# Patient Record
Sex: Female | Born: 1945 | Race: White | Hispanic: No | Smoking: Former smoker
Health system: Southern US, Community
[De-identification: ages and names within clinical notes are randomized; demographics above are authoritative.]

## PROBLEM LIST (undated history)

## (undated) DIAGNOSIS — Z923 Personal history of irradiation: Secondary | ICD-10-CM

## (undated) DIAGNOSIS — I10 Essential (primary) hypertension: Secondary | ICD-10-CM

## (undated) DIAGNOSIS — D126 Benign neoplasm of colon, unspecified: Secondary | ICD-10-CM

## (undated) DIAGNOSIS — E785 Hyperlipidemia, unspecified: Secondary | ICD-10-CM

## (undated) DIAGNOSIS — M8580 Other specified disorders of bone density and structure, unspecified site: Secondary | ICD-10-CM

## (undated) DIAGNOSIS — K219 Gastro-esophageal reflux disease without esophagitis: Secondary | ICD-10-CM

## (undated) DIAGNOSIS — I270 Primary pulmonary hypertension: Secondary | ICD-10-CM

## (undated) DIAGNOSIS — R112 Nausea with vomiting, unspecified: Secondary | ICD-10-CM

## (undated) DIAGNOSIS — I2510 Atherosclerotic heart disease of native coronary artery without angina pectoris: Secondary | ICD-10-CM

## (undated) DIAGNOSIS — I071 Rheumatic tricuspid insufficiency: Secondary | ICD-10-CM

## (undated) DIAGNOSIS — T7840XA Allergy, unspecified, initial encounter: Secondary | ICD-10-CM

## (undated) DIAGNOSIS — Z9889 Other specified postprocedural states: Secondary | ICD-10-CM

## (undated) DIAGNOSIS — Z85820 Personal history of malignant melanoma of skin: Secondary | ICD-10-CM

## (undated) DIAGNOSIS — C50919 Malignant neoplasm of unspecified site of unspecified female breast: Secondary | ICD-10-CM

## (undated) DIAGNOSIS — Z5189 Encounter for other specified aftercare: Secondary | ICD-10-CM

## (undated) DIAGNOSIS — K317 Polyp of stomach and duodenum: Secondary | ICD-10-CM

## (undated) DIAGNOSIS — M1990 Unspecified osteoarthritis, unspecified site: Secondary | ICD-10-CM

## (undated) HISTORY — DX: Hyperlipidemia, unspecified: E78.5

## (undated) HISTORY — DX: Rheumatic tricuspid insufficiency: I07.1

## (undated) HISTORY — PX: OTHER SURGICAL HISTORY: SHX169

## (undated) HISTORY — DX: Primary pulmonary hypertension: I27.0

## (undated) HISTORY — DX: Encounter for other specified aftercare: Z51.89

## (undated) HISTORY — PX: TONSILLECTOMY AND ADENOIDECTOMY: SUR1326

## (undated) HISTORY — PX: TUBAL LIGATION: SHX77

## (undated) HISTORY — DX: Allergy, unspecified, initial encounter: T78.40XA

## (undated) HISTORY — DX: Unspecified osteoarthritis, unspecified site: M19.90

## (undated) HISTORY — DX: Polyp of stomach and duodenum: K31.7

## (undated) HISTORY — DX: Gastro-esophageal reflux disease without esophagitis: K21.9

## (undated) HISTORY — DX: Malignant neoplasm of unspecified site of unspecified female breast: C50.919

## (undated) HISTORY — PX: EYE SURGERY: SHX253

## (undated) HISTORY — PX: WISDOM TOOTH EXTRACTION: SHX21

## (undated) HISTORY — DX: Other specified disorders of bone density and structure, unspecified site: M85.80

## (undated) HISTORY — DX: Personal history of malignant melanoma of skin: Z85.820

## (undated) HISTORY — DX: Benign neoplasm of colon, unspecified: D12.6

## (undated) HISTORY — DX: Essential (primary) hypertension: I10

---
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
## 1988-09-12 DIAGNOSIS — T7840XA Allergy, unspecified, initial encounter: Secondary | ICD-10-CM

## 1988-09-12 HISTORY — DX: Allergy, unspecified, initial encounter: T78.40XA

## 1988-09-12 HISTORY — PX: ABDOMINAL HYSTERECTOMY: SHX81

## 1988-09-12 HISTORY — PX: BLADDER SUSPENSION: SHX72

## 1999-09-13 HISTORY — PX: MENISCUS REPAIR: SHX5179

## 2010-09-12 DIAGNOSIS — C439 Malignant melanoma of skin, unspecified: Secondary | ICD-10-CM

## 2010-09-12 HISTORY — DX: Malignant melanoma of skin, unspecified: C43.9

## 2014-09-12 HISTORY — PX: COLONOSCOPY: SHX174

## 2015-02-24 DIAGNOSIS — D229 Melanocytic nevi, unspecified: Secondary | ICD-10-CM

## 2015-02-24 HISTORY — DX: Melanocytic nevi, unspecified: D22.9

## 2016-10-05 DIAGNOSIS — M79604 Pain in right leg: Secondary | ICD-10-CM | POA: Diagnosis not present

## 2016-10-05 DIAGNOSIS — M25569 Pain in unspecified knee: Secondary | ICD-10-CM | POA: Diagnosis not present

## 2016-10-05 DIAGNOSIS — M1711 Unilateral primary osteoarthritis, right knee: Secondary | ICD-10-CM | POA: Diagnosis not present

## 2016-10-05 DIAGNOSIS — M179 Osteoarthritis of knee, unspecified: Secondary | ICD-10-CM | POA: Diagnosis not present

## 2016-10-10 DIAGNOSIS — Z1231 Encounter for screening mammogram for malignant neoplasm of breast: Secondary | ICD-10-CM | POA: Diagnosis not present

## 2016-12-12 DIAGNOSIS — I2510 Atherosclerotic heart disease of native coronary artery without angina pectoris: Secondary | ICD-10-CM | POA: Diagnosis not present

## 2016-12-19 DIAGNOSIS — I272 Pulmonary hypertension, unspecified: Secondary | ICD-10-CM | POA: Diagnosis not present

## 2016-12-19 DIAGNOSIS — E782 Mixed hyperlipidemia: Secondary | ICD-10-CM | POA: Diagnosis not present

## 2016-12-19 DIAGNOSIS — I10 Essential (primary) hypertension: Secondary | ICD-10-CM | POA: Diagnosis not present

## 2016-12-28 DIAGNOSIS — M25571 Pain in right ankle and joints of right foot: Secondary | ICD-10-CM | POA: Diagnosis not present

## 2017-01-11 DIAGNOSIS — M25571 Pain in right ankle and joints of right foot: Secondary | ICD-10-CM | POA: Diagnosis not present

## 2017-02-21 DIAGNOSIS — L738 Other specified follicular disorders: Secondary | ICD-10-CM | POA: Diagnosis not present

## 2017-02-21 DIAGNOSIS — Z85820 Personal history of malignant melanoma of skin: Secondary | ICD-10-CM | POA: Diagnosis not present

## 2017-02-21 DIAGNOSIS — L570 Actinic keratosis: Secondary | ICD-10-CM | POA: Diagnosis not present

## 2017-02-21 DIAGNOSIS — D1801 Hemangioma of skin and subcutaneous tissue: Secondary | ICD-10-CM | POA: Diagnosis not present

## 2017-02-21 DIAGNOSIS — L821 Other seborrheic keratosis: Secondary | ICD-10-CM | POA: Diagnosis not present

## 2017-02-21 DIAGNOSIS — D2339 Other benign neoplasm of skin of other parts of face: Secondary | ICD-10-CM | POA: Diagnosis not present

## 2017-03-06 DIAGNOSIS — K219 Gastro-esophageal reflux disease without esophagitis: Secondary | ICD-10-CM | POA: Diagnosis not present

## 2017-03-06 DIAGNOSIS — I10 Essential (primary) hypertension: Secondary | ICD-10-CM | POA: Diagnosis not present

## 2017-03-06 DIAGNOSIS — E785 Hyperlipidemia, unspecified: Secondary | ICD-10-CM | POA: Diagnosis not present

## 2017-03-06 DIAGNOSIS — Z283 Underimmunization status: Secondary | ICD-10-CM | POA: Diagnosis not present

## 2017-03-06 DIAGNOSIS — R739 Hyperglycemia, unspecified: Secondary | ICD-10-CM | POA: Diagnosis not present

## 2017-03-09 DIAGNOSIS — M25561 Pain in right knee: Secondary | ICD-10-CM | POA: Diagnosis not present

## 2017-03-09 DIAGNOSIS — M1711 Unilateral primary osteoarthritis, right knee: Secondary | ICD-10-CM | POA: Diagnosis not present

## 2017-05-09 DIAGNOSIS — H2513 Age-related nuclear cataract, bilateral: Secondary | ICD-10-CM | POA: Diagnosis not present

## 2017-05-09 DIAGNOSIS — H43811 Vitreous degeneration, right eye: Secondary | ICD-10-CM | POA: Diagnosis not present

## 2017-05-09 DIAGNOSIS — H33311 Horseshoe tear of retina without detachment, right eye: Secondary | ICD-10-CM | POA: Diagnosis not present

## 2017-05-09 DIAGNOSIS — H31091 Other chorioretinal scars, right eye: Secondary | ICD-10-CM | POA: Diagnosis not present

## 2017-06-20 DIAGNOSIS — E782 Mixed hyperlipidemia: Secondary | ICD-10-CM | POA: Diagnosis not present

## 2017-06-20 DIAGNOSIS — I10 Essential (primary) hypertension: Secondary | ICD-10-CM | POA: Diagnosis not present

## 2017-06-20 DIAGNOSIS — I272 Pulmonary hypertension, unspecified: Secondary | ICD-10-CM | POA: Diagnosis not present

## 2017-06-20 DIAGNOSIS — R600 Localized edema: Secondary | ICD-10-CM | POA: Diagnosis not present

## 2017-06-23 DIAGNOSIS — M25561 Pain in right knee: Secondary | ICD-10-CM | POA: Diagnosis not present

## 2017-06-23 DIAGNOSIS — M1711 Unilateral primary osteoarthritis, right knee: Secondary | ICD-10-CM | POA: Diagnosis not present

## 2017-09-18 DIAGNOSIS — L821 Other seborrheic keratosis: Secondary | ICD-10-CM | POA: Diagnosis not present

## 2017-09-18 DIAGNOSIS — C4492 Squamous cell carcinoma of skin, unspecified: Secondary | ICD-10-CM

## 2017-09-18 DIAGNOSIS — D2271 Melanocytic nevi of right lower limb, including hip: Secondary | ICD-10-CM | POA: Diagnosis not present

## 2017-09-18 DIAGNOSIS — D225 Melanocytic nevi of trunk: Secondary | ICD-10-CM | POA: Diagnosis not present

## 2017-09-18 DIAGNOSIS — M1711 Unilateral primary osteoarthritis, right knee: Secondary | ICD-10-CM | POA: Diagnosis not present

## 2017-09-18 DIAGNOSIS — Z85820 Personal history of malignant melanoma of skin: Secondary | ICD-10-CM | POA: Diagnosis not present

## 2017-09-18 DIAGNOSIS — D485 Neoplasm of uncertain behavior of skin: Secondary | ICD-10-CM | POA: Diagnosis not present

## 2017-09-18 DIAGNOSIS — D2272 Melanocytic nevi of left lower limb, including hip: Secondary | ICD-10-CM | POA: Diagnosis not present

## 2017-09-18 DIAGNOSIS — M25561 Pain in right knee: Secondary | ICD-10-CM | POA: Diagnosis not present

## 2017-09-18 DIAGNOSIS — D0471 Carcinoma in situ of skin of right lower limb, including hip: Secondary | ICD-10-CM | POA: Diagnosis not present

## 2017-09-18 DIAGNOSIS — D1801 Hemangioma of skin and subcutaneous tissue: Secondary | ICD-10-CM | POA: Diagnosis not present

## 2017-09-18 HISTORY — DX: Squamous cell carcinoma of skin, unspecified: C44.92

## 2017-09-22 DIAGNOSIS — M1711 Unilateral primary osteoarthritis, right knee: Secondary | ICD-10-CM | POA: Diagnosis not present

## 2017-09-22 DIAGNOSIS — M25561 Pain in right knee: Secondary | ICD-10-CM | POA: Diagnosis not present

## 2017-09-29 DIAGNOSIS — M25561 Pain in right knee: Secondary | ICD-10-CM | POA: Diagnosis not present

## 2017-09-29 DIAGNOSIS — M1711 Unilateral primary osteoarthritis, right knee: Secondary | ICD-10-CM | POA: Diagnosis not present

## 2017-10-05 DIAGNOSIS — I10 Essential (primary) hypertension: Secondary | ICD-10-CM | POA: Diagnosis not present

## 2017-10-05 DIAGNOSIS — R739 Hyperglycemia, unspecified: Secondary | ICD-10-CM | POA: Diagnosis not present

## 2017-10-05 DIAGNOSIS — Z1231 Encounter for screening mammogram for malignant neoplasm of breast: Secondary | ICD-10-CM | POA: Diagnosis not present

## 2017-10-05 DIAGNOSIS — M2410 Other articular cartilage disorders, unspecified site: Secondary | ICD-10-CM | POA: Diagnosis not present

## 2017-10-05 DIAGNOSIS — E785 Hyperlipidemia, unspecified: Secondary | ICD-10-CM | POA: Diagnosis not present

## 2017-10-05 DIAGNOSIS — M899 Disorder of bone, unspecified: Secondary | ICD-10-CM | POA: Diagnosis not present

## 2017-10-05 DIAGNOSIS — Z6829 Body mass index (BMI) 29.0-29.9, adult: Secondary | ICD-10-CM | POA: Diagnosis not present

## 2017-10-05 DIAGNOSIS — M810 Age-related osteoporosis without current pathological fracture: Secondary | ICD-10-CM | POA: Diagnosis not present

## 2017-10-05 DIAGNOSIS — R7301 Impaired fasting glucose: Secondary | ICD-10-CM | POA: Diagnosis not present

## 2017-10-05 DIAGNOSIS — I272 Pulmonary hypertension, unspecified: Secondary | ICD-10-CM | POA: Diagnosis not present

## 2017-10-06 DIAGNOSIS — M25561 Pain in right knee: Secondary | ICD-10-CM | POA: Diagnosis not present

## 2017-10-06 DIAGNOSIS — M1711 Unilateral primary osteoarthritis, right knee: Secondary | ICD-10-CM | POA: Diagnosis not present

## 2017-10-10 DIAGNOSIS — C44722 Squamous cell carcinoma of skin of right lower limb, including hip: Secondary | ICD-10-CM | POA: Diagnosis not present

## 2017-10-30 DIAGNOSIS — Z13820 Encounter for screening for osteoporosis: Secondary | ICD-10-CM | POA: Diagnosis not present

## 2017-10-30 DIAGNOSIS — M85851 Other specified disorders of bone density and structure, right thigh: Secondary | ICD-10-CM | POA: Diagnosis not present

## 2017-10-30 DIAGNOSIS — Z1231 Encounter for screening mammogram for malignant neoplasm of breast: Secondary | ICD-10-CM | POA: Diagnosis not present

## 2017-10-30 DIAGNOSIS — M85852 Other specified disorders of bone density and structure, left thigh: Secondary | ICD-10-CM | POA: Diagnosis not present

## 2017-10-30 DIAGNOSIS — M2410 Other articular cartilage disorders, unspecified site: Secondary | ICD-10-CM | POA: Diagnosis not present

## 2017-10-30 DIAGNOSIS — Z780 Asymptomatic menopausal state: Secondary | ICD-10-CM | POA: Diagnosis not present

## 2017-10-30 DIAGNOSIS — M810 Age-related osteoporosis without current pathological fracture: Secondary | ICD-10-CM | POA: Diagnosis not present

## 2017-10-30 DIAGNOSIS — M899 Disorder of bone, unspecified: Secondary | ICD-10-CM | POA: Diagnosis not present

## 2017-10-30 LAB — HM DEXA SCAN

## 2017-10-30 LAB — HM MAMMOGRAPHY

## 2017-11-02 DIAGNOSIS — M25561 Pain in right knee: Secondary | ICD-10-CM | POA: Diagnosis not present

## 2017-11-02 DIAGNOSIS — M1711 Unilateral primary osteoarthritis, right knee: Secondary | ICD-10-CM | POA: Diagnosis not present

## 2017-12-07 DIAGNOSIS — I272 Pulmonary hypertension, unspecified: Secondary | ICD-10-CM | POA: Diagnosis not present

## 2017-12-08 DIAGNOSIS — I272 Pulmonary hypertension, unspecified: Secondary | ICD-10-CM | POA: Diagnosis not present

## 2017-12-08 DIAGNOSIS — I361 Nonrheumatic tricuspid (valve) insufficiency: Secondary | ICD-10-CM | POA: Diagnosis not present

## 2017-12-13 DIAGNOSIS — L570 Actinic keratosis: Secondary | ICD-10-CM | POA: Diagnosis not present

## 2017-12-13 DIAGNOSIS — D485 Neoplasm of uncertain behavior of skin: Secondary | ICD-10-CM | POA: Diagnosis not present

## 2017-12-19 DIAGNOSIS — I272 Pulmonary hypertension, unspecified: Secondary | ICD-10-CM | POA: Diagnosis not present

## 2017-12-19 DIAGNOSIS — E782 Mixed hyperlipidemia: Secondary | ICD-10-CM | POA: Diagnosis not present

## 2017-12-19 DIAGNOSIS — I10 Essential (primary) hypertension: Secondary | ICD-10-CM | POA: Diagnosis not present

## 2018-02-19 DIAGNOSIS — M79671 Pain in right foot: Secondary | ICD-10-CM | POA: Diagnosis not present

## 2018-05-08 DIAGNOSIS — M722 Plantar fascial fibromatosis: Secondary | ICD-10-CM | POA: Diagnosis not present

## 2018-05-08 DIAGNOSIS — M7732 Calcaneal spur, left foot: Secondary | ICD-10-CM | POA: Diagnosis not present

## 2018-05-15 DIAGNOSIS — M67471 Ganglion, right ankle and foot: Secondary | ICD-10-CM | POA: Diagnosis not present

## 2018-05-15 DIAGNOSIS — M722 Plantar fascial fibromatosis: Secondary | ICD-10-CM | POA: Diagnosis not present

## 2018-05-28 DIAGNOSIS — M722 Plantar fascial fibromatosis: Secondary | ICD-10-CM | POA: Diagnosis not present

## 2018-06-06 DIAGNOSIS — M722 Plantar fascial fibromatosis: Secondary | ICD-10-CM | POA: Diagnosis not present

## 2018-07-25 DIAGNOSIS — L820 Inflamed seborrheic keratosis: Secondary | ICD-10-CM | POA: Diagnosis not present

## 2018-07-25 DIAGNOSIS — D485 Neoplasm of uncertain behavior of skin: Secondary | ICD-10-CM | POA: Diagnosis not present

## 2018-07-25 DIAGNOSIS — Z85820 Personal history of malignant melanoma of skin: Secondary | ICD-10-CM | POA: Diagnosis not present

## 2018-07-25 DIAGNOSIS — L821 Other seborrheic keratosis: Secondary | ICD-10-CM | POA: Diagnosis not present

## 2018-07-25 DIAGNOSIS — H31091 Other chorioretinal scars, right eye: Secondary | ICD-10-CM | POA: Diagnosis not present

## 2018-07-25 DIAGNOSIS — H2513 Age-related nuclear cataract, bilateral: Secondary | ICD-10-CM | POA: Diagnosis not present

## 2018-07-25 DIAGNOSIS — D235 Other benign neoplasm of skin of trunk: Secondary | ICD-10-CM | POA: Diagnosis not present

## 2018-07-25 DIAGNOSIS — D225 Melanocytic nevi of trunk: Secondary | ICD-10-CM | POA: Diagnosis not present

## 2018-07-25 DIAGNOSIS — H43811 Vitreous degeneration, right eye: Secondary | ICD-10-CM | POA: Diagnosis not present

## 2018-07-25 DIAGNOSIS — H33311 Horseshoe tear of retina without detachment, right eye: Secondary | ICD-10-CM | POA: Diagnosis not present

## 2018-07-25 DIAGNOSIS — D1801 Hemangioma of skin and subcutaneous tissue: Secondary | ICD-10-CM | POA: Diagnosis not present

## 2018-07-26 DIAGNOSIS — K219 Gastro-esophageal reflux disease without esophagitis: Secondary | ICD-10-CM | POA: Diagnosis not present

## 2018-07-26 DIAGNOSIS — Z1231 Encounter for screening mammogram for malignant neoplasm of breast: Secondary | ICD-10-CM | POA: Diagnosis not present

## 2018-07-26 DIAGNOSIS — I272 Pulmonary hypertension, unspecified: Secondary | ICD-10-CM | POA: Diagnosis not present

## 2018-07-26 DIAGNOSIS — R739 Hyperglycemia, unspecified: Secondary | ICD-10-CM | POA: Diagnosis not present

## 2018-07-26 DIAGNOSIS — R7301 Impaired fasting glucose: Secondary | ICD-10-CM | POA: Diagnosis not present

## 2018-07-26 DIAGNOSIS — E785 Hyperlipidemia, unspecified: Secondary | ICD-10-CM | POA: Diagnosis not present

## 2018-07-27 LAB — HEMOGLOBIN A1C: Hemoglobin A1C: 5.5

## 2018-07-27 LAB — LIPID PANEL
CHOLESTEROL: 196 (ref 0–200)
HDL: 65 (ref 35–70)
LDL Cholesterol: 101
Triglycerides: 152 (ref 40–160)

## 2018-07-27 LAB — BASIC METABOLIC PANEL
BUN: 15 (ref 4–21)
Creatinine: 0.7 (ref 0.5–1.1)
Glucose: 96
Potassium: 4.5 (ref 3.4–5.3)
Sodium: 142 (ref 137–147)

## 2018-07-27 LAB — TSH: TSH: 1.46 (ref 0.41–5.90)

## 2018-08-27 ENCOUNTER — Encounter: Payer: Self-pay | Admitting: *Deleted

## 2018-09-27 ENCOUNTER — Encounter: Payer: Self-pay | Admitting: *Deleted

## 2018-10-02 ENCOUNTER — Encounter: Payer: Self-pay | Admitting: Family Medicine

## 2018-10-02 ENCOUNTER — Ambulatory Visit (INDEPENDENT_AMBULATORY_CARE_PROVIDER_SITE_OTHER): Payer: Medicare Other | Admitting: Family Medicine

## 2018-10-02 VITALS — BP 128/68 | HR 66 | Temp 98.1°F | Resp 16 | Ht 61.0 in | Wt 158.6 lb

## 2018-10-02 DIAGNOSIS — Z85820 Personal history of malignant melanoma of skin: Secondary | ICD-10-CM | POA: Diagnosis not present

## 2018-10-02 DIAGNOSIS — Z1239 Encounter for other screening for malignant neoplasm of breast: Secondary | ICD-10-CM

## 2018-10-02 DIAGNOSIS — I10 Essential (primary) hypertension: Secondary | ICD-10-CM | POA: Diagnosis not present

## 2018-10-02 DIAGNOSIS — M150 Primary generalized (osteo)arthritis: Secondary | ICD-10-CM | POA: Diagnosis not present

## 2018-10-02 DIAGNOSIS — M810 Age-related osteoporosis without current pathological fracture: Secondary | ICD-10-CM | POA: Insufficient documentation

## 2018-10-02 DIAGNOSIS — D126 Benign neoplasm of colon, unspecified: Secondary | ICD-10-CM | POA: Diagnosis not present

## 2018-10-02 DIAGNOSIS — I270 Primary pulmonary hypertension: Secondary | ICD-10-CM | POA: Diagnosis not present

## 2018-10-02 DIAGNOSIS — M8580 Other specified disorders of bone density and structure, unspecified site: Secondary | ICD-10-CM | POA: Diagnosis not present

## 2018-10-02 DIAGNOSIS — E782 Mixed hyperlipidemia: Secondary | ICD-10-CM | POA: Insufficient documentation

## 2018-10-02 DIAGNOSIS — M159 Polyosteoarthritis, unspecified: Secondary | ICD-10-CM | POA: Insufficient documentation

## 2018-10-02 DIAGNOSIS — K219 Gastro-esophageal reflux disease without esophagitis: Secondary | ICD-10-CM | POA: Diagnosis not present

## 2018-10-02 DIAGNOSIS — K317 Polyp of stomach and duodenum: Secondary | ICD-10-CM

## 2018-10-02 HISTORY — DX: Polyp of stomach and duodenum: K31.7

## 2018-10-02 HISTORY — DX: Personal history of malignant melanoma of skin: Z85.820

## 2018-10-02 HISTORY — DX: Primary pulmonary hypertension: I27.0

## 2018-10-02 HISTORY — DX: Benign neoplasm of colon, unspecified: D12.6

## 2018-10-02 HISTORY — DX: Gastro-esophageal reflux disease without esophagitis: K21.9

## 2018-10-02 NOTE — Patient Instructions (Signed)
Please return in May or June for follow up of your hypertension.  We will call you with information regarding your referral appointment. Dermatology and Cardiology and mammography.  If you do not hear from [place] within the next 2 weeks, please let me know. It can take 1-2 weeks to get appointments set up with the specialists.   It was a pleasure meeting you today! Thank you for choosing [place] to meet your healthcare needs! I truly look forward to working with you. If you have any questions or concerns, please send me a message via Mychart or call the office at [phone].

## 2018-10-02 NOTE — Progress Notes (Signed)
Subjective  CC:  Chief Complaint  Patient presents with  . Establish Care    Recently moved from [facility], [place], last CPE was 07/2018    HPI: Madison Clements is a 73 y.o. female who presents to [place] at [facility] today to establish care with me as a new patient.  Reviewed records from former PCP. See scan.  She has the following concerns or needs:  Osteopenia by dexa: on calcium and vit D. T  = -1.4lowest.   HM: nl mammogram and recent blood work.   HTN: Feeling well. Taking medications w/o adverse effects. No symptoms of CHF, angina; no palpitations, sob, cp or lower extremity edema. Compliant with meds.   HLD on statin with well controlled ldl. No myalgias.   Last a1c 5.8.   Diagnosed with pulmonary hypertension about 10 years ago.  Has been followed by cardiology who does routine echocardiograms.  Also has seen pulmonology but has not had any respiratory symptoms due to her pulmonary hypertension.  She is on Lasix for this.  She does need referrals to specialist.  History of melanoma x2: Due for annual surveillance and needs a referral.  History of retinal tear.  Due for annual eye exam soon.  No vision disturbance and since.  GERD on chronic PPIs.  Uses Celebrex for osteoarthritis.  Colon cancer screening is up-to-date, he did have history of adenomatous colon polyp due for rescreening in 2021.  Has history of benign colon polyps and gastric polyps.  Assessment  1. Essential hypertension   2. Screening for breast cancer   3. Primary osteoarthritis involving multiple joints   4. Osteopenia, unspecified location   5. Mixed hyperlipidemia   6. Gastroesophageal reflux disease without esophagitis   7. Pulmonary hypertension, primary ([place])   8. History of melanoma   9. Adenomatous polyp of colon, unspecified part of colon   10. Gastric polyps      Plan   Chronic medical problems outlined above and in problem list.  All are currently well managed  and controlled.  Recent lab work reviewed.  Nothing needed and no medication changes at this time.  Recheck blood pressure in the next 4 to 6 months.  Osteopenia: Continue calcium and vitamin D  Health maintenance: Due for mammogram and eye exam.  Patient to set up  History of melanoma: Refer to dermatology  Pulmonary hypertension: Referred to cardiology.  May need pulmonology in the future as well.  Currently stable.  Continue Celebrex for osteoarthritis.  Follow up:  Return in about 5 months (around 03/03/2019). Orders Placed This Encounter  Procedures  . DG Bone Density  . MM DIGITAL SCREENING BILATERAL  . Ambulatory referral to Cardiology  . Ambulatory referral to Dermatology   No orders of the defined types were placed in this encounter.    No flowsheet data found.  We updated and reviewed the patient's past history in detail and it is documented below.  Patient Active Problem List   Diagnosis Date Noted  . Essential hypertension 10/02/2018    Priority: High  . History of melanoma 10/02/2018    Priority: High    Abdomen 2012; 2013; s/p local excisions.    Marland Kitchen GERD (gastroesophageal reflux disease) 10/02/2018    Priority: Medium  . Mixed hyperlipidemia 10/02/2018  . Osteopenia 10/02/2018    Dexa 2019: T = -1.4 lowest; recheck 2 years.    . Primary osteoarthritis involving multiple joints 10/02/2018    Daily celebrex; bilateral knees, h/o  right meniscal repair; has had viscosupplementation and steroid injections in the past.  Bilateral hands,no back problems   . Pulmonary hypertension, primary ([place]) 10/02/2018    Diagnosed around 2010; has seen pulm and cards in the past.  Left heart catheterization in 2016 - minimal vascular disease   . Polyp of colon, adenomatous 10/02/2018    Colonoscopy 03/2015; recheck in 2021   . Gastric polyps 10/02/2018    EGD 03/2015; benign; done for dysphagia    Health Maintenance  Topic Date Due  . MAMMOGRAM  10/30/2018  . DEXA  SCAN  10/31/2019  . COLONOSCOPY  03/12/2020  . Hepatitis C Screening  Completed  . PNA vac Low Risk Adult  Completed   Immunization History  Administered Date(s) Administered  . Pneumococcal Conjugate-13 09/12/2014  . Pneumococcal Polysaccharide-23 09/12/2009   Current Meds  Medication Sig  . aspirin EC 81 MG tablet Take 81 mg by mouth daily.  Marland Kitchen atorvastatin (LIPITOR) 40 MG tablet Take 1 tablet by mouth daily.  . celecoxib (CELEBREX) 100 MG capsule Take 1 capsule by mouth daily.  . Cholecalciferol (VITAMIN D3) 50 MCG (2000 UT) TABS Take 1 tablet by mouth every other day.  . diltiazem (TIAZAC) 180 MG 24 hr capsule Take 1 capsule by mouth daily.  . folic acid (FOLVITE) 161 MCG tablet Take 400 mcg by mouth daily.  . furosemide (LASIX) 40 MG tablet Take 1 tablet by mouth daily.  Marland Kitchen lisinopril (PRINIVIL,ZESTRIL) 10 MG tablet Take 1 tablet by mouth daily.  Marland Kitchen omeprazole (PRILOSEC) 20 MG capsule Take 1 capsule by mouth daily.  . Probiotic Product (ADVANCED PROBIOTIC PO) Take 1 capsule by mouth daily.  Marland Kitchen Specialty Vitamins Products (ONE-A-DAY BONE STRENGTH PO) Take 1 tablet by mouth every other day.    Allergies: Patient is allergic to morphine and related. Past Medical History Patient  has a past medical history of Arthritis, Gastric polyps (10/02/2018), GERD (gastroesophageal reflux disease) (10/02/2018), History of melanoma (10/02/2018), Hyperlipidemia, Hypertension, Osteopenia, Polyp of colon, adenomatous (10/02/2018), and Pulmonary hypertension, primary ([place]) (10/02/2018). Past Surgical History Patient  has a past surgical history that includes Meniscus repair (2001) and Appendectomy (1962). Family History: Patient family history is not on file. Social History:  Patient  reports that she has never smoked. She has never used smokeless tobacco.  Review of Systems: Constitutional: negative for fever or malaise Ophthalmic: negative for photophobia, double vision or loss of  vision Cardiovascular: negative for chest pain, dyspnea on exertion, or new LE swelling Respiratory: negative for SOB or persistent cough Gastrointestinal: negative for abdominal pain, change in bowel habits or melena Genitourinary: negative for dysuria or gross hematuria Musculoskeletal: negative for new gait disturbance or muscular weakness Integumentary: negative for new or persistent rashes Neurological: negative for TIA or stroke symptoms Psychiatric: negative for SI or delusions Allergic/Immunologic: negative for hives  Patient Care Team    Relationship Specialty Notifications Start End  Leamon Arnt, MD PCP - General Family Medicine  10/02/18     Objective  Vitals: BP 128/68   Pulse 66   Temp 98.1 F (36.7 C) (Oral)   Resp 16   Ht 5\' 1"  (1.549 m)   Wt 158 lb 9.6 oz (71.9 kg)   SpO2 98%   BMI 29.97 kg/m  General:  Well developed, well nourished, no acute distress  Psych:  Alert and oriented,normal mood and affect HEENT:  Normocephalic, atraumatic, non-icteric sclera, PERRL, oropharynx is without mass or exudate, supple neck without adenopathy, mass or thyromegaly Cardiovascular:  RRR without gallop, rub or murmur, nondisplaced PMI Respiratory:  Good breath sounds bilaterally, CTAB with normal respiratory effort MSK: no deformities, contusions. Joints are without erythema or swelling Skin:  Warm, no rashes or suspicious lesions noted Neurologic:    Mental status is normal. Gross motor and sensory exams are normal. Normal gait   Commons side effects, risks, benefits, and alternatives for medications and treatment plan prescribed today were discussed, and the patient expressed understanding of the given instructions. Patient is instructed to call or message via MyChart if he/she has any questions or concerns regarding our treatment plan. No barriers to understanding were identified. We discussed Red Flag symptoms and signs in detail. Patient expressed understanding regarding  what to do in case of urgent or emergency type symptoms.   Medication list was reconciled, printed and provided to the patient in AVS. Patient instructions and summary information was reviewed with the patient as documented in the AVS. This note was prepared with assistance of Dragon voice recognition software. Occasional wrong-word or sound-a-like substitutions may have occurred due to the inherent limitations of voice recognition software

## 2018-11-06 ENCOUNTER — Ambulatory Visit
Admission: RE | Admit: 2018-11-06 | Discharge: 2018-11-06 | Disposition: A | Discharge status: Home / Self Care | Payer: Medicare Other | Source: Ambulatory Visit | Attending: Family Medicine | Admitting: Family Medicine

## 2018-11-06 DIAGNOSIS — Z1239 Encounter for other screening for malignant neoplasm of breast: Secondary | ICD-10-CM

## 2018-11-06 DIAGNOSIS — Z1231 Encounter for screening mammogram for malignant neoplasm of breast: Secondary | ICD-10-CM | POA: Diagnosis not present

## 2019-01-07 ENCOUNTER — Other Ambulatory Visit: Payer: Self-pay | Admitting: Family Medicine

## 2019-01-07 MED ORDER — FUROSEMIDE 40 MG PO TABS: 40.0000 mg | ORAL_TABLET | Freq: Every day | ORAL | 0 refills | Status: DC

## 2019-01-07 MED ORDER — CELECOXIB 100 MG PO CAPS: 100.0000 mg | ORAL_CAPSULE | Freq: Every day | ORAL | 0 refills | Status: DC

## 2019-01-07 NOTE — Telephone Encounter (Signed)
Requested medication (s) are due for refill today: amount dispensed not specified  Requested medication (s) are on the active medication list: yes- historical medication for both medications  Last refill:  10/02/18 for both meds requested  Future visit scheduled: yes  Notes to clinic:  Historical medication and historical provider   Requested Prescriptions  Pending Prescriptions Disp Refills   celecoxib (CELEBREX) 100 MG capsule      Sig: Take 1 capsule (100 mg total) by mouth daily.     Analgesics:  COX2 Inhibitors Failed - 01/07/2019  3:08 PM      Failed - HGB in normal range and within 360 days    No results found for: HGB, HGBKUC, HGBPOCKUC       Passed - Cr in normal range and within 360 days    Creatinine  Date Value Ref Range Status  07/27/2018 0.7 0.5 - 1.1 Final         Passed - Patient is not pregnant      Passed - Valid encounter within last 12 months    Recent Outpatient Visits          3 months ago Essential hypertension   [facility] [place], [place]      Future Appointments            In 1 month Leamon Arnt, MD [place], [facility]          furosemide (LASIX) 40 MG tablet 30 tablet     Sig: Take 1 tablet (40 mg total) by mouth daily.     Cardiovascular:  Diuretics - Loop Failed - 01/07/2019  3:08 PM      Failed - Ca in normal range and within 360 days    No results found for: CALCIUM, CORRECTEDCA, CAWHOLEBLD, POCCA       Passed - K in normal range and within 360 days    Potassium  Date Value Ref Range Status  07/27/2018 4.5 3.4 - 5.3 Final         Passed - Na in normal range and within 360 days    Sodium  Date Value Ref Range Status  07/27/2018 142 137 - 147 Final         Passed - Cr in normal range and within 360 days    Creatinine  Date Value Ref Range Status  07/27/2018 0.7 0.5 - 1.1 Final         Passed - Last BP in normal range    BP Readings from Last 1  Encounters:  10/02/18 128/68         Passed - Valid encounter within last 6 months    Recent Outpatient Visits          3 months ago Essential hypertension   [facility] [place], Karie Fetch, MD      Future Appointments            In 1 month Leamon Arnt, MD [place], [place]

## 2019-01-07 NOTE — Telephone Encounter (Signed)
See note

## 2019-01-07 NOTE — Telephone Encounter (Signed)
Medications have not been filled by you, she is scheduled to f/u in June

## 2019-01-22 DIAGNOSIS — D225 Melanocytic nevi of trunk: Secondary | ICD-10-CM | POA: Diagnosis not present

## 2019-01-22 DIAGNOSIS — D229 Melanocytic nevi, unspecified: Secondary | ICD-10-CM | POA: Diagnosis not present

## 2019-01-22 DIAGNOSIS — D485 Neoplasm of uncertain behavior of skin: Secondary | ICD-10-CM | POA: Diagnosis not present

## 2019-01-22 DIAGNOSIS — Z85820 Personal history of malignant melanoma of skin: Secondary | ICD-10-CM | POA: Diagnosis not present

## 2019-01-22 DIAGNOSIS — D224 Melanocytic nevi of scalp and neck: Secondary | ICD-10-CM | POA: Diagnosis not present

## 2019-02-05 ENCOUNTER — Telehealth: Payer: Self-pay | Admitting: Cardiology

## 2019-02-05 ENCOUNTER — Telehealth: Payer: Self-pay

## 2019-02-05 NOTE — Telephone Encounter (Signed)
Called and left message for patient to call back. Need to change upcoming appointment with Dr Radford Pax into a virtual visit and get verbal consent.

## 2019-02-05 NOTE — Telephone Encounter (Signed)
New Message ° ° °Patient returning your call. °

## 2019-02-07 ENCOUNTER — Other Ambulatory Visit: Payer: Self-pay

## 2019-02-07 ENCOUNTER — Encounter: Payer: Self-pay | Admitting: Cardiology

## 2019-02-07 ENCOUNTER — Telehealth (INDEPENDENT_AMBULATORY_CARE_PROVIDER_SITE_OTHER): Payer: Medicare Other | Admitting: Cardiology

## 2019-02-07 VITALS — BP 119/80 | HR 65 | Ht 61.0 in | Wt 157.0 lb

## 2019-02-07 DIAGNOSIS — I10 Essential (primary) hypertension: Secondary | ICD-10-CM

## 2019-02-07 DIAGNOSIS — I2510 Atherosclerotic heart disease of native coronary artery without angina pectoris: Secondary | ICD-10-CM | POA: Diagnosis not present

## 2019-02-07 DIAGNOSIS — I270 Primary pulmonary hypertension: Secondary | ICD-10-CM

## 2019-02-07 DIAGNOSIS — Z7189 Other specified counseling: Secondary | ICD-10-CM

## 2019-02-07 DIAGNOSIS — E782 Mixed hyperlipidemia: Secondary | ICD-10-CM

## 2019-02-07 HISTORY — DX: Atherosclerotic heart disease of native coronary artery without angina pectoris: I25.10

## 2019-02-07 NOTE — Progress Notes (Signed)
Virtual Visit via Video Note   This visit type was conducted due to national recommendations for restrictions regarding the COVID-19 Pandemic (e.g. social distancing) in an effort to limit this patient's exposure and mitigate transmission in our community.  Due to her co-morbid illnesses, this patient is at least at moderate risk for complications without adequate follow up.  This format is felt to be most appropriate for this patient at this time.  All issues noted in this document were discussed and addressed.  A limited physical exam was performed with this format.  Please refer to the patient's chart for her consent to telehealth for [facility].  Evaluation Performed:  Follow-up visit  This visit type was conducted due to national recommendations for restrictions regarding the COVID-19 Pandemic (e.g. social distancing).  This format is felt to be most appropriate for this patient at this time.  All issues noted in this document were discussed and addressed.  No physical exam was performed (except for noted visual exam findings with Video Visits).  Please refer to the patient's chart (MyChart message for video visits and phone note for telephone visits) for the patient's consent to telehealth for [facility].  Date:  02/07/2019   ID:  Madison Clements, DOB [birth year], MRN [id number]  Patient Location:  Home  Provider location:   [place]  PCP:  Leamon Arnt, MD  Cardiologist:  NEW Electrophysiologist:  None   Chief Complaint:  HTN/Pulmonary HTN  History of Present Illness:    Madison Clements is a 73 y.o. female who presents via audio/video conferencing for a telehealth visit today, in referral by Billey Chang, MD for evaluation of hypertension.  This is a 73 year old female with a history of hyperlipidemia, GERD, hypertension and pulmonary hypertension.  She had an echocardiogram done and 2018 in [place] which showed normal LV function with the EF 55 to  26%, grade 2 diastolic dysfunction with mild to moderate TR and mild pulmonary hypertension with RVSP 39 mmHg.  Repeat 2D echo 1 year later was unchanged.  She has been on Lasix 40 mg daily.  She had a nuclear stress test done 2015 that showed no ischemia.  Cardiac catheterization done in 2016 showed mild CAD with calcified vessels.  I do not have the full report of this.  She has been referred to me to establish cardiac care.  She is here today for followup and is doing well.  She denies any chest pain or pressure, SOB, DOE, PND, orthopnea, LE edema (except when she travels or sits a lot), dizziness, palpitations or syncope. She is compliant with her meds and is tolerating meds with no SE.    The patient does not have symptoms concerning for COVID-19 infection (fever, chills, cough, or new shortness of breath).    Prior CV studies:   The following studies were reviewed today:  2D echo , cardiac cath report  Past Medical History:  Diagnosis Date  . Arthritis   . CAD (coronary artery disease), native coronary artery 02/07/2019   Minimal CAD at the time of cath in 2016 in [facility]  . Gastric polyps 10/02/2018   EGD 03/2015; benign  . GERD (gastroesophageal reflux disease) 10/02/2018  . History of melanoma 10/02/2018   Abdomen 2012; 2013; s/p local excisions.   . Hyperlipidemia   . Hypertension   . Osteopenia   . Polyp of colon, adenomatous 10/02/2018   Colonoscopy 03/2015; recheck in 2021  . Pulmonary hypertension, primary ([place]) 10/02/2018  Past Surgical History:  Procedure Laterality Date  . ABDOMINAL HYSTERECTOMY    . APPENDECTOMY  1962  . BLADDER SUSPENSION    . MENISCUS REPAIR  2001  . thumb surgery       Current Meds  Medication Sig  . aspirin EC 81 MG tablet Take 81 mg by mouth daily.  Marland Kitchen atorvastatin (LIPITOR) 40 MG tablet Take 1 tablet by mouth daily.  . celecoxib (CELEBREX) 100 MG capsule Take 1 capsule (100 mg total) by mouth daily.  . Cholecalciferol  (VITAMIN D3) 50 MCG (2000 UT) TABS Take 1 tablet by mouth every other day.  . diltiazem (TIAZAC) 180 MG 24 hr capsule Take 1 capsule by mouth daily.  . folic acid (FOLVITE) 024 MCG tablet Take 400 mcg by mouth daily.  . furosemide (LASIX) 40 MG tablet Take 1 tablet (40 mg total) by mouth daily.  Marland Kitchen lisinopril (PRINIVIL,ZESTRIL) 10 MG tablet Take 1 tablet by mouth daily.  Marland Kitchen omeprazole (PRILOSEC) 20 MG capsule Take 1 capsule by mouth daily.  . Probiotic Product (ADVANCED PROBIOTIC PO) Take 1 capsule by mouth daily.  Marland Kitchen Specialty Vitamins Products (ONE-A-DAY BONE STRENGTH PO) Take 1 tablet by mouth every other day.     Allergies:   Morphine and Morphine and related   Social History   Tobacco Use  . Smoking status: Never Smoker  . Smokeless tobacco: Never Used  Substance Use Topics  . Alcohol use: Not on file  . Drug use: Not on file     Family Hx: The patient's family history includes Arthritis in her mother, paternal grandmother, and sister; Asthma in her maternal grandfather; Cancer in her sister; Diabetes in her sister; Early death in her maternal grandmother; Heart attack in her father; Heart disease in her father and paternal grandmother; High Cholesterol in her sister; High blood pressure in her father, maternal grandmother, mother, paternal grandmother, and sister; Kidney disease in her maternal grandfather and paternal grandfather.  ROS:   Please see the history of present illness.     All other systems reviewed and are negative.   Labs/Other Tests and Data Reviewed:    Recent Labs: 07/27/2018: BUN 15; Creatinine 0.7; Potassium 4.5; Sodium 142; TSH 1.46   Recent Lipid Panel Lab Results  Component Value Date/Time   CHOL 196 07/27/2018   TRIG 152 07/27/2018   HDL 65 07/27/2018   LDLCALC 101 07/27/2018    Wt Readings from Last 3 Encounters:  02/07/19 157 lb (71.2 kg)  10/02/18 158 lb 9.6 oz (71.9 kg)     Objective:    Vital Signs:  BP 119/80   Pulse 65   Ht 5\' 1"   (1.549 m)   Wt 157 lb (71.2 kg)   SpO2 97%   BMI 29.66 kg/m    CONSTITUTIONAL:  Well nourished, well developed female in no acute distress.  EYES: anicteric MOUTH: oral mucosa is pink RESPIRATORY: Normal respiratory effort, symmetric expansion CARDIOVASCULAR: No peripheral edema SKIN: No rash, lesions or ulcers MUSCULOSKELETAL: no digital cyanosis NEURO: Cranial Nerves II-XII grossly intact, moves all extremities PSYCH: Intact judgement and insight.  A&O x 3, Mood/affect appropriate   ASSESSMENT & PLAN:    1.  Mild pulmonary hypertension -she has had 2 echoes done in 2018 2019 both showing mild pulmonary hypertension with PASP 19 mmHg.  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  She is on chronic diuretics with Lasix 40 mg daily.  Her creatinine was stable at 0.7  07/27/2018.  I will repeat a 2D echocardiogram to make sure her PA pressures are stable.  2.  Minimal CAD - this was done in 2016 in [facility].  Nuclear stress test for coronary artery calcifications noted on chest CT year earlier was normal.  She has not had any anginal chest pain.  She will continue on aspirin 81 mg daily Lipitor 40 mg daily.  3.  Hyperlipidemia - her last LDL was 101 07/27/2018.  She will continue on Lipitor 40 mg daily.  Her LDL goal is less than 70.  She is supposed to get a repeat FLP and ALT next week with her PCP.  4.  Hypertension -her blood pressure is controlled on exam.  She will continue on lisinopril 10 mg daily and Cardizem CD 180 mg daily.  5.  COVID-19 Education:The signs and symptoms of COVID-19 were discussed with the patient and how to seek care for testing (follow up with PCP or arrange E-visit).  The importance of social distancing was discussed today.  Patient Risk:   After full review of this patient's clinical status, I feel that they are at least moderate risk at this time.  Time:   Today, I have spent 12 minutes directly  with the patient on video discussing medical problems including CAD, pulmonary HTN, HTN, lipids.  We also reviewed the symptoms of COVID 19 and the ways to protect against contracting the virus with telehealth technology.  I spent an additional 5 minutes reviewing patient's chart including 2D echo and cath, labs.  Medication Adjustments/Labs and Tests Ordered: Current medicines are reviewed at length with the patient today.  Concerns regarding medicines are outlined above.  Tests Ordered: No orders of the defined types were placed in this encounter.  Medication Changes: No orders of the defined types were placed in this encounter.   Disposition:  Follow up in 1 year(s)  Signed, Fransico Him, MD  02/07/2019 10:26 AM    [facility]

## 2019-02-07 NOTE — Patient Instructions (Signed)
Medication Instructions:  Your physician recommends that you continue on your current medications as directed. Please refer to the Current Medication list given to you today.  If you need a refill on your cardiac medications before your next appointment, please call your pharmacy.   Lab work: None If you have labs (blood work) drawn today and your tests are completely normal, you will receive your results only by: Marland Kitchen MyChart Message (if you have MyChart) OR . A paper copy in the mail If you have any lab test that is abnormal or we need to change your treatment, we will call you to review the results.  Testing/Procedures: Your physician has requested that you have an echocardiogram. Echocardiography is a painless test that uses sound waves to create images of your heart. It provides your doctor with information about the size and shape of your heart and how well your heart's chambers and valves are working. This procedure takes approximately one hour. There are no restrictions for this procedure.  Follow-Up: At [facility], you and your health needs are our priority.  As part of our continuing mission to provide you with exceptional heart care, we have created designated Provider Care Teams.  These Care Teams include your primary Cardiologist (physician) and Advanced Practice Providers (APPs -  Physician Assistants and Nurse Practitioners) who all work together to provide you with the care you need, when you need it. You will need a follow up appointment in 1 years.  Please call our office 2 months in advance to schedule this appointment.  You may see Dr. Radford Pax or one of the following Advanced Practice Providers on your designated Care Team:   [place], PA-C Melina Copa, PA-C . Ermalinda Barrios, PA-C

## 2019-02-13 ENCOUNTER — Other Ambulatory Visit: Payer: Self-pay

## 2019-02-13 ENCOUNTER — Ambulatory Visit (INDEPENDENT_AMBULATORY_CARE_PROVIDER_SITE_OTHER): Payer: Medicare Other | Admitting: Family Medicine

## 2019-02-13 ENCOUNTER — Encounter: Payer: Self-pay | Admitting: Family Medicine

## 2019-02-13 VITALS — BP 135/85 | HR 63 | Temp 98.1°F | Ht 61.0 in | Wt 159.0 lb

## 2019-02-13 DIAGNOSIS — I270 Primary pulmonary hypertension: Secondary | ICD-10-CM | POA: Diagnosis not present

## 2019-02-13 DIAGNOSIS — I10 Essential (primary) hypertension: Secondary | ICD-10-CM | POA: Diagnosis not present

## 2019-02-13 DIAGNOSIS — M8580 Other specified disorders of bone density and structure, unspecified site: Secondary | ICD-10-CM | POA: Diagnosis not present

## 2019-02-13 DIAGNOSIS — E782 Mixed hyperlipidemia: Secondary | ICD-10-CM | POA: Diagnosis not present

## 2019-02-13 LAB — COMPREHENSIVE METABOLIC PANEL
ALT: 40 U/L — ABNORMAL HIGH (ref 0–35)
AST: 30 U/L (ref 0–37)
Albumin: 4.3 g/dL (ref 3.5–5.2)
Alkaline Phosphatase: 108 U/L (ref 39–117)
BUN: 17 mg/dL (ref 6–23)
CO2: 26 mEq/L (ref 19–32)
Calcium: 10 mg/dL (ref 8.4–10.5)
Chloride: 100 mEq/L (ref 96–112)
Creatinine, Ser: 0.95 mg/dL (ref 0.40–1.20)
GFR: 57.3 mL/min — ABNORMAL LOW (ref >60.00)
Glucose, Bld: 111 mg/dL — ABNORMAL HIGH (ref 70–99)
Potassium: 3.9 mEq/L (ref 3.5–5.1)
Sodium: 137 mEq/L (ref 135–145)
Total Bilirubin: 1.5 mg/dL — ABNORMAL HIGH (ref 0.2–1.2)
Total Protein: 6.6 g/dL (ref 6.0–8.3)

## 2019-02-13 LAB — LIPID PANEL
Cholesterol: 181 mg/dL (ref 0–200)
HDL: 45.2 mg/dL (ref >39.00)
NonHDL: 136.22
Total CHOL/HDL Ratio: 4
Triglycerides: 234 mg/dL — ABNORMAL HIGH (ref 0.0–149.0)
VLDL: 46.8 mg/dL — ABNORMAL HIGH (ref 0.0–40.0)

## 2019-02-13 LAB — LDL CHOLESTEROL, DIRECT: Direct LDL: 101 mg/dL

## 2019-02-13 NOTE — Progress Notes (Signed)
Subjective  CC:  Chief Complaint  Patient presents with  . Follow-up  . Hypertension    HPI: Madison Clements is a 73 y.o. female who presents to the office today to address the problems listed above in the chief complaint.  Hypertension f/u: Control is good . Pt reports she is doing well. taking medications as instructed, no medication side effects noted, no TIAs, no chest pain on exertion, no dyspnea on exertion, no swelling of ankles. She saw cardiology and bp was normal there.  She denies adverse effects from his BP medications. Compliance with medication is good.   HLD with goal ldl < 70 on statin. Will repeat today and adjust statin if needed.   Pulm HTN mild, per cards, stable. Has repeat echo scheduled.   Discussed mild osteopenia by recent bone density. .    Assessment  1. Essential hypertension   2. Mixed hyperlipidemia   3. Pulmonary hypertension, primary ([place])   4. Osteopenia, unspecified location      Plan    Hypertension f/u: BP control is well controlled. No changes at this time. Monitoring renal fxn and lytes  Hyperlipidemia f/u: recheck fasting lipids today to get ldl < 70  pulm HTN: on lasix. Monitor renal function.   Osteopenia: reassured. Repeat bone density it 2021. Continue ca and vit D and weight bearing exercises.   Education regarding management of these chronic disease states was given. Management strategies discussed on successive visits include dietary and exercise recommendations, goals of achieving and maintaining IBW, and lifestyle modifications aiming for adequate sleep and minimizing stressors.   Follow up: Return in about 5 months (around 07/16/2019) for complete physical, follow up Hypertension.  Orders Placed This Encounter  Procedures  . Lipid panel  . Comprehensive metabolic panel   No orders of the defined types were placed in this encounter.     BP Readings from Last 3 Encounters:  02/13/19 135/85  02/07/19 119/80   10/02/18 128/68   Wt Readings from Last 3 Encounters:  02/13/19 159 lb (72.1 kg)  02/07/19 157 lb (71.2 kg)  10/02/18 158 lb 9.6 oz (71.9 kg)    Lab Results  Component Value Date   CHOL 196 07/27/2018   Lab Results  Component Value Date   HDL 65 07/27/2018   Lab Results  Component Value Date   LDLCALC 101 07/27/2018   Lab Results  Component Value Date   TRIG 152 07/27/2018   No results found for: CHOLHDL No results found for: LDLDIRECT Lab Results  Component Value Date   CREATININE 0.7 07/27/2018   BUN 15 07/27/2018   NA 142 07/27/2018   K 4.5 07/27/2018    The 10-year ASCVD risk score Mikey Bussing DC Jr., et al., 2013) is: 23.2%*   Values used to calculate the score:     Age: 24 years     Sex: Female     Is Non-Hispanic African American: No     Diabetic: No     Tobacco smoker: No     Systolic Blood Pressure: 381 mmHg     Is BP treated: Yes     HDL Cholesterol: 65 mg/dL*     Total Cholesterol: 196 mg/dL*     * - Cholesterol units were assumed for this score calculation  I reviewed the patients updated PMH, FH, and SocHx.    Patient Active Problem List   Diagnosis Date Noted  . Essential hypertension 10/02/2018    Priority: High  . History of  melanoma 10/02/2018    Priority: High  . GERD (gastroesophageal reflux disease) 10/02/2018    Priority: Medium  . CAD (coronary artery disease), native coronary artery 02/07/2019  . Mixed hyperlipidemia 10/02/2018  . Osteopenia 10/02/2018  . Primary osteoarthritis involving multiple joints 10/02/2018  . Pulmonary hypertension, primary ([place]) 10/02/2018  . Polyp of colon, adenomatous 10/02/2018  . Gastric polyps 10/02/2018    Allergies: Morphine and Morphine and related  Social History: Patient  reports that she quit smoking about 22 years ago. Her smoking use included cigarettes. She has never used smokeless tobacco.  Current Meds  Medication Sig  . aspirin EC 81 MG tablet Take 81 mg by mouth daily.  Marland Kitchen  atorvastatin (LIPITOR) 40 MG tablet Take 1 tablet by mouth daily.  . celecoxib (CELEBREX) 100 MG capsule Take 1 capsule (100 mg total) by mouth daily.  Marland Kitchen diltiazem (TIAZAC) 180 MG 24 hr capsule Take 1 capsule by mouth daily.  . folic acid (FOLVITE) 202 MCG tablet Take 400 mcg by mouth daily.  . furosemide (LASIX) 40 MG tablet Take 1 tablet (40 mg total) by mouth daily.  Marland Kitchen lisinopril (PRINIVIL,ZESTRIL) 10 MG tablet Take 2 tablets by mouth daily.  Marland Kitchen omeprazole (PRILOSEC) 20 MG capsule Take 1 capsule by mouth daily.  . Probiotic Product (ADVANCED PROBIOTIC PO) Take 1 capsule by mouth daily.  Marland Kitchen Specialty Vitamins Products (ONE-A-DAY BONE STRENGTH PO) Take 1 tablet by mouth every other day.    Review of Systems: Cardiovascular: negative for chest pain, palpitations, leg swelling, orthopnea Respiratory: negative for SOB, wheezing or persistent cough Gastrointestinal: negative for abdominal pain Genitourinary: negative for dysuria or gross hematuria  Objective  Vitals: BP 135/85 (BP Location: Right Arm, Patient Position: Sitting, Cuff Size: Normal)   Pulse 63   Temp 98.1 F (36.7 C) (Oral)   Ht 5\' 1"  (1.549 m)   Wt 159 lb (72.1 kg)   SpO2 96%   BMI 30.04 kg/m  General: no acute distress  Psych:  Alert and oriented, normal mood and affect HEENT:  Normocephalic, atraumatic, supple neck  Cardiovascular:  RRR without murmur. no edema Respiratory:  Good breath sounds bilaterally, CTAB with normal respiratory effort Skin:  Warm, no rashes Neurologic:   Mental status is normal, no tremor  Commons side effects, risks, benefits, and alternatives for medications and treatment plan prescribed today were discussed, and the patient expressed understanding of the given instructions. Patient is instructed to call or message via MyChart if he/she has any questions or concerns regarding our treatment plan. No barriers to understanding were identified. We discussed Red Flag symptoms and signs in detail.  Patient expressed understanding regarding what to do in case of urgent or emergency type symptoms.   Medication list was reconciled, printed and provided to the patient in AVS. Patient instructions and summary information was reviewed with the patient as documented in the AVS. This note was prepared with assistance of Dragon voice recognition software. Occasional wrong-word or sound-a-like substitutions may have occurred due to the inherent limitations of voice recognition software

## 2019-02-13 NOTE — Patient Instructions (Signed)
Please return in November for your annual complete physical; please come fasting.   If you have any questions or concerns, please don't hesitate to send me a message via MyChart or call the office at [phone]. Thank you for visiting with [place] today! It's our pleasure caring for you.  I will release your lab results to you on your MyChart account with further instructions. Please reply with any questions.  We may need to adjust your cholesterol lowering medication if your cholesterol is not yet at goal.

## 2019-03-08 ENCOUNTER — Telehealth (HOSPITAL_COMMUNITY): Payer: Self-pay | Admitting: *Deleted

## 2019-03-08 NOTE — Telephone Encounter (Signed)
COVID-19 Pre-Screening Questions:  . Do you currently have a fever? NO (yes = cancel and refer to pcp for e-visit) . Have you recently travelled on a cruise, internationally, or to [place], [place], [place], [place], [place], or [place], [place] ([place]) ? NO (yes = cancel, stay home, monitor symptoms, and contact pcp or initiate e-visit if symptoms develop) . Have you been in contact with someone that is currently pending confirmation of Covid19 testing or has been confirmed to have the Covid19 virus?  NO (yes = cancel, stay home, away from tested individual, monitor symptoms, and contact pcp or initiate e-visit if symptoms develop) . Are you currently experiencing fatigue or cough? NO (yes = pt should be prepared to have a mask placed at the time of their visit).   . Reiterated no additional visitors. . Arrive no earlier than 15 minutes before appointment time. . Please bring own mask.  Madison Clements 

## 2019-03-11 ENCOUNTER — Ambulatory Visit (HOSPITAL_COMMUNITY): Payer: Medicare Other | Attending: Cardiology

## 2019-03-11 ENCOUNTER — Other Ambulatory Visit: Payer: Self-pay

## 2019-03-11 DIAGNOSIS — I270 Primary pulmonary hypertension: Secondary | ICD-10-CM | POA: Diagnosis not present

## 2019-03-14 ENCOUNTER — Telehealth: Payer: Self-pay

## 2019-03-14 NOTE — Telephone Encounter (Signed)
Spoke with the patient about her echo results. She also reported that Dr. Jonni Sanger increase Lipitor to 80 mg, daily and 3 weeks ago increase her lisinopril to 20 mg daily. Updated her chart.

## 2019-03-20 ENCOUNTER — Other Ambulatory Visit: Payer: Self-pay | Admitting: Family Medicine

## 2019-03-21 MED ORDER — CELECOXIB 100 MG PO CAPS: 100.0000 mg | ORAL_CAPSULE | Freq: Every day | ORAL | 0 refills | Status: DC

## 2019-04-29 ENCOUNTER — Other Ambulatory Visit: Payer: Self-pay

## 2019-04-29 ENCOUNTER — Ambulatory Visit (INDEPENDENT_AMBULATORY_CARE_PROVIDER_SITE_OTHER): Payer: Medicare Other | Admitting: Family Medicine

## 2019-04-29 ENCOUNTER — Encounter: Payer: Self-pay | Admitting: Family Medicine

## 2019-04-29 VITALS — BP 126/74 | HR 67 | Temp 97.5°F | Resp 16 | Ht 61.0 in | Wt 159.4 lb

## 2019-04-29 DIAGNOSIS — E782 Mixed hyperlipidemia: Secondary | ICD-10-CM

## 2019-04-29 DIAGNOSIS — I10 Essential (primary) hypertension: Secondary | ICD-10-CM | POA: Diagnosis not present

## 2019-04-29 DIAGNOSIS — K640 First degree hemorrhoids: Secondary | ICD-10-CM

## 2019-04-29 DIAGNOSIS — I2510 Atherosclerotic heart disease of native coronary artery without angina pectoris: Secondary | ICD-10-CM

## 2019-04-29 LAB — LIPID PANEL
Cholesterol: 172 mg/dL (ref 0–200)
HDL: 43.5 mg/dL (ref >39.00)
NonHDL: 128.58
Total CHOL/HDL Ratio: 4
Triglycerides: 248 mg/dL — ABNORMAL HIGH (ref 0.0–149.0)
VLDL: 49.6 mg/dL — ABNORMAL HIGH (ref 0.0–40.0)

## 2019-04-29 LAB — COMPREHENSIVE METABOLIC PANEL
ALT: 48 U/L — ABNORMAL HIGH (ref 0–35)
AST: 34 U/L (ref 0–37)
Albumin: 4.5 g/dL (ref 3.5–5.2)
Alkaline Phosphatase: 102 U/L (ref 39–117)
BUN: 14 mg/dL (ref 6–23)
CO2: 25 mEq/L (ref 19–32)
Calcium: 10 mg/dL (ref 8.4–10.5)
Chloride: 102 mEq/L (ref 96–112)
Creatinine, Ser: 0.86 mg/dL (ref 0.40–1.20)
GFR: 64.24 mL/min (ref >60.00)
Glucose, Bld: 108 mg/dL — ABNORMAL HIGH (ref 70–99)
Potassium: 3.7 mEq/L (ref 3.5–5.1)
Sodium: 139 mEq/L (ref 135–145)
Total Bilirubin: 1.8 mg/dL — ABNORMAL HIGH (ref 0.2–1.2)
Total Protein: 6.5 g/dL (ref 6.0–8.3)

## 2019-04-29 LAB — LDL CHOLESTEROL, DIRECT: Direct LDL: 93 mg/dL

## 2019-04-29 MED ORDER — CELECOXIB 100 MG PO CAPS: 100.0000 mg | ORAL_CAPSULE | Freq: Every day | ORAL | 3 refills | Status: DC

## 2019-04-29 MED ORDER — LISINOPRIL 20 MG PO TABS: 20.0000 mg | ORAL_TABLET | Freq: Every day | ORAL | 3 refills | Status: DC

## 2019-04-29 MED ORDER — DILTIAZEM HCL ER BEADS 180 MG PO CP24: 180.0000 mg | ORAL_CAPSULE | Freq: Every day | ORAL | 3 refills | Status: DC

## 2019-04-29 MED ORDER — OMEPRAZOLE 20 MG PO CPDR: 20.0000 mg | DELAYED_RELEASE_CAPSULE | Freq: Every day | ORAL | 3 refills | Status: DC

## 2019-04-29 MED ORDER — FUROSEMIDE 40 MG PO TABS: 40.0000 mg | ORAL_TABLET | Freq: Every day | ORAL | 3 refills | Status: DC

## 2019-04-29 NOTE — Patient Instructions (Signed)
Please return in 6 months for follow up of your hypertension.   I will release your lab results to you on your MyChart account with further instructions. Please reply with any questions.   If you have any questions or concerns, please don't hesitate to send me a message via MyChart or call the office at [phone]. Thank you for visiting with [place] today! It's our pleasure caring for you.

## 2019-04-29 NOTE — Progress Notes (Signed)
Subjective  CC:  Chief Complaint  Patient presents with  . Hyperlipidemia    Fasting  . Hypertension    Has been checking daily  . Vaginitis    Started about 1 week ago.. Discomfort near the vaginal area and redness.Marland Kitchen Has not tried using anything    HPI: Madison Clements is a 73 y.o. female who presents to the office today to address the problems listed above in the chief complaint.  Hypertension f/u: Control is good . Pt reports she is doing well. taking medications as instructed, no medication side effects noted, no TIAs, no chest pain on exertion, no dyspnea on exertion, no swelling of ankles. Home readings are now consistently normal after increasing lisinopril dose. She has been checking regularly at home. She denies adverse effects from his BP medications. Compliance with medication is good.   HLD: increased lipitor to 80; tolerating well. Goal LDL < 70 due to mild cad.   C/o soreness posterior to rectum. Notices only when wipes. No melena or constipation or straining. No vaginal sxs or rash.   Assessment  1. Mixed hyperlipidemia   2. Essential hypertension   3. Grade I hemorrhoids   4. Coronary artery disease involving native coronary artery of native heart without angina pectoris      Plan    Hypertension f/u: BP control is well controlled. Continue current meds. Recheck potassium/renal due to ace dose adjustment.  Hyperlipidemia f/u: recheck lipids on high dose lipitor.  Hemorrhoid: reassured. Prep H or anusol HC prn.  Education regarding management of these chronic disease states was given. Management strategies discussed on successive visits include dietary and exercise recommendations, goals of achieving and maintaining IBW, and lifestyle modifications aiming for adequate sleep and minimizing stressors.   Follow up: 6 months HTN f/u  Orders Placed This Encounter  Procedures  . Comprehensive metabolic panel  . Lipid panel   Meds ordered this encounter   Medications  . lisinopril (ZESTRIL) 20 MG tablet    Sig: Take 1 tablet (20 mg total) by mouth daily.    Dispense:  90 tablet    Refill:  3  . furosemide (LASIX) 40 MG tablet    Sig: Take 1 tablet (40 mg total) by mouth daily.    Dispense:  90 tablet    Refill:  3  . celecoxib (CELEBREX) 100 MG capsule    Sig: Take 1 capsule (100 mg total) by mouth daily.    Dispense:  90 capsule    Refill:  3  . diltiazem (TIAZAC) 180 MG 24 hr capsule    Sig: Take 1 capsule (180 mg total) by mouth daily.    Dispense:  90 capsule    Refill:  3  . omeprazole (PRILOSEC) 20 MG capsule    Sig: Take 1 capsule (20 mg total) by mouth daily.    Dispense:  90 capsule    Refill:  3      BP Readings from Last 3 Encounters:  04/29/19 126/74  02/13/19 135/85  02/07/19 119/80   Wt Readings from Last 3 Encounters:  04/29/19 159 lb 6.4 oz (72.3 kg)  02/13/19 159 lb (72.1 kg)  02/07/19 157 lb (71.2 kg)    Lab Results  Component Value Date   CHOL 181 02/13/2019   CHOL 196 07/27/2018   Lab Results  Component Value Date   HDL 45.20 02/13/2019   HDL 65 07/27/2018   Lab Results  Component Value Date   LDLCALC 101 07/27/2018  Lab Results  Component Value Date   TRIG 234.0 (H) 02/13/2019   TRIG 152 07/27/2018   Lab Results  Component Value Date   CHOLHDL 4 02/13/2019   Lab Results  Component Value Date   LDLDIRECT 101.0 02/13/2019   Lab Results  Component Value Date   CREATININE 0.95 02/13/2019   BUN 17 02/13/2019   NA 137 02/13/2019   K 3.9 02/13/2019   CL 100 02/13/2019   CO2 26 02/13/2019    The 10-year ASCVD risk score Mikey Bussing DC Jr., et al., 2013) is: 20.2%   Values used to calculate the score:     Age: 51 years     Sex: Female     Is Non-Hispanic African American: No     Diabetic: No     Tobacco smoker: No     Systolic Blood Pressure: 426 mmHg     Is BP treated: Yes     HDL Cholesterol: 45.2 mg/dL     Total Cholesterol: 181 mg/dL  I reviewed the patients updated PMH,  FH, and SocHx.    Patient Active Problem List   Diagnosis Date Noted  . Essential hypertension 10/02/2018    Priority: High  . History of melanoma 10/02/2018    Priority: High  . GERD (gastroesophageal reflux disease) 10/02/2018    Priority: Medium  . CAD (coronary artery disease), native coronary artery 02/07/2019  . Mixed hyperlipidemia 10/02/2018  . Osteopenia 10/02/2018  . Primary osteoarthritis involving multiple joints 10/02/2018  . Pulmonary hypertension, primary ([place]) 10/02/2018  . Polyp of colon, adenomatous 10/02/2018  . Gastric polyps 10/02/2018    Allergies: Morphine and Morphine and related  Social History: Patient  reports that she quit smoking about 22 years ago. Her smoking use included cigarettes. She has never used smokeless tobacco.  Current Meds  Medication Sig  . aspirin EC 81 MG tablet Take 81 mg by mouth daily.  Marland Kitchen atorvastatin (LIPITOR) 80 MG tablet Take 80 mg by mouth daily.  . celecoxib (CELEBREX) 100 MG capsule Take 1 capsule (100 mg total) by mouth daily.  Marland Kitchen diltiazem (TIAZAC) 180 MG 24 hr capsule Take 1 capsule (180 mg total) by mouth daily.  . folic acid (FOLVITE) 834 MCG tablet Take 400 mcg by mouth daily.  . furosemide (LASIX) 40 MG tablet Take 1 tablet (40 mg total) by mouth daily.  Marland Kitchen lisinopril (ZESTRIL) 20 MG tablet Take 1 tablet (20 mg total) by mouth daily.  Marland Kitchen omeprazole (PRILOSEC) 20 MG capsule Take 1 capsule (20 mg total) by mouth daily.  . Probiotic Product (ADVANCED PROBIOTIC PO) Take 1 capsule by mouth daily.  Marland Kitchen Specialty Vitamins Products (ONE-A-DAY BONE STRENGTH PO) Take 1 tablet by mouth every other day.  . [DISCONTINUED] celecoxib (CELEBREX) 100 MG capsule Take 1 capsule (100 mg total) by mouth daily.  . [DISCONTINUED] diltiazem (TIAZAC) 180 MG 24 hr capsule Take 1 capsule by mouth daily.  . [DISCONTINUED] furosemide (LASIX) 40 MG tablet Take 1 tablet (40 mg total) by mouth daily.  . [DISCONTINUED] lisinopril (PRINIVIL,ZESTRIL) 10 MG  tablet Take 2 tablets by mouth daily.  . [DISCONTINUED] omeprazole (PRILOSEC) 20 MG capsule Take 1 capsule by mouth daily.    Review of Systems: Cardiovascular: negative for chest pain, palpitations, leg swelling, orthopnea Respiratory: negative for SOB, wheezing or persistent cough Gastrointestinal: negative for abdominal pain Genitourinary: negative for dysuria or gross hematuria  Objective  Vitals: BP 126/74   Pulse 67   Temp (!) 97.5 F (36.4 C) (Tympanic)  Resp 16   Ht 5\' 1"  (1.549 m)   Wt 159 lb 6.4 oz (72.3 kg)   SpO2 98%   BMI 30.12 kg/m  General: no acute distress  Psych:  Alert and oriented, normal mood and affect HEENT:  Normocephalic, atraumatic, supple neck  Cardiovascular:  RRR without murmur. no edema Respiratory:  Good breath sounds bilaterally, CTAB with normal respiratory effort Skin:  Warm, no rashes, posterior rectal hemorroid is present. No perirectal rash. Normal introitus  Neurologic:   Mental status is normal  Commons side effects, risks, benefits, and alternatives for medications and treatment plan prescribed today were discussed, and the patient expressed understanding of the given instructions. Patient is instructed to call or message via MyChart if he/she has any questions or concerns regarding our treatment plan. No barriers to understanding were identified. We discussed Red Flag symptoms and signs in detail. Patient expressed understanding regarding what to do in case of urgent or emergency type symptoms.   Medication list was reconciled, printed and provided to the patient in AVS. Patient instructions and summary information was reviewed with the patient as documented in the AVS. This note was prepared with assistance of Dragon voice recognition software. Occasional wrong-word or sound-a-like substitutions may have occurred due to the inherent limitations of voice recognition software

## 2019-05-01 MED ORDER — ROSUVASTATIN CALCIUM 40 MG PO TABS: 40.0000 mg | ORAL_TABLET | Freq: Every day | ORAL | 3 refills | Status: DC

## 2019-05-01 NOTE — Addendum Note (Signed)
Addended by: Billey Chang on: 05/01/2019 08:23 AM   Modules accepted: Orders

## 2019-07-16 ENCOUNTER — Ambulatory Visit (INDEPENDENT_AMBULATORY_CARE_PROVIDER_SITE_OTHER): Payer: Medicare Other | Admitting: Family Medicine

## 2019-07-16 ENCOUNTER — Encounter: Payer: Self-pay | Admitting: Family Medicine

## 2019-07-16 ENCOUNTER — Other Ambulatory Visit: Payer: Self-pay

## 2019-07-16 VITALS — BP 126/78 | HR 66 | Temp 97.4°F | Resp 16 | Ht 61.0 in | Wt 159.4 lb

## 2019-07-16 DIAGNOSIS — I270 Primary pulmonary hypertension: Secondary | ICD-10-CM | POA: Diagnosis not present

## 2019-07-16 DIAGNOSIS — Z23 Encounter for immunization: Secondary | ICD-10-CM | POA: Diagnosis not present

## 2019-07-16 DIAGNOSIS — M8580 Other specified disorders of bone density and structure, unspecified site: Secondary | ICD-10-CM | POA: Diagnosis not present

## 2019-07-16 DIAGNOSIS — E782 Mixed hyperlipidemia: Secondary | ICD-10-CM

## 2019-07-16 DIAGNOSIS — I10 Essential (primary) hypertension: Secondary | ICD-10-CM | POA: Diagnosis not present

## 2019-07-16 DIAGNOSIS — I2510 Atherosclerotic heart disease of native coronary artery without angina pectoris: Secondary | ICD-10-CM | POA: Diagnosis not present

## 2019-07-16 LAB — CBC WITH DIFFERENTIAL/PLATELET
Basophils Absolute: 0 10*3/uL (ref 0.0–0.1)
Basophils Relative: 0.8 % (ref 0.0–3.0)
Eosinophils Absolute: 0.1 10*3/uL (ref 0.0–0.7)
Eosinophils Relative: 2.2 % (ref 0.0–5.0)
HCT: 42.9 % (ref 36.0–46.0)
Hemoglobin: 14.8 g/dL (ref 12.0–15.0)
Lymphocytes Relative: 34.2 % (ref 12.0–46.0)
Lymphs Abs: 1.9 10*3/uL (ref 0.7–4.0)
MCHC: 34.4 g/dL (ref 30.0–36.0)
MCV: 90.4 fl (ref 78.0–100.0)
Monocytes Absolute: 0.6 10*3/uL (ref 0.1–1.0)
Monocytes Relative: 10.6 % (ref 3.0–12.0)
Neutro Abs: 2.9 10*3/uL (ref 1.4–7.7)
Neutrophils Relative %: 52.2 % (ref 43.0–77.0)
Platelets: 256 10*3/uL (ref 150.0–400.0)
RBC: 4.75 Mil/uL (ref 3.87–5.11)
RDW: 12.8 % (ref 11.5–15.5)
WBC: 5.6 10*3/uL (ref 4.0–10.5)

## 2019-07-16 LAB — COMPREHENSIVE METABOLIC PANEL
ALT: 44 U/L — ABNORMAL HIGH (ref 0–35)
AST: 38 U/L — ABNORMAL HIGH (ref 0–37)
Albumin: 4.6 g/dL (ref 3.5–5.2)
Alkaline Phosphatase: 99 U/L (ref 39–117)
BUN: 18 mg/dL (ref 6–23)
CO2: 25 mEq/L (ref 19–32)
Calcium: 10.1 mg/dL (ref 8.4–10.5)
Chloride: 101 mEq/L (ref 96–112)
Creatinine, Ser: 0.94 mg/dL (ref 0.40–1.20)
GFR: 57.94 mL/min — ABNORMAL LOW (ref >60.00)
Glucose, Bld: 109 mg/dL — ABNORMAL HIGH (ref 70–99)
Potassium: 4 mEq/L (ref 3.5–5.1)
Sodium: 136 mEq/L (ref 135–145)
Total Bilirubin: 1.4 mg/dL — ABNORMAL HIGH (ref 0.2–1.2)
Total Protein: 6.6 g/dL (ref 6.0–8.3)

## 2019-07-16 LAB — LIPID PANEL
Cholesterol: 160 mg/dL (ref 0–200)
HDL: 40.8 mg/dL (ref >39.00)
NonHDL: 119.12
Total CHOL/HDL Ratio: 4
Triglycerides: 234 mg/dL — ABNORMAL HIGH (ref 0.0–149.0)
VLDL: 46.8 mg/dL — ABNORMAL HIGH (ref 0.0–40.0)

## 2019-07-16 LAB — TSH: TSH: 1.92 u[IU]/mL (ref 0.35–4.50)

## 2019-07-16 LAB — LDL CHOLESTEROL, DIRECT: Direct LDL: 78 mg/dL

## 2019-07-16 NOTE — Patient Instructions (Signed)
Please return in 6 months for follow up of your hypertension. Medicare recommends an Annual Wellness Visit for all patients. Please schedule this to be done with our Nurse Educator, Loma Sousa. This is an informative "talk" visit; it's goals are to ensure that your health care needs are being met and to give you education regarding avoiding falls, ensuring you are not suffering from depression or problems with memory or thinking, and to educate you on Advance Care Planning. It helps me take good care of you!  I will release your lab results to you on your MyChart account with further instructions. Please reply with any questions.    If you have any questions or concerns, please don't hesitate to send me a message via MyChart or call the office at [phone]. Thank you for visiting with [place] today! It's our pleasure caring for you.   Preventive Care 31 Years and Older, Female Preventive care refers to lifestyle choices and visits with your health care provider that can promote health and wellness. This includes:  A yearly physical exam. This is also called an annual well check.  Regular dental and eye exams.  Immunizations.  Screening for certain conditions.  Healthy lifestyle choices, such as diet and exercise. What can I expect for my preventive care visit? Physical exam Your health care provider will check:  Height and weight. These may be used to calculate body mass index (BMI), which is a measurement that tells if you are at a healthy weight.  Heart rate and blood pressure.  Your skin for abnormal spots. Counseling Your health care provider may ask you questions about:  Alcohol, tobacco, and drug use.  Emotional well-being.  Home and relationship well-being.  Sexual activity.  Eating habits.  History of falls.  Memory and ability to understand (cognition).  Work and work Statistician.  Pregnancy and menstrual history. What immunizations do I need?  Influenza (flu)  vaccine  This is recommended every year. Tetanus, diphtheria, and pertussis (Tdap) vaccine  You may need a Td booster every 10 years. Varicella (chickenpox) vaccine  You may need this vaccine if you have not already been vaccinated. Zoster (shingles) vaccine  You may need this after age 5. Pneumococcal conjugate (PCV13) vaccine  One dose is recommended after age 71. Pneumococcal polysaccharide (PPSV23) vaccine  One dose is recommended after age 67. Measles, mumps, and rubella (MMR) vaccine  You may need at least one dose of MMR if you were born in 1957 or later. You may also need a second dose. Meningococcal conjugate (MenACWY) vaccine  You may need this if you have certain conditions. Hepatitis A vaccine  You may need this if you have certain conditions or if you travel or work in places where you may be exposed to hepatitis A. Hepatitis B vaccine  You may need this if you have certain conditions or if you travel or work in places where you may be exposed to hepatitis B. Haemophilus influenzae type b (Hib) vaccine  You may need this if you have certain conditions. You may receive vaccines as individual doses or as more than one vaccine together in one shot (combination vaccines). Talk with your health care provider about the risks and benefits of combination vaccines. What tests do I need? Blood tests  Lipid and cholesterol levels. These may be checked every 5 years, or more frequently depending on your overall health.  Hepatitis C test.  Hepatitis B test. Screening  Lung cancer screening. You may have this screening every  year starting at age 55 if you have a 30-pack-year history of smoking and currently smoke or have quit within the past 15 years.  Colorectal cancer screening. All adults should have this screening starting at age 50 and continuing until age 75. Your health care provider may recommend screening at age 45 if you are at increased risk. You will have  tests every 1-10 years, depending on your results and the type of screening test.  Diabetes screening. This is done by checking your blood sugar (glucose) after you have not eaten for a while (fasting). You may have this done every 1-3 years.  Mammogram. This may be done every 1-2 years. Talk with your health care provider about how often you should have regular mammograms.  BRCA-related cancer screening. This may be done if you have a family history of breast, ovarian, tubal, or peritoneal cancers. Other tests  Sexually transmitted disease (STD) testing.  Bone density scan. This is done to screen for osteoporosis. You may have this done starting at age 65. Follow these instructions at home: Eating and drinking  Eat a diet that includes fresh fruits and vegetables, whole grains, lean protein, and low-fat dairy products. Limit your intake of foods with high amounts of sugar, saturated fats, and salt.  Take vitamin and mineral supplements as recommended by your health care provider.  Do not drink alcohol if your health care provider tells you not to drink.  If you drink alcohol: ? Limit how much you have to 0-1 drink a day. ? Be aware of how much alcohol is in your drink. In the [place] one drink equals one 12 oz bottle of beer (355 mL), one 5 oz glass of wine (148 mL), or one 1 oz glass of hard liquor (44 mL). Lifestyle  Take daily care of your teeth and gums.  Stay active. Exercise for at least 30 minutes on 5 or more days each week.  Do not use any products that contain nicotine or tobacco, such as cigarettes, e-cigarettes, and chewing tobacco. If you need help quitting, ask your health care provider.  If you are sexually active, practice safe sex. Use a condom or other form of protection in order to prevent STIs (sexually transmitted infections).  Talk with your health care provider about taking a low-dose aspirin or statin. What's next?  Go to your health care provider once a  year for a well check visit.  Ask your health care provider how often you should have your eyes and teeth checked.  Stay up to date on all vaccines. This information is not intended to replace advice given to you by your health care provider. Make sure you discuss any questions you have with your health care provider. Document Released: 09/25/2015 Document Revised: 08/23/2018 Document Reviewed: 08/23/2018 Elsevier Patient Education  2020 [employer]   

## 2019-07-16 NOTE — Progress Notes (Signed)
Subjective  Chief Complaint  Patient presents with  . Annual Exam    Fasting  . Hypertension    Reports her diastolic has been in the 123XX123  . Hyperlipidemia    HPI: Madison Clements is a 73 y.o. female who presents to [facility] [place] today for a Female Wellness Visit. She also has the concerns and/or needs as listed above in the chief complaint. These will be addressed in addition to the Health Maintenance Visit.   Wellness Visit: annual visit with health maintenance review and exam without Pap   HM: doing well.  A little less active and notices some mild increased WOB with walking up inclines etc but no cp. Ros + palpitations, not new. mammo and dexa due in Jan 2021. Colonoscopy due in July 2021. Will need a referral. Feels well. No complaints Chronic disease f/u and/or acute problem visit: (deemed necessary to be done in addition to the wellness visit):  HTN: home daily readings show good control with occ diastolic between 123XX123, typically during exertion. Tolerating meds.   HLD: changed to crestor 6 months ago to push LDL < 70 per recs from cards. Tolerating well. Due for recheck. Monitoring mildly elevated AST. No myalgias  Minimal CAD  Pulmonary HTN: stable per cards.   Assessment  1. Essential hypertension   2. Mixed hyperlipidemia   3. 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 Pulmonary hypertension, primary ([place])   5. Osteopenia, unspecified location      Plan  Female Wellness Visit:  Age appropriate Health Maintenance and Prevention measures were discussed with patient. Included topics are cancer screening recommendations, ways to keep healthy (see AVS) including dietary and exercise recommendations, regular eye and dental care, use of seat belts, and avoidance of moderate alcohol use and tobacco use.   BMI: discussed patient's BMI and encouraged positive lifestyle modifications to  help get to or maintain a target BMI.  HM needs and immunizations were addressed and ordered. See below for orders. See HM and immunization section for updates. Flu shot today. Monitor for reaction (h/o rigors in past)  Routine labs and screening tests ordered including cmp, cbc and lipids where appropriate.  Discussed recommendations regarding Vit D and calcium supplementation (see AVS)  Chronic disease management visit and/or acute problem visit:  HTN: well controlled.   HLD: recheck lipids and lfts today on crestor  Cards and pulm HTN: stable.   Increase activity as tolerated.   Follow up: Return in about 6 months (around 01/13/2020) for follow up Hypertension, AWV at patient's convenience.  Orders Placed This Encounter  Procedures  . CBC with Differential/Platelet  . Comprehensive metabolic panel  . Lipid panel  . TSH   No orders of the defined types were placed in this encounter.     Lifestyle: Body mass index is 30.12 kg/m. Wt Readings from Last 3 Encounters:  07/16/19 159 lb 6.4 oz (72.3 kg)  04/29/19 159 lb 6.4 oz (72.3 kg)  02/13/19 159 lb (72.1 kg)    Patient Active Problem List   Diagnosis Date Noted  . CAD (coronary artery disease), native coronary artery 02/07/2019    Priority: High    Minimal CAD at the time of cath in 2016 in [facility]   . Essential hypertension 10/02/2018    Priority: High  . Mixed hyperlipidemia 10/02/2018    Priority: High  . Pulmonary hypertension, primary ([place]) 10/02/2018  Priority: High    Diagnosed around 2010; has seen pulm and cards in the past.  Left heart catheterization in 2016 - minimal vascular disease   . History of melanoma 10/02/2018    Priority: High    Abdomen 2012; 2013; s/p local excisions.    . Polyp of colon, adenomatous 10/02/2018    Priority: High    Colonoscopy 03/2015; recheck in 2021   . GERD (gastroesophageal reflux disease) 10/02/2018    Priority: Medium  . Osteopenia 10/02/2018     Priority: Medium    Dexa 2019: T = -1.4 lowest; recheck 2 years.    . Primary osteoarthritis involving multiple joints 10/02/2018    Priority: Medium    Daily celebrex; bilateral knees, h/o right meniscal repair; has had viscosupplementation and steroid injections in the past.  Bilateral hands,no back problems   . Gastric polyps 10/02/2018    Priority: Medium    EGD 03/2015; benign; done for dysphagia    Health Maintenance  Topic Date Due  . INFLUENZA VACCINE  04/13/2019  . DEXA SCAN  10/31/2019  . MAMMOGRAM  11/07/2019  . COLONOSCOPY  03/12/2020  . Hepatitis C Screening  Completed  . PNA vac Low Risk Adult  Completed   Immunization History  Administered Date(s) Administered  . Pneumococcal Conjugate-13 07/18/2013  . Pneumococcal Polysaccharide-23 09/11/2009  . Zoster Recombinat (Shingrix) 08/10/2012   We updated and reviewed the patient's past history in detail and it is documented below. Allergies: Patient is allergic to morphine and morphine and related. Past Medical History Patient  has a past medical history of Arthritis, CAD (coronary artery disease), native coronary artery (02/07/2019), Gastric polyps (10/02/2018), GERD (gastroesophageal reflux disease) (10/02/2018), History of melanoma (10/02/2018), Hyperlipidemia, Hypertension, Osteopenia, Polyp of colon, adenomatous (10/02/2018), and Pulmonary hypertension, primary ([place]) (10/02/2018). Past Surgical History Patient  has a past surgical history that includes Meniscus repair (2001); Appendectomy (1962); Abdominal hysterectomy; Bladder suspension; and thumb surgery. Family History: Patient family history includes Arthritis in her mother, paternal grandmother, and sister; Asthma in her maternal grandfather; Cancer in her sister; Diabetes in her sister; Early death in her maternal grandmother; Heart attack in her father; Heart disease in her father and paternal grandmother; High Cholesterol in her sister; High blood pressure in  her father, maternal grandmother, mother, paternal grandmother, and sister; Kidney disease in her maternal grandfather and paternal grandfather. Social History:  Patient  reports that she quit smoking about 22 years ago. Her smoking use included cigarettes. She has never used smokeless tobacco.  Review of Systems: Constitutional: negative for fever or malaise Ophthalmic: negative for photophobia, double vision or loss of vision Cardiovascular: negative for chest pain,  or new LE swelling Respiratory: negative for SOB or persistent cough Gastrointestinal: negative for abdominal pain, change in bowel habits or melena Genitourinary: negative for dysuria or gross hematuria, no abnormal uterine bleeding or disharge Musculoskeletal: negative for new gait disturbance or muscular weakness Integumentary: negative for new or persistent rashes, no breast lumps Neurological: negative for TIA or stroke symptoms Psychiatric: negative for SI or delusions Allergic/Immunologic: negative for hives  Patient Care Team    Relationship Specialty Notifications Start End  Leamon Arnt, MD PCP - General Family Medicine  10/02/18   Sueanne Margarita, MD Consulting Physician Cardiology  02/13/19     Objective  Vitals: BP 126/78   Pulse 66   Temp (!) 97.4 F (36.3 C) (Tympanic)   Resp 16   Ht 5\' 1"  (1.549 m)   Wt 159  lb 6.4 oz (72.3 kg)   SpO2 97%   BMI 30.12 kg/m  General:  Well developed, well nourished, no acute distress  Psych:  Alert and orientedx3,normal mood and affect HEENT:  Normocephalic, atraumatic, non-icteric sclera, PERRL, oropharynx is clear without mass or exudate, supple neck without adenopathy, mass or thyromegaly Cardiovascular:  Normal S1, S2, RRR without gallop, rub or murmur, nondisplaced PMI Respiratory:  Good breath sounds bilaterally, CTAB with normal respiratory effort Gastrointestinal: normal bowel sounds, soft, non-tender, no noted masses. No HSM MSK: no deformities, contusions.  Joints are without erythema or swelling. Spine and CVA region are nontender Skin:  Warm, no rashes or suspicious lesions noted Neurologic:    Mental status is normal. CN 2-11 are normal. Gross motor and sensory exams are normal. Normal gait. No tremor Breast Exam: pt declines today   Commons side effects, risks, benefits, and alternatives for medications and treatment plan prescribed today were discussed, and the patient expressed understanding of the given instructions. Patient is instructed to call or message via MyChart if he/she has any questions or concerns regarding our treatment plan. No barriers to understanding were identified. We discussed Red Flag symptoms and signs in detail. Patient expressed understanding regarding what to do in case of urgent or emergency type symptoms.   Medication list was reconciled, printed and provided to the patient in AVS. Patient instructions and summary information was reviewed with the patient as documented in the AVS. This note was prepared with assistance of Dragon voice recognition software. Occasional wrong-word or sound-a-like substitutions may have occurred due to the inherent limitations of voice recognition software

## 2019-07-26 ENCOUNTER — Other Ambulatory Visit: Payer: Self-pay | Admitting: *Deleted

## 2019-07-26 DIAGNOSIS — Z1231 Encounter for screening mammogram for malignant neoplasm of breast: Secondary | ICD-10-CM

## 2019-07-30 ENCOUNTER — Ambulatory Visit: Payer: Medicare Other

## 2019-08-02 ENCOUNTER — Ambulatory Visit (INDEPENDENT_AMBULATORY_CARE_PROVIDER_SITE_OTHER): Payer: Medicare Other

## 2019-08-02 ENCOUNTER — Other Ambulatory Visit: Payer: Self-pay

## 2019-08-02 DIAGNOSIS — Z0000 Encounter for general adult medical examination without abnormal findings: Secondary | ICD-10-CM | POA: Diagnosis not present

## 2019-08-02 NOTE — Progress Notes (Signed)
This visit is being conducted via phone call due to the COVID-19 pandemic. This patient has given me verbal consent via phone to conduct this visit, patient states they are participating from their home address. Some vital signs may be absent or patient reported.   Patient identification: identified by name, DOB, and current address.  Location provider: [facility], Office Persons participating in the virtual visit: Denman George LPN and Dr. Billey Chang     Subjective:   Madison Clements is a 73 y.o. female who presents for an Initial Medicare Annual Wellness Visit.  Review of Systems     Cardiac Risk Factors include: advanced age (>79men, >57 women);dyslipidemia;hypertension    Objective:    There were no vitals filed for this visit. There is no height or weight on file to calculate BMI.  Advanced Directives 08/02/2019  Does Patient Have a Medical Advance Directive? Yes  Type of Advance Directive Living will;Healthcare Power of Attorney  Does patient want to make changes to medical advance directive? No - Patient declined  Copy of [place] in Chart? Yes - validated most recent copy scanned in chart (See row information)    Current Medications (verified) Outpatient Encounter Medications as of 08/02/2019  Medication Sig  . aspirin EC 81 MG tablet Take 81 mg by mouth daily.  . celecoxib (CELEBREX) 100 MG capsule Take 1 capsule (100 mg total) by mouth daily.  Marland Kitchen diltiazem (TIAZAC) 180 MG 24 hr capsule Take 1 capsule (180 mg total) by mouth daily.  . folic acid (FOLVITE) A999333 MCG tablet Take 400 mcg by mouth daily.  . furosemide (LASIX) 40 MG tablet Take 1 tablet (40 mg total) by mouth daily.  Marland Kitchen lisinopril (ZESTRIL) 20 MG tablet Take 1 tablet (20 mg total) by mouth daily.  Marland Kitchen omeprazole (PRILOSEC) 20 MG capsule Take 1 capsule (20 mg total) by mouth daily.  . Probiotic Product (ADVANCED PROBIOTIC PO) Take 1 capsule by mouth daily.  . rosuvastatin (CRESTOR)  40 MG tablet Take 1 tablet (40 mg total) by mouth at bedtime.  Marland Kitchen Specialty Vitamins Products (ONE-A-DAY BONE STRENGTH PO) Take 1 tablet by mouth every other day.   No facility-administered encounter medications on file as of 08/02/2019.     Allergies (verified) Morphine and Morphine and related   History: Past Medical History:  Diagnosis Date  . Arthritis   . CAD (coronary artery disease), native coronary artery 02/07/2019   Minimal CAD at the time of cath in 2016 in [facility]  . Gastric polyps 10/02/2018   EGD 03/2015; benign  . GERD (gastroesophageal reflux disease) 10/02/2018  . History of melanoma 10/02/2018   Abdomen 2012; 2013; s/p local excisions.   . Hyperlipidemia   . Hypertension   . Osteopenia   . Polyp of colon, adenomatous 10/02/2018   Colonoscopy 03/2015; recheck in 2021  . Pulmonary hypertension, primary ([place]) 10/02/2018   Past Surgical History:  Procedure Laterality Date  . ABDOMINAL HYSTERECTOMY    . APPENDECTOMY  1962  . BLADDER SUSPENSION    . MENISCUS REPAIR  2001  . thumb surgery     Family History  Problem Relation Age of Onset  . Arthritis Mother   . High blood pressure Mother   . Heart attack Father   . Heart disease Father   . High blood pressure Father   . Arthritis Sister   . Diabetes Sister   . High Cholesterol Sister   . High blood pressure Sister   .  Cancer Sister   . Early death Maternal Grandmother        Stroke at 58  . High blood pressure Maternal Grandmother   . Asthma Maternal Grandfather   . Kidney disease Maternal Grandfather   . Arthritis Paternal Grandmother   . Heart disease Paternal Grandmother   . High blood pressure Paternal Grandmother   . Kidney disease Paternal Grandfather    Social History   Socioeconomic History  . Marital status: Widowed    Spouse name: Not on file  . Number of children: Not on file  . Years of education: Not on file  . Highest education level: Not on file  Occupational History   . Occupation: Retired [employer]  Social Needs  . Financial resource strain: Not on file  . Food insecurity    Worry: Not on file    Inability: Not on file  . Transportation needs    Medical: Not on file    Non-medical: Not on file  Tobacco Use  . Smoking status: Former Smoker    Types: Cigarettes    Quit date: 09/12/1996    Years since quitting: 22.9  . Smokeless tobacco: Never Used  Substance and Sexual Activity  . Alcohol use: Not on file  . Drug use: Not on file  . Sexual activity: Not on file  Lifestyle  . Physical activity    Days per week: Not on file    Minutes per session: Not on file  . Stress: Not on file  Relationships  . Social Herbalist on phone: Not on file    Gets together: Not on file    Attends religious service: Not on file    Active member of club or organization: Not on file    Attends meetings of clubs or organizations: Not on file    Relationship status: Not on file  Other Topics Concern  . Not on file  Social History Narrative   Widowed 2018 after 53 years of marriage   One daughter, 2 grandchildren. Lives in [place]   Sister lives in same complex in [place]    Previously lived in [place], [place] given: Not Answered   Clinical Intake:  Pre-visit preparation completed: Yes  Pain : No/denies pain  Diabetes: No  How often do you need to have someone help you when you read instructions, pamphlets, or other written materials from your doctor or pharmacy?: 1 - Never  Interpreter Needed?: No  Information entered by :: Denman George LPN   Activities of Daily Living In your present state of health, do you have any difficulty performing the following activities: 08/02/2019 07/16/2019  Hearing? N N  Vision? N Y  Difficulty concentrating or making decisions? N N  Walking or climbing stairs? N N  Dressing or bathing? N N  Doing errands, shopping? N N  Preparing Food and eating ? N -  Using the  Toilet? N -  In the past six months, have you accidently leaked urine? N -  Do you have problems with loss of bowel control? N -  Managing your Medications? N -  Managing your Finances? N -  Housekeeping or managing your Housekeeping? N -  Some recent data might be hidden     Immunizations and Health Maintenance Immunization History  Administered Date(s) Administered  . Fluad Quad(high Dose 65+) 07/16/2019  . Pneumococcal Conjugate-13 07/18/2013  . Pneumococcal Polysaccharide-23 09/11/2009  . Zoster Recombinat (Shingrix) 08/10/2012  There are no preventive care reminders to display for this patient.  Patient Care Team: Leamon Arnt, MD as PCP - General (Family Medicine) Sueanne Margarita, MD as Consulting Physician (Cardiology) Hayden Pedro, MD as Consulting Physician (Ophthalmology) Warren Danes, PA-C as Physician Assistant (Dermatology)  Indicate any recent Medical Services you may have received from other than [facility] providers in the past year (date may be approximate).     Assessment:   This is a routine wellness examination for Madison Clements.  Hearing/Vision screen No exam data present  Dietary issues and exercise activities discussed: Current Exercise Habits: Home exercise routine, Type of exercise: walking, Time (Minutes): 30, Frequency (Times/Week): 5, Weekly Exercise (Minutes/Week): 150, Intensity: Mild  Goals    . DIET - EAT MORE FRUITS AND VEGETABLES    . Patient Stated     Continue 5000 steps daily and aim to increase       Depression Screen PHQ 2/9 Scores 08/02/2019 07/16/2019 04/29/2019  PHQ - 2 Score 0 0 0    Fall Risk Fall Risk  08/02/2019 07/16/2019 04/29/2019  Falls in the past year? 0 0 0  Number falls in past yr: - 0 0  Injury with Fall? 0 0 0  Follow up Falls evaluation completed;Education provided;Falls prevention discussed Falls evaluation completed Falls evaluation completed    Is the patient's home free of loose throw rugs in walkways,  pet beds, electrical cords, etc?   yes      Grab bars in the bathroom? yes      Handrails on the stairs?   yes      Adequate lighting?   yes   Cognitive Function: no cognitive concerns at this time  Cognitive Testing  Alert? Yes         Normal Appearance? N/a  Oriented to person? Yes           Place? Yes  Time? Yes  Recall of three objects? Yes  Can perform simple calculations? Yes  Displays appropriate judgment? Yes  Can read the correct time from a watch face? Yes     Screening Tests Health Maintenance  Topic Date Due  . DEXA SCAN  10/31/2019  . MAMMOGRAM  11/07/2019  . COLONOSCOPY  03/12/2020  . INFLUENZA VACCINE  Completed  . Hepatitis C Screening  Completed  . PNA vac Low Risk Adult  Completed    Qualifies for Shingles Vaccine? Discussed and patient will check with pharmacy for coverage.  Patient education handout provided   Cancer Screenings: Lung: Low Dose CT Chest recommended if Age 57-80 years, 30 pack-year currently smoking OR have quit w/in 15years. Patient does not qualify. Breast: Up to date on Mammogram? Yes   Up to date of Bone Density/Dexa? Yes Colorectal: colonoscopy 03/13/15     Plan:  I have personally reviewed and addressed the Medicare Annual Wellness questionnaire and have noted the following in the patient's chart:  A. Medical and social history B. Use of alcohol, tobacco or illicit drugs  C. Current medications and supplements D. Functional ability and status E.  Nutritional status F.  Physical activity G. Advance directives H. List of other physicians I.  Hospitalizations, surgeries, and ER visits in previous 12 months J.  [place] such as hearing and vision if needed, cognitive and depression L. Referrals, records requested, and appointments- none   In addition, I have reviewed and discussed with patient certain preventive protocols, quality metrics, and best practice recommendations. A written personalized care plan  for  preventive services as well as general preventive health recommendations were provided to patient.   Signed,  Denman George, LPN  Nurse Health Advisor   Nurse Notes: no additional

## 2019-08-05 ENCOUNTER — Encounter (INDEPENDENT_AMBULATORY_CARE_PROVIDER_SITE_OTHER): Payer: Medicare Other | Admitting: Ophthalmology

## 2019-08-05 ENCOUNTER — Other Ambulatory Visit: Payer: Self-pay

## 2019-08-05 DIAGNOSIS — H33301 Unspecified retinal break, right eye: Secondary | ICD-10-CM

## 2019-08-05 DIAGNOSIS — H43813 Vitreous degeneration, bilateral: Secondary | ICD-10-CM

## 2019-08-05 DIAGNOSIS — H2513 Age-related nuclear cataract, bilateral: Secondary | ICD-10-CM | POA: Diagnosis not present

## 2019-08-05 DIAGNOSIS — H35033 Hypertensive retinopathy, bilateral: Secondary | ICD-10-CM

## 2019-08-05 DIAGNOSIS — I10 Essential (primary) hypertension: Secondary | ICD-10-CM | POA: Diagnosis not present

## 2019-08-06 DIAGNOSIS — D235 Other benign neoplasm of skin of trunk: Secondary | ICD-10-CM | POA: Diagnosis not present

## 2019-08-06 DIAGNOSIS — L570 Actinic keratosis: Secondary | ICD-10-CM | POA: Diagnosis not present

## 2019-08-16 ENCOUNTER — Other Ambulatory Visit: Payer: Self-pay

## 2019-08-16 ENCOUNTER — Encounter: Payer: Self-pay | Admitting: Family Medicine

## 2019-08-16 DIAGNOSIS — E2839 Other primary ovarian failure: Secondary | ICD-10-CM

## 2019-09-25 ENCOUNTER — Other Ambulatory Visit: Payer: Self-pay

## 2019-09-25 ENCOUNTER — Encounter: Payer: Self-pay | Admitting: Family Medicine

## 2019-09-25 MED ORDER — CELECOXIB 100 MG PO CAPS: 100.0000 mg | ORAL_CAPSULE | Freq: Every day | ORAL | 3 refills | Status: DC

## 2019-09-25 MED ORDER — DILTIAZEM HCL ER BEADS 180 MG PO CP24: 180.0000 mg | ORAL_CAPSULE | Freq: Every day | ORAL | 3 refills | Status: DC

## 2019-09-25 MED ORDER — OMEPRAZOLE 20 MG PO CPDR: 20.0000 mg | DELAYED_RELEASE_CAPSULE | Freq: Every day | ORAL | 3 refills | Status: DC

## 2019-09-25 MED ORDER — ROSUVASTATIN CALCIUM 40 MG PO TABS: 40.0000 mg | ORAL_TABLET | Freq: Every day | ORAL | 3 refills | Status: DC

## 2019-09-25 MED ORDER — FUROSEMIDE 40 MG PO TABS: 40.0000 mg | ORAL_TABLET | Freq: Every day | ORAL | 3 refills | Status: DC

## 2019-09-25 MED ORDER — LISINOPRIL 20 MG PO TABS: 20.0000 mg | ORAL_TABLET | Freq: Every day | ORAL | 3 refills | Status: DC

## 2019-09-27 ENCOUNTER — Ambulatory Visit: Payer: Medicare Other | Attending: Internal Medicine

## 2019-09-27 DIAGNOSIS — Z23 Encounter for immunization: Secondary | ICD-10-CM | POA: Insufficient documentation

## 2019-09-27 NOTE — Progress Notes (Signed)
   Covid-19 Vaccination Clinic  Name:  Eudora Lomibao    MRN: [id number] DOB: [birth year]  09/27/2019  Ms. Shafer was observed post Covid-19 immunization for 30 minutes based on pre-vaccination screening without incidence. She was provided with Vaccine Information Sheet and instruction to access the V-Safe system.   Ms. Charity was instructed to call 911 with any severe reactions post vaccine: Marland Kitchen Difficulty breathing  . Swelling of your face and throat  . A fast heartbeat  . A bad rash all over your body  . Dizziness and weakness    Immunizations Administered    Name Date Dose VIS Date Route   Pfizer COVID-19 Vaccine 09/27/2019 12:15 PM 0.3 mL 08/23/2019 Intramuscular   Manufacturer: [place]   Lot: [id number]   [place]: [id number]

## 2019-10-18 ENCOUNTER — Ambulatory Visit: Payer: Medicare Other | Attending: Internal Medicine

## 2019-10-18 DIAGNOSIS — Z23 Encounter for immunization: Secondary | ICD-10-CM

## 2019-10-18 NOTE — Progress Notes (Signed)
   Covid-19 Vaccination Clinic  Name:  Madison Clements    MRN: [id number] DOB: [birth year]  10/18/2019  Madison Clements was observed post Covid-19 immunization for 15 minutes without incidence. She was provided with Vaccine Information Sheet and instruction to access the V-Safe system.   Madison Clements was instructed to call 911 with any severe reactions post vaccine: Marland Kitchen Difficulty breathing  . Swelling of your face and throat  . A fast heartbeat  . A bad rash all over your body  . Dizziness and weakness    Immunizations Administered    Name Date Dose VIS Date Route   Pfizer COVID-19 Vaccine 10/18/2019 12:28 PM 0.3 mL 08/23/2019 Intramuscular   Manufacturer: [place]   Lot: [id number]   [place]: [id number]

## 2019-11-06 ENCOUNTER — Encounter: Payer: Self-pay | Admitting: *Deleted

## 2019-11-15 ENCOUNTER — Other Ambulatory Visit: Payer: Self-pay

## 2019-11-15 ENCOUNTER — Ambulatory Visit
Admission: RE | Admit: 2019-11-15 | Discharge: 2019-11-15 | Disposition: A | Discharge status: Home / Self Care | Payer: Medicare Other | Source: Ambulatory Visit | Attending: Family Medicine | Admitting: Family Medicine

## 2019-11-15 DIAGNOSIS — Z1231 Encounter for screening mammogram for malignant neoplasm of breast: Secondary | ICD-10-CM

## 2019-11-15 DIAGNOSIS — M85851 Other specified disorders of bone density and structure, right thigh: Secondary | ICD-10-CM | POA: Diagnosis not present

## 2019-11-15 DIAGNOSIS — E2839 Other primary ovarian failure: Secondary | ICD-10-CM

## 2019-11-15 DIAGNOSIS — Z780 Asymptomatic menopausal state: Secondary | ICD-10-CM | POA: Diagnosis not present

## 2019-11-25 ENCOUNTER — Other Ambulatory Visit: Payer: Self-pay

## 2019-11-25 ENCOUNTER — Encounter: Payer: Self-pay | Admitting: Physician Assistant

## 2019-11-25 ENCOUNTER — Ambulatory Visit (INDEPENDENT_AMBULATORY_CARE_PROVIDER_SITE_OTHER): Payer: Medicare Other | Admitting: Physician Assistant

## 2019-11-25 DIAGNOSIS — D235 Other benign neoplasm of skin of trunk: Secondary | ICD-10-CM

## 2019-11-25 DIAGNOSIS — D239 Other benign neoplasm of skin, unspecified: Secondary | ICD-10-CM | POA: Diagnosis not present

## 2019-11-25 DIAGNOSIS — L905 Scar conditions and fibrosis of skin: Secondary | ICD-10-CM | POA: Diagnosis not present

## 2019-11-25 NOTE — Patient Instructions (Signed)

## 2019-11-25 NOTE — Addendum Note (Signed)
Addended by: Warren Danes on: 11/25/2019 08:55 AM   Modules accepted: Level of Service

## 2019-11-25 NOTE — Progress Notes (Signed)
   Follow-Up Visit   Subjective  Madison Clements is a 74 y.o. female who presents for the following: Procedure (WIDER SHAVE LEFT INNER BREAST).  The following portions of the chart were reviewed this encounter and updated as appropriate: Tobacco  Meds  Med Hx  Surg Hx      Review of Systems: No other skin or systemic complaints.  Objective  Well appearing patient in no apparent distress; mood and affect are within normal limits.  A focused examination was performed including chest. Relevant physical exam findings are noted in the Assessment and Plan.  Objective  Left Inframammary Fold: Pink macule with good healing after initial biopsy. Patient is here for a wider shave of a moderately DN with positive peripheral margins. Wider shave 1.4cm   Assessment & Plan  Dysplastic nevi Left Inframammary Fold  Skin excision - Left Inframammary Fold  Lesion length (cm):  1.4 Lesion width (cm):  1.4 Margin per side (cm):  0.1 Total excision diameter (cm):  1.6 Timeout: patient name, date of birth, surgical site, and procedure verified   Anesthesia: the lesion was anesthetized in a standard fashion   Hemostasis achieved with: aluminum chloride   Outcome: patient tolerated procedure well with no complications   Post-procedure details: wound care instructions given   Additional details:  Flexible razor blade used.  Specimen 1 - Surgical pathology Differential Diagnosis: Rule out persistent DN Check Margins: Yes  Follow Up in 3 months prn.

## 2019-11-27 ENCOUNTER — Telehealth: Payer: Self-pay

## 2019-11-27 NOTE — Telephone Encounter (Signed)
Left voicemail to call @ 1:40pm.

## 2019-11-27 NOTE — Telephone Encounter (Signed)
-----   Message from Warren Danes, [place] sent at 11/26/2019  4:01 PM EDT ----- Margins clear. Follow up 6 months.

## 2020-01-14 ENCOUNTER — Other Ambulatory Visit: Payer: Self-pay

## 2020-01-14 ENCOUNTER — Encounter: Payer: Self-pay | Admitting: Family Medicine

## 2020-01-14 ENCOUNTER — Ambulatory Visit (INDEPENDENT_AMBULATORY_CARE_PROVIDER_SITE_OTHER): Payer: Medicare Other | Admitting: Family Medicine

## 2020-01-14 VITALS — BP 110/60 | HR 68 | Temp 97.7°F | Resp 15 | Ht 61.0 in | Wt 160.4 lb

## 2020-01-14 DIAGNOSIS — R7989 Other specified abnormal findings of blood chemistry: Secondary | ICD-10-CM | POA: Diagnosis not present

## 2020-01-14 DIAGNOSIS — R0609 Other forms of dyspnea: Secondary | ICD-10-CM

## 2020-01-14 DIAGNOSIS — I10 Essential (primary) hypertension: Secondary | ICD-10-CM

## 2020-01-14 DIAGNOSIS — E782 Mixed hyperlipidemia: Secondary | ICD-10-CM | POA: Diagnosis not present

## 2020-01-14 DIAGNOSIS — D126 Benign neoplasm of colon, unspecified: Secondary | ICD-10-CM | POA: Diagnosis not present

## 2020-01-14 DIAGNOSIS — M159 Polyosteoarthritis, unspecified: Secondary | ICD-10-CM

## 2020-01-14 DIAGNOSIS — M7062 Trochanteric bursitis, left hip: Secondary | ICD-10-CM | POA: Diagnosis not present

## 2020-01-14 DIAGNOSIS — M7061 Trochanteric bursitis, right hip: Secondary | ICD-10-CM

## 2020-01-14 DIAGNOSIS — R0600 Dyspnea, unspecified: Secondary | ICD-10-CM | POA: Diagnosis not present

## 2020-01-14 DIAGNOSIS — I270 Primary pulmonary hypertension: Secondary | ICD-10-CM | POA: Diagnosis not present

## 2020-01-14 DIAGNOSIS — M8949 Other hypertrophic osteoarthropathy, multiple sites: Secondary | ICD-10-CM | POA: Diagnosis not present

## 2020-01-14 LAB — COMPREHENSIVE METABOLIC PANEL
ALT: 41 U/L — ABNORMAL HIGH (ref 0–35)
AST: 39 U/L — ABNORMAL HIGH (ref 0–37)
Albumin: 4.8 g/dL (ref 3.5–5.2)
Alkaline Phosphatase: 113 U/L (ref 39–117)
BUN: 21 mg/dL (ref 6–23)
CO2: 29 mEq/L (ref 19–32)
Calcium: 10.7 mg/dL — ABNORMAL HIGH (ref 8.4–10.5)
Chloride: 100 mEq/L (ref 96–112)
Creatinine, Ser: 1.17 mg/dL (ref 0.40–1.20)
GFR: 44.95 mL/min — ABNORMAL LOW (ref >60.00)
Glucose, Bld: 108 mg/dL — ABNORMAL HIGH (ref 70–99)
Potassium: 4.2 mEq/L (ref 3.5–5.1)
Sodium: 138 mEq/L (ref 135–145)
Total Bilirubin: 1.3 mg/dL — ABNORMAL HIGH (ref 0.2–1.2)
Total Protein: 7.3 g/dL (ref 6.0–8.3)

## 2020-01-14 LAB — LIPID PANEL
Cholesterol: 162 mg/dL (ref 0–200)
HDL: 55.3 mg/dL (ref >39.00)
NonHDL: 106.59
Total CHOL/HDL Ratio: 3
Triglycerides: 248 mg/dL — ABNORMAL HIGH (ref 0.0–149.0)
VLDL: 49.6 mg/dL — ABNORMAL HIGH (ref 0.0–40.0)

## 2020-01-14 LAB — LDL CHOLESTEROL, DIRECT: Direct LDL: 68 mg/dL

## 2020-01-14 MED ORDER — LIDOCAINE HCL (PF) 1 % IJ SOLN
3.0000 mL | Freq: Once | INTRAMUSCULAR | Status: AC
  Administered 2020-01-14: 3 mL

## 2020-01-14 MED ORDER — LISINOPRIL 20 MG PO TABS: 10.0000 mg | ORAL_TABLET | Freq: Every day | ORAL | 3 refills | Status: DC

## 2020-01-14 MED ORDER — TRIAMCINOLONE ACETONIDE 40 MG/ML IJ SUSP
40.0000 mg | Freq: Once | INTRAMUSCULAR | Status: AC
  Administered 2020-01-14: 40 mg via INTRA_ARTICULAR

## 2020-01-14 NOTE — Patient Instructions (Addendum)
Please return in 6 months for your annual complete physical; please come fasting.  I have referred you to pulmonology to monitor your pulmonary hypertension.  Please see your cardiologist as well. Please decrease your lisinopril back down to 10mg  daily to avoid low blood pressures.  I have also referred you to gastroenterology to get your next due colonoscopy. We should be calling you to get these appointments scheduled. Let me know if you don't hear from [place].   If you have any questions or concerns, please don't hesitate to send me a message via MyChart or call the office at [phone]. Thank you for visiting with [place] today! It's our pleasure caring for you.  You had a steroid injection today.   Things to be aware of after this injection are listed below:  You may experience no significant improvement or even a slight worsening in your symptoms during the first 24 to 48 hours.  After that we expect your symptoms to improve gradually over the next 2 weeks for the medicine to have its maximal effect.  You should continue to have improvement out to 6 weeks after your injection.  I recommend icing the site of the injection for 20 minutes  1-2 times the day of your injection  You may shower but no swimming, tub bath or Jacuzzi for 24 hours.  If your bandage falls off this does not need to be replaced.  It is appropriate to remove the bandage after 4 hours.  You may resume light activities as tolerated.     POSSIBLE PROCEDURE SIDE EFFECTS: The side effects of the injection are usually fairly minimal however if you may experience some of the following side effects that are usually self-limited and will is off on their own.  If you are concerned please feel free to call the office with questions:             Increased numbness or tingling             Nausea or vomiting             Swelling or bruising at the injection site    Please call our office if if you experience any of the following  symptoms over the next week as these can be signs of infection:              Fever greater than 100.58F             Significant swelling at the injection site             Significant redness or drainage from the injection site    Hip Bursitis  Hip bursitis is inflammation of a fluid-filled sac (bursa) in the hip joint. The bursa prevents the bones in the hip joint from rubbing against each other. Hip bursitis can cause mild to moderate pain, and symptoms often come and go over time. What are the causes? This condition may be caused by:  Injury to the hip.  Overuse of the muscles that surround the hip joint.  Previous injury or surgery of the hip.  Arthritis or gout.  Diabetes.  Thyroid disease.  Infection. In some cases, the cause may not be known. What are the signs or symptoms? Symptoms of this condition include:  Mild or moderate pain in the hip area. Pain may get worse with movement.  Tenderness and swelling of the hip, especially on the outer side of the hip.  In rare cases, the bursa may become  infected. This may cause a fever, as well as warmth and redness in the area. Symptoms may come and go. How is this diagnosed? This condition may be diagnosed based on:  A physical exam.  Your medical history.  X-rays.  Removal of fluid from your inflamed bursa for testing (biopsy). You may be sent to a health care provider who specializes in bone diseases (orthopedist) or a provider who specializes in joint inflammation (rheumatologist). How is this treated? This condition is treated by resting, icing, applying pressure (compression), and raising (elevating) the injured area. This is called RICE treatment. In some cases, this may be enough to make your symptoms go away. Treatment may also include:  Using crutches.  Draining fluid out of the bursa to help relieve swelling.  Injecting medicine that helps to reduce inflammation (cortisone).  Additional medicines if the  bursa is infected. Follow these instructions at home: Managing pain, stiffness, and swelling   If directed, put ice on the painful area. ? Put ice in a plastic bag. ? Place a towel between your skin and the bag. ? Leave the ice on for 20 minutes, 2-3 times a day. ? Raise (elevate) your hip above the level of your heart as much as you can without pain. To do this, try putting a pillow under your hips while you lie down. Activity  Return to your normal activities as told by your health care provider. Ask your health care provider what activities are safe for you.  Rest and protect your hip as much as possible until your pain and swelling get better. General instructions  Take over-the-counter and prescription medicines only as told by your health care provider.  Wear compression wraps only as told by your health care provider.  Do not use your hip to support your body weight until your health care provider says that you can. Use crutches as told by your health care provider.  Gently massage and stretch your injured area as often as is comfortable.  Keep all follow-up visits as told by your health care provider. This is important. How is this prevented?  Exercise regularly, as told by your health care provider.  Warm up and stretch before being active.  Cool down and stretch after being active.  If an activity irritates your hip or causes pain, avoid the activity as much as possible.  Avoid sitting down for long periods at a time. Contact a health care provider if you:  Have a fever.  Develop new symptoms.  Have difficulty walking or doing everyday activities.  Have pain that gets worse or does not get better with medicine.  Develop red skin or a feeling of warmth in your hip area. Get help right away if you:  Cannot move your hip.  Have severe pain. Summary  Hip bursitis is inflammation of a fluid-filled sac (bursa) in the hip joint.  Hip bursitis can cause mild  to moderate pain, and symptoms often come and go over time.  This condition is treated with rest, ice, compression, elevation, and medicines. This information is not intended to replace advice given to you by your health care provider. Make sure you discuss any questions you have with your health care provider. Document Revised: 05/07/2018 Document Reviewed: 05/07/2018 Elsevier Patient Education  [place].

## 2020-01-14 NOTE — Progress Notes (Addendum)
Subjective  CC:  Chief Complaint  Patient presents with  . Hypertension    BP as low as 90/50's, highs 140/90's  . Hip Pain    bilateral, states pain has worsened over the past several months    HPI: Madison Clements is a 74 y.o. female who presents to the office today to address the problems listed above in the chief complaint.  Hypertension f/u: Control is good . Pt reports she is doing well. taking medications as instructed, no medication side effects noted, no TIAs, no chest pain on exertion, no dyspnea on exertion, no swelling of ankles. Has bp log with moslty low normal bps, but several lows 90s/50s and symptomatic. Highest readings are rare and intermittent w/ 140/80s-90.  She denies adverse effects from his BP medications. Compliance with medication is good.   Sob: worsening sob: with walking on incline or upstairs. No heaviness in chest or pressure. Has Pulmonary HTN on lasix. Does not have pulmonologist. Sees cards annually. I reviewed most recent notes and echocardiogram findings c/w worsening pulm HTN (moderate up from mild) and nl EF. Minimal CAD on cath 2016.   Reviewed chronic elevated lfts, mild and intermittent w/ neg hep C ab testing. No other w/u done to pt's knowledge. No abd pain or jaundice. On statin  HLD on statin - goal LDL < 70. Tolerating. Fasting today for recheck.   Hip pain bilateral: x 3 months. Worse with walking. Painful at times if lying on her side. No back pain or radicular pain or groin pain. Has chronic knee OA on daily celebrex but doesn't help with the hip pain. [place] a few weeks back but no change in pain after fall.   Adenomatous colon polyps due for repeat sureveillance colonoscopy. Needs referral to GI.   Assessment  1. Essential hypertension   2. Mixed hyperlipidemia   3. Primary osteoarthritis involving multiple joints   4. Pulmonary hypertension, primary ([place])   5. Dyspnea on exertion   6. Trochanteric bursitis of both hips   7.  Elevated LFTs   8. Adenomatous polyp of colon, unspecified part of colon      Plan    Hypertension f/u: BP control is well controlled. However with low bp; will decrease lisinopril back to 10mg  daily. Continue cardizem.   Hyperlipidemia f/u: recheck lipids on statin. Monitor elevated lfts; consider ultrasound.   Dyspnea, worsening. Suspect due to pulm HTN. Will f/u with cards and refer to pulm as well. Continue lasix.   Hip bursitis: educated. Ice, rest, stretching demonstrated. See HO. S/p steroid injections.   Refer to gi for due colonoscopy surveillance   Education regarding management of these chronic disease states was given. Management strategies discussed on successive visits include dietary and exercise recommendations, goals of achieving and maintaining IBW, and lifestyle modifications aiming for adequate sleep and minimizing stressors.   Follow up: Return in about 6 months (around 07/16/2020) for complete physical, follow up Hypertension.  Orders Placed This Encounter  Procedures  . Comprehensive metabolic panel  . Lipid panel  . Ambulatory referral to Pulmonology  . Ambulatory referral to Gastroenterology   Meds ordered this encounter  Medications  . lisinopril (ZESTRIL) 20 MG tablet    Sig: Take 0.5 tablets (10 mg total) by mouth daily.    Dispense:  90 tablet    Refill:  3      BP Readings from Last 3 Encounters:  01/14/20 110/60  07/16/19 126/78  04/29/19 126/74   Wt Readings from  Last 3 Encounters:  01/14/20 160 lb 6.4 oz (72.8 kg)  07/16/19 159 lb 6.4 oz (72.3 kg)  04/29/19 159 lb 6.4 oz (72.3 kg)    Lab Results  Component Value Date   CHOL 160 07/16/2019   CHOL 172 04/29/2019   CHOL 181 02/13/2019   Lab Results  Component Value Date   HDL 40.80 07/16/2019   HDL 43.50 04/29/2019   HDL 45.20 02/13/2019   Lab Results  Component Value Date   LDLCALC 101 07/27/2018   Lab Results  Component Value Date   TRIG 234.0 (H) 07/16/2019   TRIG  248.0 (H) 04/29/2019   TRIG 234.0 (H) 02/13/2019   Lab Results  Component Value Date   CHOLHDL 4 07/16/2019   CHOLHDL 4 04/29/2019   CHOLHDL 4 02/13/2019   Lab Results  Component Value Date   LDLDIRECT 78.0 07/16/2019   LDLDIRECT 93.0 04/29/2019   LDLDIRECT 101.0 02/13/2019   Lab Results  Component Value Date   CREATININE 0.94 07/16/2019   BUN 18 07/16/2019   NA 136 07/16/2019   K 4.0 07/16/2019   CL 101 07/16/2019   CO2 25 07/16/2019    The 10-year ASCVD risk score Mikey Bussing DC Jr., et al., 2013) is: 17.2%   Values used to calculate the score:     Age: 27 years     Sex: Female     Is Non-Hispanic African American: No     Diabetic: No     Tobacco smoker: No     Systolic Blood Pressure: A999333 mmHg     Is BP treated: Yes     HDL Cholesterol: 40.8 mg/dL     Total Cholesterol: 160 mg/dL  I reviewed the patients updated PMH, FH, and SocHx.    Patient Active Problem List   Diagnosis Date Noted  . CAD (coronary artery disease), native coronary artery 02/07/2019    Priority: High  . Essential hypertension 10/02/2018    Priority: High  . Mixed hyperlipidemia 10/02/2018    Priority: High  . Pulmonary hypertension, primary ([place]) 10/02/2018    Priority: High  . History of melanoma 10/02/2018    Priority: High  . Polyp of colon, adenomatous 10/02/2018    Priority: High  . GERD (gastroesophageal reflux disease) 10/02/2018    Priority: Medium  . Osteopenia 10/02/2018    Priority: Medium  . Primary osteoarthritis involving multiple joints 10/02/2018    Priority: Medium  . Gastric polyps 10/02/2018    Priority: Medium  . Elevated LFTs 01/14/2020    Allergies: Morphine and Morphine and related  Social History: Patient  reports that she quit smoking about 23 years ago. Her smoking use included cigarettes. She has never used smokeless tobacco.  Current Meds  Medication Sig  . aspirin EC 81 MG tablet Take 81 mg by mouth daily.  . celecoxib (CELEBREX) 100 MG capsule Take 1  capsule (100 mg total) by mouth daily.  Marland Kitchen diltiazem (TIAZAC) 180 MG 24 hr capsule Take 1 capsule (180 mg total) by mouth daily.  . folic acid (FOLVITE) A999333 MCG tablet Take 400 mcg by mouth daily.  . furosemide (LASIX) 40 MG tablet Take 1 tablet (40 mg total) by mouth daily.  Marland Kitchen lisinopril (ZESTRIL) 20 MG tablet Take 0.5 tablets (10 mg total) by mouth daily.  Marland Kitchen omeprazole (PRILOSEC) 20 MG capsule Take 1 capsule (20 mg total) by mouth daily.  . Probiotic Product (ADVANCED PROBIOTIC PO) Take 1 capsule by mouth daily.  . rosuvastatin (CRESTOR) 40 MG  tablet Take 1 tablet (40 mg total) by mouth at bedtime.  Marland Kitchen Specialty Vitamins Products (ONE-A-DAY BONE STRENGTH PO) Take 1 tablet by mouth every other day.  . [DISCONTINUED] lisinopril (ZESTRIL) 20 MG tablet Take 1 tablet (20 mg total) by mouth daily.    Review of Systems: Cardiovascular: negative for chest pain, palpitations, leg swelling, orthopnea Respiratory: negative for SOB, wheezing or persistent cough Gastrointestinal: negative for abdominal pain Genitourinary: negative for dysuria or gross hematuria  Objective  Vitals: BP 110/60   Pulse 68   Temp 97.7 F (36.5 C) (Temporal)   Resp 15   Ht 5\' 1"  (1.549 m)   Wt 160 lb 6.4 oz (72.8 kg)   SpO2 96%   BMI 30.31 kg/m  General: no acute distress  Psych:  Alert and oriented, normal mood and affect HEENT:  Normocephalic, atraumatic, supple neck  Cardiovascular:  RRR without murmur. no edema Respiratory:  Good breath sounds bilaterally, CTAB with normal respiratory effort Skin:  Warm, no rashes Neurologic:   Mental status is normal Bilateral greater troch bursa are tender to palpation and reproduce pt's pain. Good ROM at bilateral hips w/o groin ttp. Nl gait  GR Trochanteric Bursa steroid injection  Procedure Note   Pre-operative Diagnosis: bilateral hip bursitis   Post-operative Diagnosis: same   Indications: pain   Anesthesia: cold spray   Procedure Details    Verbal consent  was obtained for the procedure. Universal time out done. The point of maximum tenderness was identified over the left hip and marked over the hip bursa. The skin prepped with alcohol and cold spray used for anesthesia. A needle was advanced into the bursa and the steroid/lido (20mg  kenalog: 1.18ml 1%lido w/o epi) was administered easily. the same procedure was then repeated on the right hip.    Complications:  None; patient tolerated the procedure well.   Commons side effects, risks, benefits, and alternatives for medications and treatment plan prescribed today were discussed, and the patient expressed understanding of the given instructions. Patient is instructed to call or message via MyChart if he/she has any questions or concerns regarding our treatment plan. No barriers to understanding were identified. We discussed Red Flag symptoms and signs in detail. Patient expressed understanding regarding what to do in case of urgent or emergency type symptoms.   Medication list was reconciled, printed and provided to the patient in AVS. Patient instructions and summary information was reviewed with the patient as documented in the AVS. This note was prepared with assistance of Dragon voice recognition software. Occasional wrong-word or sound-a-like substitutions may have occurred due to the inherent limitations of voice recognition software  This visit occurred during the SARS-CoV-2 public health emergency.  Safety protocols were in place, including screening questions prior to the visit, additional usage of staff PPE, and extensive cleaning of exam room while observing appropriate contact time as indicated for disinfecting solutions.

## 2020-01-16 NOTE — Progress Notes (Signed)
I've added the info to my note and yes, 20mg  into each bursa. Thanks!

## 2020-02-11 ENCOUNTER — Ambulatory Visit: Payer: Medicare Other | Admitting: Physician Assistant

## 2020-02-13 ENCOUNTER — Ambulatory Visit: Payer: Medicare Other | Admitting: Cardiology

## 2020-02-25 ENCOUNTER — Ambulatory Visit (INDEPENDENT_AMBULATORY_CARE_PROVIDER_SITE_OTHER): Payer: Medicare Other | Admitting: Physician Assistant

## 2020-02-25 ENCOUNTER — Encounter: Payer: Self-pay | Admitting: Physician Assistant

## 2020-02-25 ENCOUNTER — Other Ambulatory Visit: Payer: Self-pay

## 2020-02-25 DIAGNOSIS — L578 Other skin changes due to chronic exposure to nonionizing radiation: Secondary | ICD-10-CM | POA: Diagnosis not present

## 2020-02-25 DIAGNOSIS — Z85828 Personal history of other malignant neoplasm of skin: Secondary | ICD-10-CM | POA: Diagnosis not present

## 2020-02-25 DIAGNOSIS — I2510 Atherosclerotic heart disease of native coronary artery without angina pectoris: Secondary | ICD-10-CM | POA: Diagnosis not present

## 2020-02-25 DIAGNOSIS — Z86018 Personal history of other benign neoplasm: Secondary | ICD-10-CM | POA: Diagnosis not present

## 2020-02-25 DIAGNOSIS — Z1283 Encounter for screening for malignant neoplasm of skin: Secondary | ICD-10-CM

## 2020-02-25 DIAGNOSIS — L570 Actinic keratosis: Secondary | ICD-10-CM | POA: Diagnosis not present

## 2020-02-25 NOTE — Progress Notes (Signed)
   Follow-Up Visit   Subjective  Madison Clements is a 74 y.o. female who presents for the following: Actinic Keratosis (LEFT SHIN LEFT WRIST).   The following portions of the chart were reviewed this encounter and updated as appropriate: Tobacco  Allergies  Meds  Problems  Med Hx  Surg Hx  Fam Hx      Objective  Well appearing patient in no apparent distress; mood and affect are within normal limits.  A full examination was performed including scalp, head, eyes, ears, nose, lips, neck, chest, axillae, abdomen, back, buttocks, bilateral upper extremities, bilateral lower extremities, hands, feet, fingers, toes, fingernails, and toenails. All findings within normal limits unless otherwise noted below.  Neck - Anterior  Images    Objective  Left Lower Leg - Anterior: Erythematous patches with gritty scale.   Assessment & Plan  History of SCC (squamous cell carcinoma) of skin Right Lower Leg - Anterior  History of dysplastic nevus Neck - Anterior  Actinic skin damage Head - Anterior (Face)  Screening exam for skin cancer Head - to Toe  AK (actinic keratosis) Left Lower Leg - Anterior  Destruction of lesion - Left Lower Leg - Anterior Complexity: simple   Destruction method: cryotherapy   Informed consent: discussed and consent obtained   Timeout:  patient name, date of birth, surgical site, and procedure verified Lesion destroyed using liquid nitrogen: Yes   Cryotherapy cycles:  1 Outcome: patient tolerated procedure well with no complications   Post-procedure details: wound care instructions given

## 2020-02-25 NOTE — Progress Notes (Signed)
Cardiology Office Note    Date:  02/26/2020   ID:  Madison Clements, DOB 04/13/1946, MRN [id number]  PCP:  Leamon Arnt, MD  Cardiologist: No primary care provider on file. EPS: None  No chief complaint on file.   History of Present Illness:  Madison Clements is a 74 y.o. female with history of hypertension, pulmonary hypertension, hyperlipidemia.  Echo 2018 [facility] normal LVEF 55 to 60% with grade 2 DD and mild to moderate TR mild pulmonary hypertension RVSP 45mmHg.  Repeat echo in 2019 unchanged.  NST 2015 no ischemia, cardiac cath 2016 [facility] mild CAD with calcified vessels.  Last had a telemedicine visit with Dr. Radford Pax 01/2019 which time she was doing well.  The echo 03/11/2019 normal LVEF with moderate TR mild to moderate elevated pulmonary pressures.  No changes made.  Patient saw PCP 01/14/2020 and lisinopril decreased because of low blood pressure.  Patient also had worsening dyspnea felt secondary to pulmonary hypertension referred back for follow-up and also referred to pulmonary.   Patient complaints of dyspnea with walking up a hill especially if carrying groceries, stairs.Started about 9 months ago and thought it was anxiety because she lost her husband and she moved here. No chest pain, palpitations, edema. Father with heart disease and CABG in 60's, pacer and defibrillator, afib. She smoked less than 1/2 ppd for 20 yrs but quit 1998, not a diabetic.  Past Medical History:  Diagnosis Date  . Arthritis   . Atypical mole 02/24/2015   RGHT LATERAL THIGH MODERATE  . Atypical nevi 02/24/2015   RIGHT NECK MILD  . Atypical nevi 08/25/2015   LEFT UPPER ARM MILD  . Atypical nevi 08/25/2015   LEFT LATERAL FOREARM MILD/FREE  . Atypical nevi 02/23/2016   LEFT THIGH MODERATE/FREE  . Atypical nevi 02/23/2016   LEFT MEDIAL LEG MODERATE/FREE  . Atypical nevi 02/23/2016   LEFT UPPER BACK MILD /FREE  . Atypical nevi 08/23/2016   RIGHT ANT PROX  THIGH MILD /FREE  . Atypical nevi 07/25/2018   MID LOWER BACK MODERATE  . Atypical nevi 01/22/2019   LEFT MID BACK MILD /FREE  . Atypical nevi 01/22/2019   LEFT NECK MILD  . Atypical nevi 08/06/2019   LEFT INNER BREAST MODERATE W/S  . Atypical nevi 08/06/2019   LEFT OUTER SIDE MILD/FREE  . CAD (coronary artery disease), native coronary artery 02/07/2019   Minimal CAD at the time of cath in 2016 in [facility]  . Gastric polyps 10/02/2018   EGD 03/2015; benign  . GERD (gastroesophageal reflux disease) 10/02/2018  . History of melanoma 10/02/2018   Abdomen 2012; 2013; s/p local excisions.   . Hyperlipidemia   . Hypertension   . Osteopenia   . Polyp of colon, adenomatous 10/02/2018   Colonoscopy 03/2015; recheck in 2021  . Pulmonary hypertension, primary ([place]) 10/02/2018  . SCCA (squamous cell carcinoma) of skin 09/18/2017   RIGHT ANT. DISTAL LOWER LEG TREATED BY DR. Delton Coombes    Past Surgical History:  Procedure Laterality Date  . ABDOMINAL HYSTERECTOMY    . APPENDECTOMY  1962  . BLADDER SUSPENSION    . MENISCUS REPAIR  2001  . thumb surgery      Current Medications: Current Meds  Medication Sig  . aspirin EC 81 MG tablet Take 81 mg by mouth daily.  . celecoxib (CELEBREX) 100 MG capsule Take 1 capsule (100 mg total) by mouth daily.  Marland Kitchen diltiazem (TIAZAC) 180 MG 24 hr capsule  Take 1 capsule (180 mg total) by mouth daily.  . folic acid (FOLVITE) 962 MCG tablet Take 400 mcg by mouth daily.  . furosemide (LASIX) 40 MG tablet Take 1 tablet (40 mg total) by mouth daily.  Marland Kitchen lisinopril (ZESTRIL) 20 MG tablet Take 0.5 tablets (10 mg total) by mouth daily.  Marland Kitchen omeprazole (PRILOSEC) 20 MG capsule Take 1 capsule (20 mg total) by mouth daily.  . Probiotic Product (ADVANCED PROBIOTIC PO) Take 1 capsule by mouth daily.  . rosuvastatin (CRESTOR) 40 MG tablet Take 1 tablet (40 mg total) by mouth at bedtime.  Marland Kitchen Specialty Vitamins Products (ONE-A-DAY BONE STRENGTH PO) Take 1 tablet by  mouth every other day.     Allergies:   Morphine and Morphine and related   Social History   Socioeconomic History  . Marital status: Widowed    Spouse name: Not on file  . Number of children: Not on file  . Years of education: Not on file  . Highest education level: Not on file  Occupational History  . Occupation: Retired [employer]  Tobacco Use  . Smoking status: Former Smoker    Types: Cigarettes    Quit date: 09/12/1996    Years since quitting: 23.4  . Smokeless tobacco: Never Used  Vaping Use  . Vaping Use: Never used  Substance and Sexual Activity  . Alcohol use: Not Currently  . Drug use: Never  . Sexual activity: Not on file  Other Topics Concern  . Not on file  Social History Narrative   Widowed 2018 after 73 years of marriage   One daughter, 2 grandchildren. Lives in [place]   Sister lives in same complex in [place]    Previously lived in [place], [place]   Social Determinants of Health   Financial Resource Strain:   . Difficulty of Paying Living Expenses:   Food Insecurity:   . Worried About Charity fundraiser in the Last Year:   . Arboriculturist in the Last Year:   Transportation Needs:   . Film/video editor (Medical):   Marland Kitchen Lack of Transportation (Non-Medical):   Physical Activity:   . Days of Exercise per Week:   . Minutes of Exercise per Session:   Stress:   . Feeling of Stress :   Social Connections:   . Frequency of Communication with Friends and Family:   . Frequency of Social Gatherings with Friends and Family:   . Attends Religious Services:   . Active Member of Clubs or Organizations:   . Attends Archivist Meetings:   Marland Kitchen Marital Status:      Family History:  The patient's   family history includes Arthritis in her mother, paternal grandmother, and sister; Asthma in her maternal grandfather; Cancer in her sister; Diabetes in her sister; Early death in her maternal grandmother; Heart attack in her father; Heart disease in her  father and paternal grandmother; High Cholesterol in her sister; High blood pressure in her father, maternal grandmother, mother, paternal grandmother, and sister; Kidney disease in her maternal grandfather and paternal grandfather.   ROS:   Please see the history of present illness.    ROS All other systems reviewed and are negative.   PHYSICAL EXAM:   VS:  BP 130/60   Pulse 76   Ht 5\' 1"  (1.549 m)   Wt 158 lb 12.8 oz (72 kg)   SpO2 98%   BMI 30.00 kg/m   Physical Exam  GEN: Well nourished, well  developed, in no acute distress  Neck: no JVD, carotid bruits, or masses Cardiac:RRR; , 1/6 systolic murmur at the left sternal border Respiratory:  clear to auscultation bilaterally, normal work of breathing GI: soft, nontender, nondistended, + BS Ext: without cyanosis, clubbing, or edema, Good distal pulses bilaterally Neuro:  Alert and Oriented x 3  Psych: euthymic mood, full affect  Wt Readings from Last 3 Encounters:  02/26/20 158 lb 12.8 oz (72 kg)  01/14/20 160 lb 6.4 oz (72.8 kg)  07/16/19 159 lb 6.4 oz (72.3 kg)      Studies/Labs Reviewed:   EKG:  EKG is  ordered today.  The ekg ordered today demonstrates normal sinus rhythm with nonspecific ST-T wave changes  Recent Labs: 07/16/2019: Hemoglobin 14.8; Platelets 256.0; TSH 1.92 01/14/2020: ALT 41; BUN 21; Creatinine, Ser 1.17; Potassium 4.2; Sodium 138   Lipid Panel    Component Value Date/Time   CHOL 162 01/14/2020 0904   TRIG 248.0 (H) 01/14/2020 0904   HDL 55.30 01/14/2020 0904   CHOLHDL 3 01/14/2020 0904   VLDL 49.6 (H) 01/14/2020 0904   LDLCALC 101 07/27/2018 0000   LDLDIRECT 68.0 01/14/2020 0904    Additional studies/ records that were reviewed today include:  2D echo 03/11/2019 IMPRESSIONS     1. The left ventricle has normal systolic function with an ejection  fraction of 60-65%. The cavity size was normal. There is mildly increased  left ventricular wall thickness. Left ventricular diastolic Doppler   parameters are consistent with impaired  relaxation.   2. The mitral valve is grossly normal.   3. The tricuspid valve is grossly normal. Tricuspid valve regurgitation  is moderate.   4. The aortic valve is tricuspid. Mild thickening of the aortic valve. No  stenosis of the aortic valve.   5. Normal LV systolic function; mild diastolic dysfunction; mild LVH;  mild RVE; moderate TR with mildly to moderately elevated pulmonary  pressure.   FINDINGS   Left Ventricle: The left ventricle has normal systolic function, with an  ejection fraction of 60-65%. The cavity size was normal. There is mildly  increased left ventricular wall thickness. Left ventricular diastolic  Doppler parameters are consistent  with impaired relaxation.   Right Ventricle: The right ventricle has normal systolic function. The  cavity was normal.   Left Atrium: Left atrial size was normal in size.   Right Atrium: Right atrial size was normal in size. Right atrial pressure  is estimated at 3 mmHg.    Pericardium: There is no evidence of pericardial effusion.   Mitral Valve: The mitral valve is grossly normal. Mitral valve  regurgitation is trivial by color flow Doppler.   Tricuspid Valve: The tricuspid valve is grossly normal. Tricuspid valve  regurgitation is moderate by color flow Doppler.   Aortic Valve: The aortic valve is tricuspid Mild thickening of the aortic  valve. Aortic valve regurgitation was not visualized by color flow  Doppler. There is No stenosis of the aortic valve.   Pulmonic Valve: The pulmonic valve was normal in structure. Pulmonic valve  regurgitation is not visualized by color flow Doppler.   Venous: The inferior vena cava is normal in size with greater than 50%  respiratory variability.   Additional Comments: Normal LV systolic function; mild diastolic  dysfunction; mild LVH; mild RVE; moderate TR with mildly to moderately  elevated pulmonary pressure.          ASSESSMENT:    1. Dyspnea on exertion   2. Pulmonary hypertension, primary (  [place])   3. 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 Hyperlipidemia, unspecified hyperlipidemia type      PLAN:  In order of problems listed above:  Dyspnea on exertion has progressively worsened over the past 9 months with going up a hill stairs or carrying anything.  We will update echo to reassess pulmonary hypertension.  No evidence of heart failure on exam.  Also order Lexiscan Myoview to rule out ischemia.  Cardiac cath in [facility] in 2016 mild CAD  Mild to moderate pulmonary hypertension on echo 02/2019 chronic diuretics will update 2D echo with worsening dyspnea on exertion  Mild CAD on cath in 2016 [facility] on aspirin and Lipitor now with dyspnea on exertion.  Will order Lexiscan Myoview-multiple CV risk factors including strong family history, HTN, HLD  Hyperlipidemia LDL goal less than 70 LDL 68 01/14/2020 but triglycerides 248 add Vascepa    Medication Adjustments/Labs and Tests Ordered: Current medicines are reviewed at length with the patient today.  Concerns regarding medicines are outlined above.  Medication changes, Labs and Tests ordered today are listed in the Patient Instructions below. Patient Instructions  Medication Instructions:  Your physician has recommended you make the following change in your medication:   START: vascepa 2 grams by mouth twice a day  *If you need a refill on your cardiac medications before your next appointment, please call your pharmacy*   Lab Work: None  If you have labs (blood work) drawn today and your tests are completely normal, you will receive your results only by: Marland Kitchen MyChart Message (if you have MyChart) OR . A paper copy in the mail If you have any lab test that is abnormal or we need to change your treatment, we will call you to review the  results.   Testing/Procedures: Your physician has requested that you have an echocardiogram. Echocardiography is a painless test that uses sound waves to create images of your heart. It provides your doctor with information about the size and shape of your heart and how well your heart's chambers and valves are working. This procedure takes approximately one hour. There are no restrictions for this procedure.  Your physician has requested that you have a lexiscan myoview. For further information please visit [email or web address]. Please follow instruction sheet, as given.  Follow-Up: At [facility], you and your health needs are our priority.  As part of our continuing mission to provide you with exceptional heart care, we have created designated Provider Care Teams.  These Care Teams include your primary Cardiologist (physician) and Advanced Practice Providers (APPs -  Physician Assistants and Nurse Practitioners) who all work together to provide you with the care you need, when you need it.  We recommend signing up for the patient portal called "MyChart".  Sign up information is provided on this After Visit Summary.  MyChart is used to connect with patients for Virtual Visits (Telemedicine).  Patients are able to view lab/test results, encounter notes, upcoming appointments, etc.  Non-urgent messages can be sent to your provider as well.   To learn more about what you can do with MyChart, go to [email or web address].    Your next appointment:   2 month(s)  The format for your next appointment:   In Person  Provider:   You may see Fransico Him, MD or one of the following Advanced Practice Providers on your designated Care Team:    Melina Copa, PA-C  Selinda Eon  Bonnell Public, PA-C    Other Instructions None     Signed, Ermalinda Barrios, PA-C  02/26/2020 12:56 PM    [facility] [address], [place], [place]  [address] Phone: [phone]; Fax: [phone]

## 2020-02-26 ENCOUNTER — Encounter: Payer: Self-pay | Admitting: Physician Assistant

## 2020-02-26 ENCOUNTER — Ambulatory Visit (INDEPENDENT_AMBULATORY_CARE_PROVIDER_SITE_OTHER): Payer: Medicare Other | Admitting: Physician Assistant

## 2020-02-26 VITALS — BP 130/60 | HR 76 | Ht 61.0 in | Wt 158.8 lb

## 2020-02-26 DIAGNOSIS — E785 Hyperlipidemia, unspecified: Secondary | ICD-10-CM | POA: Diagnosis not present

## 2020-02-26 DIAGNOSIS — R0600 Dyspnea, unspecified: Secondary | ICD-10-CM

## 2020-02-26 DIAGNOSIS — I270 Primary pulmonary hypertension: Secondary | ICD-10-CM | POA: Diagnosis not present

## 2020-02-26 DIAGNOSIS — I2510 Atherosclerotic heart disease of native coronary artery without angina pectoris: Secondary | ICD-10-CM

## 2020-02-26 DIAGNOSIS — R0609 Other forms of dyspnea: Secondary | ICD-10-CM

## 2020-02-26 MED ORDER — ICOSAPENT ETHYL 1 G PO CAPS
2.0000 g | ORAL_CAPSULE | Freq: Two times a day (BID) | ORAL | 3 refills | Status: DC
Start: 2020-02-26 — End: 2020-03-09

## 2020-02-26 NOTE — Patient Instructions (Signed)
Medication Instructions:  Your physician has recommended you make the following change in your medication:   START: vascepa 2 grams by mouth twice a day  *If you need a refill on your cardiac medications before your next appointment, please call your pharmacy*   Lab Work: None  If you have labs (blood work) drawn today and your tests are completely normal, you will receive your results only by:  [place] (if you have MyChart) OR  A paper copy in the mail If you have any lab test that is abnormal or we need to change your treatment, we will call you to review the results.   Testing/Procedures: Your physician has requested that you have an echocardiogram. Echocardiography is a painless test that uses sound waves to create images of your heart. It provides your doctor with information about the size and shape of your heart and how well your hearts chambers and valves are working. This procedure takes approximately one hour. There are no restrictions for this procedure.  Your physician has requested that you have a lexiscan myoview. For further information please visit [email or web address]. Please follow instruction sheet, as given.  Follow-Up: At [facility], you and your health needs are our priority.  As part of our continuing mission to provide you with exceptional heart care, we have created designated Provider Care Teams.  These Care Teams include your primary Cardiologist (physician) and Advanced Practice Providers (APPs -  Physician Assistants and Nurse Practitioners) who all work together to provide you with the care you need, when you need it.  We recommend signing up for the patient portal called "MyChart".  Sign up information is provided on this After Visit Summary.  MyChart is used to connect with patients for Virtual Visits (Telemedicine).  Patients are able to view lab/test results, encounter notes, upcoming appointments, etc.  Non-urgent messages can be sent to your  provider as well.   To learn more about what you can do with MyChart, go to [email or web address].    Your next appointment:   2 month(s)  The format for your next appointment:   In Person  Provider:   You may see Fransico Him, MD or one of the following Advanced Practice Providers on your designated Care Team:    Melina Copa, PA-C  Ermalinda Barrios, PA-C    Other Instructions None

## 2020-02-27 ENCOUNTER — Telehealth: Payer: Self-pay | Admitting: Internal Medicine

## 2020-02-27 NOTE — Telephone Encounter (Signed)
We have received a referral from pt's PCP on 02/27/20 requesting pt being seen for a repeat EGD and colon. Pt had a EGD and colon in 2016. Records have been received and will be sent to you for review. Pt would like to continue her GI care with you. Please advise on scheduling.Thank you.

## 2020-03-03 ENCOUNTER — Telehealth: Payer: Self-pay | Admitting: Internal Medicine

## 2020-03-03 NOTE — Telephone Encounter (Signed)
I will check after clinic and get back to you

## 2020-03-03 NOTE — Telephone Encounter (Signed)
Madison Clements, could you update [place] on the status of this referral.

## 2020-03-05 NOTE — Telephone Encounter (Signed)
Past records REVIEWED.  Colonoscopy and upper endoscopy in [place] March 26, 2015. 1.  Colonoscopy with adenomatous colon polyps in the cecum and rectum 2.  Upper endoscopy with minimal findings negative biopsies  I will see the patient in my office to establish care.  Thank you.  Dr. Henrene Pastor

## 2020-03-09 ENCOUNTER — Telehealth: Payer: Self-pay

## 2020-03-09 MED ORDER — FISH OIL 1000 MG PO CAPS: 1000.0000 mg | ORAL_CAPSULE | Freq: Two times a day (BID) | ORAL | 0 refills | Status: DC

## 2020-03-09 NOTE — Telephone Encounter (Signed)
**Note De-Identified Dama Hedgepeth Obfuscation** Per the pts advise request I started a Vascepa tier exception through covermymeds: Key: [facility]

## 2020-03-11 ENCOUNTER — Telehealth (HOSPITAL_COMMUNITY): Payer: Self-pay

## 2020-03-11 NOTE — Telephone Encounter (Signed)
Spoke with the patient, detailed instructions were given. She stated that she understood and would be here for her test. Asked to call back with any questions. S.Ronav Furney EMTP 

## 2020-03-12 NOTE — Telephone Encounter (Signed)
Hi Magda Paganini, could you please send me this pt's records with Pamala Hurry when you have a chance?

## 2020-03-17 ENCOUNTER — Ambulatory Visit (HOSPITAL_COMMUNITY): Payer: Medicare Other | Attending: Internal Medicine

## 2020-03-17 ENCOUNTER — Ambulatory Visit (HOSPITAL_BASED_OUTPATIENT_CLINIC_OR_DEPARTMENT_OTHER): Payer: Medicare Other

## 2020-03-17 ENCOUNTER — Other Ambulatory Visit: Payer: Self-pay

## 2020-03-17 DIAGNOSIS — R0600 Dyspnea, unspecified: Secondary | ICD-10-CM | POA: Diagnosis not present

## 2020-03-17 DIAGNOSIS — I270 Primary pulmonary hypertension: Secondary | ICD-10-CM | POA: Insufficient documentation

## 2020-03-17 DIAGNOSIS — R0609 Other forms of dyspnea: Secondary | ICD-10-CM

## 2020-03-17 LAB — MYOCARDIAL PERFUSION IMAGING
LV dias vol: 40 mL (ref 46–106)
LV sys vol: 9 mL
Peak HR: 88 {beats}/min
Rest HR: 58 {beats}/min
SDS: 0
SRS: 0
SSS: 0
TID: 0.96

## 2020-03-17 LAB — ECHOCARDIOGRAM COMPLETE
Height: 61 in
Weight: 2528 oz

## 2020-03-17 MED ORDER — TECHNETIUM TC 99M TETROFOSMIN IV KIT
32.7000 | PACK | Freq: Once | INTRAVENOUS | Status: AC | PRN
  Administered 2020-03-17: 32.7 via INTRAVENOUS
  Filled 2020-03-17: qty 33

## 2020-03-17 MED ORDER — REGADENOSON 0.4 MG/5ML IV SOLN
0.4000 mg | Freq: Once | INTRAVENOUS | Status: AC
  Administered 2020-03-17: 0.4 mg via INTRAVENOUS

## 2020-03-17 MED ORDER — TECHNETIUM TC 99M TETROFOSMIN IV KIT
10.2000 | PACK | Freq: Once | INTRAVENOUS | Status: AC | PRN
  Administered 2020-03-17: 10.2 via INTRAVENOUS
  Filled 2020-03-17: qty 11

## 2020-03-17 NOTE — Telephone Encounter (Signed)
Just put these in the bin for [facility]

## 2020-03-18 ENCOUNTER — Encounter: Payer: Self-pay | Admitting: Internal Medicine

## 2020-03-18 NOTE — Telephone Encounter (Signed)
I got them. Thank you. Left message for pt to call back to schedule ov.

## 2020-03-26 ENCOUNTER — Telehealth: Payer: Self-pay | Admitting: Cardiology

## 2020-03-26 ENCOUNTER — Telehealth: Payer: Self-pay | Admitting: Family Medicine

## 2020-03-26 NOTE — Telephone Encounter (Signed)
Would have her make an appt with her PCP to exam

## 2020-03-26 NOTE — Telephone Encounter (Signed)
I spoke with patient. She started fish oil about 2 weeks ago.  Shortly after this she developed purple/red spots on her arms.  Describes as looking like a splattering of prickly looking spots.  She decreased fish oil to one tablet about 5 days ago.  Today she developed another small cluster of spots on her forearm.  Only new change is starting fish oil.  I told her it was OK to stop fish oil for now.  She is seeing Dr Radford Pax on August 24 and will follow up on this at that time.  I asked her to contact PCP if spots worsen or do not improve after stopping fish oil

## 2020-03-26 NOTE — Telephone Encounter (Signed)
Pt called asking if she could see Dr. Jonni Sanger on Monday for red splotches on arm. Pt states she thinks they may be from taking fish oil. Please advise.

## 2020-03-26 NOTE — Telephone Encounter (Signed)
I spoke with patient and gave her information from Dr Radford Pax.  She will stay off fish oil for now and contact PCP for appointment

## 2020-03-26 NOTE — Telephone Encounter (Signed)
Pt c/o medication issue:  1. Name of Medication: Omega-3 Fatty Acids (FISH OIL) 1000 MG CAPS  2. How are you currently taking this medication (dosage and times per day)? 2 capsules daily  3. Are you having a reaction (difficulty breathing--STAT)? no  4. What is your medication issue? Patient states she has been having red spots on her arms since starting the medication. She states she tried only taking one capsule daily, but that did not help. Please advise.

## 2020-03-27 NOTE — Telephone Encounter (Signed)
OK to use same day or refer to UC for possible allergic reaction? Please advise

## 2020-03-27 NOTE — Telephone Encounter (Signed)
Ok to use spot on Monday. thanks

## 2020-03-27 NOTE — Telephone Encounter (Signed)
Called and LVM for pt to schedule appt.

## 2020-03-30 ENCOUNTER — Encounter: Payer: Self-pay | Admitting: Family Medicine

## 2020-03-30 ENCOUNTER — Ambulatory Visit (INDEPENDENT_AMBULATORY_CARE_PROVIDER_SITE_OTHER): Payer: Medicare Other | Admitting: Family Medicine

## 2020-03-30 ENCOUNTER — Other Ambulatory Visit: Payer: Self-pay

## 2020-03-30 VITALS — BP 124/72 | HR 73 | Temp 97.1°F | Ht 61.0 in | Wt 158.6 lb

## 2020-03-30 DIAGNOSIS — R233 Spontaneous ecchymoses: Secondary | ICD-10-CM

## 2020-03-30 LAB — CBC WITH DIFFERENTIAL/PLATELET
Basophils Absolute: 0 10*3/uL (ref 0.0–0.1)
Basophils Relative: 0.3 % (ref 0.0–3.0)
Eosinophils Absolute: 0.1 10*3/uL (ref 0.0–0.7)
Eosinophils Relative: 1 % (ref 0.0–5.0)
HCT: 40.8 % (ref 36.0–46.0)
Hemoglobin: 13.9 g/dL (ref 12.0–15.0)
Lymphocytes Relative: 24.1 % (ref 12.0–46.0)
Lymphs Abs: 2.1 10*3/uL (ref 0.7–4.0)
MCHC: 34 g/dL (ref 30.0–36.0)
MCV: 92.9 fl (ref 78.0–100.0)
Monocytes Absolute: 0.6 10*3/uL (ref 0.1–1.0)
Monocytes Relative: 7.3 % (ref 3.0–12.0)
Neutro Abs: 5.8 10*3/uL (ref 1.4–7.7)
Neutrophils Relative %: 67.3 % (ref 43.0–77.0)
Platelets: 274 10*3/uL (ref 150.0–400.0)
RBC: 4.39 Mil/uL (ref 3.87–5.11)
RDW: 13.9 % (ref 11.5–15.5)
WBC: 8.7 10*3/uL (ref 4.0–10.5)

## 2020-03-30 LAB — SEDIMENTATION RATE: Sed Rate: 12 mm/hr (ref 0–30)

## 2020-03-30 NOTE — Patient Instructions (Addendum)
Please follow up as scheduled for your next visit with me: 07/28/2020   If you have any questions or concerns, please don't hesitate to send me a message via MyChart or call the office at [phone]. Thank you for visiting with [place] today! It's our pleasure caring for you.  Hold the aspirin and fish oil for the next 2-4 weeks. I'll check a few blood tests to ensure your blood counts are ok.

## 2020-03-30 NOTE — Progress Notes (Signed)
Subjective  CC:  Chief Complaint  Patient presents with  . Red spots on arm    red "bruises" on both arms. Started about a month ago. stopped taking fish oil    HPI: Madison Clements is a 74 y.o. female who presents to the office today to address the problems listed above in the chief complaint.  Has developed tiny red spots on bilateral forearms; noticed several weeks ago. Thought related to moving, however, they keep appearing then will fade. No pain, itching, flaking or spread. None noticed on legs or trunk. No arthralgias myalgias f/c/s or systemic sxs. Has one dark purple spot that has been present for weeks and hasn't changed. On asa daily and had started fish oil prior to "rash". She stopped that last week and since has developed new spots. No h/o vasculitis no recent abx. Feels fine.   Assessment  1. Petechiae      Plan   petechiae:  Check platelets. ? Vasculitis or other. Stop asa and fish oil and monitor. To derm if persists; f/u here if other sxs develop for further eval.   Follow up: prn  07/28/2020  Orders Placed This Encounter  Procedures  . CBC with Differential/Platelet  . Sedimentation rate   No orders of the defined types were placed in this encounter.     I reviewed the patients updated PMH, FH, and SocHx.    Patient Active Problem List   Diagnosis Date Noted  . Essential hypertension 10/02/2018    Priority: High  . Mixed hyperlipidemia 10/02/2018    Priority: High  . Pulmonary hypertension, primary ([place]) 10/02/2018    Priority: High  . History of melanoma 10/02/2018    Priority: High  . Polyp of colon, adenomatous 10/02/2018    Priority: High  . GERD (gastroesophageal reflux disease) 10/02/2018    Priority: Medium  . Osteopenia 10/02/2018    Priority: Medium  . Primary osteoarthritis involving multiple joints 10/02/2018    Priority: Medium  . Gastric polyps 10/02/2018    Priority: Medium  . Elevated LFTs 01/14/2020   Current Meds    Medication Sig  . aspirin EC 81 MG tablet Take 81 mg by mouth daily.  . celecoxib (CELEBREX) 100 MG capsule Take 1 capsule (100 mg total) by mouth daily.  Marland Kitchen diltiazem (TIAZAC) 180 MG 24 hr capsule Take 1 capsule (180 mg total) by mouth daily.  . folic acid (FOLVITE) 016 MCG tablet Take 400 mcg by mouth daily.  . furosemide (LASIX) 40 MG tablet Take 1 tablet (40 mg total) by mouth daily.  Marland Kitchen lisinopril (ZESTRIL) 20 MG tablet Take 0.5 tablets (10 mg total) by mouth daily.  Marland Kitchen omeprazole (PRILOSEC) 20 MG capsule Take 1 capsule (20 mg total) by mouth daily.  . Probiotic Product (ADVANCED PROBIOTIC PO) Take 1 capsule by mouth daily.  . rosuvastatin (CRESTOR) 40 MG tablet Take 1 tablet (40 mg total) by mouth at bedtime.  Marland Kitchen Specialty Vitamins Products (ONE-A-DAY BONE STRENGTH PO) Take 1 tablet by mouth every other day.    Allergies: Patient is allergic to morphine and morphine and related. Family History: Patient family history includes Arthritis in her mother, paternal grandmother, and sister; Asthma in her maternal grandfather; Cancer in her sister; Diabetes in her sister; Early death in her maternal grandmother; Heart attack in her father; Heart disease in her father and paternal grandmother; High Cholesterol in her sister; High blood pressure in her father, maternal grandmother, mother, paternal grandmother, and sister; Kidney disease  in her maternal grandfather and paternal grandfather. Social History:  Patient  reports that she quit smoking about 23 years ago. Her smoking use included cigarettes. She has never used smokeless tobacco. She reports previous alcohol use. She reports that she does not use drugs.  Review of Systems: Constitutional: Negative for fever malaise or anorexia Cardiovascular: negative for chest pain Respiratory: negative for SOB or persistent cough Gastrointestinal: negative for abdominal pain  Objective  Vitals: BP 124/72 (BP Location: Left Arm, Patient Position:  Sitting, Cuff Size: Normal)   Pulse 73   Temp (!) 97.1 F (36.2 C) (Temporal)   Ht 5\' 1"  (1.549 m)   Wt 158 lb 9.6 oz (71.9 kg)   SpO2 97%   BMI 29.97 kg/m  General: no acute distress , A&Ox3 HEENT: PEERL, conjunctiva normal, neck is supple Skin:  Warm, bilateral forearms with petechial rash. Non-blanchable. Nontender. Not palpable. No warmth. No flaking.  No other rashes present     Commons side effects, risks, benefits, and alternatives for medications and treatment plan prescribed today were discussed, and the patient expressed understanding of the given instructions. Patient is instructed to call or message via MyChart if he/she has any questions or concerns regarding our treatment plan. No barriers to understanding were identified. We discussed Red Flag symptoms and signs in detail. Patient expressed understanding regarding what to do in case of urgent or emergency type symptoms.   Medication list was reconciled, printed and provided to the patient in AVS. Patient instructions and summary information was reviewed with the patient as documented in the AVS. This note was prepared with assistance of Dragon voice recognition software. Occasional wrong-word or sound-a-like substitutions may have occurred due to the inherent limitations of voice recognition software  This visit occurred during the SARS-CoV-2 public health emergency.  Safety protocols were in place, including screening questions prior to the visit, additional usage of staff PPE, and extensive cleaning of exam room while observing appropriate contact time as indicated for disinfecting solutions.

## 2020-04-02 ENCOUNTER — Encounter: Payer: Self-pay | Admitting: Pulmonary Disease

## 2020-04-02 ENCOUNTER — Other Ambulatory Visit: Payer: Self-pay

## 2020-04-02 ENCOUNTER — Ambulatory Visit (INDEPENDENT_AMBULATORY_CARE_PROVIDER_SITE_OTHER): Payer: Medicare Other | Admitting: Pulmonary Disease

## 2020-04-02 ENCOUNTER — Ambulatory Visit (INDEPENDENT_AMBULATORY_CARE_PROVIDER_SITE_OTHER): Payer: Medicare Other

## 2020-04-02 VITALS — BP 122/66 | HR 62 | Temp 98.7°F | Ht 61.0 in | Wt 158.8 lb

## 2020-04-02 DIAGNOSIS — R0602 Shortness of breath: Secondary | ICD-10-CM

## 2020-04-02 DIAGNOSIS — I272 Pulmonary hypertension, unspecified: Secondary | ICD-10-CM

## 2020-04-02 DIAGNOSIS — I270 Primary pulmonary hypertension: Secondary | ICD-10-CM

## 2020-04-02 DIAGNOSIS — R0689 Other abnormalities of breathing: Secondary | ICD-10-CM | POA: Diagnosis not present

## 2020-04-02 DIAGNOSIS — R7989 Other specified abnormal findings of blood chemistry: Secondary | ICD-10-CM | POA: Diagnosis not present

## 2020-04-02 DIAGNOSIS — Z114 Encounter for screening for human immunodeficiency virus [HIV]: Secondary | ICD-10-CM | POA: Diagnosis not present

## 2020-04-02 DIAGNOSIS — Z7289 Other problems related to lifestyle: Secondary | ICD-10-CM

## 2020-04-02 DIAGNOSIS — E782 Mixed hyperlipidemia: Secondary | ICD-10-CM | POA: Diagnosis not present

## 2020-04-02 DIAGNOSIS — J181 Lobar pneumonia, unspecified organism: Secondary | ICD-10-CM | POA: Diagnosis not present

## 2020-04-02 DIAGNOSIS — I2510 Atherosclerotic heart disease of native coronary artery without angina pectoris: Secondary | ICD-10-CM

## 2020-04-02 DIAGNOSIS — R0600 Dyspnea, unspecified: Secondary | ICD-10-CM | POA: Diagnosis not present

## 2020-04-02 LAB — CBC WITH DIFFERENTIAL/PLATELET
Basophils Absolute: 0 10*3/uL (ref 0.0–0.1)
Basophils Relative: 0.7 % (ref 0.0–3.0)
Eosinophils Absolute: 0.1 10*3/uL (ref 0.0–0.7)
Eosinophils Relative: 0.8 % (ref 0.0–5.0)
HCT: 42 % (ref 36.0–46.0)
Hemoglobin: 14.5 g/dL (ref 12.0–15.0)
Lymphocytes Relative: 29.2 % (ref 12.0–46.0)
Lymphs Abs: 1.8 10*3/uL (ref 0.7–4.0)
MCHC: 34.5 g/dL (ref 30.0–36.0)
MCV: 92.4 fl (ref 78.0–100.0)
Monocytes Absolute: 0.5 10*3/uL (ref 0.1–1.0)
Monocytes Relative: 7.4 % (ref 3.0–12.0)
Neutro Abs: 3.9 10*3/uL (ref 1.4–7.7)
Neutrophils Relative %: 61.9 % (ref 43.0–77.0)
Platelets: 272 10*3/uL (ref 150.0–400.0)
RBC: 4.55 Mil/uL (ref 3.87–5.11)
RDW: 14 % (ref 11.5–15.5)
WBC: 6.2 10*3/uL (ref 4.0–10.5)

## 2020-04-02 LAB — HEPATIC FUNCTION PANEL
ALT: 36 U/L — ABNORMAL HIGH (ref 0–35)
AST: 30 U/L (ref 0–37)
Albumin: 4.6 g/dL (ref 3.5–5.2)
Alkaline Phosphatase: 86 U/L (ref 39–117)
Bilirubin, Direct: 0.2 mg/dL (ref 0.0–0.3)
Total Bilirubin: 1.3 mg/dL — ABNORMAL HIGH (ref 0.2–1.2)
Total Protein: 7.4 g/dL (ref 6.0–8.3)

## 2020-04-02 LAB — PROTIME-INR
INR: 1 ratio (ref 0.8–1.0)
Prothrombin Time: 11 s (ref 9.6–13.1)

## 2020-04-02 NOTE — Patient Instructions (Addendum)
We will get labs today including ANA, rheumatoid factor, CCP, scleroderma and Sjogren's antibody, HIV test, proBNP, PT INR, ANCA and hepatitis panel Chest x-ray today, PFTs, home sleep study   Follow-up in 1 to 2 months for review of results

## 2020-04-02 NOTE — Progress Notes (Signed)
Madison Clements    [id number]    1945-12-24  Primary Care Physician:Andy, Karie Fetch, MD  Referring Physician: Leamon Arnt, [address],  [place] [address]  Chief complaint: Consult for pulmonary hypertension  HPI: 74 year old with history of pulmonary hypertension, hyperlipidemia, skin cancer Referred for evaluation of pulmonary hypertension  This was initially diagnosed in 2010 in [place].  Previous echocardiogram with RVSP in the 30-40 range.  She was evaluated by pulmonologist in 2014 with normal PFTs and was told that she had mild OSA on sleep study that did not require treatment. More recently she has been having increasing dyspnea on exertion, lower extremity edema.  She has osteoarthritis.  Denies morning stiffness, difficulty swallowing, Raynaud's syndrome.  Recent echocardiogram with elevation in RVSP and has been referred to pulmonary for further evaluation.  She is also being evaluated for petechial rash by primary care  She also follows with Dr. Radford Pax from cardiology and is going to establish with Dr. Henrene Pastor from GI  Pets: No pets Occupation: Retired Arts development officer for [employer] Exposures: No known exposures.  No mold, hot tub, Jacuzzi.  No feather pillows or comforters Smoking history: 5-pack-year smoker.  Quit in 1998 Travel history: Previously lived in [place], [place].  No significant recent travel Relevant family history: No significant family history of lung disease  Outpatient Encounter Medications as of 04/02/2020  Medication Sig  . celecoxib (CELEBREX) 100 MG capsule Take 1 capsule (100 mg total) by mouth daily.  Marland Kitchen diltiazem (TIAZAC) 180 MG 24 hr capsule Take 1 capsule (180 mg total) by mouth daily.  . folic acid (FOLVITE) 130 MCG tablet Take 400 mcg by mouth daily.  . furosemide (LASIX) 40 MG tablet Take 1 tablet (40 mg total) by mouth daily.  Marland Kitchen lisinopril (ZESTRIL) 20 MG tablet Take 0.5 tablets (10 mg total) by  mouth daily. (Patient taking differently: Take 10 mg by mouth daily. Takes 10mg  daily)  . omeprazole (PRILOSEC) 20 MG capsule Take 1 capsule (20 mg total) by mouth daily.  . Probiotic Product (ADVANCED PROBIOTIC PO) Take 1 capsule by mouth daily.  . rosuvastatin (CRESTOR) 40 MG tablet Take 1 tablet (40 mg total) by mouth at bedtime.  Marland Kitchen Specialty Vitamins Products (ONE-A-DAY BONE STRENGTH PO) Take 1 tablet by mouth every other day.  Marland Kitchen aspirin EC 81 MG tablet Take 81 mg by mouth daily. (Patient not taking: Reported on 04/02/2020)  . Omega-3 Fatty Acids (FISH OIL) 1000 MG CAPS Take 1 capsule (1,000 mg total) by mouth in the morning and at bedtime. (Patient not taking: Reported on 03/30/2020)   No facility-administered encounter medications on file as of 04/02/2020.    Allergies as of 04/02/2020 - Review Complete 04/02/2020  Allergen Reaction Noted  . Morphine Rash 12/15/2015  . Morphine and related Nausea And Vomiting and Rash 10/02/2018    Past Medical History:  Diagnosis Date  . Arthritis   . Atypical mole 02/24/2015   RGHT LATERAL THIGH MODERATE  . Atypical nevi 02/24/2015   RIGHT NECK MILD  . Atypical nevi 08/25/2015   LEFT UPPER ARM MILD  . Atypical nevi 08/25/2015   LEFT LATERAL FOREARM MILD/FREE  . Atypical nevi 02/23/2016   LEFT THIGH MODERATE/FREE  . Atypical nevi 02/23/2016   LEFT MEDIAL LEG MODERATE/FREE  . Atypical nevi 02/23/2016   LEFT UPPER BACK MILD /FREE  . Atypical nevi 08/23/2016   RIGHT ANT PROX THIGH MILD /FREE  . Atypical  nevi 07/25/2018   MID LOWER BACK MODERATE  . Atypical nevi 01/22/2019   LEFT MID BACK MILD /FREE  . Atypical nevi 01/22/2019   LEFT NECK MILD  . Atypical nevi 08/06/2019   LEFT INNER BREAST MODERATE W/S  . Atypical nevi 08/06/2019   LEFT OUTER SIDE MILD/FREE  . CAD (coronary artery disease), native coronary artery 02/07/2019   Minimal CAD at the time of cath in 2016 in [facility]  . Gastric polyps 10/02/2018   EGD 03/2015;  benign  . GERD (gastroesophageal reflux disease) 10/02/2018  . History of melanoma 10/02/2018   Abdomen 2012; 2013; s/p local excisions.   . Hyperlipidemia   . Hypertension   . Osteopenia   . Polyp of colon, adenomatous 10/02/2018   Colonoscopy 03/2015; recheck in 2021  . Pulmonary hypertension, primary ([place]) 10/02/2018  . SCCA (squamous cell carcinoma) of skin 09/18/2017   RIGHT ANT. DISTAL LOWER LEG TREATED BY DR. Delton Coombes    Past Surgical History:  Procedure Laterality Date  . ABDOMINAL HYSTERECTOMY    . APPENDECTOMY  1962  . BLADDER SUSPENSION    . MENISCUS REPAIR  2001  . thumb surgery      Family History  Problem Relation Age of Onset  . Arthritis Mother   . High blood pressure Mother   . Heart attack Father   . Heart disease Father   . High blood pressure Father   . Arthritis Sister   . Diabetes Sister   . High Cholesterol Sister   . High blood pressure Sister   . Cancer Sister   . Early death Maternal Grandmother        Stroke at 23  . High blood pressure Maternal Grandmother   . Asthma Maternal Grandfather   . Kidney disease Maternal Grandfather   . Arthritis Paternal Grandmother   . Heart disease Paternal Grandmother   . High blood pressure Paternal Grandmother   . Kidney disease Paternal Grandfather     Social History   Socioeconomic History  . Marital status: Widowed    Spouse name: Not on file  . Number of children: Not on file  . Years of education: Not on file  . Highest education level: Not on file  Occupational History  . Occupation: Retired [employer]  Tobacco Use  . Smoking status: Former Smoker    Types: Cigarettes    Quit date: 09/12/1996    Years since quitting: 23.5  . Smokeless tobacco: Never Used  Vaping Use  . Vaping Use: Never used  Substance and Sexual Activity  . Alcohol use: Not Currently  . Drug use: Never  . Sexual activity: Not on file  Other Topics Concern  . Not on file  Social History Narrative   Widowed 2018 after 30 years  of marriage   One daughter, 2 grandchildren. Lives in [place]   Sister lives in same complex in [place]    Previously lived in [place], [place]   Social Determinants of Health   Financial Resource Strain:   . Difficulty of Paying Living Expenses:   Food Insecurity:   . Worried About Charity fundraiser in the Last Year:   . Arboriculturist in the Last Year:   Transportation Needs:   . Film/video editor (Medical):   Marland Kitchen Lack of Transportation (Non-Medical):   Physical Activity:   . Days of Exercise per Week:   . Minutes of Exercise per Session:   Stress:   . Feeling of  Stress :   Social Connections:   . Frequency of Communication with Friends and Family:   . Frequency of Social Gatherings with Friends and Family:   . Attends Religious Services:   . Active Member of Clubs or Organizations:   . Attends Archivist Meetings:   Marland Kitchen Marital Status:   Intimate Partner Violence:   . Fear of Current or Ex-Partner:   . Emotionally Abused:   Marland Kitchen Physically Abused:   . Sexually Abused:     Review of systems: Review of Systems  Constitutional: Negative for fever and chills.  HENT: Negative.   Eyes: Negative for blurred vision.  Respiratory: as per HPI  Cardiovascular: Negative for chest pain and palpitations.  Gastrointestinal: Negative for vomiting, diarrhea, blood per rectum. Genitourinary: Negative for dysuria, urgency, frequency and hematuria.  Musculoskeletal: Negative for myalgias, back pain and joint pain.  Skin: Negative for itching and rash.  Neurological: Negative for dizziness, tremors, focal weakness, seizures and loss of consciousness.  Endo/Heme/Allergies: Negative for environmental allergies.  Psychiatric/Behavioral: Negative for depression, suicidal ideas and hallucinations.  All other systems reviewed and are negative.  Physical Exam: Blood pressure 122/66, pulse 62, temperature 98.7 F (37.1 C), temperature source Oral, height 5\' 1"  (1.549 m), weight  158 lb 12.8 oz (72 kg), SpO2 99 %. Gen:      No acute distress HEENT:  EOMI, sclera anicteric Neck:     No masses; no thyromegaly Lungs:    Clear to auscultation bilaterally; normal respiratory effort CV:         Regular rate and rhythm; no murmurs Abd:      + bowel sounds; soft, non-tender; no palpable masses, no distension Ext:    No edema; adequate peripheral perfusion Skin:      Warm and dry; no rash Neuro: alert and oriented x 3 Psych: normal mood and affect  Data Reviewed: Imaging:   PFTs:  Labs: CMP from 01/14/2020-significant for slightly elevated transaminases and total bilirubin  Cardiac: Echocardiogram 03/17/2020-LVEF 60 to 16%, grade 1 diastolic dysfunction, moderately elevated RVSP of 46.2  Assessment:  Pulmonary hypertension May be secondary to diastolic dysfunction.  Although RVSP appears stable from 2020 she has increasing dyspnea She does not appear to have significant pulmonary issues Evaluate lungs with chest x-ray, PFTs She had mild sleep apnea in the past.  Ordered home sleep study  Check labs for autoimmune, vasculitis, HIV, BNP Noted to have slight elevation in LFTs of unclear etiology.  Check PT/INR and hepatitis panel She has an upcoming appointment with GI where this can be evaluated further.  We discussed with Dr. Radford Pax if she can get a right heart cath  Plan/Recommendations: Chest x-ray, PFTs, sleep study Labs for autoimmune, vasculitis, HIV, BNP Evaluate elevated LFTs with PT/INR, hepatitis panel Consider heart catheterization  Marshell Garfinkel MD [facility] 04/02/2020, 11:20 AM  CC: Leamon Arnt, MD

## 2020-04-04 LAB — SCLERODERMA DIAGNOSTIC PROFILE
Anti Nuclear Antibody (ANA): NEGATIVE
Scleroderma (Scl-70) (ENA) Antibody, IgG: <0.2 AI (ref 0.0–0.9)

## 2020-04-04 LAB — PRO B NATRIURETIC PEPTIDE: NT-Pro BNP: 68 pg/mL (ref 0–738)

## 2020-04-04 LAB — SJOGREN'S SYNDROME ANTIBODS(SSA + SSB)
SSA (Ro) (ENA) Antibody, IgG: <1 AI
SSB (La) (ENA) Antibody, IgG: <1 AI

## 2020-04-04 LAB — HIV ANTIBODY (ROUTINE TESTING W REFLEX): HIV 1&2 Ab, 4th Generation: NONREACTIVE

## 2020-04-04 LAB — ANA,IFA RA DIAG PNL W/RFLX TIT/PATN
Anti Nuclear Antibody (ANA): NEGATIVE
Cyclic Citrullin Peptide Ab: 28 UNITS — ABNORMAL HIGH
Rheumatoid fact SerPl-aCnc: <14 IU/mL (ref <14)

## 2020-04-04 LAB — IGE: IgE (Immunoglobulin E), Serum: 26 kU/L (ref <=114)

## 2020-04-13 ENCOUNTER — Other Ambulatory Visit: Payer: Self-pay

## 2020-04-13 ENCOUNTER — Ambulatory Visit: Payer: Medicare Other

## 2020-04-13 DIAGNOSIS — R0602 Shortness of breath: Secondary | ICD-10-CM

## 2020-04-13 DIAGNOSIS — I272 Pulmonary hypertension, unspecified: Secondary | ICD-10-CM

## 2020-04-13 DIAGNOSIS — G4733 Obstructive sleep apnea (adult) (pediatric): Secondary | ICD-10-CM | POA: Diagnosis not present

## 2020-04-21 DIAGNOSIS — G4733 Obstructive sleep apnea (adult) (pediatric): Secondary | ICD-10-CM | POA: Diagnosis not present

## 2020-04-22 DIAGNOSIS — G4733 Obstructive sleep apnea (adult) (pediatric): Secondary | ICD-10-CM

## 2020-04-22 NOTE — Telephone Encounter (Signed)
Dr. Vaughan Browner, please advise on sleep study results.  Thanks!

## 2020-04-23 NOTE — Telephone Encounter (Signed)
Sleep study does show moderate sleep apnea and low O2 levels. Please order in lab titration study to determine the level of CPAP support or oxygen she needs

## 2020-05-01 ENCOUNTER — Encounter: Payer: Self-pay | Admitting: Physician Assistant

## 2020-05-01 ENCOUNTER — Ambulatory Visit (INDEPENDENT_AMBULATORY_CARE_PROVIDER_SITE_OTHER): Payer: Medicare Other | Admitting: Physician Assistant

## 2020-05-01 ENCOUNTER — Other Ambulatory Visit: Payer: Self-pay

## 2020-05-01 DIAGNOSIS — Z1283 Encounter for screening for malignant neoplasm of skin: Secondary | ICD-10-CM | POA: Diagnosis not present

## 2020-05-01 DIAGNOSIS — Z85828 Personal history of other malignant neoplasm of skin: Secondary | ICD-10-CM | POA: Diagnosis not present

## 2020-05-01 DIAGNOSIS — Z86006 Personal history of melanoma in-situ: Secondary | ICD-10-CM | POA: Diagnosis not present

## 2020-05-01 DIAGNOSIS — I2510 Atherosclerotic heart disease of native coronary artery without angina pectoris: Secondary | ICD-10-CM

## 2020-05-01 DIAGNOSIS — L570 Actinic keratosis: Secondary | ICD-10-CM

## 2020-05-01 MED ORDER — TRIAMCINOLONE ACETONIDE 0.1 % EX CREA: 1.0000 "application " | TOPICAL_CREAM | Freq: Every day | CUTANEOUS | 3 refills | Status: DC | PRN

## 2020-05-01 NOTE — Progress Notes (Signed)
   Follow-Up Visit   Subjective  Madison Clements is a 74 y.o. female who presents for the following: new problem (lesion on the left side of the face--redness, elevated but not painful/drainage---1 month. Pt requesting Rx cream prescribe 1 year ago.).   The following portions of the chart were reviewed this encounter and updated as appropriate: Tobacco  Allergies  Meds  Problems  Med Hx  Surg Hx  Fam Hx      Objective  Well appearing patient in no apparent distress; mood and affect are within normal limits.  All skin waist up examined.  Objective  face: No atypical nevi No signs of non-mole skin cancer.   Objective  Right Abdomen (side) - Upper: Scar clear  Objective  Right Ankle - Anterior: Scar clear  Objective  Left Buccal Cheek : Erythematous patches with gritty scale.   Assessment & Plan  Screening exam for skin cancer face  Yearly skin exams  Personal history of melanoma in-situ Right Abdomen (side) - Upper  observe  History of SCC (squamous cell carcinoma) of skin Right Ankle - Anterior  observe  AK (actinic keratosis) Left Buccal Cheek   Destruction of lesion - Left Buccal Cheek   Destruction method: cryotherapy   Informed consent: discussed and consent obtained   Timeout:  patient name, date of birth, surgical site, and procedure verified Lesion destroyed using liquid nitrogen: Yes   Cryotherapy cycles:  1 Outcome: patient tolerated procedure well with no complications   Post-procedure details: wound care instructions given      I, Marco Raper, PA-C, have reviewed all documentation's for this visit.  The documentation on 05/01/20 for the exam, diagnosis, procedures and orders are all accurate and complete.

## 2020-05-04 NOTE — Progress Notes (Signed)
Date:  05/05/2020   ID:  Madison, Clements 1946/02/16, MRN [id number]  PCP:  Leamon Arnt, MD  Cardiologist: Fransico Him, MD Electrophysiologist:  None   Chief Complaint:  HTN/Pulmonary HTN  History of Present Illness:    Madison Clements is a 74 y.o. female  with a history of hyperlipidemia, GERD, hypertension and pulmonary hypertension.  She had an echocardiogram done and 2018 in [place] which showed normal LV function with the EF 55 to 34%, grade 2 diastolic dysfunction with mild to moderate TR and mild pulmonary hypertension with RVSP 39 mmHg.  Repeat 2D echo 1 year later was unchanged.  She has been on Lasix 40 mg daily.  She had a nuclear stress test done 2015 that showed no ischemia.  Cardiac catheterization done in 2016 showed mild CAD with calcified vessels.    She is here today for followup and is doing well.  She continues to have DOE and feels very winded when she walks up an incline which has gotten worse.  She recently saw Dr. Vaughan Browner who recommended RHC.   She recently had a home sleep study done showing moderate OSA with AHI 18/hr and O2 sats as low as 72%.   She denies any chest pain or pressure,  PND, orthopnea, LE edema, dizziness, palpitations or syncope. She is compliant with her meds and is tolerating meds with no SE.    Prior CV studies:   The following studies were reviewed today: EKG  Past Medical History:  Diagnosis Date  . Arthritis   . Atypical mole 02/24/2015   RGHT LATERAL THIGH MODERATE  . Atypical nevi 02/24/2015   RIGHT NECK MILD  . Atypical nevi 08/25/2015   LEFT UPPER ARM MILD  . Atypical nevi 08/25/2015   LEFT LATERAL FOREARM MILD/FREE  . Atypical nevi 02/23/2016   LEFT THIGH MODERATE/FREE  . Atypical nevi 02/23/2016   LEFT MEDIAL LEG MODERATE/FREE  . Atypical nevi 02/23/2016   LEFT UPPER BACK MILD /FREE  . Atypical nevi 08/23/2016   RIGHT ANT PROX THIGH MILD /FREE  . Atypical nevi 07/25/2018   MID LOWER BACK MODERATE   . Atypical nevi 01/22/2019   LEFT MID BACK MILD /FREE  . Atypical nevi 01/22/2019   LEFT NECK MILD  . Atypical nevi 08/06/2019   LEFT INNER BREAST MODERATE W/S  . Atypical nevi 08/06/2019   LEFT OUTER SIDE MILD/FREE  . CAD (coronary artery disease), native coronary artery 02/07/2019   Minimal CAD at the time of cath in 2016 in [facility]  . Gastric polyps 10/02/2018   EGD 03/2015; benign  . GERD (gastroesophageal reflux disease) 10/02/2018  . History of melanoma 10/02/2018   Abdomen 2012; 2013; s/p local excisions.   . Hyperlipidemia   . Hypertension   . Osteopenia   . Polyp of colon, adenomatous 10/02/2018   Colonoscopy 03/2015; recheck in 2021  . Pulmonary hypertension, primary ([place]) 10/02/2018  . SCCA (squamous cell carcinoma) of skin 09/18/2017   RIGHT ANT. DISTAL LOWER LEG TREATED BY DR. Delton Coombes   Past Surgical History:  Procedure Laterality Date  . ABDOMINAL HYSTERECTOMY    . APPENDECTOMY  1962  . BLADDER SUSPENSION    . MENISCUS REPAIR  2001  . thumb surgery       Current Meds  Medication Sig  . celecoxib (CELEBREX) 100 MG capsule Take 1 capsule (100 mg total) by mouth daily.  Marland Kitchen diltiazem (TIAZAC) 180 MG 24 hr capsule Take 1 capsule (180  mg total) by mouth daily.  . folic acid (FOLVITE) 712 MCG tablet Take 400 mcg by mouth daily.  . furosemide (LASIX) 40 MG tablet Take 1 tablet (40 mg total) by mouth daily.  Marland Kitchen lisinopril (ZESTRIL) 20 MG tablet Take 0.5 tablets (10 mg total) by mouth daily.  Marland Kitchen omeprazole (PRILOSEC) 20 MG capsule Take 1 capsule (20 mg total) by mouth daily.  . Probiotic Product (ADVANCED PROBIOTIC PO) Take 1 capsule by mouth daily.  . rosuvastatin (CRESTOR) 40 MG tablet Take 1 tablet (40 mg total) by mouth at bedtime.  Marland Kitchen Specialty Vitamins Products (ONE-A-DAY BONE STRENGTH PO) Take 1 tablet by mouth every other day.  . triamcinolone cream (KENALOG) 0.1 % Apply 1 application topically daily as needed.     Allergies:   Morphine and Morphine  and related   Social History   Tobacco Use  . Smoking status: Former Smoker    Types: Cigarettes    Quit date: 09/12/1996    Years since quitting: 23.6  . Smokeless tobacco: Never Used  Vaping Use  . Vaping Use: Never used  Substance Use Topics  . Alcohol use: Not Currently  . Drug use: Never     Family Hx: The patient's family history includes Arthritis in her mother, paternal grandmother, and sister; Asthma in her maternal grandfather; Cancer in her sister; Diabetes in her sister; Early death in her maternal grandmother; Heart attack in her father; Heart disease in her father and paternal grandmother; High Cholesterol in her sister; High blood pressure in her father, maternal grandmother, mother, paternal grandmother, and sister; Kidney disease in her maternal grandfather and paternal grandfather.  ROS:   Please see the history of present illness.     All other systems reviewed and are negative.   Labs/Other Tests and Data Reviewed:    Recent Labs: 07/16/2019: TSH 1.92 01/14/2020: BUN 21; Creatinine, Ser 1.17; Potassium 4.2; Sodium 138 04/02/2020: ALT 36; Hemoglobin 14.5; NT-Pro BNP 68; Platelets 272.0   Recent Lipid Panel Lab Results  Component Value Date/Time   CHOL 162 01/14/2020 09:04 AM   TRIG 248.0 (H) 01/14/2020 09:04 AM   HDL 55.30 01/14/2020 09:04 AM   CHOLHDL 3 01/14/2020 09:04 AM   LDLCALC 101 07/27/2018 12:00 AM   LDLDIRECT 68.0 01/14/2020 09:04 AM    Wt Readings from Last 3 Encounters:  05/05/20 157 lb 6.4 oz (71.4 kg)  04/02/20 158 lb 12.8 oz (72 kg)  03/30/20 158 lb 9.6 oz (71.9 kg)     Objective:    Vital Signs:  BP (!) 142/68   Pulse 67   Ht 5\' 1"  (1.549 m)   Wt 157 lb 6.4 oz (71.4 kg)   SpO2 96%   BMI 29.74 kg/m    GEN: Well nourished, well developed in no acute distress HEENT: Normal NECK: No JVD; No carotid bruits LYMPHATICS: No lymphadenopathy CARDIAC:RRR, no murmurs, rubs, gallops RESPIRATORY:  Clear to auscultation without rales,  wheezing or rhonchi  ABDOMEN: Soft, non-tender, non-distended MUSCULOSKELETAL:  No edema; No deformity  SKIN: Warm and dry NEUROLOGIC:  Alert and oriented x 3 PSYCHIATRIC:  Normal affect   EKG was performed today ad showed NSR at 67bpm with RBBB  ASSESSMENT & PLAN:    1.  Pulmonary hypertension  -moderate by echo 03/2020 with PASP 26mmHg   -This is likely a combination of WHO group 2 pulmonary venous hypertension from diastolic dysfunction and Group 3 from OSA. -sleep study showed moderate OSA with AHI 18/hr and O2 sats as  low as 72% -continue Lasix 40mg  daily -continue CPAP therapy -SCr 1.17 in May 2021 -she was seen by Pulmonary and Dr. Vaughan Browner recommended RHC due to increasing DOE but this was prior to getting the results of the sleep study which showed moderate OSA with hypoxia -recommend starting CPAP therapy and then repeat echo to see if pulmonary HTN decreases as her PASP has been stable for 2 years.   2.  Minimal CAD  -this was done in 2016 in [facility].   -Nuclear stress test 03/2020 for coronary artery calcifications noted on chest CT year was normal.   -she denies any anginal chest pain -continue ASA 81 mg daily and Crestor 40mg  daily  3.  Hyperlipidemia  -LDL goal < 70 -LDL was 68 in May 2021 -continue Lipitor 40mg  daily  4.  Hypertension  -BP controlled on exam -continue Cardizem CD 180mg  daily and Lisinopril 10mg  daily -SCr 1.17 in May and K+ 4.2   Medication Adjustments/Labs and Tests Ordered: Current medicines are reviewed at length with the patient today.  Concerns regarding medicines are outlined above.  Tests Ordered: Orders Placed This Encounter  Procedures  . EKG 12-Lead   Medication Changes: No orders of the defined types were placed in this encounter.   Disposition:  Follow up in 1 year(s)  Signed, Fransico Him, MD  05/05/2020 9:47 AM    [facility]

## 2020-05-05 ENCOUNTER — Encounter: Payer: Self-pay | Admitting: Cardiology

## 2020-05-05 ENCOUNTER — Ambulatory Visit (INDEPENDENT_AMBULATORY_CARE_PROVIDER_SITE_OTHER): Payer: Medicare Other | Admitting: Cardiology

## 2020-05-05 ENCOUNTER — Other Ambulatory Visit: Payer: Self-pay

## 2020-05-05 VITALS — BP 142/68 | HR 67 | Ht 61.0 in | Wt 157.4 lb

## 2020-05-05 DIAGNOSIS — I2510 Atherosclerotic heart disease of native coronary artery without angina pectoris: Secondary | ICD-10-CM

## 2020-05-05 DIAGNOSIS — E782 Mixed hyperlipidemia: Secondary | ICD-10-CM | POA: Diagnosis not present

## 2020-05-05 DIAGNOSIS — I10 Essential (primary) hypertension: Secondary | ICD-10-CM | POA: Diagnosis not present

## 2020-05-05 DIAGNOSIS — I270 Primary pulmonary hypertension: Secondary | ICD-10-CM | POA: Diagnosis not present

## 2020-05-05 NOTE — Addendum Note (Signed)
Addended by: Antonieta Iba on: 05/05/2020 10:06 AM   Modules accepted: Orders

## 2020-05-05 NOTE — Patient Instructions (Signed)
Medication Instructions:  Your physician recommends that you continue on your current medications as directed. Please refer to the Current Medication list given to you today.  *If you need a refill on your cardiac medications before your next appointment, please call your pharmacy*  Follow-Up: At [facility], you and your health needs are our priority.  As part of our continuing mission to provide you with exceptional heart care, we have created designated Provider Care Teams.  These Care Teams include your primary Cardiologist (physician) and Advanced Practice Providers (APPs -  Physician Assistants and Nurse Practitioners) who all work together to provide you with the care you need, when you need it.  Your next appointment:   1 year(s)  The format for your next appointment:   In Person  Provider:   You may see Traci Turner, MD or one of the following Advanced Practice Providers on your designated Care Team:   Dayna Dunn, PA-C Michele Lenze, PA-C   Other Instructions You have been referred to see our PharmD in the Lipid Clinic 

## 2020-05-07 ENCOUNTER — Telehealth: Payer: Self-pay | Admitting: *Deleted

## 2020-05-07 DIAGNOSIS — G4733 Obstructive sleep apnea (adult) (pediatric): Secondary | ICD-10-CM

## 2020-05-07 NOTE — Telephone Encounter (Signed)
-----   Message from Sueanne Margarita, MD sent at 05/06/2020  9:42 AM EDT ----- Please order ResMed CPAP on auto from 4-18cm H2O with heated humidity and mask of choice and followup with me in 8 weeks  Traci ----- Message ----- From: Marshell Garfinkel, MD Sent: 05/06/2020   7:43 AM EDT To: Sueanne Margarita, MD  I think we can treat OSA and then repeat echo. Will be ordering the CPAP? Thanks  ----- Message ----- From: Sueanne Margarita, MD Sent: 05/05/2020  10:01 AM EDT To: Marshell Garfinkel, MD  Reather Converse has moderate OSA on home sleep study with hypoxia which likely is contributing to her PHTN.  Do you still want right heart cath or do you want to treat OSA and repeat echo in 6 months  Traci

## 2020-05-11 ENCOUNTER — Telehealth: Payer: Self-pay | Admitting: Pulmonary Disease

## 2020-05-11 NOTE — Telephone Encounter (Signed)
Patient called to say Dr Vaughan Browner is handling her sleep therapy and her echo and she did not want me to order her the auto pap.

## 2020-05-11 NOTE — Telephone Encounter (Signed)
error 

## 2020-05-13 ENCOUNTER — Ambulatory Visit (INDEPENDENT_AMBULATORY_CARE_PROVIDER_SITE_OTHER): Payer: Medicare Other | Admitting: Internal Medicine

## 2020-05-13 ENCOUNTER — Encounter: Payer: Self-pay | Admitting: Internal Medicine

## 2020-05-13 ENCOUNTER — Other Ambulatory Visit (INDEPENDENT_AMBULATORY_CARE_PROVIDER_SITE_OTHER): Payer: Medicare Other

## 2020-05-13 VITALS — BP 128/60 | HR 65 | Ht 61.0 in | Wt 159.0 lb

## 2020-05-13 DIAGNOSIS — Z860100 Personal history of colon polyps, unspecified: Secondary | ICD-10-CM

## 2020-05-13 DIAGNOSIS — R7989 Other specified abnormal findings of blood chemistry: Secondary | ICD-10-CM

## 2020-05-13 DIAGNOSIS — K219 Gastro-esophageal reflux disease without esophagitis: Secondary | ICD-10-CM

## 2020-05-13 DIAGNOSIS — R945 Abnormal results of liver function studies: Secondary | ICD-10-CM | POA: Diagnosis not present

## 2020-05-13 DIAGNOSIS — I2510 Atherosclerotic heart disease of native coronary artery without angina pectoris: Secondary | ICD-10-CM

## 2020-05-13 DIAGNOSIS — R1310 Dysphagia, unspecified: Secondary | ICD-10-CM

## 2020-05-13 LAB — IBC PANEL
Iron: 96 ug/dL (ref 42–145)
Saturation Ratios: 24.1 % (ref 20.0–50.0)
Transferrin: 284 mg/dL (ref 212.0–360.0)

## 2020-05-13 LAB — PROTIME-INR
INR: 0.9 ratio (ref 0.8–1.0)
Prothrombin Time: 10.6 s (ref 9.6–13.1)

## 2020-05-13 LAB — FERRITIN: Ferritin: 89.3 ng/mL (ref 10.0–291.0)

## 2020-05-13 LAB — IRON: Iron: 96 ug/dL (ref 42–145)

## 2020-05-13 NOTE — Patient Instructions (Signed)
Your provider has requested that you go to the basement level for lab work before leaving today. Press "B" on the elevator. The lab is located at the first door on the left as you exit the elevator.  You have been scheduled for an abdominal ultrasound at [facility] (1st floor of hospital) on 05/21/2020 at 10:30am. Please arrive 15 minutes prior to your appointment for registration. Make certain not to have anything to eat or drink 6 hours prior to your appointment. Should you need to reschedule your appointment, please contact radiology at [phone]. This test typically takes about 30 minutes to perform.  I will call you back to schedule your procedures

## 2020-05-13 NOTE — Progress Notes (Signed)
HISTORY OF PRESENT ILLNESS:  Madison Clements is a pleasant 74 y.o. female, referred by her primary care provider Dr. Jonni Sanger as well as her sister and brother-in-law (patients of mine) to establish GI care.  Most recent GI care provided in [facility].  Patient had been residing with her husband at [facility] in [place] until his death.  Reviewing outside records finds both colonoscopy and upper endoscopy performed March 26, 2015.  Colonoscopy revealed tubular adenomas of both the cecum and rectum.  Exam was otherwise normal except for hemorrhoids.  Upper endoscopy revealed gastric polyps gastritis unremarkable gastric biopsies.  Patient received a recall letter for surveillance colonoscopy.  She presents today regarding surveillance colonoscopy.  Also, to be evaluated regarding chronic GERD and complaints of dysphagia.  Finally, recently noted to have chronic mild elevation of liver tests by her pulmonologist.  Previously not known to have such.  First, she has no significant lower GI complaints.  On rare occasion she will notice minor amounts of blood on the tissue post defecation.  She attributes this to hemorrhoids.  Next, she does require omeprazole 20 mg daily to control GERD symptoms.  Significant symptoms off medication.  She reports occasional but severe intermittent solid food dysphagia items such as chicken.  Recently had to have the "Heimlich maneuver" performed.  Her weight has been stable.  Finally, reviewing outside laboratories from November 2020, May 2021 and July 2021 show mildly elevated hepatic transaminases.  AST range between 30 and 38.  ALT ranged between 36 and 44.  Total protein and albumin are normal.  Alkaline phosphatase and bilirubin are normal.  CBC shows normal hemoglobin of 14.5 with normal MCV of 92.4.  Normal platelets of 272,000.  Patient does consume approximately 2 alcoholic beverages per night.  No family history of liver disease.  She is on no new  medications.  Her BMI is 30.  No issues with swelling or edema.  No relevant x-rays performed.  She has completed her Covid vaccination series  REVIEW OF SYSTEMS:  All non-GI ROS negative unless otherwise stated in the HPI except for arthritis  Past Medical History:  Diagnosis Date  . Arthritis   . Atypical mole 02/24/2015   RGHT LATERAL THIGH MODERATE  . Atypical nevi 02/24/2015   RIGHT NECK MILD  . Atypical nevi 08/25/2015   LEFT UPPER ARM MILD  . Atypical nevi 08/25/2015   LEFT LATERAL FOREARM MILD/FREE  . Atypical nevi 02/23/2016   LEFT THIGH MODERATE/FREE  . Atypical nevi 02/23/2016   LEFT MEDIAL LEG MODERATE/FREE  . Atypical nevi 02/23/2016   LEFT UPPER BACK MILD /FREE  . Atypical nevi 08/23/2016   RIGHT ANT PROX THIGH MILD /FREE  . Atypical nevi 07/25/2018   MID LOWER BACK MODERATE  . Atypical nevi 01/22/2019   LEFT MID BACK MILD /FREE  . Atypical nevi 01/22/2019   LEFT NECK MILD  . Atypical nevi 08/06/2019   LEFT INNER BREAST MODERATE W/S  . Atypical nevi 08/06/2019   LEFT OUTER SIDE MILD/FREE  . CAD (coronary artery disease), native coronary artery 02/07/2019   Minimal CAD at the time of cath in 2016 in [facility]  . Gastric polyps 10/02/2018   EGD 03/2015; benign  . GERD (gastroesophageal reflux disease) 10/02/2018  . History of melanoma 10/02/2018   Abdomen 2012; 2013; s/p local excisions.   . Hyperlipidemia   . Hypertension   . Osteopenia   . Polyp of colon, adenomatous 10/02/2018  Colonoscopy 03/2015; recheck in 2021  . Pulmonary hypertension, primary ([place]) 10/02/2018  . SCCA (squamous cell carcinoma) of skin 09/18/2017   RIGHT ANT. DISTAL LOWER LEG TREATED BY DR. Delton Coombes  . Sleep apnea     Past Surgical History:  Procedure Laterality Date  . ABDOMINAL HYSTERECTOMY    . APPENDECTOMY  1962  . BLADDER SUSPENSION    . MENISCUS REPAIR  2001  . thumb surgery      Social History Madison Clements  reports that she quit smoking about 23  years ago. Her smoking use included cigarettes. She has never used smokeless tobacco. She reports current alcohol use. She reports that she does not use drugs.  family history includes Arthritis in her mother, paternal grandmother, and sister; Asthma in her maternal grandfather; Breast cancer in her sister; Diabetes in her sister; Early death in her maternal grandmother; Heart attack in her father; Heart disease in her father and paternal grandmother; High Cholesterol in her sister; High blood pressure in her father, maternal grandmother, mother, paternal grandmother, and sister; Kidney disease in her maternal grandfather and paternal grandfather.  Allergies  Allergen Reactions  . Morphine Rash  . Morphine And Related Nausea And Vomiting and Rash       PHYSICAL EXAMINATION: Vital signs: BP 128/60   Pulse 65   Ht 5\' 1"  (1.549 m)   Wt 159 lb (72.1 kg)   SpO2 97%   BMI 30.04 kg/m   Constitutional: generally well-appearing, no acute distress Psychiatric: alert and oriented x3, cooperative Eyes: extraocular movements intact, anicteric, conjunctiva pink Mouth: oral pharynx moist, no lesions Neck: supple no lymphadenopathy Cardiovascular: heart regular rate and rhythm, no murmur Lungs: clear to auscultation bilaterally Abdomen: soft, nontender, nondistended, no obvious ascites, no peritoneal signs, normal bowel sounds, no organomegaly Rectal: Deferred to colonoscopy Extremities: no clubbing, cyanosis, or lower extremity edema bilaterally Skin: no lesions on visible extremities Neuro: No focal deficits.  Cranial nerves intact.  No asterixis  ASSESSMENT:  1.  Chronic GERD.  Requiring PPI to control symptoms. 2.  Intermittent solid food dysphagia.  Suspect peptic stricture or ring. 3.  History of adenomatous colon polyps.  Due for surveillance.  Last examination July 2016 in [place] 4.  Mild chronic elevation of hepatic transaminases.  Regular alcohol use.  Suspect fatty  liver   PLAN:  1.  Reflux precautions 2.  Continue omeprazole 20 mg daily. 3.  Schedule upper endoscopy with probable esophageal dilation.The nature of the procedure, as well as the risks, benefits, and alternatives were carefully and thoroughly reviewed with the patient. Ample time for discussion and questions allowed. The patient understood, was satisfied, and agreed to proceed. 4.  Schedule surveillance colonoscopy with polypectomy if indicated.The nature of the procedure, as well as the risks, benefits, and alternatives were carefully and thoroughly reviewed with the patient. Ample time for discussion and questions allowed. The patient understood, was satisfied, and agreed to proceed. 5.  Blood work today to assess for viral and nonviral causes for chronically elevated hepatic transaminases 6.  Hepatic ultrasound to evaluate the hepatic parenchyma 7.  GI follow-up after the above A total time of 60 minutes was spent preparing to see the patient, reviewing outside tests, procedures, pathology.  Obtaining comprehensive history and performing comprehensive physical examination.  Counseling the patient regarding her multiple above listed issues.  Reviewing medical therapy and its risks.  Ordering advanced blood work, advanced radiology, and therapeutic endoscopic procedures.  Finally, documenting clinical information in the health  record

## 2020-05-16 LAB — HEPATITIS B SURFACE ANTIBODY,QUALITATIVE: Hep B S Ab: NONREACTIVE

## 2020-05-16 LAB — HEPATITIS C ANTIBODY
Hepatitis C Ab: NONREACTIVE
SIGNAL TO CUT-OFF: 0 (ref <1.00)

## 2020-05-16 LAB — ANTI-SMOOTH MUSCLE ANTIBODY, IGG: Actin (Smooth Muscle) Antibody (IGG): <20 U (ref <20)

## 2020-05-16 LAB — HEPATITIS B SURFACE ANTIGEN: Hepatitis B Surface Ag: NONREACTIVE

## 2020-05-16 LAB — ANA: Anti Nuclear Antibody (ANA): NEGATIVE

## 2020-05-16 LAB — TISSUE TRANSGLUTAMINASE, IGA: (tTG) Ab, IgA: 1 U/mL

## 2020-05-16 LAB — ALPHA-1-ANTITRYPSIN: A-1 Antitrypsin, Ser: 150 mg/dL (ref 83–199)

## 2020-05-16 LAB — MITOCHONDRIAL ANTIBODIES: Mitochondrial M2 Ab, IgG: <=20 U

## 2020-05-16 LAB — CERULOPLASMIN: Ceruloplasmin: 31 mg/dL (ref 18–53)

## 2020-05-17 ENCOUNTER — Encounter (HOSPITAL_BASED_OUTPATIENT_CLINIC_OR_DEPARTMENT_OTHER): Payer: Medicare Other | Admitting: Pulmonary Disease

## 2020-05-21 ENCOUNTER — Other Ambulatory Visit: Payer: Self-pay

## 2020-05-21 ENCOUNTER — Ambulatory Visit (HOSPITAL_COMMUNITY)
Admission: RE | Admit: 2020-05-21 | Discharge: 2020-05-21 | Disposition: A | Discharge status: Home / Self Care | Payer: Medicare Other | Source: Ambulatory Visit | Attending: Internal Medicine | Admitting: Internal Medicine

## 2020-05-21 DIAGNOSIS — K760 Fatty (change of) liver, not elsewhere classified: Secondary | ICD-10-CM

## 2020-05-21 DIAGNOSIS — Z86010 Personal history of colonic polyps: Secondary | ICD-10-CM | POA: Insufficient documentation

## 2020-05-21 DIAGNOSIS — R7989 Other specified abnormal findings of blood chemistry: Secondary | ICD-10-CM | POA: Insufficient documentation

## 2020-05-21 DIAGNOSIS — R1310 Dysphagia, unspecified: Secondary | ICD-10-CM | POA: Diagnosis not present

## 2020-05-21 DIAGNOSIS — K219 Gastro-esophageal reflux disease without esophagitis: Secondary | ICD-10-CM | POA: Diagnosis not present

## 2020-05-21 HISTORY — DX: Fatty (change of) liver, not elsewhere classified: K76.0

## 2020-05-26 ENCOUNTER — Other Ambulatory Visit: Payer: Self-pay

## 2020-05-26 ENCOUNTER — Ambulatory Visit (HOSPITAL_BASED_OUTPATIENT_CLINIC_OR_DEPARTMENT_OTHER): Payer: Medicare Other | Attending: Pulmonary Disease | Admitting: Pulmonary Disease

## 2020-05-26 DIAGNOSIS — G4730 Sleep apnea, unspecified: Secondary | ICD-10-CM

## 2020-05-26 DIAGNOSIS — G4733 Obstructive sleep apnea (adult) (pediatric): Secondary | ICD-10-CM | POA: Insufficient documentation

## 2020-05-26 HISTORY — DX: Sleep apnea, unspecified: G47.30

## 2020-05-26 NOTE — Progress Notes (Signed)
Patient ID: Madison Clements                 DOB: April 02, 1946                    MRN: [id number]     HPI: Madison Clements is a 74 y.o. female patient referred to lipid clinic by Dr. Radford Pax. PMH is significant for HLD, HTN, pulmonary hypertension, sleep apnea, GERD.  Patient presents to clinic today for follow-up on triglyceride levels, which have been consistently in mid-200s since June 2020. Patient was previously prescribed Vascepa but was not able to afford it, as the copay was around $700. She was then told to try an OTC fish oil, but experienced an adverse reaction on her arms. Dr. Radford Pax advised her to stop the OTC fish oil and her PCP took her off aspirin.  Patient has made a conscious effort starting Thursday of last week to eat healthier, start going to the gym, and cut out alcohol. Patient reports having a generally healthy diet, but that she was drinking more alcohol than she probably thought after her husband passed away about 2 years ago. Reports having an average of 3 drinks per day in the past. Since last Thursday, she has not had any alcohol.  Current Medications: Rosuvastatin 40 mg daily (at bedtime) Intolerances: OTC Fish oil Risk Factors: HLD, HTN LDL goal: <70 mg/dL  Diet: Eats pretty healthy, cooks her own food (uses olive oil), no fast food, no fried food. No longer eats potatoes or white rice; moving to oatmeal and whole wheat pasta/brown rice instead. Eats cereal ([employer] flax seed with a touch of honey) with fruit in mornings. Eats salad for lunch.  Drinks water, may add lemon juice or fresh lemon. Drinks flavored seltzer. Drinks fat-free milk and coffee with half&half (does not like fat-free half&half) Does not ever drink sodas.  Exercise: Starting to go to the gym  Family History: Heart disease (father, paternal grandmother); high cholesterol (sister); heart attack (father); high blood pressure (father, maternal grandmother, mother, paternal grandmother,  sister); kidney disease (maternal grandfather, paternal grandfather); diabetes (sister)  Social History: Former smoker (quit 1998). Previously ~3 alcoholic drinks per day, but has recently chosen to stop drinking.  Labs: Lipid panel (01/14/20): LDL 68; HDL 55; TG 248; TC 162 [on rosuvastatin 40 mg]  Past Medical History:  Diagnosis Date  . Arthritis   . Atypical mole 02/24/2015   RGHT LATERAL THIGH MODERATE  . Atypical nevi 02/24/2015   RIGHT NECK MILD  . Atypical nevi 08/25/2015   LEFT UPPER ARM MILD  . Atypical nevi 08/25/2015   LEFT LATERAL FOREARM MILD/FREE  . Atypical nevi 02/23/2016   LEFT THIGH MODERATE/FREE  . Atypical nevi 02/23/2016   LEFT MEDIAL LEG MODERATE/FREE  . Atypical nevi 02/23/2016   LEFT UPPER BACK MILD /FREE  . Atypical nevi 08/23/2016   RIGHT ANT PROX THIGH MILD /FREE  . Atypical nevi 07/25/2018   MID LOWER BACK MODERATE  . Atypical nevi 01/22/2019   LEFT MID BACK MILD /FREE  . Atypical nevi 01/22/2019   LEFT NECK MILD  . Atypical nevi 08/06/2019   LEFT INNER BREAST MODERATE W/S  . Atypical nevi 08/06/2019   LEFT OUTER SIDE MILD/FREE  . CAD (coronary artery disease), native coronary artery 02/07/2019   Minimal CAD at the time of cath in 2016 in [facility]  . Gastric polyps 10/02/2018   EGD 03/2015; benign  . GERD (gastroesophageal reflux disease)  10/02/2018  . History of melanoma 10/02/2018   Abdomen 2012; 2013; s/p local excisions.   . Hyperlipidemia   . Hypertension   . Osteopenia   . Polyp of colon, adenomatous 10/02/2018   Colonoscopy 03/2015; recheck in 2021  . Pulmonary hypertension, primary ([place]) 10/02/2018  . SCCA (squamous cell carcinoma) of skin 09/18/2017   RIGHT ANT. DISTAL LOWER LEG TREATED BY DR. Delton Coombes  . Sleep apnea     Current Outpatient Medications on File Prior to Visit  Medication Sig Dispense Refill  . celecoxib (CELEBREX) 100 MG capsule Take 1 capsule (100 mg total) by mouth daily. 90 capsule 3  . diltiazem  (TIAZAC) 180 MG 24 hr capsule Take 1 capsule (180 mg total) by mouth daily. 90 capsule 3  . folic acid (FOLVITE) 235 MCG tablet Take 400 mcg by mouth daily.    . furosemide (LASIX) 40 MG tablet Take 1 tablet (40 mg total) by mouth daily. 90 tablet 3  . lisinopril (ZESTRIL) 20 MG tablet Take 0.5 tablets (10 mg total) by mouth daily. 90 tablet 3  . omeprazole (PRILOSEC) 20 MG capsule Take 1 capsule (20 mg total) by mouth daily. 90 capsule 3  . Probiotic Product (ADVANCED PROBIOTIC PO) Take 1 capsule by mouth daily.    . rosuvastatin (CRESTOR) 40 MG tablet Take 1 tablet (40 mg total) by mouth at bedtime. 90 tablet 3  . Specialty Vitamins Products (ONE-A-DAY BONE STRENGTH PO) Take 1 tablet by mouth every other day.    . triamcinolone cream (KENALOG) 0.1 % Apply 1 application topically daily as needed. 453 g 3   No current facility-administered medications on file prior to visit.    Allergies  Allergen Reactions  . Morphine Rash  . Morphine And Related Nausea And Vomiting and Rash    Assessment/Plan:  1. Hypertriglyceridemia - Patient's TG level is elevated above 150 mg/dL likely due to patient's alcohol consumption.  In addition to lifestyle modifications, we will start fenofibrate 48 mg once daily to see how TG's respond. Discussed diet extensively, including eating fresh vegetables/fruits and lean meats. Counseled patient that reducing sugar/carbs, cutting out alcohol, and choosing unsaturated fats over saturated fats can help with lowering TG levels. Patient is motivated and willing to continue these lifestyle modifications. Patient will get her lipid panel checked in about 2 months (Nov) with her PCP.   Esmeralda Links (PharmD Candidate 2022) [place]  [address] [place], [place] [address]  Phone: [phone]; Fax: [phone]   Karren Cobble, PharmD, BCACP, [place] [address] [place], [place] [address] Phone: [phone]; Fax: [phone] 05/27/2020 5:16 PM

## 2020-05-27 ENCOUNTER — Ambulatory Visit (INDEPENDENT_AMBULATORY_CARE_PROVIDER_SITE_OTHER): Payer: Medicare Other | Admitting: Pharmacist

## 2020-05-27 ENCOUNTER — Ambulatory Visit: Payer: Medicare Other

## 2020-05-27 DIAGNOSIS — I2510 Atherosclerotic heart disease of native coronary artery without angina pectoris: Secondary | ICD-10-CM | POA: Diagnosis not present

## 2020-05-27 DIAGNOSIS — E782 Mixed hyperlipidemia: Secondary | ICD-10-CM

## 2020-05-27 MED ORDER — FENOFIBRATE 48 MG PO TABS: 48.0000 mg | ORAL_TABLET | Freq: Every day | ORAL | 0 refills | Status: DC

## 2020-05-27 NOTE — Patient Instructions (Addendum)
Great to see you today!  START fenofibrate 48 mg once daily. You can take it at the same time as your Crestor.  CONTINUE eating a heart-healthy diet: fresh vegetables/fruits, lean meats (chicken, fish), whole wheat. Avoid sugar/carbs and alcohol.  CONTINUE exercising and increasing your physical activity. It is recommended to exercise 30 min daily for at least 5 days a week.   Please call us if you have any questions or concerns: [phone]

## 2020-05-28 DIAGNOSIS — G4733 Obstructive sleep apnea (adult) (pediatric): Secondary | ICD-10-CM | POA: Diagnosis not present

## 2020-05-28 NOTE — Procedures (Signed)
    Patient Name: Madison Clements, Madison Clements Date: 05/26/2020 Gender: Female D.O.B: [birth year] Age (years): 21 Referring Provider: Marshell Garfinkel Height (inches): 61 Interpreting Physician: Chesley Mires MD, ABSM Weight (lbs): 158 RPSGT: Laren Everts BMI: 30 MRN: [id number] Neck Size: 14.25  CLINICAL INFORMATION The patient is referred for a CPAP titration to treat sleep apnea.  Date of HST 04/13/20: AHI 17.9, SpO2 low 72%.  SLEEP STUDY TECHNIQUE As per the AASM Manual for the Scoring of Sleep and Associated Events v2.3 (April 2016) with a hypopnea requiring 4% desaturations.  The channels recorded and monitored were frontal, central and occipital EEG, electrooculogram (EOG), submentalis EMG (chin), nasal and oral airflow, thoracic and abdominal wall motion, anterior tibialis EMG, snore microphone, electrocardiogram, and pulse oximetry. Continuous positive airway pressure (CPAP) was initiated at the beginning of the study and titrated to treat sleep-disordered breathing.  MEDICATIONS Medications self-administered by patient taken the night of the study : BENADRYL  TECHNICIAN COMMENTS Comments added by technician: NONE Comments added by scorer: N/A  RESPIRATORY PARAMETERS Optimal PAP Pressure (cm): 9 AHI at Optimal Pressure (/hr): 2.9 Overall Minimal O2 (%): 86.0 Supine % at Optimal Pressure (%): 36 Minimal O2 at Optimal Pressure (%): 90.0   SLEEP ARCHITECTURE The study was initiated at 10:31:48 PM and ended at 5:30:02 AM.  Sleep onset time was 25.6 minutes and the sleep efficiency was 77.8%%. The total sleep time was 325.5 minutes.  The patient spent 13.8%% of the night in stage N1 sleep, 78.8%% in stage N2 sleep, 0.0%% in stage N3 and 7.4% in REM.Stage REM latency was 267.5 minutes  Wake after sleep onset was 67.1. Alpha intrusion was absent. Supine sleep was 20.60%.  CARDIAC DATA The 2 lead EKG demonstrated sinus rhythm. The mean heart rate was 53.1 beats per minute.  Other EKG findings include: None.  LEG MOVEMENT DATA The total Periodic Limb Movements of Sleep (PLMS) were 0. The PLMS index was 0.0. A PLMS index of <15 is considered normal in adults.  IMPRESSIONS - She did well with CPAP pressure was 9 cm of water. - She did not require use of supplemental oxygen during this study.  DIAGNOSIS - Obstructive Sleep Apnea (G47.33)  RECOMMENDATIONS - Trial of CPAP therapy on 9 cm H2O with a Small size Resmed Full Face Mask AirFit F20 mask and heated humidification.  [Electronically signed] 05/28/2020 02:12 PM  Chesley Mires MD, [place], American Board of Sleep Medicine   NPI: [id number]

## 2020-06-04 ENCOUNTER — Other Ambulatory Visit: Payer: Self-pay

## 2020-06-04 ENCOUNTER — Encounter: Payer: Self-pay | Admitting: Pulmonary Disease

## 2020-06-04 ENCOUNTER — Ambulatory Visit (INDEPENDENT_AMBULATORY_CARE_PROVIDER_SITE_OTHER): Payer: Medicare Other | Admitting: Pulmonary Disease

## 2020-06-04 VITALS — BP 122/60 | HR 69 | Temp 96.9°F | Ht 61.0 in | Wt 152.4 lb

## 2020-06-04 DIAGNOSIS — I272 Pulmonary hypertension, unspecified: Secondary | ICD-10-CM

## 2020-06-04 DIAGNOSIS — Z23 Encounter for immunization: Secondary | ICD-10-CM | POA: Diagnosis not present

## 2020-06-04 DIAGNOSIS — G4733 Obstructive sleep apnea (adult) (pediatric): Secondary | ICD-10-CM | POA: Diagnosis not present

## 2020-06-04 DIAGNOSIS — I2510 Atherosclerotic heart disease of native coronary artery without angina pectoris: Secondary | ICD-10-CM | POA: Diagnosis not present

## 2020-06-04 DIAGNOSIS — R0602 Shortness of breath: Secondary | ICD-10-CM

## 2020-06-04 LAB — PULMONARY FUNCTION TEST
DL/VA % pred: 78 %
DL/VA: 3.28 ml/min/mmHg/L
DLCO cor % pred: 84 %
DLCO cor: 14.57 ml/min/mmHg
DLCO unc % pred: 84 %
DLCO unc: 14.57 ml/min/mmHg
FEF 25-75 Post: 2.5 L/sec
FEF 25-75 Pre: 2.02 L/sec
FEF2575-%Change-Post: 23 %
FEF2575-%Pred-Post: 173 %
FEF2575-%Pred-Pre: 140 %
FEV1-%Change-Post: 4 %
FEV1-%Pred-Post: 120 %
FEV1-%Pred-Pre: 115 %
FEV1-Post: 2.17 L
FEV1-Pre: 2.07 L
FEV1FVC-%Change-Post: 7 %
FEV1FVC-%Pred-Pre: 107 %
FEV6-%Change-Post: -2 %
FEV6-%Pred-Post: 109 %
FEV6-%Pred-Pre: 112 %
FEV6-Post: 2.51 L
FEV6-Pre: 2.57 L
FEV6FVC-%Pred-Post: 105 %
FEV6FVC-%Pred-Pre: 105 %
FVC-%Change-Post: -2 %
FVC-%Pred-Post: 104 %
FVC-%Pred-Pre: 106 %
FVC-Post: 2.51 L
FVC-Pre: 2.58 L
Post FEV1/FVC ratio: 86 %
Post FEV6/FVC ratio: 100 %
Pre FEV1/FVC ratio: 80 %
Pre FEV6/FVC Ratio: 100 %
RV % pred: 116 %
RV: 2.51 L
TLC % pred: 111 %
TLC: 5.14 L

## 2020-06-04 NOTE — Progress Notes (Signed)
Madison Clements    [id number]    1946-01-06  Primary Care Physician:Andy, Karie Fetch, MD  Referring Physician: Leamon Arnt, [place] [address] [place],  [place] [address]  Chief complaint: Consult for pulmonary hypertension  HPI: 74 year old with history of pulmonary hypertension, hyperlipidemia, skin cancer Referred for evaluation of pulmonary hypertension  This was initially diagnosed in 2010 in [place].  Previous echocardiogram with RVSP in the 30-40 range.  She was evaluated by pulmonologist in 2014 with normal PFTs and was told that she had mild OSA on sleep study that did not require treatment. More recently she has been having increasing dyspnea on exertion, lower extremity edema.  She has osteoarthritis.  Denies morning stiffness, difficulty swallowing, Raynaud's syndrome.  Recent echocardiogram with elevation in RVSP and has been referred to pulmonary for further evaluation.  She is also being evaluated for petechial rash by primary care  She also follows with Dr. Radford Pax from cardiology and is going to establish with Dr. Henrene Pastor from GI  Pets: No pets Occupation: Retired Arts development officer for [employer] Exposures: No known exposures.  No mold, hot tub, Jacuzzi.  No feather pillows or comforters Smoking history: 5-pack-year smoker.  Quit in 1998 Travel history: Previously lived in [place], [place].  No significant recent travel Relevant family history: No significant family history of lung disease  Interim history: Continues to do well with regard to breathing Here for review of sleep study results which showed mild OSA Plan she has followed up with Dr. Henrene Pastor, GI with work-up for elevated transaminases with ultrasound showing hepatic steatosis Work-up for alpha-1 antitrypsin, hepatitis, autoimmune hepatitis is negative.  Outpatient Encounter Medications as of 06/04/2020  Medication Sig   celecoxib (CELEBREX) 100 MG capsule Take 1 capsule (100 mg  total) by mouth daily.   diltiazem (TIAZAC) 180 MG 24 hr capsule Take 1 capsule (180 mg total) by mouth daily.   fenofibrate (TRICOR) 48 MG tablet Take 1 tablet (48 mg total) by mouth daily.   folic acid (FOLVITE) 194 MCG tablet Take 400 mcg by mouth daily.   furosemide (LASIX) 40 MG tablet Take 1 tablet (40 mg total) by mouth daily.   lisinopril (ZESTRIL) 20 MG tablet Take 0.5 tablets (10 mg total) by mouth daily.   omeprazole (PRILOSEC) 20 MG capsule Take 1 capsule (20 mg total) by mouth daily.   Probiotic Product (ADVANCED PROBIOTIC PO) Take 1 capsule by mouth daily.   rosuvastatin (CRESTOR) 40 MG tablet Take 1 tablet (40 mg total) by mouth at bedtime.   Specialty Vitamins Products (ONE-A-DAY BONE STRENGTH PO) Take 1 tablet by mouth every other day.   triamcinolone cream (KENALOG) 0.1 % Apply 1 application topically daily as needed.   No facility-administered encounter medications on file as of 06/04/2020.    Physical Exam: Blood pressure 122/60, pulse 69, temperature (!) 96.9 F (36.1 C), temperature source Oral, height 5\' 1"  (1.549 m), weight 152 lb 6.4 oz (69.1 kg), SpO2 96 %. Gen:      No acute distress HEENT:  EOMI, sclera anicteric Neck:     No masses; no thyromegaly Lungs:    Clear to auscultation bilaterally; normal respiratory effort CV:         Regular rate and rhythm; no murmurs Abd:      + bowel sounds; soft, non-tender; no palpable masses, no distension Ext:    No edema; adequate peripheral perfusion Skin:      Warm and dry;  no rash Neuro: alert and oriented x 3 Psych: normal mood and affect  Data Reviewed: Imaging: Chest x-ray 04/02/2020-normal.  I reviewed the images personally.  PFTs: 06/04/2020 FVC 2.58 [106%], FEV1 2.07 [115%], F/F 80, TLC 5.14 [111%], DLCO 14.57 [84%] Normal test  Labs: CMP from 01/14/2020-significant for slightly elevated transaminases and total bilirubin. 04/02/2020-CCP 28, negative Ro, La, SCL 70 and ANA, negative  HIV  Cardiac: Echocardiogram 03/17/2020-LVEF 60 to 91%, grade 1 diastolic dysfunction, moderately elevated RVSP of 46.2  Sleep: Home PSG 04/13/2020-moderate sleep apnea, AHI 18 with desats CPAP titration 05/26/2020-trial of CPAP at 9 cm H2O.  Did not require supplemental oxygen  Assessment:  Pulmonary hypertension Secondary to WHO group 2 from diastolic heart failure and group 3 from OSA She does not appear to have significant pulmonary issues PFTs are normal and chest x-ray with no significant abnormality  Autoimmune, vasculitis, HIV, BNP work-up was normal Mildly elevated LFTs likely secondary to [facility]  She has moderate sleep apnea and has CPAP titration recommended at 9 cm Discussed with Dr. Radford Pax, cardiology.  We will start on CPAP and repeat echocardiogram after 3 to 6 months of adequate therapy. If pulmonary hypertension is persistent then get right heart cath   Plan/Recommendations: Start CPAP at 9 cm Follow-up in 3 months  Marshell Garfinkel MD [facility] 06/04/2020, 11:32 AM  CC: Leamon Arnt, MD

## 2020-06-04 NOTE — Progress Notes (Signed)
Patient seen in office 06/04/20 by Dr. Vaughan Browner.  Sleep results reviewed and DME order placed.

## 2020-06-04 NOTE — Addendum Note (Signed)
Addended by: Satira Sark D on: 06/04/2020 02:38 PM   Modules accepted: Orders

## 2020-06-04 NOTE — Progress Notes (Signed)
pft  

## 2020-06-04 NOTE — Progress Notes (Signed)
PFT done today. 

## 2020-06-04 NOTE — Patient Instructions (Signed)
Lung function test today are normal I do not see any evidence of lung issues As for your sleep study we will start you on CPAP therapy on 9 cm H2O with a Small size Resmed Full Face Mask AirFit F20 mask and heated humidification.  Follow-up in 3 months

## 2020-06-18 ENCOUNTER — Telehealth: Payer: Self-pay

## 2020-06-18 NOTE — Telephone Encounter (Signed)
See below

## 2020-06-18 NOTE — Telephone Encounter (Signed)
Pt is requesting prednisone for gout in her foot. Pt is scheduled for tomorrow with Aldona Bar.

## 2020-06-18 NOTE — Telephone Encounter (Signed)
Can you guys check on the status of her CPAP order?

## 2020-06-19 ENCOUNTER — Other Ambulatory Visit: Payer: Self-pay

## 2020-06-19 ENCOUNTER — Encounter: Payer: Self-pay | Admitting: Physician Assistant

## 2020-06-19 ENCOUNTER — Ambulatory Visit (INDEPENDENT_AMBULATORY_CARE_PROVIDER_SITE_OTHER): Payer: Medicare Other | Admitting: Physician Assistant

## 2020-06-19 VITALS — BP 110/70 | HR 57 | Temp 97.6°F | Ht 61.0 in | Wt 150.5 lb

## 2020-06-19 DIAGNOSIS — M10071 Idiopathic gout, right ankle and foot: Secondary | ICD-10-CM

## 2020-06-19 MED ORDER — PREDNISONE 20 MG PO TABS: 40.0000 mg | ORAL_TABLET | Freq: Every day | ORAL | 0 refills | Status: DC

## 2020-06-19 NOTE — Progress Notes (Signed)
Madison Clements is a 74 y.o. female here for a new problem.  I acted as a Education administrator for [employer], PA-C Anselmo Pickler, LPN   History of Present Illness:   Chief Complaint  Patient presents with  . Gout  . Foot Pain    HPI   Foot pain Pt c/o right foot pain x 2 days and swelling. Hx of gout. Pt has been taking Celebrex with some relief. Last flare with gout was about 6 years ago.  This was in her R great toe joint as well (where it is presenting today.) Denies: fevers, chills, malaise, injury to foot, malaise.   Past Medical History:  Diagnosis Date  . Arthritis   . Atypical mole 02/24/2015   RGHT LATERAL THIGH MODERATE  . Atypical nevi 02/24/2015   RIGHT NECK MILD  . Atypical nevi 08/25/2015   LEFT UPPER ARM MILD  . Atypical nevi 08/25/2015   LEFT LATERAL FOREARM MILD/FREE  . Atypical nevi 02/23/2016   LEFT THIGH MODERATE/FREE  . Atypical nevi 02/23/2016   LEFT MEDIAL LEG MODERATE/FREE  . Atypical nevi 02/23/2016   LEFT UPPER BACK MILD /FREE  . Atypical nevi 08/23/2016   RIGHT ANT PROX THIGH MILD /FREE  . Atypical nevi 07/25/2018   MID LOWER BACK MODERATE  . Atypical nevi 01/22/2019   LEFT MID BACK MILD /FREE  . Atypical nevi 01/22/2019   LEFT NECK MILD  . Atypical nevi 08/06/2019   LEFT INNER BREAST MODERATE W/S  . Atypical nevi 08/06/2019   LEFT OUTER SIDE MILD/FREE  . CAD (coronary artery disease), native coronary artery 02/07/2019   Minimal CAD at the time of cath [facility]  . Gastric polyps 10/02/2018   EGD 03/2015; benign  . GERD (gastroesophageal reflux disease) 10/02/2018  . History of melanoma 10/02/2018   Abdomen 2012; 2013; s/p local excisions.   . Hyperlipidemia   . Hypertension   . Osteopenia   . Polyp of colon, adenomatous 10/02/2018   Colonoscopy 03/2015; recheck in 2021  . Pulmonary hypertension, primary ([place]) 10/02/2018  . SCCA (squamous cell carcinoma) of skin 09/18/2017   RIGHT ANT. DISTAL LOWER LEG TREATED  BY DR. Delton Coombes  . Sleep apnea      Social History   Tobacco Use  . Smoking status: Former Smoker    Types: Cigarettes    Quit date: 09/12/1996    Years since quitting: 23.7  . Smokeless tobacco: Never Used  Vaping Use  . Vaping Use: Never used  Substance Use Topics  . Alcohol use: Yes    Comment: 4 to 5 times weekly  . Drug use: Never    Past Surgical History:  Procedure Laterality Date  . ABDOMINAL HYSTERECTOMY    . APPENDECTOMY  1962  . BLADDER SUSPENSION    . MENISCUS REPAIR  2001  . thumb surgery      Family History  Problem Relation Age of Onset  . Arthritis Mother   . High blood pressure Mother   . Heart attack Father   . Heart disease Father   . High blood pressure Father   . Arthritis Sister   . Diabetes Sister   . High Cholesterol Sister   . High blood pressure Sister   . Breast cancer Sister   . Early death Maternal Grandmother        Stroke at 14  . High blood pressure Maternal Grandmother   . Asthma Maternal Grandfather   . Kidney disease Maternal Grandfather   .  Arthritis Paternal Grandmother   . Heart disease Paternal Grandmother   . High blood pressure Paternal Grandmother   . Kidney disease Paternal Grandfather     Allergies  Allergen Reactions  . Morphine Rash  . Morphine And Related Nausea And Vomiting and Rash    Current Medications:   Current Outpatient Medications:  .  celecoxib (CELEBREX) 100 MG capsule, Take 1 capsule (100 mg total) by mouth daily., Disp: 90 capsule, Rfl: 3 .  diltiazem (TIAZAC) 180 MG 24 hr capsule, Take 1 capsule (180 mg total) by mouth daily., Disp: 90 capsule, Rfl: 3 .  fenofibrate (TRICOR) 48 MG tablet, Take 1 tablet (48 mg total) by mouth daily., Disp: 90 tablet, Rfl: 0 .  folic acid (FOLVITE) 500 MCG tablet, Take 400 mcg by mouth daily., Disp: , Rfl:  .  furosemide (LASIX) 40 MG tablet, Take 1 tablet (40 mg total) by mouth daily., Disp: 90 tablet, Rfl: 3 .  lisinopril (ZESTRIL) 20 MG tablet, Take 0.5  tablets (10 mg total) by mouth daily., Disp: 90 tablet, Rfl: 3 .  omeprazole (PRILOSEC) 20 MG capsule, Take 1 capsule (20 mg total) by mouth daily., Disp: 90 capsule, Rfl: 3 .  Probiotic Product (ADVANCED PROBIOTIC PO), Take 1 capsule by mouth daily., Disp: , Rfl:  .  rosuvastatin (CRESTOR) 40 MG tablet, Take 1 tablet (40 mg total) by mouth at bedtime., Disp: 90 tablet, Rfl: 3 .  Specialty Vitamins Products (ONE-A-DAY BONE STRENGTH PO), Take 1 tablet by mouth every other day., Disp: , Rfl:  .  triamcinolone cream (KENALOG) 0.1 %, Apply 1 application topically daily as needed., Disp: 453 g, Rfl: 3 .  predniSONE (DELTASONE) 20 MG tablet, Take 2 tablets (40 mg total) by mouth daily., Disp: 10 tablet, Rfl: 0   Review of Systems:   ROS Negative unless otherwise specified per HPI.  Vitals:   Vitals:   06/19/20 1132  BP: 110/70  Pulse: (!) 57  Temp: 97.6 F (36.4 C)  TempSrc: Temporal  SpO2: 98%  Weight: 150 lb 8 oz (68.3 kg)  Height: 5\' 1"  (1.549 m)     Body mass index is 28.44 kg/m.  Physical Exam:   Physical Exam Vitals and nursing note reviewed.  Constitutional:      General: She is not in acute distress.    Appearance: She is well-developed. She is not ill-appearing or toxic-appearing.  Cardiovascular:     Rate and Rhythm: Normal rate and regular rhythm.     Pulses: Normal pulses.     Heart sounds: Normal heart sounds, S1 normal and S2 normal.     Comments: No LE edema Pulmonary:     Effort: Pulmonary effort is normal.     Breath sounds: Normal breath sounds.  Musculoskeletal:     Comments: R MTP joint with erythema and TTP; decreased ROM 2/2 pain  Skin:    General: Skin is warm and dry.  Neurological:     Mental Status: She is alert.     GCS: GCS eye subscore is 4. GCS verbal subscore is 5. GCS motor subscore is 6.  Psychiatric:        Speech: Speech normal.        Behavior: Behavior normal. Behavior is cooperative.       Assessment and Plan:   Madison Clements was  seen today for gout and foot pain.  Diagnoses and all orders for this visit:  Acute idiopathic gout involving toe of right foot  Other orders -  predniSONE (DELTASONE) 20 MG tablet; Take 2 tablets (40 mg total) by mouth daily.   No red flags on exam. Typical gout flare for patient. Start oral prednisone. Hold celebrex while on this. Follow-up if any worsening or lack of improvement.  CMA or LPN served as scribe during this visit. History, Physical, and Plan performed by medical provider. The above documentation has been reviewed and is accurate and complete.   Inda Coke, PA-C

## 2020-06-19 NOTE — Patient Instructions (Signed)
It was great to see you!  Start oral prednisone for your gout.  If any concerns, let us know.  Have a wonderful weekend!  Take care,  Inda Coke PA-C

## 2020-06-22 ENCOUNTER — Other Ambulatory Visit: Payer: Self-pay | Admitting: Physician Assistant

## 2020-06-22 ENCOUNTER — Encounter: Payer: Self-pay | Admitting: Physician Assistant

## 2020-06-22 MED ORDER — PREDNISONE 20 MG PO TABS: 40.0000 mg | ORAL_TABLET | Freq: Every day | ORAL | 0 refills | Status: DC

## 2020-07-03 DIAGNOSIS — Z23 Encounter for immunization: Secondary | ICD-10-CM | POA: Diagnosis not present

## 2020-07-17 ENCOUNTER — Encounter: Payer: Medicare Other | Admitting: Family Medicine

## 2020-07-28 ENCOUNTER — Other Ambulatory Visit: Payer: Self-pay

## 2020-07-28 ENCOUNTER — Ambulatory Visit (INDEPENDENT_AMBULATORY_CARE_PROVIDER_SITE_OTHER): Payer: Medicare Other | Admitting: Family Medicine

## 2020-07-28 ENCOUNTER — Encounter: Payer: Self-pay | Admitting: Family Medicine

## 2020-07-28 VITALS — BP 126/78 | HR 65 | Temp 97.7°F | Ht 61.0 in | Wt 145.0 lb

## 2020-07-28 DIAGNOSIS — I270 Primary pulmonary hypertension: Secondary | ICD-10-CM

## 2020-07-28 DIAGNOSIS — K760 Fatty (change of) liver, not elsewhere classified: Secondary | ICD-10-CM

## 2020-07-28 DIAGNOSIS — M8580 Other specified disorders of bone density and structure, unspecified site: Secondary | ICD-10-CM

## 2020-07-28 DIAGNOSIS — R7309 Other abnormal glucose: Secondary | ICD-10-CM | POA: Diagnosis not present

## 2020-07-28 DIAGNOSIS — Z0000 Encounter for general adult medical examination without abnormal findings: Secondary | ICD-10-CM | POA: Diagnosis not present

## 2020-07-28 DIAGNOSIS — E782 Mixed hyperlipidemia: Secondary | ICD-10-CM | POA: Diagnosis not present

## 2020-07-28 DIAGNOSIS — Z131 Encounter for screening for diabetes mellitus: Secondary | ICD-10-CM | POA: Diagnosis not present

## 2020-07-28 DIAGNOSIS — I10 Essential (primary) hypertension: Secondary | ICD-10-CM

## 2020-07-28 DIAGNOSIS — G4733 Obstructive sleep apnea (adult) (pediatric): Secondary | ICD-10-CM | POA: Diagnosis not present

## 2020-07-28 MED ORDER — LISINOPRIL 10 MG PO TABS
10.0000 mg | ORAL_TABLET | Freq: Every day | ORAL | 3 refills | Status: DC
Start: 2020-07-28 — End: 2021-05-11

## 2020-07-28 MED ORDER — OMEPRAZOLE 20 MG PO CPDR
20.0000 mg | DELAYED_RELEASE_CAPSULE | Freq: Every day | ORAL | 3 refills | Status: DC
Start: 2020-07-28 — End: 2021-05-13

## 2020-07-28 MED ORDER — FENOFIBRATE 48 MG PO TABS: 48.0000 mg | ORAL_TABLET | Freq: Every day | ORAL | 0 refills | Status: DC

## 2020-07-28 MED ORDER — DILTIAZEM HCL ER BEADS 180 MG PO CP24
180.0000 mg | ORAL_CAPSULE | Freq: Every day | ORAL | 3 refills | Status: DC
Start: 2020-07-28 — End: 2021-05-11

## 2020-07-28 MED ORDER — CELECOXIB 100 MG PO CAPS
100.0000 mg | ORAL_CAPSULE | Freq: Every day | ORAL | 3 refills | Status: DC
Start: 2020-07-28 — End: 2021-05-13

## 2020-07-28 MED ORDER — ROSUVASTATIN CALCIUM 40 MG PO TABS
40.0000 mg | ORAL_TABLET | Freq: Every day | ORAL | 3 refills | Status: DC
Start: 2020-07-28 — End: 2021-05-11

## 2020-07-28 NOTE — Patient Instructions (Signed)
Please return in 6 months for hypertension follow up.  I will release your lab results to you on your MyChart account with further instructions. Please reply with any questions.   I have refilled all of your medications.   So glad you are doing well.   If you have any questions or concerns, please don't hesitate to send me a message via MyChart or call the office at [phone]. Thank you for visiting with [place] today! It's our pleasure caring for you.

## 2020-07-28 NOTE — Progress Notes (Signed)
Subjective  Chief Complaint  Patient presents with  . Annual Exam    fasting   . Medication Refill    on all of her medications, please send them to mail order [facility]   . Medication Dose Change    patient wants you to change her script to 10mg  for her lisinopril instead of it being 20mg  and she cut it in half.  . Medication Refill    patienst states that her cardiologist put her on Fenofibrate back in september and he only gave her a 90 day prescription she wants to discuss this medication with you to see if she still needs to take it if so she will need you to refill it and send it to [facility] also  . Sleep Apnea    patient wants to discuss this new diagnosis and she is now using a cpap  . Weight Loss    she wants to discuss the weight loss due to her diet changes, she's not concerned in a negative way but she still wants to talk about it.     HPI: Madison Clements is a 74 y.o. female who presents to [facility] today for a Female Wellness Visit. She also has the concerns and/or needs as listed above in the chief complaint. These will be addressed in addition to the Health Maintenance Visit.   Wellness Visit: annual visit with health maintenance review and exam without Pap   HM: all up to date: has colonoscopy scheduled with GI in 2 weeks. Feeling well overall. Eye exam up to date. imms up to date Chronic disease f/u and/or acute problem visit: (deemed necessary to be done in addition to the wellness visit):  HTN: well controlled on lisinopril 10 daily. No cp.   pulm htn and new OSA started cpap last week. Working on treatment and work up for [employer]. Reviewed cards and pulm notes.   CAD and HLD: now on statin and fenofibrate. Tolerating well.   Fatty liver: never work up for other liver disease by GI. Reviewed all labs. Ultrasound c/w fatty liver. She has changed diet and is losing weight. Avoiding alcohol for now. Feels good about changes.    Osteopenia by dexa; next due 2 years.   Assessment  1. Essential hypertension   2. Mixed hyperlipidemia   3. Pulmonary hypertension, primary ([place])   4. Osteopenia, unspecified location   5. OSA (obstructive sleep apnea)   6. Hepatic steatosis      Plan  Female Wellness Visit:  Age appropriate Health Maintenance and Prevention measures were discussed with patient. Included topics are cancer screening recommendations, ways to keep healthy (see AVS) including dietary and exercise recommendations, regular eye and dental care, use of seat belts, and avoidance of moderate alcohol use and tobacco use. Colonoscopy scheduled. Other screens up to date  BMI: discussed patient's BMI and encouraged positive lifestyle modifications to help get to or maintain a target BMI.  HM needs and immunizations were addressed and ordered. See below for orders. See HM and immunization section for updates. UTD  Routine labs and screening tests ordered including cmp, cbc and lipids where appropriate.  Discussed recommendations regarding Vit D and calcium supplementation (see AVS)  Chronic disease management visit and/or acute problem visit:  HTN: good control. Check renal function and electrolytes.  HLD and fatty liver: praised for diet changes and weight loss. Will monitor.   HLD: recheck lipids; continue statin and fenofibrate  osa on cpap.  pulm htn work up ongoing.    Follow up: 6 mo for HTN  Orders Placed This Encounter  Procedures  . CBC with Differential/Platelet  . COMPLETE METABOLIC PANEL WITH GFR  . Lipid panel  . TSH   Meds ordered this encounter  Medications  . celecoxib (CELEBREX) 100 MG capsule    Sig: Take 1 capsule (100 mg total) by mouth daily.    Dispense:  90 capsule    Refill:  3  . diltiazem (TIAZAC) 180 MG 24 hr capsule    Sig: Take 1 capsule (180 mg total) by mouth daily.    Dispense:  90 capsule    Refill:  3  . fenofibrate (TRICOR) 48 MG tablet    Sig: Take 1  tablet (48 mg total) by mouth daily.    Dispense:  90 tablet    Refill:  0  . lisinopril (ZESTRIL) 10 MG tablet    Sig: Take 1 tablet (10 mg total) by mouth daily.    Dispense:  90 tablet    Refill:  3  . omeprazole (PRILOSEC) 20 MG capsule    Sig: Take 1 capsule (20 mg total) by mouth daily.    Dispense:  90 capsule    Refill:  3  . rosuvastatin (CRESTOR) 40 MG tablet    Sig: Take 1 tablet (40 mg total) by mouth at bedtime.    Dispense:  90 tablet    Refill:  3      Lifestyle: Body mass index is 27.4 kg/m. Wt Readings from Last 3 Encounters:  07/28/20 145 lb (65.8 kg)  06/19/20 150 lb 8 oz (68.3 kg)  06/04/20 152 lb 6.4 oz (69.1 kg)   D  Patient Active Problem List   Diagnosis Date Noted  . Essential hypertension 10/02/2018    Priority: High  . Mixed hyperlipidemia 10/02/2018    Priority: High  . Pulmonary hypertension, primary ([place]) 10/02/2018    Priority: High    Diagnosed around 2010; has seen pulm and cards in the past.  Left heart catheterization in 2016 - minimal vascular disease Echocardiogram 02/2019 with worsening pulm htn: moderate. Mild diastolic dysfunction. Nl EF   . History of melanoma 10/02/2018    Priority: High    Abdomen 2012; 2013; s/p local excisions.    . Polyp of colon, adenomatous 10/02/2018    Priority: High    Colonoscopy 03/2015; recheck in 2021   . GERD (gastroesophageal reflux disease) 10/02/2018    Priority: Medium  . Osteopenia 10/02/2018    Priority: Medium    dexa 2020 t = -1.5 lowest, stable osteopenia. Repeat in 2 years. Dexa 2019: T = -1.4 lowest; recheck 2 years.    . Primary osteoarthritis involving multiple joints 10/02/2018    Priority: Medium    Daily celebrex; bilateral knees, h/o right meniscal repair; has had viscosupplementation and steroid injections in the past.  Bilateral hands,no back problems   . Gastric polyps 10/02/2018    Priority: Medium    EGD 03/2015; benign; done for dysphagia   . Hepatic steatosis  07/28/2020  . OSA (obstructive sleep apnea) 05/26/2020  . Elevated LFTs 01/14/2020    Neg Hep C ab; no other evaluation yet done. Mild. Variable.     Health Maintenance  Topic Date Due  . COLONOSCOPY  03/12/2020  . MAMMOGRAM  11/14/2020  . DEXA SCAN  11/14/2021  . INFLUENZA VACCINE  Completed  . COVID-19 Vaccine  Completed  . Hepatitis C Screening  Completed  .  PNA vac Low Risk Adult  Completed   Immunization History  Administered Date(s) Administered  . Fluad Quad(high Dose 65+) 07/16/2019  . Influenza, High Dose Seasonal PF 06/04/2020  . PFIZER SARS-COV-2 Vaccination 09/27/2019, 10/18/2019  . Pneumococcal Conjugate-13 07/18/2013  . Pneumococcal Polysaccharide-23 09/11/2009  . Zoster Recombinat (Shingrix) 08/10/2012   We updated and reviewed the patient's past history in detail and it is documented below. Allergies: Patient is allergic to morphine and morphine and related. Past Medical History Patient  has a past medical history of Arthritis, Atypical mole (02/24/2015), Atypical nevi (02/24/2015), Atypical nevi (08/25/2015), Atypical nevi (08/25/2015), Atypical nevi (02/23/2016), Atypical nevi (02/23/2016), Atypical nevi (02/23/2016), Atypical nevi (08/23/2016), Atypical nevi (07/25/2018), Atypical nevi (01/22/2019), Atypical nevi (01/22/2019), Atypical nevi (08/06/2019), Atypical nevi (08/06/2019), CAD (coronary artery disease), native coronary artery (02/07/2019), Fatty liver (05/21/2020), Gastric polyps (10/02/2018), GERD (gastroesophageal reflux disease) (10/02/2018), History of melanoma (10/02/2018), Hyperlipidemia, Hypertension, Osteopenia, Polyp of colon, adenomatous (10/02/2018), Pulmonary hypertension, primary ([place]) (10/02/2018), SCCA (squamous cell carcinoma) of skin (09/18/2017), Sleep apnea, and Sleep apnea (05/26/2020). Past Surgical History Patient  has a past surgical history that includes Meniscus repair (2001); Appendectomy (1962); Abdominal hysterectomy; Bladder  suspension; and thumb surgery. Family History: Patient family history includes Arthritis in her mother, paternal grandmother, and sister; Asthma in her maternal grandfather; Breast cancer in her sister; Diabetes in her sister; Early death in her maternal grandmother; Heart attack in her father; Heart disease in her father and paternal grandmother; High Cholesterol in her sister; High blood pressure in her father, maternal grandmother, mother, paternal grandmother, and sister; Kidney disease in her maternal grandfather and paternal grandfather. Social History:  Patient  reports that she quit smoking about 23 years ago. Her smoking use included cigarettes. She has never used smokeless tobacco. She reports current alcohol use. She reports that she does not use drugs.  Review of Systems: Constitutional: negative for fever or malaise Ophthalmic: negative for photophobia, double vision or loss of vision Cardiovascular: negative for chest pain, dyspnea on exertion, or new LE swelling Respiratory: negative for SOB or persistent cough Gastrointestinal: negative for abdominal pain, change in bowel habits or melena Genitourinary: negative for dysuria or gross hematuria, no abnormal uterine bleeding or disharge Musculoskeletal: negative for new gait disturbance or muscular weakness Integumentary: negative for new or persistent rashes, no breast lumps Neurological: negative for TIA or stroke symptoms Psychiatric: negative for SI or delusions Allergic/Immunologic: negative for hives  Patient Care Team    Relationship Specialty Notifications Start End  Leamon Arnt, MD PCP - General Family Medicine  10/02/18   Sueanne Margarita, MD Consulting Physician Cardiology  02/13/19   Hayden Pedro, MD Consulting Physician Ophthalmology  08/02/19   Warren Danes, PA-C Physician Assistant Dermatology  08/02/19     Objective  Vitals: BP 126/78   Pulse 65   Temp 97.7 F (36.5 C) (Temporal)   Ht 5\' 1"   (1.549 m)   Wt 145 lb (65.8 kg)   SpO2 98%   BMI 27.40 kg/m  General:  Well developed, well nourished, no acute distress  Psych:  Alert and orientedx3,normal mood and affect HEENT:  Normocephalic, atraumatic, non-icteric sclera,  supple neck without adenopathy, mass or thyromegaly Cardiovascular:  Normal S1, S2, RRR without gallop, rub + soft murmur Respiratory:  Good breath sounds bilaterally, CTAB with normal respiratory effort Gastrointestinal: normal bowel sounds, soft, non-tender, no noted masses. No HSM MSK: no deformities, contusions. Joints are without erythema or swelling. OA changes in hand Skin:  Warm, no rashes or suspicious lesions noted Neurologic:    Mental status is normal. CN 2-11 are normal. Gross motor and sensory exams are normal. Normal gait. No tremor   Commons side effects, risks, benefits, and alternatives for medications and treatment plan prescribed today were discussed, and the patient expressed understanding of the given instructions. Patient is instructed to call or message via MyChart if he/she has any questions or concerns regarding our treatment plan. No barriers to understanding were identified. We discussed Red Flag symptoms and signs in detail. Patient expressed understanding regarding what to do in case of urgent or emergency type symptoms.   Medication list was reconciled, printed and provided to the patient in AVS. Patient instructions and summary information was reviewed with the patient as documented in the AVS. This note was prepared with assistance of Dragon voice recognition software. Occasional wrong-word or sound-a-like substitutions may have occurred due to the inherent limitations of voice recognition software  This visit occurred during the SARS-CoV-2 public health emergency.  Safety protocols were in place, including screening questions prior to the visit, additional usage of staff PPE, and extensive cleaning of exam room while observing  appropriate contact time as indicated for disinfecting solutions.

## 2020-07-29 ENCOUNTER — Ambulatory Visit (AMBULATORY_SURGERY_CENTER): Payer: Self-pay

## 2020-07-29 ENCOUNTER — Other Ambulatory Visit: Payer: Self-pay

## 2020-07-29 VITALS — Ht 61.0 in | Wt 146.0 lb

## 2020-07-29 DIAGNOSIS — R1310 Dysphagia, unspecified: Secondary | ICD-10-CM

## 2020-07-29 DIAGNOSIS — K219 Gastro-esophageal reflux disease without esophagitis: Secondary | ICD-10-CM

## 2020-07-29 DIAGNOSIS — Z86010 Personal history of colonic polyps: Secondary | ICD-10-CM

## 2020-07-29 LAB — COMPLETE METABOLIC PANEL WITH GFR
AG Ratio: 2.2 (calc) (ref 1.0–2.5)
ALT: 22 U/L (ref 6–29)
AST: 24 U/L (ref 10–35)
Albumin: 4.3 g/dL (ref 3.6–5.1)
Alkaline phosphatase (APISO): 76 U/L (ref 37–153)
BUN/Creatinine Ratio: 15 (calc) (ref 6–22)
BUN: 15 mg/dL (ref 7–25)
CO2: 26 mmol/L (ref 20–32)
Calcium: 10.2 mg/dL (ref 8.6–10.4)
Chloride: 106 mmol/L (ref 98–110)
Creat: 1.01 mg/dL — ABNORMAL HIGH (ref 0.60–0.93)
GFR, Est African American: 63 mL/min/{1.73_m2} (ref >=60)
GFR, Est Non African American: 54 mL/min/{1.73_m2} — ABNORMAL LOW (ref >=60)
Globulin: 2 g/dL (calc) (ref 1.9–3.7)
Glucose, Bld: 98 mg/dL (ref 65–99)
Potassium: 3.7 mmol/L (ref 3.5–5.3)
Sodium: 141 mmol/L (ref 135–146)
Total Bilirubin: 0.7 mg/dL (ref 0.2–1.2)
Total Protein: 6.3 g/dL (ref 6.1–8.1)

## 2020-07-29 LAB — CBC WITH DIFFERENTIAL/PLATELET
Absolute Monocytes: 374 cells/uL (ref 200–950)
Basophils Absolute: 22 cells/uL (ref 0–200)
Basophils Relative: 0.5 %
Eosinophils Absolute: 189 cells/uL (ref 15–500)
Eosinophils Relative: 4.3 %
HCT: 43.8 % (ref 35.0–45.0)
Hemoglobin: 14.5 g/dL (ref 11.7–15.5)
Lymphs Abs: 1549 cells/uL (ref 850–3900)
MCH: 29.2 pg (ref 27.0–33.0)
MCHC: 33.1 g/dL (ref 32.0–36.0)
MCV: 88.3 fL (ref 80.0–100.0)
MPV: 10.5 fL (ref 7.5–12.5)
Monocytes Relative: 8.5 %
Neutro Abs: 2266 cells/uL (ref 1500–7800)
Neutrophils Relative %: 51.5 %
Platelets: 297 10*3/uL (ref 140–400)
RBC: 4.96 10*6/uL (ref 3.80–5.10)
RDW: 12 % (ref 11.0–15.0)
Total Lymphocyte: 35.2 %
WBC: 4.4 10*3/uL (ref 3.8–10.8)

## 2020-07-29 LAB — TSH: TSH: 1.2 mIU/L (ref 0.40–4.50)

## 2020-07-29 LAB — LIPID PANEL
Cholesterol: 121 mg/dL (ref <200)
HDL: 54 mg/dL (ref >=50)
LDL Cholesterol (Calc): 48 mg/dL (calc)
Non-HDL Cholesterol (Calc): 67 mg/dL (calc) (ref <130)
Total CHOL/HDL Ratio: 2.2 (calc) (ref <5.0)
Triglycerides: 107 mg/dL (ref <150)

## 2020-07-29 LAB — HEMOGLOBIN A1C
Hgb A1c MFr Bld: 5.4 % of total Hgb (ref <5.7)
Mean Plasma Glucose: 108 (calc)
eAG (mmol/L): 6 (calc)

## 2020-07-29 MED ORDER — NA SULFATE-K SULFATE-MG SULF 17.5-3.13-1.6 GM/177ML PO SOLN: 1.0000 | Freq: Once | ORAL | 0 refills | Status: AC

## 2020-07-29 NOTE — Progress Notes (Signed)
No egg or soy allergy known to patient  No issues with past sedation with any surgeries or procedures No intubation problems in the past  No FH of Malignant Hyperthermia No diet pills per patient No home 02 use per patient  No blood thinners per patient  Pt denies issues with constipation  No A fib or A flutter  EMMI video via MyChart  COVID 19 guidelines implemented in [facility] today with Pt and RN  Coupon given to pt in [facility] today , Code to Pharmacy  COVID vaccines completed on 10/2019 per pt;  Due to the COVID-19 pandemic we are asking patients to follow these guidelines. Please only bring one care partner. Please be aware that your care partner may wait in the car in the parking lot or if they feel like they will be too hot to wait in the car, they may wait in the lobby on the 4th floor. All care partners are required to wear a mask the entire time (we do not have any that we can provide them), they need to practice social distancing, and we will do a Covid check for all patient's and care partners when you arrive. Also we will check their temperature and your temperature. If the care partner waits in their car they need to stay in the parking lot the entire time and we will call them on their cell phone when the patient is ready for discharge so they can bring the car to the front of the building. Also all patient's will need to wear a mask into building.  

## 2020-08-14 ENCOUNTER — Encounter: Payer: Self-pay | Admitting: Internal Medicine

## 2020-08-14 ENCOUNTER — Ambulatory Visit (AMBULATORY_SURGERY_CENTER): Payer: Medicare Other | Admitting: Internal Medicine

## 2020-08-14 ENCOUNTER — Other Ambulatory Visit: Payer: Self-pay

## 2020-08-14 VITALS — BP 125/56 | HR 63 | Temp 97.8°F | Resp 12 | Ht 61.0 in | Wt 146.0 lb

## 2020-08-14 DIAGNOSIS — R1310 Dysphagia, unspecified: Secondary | ICD-10-CM

## 2020-08-14 DIAGNOSIS — Z86010 Personal history of colonic polyps: Secondary | ICD-10-CM | POA: Diagnosis not present

## 2020-08-14 DIAGNOSIS — K621 Rectal polyp: Secondary | ICD-10-CM

## 2020-08-14 DIAGNOSIS — Z1211 Encounter for screening for malignant neoplasm of colon: Secondary | ICD-10-CM | POA: Diagnosis not present

## 2020-08-14 DIAGNOSIS — K219 Gastro-esophageal reflux disease without esophagitis: Secondary | ICD-10-CM | POA: Diagnosis not present

## 2020-08-14 DIAGNOSIS — K317 Polyp of stomach and duodenum: Secondary | ICD-10-CM | POA: Diagnosis not present

## 2020-08-14 DIAGNOSIS — D120 Benign neoplasm of cecum: Secondary | ICD-10-CM

## 2020-08-14 DIAGNOSIS — D128 Benign neoplasm of rectum: Secondary | ICD-10-CM

## 2020-08-14 MED ORDER — SODIUM CHLORIDE 0.9 % IV SOLN: 500.0000 mL | Freq: Once | INTRAVENOUS | Status: DC

## 2020-08-14 NOTE — Progress Notes (Signed)
Pt's states no medical or surgical changes since previsit or office visit. 

## 2020-08-14 NOTE — Patient Instructions (Signed)
Discharge instructions given. Handouts on polyps,Gerd. Hiatal Hernia and a Dilatation diet. Resume previous medications. YOU HAD AN ENDOSCOPIC PROCEDURE TODAY AT [facility]:   Refer to the procedure report that was given to you for any specific questions about what was found during the examination.  If the procedure report does not answer your questions, please call your gastroenterologist to clarify.  If you requested that your care partner not be given the details of your procedure findings, then the procedure report has been included in a sealed envelope for you to review at your convenience later.  YOU SHOULD EXPECT: Some feelings of bloating in the abdomen. Passage of more gas than usual.  Walking can help get rid of the air that was put into your GI tract during the procedure and reduce the bloating. If you had a lower endoscopy (such as a colonoscopy or flexible sigmoidoscopy) you may notice spotting of blood in your stool or on the toilet paper. If you underwent a bowel prep for your procedure, you may not have a normal bowel movement for a few days.  Please Note:  You might notice some irritation and congestion in your nose or some drainage.  This is from the oxygen used during your procedure.  There is no need for concern and it should clear up in a day or so.  SYMPTOMS TO REPORT IMMEDIATELY:   Following lower endoscopy (colonoscopy or flexible sigmoidoscopy):  Excessive amounts of blood in the stool  Significant tenderness or worsening of abdominal pains  Swelling of the abdomen that is new, acute  Fever of 100F or higher   Following upper endoscopy (EGD)  Vomiting of blood or coffee ground material  New chest pain or pain under the shoulder blades  Painful or persistently difficult swallowing  New shortness of breath  Fever of 100F or higher  Black, tarry-looking stools  For urgent or emergent issues, a gastroenterologist can be reached at any hour by  calling [phone]. Do not use MyChart messaging for urgent concerns.    DIET:  We do recommend a small meal at first, but then you may proceed to your regular diet.  Drink plenty of fluids but you should avoid alcoholic beverages for 24 hours.  ACTIVITY:  You should plan to take it easy for the rest of today and you should NOT DRIVE or use heavy machinery until tomorrow (because of the sedation medicines used during the test).    FOLLOW UP: Our staff will call the number listed on your records 48-72 hours following your procedure to check on you and address any questions or concerns that you may have regarding the information given to you following your procedure. If we do not reach you, we will leave a message.  We will attempt to reach you two times.  During this call, we will ask if you have developed any symptoms of COVID 19. If you develop any symptoms (ie: fever, flu-like symptoms, shortness of breath, cough etc.) before then, please call [phone].  If you test positive for Covid 19 in the 2 weeks post procedure, please call and report this information to [place].    If any biopsies were taken you will be contacted by phone or by letter within the next 1-3 weeks.  Please call us at [phone] if you have not heard about the biopsies in 3 weeks.    SIGNATURES/CONFIDENTIALITY: You and/or your care partner have signed paperwork which will be entered into your electronic  medical record.  These signatures attest to the fact that that the information above on your After Visit Summary has been reviewed and is understood.  Full responsibility of the confidentiality of this discharge information lies with you and/or your care-partner.

## 2020-08-14 NOTE — Progress Notes (Signed)
lidoccaine buffer  robinol antisialogogue A and O x3. Report to RN. Tolerated MAC anesthesia well.Teeth unchanged after procedure.

## 2020-08-14 NOTE — Op Note (Signed)
[facility] Patient Name: Madison Clements Procedure Date: 08/14/2020 9:41 AM MRN: [id number] Endoscopist: Docia Chuck. Henrene Pastor , MD Age: 74 Referring MD:  Date of Birth: 1946-04-24 Gender: Female Account #: [id number] Procedure:                Upper GI endoscopy Indications:              Dysphagia Medicines:                Monitored Anesthesia Care Procedure:                Pre-Anesthesia Assessment:                           - Prior to the procedure, a History and Physical                            was performed, and patient medications and                            allergies were reviewed. The patient's tolerance of                            previous anesthesia was also reviewed. The risks                            and benefits of the procedure and the sedation                            options and risks were discussed with the patient.                            All questions were answered, and informed consent                            was obtained. Prior Anticoagulants: The patient has                            taken no previous anticoagulant or antiplatelet                            agents. ASA Grade Assessment: II - A patient with                            mild systemic disease. After reviewing the risks                            and benefits, the patient was deemed in                            satisfactory condition to undergo the procedure.                           After obtaining informed consent, the endoscope was  passed under direct vision. Throughout the                            procedure, the patient's blood pressure, pulse, and                            oxygen saturations were monitored continuously. The                            Endoscope was introduced through the mouth, and                            advanced to the second part of duodenum. The upper                            GI endoscopy was accomplished without  difficulty.                            The patient tolerated the procedure well. Scope In: Scope Out: Findings:                 The esophagus was normal. No obvious stricture.                            After completing the endoscopic survey, the scope                            was withdrawn. Empiric esophageal dilation was                            performed with a Maloney dilator with no resistance                            at 54 Fr. no heme.                           The stomach was normal save multiple benign fundic                            gland polyps. There was a small hiatal hernia.                           The examined duodenum was normal.                           The cardia and gastric fundus were normal on                            retroflexion. Complications:            No immediate complications. Estimated Blood Loss:     Estimated blood loss: none. Impression:               1. GERD  2. Dysphagia without obvious stricture status post                            dilation                           3. Benign fundic gland polyps. Otherwise normal EGD. Recommendation:           - Patient has a contact number available for                            emergencies. The signs and symptoms of potential                            delayed complications were discussed with the                            patient. Return to normal activities tomorrow.                            Written discharge instructions were provided to the                            patient.                           - Post dilation diet.                           - Continue present medications. Docia Chuck. Henrene Pastor, MD 08/14/2020 10:31:43 AM This report has been signed electronically.

## 2020-08-14 NOTE — Progress Notes (Signed)
Called to room to assist during endoscopic procedure.  Patient ID and intended procedure confirmed with present staff. Received instructions for my participation in the procedure from the performing physician.  

## 2020-08-14 NOTE — Op Note (Signed)
[facility] Patient Name: Madison Clements Procedure Date: 08/14/2020 9:41 AM MRN: [id number] Endoscopist: Docia Chuck. Henrene Pastor , MD Age: 74 Referring MD:  Date of Birth: December 25, 1945 Gender: Female Account #: [id number] Procedure:                Colonoscopy with cold snare polypectomy x 2 Indications:              High risk colon cancer surveillance: Personal                            history of non-advanced adenomas (elsewhere 2016) Medicines:                Monitored Anesthesia Care Procedure:                Pre-Anesthesia Assessment:                           - Prior to the procedure, a History and Physical                            was performed, and patient medications and                            allergies were reviewed. The patient's tolerance of                            previous anesthesia was also reviewed. The risks                            and benefits of the procedure and the sedation                            options and risks were discussed with the patient.                            All questions were answered, and informed consent                            was obtained. Prior Anticoagulants: The patient has                            taken no previous anticoagulant or antiplatelet                            agents. ASA Grade Assessment: II - A patient with                            mild systemic disease. After reviewing the risks                            and benefits, the patient was deemed in                            satisfactory condition to undergo the procedure.  After obtaining informed consent, the colonoscope                            was passed under direct vision. Throughout the                            procedure, the patient's blood pressure, pulse, and                            oxygen saturations were monitored continuously. The                            Colonoscope was introduced through the anus and                             advanced to the the cecum, identified by                            appendiceal orifice and ileocecal valve. The                            ileocecal valve, appendiceal orifice, and rectum                            were photographed. The quality of the bowel                            preparation was excellent. The colonoscopy was                            performed without difficulty. The patient tolerated                            the procedure well. The bowel preparation used was                            SUPREP via split dose instruction. Scope In: 9:58:53 AM Scope Out: 10:11:37 AM Scope Withdrawal Time: 0 hours 8 minutes 48 seconds  Total Procedure Duration: 0 hours 12 minutes 44 seconds  Findings:                 Two polyps were found in the rectum and cecum. The                            polyps were 2 to 3 mm in size. These polyps were                            removed with a cold snare. Resection and retrieval                            were complete.                           The exam was otherwise without abnormality on  direct and retroflexion views. Complications:            No immediate complications. Estimated blood loss:                            None. Estimated Blood Loss:     Estimated blood loss: none. Impression:               - Two 2 to 3 mm polyps in the rectum and in the                            cecum, removed with a cold snare. Resected and                            retrieved.                           - The examination was otherwise normal on direct                            and retroflexion views. Recommendation:           - Repeat colonoscopy is not recommended for                            surveillance.                           - Patient has a contact number available for                            emergencies. The signs and symptoms of potential                            delayed complications were  discussed with the                            patient. Return to normal activities tomorrow.                            Written discharge instructions were provided to the                            patient.                           - Resume previous diet.                           - Continue present medications.                           - Await pathology results. Docia Chuck. Henrene Pastor, MD 08/14/2020 10:27:37 AM This report has been signed electronically.

## 2020-08-17 ENCOUNTER — Encounter (INDEPENDENT_AMBULATORY_CARE_PROVIDER_SITE_OTHER): Payer: Medicare Other | Admitting: Ophthalmology

## 2020-08-17 ENCOUNTER — Other Ambulatory Visit: Payer: Self-pay

## 2020-08-17 DIAGNOSIS — I10 Essential (primary) hypertension: Secondary | ICD-10-CM

## 2020-08-17 DIAGNOSIS — H35033 Hypertensive retinopathy, bilateral: Secondary | ICD-10-CM | POA: Diagnosis not present

## 2020-08-17 DIAGNOSIS — H43813 Vitreous degeneration, bilateral: Secondary | ICD-10-CM

## 2020-08-17 DIAGNOSIS — H33301 Unspecified retinal break, right eye: Secondary | ICD-10-CM

## 2020-08-18 ENCOUNTER — Telehealth: Payer: Self-pay

## 2020-08-18 NOTE — Telephone Encounter (Signed)
  Follow up Call-  Call back number 08/14/2020  Post procedure Call Back phone  # [phone]  Permission to leave phone message Yes  Some recent data might be hidden     Patient questions:  Do you have a fever, pain , or abdominal swelling? No. Pain Score  0 *  Have you tolerated food without any problems? Yes.    Have you been able to return to your normal activities? Yes.    Do you have any questions about your discharge instructions: Diet   No. Medications  No. Follow up visit  No.  Do you have questions or concerns about your Care? No.  Actions: * If pain score is 4 or above: No action needed, pain <4.   1. Have you developed a fever since your procedure? no  2.   Have you had an respiratory symptoms (SOB or cough) since your procedure? no  3.   Have you tested positive for COVID 19 since your procedure no  4.   Have you had any family members/close contacts diagnosed with the COVID 19 since your procedure?  No     If yes to any of these questions please route to Joylene John, RN and Joella Prince, RN

## 2020-08-20 ENCOUNTER — Encounter: Payer: Self-pay | Admitting: Internal Medicine

## 2020-09-12 DIAGNOSIS — C50919 Malignant neoplasm of unspecified site of unspecified female breast: Secondary | ICD-10-CM

## 2020-09-12 HISTORY — DX: Malignant neoplasm of unspecified site of unspecified female breast: C50.919

## 2020-09-21 ENCOUNTER — Ambulatory Visit (INDEPENDENT_AMBULATORY_CARE_PROVIDER_SITE_OTHER): Payer: Medicare Other

## 2020-09-21 DIAGNOSIS — Z0000 Encounter for general adult medical examination without abnormal findings: Secondary | ICD-10-CM

## 2020-09-21 NOTE — Patient Instructions (Addendum)
Madison Clements , Thank you for taking time to come for your Medicare Wellness Visit. I appreciate your ongoing commitment to your health goals. Please review the following plan we discussed and let me know if I can assist you in the future.   Screening recommendations/referrals: Colonoscopy: Done 08/14/20 Mammogram: Done 11/15/19 Bone Density: Done 11/15/19 Recommended yearly ophthalmology/optometry visit for glaucoma screening and checkup Recommended yearly dental visit for hygiene and checkup  Vaccinations: Influenza vaccine: Done 06/04/20 Pneumococcal vaccine: Up to date Tdap vaccine: Not a candidate  Shingles vaccine: Zoster 2013   Covid-19:Completed 1/15 & 10/18/19  Advanced directives: Copies in chart  Conditions/risks identified: Maintain weight and walk more   Next appointment: Follow up in one year for your annual wellness visit    Preventive Care 65 Years and Older, Female Preventive care refers to lifestyle choices and visits with your health care provider that can promote health and wellness. What does preventive care include?  A yearly physical exam. This is also called an annual well check.  Dental exams once or twice a year.  Routine eye exams. Ask your health care provider how often you should have your eyes checked.  Personal lifestyle choices, including:  Daily care of your teeth and gums.  Regular physical activity.  Eating a healthy diet.  Avoiding tobacco and drug use.  Limiting alcohol use.  Practicing safe sex.  Taking low-dose aspirin every day.  Taking vitamin and mineral supplements as recommended by your health care provider. What happens during an annual well check? The services and screenings done by your health care provider during your annual well check will depend on your age, overall health, lifestyle risk factors, and family history of disease. Counseling  Your health care provider may ask you questions about your:  Alcohol use.  Tobacco  use.  Drug use.  Emotional well-being.  Home and relationship well-being.  Sexual activity.  Eating habits.  History of falls.  Memory and ability to understand (cognition).  Work and work Statistician.  Reproductive health. Screening  You may have the following tests or measurements:  Height, weight, and BMI.  Blood pressure.  Lipid and cholesterol levels. These may be checked every 5 years, or more frequently if you are over 19 years old.  Skin check.  Lung cancer screening. You may have this screening every year starting at age 42 if you have a 30-pack-year history of smoking and currently smoke or have quit within the past 15 years.  Fecal occult blood test (FOBT) of the stool. You may have this test every year starting at age 66.  Flexible sigmoidoscopy or colonoscopy. You may have a sigmoidoscopy every 5 years or a colonoscopy every 10 years starting at age 61.  Hepatitis C blood test.  Hepatitis B blood test.  Sexually transmitted disease (STD) testing.  Diabetes screening. This is done by checking your blood sugar (glucose) after you have not eaten for a while (fasting). You may have this done every 1-3 years.  Bone density scan. This is done to screen for osteoporosis. You may have this done starting at age 69.  Mammogram. This may be done every 1-2 years. Talk to your health care provider about how often you should have regular mammograms. Talk with your health care provider about your test results, treatment options, and if necessary, the need for more tests. Vaccines  Your health care provider may recommend certain vaccines, such as:  Influenza vaccine. This is recommended every year.  Tetanus, diphtheria, and  acellular pertussis (Tdap, Td) vaccine. You may need a Td booster every 10 years.  Zoster vaccine. You may need this after age 97.  Pneumococcal 13-valent conjugate (PCV13) vaccine. One dose is recommended after age 62.  Pneumococcal  polysaccharide (PPSV23) vaccine. One dose is recommended after age 2. Talk to your health care provider about which screenings and vaccines you need and how often you need them. This information is not intended to replace advice given to you by your health care provider. Make sure you discuss any questions you have with your health care provider. Document Released: 09/25/2015 Document Revised: 05/18/2016 Document Reviewed: 06/30/2015 Elsevier Interactive Patient Education  2017 [place] Prevention in the Home Falls can cause injuries. They can happen to people of all ages. There are many things you can do to make your home safe and to help prevent falls. What can I do on the outside of my home?  Regularly fix the edges of walkways and driveways and fix any cracks.  Remove anything that might make you trip as you walk through a door, such as a raised step or threshold.  Trim any bushes or trees on the path to your home.  Use bright outdoor lighting.  Clear any walking paths of anything that might make someone trip, such as rocks or tools.  Regularly check to see if handrails are loose or broken. Make sure that both sides of any steps have handrails.  Any raised decks and porches should have guardrails on the edges.  Have any leaves, snow, or ice cleared regularly.  Use sand or salt on walking paths during winter.  Clean up any spills in your garage right away. This includes oil or grease spills. What can I do in the bathroom?  Use night lights.  Install grab bars by the toilet and in the tub and shower. Do not use towel bars as grab bars.  Use non-skid mats or decals in the tub or shower.  If you need to sit down in the shower, use a plastic, non-slip stool.  Keep the floor dry. Clean up any water that spills on the floor as soon as it happens.  Remove soap buildup in the tub or shower regularly.  Attach bath mats securely with double-sided non-slip rug  tape.  Do not have throw rugs and other things on the floor that can make you trip. What can I do in the bedroom?  Use night lights.  Make sure that you have a light by your bed that is easy to reach.  Do not use any sheets or blankets that are too big for your bed. They should not hang down onto the floor.  Have a firm chair that has side arms. You can use this for support while you get dressed.  Do not have throw rugs and other things on the floor that can make you trip. What can I do in the kitchen?  Clean up any spills right away.  Avoid walking on wet floors.  Keep items that you use a lot in easy-to-reach places.  If you need to reach something above you, use a strong step stool that has a grab bar.  Keep electrical cords out of the way.  Do not use floor polish or wax that makes floors slippery. If you must use wax, use non-skid floor wax.  Do not have throw rugs and other things on the floor that can make you trip. What can I do with my stairs?  Do not leave any items on the stairs.  Make sure that there are handrails on both sides of the stairs and use them. Fix handrails that are broken or loose. Make sure that handrails are as long as the stairways.  Check any carpeting to make sure that it is firmly attached to the stairs. Fix any carpet that is loose or worn.  Avoid having throw rugs at the top or bottom of the stairs. If you do have throw rugs, attach them to the floor with carpet tape.  Make sure that you have a light switch at the top of the stairs and the bottom of the stairs. If you do not have them, ask someone to add them for you. What else can I do to help prevent falls?  Wear shoes that:  Do not have high heels.  Have rubber bottoms.  Are comfortable and fit you well.  Are closed at the toe. Do not wear sandals.  If you use a stepladder:  Make sure that it is fully opened. Do not climb a closed stepladder.  Make sure that both sides of the  stepladder are locked into place.  Ask someone to hold it for you, if possible.  Clearly mark and make sure that you can see:  Any grab bars or handrails.  First and last steps.  Where the edge of each step is.  Use tools that help you move around (mobility aids) if they are needed. These include:  Canes.  Walkers.  Scooters.  Crutches.  Turn on the lights when you go into a dark area. Replace any light bulbs as soon as they burn out.  Set up your furniture so you have a clear path. Avoid moving your furniture around.  If any of your floors are uneven, fix them.  If there are any pets around you, be aware of where they are.  Review your medicines with your doctor. Some medicines can make you feel dizzy. This can increase your chance of falling. Ask your doctor what other things that you can do to help prevent falls. This information is not intended to replace advice given to you by your health care provider. Make sure you discuss any questions you have with your health care provider. Document Released: 06/25/2009 Document Revised: 02/04/2016 Document Reviewed: 10/03/2014 Elsevier Interactive Patient Education  2017 [employer].

## 2020-09-21 NOTE — Progress Notes (Signed)
Virtual Visit via Telephone Note  I connected with  [name] on 09/21/20 at 10:15 AM EST by telephone and verified that I am speaking with the correct person using two identifiers.  Medicare Annual Wellness visit completed telephonically due to Covid-19 pandemic.   Persons participating in this call: This Health Coach and this patient.   Location: Patient: Home Provider: Office   I discussed the limitations, risks, security and privacy concerns of performing an evaluation and management service by telephone and the availability of in person appointments. The patient expressed understanding and agreed to proceed.  Unable to perform video visit due to video visit attempted and failed and/or patient does not have video capability.   Some vital signs may be absent or patient reported.   Willette Brace, LPN    Subjective:   Rodina Pinales is a 75 y.o. female who presents for Medicare Annual (Subsequent) preventive examination.  Review of Systems     Cardiac Risk Factors include: advanced age (>68men, >63 women);hypertension;dyslipidemia     Objective:    There were no vitals filed for this visit. There is no height or weight on file to calculate BMI.  Advanced Directives 09/21/2020 05/26/2020 08/02/2019  Does Patient Have a Medical Advance Directive? Yes Yes Yes  Type of Paramedic of [place];Living will Living will;Healthcare Power of Attorney  Does patient want to make changes to medical advance directive? - No - Patient declined No - Patient declined  Copy of [place] in Chart? Yes - validated most recent copy scanned in chart (See row information) No - copy requested Yes - validated most recent copy scanned in chart (See row information)    Current Medications (verified) Outpatient Encounter Medications as of 09/21/2020  Medication Sig  . celecoxib (CELEBREX) 100 MG capsule Take 1  capsule (100 mg total) by mouth daily.  Marland Kitchen diltiazem (TIAZAC) 180 MG 24 hr capsule Take 1 capsule (180 mg total) by mouth daily.  . fenofibrate (TRICOR) 48 MG tablet Take 1 tablet (48 mg total) by mouth daily.  . folic acid (FOLVITE) 417 MCG tablet Take 400 mcg by mouth daily.  . furosemide (LASIX) 40 MG tablet Take 1 tablet (40 mg total) by mouth daily.  Marland Kitchen lisinopril (ZESTRIL) 10 MG tablet Take 1 tablet (10 mg total) by mouth daily.  Marland Kitchen omeprazole (PRILOSEC) 20 MG capsule Take 1 capsule (20 mg total) by mouth daily.  . Probiotic Product (ADVANCED PROBIOTIC PO) Take 1 capsule by mouth daily.  . rosuvastatin (CRESTOR) 40 MG tablet Take 1 tablet (40 mg total) by mouth at bedtime.  Marland Kitchen Specialty Vitamins Products (ONE-A-DAY BONE STRENGTH PO) Take 1 tablet by mouth daily.   Marland Kitchen triamcinolone cream (KENALOG) 0.1 % Apply 1 application topically daily as needed.   No facility-administered encounter medications on file as of 09/21/2020.    Allergies (verified) Morphine and Morphine and related   History: Past Medical History:  Diagnosis Date  . Arthritis    generalized  . Atypical mole 02/24/2015   RGHT LATERAL THIGH MODERATE  . Atypical nevi 02/24/2015   RIGHT NECK MILD  . Atypical nevi 08/25/2015   LEFT UPPER ARM MILD  . Atypical nevi 08/25/2015   LEFT LATERAL FOREARM MILD/FREE  . Atypical nevi 02/23/2016   LEFT THIGH MODERATE/FREE  . Atypical nevi 02/23/2016   LEFT MEDIAL LEG MODERATE/FREE  . Atypical nevi 02/23/2016   LEFT UPPER BACK MILD /FREE  . Atypical  nevi 08/23/2016   RIGHT ANT PROX THIGH MILD /FREE  . Atypical nevi 07/25/2018   MID LOWER BACK MODERATE  . Atypical nevi 01/22/2019   LEFT MID BACK MILD /FREE  . Atypical nevi 01/22/2019   LEFT NECK MILD  . Atypical nevi 08/06/2019   LEFT INNER BREAST MODERATE W/S  . Atypical nevi 08/06/2019   LEFT OUTER SIDE MILD/FREE  . Blood transfusion without reported diagnosis    hx of  . CAD (coronary artery disease), native coronary  artery 02/07/2019   Minimal CAD at the time of cath in 2016 [facility]  . Fatty liver 05/21/2020  . Gastric polyps 10/02/2018   EGD 03/2015; benign  . GERD (gastroesophageal reflux disease) 10/02/2018   on meds  . History of melanoma 10/02/2018   Abdomen 2012; 2013; s/p local excisions.   . Hyperlipidemia    on meds  . Hypertension    on meds  . Osteopenia   . Polyp of colon, adenomatous 10/02/2018   Colonoscopy 03/2015; recheck in 2021  . Pulmonary hypertension, primary ([facility]) 10/02/2018  . SCCA (squamous cell carcinoma) of skin 09/18/2017   RIGHT ANT. DISTAL LOWER LEG TREATED BY DR. Delton Coombes  . Sleep apnea   . Sleep apnea 05/26/2020   uses CPAP   Past Surgical History:  Procedure Laterality Date  . ABDOMINAL HYSTERECTOMY  1990  . APPENDECTOMY  1962  . BLADDER SUSPENSION  1990  . COLONOSCOPY  2016   in [place]-hx of polyps  . MENISCUS REPAIR Right 2001   right knee  . thumb surgery Left   . TONSILLECTOMY AND ADENOIDECTOMY    . tuabl ligation    . WISDOM TOOTH EXTRACTION     Family History  Problem Relation Age of Onset  . Arthritis Mother   . High blood pressure Mother   . Heart attack Father   . Heart disease Father   . High blood pressure Father   . Arthritis Sister   . Diabetes Sister   . High Cholesterol Sister   . High blood pressure Sister   . Breast cancer Sister   . Early death Maternal Grandmother        Stroke at 8  . High blood pressure Maternal Grandmother   . Asthma Maternal Grandfather   . Kidney disease Maternal Grandfather   . Arthritis Paternal Grandmother   . Heart disease Paternal Grandmother   . High blood pressure Paternal Grandmother   . Kidney disease Paternal Grandfather   . Colon polyps Neg Hx   . Colon cancer Neg Hx   . Esophageal cancer Neg Hx   . Rectal cancer Neg Hx   . Stomach cancer Neg Hx    Social History   Socioeconomic History  . Marital status: Widowed    Spouse name: Not on file  . Number of children: Not  on file  . Years of education: Not on file  . Highest education level: Not on file  Occupational History  . Occupation: Retired [employer]  Tobacco Use  . Smoking status: Former Smoker    Types: Cigarettes    Quit date: 09/12/1996    Years since quitting: 24.0  . Smokeless tobacco: Never Used  Vaping Use  . Vaping Use: Never used  Substance and Sexual Activity  . Alcohol use: Yes    Comment: occassional   . Drug use: Never  . Sexual activity: Not on file  Other Topics Concern  . Not on file  Social History Narrative  Widowed 2018 after 2 years of marriage   One daughter, 2 grandchildren. Lives in [place]   Sister lives in same complex in [place]    Previously lived in [place], [place]   Social Determinants of Health   Financial Resource Strain: [place]   . Difficulty of Paying Living Expenses: Not hard at all  Food Insecurity: No Food Insecurity  . Worried About Charity fundraiser in the Last Year: Never true  . Ran Out of Food in the Last Year: Never true  Transportation Needs: No Transportation Needs  . Lack of Transportation (Medical): No  . Lack of Transportation (Non-Medical): No  Physical Activity: Insufficiently Active  . Days of Exercise per Week: 2 days  . Minutes of Exercise per Session: 20 min  Stress: No Stress Concern Present  . Feeling of Stress : Not at all  Social Connections: Moderately Integrated  . Frequency of Communication with Friends and Family: More than three times a week  . Frequency of Social Gatherings with Friends and Family: More than three times a week  . Attends Religious Services: 1 to 4 times per year  . Active Member of Clubs or Organizations: Yes  . Attends Archivist Meetings: 1 to 4 times per year  . Marital Status: Widowed    Tobacco Counseling Counseling given: Not Answered   Clinical Intake:  Pre-visit preparation completed: Yes  Pain : No/denies pain     BMI - recorded: 27.6 Nutritional Status: BMI 25 -29  Overweight Nutritional Risks: None Diabetes: No  How often do you need to have someone help you when you read instructions, pamphlets, or other written materials from your doctor or pharmacy?: 1 - Never  Diabetic?No  Interpreter Needed?: No  Information entered by :: Charlott Rakes, LPN   Activities of Daily Living In your present state of health, do you have any difficulty performing the following activities: 09/21/2020 07/28/2020  Hearing? N N  Vision? N N  Difficulty concentrating or making decisions? N N  Walking or climbing stairs? Y N  Comment knee trouble at times -  Dressing or bathing? N N  Doing errands, shopping? N N  Preparing Food and eating ? N -  Using the Toilet? N -  In the past six months, have you accidently leaked urine? N -  Do you have problems with loss of bowel control? N -  Managing your Medications? N -  Managing your Finances? N -  Housekeeping or managing your Housekeeping? N -  Some recent data might be hidden    Patient Care Team: Leamon Arnt, MD as PCP - General (Family Medicine) Sueanne Margarita, MD as Consulting Physician (Cardiology) Hayden Pedro, MD as Consulting Physician (Ophthalmology) Warren Danes, PA-C as Physician Assistant (Dermatology)  Indicate any recent Medical Services you may have received from other than [facility] providers in the past year (date may be approximate).     Assessment:   This is a routine wellness examination for Harolyn.  Hearing/Vision screen  Hearing Screening   125Hz  250Hz  500Hz  1000Hz  2000Hz  3000Hz  4000Hz  6000Hz  8000Hz   Right ear:           Left ear:           Comments: Pt denies any hearing issues  Vision Screening Comments: Pt was a pt of Dr Zigmund Daniel will follow up with new provider this year  Dietary issues and exercise activities discussed: Current Exercise Habits: Home exercise routine, Type of exercise:  walking, Time (Minutes): 25, Frequency (Times/Week): 2, Weekly Exercise  (Minutes/Week): 50  Goals    . DIET - EAT MORE FRUITS AND VEGETABLES    . Patient Stated     Continue 5000 steps daily and aim to increase     . Patient Stated     Maintain weight and walk more       Depression Screen PHQ 2/9 Scores 09/21/2020 07/28/2020 08/02/2019 07/16/2019 04/29/2019  PHQ - 2 Score 0 0 0 0 0    Fall Risk Fall Risk  09/21/2020 07/28/2020 01/14/2020 08/02/2019 07/16/2019  Falls in the past year? 0 0 1 0 0  Number falls in past yr: 0 0 0 - 0  Injury with Fall? 0 0 0 0 0  Follow up Falls prevention discussed - - Falls evaluation completed;Education provided;Falls prevention discussed Falls evaluation completed    FALL RISK PREVENTION PERTAINING TO THE HOME:  Any stairs in or around the home? Yes  If so, are there any without handrails? No  Home free of loose throw rugs in walkways, pet beds, electrical cords, etc? Yes  Adequate lighting in your home to reduce risk of falls? Yes   ASSISTIVE DEVICES UTILIZED TO PREVENT FALLS:  Life alert? No  Use of a cane, walker or w/c? No  Grab bars in the bathroom? No  Shower chair or bench in shower? Yes Elevated toilet seat or a handicapped toilet? Yes   TIMED UP AND GO:  Was the test performed? No .    Cognitive Function:     6CIT Screen 09/21/2020  What Year? 0 points  What month? 0 points  Count back from 20 0 points  Months in reverse 0 points  Repeat phrase 0 points    Immunizations Immunization History  Administered Date(s) Administered  . Fluad Quad(high Dose 65+) 07/16/2019  . Influenza, High Dose Seasonal PF 06/04/2020  . PFIZER SARS-COV-2 Vaccination 09/27/2019, 10/18/2019, 07/03/2020  . Pneumococcal Conjugate-13 07/18/2013  . Pneumococcal Polysaccharide-23 09/11/2009  . Zoster Recombinat (Shingrix) 08/10/2012    TDAP status: Due, Education has been provided regarding the importance of this vaccine. Advised may receive this vaccine at local pharmacy or Health Dept. Aware to provide a copy of the  vaccination record if obtained from local pharmacy or Health Dept. Verbalized acceptance and understanding.  Flu Vaccine status: Up to date  Pneumococcal vaccine status: Up to date  Covid-19 vaccine status: Completed vaccines  Qualifies for Shingles Vaccine? Yes   Zostavax completed Yes   Shingrix Completed?: No.    Education has been provided regarding the importance of this vaccine. Patient has been advised to call insurance company to determine out of pocket expense if they have not yet received this vaccine. Advised may also receive vaccine at local pharmacy or Health Dept. Verbalized acceptance and understanding.  Screening Tests Health Maintenance  Topic Date Due  . MAMMOGRAM  11/14/2020  . COVID-19 Vaccine (4 - Booster for Pfizer series) 01/01/2021  . DEXA SCAN  11/14/2021  . COLONOSCOPY (Pts 45-3yrs Insurance coverage will need to be confirmed)  08/14/2025  . INFLUENZA VACCINE  Completed  . Hepatitis C Screening  Completed  . PNA vac Low Risk Adult  Completed    Health Maintenance  There are no preventive care reminders to display for this patient.  Colorectal cancer screening: Type of screening: Colonoscopy. Completed 08/14/20. Repeat every 5 years  Mammogram status: Completed 11/15/19. Repeat every year  Bone Density status: Completed 11/15/19. Results reflect: Bone density results: OSTEOPENIA.  Repeat every 2 years.   Additional Screening:  Hepatitis C Screening: Completed 05/13/20  Vision Screening: Recommended annual ophthalmology exams for early detection of glaucoma and other disorders of the eye. Is the patient up to date with their annual eye exam?  Yes  Who is the provider or what is the name of the office in which the patient attends annual eye exams? Dr Zigmund Daniel will seek new provider this year   Dental Screening: Recommended annual dental exams for proper oral hygiene  Community Resource Referral / Chronic Care Management: CRR required this visit?  No    CCM required this visit?  No      Plan:     I have personally reviewed and noted the following in the patient's chart:   . Medical and social history . Use of alcohol, tobacco or illicit drugs  . Current medications and supplements . Functional ability and status . Nutritional status . Physical activity . Advanced directives . List of other physicians . Hospitalizations, surgeries, and ER visits in previous 12 months . Vitals . Screenings to include cognitive, depression, and falls . Referrals and appointments  In addition, I have reviewed and discussed with patient certain preventive protocols, quality metrics, and best practice recommendations. A written personalized care plan for preventive services as well as general preventive health recommendations were provided to patient.     Willette Brace, LPN   [id number]   Nurse Notes: None

## 2020-09-25 ENCOUNTER — Ambulatory Visit: Payer: Medicare Other | Admitting: Pulmonary Disease

## 2020-10-06 ENCOUNTER — Other Ambulatory Visit: Payer: Self-pay | Admitting: Family Medicine

## 2020-10-07 ENCOUNTER — Ambulatory Visit (INDEPENDENT_AMBULATORY_CARE_PROVIDER_SITE_OTHER): Payer: Medicare Other | Admitting: Pulmonary Disease

## 2020-10-07 ENCOUNTER — Other Ambulatory Visit: Payer: Self-pay

## 2020-10-07 ENCOUNTER — Encounter: Payer: Self-pay | Admitting: Pulmonary Disease

## 2020-10-07 VITALS — BP 118/60 | HR 69 | Temp 97.3°F | Ht 61.0 in | Wt 148.4 lb

## 2020-10-07 DIAGNOSIS — I272 Pulmonary hypertension, unspecified: Secondary | ICD-10-CM | POA: Diagnosis not present

## 2020-10-07 DIAGNOSIS — G4733 Obstructive sleep apnea (adult) (pediatric): Secondary | ICD-10-CM | POA: Diagnosis not present

## 2020-10-07 NOTE — Progress Notes (Signed)
Madison Clements    [id number]    09-Aug-1946  Primary Care Physician:Andy, Karie Fetch, MD  Referring Physician: Leamon Arnt, [address] [place],  [place] [address]  Chief complaint: Follow-up for pulmonary hypertension  HPI: 75 year old with history of pulmonary hypertension, hyperlipidemia, skin cancer Referred for evaluation of pulmonary hypertension  This was initially diagnosed in 2010 in [place].  Previous echocardiogram with RVSP in the 30-40 range.  She was evaluated by pulmonologist in 2014 with normal PFTs and was told that she had mild OSA on sleep study that did not require treatment. More recently she has been having increasing dyspnea on exertion, lower extremity edema.  She has osteoarthritis.  Denies morning stiffness, difficulty swallowing, Raynaud's syndrome.  Recent echocardiogram with elevation in RVSP and has been referred to pulmonary for further evaluation.  She is also being evaluated for petechial rash by primary care  She follows with Dr. Radford Pax from cardiology Seen by Dr. Henrene Pastor, GI in September 2021 with work-up for elevated transaminases with ultrasound showing hepatic steatosis Work-up for alpha-1 antitrypsin, hepatitis, autoimmune hepatitis is negative.  Pets: No pets Occupation: Retired Arts development officer for [employer] Exposures: No known exposures.  No mold, hot tub, Jacuzzi.  No feather pillows or comforters Smoking history: 5-pack-year smoker.  Quit in 1998 Travel history: Previously lived in [place], [place].  No significant recent travel Relevant family history: No significant family history of lung disease  Interim history: Started on CPAP in November 2021 for diagnosis of moderate sleep apnea. She is using it every day but sometimes not more than 4 hours.  Has issues with mask fit and sleep quality while on CPAP.  Outpatient Encounter Medications as of 10/07/2020  Medication Sig  . celecoxib (CELEBREX) 100 MG  capsule Take 1 capsule (100 mg total) by mouth daily.  Marland Kitchen diltiazem (TIAZAC) 180 MG 24 hr capsule Take 1 capsule (180 mg total) by mouth daily.  . fenofibrate (TRICOR) 48 MG tablet Take 1 tablet (48 mg total) by mouth daily.  . folic acid (FOLVITE) 191 MCG tablet Take 400 mcg by mouth daily.  . furosemide (LASIX) 40 MG tablet TAKE 1 TABLET EVERY DAY  . lisinopril (ZESTRIL) 10 MG tablet Take 1 tablet (10 mg total) by mouth daily.  Marland Kitchen omeprazole (PRILOSEC) 20 MG capsule Take 1 capsule (20 mg total) by mouth daily.  . Probiotic Product (ADVANCED PROBIOTIC PO) Take 1 capsule by mouth daily.  . rosuvastatin (CRESTOR) 40 MG tablet Take 1 tablet (40 mg total) by mouth at bedtime.  Marland Kitchen Specialty Vitamins Products (ONE-A-DAY BONE STRENGTH PO) Take 1 tablet by mouth daily.   Marland Kitchen triamcinolone cream (KENALOG) 0.1 % Apply 1 application topically daily as needed.   No facility-administered encounter medications on file as of 10/07/2020.    Physical Exam: Blood pressure 118/60, pulse 69, temperature (!) 97.3 F (36.3 C), temperature source Skin, height 5\' 1"  (1.549 m), weight 148 lb 6.4 oz (67.3 kg), SpO2 98 %. Gen:      No acute distress HEENT:  EOMI, sclera anicteric Neck:     No masses; no thyromegaly Lungs:    Clear to auscultation bilaterally; normal respiratory effort CV:         Regular rate and rhythm; no murmurs Abd:      + bowel sounds; soft, non-tender; no palpable masses, no distension Ext:    No edema; adequate peripheral perfusion Skin:      Warm and  dry; no rash Neuro: alert and oriented x 3 Psych: normal mood and affect  Data Reviewed: Imaging: Chest x-ray 04/02/2020-normal.  I reviewed the images personally.  PFTs: 06/04/2020 FVC 2.58 [106%], FEV1 2.07 [115%], F/F 80, TLC 5.14 [111%], DLCO 14.57 [84%] Normal test  Labs: CMP from 01/14/2020-significant for slightly elevated transaminases and total bilirubin. 04/02/2020-CCP 28, negative Ro, La, SCL 70 and ANA, negative  HIV  Cardiac: Echocardiogram 03/17/2020-LVEF 60 to 78%, grade 1 diastolic dysfunction, moderately elevated RVSP of 46.2  Sleep: Home PSG 04/13/2020-moderate sleep apnea, AHI 18 with desats CPAP titration 05/26/2020-trial of CPAP at 9 cm H2O.  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  She is not comfortable with the mask and will get a mask fitting with DME.  If there are persistent issues then consider dental device. Discussed with Dr. Radford Pax, cardiology.  We will repeat echocardiogram after 3 to 6 months of adequate therapy. If pulmonary hypertension is persistent then get right heart cath  Plan/Recommendations: Continue CPAP at 9 cm Mask fitting  Marshell Garfinkel MD [facility] 10/07/2020, 10:25 AM  CC: Leamon Arnt, MD

## 2020-10-07 NOTE — Patient Instructions (Signed)
Will order mask fitting by the DME company to see if he can get a better fit with a mask Continue CPAP  Follow-up in 3 months.

## 2020-10-09 ENCOUNTER — Ambulatory Visit (INDEPENDENT_AMBULATORY_CARE_PROVIDER_SITE_OTHER): Payer: Medicare Other

## 2020-10-09 ENCOUNTER — Ambulatory Visit (INDEPENDENT_AMBULATORY_CARE_PROVIDER_SITE_OTHER): Payer: Medicare Other | Admitting: Family Medicine

## 2020-10-09 ENCOUNTER — Other Ambulatory Visit: Payer: Self-pay

## 2020-10-09 ENCOUNTER — Encounter: Payer: Self-pay | Admitting: Family Medicine

## 2020-10-09 VITALS — BP 136/68 | HR 77 | Temp 97.6°F | Resp 16 | Ht 61.0 in | Wt 145.6 lb

## 2020-10-09 DIAGNOSIS — M7062 Trochanteric bursitis, left hip: Secondary | ICD-10-CM

## 2020-10-09 DIAGNOSIS — M25511 Pain in right shoulder: Secondary | ICD-10-CM

## 2020-10-09 DIAGNOSIS — M7061 Trochanteric bursitis, right hip: Secondary | ICD-10-CM

## 2020-10-09 MED ORDER — TRIAMCINOLONE ACETONIDE 40 MG/ML IJ SUSP
40.0000 mg | Freq: Once | INTRAMUSCULAR | Status: AC
Start: 2020-10-09 — End: 2020-10-09
  Administered 2020-10-09: 40 mg via INTRA_ARTICULAR

## 2020-10-09 MED ORDER — DICLOFENAC SODIUM 1 % EX GEL: 2.0000 g | Freq: Four times a day (QID) | CUTANEOUS | 5 refills | Status: DC | PRN

## 2020-10-09 NOTE — Progress Notes (Signed)
Subjective  CC:  Chief Complaint  Patient presents with  . Wants injections in hips and right shoulder    Pain started about two months ago.    HPI: Madison Clements is a 75 y.o. female who presents to the office today to address the problems listed above in the chief complaint.  Right shoulder pain: 75 year old female with history of osteoarthritis of multiple joints presents due to 2 to 66-month history of right shoulder pain.  Described as a dull ache worse at night.  Pain is interfering with sleep.  She has recently started CPAP machine and is struggling to get used to that.  She has needed to sleep in a different position and thinks that could be contributing to pain.  She denies pain with certain movements.  No recent injuries, overuse.  No neck pain or radicular symptoms.  No weakness of the right upper extremities.  She points to the anterior shoulder.  She has no history of chronic shoulder problems.  Bilateral hip pain: Has history of trochanteric bursitis last treated with steroid injections in May of last year.  Pain is similar.  Pain with prolonged walking or tenderness when lying on her sides at night.  No injury or change in activity level.  She does have chronic knee arthritis and back arthritis.  Steroid injections last year significantly resolved her pain.  She would like to try this again.  She is on daily Celebrex for bilateral knee arthritis.  She denies back pain, buttock pain or radicular symptoms.   Assessment  1. Trochanteric bursitis of both hips   2. Acute pain of right shoulder      Plan   Bilateral greater trochanteric bursitis: Counseling done.  Educated on etiologies and conservative treatments.  Recommend stretching.  She is given steroid injections today.  See after visit summary for further education.  Right shoulder pain: Exam most consistent with bicep tendinitis.  Could also have arthritis in the joint.  Check x-rays.  Start rice therapy.  See after  visit summary.  Voltaren gel topically to add to the Celebrex daily.  If not improving the next 2 weeks, return for reevaluation.  Patient understands and agrees with care plan  Follow up: For shoulder pain if needed 01/25/2021, for hypertension check  Orders Placed This Encounter  Procedures  . DG Shoulder Right   Meds ordered this encounter  Medications  . diclofenac Sodium (VOLTAREN) 1 % GEL    Sig: Apply 2 g topically 4 (four) times daily as needed.    Dispense:  350 g    Refill:  5      I reviewed the patients updated PMH, FH, and SocHx.    Patient Active Problem List   Diagnosis Date Noted  . OSA (obstructive sleep apnea) 05/26/2020    Priority: High  . Essential hypertension 10/02/2018    Priority: High  . Mixed hyperlipidemia 10/02/2018    Priority: High  . Pulmonary hypertension, primary ([place]) 10/02/2018    Priority: High  . History of melanoma 10/02/2018    Priority: High  . Polyp of colon, adenomatous 10/02/2018    Priority: High  . Hepatic steatosis 07/28/2020    Priority: Medium  . GERD (gastroesophageal reflux disease) 10/02/2018    Priority: Medium  . Osteopenia 10/02/2018    Priority: Medium  . Primary osteoarthritis involving multiple joints 10/02/2018    Priority: Medium  . Gastric polyps 10/02/2018    Priority: Medium   Current Meds  Medication Sig  . celecoxib (CELEBREX) 100 MG capsule Take 1 capsule (100 mg total) by mouth daily.  . diclofenac Sodium (VOLTAREN) 1 % GEL Apply 2 g topically 4 (four) times daily as needed.  . diltiazem (TIAZAC) 180 MG 24 hr capsule Take 1 capsule (180 mg total) by mouth daily.  . fenofibrate (TRICOR) 48 MG tablet Take 1 tablet (48 mg total) by mouth daily.  . folic acid (FOLVITE) A999333 MCG tablet Take 400 mcg by mouth daily.  . furosemide (LASIX) 40 MG tablet TAKE 1 TABLET EVERY DAY  . lisinopril (ZESTRIL) 10 MG tablet Take 1 tablet (10 mg total) by mouth daily.  Marland Kitchen omeprazole (PRILOSEC) 20 MG capsule Take 1 capsule  (20 mg total) by mouth daily.  . Probiotic Product (ADVANCED PROBIOTIC PO) Take 1 capsule by mouth daily.  . rosuvastatin (CRESTOR) 40 MG tablet Take 1 tablet (40 mg total) by mouth at bedtime.  Marland Kitchen Specialty Vitamins Products (ONE-A-DAY BONE STRENGTH PO) Take 1 tablet by mouth daily.   Marland Kitchen triamcinolone cream (KENALOG) 0.1 % Apply 1 application topically daily as needed.    Allergies: Patient is allergic to morphine and morphine and related. Family History: Patient family history includes Arthritis in her mother, paternal grandmother, and sister; Asthma in her maternal grandfather; Breast cancer in her sister; Diabetes in her sister; Early death in her maternal grandmother; Heart attack in her father; Heart disease in her father and paternal grandmother; High Cholesterol in her sister; High blood pressure in her father, maternal grandmother, mother, paternal grandmother, and sister; Kidney disease in her maternal grandfather and paternal grandfather. Social History:  Patient  reports that she quit smoking about 24 years ago. Her smoking use included cigarettes. She has never used smokeless tobacco. She reports current alcohol use. She reports that she does not use drugs.  Review of Systems: Constitutional: Negative for fever malaise or anorexia Cardiovascular: negative for chest pain Respiratory: negative for SOB or persistent cough Gastrointestinal: negative for abdominal pain  Objective  Vitals: BP 136/68   Pulse 77   Temp 97.6 F (36.4 C)   Resp 16   Ht 5\' 1"  (1.549 m)   Wt 145 lb 9.6 oz (66 kg)   SpO2 97%   BMI 27.51 kg/m  General: no acute distress , A&Ox3 Right shoulder: Full passive and active range of motion, tenderness over anterior shoulder, possible bicep tendon.  Negative empty can test.  Mild impingement sign. Bilateral greater trochanteric bursa are tender, full range of motion hips  GR Trochanteric Bursa steroid injection  Procedure Note   Pre-operative Diagnosis:  bilateral hip bursitis   Post-operative Diagnosis: same   Indications: pain   Anesthesia: cold spray   Procedure Details    Verbal consent was obtained for the procedure. Universal time out done. The point of maximum tenderness was identified and marked over the right  hip bursa first. The skin prepped with alcohol and cold spray used for anesthesia. A needle was advanced into the bursa and the steroid/lido (20mg  kenalog: 1.26ml lidocaine w/o epi) was administered easily. the same procedure was then repeated on the left side.   Complications:  None; patient tolerated the procedure well.    Commons side effects, risks, benefits, and alternatives for medications and treatment plan prescribed today were discussed, and the patient expressed understanding of the given instructions. Patient is instructed to call or message via MyChart if he/she has any questions or concerns regarding our treatment plan. No barriers to understanding were identified.  We discussed Red Flag symptoms and signs in detail. Patient expressed understanding regarding what to do in case of urgent or emergency type symptoms.   Medication list was reconciled, printed and provided to the patient in AVS. Patient instructions and summary information was reviewed with the patient as documented in the AVS. This note was prepared with assistance of Dragon voice recognition software. Occasional wrong-word or sound-a-like substitutions may have occurred due to the inherent limitations of voice recognition software  This visit occurred during the SARS-CoV-2 public health emergency.  Safety protocols were in place, including screening questions prior to the visit, additional usage of staff PPE, and extensive cleaning of exam room while observing appropriate contact time as indicated for disinfecting solutions.

## 2020-10-09 NOTE — Patient Instructions (Addendum)
Please follow up as scheduled for your next visit with me: 01/25/2021  Sooner for shoulder pain if needed.  Start icing the shoulder in the front 2-3x/day for about 10-15 minutes. We will see if the xray shows arthritis or not. You have a tendonitis and ice is great to decrease inflammation. You may also rub voltaren gel over the sore spot up to 4x/day to see if that helps the pain.   If you have any questions or concerns, please don't hesitate to send me a message via MyChart or call the office at [phone]. Thank you for visiting with [place] today! It's our pleasure caring for you.  You had a steroid injection today.   Things to be aware of after this injection are listed below:  You may experience no significant improvement or even a slight worsening in your symptoms during the first 24 to 48 hours.  After that we expect your symptoms to improve gradually over the next 2 weeks for the medicine to have its maximal effect.  You should continue to have improvement out to 6 weeks after your injection.  I recommend icing the site of the injection for 20 minutes  1-2 times the day of your injection  You may shower but no swimming, tub bath or Jacuzzi for 24 hours.  If your bandage falls off this does not need to be replaced.  It is appropriate to remove the bandage after 4 hours.  You may resume light activities as tolerated.     POSSIBLE PROCEDURE SIDE EFFECTS: The side effects of the injection are usually fairly minimal however if you may experience some of the following side effects that are usually self-limited and will is off on their own.  If you are concerned please feel free to call the office with questions:             Increased numbness or tingling             Nausea or vomiting             Swelling or bruising at the injection site    Please call our office if if you experience any of the following symptoms over the next week as these can be signs of infection:              Fever  greater than 100.54F             Significant swelling at the injection site             Significant redness or drainage from the injection site

## 2020-10-11 ENCOUNTER — Other Ambulatory Visit: Payer: Self-pay | Admitting: Family Medicine

## 2020-10-11 DIAGNOSIS — E782 Mixed hyperlipidemia: Secondary | ICD-10-CM

## 2020-10-13 ENCOUNTER — Other Ambulatory Visit: Payer: Self-pay | Admitting: Family Medicine

## 2020-10-13 DIAGNOSIS — Z1231 Encounter for screening mammogram for malignant neoplasm of breast: Secondary | ICD-10-CM

## 2020-11-09 ENCOUNTER — Encounter: Payer: Self-pay | Admitting: Family Medicine

## 2020-11-09 ENCOUNTER — Ambulatory Visit (INDEPENDENT_AMBULATORY_CARE_PROVIDER_SITE_OTHER): Payer: Medicare Other | Admitting: Family Medicine

## 2020-11-09 ENCOUNTER — Other Ambulatory Visit: Payer: Self-pay

## 2020-11-09 VITALS — BP 156/82 | HR 70 | Wt 148.2 lb

## 2020-11-09 DIAGNOSIS — R519 Headache, unspecified: Secondary | ICD-10-CM | POA: Diagnosis not present

## 2020-11-09 DIAGNOSIS — R270 Ataxia, unspecified: Secondary | ICD-10-CM | POA: Diagnosis not present

## 2020-11-09 DIAGNOSIS — R42 Dizziness and giddiness: Secondary | ICD-10-CM | POA: Diagnosis not present

## 2020-11-09 MED ORDER — MECLIZINE HCL 25 MG PO TABS: 25.0000 mg | ORAL_TABLET | Freq: Three times a day (TID) | ORAL | 0 refills | Status: DC | PRN

## 2020-11-09 NOTE — Patient Instructions (Signed)
Please follow up if symptoms do not improve or as needed.   If anything worsens or if new symptoms develop, please seek care from the emergency room    Ataxia  Ataxia is a condition that causes unsteadiness when walking and standing, poor coordination of body movements, and difficulty keeping a straight (upright) posture. It occurs because of a problem with the part of the brain that controls coordination and stability (cerebellum). Ataxia can develop later in life (acquired ataxia), during your 20s or 30s or even into your 60s or later. This type of ataxia develops when another medical condition, such as a stroke, damages the cerebellum. Ataxia also may be present early in life (non-acquired ataxia). There are two main types of non-acquired ataxia:  Congenital. This type is present at birth.  Hereditary. This type is passed from parent to child. The most common form of hereditary non-acquired ataxia is Friedreich ataxia. What are the causes? Acquired ataxia may be caused by:  Changes in the nervous system (neurodegenerative changes).  Changes throughout the body (systemic disorders).  A lot of exposure to: ? Certain medicines such as phenytoin and lithium. ? Solvents. These are cleaning fluids such as paint thinner, nail polish remover, carpet cleaner, and degreasers.  Alcohol abuse (alcoholism).  Medical conditions, such as: ? Celiac disease. ? Hypothyroidism. ? A lack (deficiency) of vitamin E, vitamin B12, or thiamine. ? Brain tumors. ? Multiple sclerosis. ? Cerebral palsy. ? Stroke. ? Paraneoplastic syndromes. ? Viral infections. ? Head injury. ? Malnutrition. Congenital and hereditary ataxia are caused by problems that are present in genes before birth. What are the signs or symptoms? Signs and symptoms of ataxia vary depending on the cause. They may include:  Being unsteady.  Walking with the legs wide apart (wide stance) to keep one's balance.  Uncontrolled  shaking (tremor).  Poorly coordinated body movements.  Difficulty maintaining an upright posture.  Fatigue.  Changes in speech.  Changes in vision.  Involuntary eye movements (nystagmus).  Difficulty swallowing.  Difficulty writing.  Muscle tightening that you cannot control (muscle spasms). How is this diagnosed? Ataxia may be diagnosed based on:  Your personal and family medical history.  A physical exam.  Imaging tests, such as a CT scan or MRI.  Spinal tap (lumbar puncture). This procedure involves using a needle to take a sample of the fluid around your brain and spinal cord.  Genetic testing. How is this treated? The underlying condition that causes your ataxia needs to be treated. If the cause is a brain tumor, you may need surgery. Treatment also focuses on helping you live with ataxia and improving your quality of life (supportive treatments). This may involve:  Learning ways to improve coordination and move around more carefully (physical therapy).  Learning ways to improve your ability to do daily tasks, such as bathing and feeding yourself (occupational therapy).  Using devices to help you move around, eat, or communicate (assistive devices), such as a walker, modified eating utensils, and communication aids.  Learning ways to improve speech and swallowing (speech therapy). Follow these instructions at home: Preventing falls  Lie down right away if you become very unsteady, dizzy, or nauseous, or if you feel like you are going to faint. Do not get up until all of those feelings pass.  Keep your home well-lit. Use night-lights as needed.  Remove tripping hazards, such as rugs, cords, and clutter.  Install grab bars by the toilet and in the tub and shower.  Use assistive  devices such as a cane, walker, or wheelchair as needed to keep your balance. General instructions  Do not drink alcohol.  Ask your health care provider what activities are safe for  you, and what activities you should avoid.  Take over-the-counter and prescription medicines only as told by your health care provider. Get help right away if you:  Have unsteadiness that suddenly worsens.  Have any of these: ? Severe headaches. ? Chest pain. ? Abdominal pain. ? Weakness or numbness on one side of your body. ? Vision problems. ? Difficulty speaking. ? An irregular heartbeat. ? A very fast pulse.  Feel confused. Summary  Ataxia is a condition that causes unsteadiness when walking and standing, poor coordination of body movements, and difficulty keeping a straight (upright) posture.  Ataxia occurs because of a problem with the part of the brain that controls coordination and stability (cerebellum).  The underlying condition that causes your ataxia needs to be treated. Treatment also focuses on helping you live with ataxia and improving your quality of life (supportive treatments).  Lie down right away if you become very unsteady, dizzy, or nauseous, or if you feel like you are going to faint. This information is not intended to replace advice given to you by your health care provider. Make sure you discuss any questions you have with your health care provider. Document Revised: 06/10/2020 Document Reviewed: 06/30/2017 Elsevier Patient Education  2021 [employer].

## 2020-11-09 NOTE — Progress Notes (Signed)
Subjective  CC:  Chief Complaint  Patient presents with  . Dizziness    Started a week ago, "I feel like I am on a ship," c/o headaches and blurry vision. Did stop taking Fenofibrate last Monday     HPI: Madison Clements is a 75 y.o. female who presents to the office today to address the problems listed above in the chief complaint.  75 year old with hypertension, hyperlipidemia, pulmonary hypertension presents due to 1 week history of difficulty with balance and walking.  She reports that symptoms came on suddenly.  Last Monday she noticed when she was walking that she would fall towards the left.  She had to use the wall and furniture to steady herself.  She denies unilateral weakness.  She has had no numbness or tingling.  However her symptoms have persisted.  She also has a dull persistent frontal headache and today has been experience blurred vision.  She denies diplopia, neck stiffness, fever, dysarthria, problems with swallowing.  She has not been doing much due to her symptoms.  She has not fallen but continues to worry because she feels so off balance.  She is experienced no spinning sensations.  No nausea or vomiting.  She had no palpitations or chest pain.  No shortness of breath.   Assessment  1. Dysequilibrium   2. Ataxia   3. Acute intractable headache, unspecified headache type      Plan   Disequilibrium with ataxia, persistent headache: Recommend brain MRI due to persistent headache and 75 year old with disequilibrium and ataxia.  Her symptom complex are not consistent necessarily with positional vertigo.  Neurologic exam unrevealing although she has poor balance while walking.  Discussed fall prevention, red flag symptoms that would warrant emergency room evaluation and brain imaging.  Depending on her symptoms and the results, neurologic evaluation may be warranted.  Trial of meclizine in the meantime.  All questions answered  Follow up: As needed 01/25/2021  Orders  Placed This Encounter  Procedures  . MR Brain W Wo Contrast   Meds ordered this encounter  Medications  . meclizine (ANTIVERT) 25 MG tablet    Sig: Take 1 tablet (25 mg total) by mouth 3 (three) times daily as needed for dizziness.    Dispense:  30 tablet    Refill:  0      I reviewed the patients updated PMH, FH, and SocHx.    Patient Active Problem List   Diagnosis Date Noted  . OSA (obstructive sleep apnea) 05/26/2020    Priority: High  . Essential hypertension 10/02/2018    Priority: High  . Mixed hyperlipidemia 10/02/2018    Priority: High  . Pulmonary hypertension, primary ([place]) 10/02/2018    Priority: High  . History of melanoma 10/02/2018    Priority: High  . Polyp of colon, adenomatous 10/02/2018    Priority: High  . Hepatic steatosis 07/28/2020    Priority: Medium  . GERD (gastroesophageal reflux disease) 10/02/2018    Priority: Medium  . Osteopenia 10/02/2018    Priority: Medium  . Primary osteoarthritis involving multiple joints 10/02/2018    Priority: Medium  . Gastric polyps 10/02/2018    Priority: Medium   Current Meds  Medication Sig  . celecoxib (CELEBREX) 100 MG capsule Take 1 capsule (100 mg total) by mouth daily.  . diclofenac Sodium (VOLTAREN) 1 % GEL Apply 2 g topically 4 (four) times daily as needed.  . diltiazem (TIAZAC) 180 MG 24 hr capsule Take 1 capsule (180 mg total)  by mouth daily.  . folic acid (FOLVITE) 672 MCG tablet Take 400 mcg by mouth daily.  . furosemide (LASIX) 40 MG tablet TAKE 1 TABLET EVERY DAY  . lisinopril (ZESTRIL) 10 MG tablet Take 1 tablet (10 mg total) by mouth daily.  . meclizine (ANTIVERT) 25 MG tablet Take 1 tablet (25 mg total) by mouth 3 (three) times daily as needed for dizziness.  Marland Kitchen omeprazole (PRILOSEC) 20 MG capsule Take 1 capsule (20 mg total) by mouth daily.  . Probiotic Product (ADVANCED PROBIOTIC PO) Take 1 capsule by mouth daily.  . rosuvastatin (CRESTOR) 40 MG tablet Take 1 tablet (40 mg total) by mouth  at bedtime.  Marland Kitchen Specialty Vitamins Products (ONE-A-DAY BONE STRENGTH PO) Take 1 tablet by mouth daily.   Marland Kitchen triamcinolone cream (KENALOG) 0.1 % Apply 1 application topically daily as needed.    Allergies: Patient is allergic to morphine and morphine and related. Family History: Patient family history includes Arthritis in her mother, paternal grandmother, and sister; Asthma in her maternal grandfather; Breast cancer in her sister; Diabetes in her sister; Early death in her maternal grandmother; Heart attack in her father; Heart disease in her father and paternal grandmother; High Cholesterol in her sister; High blood pressure in her father, maternal grandmother, mother, paternal grandmother, and sister; Kidney disease in her maternal grandfather and paternal grandfather. Social History:  Patient  reports that she quit smoking about 24 years ago. Her smoking use included cigarettes. She has never used smokeless tobacco. She reports current alcohol use. She reports that she does not use drugs.  Review of Systems: Constitutional: Negative for fever malaise or anorexia Cardiovascular: negative for chest pain Respiratory: negative for SOB or persistent cough Gastrointestinal: negative for abdominal pain  Objective  Vitals: BP (!) 156/82   Pulse 70   Wt 148 lb 3.2 oz (67.2 kg)   SpO2 99%   BMI 28.00 kg/m  General: no acute distress , A&Ox3 HEENT: PEERL, conjunctiva normal, no nystagmus, neck is supple Cardiovascular:  RRR without murmur or gallop.  Respiratory:  Good breath sounds bilaterally, CTAB with normal respiratory effort Skin:  Warm, no rashes Neuro: Cranial nerves II through XII intact, unsteady gait.  Borderline Romberg.  Difficulty with walking heel-to-toe. Strength: 5 out of 5 throughout Downgoing Babinski Reflexes are difficult to elicit lower extremities. No vertigo with head movement.     Commons side effects, risks, benefits, and alternatives for medications and  treatment plan prescribed today were discussed, and the patient expressed understanding of the given instructions. Patient is instructed to call or message via MyChart if he/she has any questions or concerns regarding our treatment plan. No barriers to understanding were identified. We discussed Red Flag symptoms and signs in detail. Patient expressed understanding regarding what to do in case of urgent or emergency type symptoms.   Medication list was reconciled, printed and provided to the patient in AVS. Patient instructions and summary information was reviewed with the patient as documented in the AVS. This note was prepared with assistance of Dragon voice recognition software. Occasional wrong-word or sound-a-like substitutions may have occurred due to the inherent limitations of voice recognition software  This visit occurred during the SARS-CoV-2 public health emergency.  Safety protocols were in place, including screening questions prior to the visit, additional usage of staff PPE, and extensive cleaning of exam room while observing appropriate contact time as indicated for disinfecting solutions.

## 2020-11-16 ENCOUNTER — Encounter: Payer: Self-pay | Admitting: Family Medicine

## 2020-11-18 MED ORDER — MECLIZINE HCL 25 MG PO TABS: 25.0000 mg | ORAL_TABLET | Freq: Three times a day (TID) | ORAL | 0 refills | Status: DC | PRN

## 2020-11-27 ENCOUNTER — Inpatient Hospital Stay: Admission: RE | Admit: 2020-11-27 | Payer: Medicare Other | Source: Ambulatory Visit

## 2020-12-02 ENCOUNTER — Ambulatory Visit
Admission: RE | Admit: 2020-12-02 | Discharge: 2020-12-02 | Disposition: A | Discharge status: Home / Self Care | Payer: Medicare Other | Source: Ambulatory Visit | Attending: Family Medicine | Admitting: Family Medicine

## 2020-12-02 ENCOUNTER — Other Ambulatory Visit: Payer: Self-pay

## 2020-12-02 DIAGNOSIS — R42 Dizziness and giddiness: Secondary | ICD-10-CM

## 2020-12-02 DIAGNOSIS — R270 Ataxia, unspecified: Secondary | ICD-10-CM

## 2020-12-02 DIAGNOSIS — R519 Headache, unspecified: Secondary | ICD-10-CM | POA: Diagnosis not present

## 2020-12-02 MED ORDER — GADOBENATE DIMEGLUMINE 529 MG/ML IV SOLN
13.0000 mL | Freq: Once | INTRAVENOUS | Status: AC | PRN
  Administered 2020-12-02: 13 mL via INTRAVENOUS

## 2020-12-03 NOTE — Progress Notes (Signed)
Please call patient: I have reviewed his/her MRI results. Brain MRI shows some age related and vascular changes but no acute or worrisome findings are present. There are no lesions to explain her symptoms.  How is she doing. If improving, we can monitor. If persisting sxs or worsening, refer to neuro for further evaluation. Thanks.

## 2020-12-25 ENCOUNTER — Other Ambulatory Visit: Payer: Self-pay | Admitting: Family Medicine

## 2020-12-25 DIAGNOSIS — E782 Mixed hyperlipidemia: Secondary | ICD-10-CM

## 2020-12-26 DIAGNOSIS — J209 Acute bronchitis, unspecified: Secondary | ICD-10-CM | POA: Diagnosis not present

## 2021-01-19 ENCOUNTER — Telehealth: Payer: Self-pay | Admitting: Family Medicine

## 2021-01-19 ENCOUNTER — Ambulatory Visit
Admission: RE | Admit: 2021-01-19 | Discharge: 2021-01-19 | Disposition: A | Discharge status: Home / Self Care | Payer: Medicare Other | Source: Ambulatory Visit | Attending: Family Medicine | Admitting: Family Medicine

## 2021-01-19 ENCOUNTER — Other Ambulatory Visit: Payer: Self-pay

## 2021-01-19 DIAGNOSIS — Z1231 Encounter for screening mammogram for malignant neoplasm of breast: Secondary | ICD-10-CM

## 2021-01-19 NOTE — Chronic Care Management (AMB) (Signed)
  Chronic Care Management   Outreach Note  01/19/2021 Name: Madison Clements MRN: [id number] DOB: 05/02/1946  Referred by: Leamon Arnt, MD Reason for referral : No chief complaint on file.   An unsuccessful telephone outreach was attempted today. The patient was referred to the pharmacist for assistance with care management and care coordination.   Follow Up Plan:   Lauretta Grill [facility] Scheduler

## 2021-01-20 ENCOUNTER — Other Ambulatory Visit: Payer: Self-pay | Admitting: Family Medicine

## 2021-01-20 ENCOUNTER — Encounter: Payer: Self-pay | Admitting: Family Medicine

## 2021-01-20 ENCOUNTER — Telehealth: Payer: Self-pay | Admitting: Family Medicine

## 2021-01-20 DIAGNOSIS — R928 Other abnormal and inconclusive findings on diagnostic imaging of breast: Secondary | ICD-10-CM

## 2021-01-20 NOTE — Chronic Care Management (AMB) (Signed)
  Chronic Care Management   Note  01/20/2021 Name: Madison Clements MRN: [id number] DOB: 1946-05-28  Madison Clements is a 75 y.o. year old female who is a primary care patient of Leamon Arnt, MD. I reached out to [name] by phone today in response to a referral sent by Madison Clements's PCP, Leamon Arnt, MD.   Madison Clements was given information about Chronic Care Management services today including:  1. CCM service includes personalized support from designated clinical staff supervised by her physician, including individualized plan of care and coordination with other care providers 2. 24/7 contact phone numbers for assistance for urgent and routine care needs. 3. Service will only be billed when office clinical staff spend 20 minutes or more in a month to coordinate care. 4. Only one practitioner may furnish and bill the service in a calendar month. 5. The patient may stop CCM services at any time (effective at the end of the month) by phone call to the office staff.   Patient agreed to services and verbal consent obtained.   Follow up plan:   Lauretta Grill [employer] Scheduler

## 2021-01-22 ENCOUNTER — Ambulatory Visit
Admission: RE | Admit: 2021-01-22 | Discharge: 2021-01-22 | Disposition: A | Discharge status: Home / Self Care | Payer: Medicare Other | Source: Ambulatory Visit | Attending: Family Medicine | Admitting: Family Medicine

## 2021-01-22 ENCOUNTER — Other Ambulatory Visit: Payer: Self-pay | Admitting: Family Medicine

## 2021-01-22 ENCOUNTER — Other Ambulatory Visit: Payer: Self-pay

## 2021-01-22 DIAGNOSIS — N6489 Other specified disorders of breast: Secondary | ICD-10-CM

## 2021-01-22 DIAGNOSIS — R928 Other abnormal and inconclusive findings on diagnostic imaging of breast: Secondary | ICD-10-CM

## 2021-01-22 DIAGNOSIS — R922 Inconclusive mammogram: Secondary | ICD-10-CM | POA: Diagnosis not present

## 2021-01-25 ENCOUNTER — Ambulatory Visit: Payer: Medicare Other | Admitting: Family Medicine

## 2021-02-04 ENCOUNTER — Encounter: Payer: Self-pay | Admitting: Family Medicine

## 2021-02-04 ENCOUNTER — Other Ambulatory Visit: Payer: Self-pay

## 2021-02-04 ENCOUNTER — Ambulatory Visit (INDEPENDENT_AMBULATORY_CARE_PROVIDER_SITE_OTHER): Payer: Medicare Other | Admitting: Family Medicine

## 2021-02-04 VITALS — BP 132/76 | HR 59 | Temp 97.7°F | Ht 61.0 in | Wt 147.6 lb

## 2021-02-04 DIAGNOSIS — R001 Bradycardia, unspecified: Secondary | ICD-10-CM | POA: Diagnosis not present

## 2021-02-04 DIAGNOSIS — R928 Other abnormal and inconclusive findings on diagnostic imaging of breast: Secondary | ICD-10-CM

## 2021-02-04 DIAGNOSIS — M6740 Ganglion, unspecified site: Secondary | ICD-10-CM | POA: Diagnosis not present

## 2021-02-04 DIAGNOSIS — I10 Essential (primary) hypertension: Secondary | ICD-10-CM | POA: Diagnosis not present

## 2021-02-04 DIAGNOSIS — M62838 Other muscle spasm: Secondary | ICD-10-CM

## 2021-02-04 MED ORDER — CYCLOBENZAPRINE HCL 10 MG PO TABS: 5.0000 mg | ORAL_TABLET | Freq: Every evening | ORAL | 0 refills | Status: DC | PRN

## 2021-02-04 NOTE — Progress Notes (Signed)
Subjective  CC:  Chief Complaint  Patient presents with  . Hypertension    Taking medication as prescribed     HPI: Madison Clements is a 75 y.o. female who presents to the office today to address the problems listed above in the chief complaint.  Hypertension f/u: Control is good . Pt reports she is doing well. taking medications as instructed, no medication side effects noted, no TIAs, no chest pain on exertion, no dyspnea on exertion, no swelling of ankles.  She reports that during the night her Fitbit will occasionally document heart rates in the high 40s.  During the day she tends to average 60s.  She always feels well.  Denies lightheadedness or shortness of breath.  Denies adverse effects from his BP medications. Compliance with medication is good.   Positional vertigo: See last note.  Fortunately this has resolved.  She had a normal brain MRI except for mild vascular changes.  No new symptoms.  Has a knot on her right wrist.  Not painful.  Would like it checked.  4 days ago she experienced right-sided neck and occipital pain.  Hard to turn her head to the right.  Pain was moderate to severe, kept her awake because she could not get comfortable.  She since has used Advil and Tylenol and ice.  It is improving.  No systemic symptoms.  No fevers.  No headaches.  No neuro changes.  No injury.  Had recent mammogram which showed possible abnormality in the left breast.  Follow-up mammogram and ultrasound did not confirm any abnormalities.  They recommend follow-up in 6 months.  She has questions about this.  Assessment  1. Essential hypertension   2. Bradycardia   3. Ganglion cyst   4. Cervical paraspinal muscle spasm   5. Abnormal mammogram of left breast      Plan    Hypertension f/u: BP control is well controlled.  On Cardizem, lisinopril and Lasix.  Having mild bradycardia during sleep, asymptomatic.  She will continue to monitor.  Discussed red flag symptoms.  She denies  tachycardia or palpitations.  No further work-up needed at this time.  Ganglion cyst: Small and asymptomatic.  Monitor.  Cervical neck spasm, musculature: Supportive care.  Recommend heat, range of motion exercises, Advil as needed.  Improving.  No red flag symptoms noted.  Abnormal mammogram of left breast: I reviewed all reports.  Breast exam shows small palpable cystlike lesion.  Agree with recheck in 6 months.  Reassured.   Education regarding management of these chronic disease states was given. Management strategies discussed on successive visits include dietary and exercise recommendations, goals of achieving and maintaining IBW, and lifestyle modifications aiming for adequate sleep and minimizing stressors.   Follow up: 6 months for complete physical  No orders of the defined types were placed in this encounter.  Meds ordered this encounter  Medications  . cyclobenzaprine (FLEXERIL) 10 MG tablet    Sig: Take 0.5-1 tablets (5-10 mg total) by mouth at bedtime as needed for muscle spasms.    Dispense:  30 tablet    Refill:  0      BP Readings from Last 3 Encounters:  02/04/21 132/76  11/09/20 (!) 156/82  10/09/20 136/68   Wt Readings from Last 3 Encounters:  02/04/21 147 lb 9.6 oz (67 kg)  11/09/20 148 lb 3.2 oz (67.2 kg)  10/09/20 145 lb 9.6 oz (66 kg)    Lab Results  Component Value Date   CHOL  121 07/28/2020   CHOL 162 01/14/2020   CHOL 160 07/16/2019   Lab Results  Component Value Date   HDL 54 07/28/2020   HDL 55.30 01/14/2020   HDL 40.80 07/16/2019   Lab Results  Component Value Date   LDLCALC 48 07/28/2020   LDLCALC 101 07/27/2018   Lab Results  Component Value Date   TRIG 107 07/28/2020   TRIG 248.0 (H) 01/14/2020   TRIG 234.0 (H) 07/16/2019   Lab Results  Component Value Date   CHOLHDL 2.2 07/28/2020   CHOLHDL 3 01/14/2020   CHOLHDL 4 07/16/2019   Lab Results  Component Value Date   LDLDIRECT 68.0 01/14/2020   LDLDIRECT 78.0 07/16/2019    LDLDIRECT 93.0 04/29/2019   Lab Results  Component Value Date   CREATININE 1.01 (H) 07/28/2020   BUN 15 07/28/2020   NA 141 07/28/2020   K 3.7 07/28/2020   CL 106 07/28/2020   CO2 26 07/28/2020    The ASCVD Risk score (Goff DC Jr., et al., 2013) failed to calculate for the following reasons:   The valid total cholesterol range is 130 to 320 mg/dL  I reviewed the patients updated PMH, FH, and SocHx.    Patient Active Problem List   Diagnosis Date Noted  . OSA (obstructive sleep apnea) 05/26/2020    Priority: High  . Essential hypertension 10/02/2018    Priority: High  . Mixed hyperlipidemia 10/02/2018    Priority: High  . Pulmonary hypertension, primary ([place]) 10/02/2018    Priority: High  . History of melanoma 10/02/2018    Priority: High  . Polyp of colon, adenomatous 10/02/2018    Priority: High  . Hepatic steatosis 07/28/2020    Priority: Medium  . GERD (gastroesophageal reflux disease) 10/02/2018    Priority: Medium  . Osteopenia 10/02/2018    Priority: Medium  . Primary osteoarthritis involving multiple joints 10/02/2018    Priority: Medium  . Gastric polyps 10/02/2018    Priority: Medium    Allergies: Morphine and Morphine and related  Social History: Patient  reports that she quit smoking about 24 years ago. Her smoking use included cigarettes. She has never used smokeless tobacco. She reports current alcohol use. She reports that she does not use drugs.  Current Meds  Medication Sig  . celecoxib (CELEBREX) 100 MG capsule Take 1 capsule (100 mg total) by mouth daily.  . cyclobenzaprine (FLEXERIL) 10 MG tablet Take 0.5-1 tablets (5-10 mg total) by mouth at bedtime as needed for muscle spasms.  . diclofenac Sodium (VOLTAREN) 1 % GEL Apply 2 g topically 4 (four) times daily as needed.  . diltiazem (TIAZAC) 180 MG 24 hr capsule Take 1 capsule (180 mg total) by mouth daily.  . fenofibrate (TRICOR) 48 MG tablet TAKE 1 TABLET (48 MG TOTAL) BY MOUTH DAILY.  .  folic acid (FOLVITE) 295 MCG tablet Take 400 mcg by mouth daily.  . furosemide (LASIX) 40 MG tablet TAKE 1 TABLET EVERY DAY  . lisinopril (ZESTRIL) 10 MG tablet Take 1 tablet (10 mg total) by mouth daily.  Marland Kitchen omeprazole (PRILOSEC) 20 MG capsule Take 1 capsule (20 mg total) by mouth daily.  . Probiotic Product (ADVANCED PROBIOTIC PO) Take 1 capsule by mouth daily.  . rosuvastatin (CRESTOR) 40 MG tablet Take 1 tablet (40 mg total) by mouth at bedtime.  Marland Kitchen Specialty Vitamins Products (ONE-A-DAY BONE STRENGTH PO) Take 1 tablet by mouth daily.   Marland Kitchen triamcinolone cream (KENALOG) 0.1 % Apply 1 application topically daily as  needed.    Review of Systems: Cardiovascular: negative for chest pain, palpitations, leg swelling, orthopnea Respiratory: negative for SOB, wheezing or persistent cough Gastrointestinal: negative for abdominal pain Genitourinary: negative for dysuria or gross hematuria  Objective  Vitals: BP 132/76   Pulse (!) 59   Temp 97.7 F (36.5 C) (Temporal)   Ht 5\' 1"  (1.549 m)   Wt 147 lb 9.6 oz (67 kg)   SpO2 98%   BMI 27.89 kg/m  General: no acute distress  Psych:  Alert and oriented, normal mood and affect HEENT:  Normocephalic, atraumatic, supple neck  Cardiovascular:  RRR without murmur. no edema Respiratory:  Good breath sounds bilaterally, CTAB with normal respiratory effort Left breast exam: 4 to 5 mm palpable lump in the left inner breast.  Nontender, mobile.  Correlates to area of concern on her mammogram.  No nipple discharge.  No other masses noted.  No lymphadenopathy Right wrist volar 4 to 5 mm palpable cyst.  Nontender.   Commons side effects, risks, benefits, and alternatives for medications and treatment plan prescribed today were discussed, and the patient expressed understanding of the given instructions. Patient is instructed to call or message via MyChart if he/she has any questions or concerns regarding our treatment plan. No barriers to understanding were  identified. We discussed Red Flag symptoms and signs in detail. Patient expressed understanding regarding what to do in case of urgent or emergency type symptoms.   Medication list was reconciled, printed and provided to the patient in AVS. Patient instructions and summary information was reviewed with the patient as documented in the AVS. This note was prepared with assistance of Dragon voice recognition software. Occasional wrong-word or sound-a-like substitutions may have occurred due to the inherent limitations of voice recognition software  This visit occurred during the SARS-CoV-2 public health emergency.  Safety protocols were in place, including screening questions prior to the visit, additional usage of staff PPE, and extensive cleaning of exam room while observing appropriate contact time as indicated for disinfecting solutions.

## 2021-02-04 NOTE — Patient Instructions (Addendum)
Please return in 6 months for your annual complete physical; please come fasting.  You may use the muscle relaxer at night to help your neck pain.   I reviewed both your mammograms and ultrasound studies.  I agree with a recheck in 6 months.  Nothing worrisome was noted.  If you have any questions or concerns, please don't hesitate to send me a message via MyChart or call the office at [phone]. Thank you for visiting with [place] today! It's our pleasure caring for you.

## 2021-02-10 ENCOUNTER — Other Ambulatory Visit: Payer: Medicare Other

## 2021-02-25 ENCOUNTER — Telehealth: Payer: Self-pay

## 2021-02-25 ENCOUNTER — Ambulatory Visit: Payer: Medicare Other | Admitting: Physician Assistant

## 2021-02-25 NOTE — Chronic Care Management (AMB) (Signed)
Chronic Care Management Pharmacy Assistant   Name: Tanishka Drolet  MRN: [id number] DOB: 17-Jan-1946  Reason for Encounter: Chart Prep/ IQ  Recent office visits:  02/04/21- Billey Chang, MD- chronic conditions addressed, started cyclobenzaprine 5-10 mg at bedtime prn, follow up 6 months  11/09/20- Billey Chang, MD- seen for dysequilibrium, started meclizine 25 mg tid prn, imaging ordered, follow up as needed 10/09/20- Billey Chang, MD- started diclofenac sodium 2 gm qid prn, seen for trochanteric bursitis of both hips and acute pain of right shoulder, steroid injection administered, follow up as scheduled  Recent consult visits:  10/07/20- Marshell Garfinkel, MD (Pulmonology)- seen for follow-up of pulmonary hypertension, no medication changes, follow up 3 months   Hospital visits:  None in previous 6 months  Medications: Outpatient Encounter Medications as of 02/25/2021  Medication Sig   celecoxib (CELEBREX) 100 MG capsule Take 1 capsule (100 mg total) by mouth daily.   cyclobenzaprine (FLEXERIL) 10 MG tablet Take 0.5-1 tablets (5-10 mg total) by mouth at bedtime as needed for muscle spasms.   diclofenac Sodium (VOLTAREN) 1 % GEL Apply 2 g topically 4 (four) times daily as needed.   diltiazem (TIAZAC) 180 MG 24 hr capsule Take 1 capsule (180 mg total) by mouth daily.   fenofibrate (TRICOR) 48 MG tablet TAKE 1 TABLET (48 MG TOTAL) BY MOUTH DAILY.   folic acid (FOLVITE) 419 MCG tablet Take 400 mcg by mouth daily.   furosemide (LASIX) 40 MG tablet TAKE 1 TABLET EVERY DAY   lisinopril (ZESTRIL) 10 MG tablet Take 1 tablet (10 mg total) by mouth daily.   omeprazole (PRILOSEC) 20 MG capsule Take 1 capsule (20 mg total) by mouth daily.   Probiotic Product (ADVANCED PROBIOTIC PO) Take 1 capsule by mouth daily.   rosuvastatin (CRESTOR) 40 MG tablet Take 1 tablet (40 mg total) by mouth at bedtime.   Specialty Vitamins Products (ONE-A-DAY BONE STRENGTH PO) Take 1 tablet by mouth daily.     triamcinolone cream (KENALOG) 0.1 % Apply 1 application topically daily as needed.   No facility-administered encounter medications on file as of 02/25/2021.   Ms. Sunday Spillers returned my call this morning and she is very pleasant and knowledgeable. Overall she is doing well with her life and health. She recently lost 13 pounds after having unsatisfactory lipid labs. These labs encouraged her to change her eating habits and is the reason why she still maintains a specific poultry and fish diet. She is not a fan of exercise, but she does engage in mental activities. Four times a weeks for four hours a day she takes part in table top games with her group. She plays cards and more interestingly Mahjong. She has been playing for seven years and really enjoys doing so in her group and sometimes on cruises she attends.   Current Documented Medications (Patient reported recent delivery 12/22/20 for all maintenance medication) celecoxib 100 MG capsule- 90 DS last filled 10/06/20 cyclobenzaprine 10 MG tablet diclofenac Sodium 1 % GEL- 37 DS last filled 10/09/20 diltiazem  180 MG 24 hr capsule-  90 DS last filled 10/06/20 fenofibrate 48 MG tablet- 90 DS last filled 10/13/20 Patient reported not taking folic acid  379 MCG tablet furosemide  40 MG tablet 90 DS last filled 10/07/20 lisinopril  10 MG tablet -90 DS last filled 10/06/20 omeprazole 20 MG capsule- 90 DS last filled 10/06/20 Probiotic Product  rosuvastatin  40 MG tablet- 90 DS last filled 10/06/20 Specialty Vitamins Products triamcinolone cream  0.1 %  Have you seen any other providers since your last visit?  Patient stated she has not seen any other providers   Any changes in your medications or health?  Patient stated she stopped taking fenofibrate last month. She would like to review the necessity of continuing on this medication. She was told this would be a 6 month trial, but after having good labs she believes the work she has done is adequate  enough to stop completely  Any side effects from any medications?  Patient stated she currently has no side effects   Do you have an symptoms or problems not managed by your medications?  Patient stated she has no unmanaged symptoms   Any concerns about your health right now?  Patient stated she currently has no concerns for her health  Has your provider asked that you check blood pressure, blood sugar, or follow special diet at home?  Patient stated she uses her arm monitor to check her BP from time to time. She used to check it everyday, but since it has been normal she doesn't see the need to.  BP: 114/72 (05/24); 132/70 (05/09) Patient stated she follows a strict poultry and fish diet and rarely eats red meat  Do you get any type of exercise on a regular basis?  Patient stated she does not get much activity. She used to go on power walks with her friend, but she does not like the heat and hasn't done so lately. She usually get 3500 steps daily on her fitbit  She has only been to the pool 3 times and doesn't enjoy this activity much either.  Can you think of a goal you would like to reach for your health?  Patient stated that even though she has never liked exercise, she would like to be able to find the motivation to make it a part of her lifestyle. She understands how necessary it is for her health and hopes to include something some.  Do you have any problems getting your medications?  Patient stated she has no problems with [facility] mail delivery. She has an overabundance of some medications as she has automatic refills and stated "they don't let you run out"  Is there anything that you would like to discuss during the appointment?  Patient would like to discuss continuing fenofibrate  Please bring medications and supplements to appointment Reminded patient of initial office visit with CPP 06/20 at 2 pm  [facility] CPA, CMA

## 2021-03-01 ENCOUNTER — Ambulatory Visit (INDEPENDENT_AMBULATORY_CARE_PROVIDER_SITE_OTHER): Payer: Medicare Other

## 2021-03-01 ENCOUNTER — Other Ambulatory Visit: Payer: Self-pay

## 2021-03-01 DIAGNOSIS — E782 Mixed hyperlipidemia: Secondary | ICD-10-CM

## 2021-03-01 DIAGNOSIS — I10 Essential (primary) hypertension: Secondary | ICD-10-CM | POA: Diagnosis not present

## 2021-03-01 DIAGNOSIS — K219 Gastro-esophageal reflux disease without esophagitis: Secondary | ICD-10-CM

## 2021-03-01 DIAGNOSIS — M8580 Other specified disorders of bone density and structure, unspecified site: Secondary | ICD-10-CM

## 2021-03-01 NOTE — Patient Instructions (Signed)
Ms. Eckford,  Thank you for talking with me today. I have included our care plan/goals in the following pages.   Please review and call me at [phone] with any questions.  Thanks! Ellin Mayhew, Pharm.D., BCGP Clinical Pharmacist [facility] [phone] Patient Care Plan: ccm pharmacy care plan     Problem Identified: Osteopenia, HLD, GERD, HTN, OSA, OA   Priority: High  Onset Date: 03/01/2021     Long-Range Goal: Disease Management   Start Date: 03/01/2021  Expected End Date: 03/01/2022  This Visit's Progress: On track  Priority: High  Note:    Pharmacist Clinical Goal(s):  Patient will contact provider office for questions/concerns as evidenced notation of same in electronic health record through collaboration with PharmD and provider.   Interventions: 1:1 collaboration with Leamon Arnt, MD regarding development and update of comprehensive plan of care as evidenced by provider attestation and co-signature Inter-disciplinary care team collaboration (see longitudinal plan of care) Comprehensive medication review performed; medication list updated in electronic medical record  Hypertension (BP goal <130/80) -Controlled -Current treatment: Lisinopril 10 mg once daily Diltiazem CD 180 mg once daily  -Current home readings: 110s-130s/diastolic 33X-83A -Current dietary habits: minimal processed foods. White meat.  -Current exercise habits: will try to walk a couple times per week no formal exercise -Denies hypotensive/hypertensive symptoms -Educated on BP goals and benefits of medications for prevention of heart attack, stroke and kidney damage; Exercise goal of 150 minutes per week; -Counseled to monitor BP at home 1-2x every week, document, and provide log at future appointments -Recommended to continue current medication  Hyperlipidemia: (LDL goal < 70) -Controlled -Current treatment: Rosuvastatin 40 mg  once daily Fenofibrate 45 mg once daily (pt is holding) -Medications previously tried: atorvastatin  -Educated on Cholesterol goals;  Exercise goal of 150 minutes per week; -Recommended to continue current medication -Requesting lipid panel due to stopping medication   Osteopenia (Goal minimize symptoms ) -Controlled -Last DEXA Scan: 11/2019 - osteopenia   -Patient is not a candidate for pharmacologic treatment -Current treatment  Vitamin d3 2000 units once daily Bone builder supplement  -Recommend weight-bearing and muscle strengthening exercises for building and maintaining bone density. -Recommended to continue current medication  GERD (Goal: minimize symptoms ) -Controlled -Reports history of stricture  -Ongoing use of celebrex 100 mg once daily -Current treatment  Omeprazole 20 mg once daily -Recommended to continue current medication  Patient Goals/Self-Care Activities Patient will:  - take medications as prescribed target a minimum of 150 minutes of moderate intensity exercise weekly  Follow Up Plan: RPH f/u 6 months  Medication Assistance: None required.  Patient affirms current coverage meets needs.  Patient's preferred pharmacy is:  [facility] ([facility]) - [place], [address] [place] [address] Phone: [phone] Fax: [phone] PHARMACY [id number], [address] [place] [address] Phone: [phone] Fax: [phone]  Follow Up:  Patient agrees to Care Plan and Follow-up.  Plan: RPH f/u 6 months  CPA BP call 3 months    The patient was given the following information about Chronic Care Management services today, agreed to services, and gave verbal consent: 1. CCM service includes personalized support from designated clinical staff supervised by the primary care provider, including individualized plan of care  and coordination with other care providers 2. 24/7 contact phone  numbers for assistance for urgent and routine care needs. 3. Service will only be billed when office clinical staff spend 20 minutes or more in a month to coordinate care. 4. Only one practitioner may furnish and bill the service in a calendar month. 5.The patient may stop CCM services at any time (effective at the end of the month) by phone call to the office staff. 6. The patient will be responsible for cost sharing (co-pay) of up to 20% of the service fee (after annual deductible is met). Patient agreed to services and consent obtained.  The patient verbalized understanding of instructions provided today and agreed to receive a MyChart copy of patient instruction and/or educational materials. Telephone follow up appointment with pharmacy team member scheduled for: See next appointment with "Care Management Staff" under "What's Next" below. High Cholesterol  High cholesterol is a condition in which the blood has high levels of a white, waxy substance similar to fat (cholesterol). The liver makes all the cholesterol that the body needs. The human body needs small amounts of cholesterol to help build cells. A person gets extra orexcess cholesterol from the food that he or she eats. The blood carries cholesterol from the liver to the rest of the body. If you have high cholesterol, deposits (plaques) may build up on the walls of your arteries. Arteries are the blood vessels that carry blood away from your heart. These plaques make the arteries narrowand stiff. Cholesterol plaques increase your risk for heart attack and stroke. Work withyour health care provider to keep your cholesterol levels in a healthy range. What increases the risk? The following factors may make you more likely to develop this condition: Eating foods that are high in animal fat (saturated fat) or cholesterol. Being overweight. Not getting enough exercise. A family  history of high cholesterol (familial hypercholesterolemia). Use of tobacco products. Having diabetes. What are the signs or symptoms? There are no symptoms of this condition. How is this diagnosed? This condition may be diagnosed based on the results of a blood test. If you are older than 77 years of age, your health care provider may check your cholesterol levels every 4-6 years. You may be checked more often if you have high cholesterol or other risk factors for heart disease. The blood test for cholesterol measures: "Bad" cholesterol, or LDL cholesterol. This is the main type of cholesterol that causes heart disease. The desired level is less than 100 mg/dL. "Good" cholesterol, or HDL cholesterol. HDL helps protect against heart disease by cleaning the arteries and carrying the LDL to the liver for processing. The desired level for HDL is 60 mg/dL or higher. Triglycerides. These are fats that your body can store or burn for energy. The desired level is less than 150 mg/dL. Total cholesterol. This measures the total amount of cholesterol in your blood and includes LDL, HDL, and triglycerides. The desired level is less than 200 mg/dL. How is this treated? This condition may be treated with: Diet changes. You may be asked to eat foods that have more fiber and less saturated fats or added sugar. Lifestyle changes. These may include regular exercise, maintaining a healthy weight, and quitting use of tobacco products. Medicines. These are given when diet and lifestyle changes have not worked. You may be prescribed a statin medicine to help lower your cholesterol levels. Follow these instructions at home: Eating and drinking  Eat a healthy, balanced diet. This diet includes: Daily servings of a variety of fresh, frozen, or canned  fruits and vegetables. Daily servings of whole grain foods that are rich in fiber. Foods that are low in saturated fats and trans fats. These include poultry and fish  without skin, lean cuts of meat, and low-fat dairy products. A variety of fish, especially oily fish that contain omega-3 fatty acids. Aim to eat fish at least 2 times a week. Avoid foods and drinks that have added sugar. Use healthy cooking methods, such as roasting, grilling, broiling, baking, poaching, steaming, and stir-frying. Do not fry your food except for stir-frying.  Lifestyle  Get regular exercise. Aim to exercise for a total of 150 minutes a week. Increase your activity level by doing activities such as gardening, walking, and taking the stairs. Do not use any products that contain nicotine or tobacco, such as cigarettes, e-cigarettes, and chewing tobacco. If you need help quitting, ask your health care provider.  General instructions Take over-the-counter and prescription medicines only as told by your health care provider. Keep all follow-up visits as told by your health care provider. This is important. Where to find more information American Heart Association: www.heart.org National Heart, Lung, and Blood Institute: https://wilson-eaton.com/ Contact a health care provider if: You have trouble achieving or maintaining a healthy diet or weight. You are starting an exercise program. You are unable to stop smoking. Get help right away if: You have chest pain. You have trouble breathing. You have any symptoms of a stroke. "BE FAST" is an easy way to remember the main warning signs of a stroke: B - Balance. Signs are dizziness, sudden trouble walking, or loss of balance. E - Eyes. Signs are trouble seeing or a sudden change in vision. F - Face. Signs are sudden weakness or numbness of the face, or the face or eyelid drooping on one side. A - Arms. Signs are weakness or numbness in an arm. This happens suddenly and usually on one side of the body. S - Speech. Signs are sudden trouble speaking, slurred speech, or trouble understanding what people say. T - Time. Time to call emergency  services. Write down what time symptoms started. You have other signs of a stroke, such as: A sudden, severe headache with no known cause. Nausea or vomiting. Seizure. These symptoms may represent a serious problem that is an emergency. Do not wait to see if the symptoms will go away. Get medical help right away. Call your local emergency services (911 in the [place] Do not drive yourself to the hospital. Summary Cholesterol plaques increase your risk for heart attack and stroke. Work with your health care provider to keep your cholesterol levels in a healthy range. Eat a healthy, balanced diet, get regular exercise, and maintain a healthy weight. Do not use any products that contain nicotine or tobacco, such as cigarettes, e-cigarettes, and chewing tobacco. Get help right away if you have any symptoms of a stroke. This information is not intended to replace advice given to you by your health care provider. Make sure you discuss any questions you have with your healthcare provider. Document Revised: 07/29/2019 Document Reviewed: 07/29/2019 Elsevier Patient Education  2022 [employer].

## 2021-03-01 NOTE — Progress Notes (Signed)
Chronic Care Management Pharmacy Note  03/01/2021 Name:  Madison Clements MRN:  [id number] DOB:  [birth year]  Recommendations/Changes made from today's visit:  Requesting lipid panel repeat due to stopping fenofibrate for 1+ month. Ok to place order?  No other changes _____________  Subjective: Madison Clements is an 75 y.o. year old female who is a primary patient of Willow Ora, MD.  The CCM team was consulted for assistance with disease management and care coordination needs.    Engaged with patient face to face for initial visit in response to provider referral for pharmacy case management and/or care coordination services.   Consent to Services:  The patient was given the following information about Chronic Care Management services today, agreed to services, and gave verbal consent: 1. CCM service includes personalized support from designated clinical staff supervised by the primary care provider, including individualized plan of care and coordination with other care providers 2. 24/7 contact phone numbers for assistance for urgent and routine care needs. 3. Service will only be billed when office clinical staff spend 20 minutes or more in a month to coordinate care. 4. Only one practitioner may furnish and bill the service in a calendar month. 5.The patient may stop CCM services at any time (effective at the end of the month) by phone call to the office staff. 6. The patient will be responsible for cost sharing (co-pay) of up to 20% of the service fee (after annual deductible is met). Patient agreed to services and consent obtained.  Patient Care Team: Willow Ora, MD as PCP - General (Family Medicine) Quintella Reichert, MD as Consulting Physician (Cardiology) Sherrie George, MD as Consulting Physician (Ophthalmology) Glyn Ade, PA-C as Physician Assistant (Dermatology) Dahlia Byes, [facility] as Pharmacist (Pharmacist)  Recent office visits:  02/04/21- Asencion Partridge, MD- chronic conditions addressed, started cyclobenzaprine 5-10 mg at bedtime prn, follow up 6 months 11/09/20- Asencion Partridge, MD- seen for dysequilibrium, started meclizine 25 mg tid prn, imaging ordered, follow up as needed 10/09/20- Asencion Partridge, MD- started diclofenac sodium 2 gm qid prn, seen for trochanteric bursitis of both hips and acute pain of right shoulder, steroid injection administered, follow up as scheduled   Recent consult visits:  10/07/20- Chilton Greathouse, MD (Pulmonology)- seen for follow-up of pulmonary hypertension, no medication changes, follow up 3 months    Hospital visits:  None in previous 6 months  Objective:  Lab Results  Component Value Date   CREATININE 1.01 (H) 07/28/2020   CREATININE 1.17 01/14/2020   CREATININE 0.94 07/16/2019    Lab Results  Component Value Date   HGBA1C 5.4 07/28/2020   Last diabetic Eye exam: No results found for: HMDIABEYEEXA  Last diabetic Foot exam: No results found for: HMDIABFOOTEX      Component Value Date/Time   CHOL 121 07/28/2020 0901   TRIG 107 07/28/2020 0901   HDL 54 07/28/2020 0901   CHOLHDL 2.2 07/28/2020 0901   VLDL 49.6 (H) 01/14/2020 0904   LDLCALC 48 07/28/2020 0901   LDLDIRECT 68.0 01/14/2020 0904    Hepatic Function Latest Ref Rng & Units 07/28/2020 04/02/2020 01/14/2020  Total Protein 6.1 - 8.1 g/dL 6.3 7.4 7.3  Albumin 3.5 - 5.2 g/dL - 4.6 4.8  AST 10 - 35 U/L 24 30 39(H)  ALT 6 - 29 U/L 22 36(H) 41(H)  Alk Phosphatase 39 - 117 U/L - 86 113  Total Bilirubin 0.2 - 1.2 mg/dL 0.7 8.5(W) 1.3(H)  Bilirubin, Direct  0.0 - 0.3 mg/dL - 0.2 -    Lab Results  Component Value Date/Time   TSH 1.20 07/28/2020 09:01 AM   TSH 1.92 07/16/2019 09:13 AM    CBC Latest Ref Rng & Units 07/28/2020 04/02/2020 03/30/2020  WBC 3.8 - 10.8 Thousand/uL 4.4 6.2 8.7  Hemoglobin 11.7 - 15.5 g/dL 14.5 14.5 13.9  Hematocrit 35.0 - 45.0 % 43.8 42.0 40.8  Platelets 140 - 400 Thousand/uL 297 272.0 274.0   No results found  for: VD25OH  Clinical ASCVD:  The ASCVD Risk score Mikey Bussing DC Jr., et al., 2013) failed to calculate for the following reasons:   The valid total cholesterol range is 130 to 320 mg/dL    Social History   Tobacco Use  Smoking Status Former   Pack years: 0.00   Types: Cigarettes   Quit date: 09/12/1996   Years since quitting: 24.4  Smokeless Tobacco Never   BP Readings from Last 3 Encounters:  02/04/21 132/76  11/09/20 (!) 156/82  10/09/20 136/68   Pulse Readings from Last 3 Encounters:  02/04/21 (!) 59  11/09/20 70  10/09/20 77   Wt Readings from Last 3 Encounters:  02/04/21 147 lb 9.6 oz (67 kg)  11/09/20 148 lb 3.2 oz (67.2 kg)  10/09/20 145 lb 9.6 oz (66 kg)    Assessment: Review of patient past medical history, allergies, medications, health status, including review of consultants reports, laboratory and other test data, was performed as part of comprehensive evaluation and provision of chronic care management services.   SDOH:  (Social Determinants of Health) assessments and interventions performed: Yes   CCM Care Plan  Allergies  Allergen Reactions   Morphine Rash   Morphine And Related Nausea And Vomiting and Rash    Medications Reviewed Today     Reviewed by Madelin Rear, [facility] (Pharmacist) on 03/01/21 at 1500  Med List Status: <None>   Medication Order Taking? Sig Documenting Provider Last Dose Status Informant  celecoxib (CELEBREX) 100 MG capsule [id number] Yes Take 1 capsule (100 mg total) by mouth daily. Leamon Arnt, MD  Active   cyclobenzaprine (FLEXERIL) 10 MG tablet [id number]  Take 0.5-1 tablets (5-10 mg total) by mouth at bedtime as needed for muscle spasms. Leamon Arnt, MD  Active   diclofenac Sodium (VOLTAREN) 1 % GEL [id number]  Apply 2 g topically 4 (four) times daily as needed. Leamon Arnt, MD  Active   diltiazem [facility]) 180 MG 24 hr capsule [id number] Yes Take 1 capsule (180 mg total) by mouth daily. Leamon Arnt, MD  Active    fenofibrate (TRICOR) 48 MG tablet [id number]  TAKE 1 TABLET (48 MG TOTAL) BY MOUTH DAILY. Leamon Arnt, MD  Active   folic acid (FOLVITE) 834 MCG tablet [id number]  Take 400 mcg by mouth daily. [provider]  Active   furosemide (LASIX) 40 MG tablet [id number] Yes TAKE 1 TABLET EVERY DAY Leamon Arnt, MD  Active   lisinopril (ZESTRIL) 10 MG tablet [id number] Yes Take 1 tablet (10 mg total) by mouth daily. Leamon Arnt, MD  Active   omeprazole (PRILOSEC) 20 MG capsule [id number] Yes Take 1 capsule (20 mg total) by mouth daily. Leamon Arnt, MD  Active   Probiotic Product (ADVANCED PROBIOTIC PO) [id number] Yes Take 1 capsule by mouth daily. [provider]  Active   rosuvastatin (CRESTOR) 40 MG tablet [id number] Yes Take 1 tablet (40 mg total) by mouth at bedtime. Leamon Arnt, MD  Active  Specialty Vitamins Products (ONE-A-DAY BONE STRENGTH PO) [id number] Yes Take 1 tablet by mouth daily.  [provider]  Active   triamcinolone cream (KENALOG) 0.1 % [id number]  Apply 1 application topically daily as needed. Warren Danes, [place]  Active             Patient Active Problem List   Diagnosis Date Noted   Hepatic steatosis 07/28/2020   OSA (obstructive sleep apnea) 05/26/2020   GERD (gastroesophageal reflux disease) 10/02/2018   Essential hypertension 10/02/2018   Mixed hyperlipidemia 10/02/2018   Osteopenia 10/02/2018   Primary osteoarthritis involving multiple joints 10/02/2018   Pulmonary hypertension, primary ([place]) 10/02/2018   History of melanoma 10/02/2018   Polyp of colon, adenomatous 10/02/2018   Gastric polyps 10/02/2018    Immunization History  Administered Date(s) Administered   Fluad Quad(high Dose 65+) 07/16/2019   Influenza, High Dose Seasonal PF 06/04/2020   PFIZER(Purple Top)SARS-COV-2 Vaccination 09/27/2019, 10/18/2019, 07/03/2020   Pneumococcal Conjugate-13 07/18/2013   Pneumococcal Polysaccharide-23 09/11/2009   Zoster  Recombinat (Shingrix) 08/10/2012   Conditions to be addressed/monitored: Osteopenia, HLD, GERD, HTN, OSA, OA  Care Plan : ccm pharmacy care plan  Updates made by Madelin Rear, [place] since 03/01/2021 12:00 AM     Problem: Osteopenia, HLD, GERD, HTN, OSA, OA   Priority: High  Onset Date: 03/01/2021     Long-Range Goal: Disease Management   Start Date: 03/01/2021  Expected End Date: 03/01/2022  This Visit's Progress: On track  Priority: High  Note:    Pharmacist Clinical Goal(s):  Patient will contact provider office for questions/concerns as evidenced notation of same in electronic health record through collaboration with PharmD and provider.   Interventions: 1:1 collaboration with Leamon Arnt, MD regarding development and update of comprehensive plan of care as evidenced by provider attestation and co-signature Inter-disciplinary care team collaboration (see longitudinal plan of care) Comprehensive medication review performed; medication list updated in electronic medical record  Hypertension (BP goal <130/80) -Controlled -Current treatment: Lisinopril 10 mg once daily Diltiazem CD 180 mg once daily  -Current home readings: 110s-130s/diastolic 96Q-22L -Current dietary habits: minimal processed foods. White meat.  -Current exercise habits: will try to walk a couple times per week no formal exercise -Denies hypotensive/hypertensive symptoms -Educated on BP goals and benefits of medications for prevention of heart attack, stroke and kidney damage; Exercise goal of 150 minutes per week; -Counseled to monitor BP at home 1-2x every week, document, and provide log at future appointments -Recommended to continue current medication  Hyperlipidemia: (LDL goal < 70) -Controlled -Current treatment: Rosuvastatin 40 mg once daily Fenofibrate 45 mg once daily (pt is holding) -Medications previously tried: atorvastatin  -Educated on Cholesterol goals;  Exercise goal of 150 minutes per  week; -Recommended to continue current medication -Requesting lipid panel due to stopping medication   Osteopenia (Goal minimize symptoms ) -Controlled -Last DEXA Scan: 11/2019 - osteopenia   -Patient is not a candidate for pharmacologic treatment -Current treatment  Vitamin d3 2000 units once daily Bone builder supplement  -Recommend weight-bearing and muscle strengthening exercises for building and maintaining bone density. -Recommended to continue current medication  GERD (Goal: minimize symptoms ) -Controlled -Reports history of stricture  -Ongoing use of celebrex 100 mg once daily -Current treatment  Omeprazole 20 mg once daily -Recommended to continue current medication  Patient Goals/Self-Care Activities Patient will:  - take medications as prescribed target a minimum of 150 minutes of moderate intensity exercise weekly  Follow Up Plan: RPH f/u 6 months  Medication Assistance: None required.  Patient affirms current coverage meets needs.  Patient's preferred pharmacy is:  [facility] ([facility] - [place], [place] [address] Phone: [phone] Fax: [phone] [address]ARMACY [id number], [place] [place] [address] Phone: [phone] Fax: [phone]  Follow Up:  Patient agrees to Care Plan and Follow-up.  Plan: [place] f/u 6 months  CPA BP call 3 months    Current Barriers:  Unable to maintain control of HLD  Future Appointments  Date Time Provider [place]  03/09/2021  9:45 AM Warren Danes, PA-C CD-GSO [facility]  04/12/2021 10:15 AM MC-CV CH ECHO 3 [facility]SITE3ECHO [facility]  05/11/2021  8:00 AM Sueanne Margarita, MD CVD-CHUSTOFF [facility]  07/27/2021 11:40 AM GI-BCG DIAG TOMO 1 GI-BCGMM GI-BREAST CE  08/12/2021  9:00 AM Leamon Arnt, MD [facility]PC PEC  08/24/2021  9:00 AM [facility] CCM PHARMACIST  [facility] PEC  09/27/2021 10:15 AM [facility] HEALTH COACH [facility] PEC   Madelin Rear, PharmD, CPP Clinical Pharmacist Practitioner  [facility]  [phone]

## 2021-03-03 NOTE — Addendum Note (Signed)
Addended by: Madelin Rear on: 03/03/2021 11:58 AM   Modules accepted: Orders

## 2021-03-09 ENCOUNTER — Other Ambulatory Visit: Payer: Self-pay

## 2021-03-09 ENCOUNTER — Ambulatory Visit (INDEPENDENT_AMBULATORY_CARE_PROVIDER_SITE_OTHER): Payer: Medicare Other | Admitting: Physician Assistant

## 2021-03-09 ENCOUNTER — Encounter: Payer: Self-pay | Admitting: Physician Assistant

## 2021-03-09 ENCOUNTER — Other Ambulatory Visit (INDEPENDENT_AMBULATORY_CARE_PROVIDER_SITE_OTHER): Payer: Medicare Other

## 2021-03-09 DIAGNOSIS — E782 Mixed hyperlipidemia: Secondary | ICD-10-CM

## 2021-03-09 DIAGNOSIS — Z1283 Encounter for screening for malignant neoplasm of skin: Secondary | ICD-10-CM | POA: Diagnosis not present

## 2021-03-09 DIAGNOSIS — D485 Neoplasm of uncertain behavior of skin: Secondary | ICD-10-CM

## 2021-03-09 DIAGNOSIS — Z85820 Personal history of malignant melanoma of skin: Secondary | ICD-10-CM | POA: Diagnosis not present

## 2021-03-09 DIAGNOSIS — Z85828 Personal history of other malignant neoplasm of skin: Secondary | ICD-10-CM | POA: Diagnosis not present

## 2021-03-09 DIAGNOSIS — D2272 Melanocytic nevi of left lower limb, including hip: Secondary | ICD-10-CM

## 2021-03-09 DIAGNOSIS — Z86018 Personal history of other benign neoplasm: Secondary | ICD-10-CM

## 2021-03-09 NOTE — Progress Notes (Addendum)
   Follow-Up Visit   Subjective  Madison Clements is a 75 y.o. female who presents for the following: Annual Exam (Patient here today for yearly skin check, no concerns. Personal history of atypical moles, melanoma and non mole skin cancer. No family history of atypical moles, melanoma or non mole skin cancer. ).   The following portions of the chart were reviewed this encounter and updated as appropriate:  Tobacco  Allergies  Meds  Problems  Med Hx  Surg Hx  Fam Hx       Objective  Well appearing patient in no apparent distress; mood and affect are within normal limits.  A full examination was performed including scalp, head, eyes, ears, nose, lips, neck, chest, axillae, abdomen, back, buttocks, bilateral upper extremities, bilateral lower extremities, hands, feet, fingers, toes, fingernails, and toenails. All findings within normal limits unless otherwise noted below.  left outer hip     Left Lower Leg - Anterior     Assessment & Plan  Neoplasm of uncertain behavior of skin (2) left outer hip  Skin / nail biopsy Type of biopsy: tangential   Informed consent: discussed and consent obtained   Timeout: patient name, date of birth, surgical site, and procedure verified   Anesthesia: the lesion was anesthetized in a standard fashion   Anesthetic:  1% lidocaine w/ epinephrine 1-100,000 local infiltration Instrument used: flexible razor blade   Hemostasis achieved with: ferric subsulfate   Outcome: patient tolerated procedure well   Post-procedure details: wound care instructions given    Specimen 1 - Surgical pathology Differential Diagnosis: atypia  Check Margins: yes  Left Lower Leg - Anterior  Skin / nail biopsy Type of biopsy: tangential   Informed consent: discussed and consent obtained   Timeout: patient name, date of birth, surgical site, and procedure verified   Anesthesia: the lesion was anesthetized in a standard fashion   Anesthetic:  1% lidocaine w/  epinephrine 1-100,000 local infiltration Instrument used: flexible razor blade   Hemostasis achieved with: ferric subsulfate   Outcome: patient tolerated procedure well   Post-procedure details: wound care instructions given    Specimen 2 - Surgical pathology Differential Diagnosis: atypia  Check Margins: yes   I, Gasper Hopes, PA-C, have reviewed all documentation's for this visit.  The documentation on 03/23/21 for the exam, diagnosis, procedures and orders are all accurate and complete.

## 2021-03-10 ENCOUNTER — Telehealth: Payer: Self-pay

## 2021-03-10 LAB — LIPID PANEL
Cholesterol: 143 mg/dL (ref <200)
HDL: 66 mg/dL (ref >=50)
LDL Cholesterol (Calc): 55 mg/dL (calc)
Non-HDL Cholesterol (Calc): 77 mg/dL (calc) (ref <130)
Total CHOL/HDL Ratio: 2.2 (calc) (ref <5.0)
Triglycerides: 135 mg/dL (ref <150)

## 2021-03-10 NOTE — Telephone Encounter (Signed)
error 

## 2021-03-18 ENCOUNTER — Telehealth: Payer: Self-pay | Admitting: Physician Assistant

## 2021-03-18 NOTE — Telephone Encounter (Signed)
Phone call to patient to inform her that her results are back but Madison Clements is out of the office this week.  I informed patient that Madison Clements will be back next week and once she reviews her results we will give her a call. Patient aware.

## 2021-03-18 NOTE — Telephone Encounter (Signed)
Patient is calling for pathology results from last visit with [facility], PA-C.  Patient says she has gotten notification that results are back, but she cannot see them yet.

## 2021-03-25 ENCOUNTER — Telehealth: Payer: Self-pay | Admitting: *Deleted

## 2021-03-25 NOTE — Telephone Encounter (Signed)
Path to patient. No follow unless it recures.

## 2021-04-12 ENCOUNTER — Other Ambulatory Visit: Payer: Self-pay

## 2021-04-12 ENCOUNTER — Ambulatory Visit (HOSPITAL_COMMUNITY): Payer: Medicare Other | Attending: Internal Medicine

## 2021-04-12 DIAGNOSIS — I270 Primary pulmonary hypertension: Secondary | ICD-10-CM | POA: Diagnosis not present

## 2021-04-12 LAB — ECHOCARDIOGRAM COMPLETE
Area-P 1/2: 3.93 cm2
S' Lateral: 1.9 cm

## 2021-04-20 ENCOUNTER — Telehealth: Payer: Self-pay | Admitting: Pharmacist

## 2021-04-20 NOTE — Chronic Care Management (AMB) (Addendum)
Chronic Care Management Pharmacy Assistant   Name: Madison Clements  MRN: [id number] DOB: 1945-12-10  Reason for Encounter: Hypertension Disease State Call    Recent office visits:  None  Recent consult visits:  None  Hospital visits:  None in previous 6 months  Medications: Outpatient Encounter Medications as of 04/20/2021  Medication Sig   celecoxib (CELEBREX) 100 MG capsule Take 1 capsule (100 mg total) by mouth daily.   cyclobenzaprine (FLEXERIL) 10 MG tablet Take 0.5-1 tablets (5-10 mg total) by mouth at bedtime as needed for muscle spasms.   diclofenac Sodium (VOLTAREN) 1 % GEL Apply 2 g topically 4 (four) times daily as needed.   diltiazem (TIAZAC) 180 MG 24 hr capsule Take 1 capsule (180 mg total) by mouth daily.   fenofibrate (TRICOR) 48 MG tablet TAKE 1 TABLET (48 MG TOTAL) BY MOUTH DAILY.   folic acid (FOLVITE) A999333 MCG tablet Take 400 mcg by mouth daily.   furosemide (LASIX) 40 MG tablet TAKE 1 TABLET EVERY DAY   lisinopril (ZESTRIL) 10 MG tablet Take 1 tablet (10 mg total) by mouth daily.   omeprazole (PRILOSEC) 20 MG capsule Take 1 capsule (20 mg total) by mouth daily.   Probiotic Product (ADVANCED PROBIOTIC PO) Take 1 capsule by mouth daily.   rosuvastatin (CRESTOR) 40 MG tablet Take 1 tablet (40 mg total) by mouth at bedtime.   Specialty Vitamins Products (ONE-A-DAY BONE STRENGTH PO) Take 1 tablet by mouth daily.    triamcinolone cream (KENALOG) 0.1 % Apply 1 application topically daily as needed.   No facility-administered encounter medications on file as of 04/20/2021.   Reviewed chart prior to disease state call. Spoke with patient regarding BP  Recent Office Vitals: BP Readings from Last 3 Encounters:  02/04/21 132/76  11/09/20 (!) 156/82  10/09/20 136/68   Pulse Readings from Last 3 Encounters:  02/04/21 (!) 59  11/09/20 70  10/09/20 77    Wt Readings from Last 3 Encounters:  02/04/21 147 lb 9.6 oz (67 kg)  11/09/20 148 lb 3.2 oz (67.2 kg)   10/09/20 145 lb 9.6 oz (66 kg)     Kidney Function Lab Results  Component Value Date/Time   CREATININE 1.01 (H) 07/28/2020 09:01 AM   CREATININE 1.17 01/14/2020 09:04 AM   CREATININE 0.94 07/16/2019 09:13 AM   GFR 44.95 (L) 01/14/2020 09:04 AM   GFRNONAA 54 (L) 07/28/2020 09:01 AM   GFRAA 63 07/28/2020 09:01 AM    BMP Latest Ref Rng & Units 07/28/2020 01/14/2020 07/16/2019  Glucose 65 - 99 mg/dL 98 108(H) 109(H)  BUN 7 - 25 mg/dL '15 21 18  '$ Creatinine 0.60 - 0.93 mg/dL 1.01(H) 1.17 0.94  BUN/Creat Ratio 6 - 22 (calc) 15 - -  Sodium 135 - 146 mmol/L 141 138 136  Potassium 3.5 - 5.3 mmol/L 3.7 4.2 4.0  Chloride 98 - 110 mmol/L 106 100 101  CO2 20 - 32 mmol/L '26 29 25  '$ Calcium 8.6 - 10.4 mg/dL 10.2 10.7(H) 10.1    Current antihypertensive regimen:  Lisinopril 10 mg once a day Diltiazem  How often are you checking your Blood Pressure? 3-5x per week  Current home BP readings: 120's/70's  What recent interventions/DTPs have been made by any provider to improve Blood Pressure control since last CPP Visit: no interventions or DTPs noted  Any recent hospitalizations or ED visits since last visit with CPP? No  What diet changes have been made to improve Blood Pressure Control?  Patient states  she is not currently making any diet changes.  What exercise is being done to improve your Blood Pressure Control?  Patient states she does not currently exercise.  Adherence Review: Is the patient currently on ACE/ARB medication? Yes Does the patient have >5 day gap between last estimated fill dates? Yes  Future Appointments  Date Time Provider [place]  05/11/2021  8:00 AM Sueanne Margarita, MD [facility]  07/27/2021 11:40 AM [facility] DIAG TOM[facility] 1 GI-[facility] GI-[facility]  08/12/2021  9:00 AM Leamon Arnt, MD [facility]  08/24/2021  9:00 AM [facility] CCM PHARMACIST [facility]  09/27/2021 10:15 AM [facility] HEALTH COACH [facility]  03/09/2022  9:45 AM [place],  Ronalee Red, PA-C [facility]     Star Rating Drugs: Lisinopril last filled 10/06/2020 90 DS Rosuvastatin last filled 10/06/2020 90 DS  -patient states these medications have been filled within the last 90 days.  April D Calhoun, [place] Pharmacist Assistant [phone]   10 minutes spent in review, coordination, and documentation.  Reviewed by: Beverly Milch, PharmD Clinical Pharmacist [phone]'

## 2021-05-11 ENCOUNTER — Other Ambulatory Visit: Payer: Self-pay

## 2021-05-11 ENCOUNTER — Encounter: Payer: Self-pay | Admitting: Cardiology

## 2021-05-11 ENCOUNTER — Ambulatory Visit (INDEPENDENT_AMBULATORY_CARE_PROVIDER_SITE_OTHER): Payer: Medicare Other | Admitting: Cardiology

## 2021-05-11 VITALS — BP 108/60 | HR 60 | Ht 61.0 in | Wt 152.6 lb

## 2021-05-11 DIAGNOSIS — G4733 Obstructive sleep apnea (adult) (pediatric): Secondary | ICD-10-CM | POA: Diagnosis not present

## 2021-05-11 DIAGNOSIS — I270 Primary pulmonary hypertension: Secondary | ICD-10-CM | POA: Diagnosis not present

## 2021-05-11 DIAGNOSIS — E782 Mixed hyperlipidemia: Secondary | ICD-10-CM

## 2021-05-11 DIAGNOSIS — I2510 Atherosclerotic heart disease of native coronary artery without angina pectoris: Secondary | ICD-10-CM | POA: Diagnosis not present

## 2021-05-11 DIAGNOSIS — I10 Essential (primary) hypertension: Secondary | ICD-10-CM | POA: Diagnosis not present

## 2021-05-11 MED ORDER — ROSUVASTATIN CALCIUM 40 MG PO TABS: 40.0000 mg | ORAL_TABLET | Freq: Every day | ORAL | 3 refills | Status: DC

## 2021-05-11 MED ORDER — DILTIAZEM HCL ER BEADS 180 MG PO CP24: 180.0000 mg | ORAL_CAPSULE | Freq: Every day | ORAL | 3 refills | Status: DC

## 2021-05-11 MED ORDER — LISINOPRIL 10 MG PO TABS: 10.0000 mg | ORAL_TABLET | Freq: Every day | ORAL | 3 refills | Status: DC

## 2021-05-11 MED ORDER — FUROSEMIDE 40 MG PO TABS: ORAL_TABLET | ORAL | 3 refills | Status: DC

## 2021-05-11 NOTE — Addendum Note (Signed)
Addended by: Antonieta Iba on: 05/11/2021 08:44 AM   Modules accepted: Orders

## 2021-05-11 NOTE — Patient Instructions (Signed)
Medication Instructions:  Your physician has recommended you make the following change in your medication:  1) START taking an additional 20 mg of Lasix (furosemide) as needed.  *If you need a refill on your cardiac medications before your next appointment, please call your pharmacy*  Follow-Up: At [facility], you and your health needs are our priority.  As part of our continuing mission to provide you with exceptional heart care, we have created designated Provider Care Teams.  These Care Teams include your primary Cardiologist (physician) and Advanced Practice Providers (APPs -  Physician Assistants and Nurse Practitioners) who all work together to provide you with the care you need, when you need it.  Your next appointment:   1 year(s)  The format for your next appointment:   In Person  Provider:   Fransico Him, MD   Other Instructions You have been referred to see an ENT provider for a consultation for the [facility] device.

## 2021-05-11 NOTE — Progress Notes (Signed)
Cardiology Office Note:    Date:  05/11/2021   ID:  Madison, Clements September 05, 1946, MRN [id number]  PCP:  Leamon Arnt, MD  Cardiologist:  Fransico Him, MD    Referring MD: Leamon Arnt, MD   Chief Complaint  Patient presents with   Follow-up    Pulmonary HTN, HTN, CAD, HLD     History of Present Illness:    Madison Clements is a 75 y.o. female with a hx of  hyperlipidemia, GERD, hypertension and pulmonary hypertension.  She had an echocardiogram done and 2018 in [place] which showed normal LV function with the EF 55 to 123456, grade 2 diastolic dysfunction with mild to moderate TR and mild pulmonary hypertension with RVSP 39 mmHg.  Repeat 2D echo 1 year later was unchanged.  She has been on Lasix 40 mg daily.  She had a nuclear stress test done 2015 that showed no ischemia.  Cardiac catheterization done in 2016 showed mild CAD with calcified vessels.    She recently had a home sleep study done showing moderate OSA with AHI 18/hr and O2 sats as low as 72%.  She is now on CPAP by Dr. Vaughan Browner but is very frustrated with her device and has problems tolerating it and would like to be evaluated for the [facility] device.   She is here today for followup and is doing well.  She denies any chest pain or pressure, SOB, DOE, PND, orthopnea, dizziness, palpitations or syncope.   She has intermittent swelling in her legs but not everyday. She is compliant with her meds and is tolerating meds with no SE.     Past Medical History:  Diagnosis Date   Arthritis    generalized   Atypical mole 02/24/2015   RGHT LATERAL THIGH MODERATE   Atypical nevi 02/24/2015   RIGHT NECK MILD   Atypical nevi 08/25/2015   LEFT UPPER ARM MILD   Atypical nevi 08/25/2015   LEFT LATERAL FOREARM MILD/FREE   Atypical nevi 02/23/2016   LEFT THIGH MODERATE/FREE   Atypical nevi 02/23/2016   LEFT MEDIAL LEG MODERATE/FREE   Atypical nevi 02/23/2016   LEFT UPPER BACK MILD /FREE   Atypical nevi  08/23/2016   RIGHT ANT PROX THIGH MILD /FREE   Atypical nevi 07/25/2018   MID LOWER BACK MODERATE   Atypical nevi 01/22/2019   LEFT MID BACK MILD /FREE   Atypical nevi 01/22/2019   LEFT NECK MILD   Atypical nevi 08/06/2019   LEFT INNER BREAST MODERATE W/S   Atypical nevi 08/06/2019   LEFT OUTER SIDE MILD/FREE   Blood transfusion without reported diagnosis    hx of   CAD (coronary artery disease), native coronary artery 02/07/2019   Minimal CAD at the time of cath in 2016 in [place] liver 05/21/2020   Gastric polyps 10/02/2018   EGD 03/2015; benign   GERD (gastroesophageal reflux disease) 10/02/2018   on meds   History of melanoma 10/02/2018   Abdomen 2012; 2013; s/p local excisions.    Hyperlipidemia    on meds   Hypertension    on meds   Osteopenia    Polyp of colon, adenomatous 10/02/2018   Colonoscopy 03/2015; recheck in 2021   Pulmonary hypertension, primary ([place]) 10/02/2018   SCCA (squamous cell carcinoma) of skin 09/18/2017   RIGHT ANT. DISTAL LOWER LEG TREATED BY DR. Delton Coombes   Sleep apnea    Sleep apnea 05/26/2020   uses CPAP  Past Surgical History:  Procedure Laterality Date   ABDOMINAL HYSTERECTOMY  [place]   COLONOSCOPY  2016   in [place]-hx of polyps   MENISCUS REPAIR Right 2001   right knee   thumb surgery Left    TONSILLECTOMY AND ADENOIDECTOMY     tuabl ligation     WISDOM TOOTH EXTRACTION      Current Medications: Current Meds  Medication Sig   celecoxib (CELEBREX) 100 MG capsule Take 1 capsule (100 mg total) by mouth daily.   diclofenac Sodium (VOLTAREN) 1 % GEL Apply 2 g topically 4 (four) times daily as needed.   diltiazem (TIAZAC) 180 MG 24 hr capsule Take 1 capsule (180 mg total) by mouth daily.   folic acid (FOLVITE) A999333 MCG tablet Take 400 mcg by mouth daily.   furosemide (LASIX) 40 MG tablet TAKE 1 TABLET EVERY DAY   lisinopril (ZESTRIL) 10 MG tablet Take 1 tablet (10 mg  total) by mouth daily.   omeprazole (PRILOSEC) 20 MG capsule Take 1 capsule (20 mg total) by mouth daily.   Probiotic Product (ADVANCED PROBIOTIC PO) Take 1 capsule by mouth daily.   rosuvastatin (CRESTOR) 40 MG tablet Take 1 tablet (40 mg total) by mouth at bedtime.   Specialty Vitamins Products (ONE-A-DAY BONE STRENGTH PO) Take 1 tablet by mouth daily.    triamcinolone cream (KENALOG) 0.1 % Apply 1 application topically daily as needed.     Allergies:   Morphine and related   Social History   Socioeconomic History   Marital status: Widowed    Spouse name: Not on file   Number of children: Not on file   Years of education: Not on file   Highest education level: Not on file  Occupational History   Occupation: Retired [employer]  Tobacco Use   Smoking status: Former    Types: Cigarettes    Quit date: 09/12/1996    Years since quitting: 24.6   Smokeless tobacco: Never  Vaping Use   Vaping Use: Never used  Substance and Sexual Activity   Alcohol use: Yes    Comment: occassional    Drug use: Never   Sexual activity: Not on file  Other Topics Concern   Not on file  Social History Narrative   Widowed 2018 after 35 years of marriage   One daughter, 2 grandchildren. Lives in [place]   Sister lives in same complex in [place]    Previously lived in [place], [place]   Social Determinants of Health   Financial Resource Strain: Low Risk    Difficulty of Paying Living Expenses: Not hard at all  Food Insecurity: No Food Insecurity   Worried About Charity fundraiser in the Last Year: Never true   Arboriculturist in the Last Year: Never true  Transportation Needs: No Transportation Needs   Lack of Transportation (Medical): No   Lack of Transportation (Non-Medical): No  Physical Activity: Insufficiently Active   Days of Exercise per Week: 2 days   Minutes of Exercise per Session: 20 min  Stress: No Stress Concern Present   Feeling of Stress : Not at all  Social Connections: Moderately  Integrated   Frequency of Communication with Friends and Family: More than three times a week   Frequency of Social Gatherings with Friends and Family: More than three times a week   Attends Religious Services: 1 to 4 times per year   Active Member of  Clubs or Organizations: Yes   Attends Archivist Meetings: 1 to 4 times per year   Marital Status: Widowed     Family History: The patient's family history includes Arthritis in her mother, paternal grandmother, and sister; Asthma in her maternal grandfather; Breast cancer in her sister; Diabetes in her sister; Early death in her maternal grandmother; Heart attack in her father; Heart disease in her father and paternal grandmother; High Cholesterol in her sister; High blood pressure in her father, maternal grandmother, mother, paternal grandmother, and sister; Kidney disease in her maternal grandfather and paternal grandfather. There is no history of Colon polyps, Colon cancer, Esophageal cancer, Rectal cancer, or Stomach cancer.  ROS:   Please see the history of present illness.    ROS  All other systems reviewed and negative.   EKGs/Labs/Other Studies Reviewed:    The following studies were reviewed today: none  EKG:  EKG is  ordered today.  The ekg ordered today demonstrates NSR with iRBB  Recent Labs: 07/28/2020: ALT 22; BUN 15; Creat 1.01; Hemoglobin 14.5; Platelets 297; Potassium 3.7; Sodium 141; TSH 1.20   Recent Lipid Panel    Component Value Date/Time   CHOL 143 03/09/2021 0914   TRIG 135 03/09/2021 0914   HDL 66 03/09/2021 0914   CHOLHDL 2.2 03/09/2021 0914   VLDL 49.6 (H) 01/14/2020 0904   LDLCALC 55 03/09/2021 0914   LDLDIRECT 68.0 01/14/2020 0904    Physical Exam:    VS:  BP 108/60   Pulse 60   Ht '5\' 1"'$  (1.549 m)   Wt 152 lb 9.6 oz (69.2 kg)   SpO2 98%   BMI 28.83 kg/m     Wt Readings from Last 3 Encounters:  05/11/21 152 lb 9.6 oz (69.2 kg)  02/04/21 147 lb 9.6 oz (67 kg)  11/09/20 148 lb 3.2  oz (67.2 kg)    GEN: Well nourished, well developed in no acute distress HEENT: Normal NECK: No JVD; No carotid bruits LYMPHATICS: No lymphadenopathy CARDIAC:RRR, no murmurs, rubs, gallops RESPIRATORY:  Clear to auscultation without rales, wheezing or rhonchi  ABDOMEN: Soft, non-tender, non-distended MUSCULOSKELETAL:  No edema; No deformity  SKIN: Warm and dry NEUROLOGIC:  Alert and oriented x 3 PSYCHIATRIC:  Normal affect   ASSESSMENT:    1. Pulmonary hypertension, primary ([place])   2. Coronary artery disease involving native coronary artery of native heart without angina pectoris   3. Mixed hyperlipidemia   4. Essential hypertension   5. OSA (obstructive sleep apnea)    PLAN:    In order of problems listed above:  1.  Pulmonary hypertension  -moderate by echo 03/2020 with PASP 45mHg   -This is likely a combination of WHO group 2 pulmonary venous hypertension from diastolic dysfunction and Group 3 from OSA. -sleep study showed moderate OSA with AHI 18/hr and O2 sats as low as 72%>>now on CPAP followed by Pulmonary -she does no appear volume overloaded on exam today -continue CPAP therapy -Continue prescription drug management with Lasix '40mg'$  daily>refilled  -I have personally reviewed and interpreted outside labs performed by patient's PCP which showed SCr 1.01  -she was seen by Pulmonary and Dr. MVaughan Brownerrecommended [place] to increasing DOE but this was prior to getting the results of the sleep study which showed moderate OSA with hypoxia -Repeat 2D echo 8/22 showed normal LVF with PASP 3110mg   2.  Minimal CAD  -[facility]  -Nuclear stress test 03/2020  for coronary artery calcifications noted on chest CT year was normal.   -she has not had any anginal sx -continue ASA 81 mg daily and Crestor '40mg'$  daily   3.  Hyperlipidemia  -LDL goal < 70 -I have personally reviewed and interpreted outside labs performed by patient's PCP which showed  LDL 55, HDL 66, TAGs 135 and ALT 22 on Nov 2021  -Continue prescription drug management with Lipitor '40mg'$  daily   4.  Hypertension  -BP is adequately controlled on exam today -Continue prescription drug management with Cardizem CD '180mg'$  daily and Lisinopril '10mg'$  daily  -I have personally reviewed and interpreted outside SCr 1.01 and K+ 3.7 in Nov 2021  5.  OSA -she has moderate OSA with an AHI of 17.9/hr on HST and is on CPAP -she is not doing well with her CPAP and is very frustrated -she would like to be evaluated for the [facility] device and refer to ENT   Time Spent: 25 minutes total time of encounter, including 15 minutes spent in face-to-face patient care on the date of this encounter. This time includes coordination of care and counseling regarding above mentioned problem list. Remainder of non-face-to-face time involved reviewing chart documents/testing relevant to the patient encounter and documentation in the medical record. I have independently reviewed documentation from referring provider  Medication Adjustments/Labs and Tests Ordered: Current medicines are reviewed at length with the patient today.  Concerns regarding medicines are outlined above.  Orders Placed This Encounter  Procedures   EKG 12-Lead    No orders of the defined types were placed in this encounter.   Signed, Fransico Him, MD  05/11/2021 8:30 AM    [place]

## 2021-05-11 NOTE — Progress Notes (Signed)
2

## 2021-05-13 ENCOUNTER — Other Ambulatory Visit: Payer: Self-pay | Admitting: Family Medicine

## 2021-06-15 ENCOUNTER — Telehealth: Payer: Self-pay | Admitting: Pharmacist

## 2021-06-15 NOTE — Chronic Care Management (AMB) (Signed)
Chronic Care Management Pharmacy Assistant   Name: Madison Clements  MRN: [id number] DOB: 1946-03-24   Reason for Encounter: Hypertension Adherence Call    Recent office visits:  None  Recent consult visits:  None  Hospital visits:  None in previous 6 months  Medications: Outpatient Encounter Medications as of 06/15/2021  Medication Sig   celecoxib (CELEBREX) 100 MG capsule TAKE 1 CAPSULE (100 MG TOTAL) BY MOUTH DAILY.   cyclobenzaprine (FLEXERIL) 10 MG tablet Take 0.5-1 tablets (5-10 mg total) by mouth at bedtime as needed for muscle spasms.   diclofenac Sodium (VOLTAREN) 1 % GEL Apply 2 g topically 4 (four) times daily as needed.   diltiazem (TIAZAC) 180 MG 24 hr capsule Take 1 capsule (180 mg total) by mouth daily.   fenofibrate (TRICOR) 48 MG tablet TAKE 1 TABLET (48 MG TOTAL) BY MOUTH DAILY.   folic acid (FOLVITE) 409 MCG tablet Take 400 mcg by mouth daily.   furosemide (LASIX) 40 MG tablet Take 1 tablet (40 mg total) daily. Take an extra 1/2 tablet (20 mg total) as needed.   lisinopril (ZESTRIL) 10 MG tablet Take 1 tablet (10 mg total) by mouth daily.   omeprazole (PRILOSEC) 20 MG capsule TAKE 1 CAPSULE (20 MG TOTAL) BY MOUTH DAILY.   Probiotic Product (ADVANCED PROBIOTIC PO) Take 1 capsule by mouth daily.   rosuvastatin (CRESTOR) 40 MG tablet Take 1 tablet (40 mg total) by mouth at bedtime.   Specialty Vitamins Products (ONE-A-DAY BONE STRENGTH PO) Take 1 tablet by mouth daily.    triamcinolone cream (KENALOG) 0.1 % Apply 1 application topically daily as needed.   No facility-administered encounter medications on file as of 06/15/2021.   Reviewed chart prior to disease state call. Spoke with patient regarding BP  Recent Office Vitals: BP Readings from Last 3 Encounters:  05/11/21 108/60  02/04/21 132/76  11/09/20 (!) 156/82   Pulse Readings from Last 3 Encounters:  05/11/21 60  02/04/21 (!) 59  11/09/20 70    Wt Readings from Last 3 Encounters:   05/11/21 152 lb 9.6 oz (69.2 kg)  02/04/21 147 lb 9.6 oz (67 kg)  11/09/20 148 lb 3.2 oz (67.2 kg)     Kidney Function Lab Results  Component Value Date/Time   CREATININE 1.01 (H) 07/28/2020 09:01 AM   CREATININE 1.17 01/14/2020 09:04 AM   CREATININE 0.94 07/16/2019 09:13 AM   GFR 44.95 (L) 01/14/2020 09:04 AM   GFRNONAA 54 (L) 07/28/2020 09:01 AM   GFRAA 63 07/28/2020 09:01 AM    BMP Latest Ref Rng & Units 07/28/2020 01/14/2020 07/16/2019  Glucose 65 - 99 mg/dL 98 108(H) 109(H)  BUN 7 - 25 mg/dL 15 21 18   Creatinine 0.60 - 0.93 mg/dL 1.01(H) 1.17 0.94  BUN/Creat Ratio 6 - 22 (calc) 15 - -  Sodium 135 - 146 mmol/L 141 138 136  Potassium 3.5 - 5.3 mmol/L 3.7 4.2 4.0  Chloride 98 - 110 mmol/L 106 100 101  CO2 20 - 32 mmol/L 26 29 25   Calcium 8.6 - 10.4 mg/dL 10.2 10.7(H) 10.1    Current antihypertensive regimen:  Furosemide 40 mg daily Dilitazem 180 mg daily Lisinopril 10 mg daily  How often are you checking your Blood Pressure? daily  Current home BP readings: <140/90 - patient did not have any readings to report. She ensures no readings above 140/90  What recent interventions/DTPs have been made by any provider to improve Blood Pressure control since last CPP Visit: No interventions or  DTPs.  Any recent hospitalizations or ED visits since last visit with CPP? No  What diet changes have been made to improve Blood Pressure Control?  Patient states she has not made any diet changes. She states she eats healthy.  What exercise is being done to improve your Blood Pressure Control?  Patient states she likes to walk occasionally. She also states she stays very active throughout the day.  Adherence Review: Is the patient currently on ACE/ARB medication? Yes Does the patient have >5 day gap between last estimated fill dates? No   Care Gaps: Medicare Annual Wellness: AWV scheduled on 09/27/2021 Hemoglobin A1C: 5.5% on 07/27/2018 Colonoscopy: Next due on 08/14/2025 Dexa  Scan: Next due on 11/14/2021 Mammogram: Next due on 01/19/2022  Future Appointments  Date Time Provider [place]  07/27/2021 11:40 AM [facility] DIAG TOMO 1 [facility] GI[facility]AST [facility]  08/04/2021  3:30 PM Leamon Arnt, MD [facility]  08/24/2021  9:00 AM [facility] CCM PHARMACIST [facility]  09/27/2021 10:15 AM [facility] HEALTH COACH [facility]  03/09/2022  9:45 AM Warren Danes, PA-C [facility]     Star Rating Drugs: Lisinopril last filled 03/01/2021 90 DS Rosuvastatin last filled 03/01/2021 90 DS  April D Calhoun, [place] Pharmacist Assistant [phone]

## 2021-07-09 ENCOUNTER — Other Ambulatory Visit: Payer: Self-pay

## 2021-07-09 ENCOUNTER — Ambulatory Visit (INDEPENDENT_AMBULATORY_CARE_PROVIDER_SITE_OTHER): Payer: Medicare Other | Admitting: Family Medicine

## 2021-07-09 ENCOUNTER — Encounter: Payer: Self-pay | Admitting: Family Medicine

## 2021-07-09 VITALS — BP 140/82 | HR 60 | Temp 97.6°F | Ht 61.0 in | Wt 150.0 lb

## 2021-07-09 DIAGNOSIS — E782 Mixed hyperlipidemia: Secondary | ICD-10-CM | POA: Diagnosis not present

## 2021-07-09 DIAGNOSIS — M7061 Trochanteric bursitis, right hip: Secondary | ICD-10-CM | POA: Diagnosis not present

## 2021-07-09 DIAGNOSIS — I10 Essential (primary) hypertension: Secondary | ICD-10-CM | POA: Diagnosis not present

## 2021-07-09 DIAGNOSIS — M159 Polyosteoarthritis, unspecified: Secondary | ICD-10-CM | POA: Diagnosis not present

## 2021-07-09 DIAGNOSIS — D126 Benign neoplasm of colon, unspecified: Secondary | ICD-10-CM | POA: Diagnosis not present

## 2021-07-09 DIAGNOSIS — M7062 Trochanteric bursitis, left hip: Secondary | ICD-10-CM

## 2021-07-09 DIAGNOSIS — M8580 Other specified disorders of bone density and structure, unspecified site: Secondary | ICD-10-CM

## 2021-07-09 DIAGNOSIS — I270 Primary pulmonary hypertension: Secondary | ICD-10-CM | POA: Diagnosis not present

## 2021-07-09 DIAGNOSIS — Z23 Encounter for immunization: Secondary | ICD-10-CM

## 2021-07-09 DIAGNOSIS — K760 Fatty (change of) liver, not elsewhere classified: Secondary | ICD-10-CM

## 2021-07-09 DIAGNOSIS — G4733 Obstructive sleep apnea (adult) (pediatric): Secondary | ICD-10-CM

## 2021-07-09 LAB — COMPREHENSIVE METABOLIC PANEL
ALT: 21 U/L (ref 0–35)
AST: 25 U/L (ref 0–37)
Albumin: 4.1 g/dL (ref 3.5–5.2)
Alkaline Phosphatase: 82 U/L (ref 39–117)
BUN: 17 mg/dL (ref 6–23)
CO2: 30 mEq/L (ref 19–32)
Calcium: 10.2 mg/dL (ref 8.4–10.5)
Chloride: 99 mEq/L (ref 96–112)
Creatinine, Ser: 0.93 mg/dL (ref 0.40–1.20)
GFR: 59.2 mL/min — ABNORMAL LOW (ref >60.00)
Glucose, Bld: 91 mg/dL (ref 70–99)
Potassium: 4.3 mEq/L (ref 3.5–5.1)
Sodium: 137 mEq/L (ref 135–145)
Total Bilirubin: 1.1 mg/dL (ref 0.2–1.2)
Total Protein: 6.8 g/dL (ref 6.0–8.3)

## 2021-07-09 LAB — LIPID PANEL
Cholesterol: 141 mg/dL (ref 0–200)
HDL: 48.2 mg/dL (ref >39.00)
LDL Cholesterol: 66 mg/dL (ref 0–99)
NonHDL: 93.03
Total CHOL/HDL Ratio: 3
Triglycerides: 135 mg/dL (ref 0.0–149.0)
VLDL: 27 mg/dL (ref 0.0–40.0)

## 2021-07-09 LAB — CBC WITH DIFFERENTIAL/PLATELET
Basophils Absolute: 0 10*3/uL (ref 0.0–0.1)
Basophils Relative: 0.8 % (ref 0.0–3.0)
Eosinophils Absolute: 0.1 10*3/uL (ref 0.0–0.7)
Eosinophils Relative: 1.5 % (ref 0.0–5.0)
HCT: 41.1 % (ref 36.0–46.0)
Hemoglobin: 14.2 g/dL (ref 12.0–15.0)
Lymphocytes Relative: 30.2 % (ref 12.0–46.0)
Lymphs Abs: 1.5 10*3/uL (ref 0.7–4.0)
MCHC: 34.6 g/dL (ref 30.0–36.0)
MCV: 87.7 fl (ref 78.0–100.0)
Monocytes Absolute: 0.5 10*3/uL (ref 0.1–1.0)
Monocytes Relative: 9 % (ref 3.0–12.0)
Neutro Abs: 3 10*3/uL (ref 1.4–7.7)
Neutrophils Relative %: 58.5 % (ref 43.0–77.0)
Platelets: 277 10*3/uL (ref 150.0–400.0)
RBC: 4.69 Mil/uL (ref 3.87–5.11)
RDW: 12.5 % (ref 11.5–15.5)
WBC: 5.1 10*3/uL (ref 4.0–10.5)

## 2021-07-09 LAB — TSH: TSH: 1.35 u[IU]/mL (ref 0.35–5.50)

## 2021-07-09 MED ORDER — SHINGRIX 50 MCG/0.5ML IM SUSR: 0.5000 mL | Freq: Once | INTRAMUSCULAR | 0 refills | Status: AC

## 2021-07-09 MED ORDER — PREDNISONE 10 MG PO TABS: ORAL_TABLET | ORAL | 0 refills | Status: DC

## 2021-07-09 MED ORDER — DICLOFENAC SODIUM 1 % EX GEL: 2.0000 g | Freq: Four times a day (QID) | CUTANEOUS | 5 refills | Status: DC | PRN

## 2021-07-09 NOTE — Patient Instructions (Addendum)
Please return in 6 months for hypertension follow up.   I will release your lab results to you on your MyChart account with further instructions. Please reply with any questions.    Please take the prescription for Shingrix to the pharmacy so they may administer the vaccinations when you are ready. Your insurance will then cover the injections.   If you have any questions or concerns, please don't hesitate to send me a message via MyChart or call the office at [phone]. Thank you for visiting with Madison Clements today! It's our pleasure caring for you.   Preventive Care 77 Years and Older, Female Preventive care refers to lifestyle choices and visits with your health care provider that can promote health and wellness. This includes: A yearly physical exam. This is also called an annual wellness visit. Regular dental and eye exams. Immunizations. Screening for certain conditions. Healthy lifestyle choices, such as: Eating a healthy diet. Getting regular exercise. Not using drugs or products that contain nicotine and tobacco. Limiting alcohol use. What can I expect for my preventive care visit? Physical exam Your health care provider will check your: Height and weight. These may be used to calculate your BMI (body mass index). BMI is a measurement that tells if you are at a healthy weight. Heart rate and blood pressure. Body temperature. Skin for abnormal spots. Counseling Your health care provider may ask you questions about your: Past medical problems. Family's medical history. Alcohol, tobacco, and drug use. Emotional well-being. Home life and relationship well-being. Sexual activity. Diet, exercise, and sleep habits. History of falls. Memory and ability to understand (cognition). Work and work Statistician. Pregnancy and menstrual history. Access to firearms. What immunizations do I need? Vaccines are usually given at various ages, according to a schedule. Your health care provider  will recommend vaccines for you based on your age, medical history, and lifestyle or other factors, such as travel or where you work. What tests do I need? Blood tests Lipid and cholesterol levels. These may be checked every 5 years, or more often depending on your overall health. Hepatitis C test. Hepatitis B test. Screening Lung cancer screening. You may have this screening every year starting at age 53 if you have a 30-pack-year history of smoking and currently smoke or have quit within the past 15 years. Colorectal cancer screening. All adults should have this screening starting at age 85 and continuing until age 3. Your health care provider may recommend screening at age 81 if you are at increased risk. You will have tests every 1-10 years, depending on your results and the type of screening test. Diabetes screening. This is done by checking your blood sugar (glucose) after you have not eaten for a while (fasting). You may have this done every 1-3 years. Mammogram. This may be done every 1-2 years. Talk with your health care provider about how often you should have regular mammograms. Abdominal aortic aneurysm (AAA) screening. You may need this if you are a current or former smoker. BRCA-related cancer screening. This may be done if you have a family history of breast, ovarian, tubal, or peritoneal cancers. Other tests STD (sexually transmitted disease) testing, if you are at risk. Bone density scan. This is done to screen for osteoporosis. You may have this done starting at age 86. Talk with your health care provider about your test results, treatment options, and if necessary, the need for more tests. Follow these instructions at home: Eating and drinking  Eat a diet that includes  fresh fruits and vegetables, whole grains, lean protein, and low-fat dairy products. Limit your intake of foods with high amounts of sugar, saturated fats, and salt. Take vitamin and mineral supplements  as recommended by your health care provider. Do not drink alcohol if your health care provider tells you not to drink. If you drink alcohol: Limit how much you have to 0-1 drink a day. Be aware of how much alcohol is in your drink. In the [place] one drink equals one 12 oz bottle of beer (355 mL), one 5 oz glass of wine (148 mL), or one 1 oz glass of hard liquor (44 mL). Lifestyle Take daily care of your teeth and gums. Brush your teeth every morning and night with fluoride toothpaste. Floss one time each day. Stay active. Exercise for at least 30 minutes 5 or more days each week. Do not use any products that contain nicotine or tobacco, such as cigarettes, e-cigarettes, and chewing tobacco. If you need help quitting, ask your health care provider. Do not use drugs. If you are sexually active, practice safe sex. Use a condom or other form of protection in order to prevent STIs (sexually transmitted infections). Talk with your health care provider about taking a low-dose aspirin or statin. Find healthy ways to cope with stress, such as: Meditation, yoga, or listening to music. Journaling. Talking to a trusted person. Spending time with friends and family. Safety Always wear your seat belt while driving or riding in a vehicle. Do not drive: If you have been drinking alcohol. Do not ride with someone who has been drinking. When you are tired or distracted. While texting. Wear a helmet and other protective equipment during sports activities. If you have firearms in your house, make sure you follow all gun safety procedures. What's next? Visit your health care provider once a year for an annual wellness visit. Ask your health care provider how often you should have your eyes and teeth checked. Stay up to date on all vaccines. This information is not intended to replace advice given to you by your health care provider. Make sure you discuss any questions you have with your health care  provider. Document Revised: 11/06/2020 Document Reviewed: 08/23/2018 Elsevier Patient Education  2022 [employer].

## 2021-07-09 NOTE — Progress Notes (Signed)
Subjective  Chief Complaint  Patient presents with   Hypertension   Hyperlipidemia   Gastroesophageal Reflux    HPI: Madison Clements is a 75 y.o. female who presents to [facility] [place] today for a Female Wellness Visit. She also has the concerns and/or needs as listed above in the chief complaint. These will be addressed in addition to the Health Maintenance Visit.   Wellness Visit: annual visit with health maintenance review and exam without Pap  Health maintenance: Screens are up-to-date.  Bone density due next year.  To follow-up osteopenia.  Eligible for Shingrix.  She would like to defer until next year.  Flu shot today.  Would like to get new COVID booster.  Overall doing well Health Maintenance  Topic Date Due   Zoster Vaccines- Shingrix (1 of 2) Never done   COVID-19 Vaccine (4 - Booster for Pfizer series) 08/28/2020   DEXA SCAN  11/14/2021   MAMMOGRAM  01/19/2022   COLONOSCOPY (Pts 45-19yrs Insurance coverage will need to be confirmed)  08/14/2025   Pneumonia Vaccine 31+ Years old  Completed   INFLUENZA VACCINE  Completed   Hepatitis C Screening  Completed   HPV VACCINES  Aged Out    Chronic disease f/u and/or acute problem visit: (deemed necessary to be done in addition to the wellness visit): Osteoarthritis multiple joints: Complains of right shoulder pain, knee pain and bilateral hip pain.  Has history of hip bursitis.  Uses Voltaren gel on hands and knees.  Pains come and go.  She has seen orthopedic for her knees and has end-stage osteoarthritis.  She is deferring replacements.  No red hot swollen joints currently.  Has had steroid injections in the hips which have been helpful Hypertension, pulmonary hypertension, sleep apnea and hyperlipidemia have all been well controlled.  Reviewed recent cardiology notes.  She is going to see ENT soon to see if she will qualify for the new Inspira device for sleep apnea.  She finds CPAP disruptive to her  sleep.  However her echocardiogram has improved.  Blood pressures are well controlled by home readings.  No chest pain or shortness of breath Reviewed all medications in detail.  Current dosages listed are correct and appropriate  Assessment  1. Essential hypertension   2. Need for immunization against influenza   3. Mixed hyperlipidemia   4. Pulmonary hypertension, primary ([place])   5. OSA (obstructive sleep apnea)   6. Osteopenia, unspecified location   7. Hepatic steatosis   8. Adenomatous polyp of colon, unspecified part of colon   9. Trochanteric bursitis of both hips   10. Primary osteoarthritis involving multiple joints      Plan  Female Wellness Visit: Age appropriate Health Maintenance and Prevention measures were discussed with patient. Included topics are cancer screening recommendations, ways to keep healthy (see AVS) including dietary and exercise recommendations, regular eye and dental care, use of seat belts, and avoidance of moderate alcohol use and tobacco use.  Screens are current BMI: discussed patient's BMI and encouraged positive lifestyle modifications to help get to or maintain a target BMI. HM needs and immunizations were addressed and ordered. See below for orders. See HM and immunization section for updates.  Flu shot today Routine labs and screening tests ordered including cmp, cbc and lipids where appropriate. Discussed recommendations regarding Vit D and calcium supplementation (see AVS)  Chronic disease management visit and/or acute problem visit: Hypertension, hyperlipidemia sleep apnea are well well controlled.  Continue current  medications as listed.  She remains on Cardizem Lasix and lisinopril for blood pressure.  Using Lasix for lower extremity edema. Hip bursitis: Gave handout on stretching exercises.  Ice.  Return for steroid injection if needed. End-stage changes of osteoarthritis: Celebrex daily. Right shoulder pain and left carpal tunnel: Trial of  oral prednisone.  Follow-up if needed Monitoring liver enzymes  Follow up: Return in about 6 months (around 01/07/2022) for follow up Hypertension, recheck.  Orders Placed This Encounter  Procedures   Flu Vaccine QUAD High Dose(Fluad)   CBC with Differential/Platelet   Comprehensive metabolic panel   Lipid panel   TSH   Meds ordered this encounter  Medications   diclofenac Sodium (VOLTAREN) 1 % GEL    Sig: Apply 2 g topically 4 (four) times daily as needed.    Dispense:  350 g    Refill:  5   predniSONE (DELTASONE) 10 MG tablet    Sig: Take 4 tabs qd x 2 days, 3 qd x 2 days, 2 qd x 2d, 1qd x 3 days    Dispense:  21 tablet    Refill:  0   Zoster Vaccine Adjuvanted [facility]) injection    Sig: Inject 0.5 mLs into the muscle once for 1 dose. Please give 2nd dose 2-6 months after first dose    Dispense:  2 each    Refill:  0      Body mass index is 28.34 kg/m. Wt Readings from Last 3 Encounters:  07/09/21 150 lb (68 kg)  05/11/21 152 lb 9.6 oz (69.2 kg)  02/04/21 147 lb 9.6 oz (67 kg)     Patient Active Problem List   Diagnosis Date Noted   OSA (obstructive sleep apnea) 05/26/2020    Priority: 1.   Essential hypertension 10/02/2018    Priority: 1.   Mixed hyperlipidemia 10/02/2018    Priority: 1.   Pulmonary hypertension, primary ([place]) 10/02/2018    Priority: 1.    Diagnosed around 2010; has seen pulm and cards in the past.  Left heart catheterization in 2016 - minimal vascular disease Echocardiogram 02/2019 with worsening pulm htn: moderate. Mild diastolic dysfunction. Nl EF    History of melanoma 10/02/2018    Priority: 1.    Abdomen 2012; 2013; s/p local excisions.     Polyp of colon, adenomatous 10/02/2018    Priority: 1.    Colonoscopy 03/2015 and 2021    Hepatic steatosis 07/28/2020    Priority: 2.   GERD (gastroesophageal reflux disease) 10/02/2018    Priority: 2.   Osteopenia 10/02/2018    Priority: 2.    dexa 11/3019  t = -1.5 lowest, stable  osteopenia. Repeat in 2 years.  Dexa 2019: T = -1.4 lowest; recheck 2 years.     Primary osteoarthritis involving multiple joints 10/02/2018    Priority: 2.    Daily celebrex; bilateral knees, h/o right meniscal repair; has had viscosupplementation and steroid injections in the past.  Bilateral hands,no back problems    Gastric polyps 10/02/2018    Priority: 2.    EGD 03/2015; benign; done for dysphagia    Health Maintenance  Topic Date Due   Zoster Vaccines- Shingrix (1 of 2) Never done   COVID-19 Vaccine (4 - Booster for Pfizer series) 08/28/2020   DEXA SCAN  11/14/2021   MAMMOGRAM  01/19/2022   COLONOSCOPY (Pts 45-24yrs Insurance coverage will need to be confirmed)  08/14/2025   Pneumonia Vaccine 32+ Years old  Completed   INFLUENZA VACCINE  Completed   Hepatitis C Screening  Completed   HPV VACCINES  Aged Out   Immunization History  Administered Date(s) Administered   Fluad Quad(high Dose 65+) 07/16/2019, 07/09/2021   Influenza, High Dose Seasonal PF 06/04/2020   PFIZER(Purple Top)SARS-COV-2 Vaccination 09/27/2019, 10/18/2019, 07/03/2020   Pneumococcal Conjugate-13 07/18/2013   Pneumococcal Polysaccharide-23 09/11/2009   Zoster, Live 08/10/2012   We updated and reviewed the patient's past history in detail and it is documented below. Allergies: Patient is allergic to morphine and related. Past Medical History Patient  has a past medical history of Arthritis, Atypical mole (02/24/2015), Atypical nevi (02/24/2015), Atypical nevi (08/25/2015), Atypical nevi (08/25/2015), Atypical nevi (02/23/2016), Atypical nevi (02/23/2016), Atypical nevi (02/23/2016), Atypical nevi (08/23/2016), Atypical nevi (07/25/2018), Atypical nevi (01/22/2019), Atypical nevi (01/22/2019), Atypical nevi (08/06/2019), Atypical nevi (08/06/2019), Blood transfusion without reported diagnosis, CAD (coronary artery disease), native coronary artery (02/07/2019), Fatty liver (05/21/2020), Gastric polyps  (10/02/2018), GERD (gastroesophageal reflux disease) (10/02/2018), History of melanoma (10/02/2018), Hyperlipidemia, Hypertension, Osteopenia, Polyp of colon, adenomatous (10/02/2018), Pulmonary hypertension, primary ([place]) (10/02/2018), SCCA (squamous cell carcinoma) of skin (09/18/2017), Sleep apnea, and Sleep apnea (05/26/2020). Past Surgical History Patient  has a past surgical history that includes Meniscus repair (Right, 2001); Appendectomy (1962); Abdominal hysterectomy (1990); Bladder suspension (1990); thumb surgery (Left); Colonoscopy (2016); tuabl ligation; Tonsillectomy and adenoidectomy; and Wisdom tooth extraction. Family History: Patient family history includes Arthritis in her mother, paternal grandmother, and sister; Asthma in her maternal grandfather; Breast cancer in her sister; Diabetes in her sister; Early death in her maternal grandmother; Heart attack in her father; Heart disease in her father and paternal grandmother; High Cholesterol in her sister; High blood pressure in her father, maternal grandmother, mother, paternal grandmother, and sister; Kidney disease in her maternal grandfather and paternal grandfather. Social History:  Patient  reports that she quit smoking about 24 years ago. Her smoking use included cigarettes. She has never used smokeless tobacco. She reports current alcohol use. She reports that she does not use drugs.  Review of Systems: Constitutional: negative for fever or malaise Ophthalmic: negative for photophobia, double vision or loss of vision Cardiovascular: negative for chest pain, dyspnea on exertion, or new LE swelling Respiratory: negative for SOB or persistent cough Gastrointestinal: negative for abdominal pain, change in bowel habits or melena Genitourinary: negative for dysuria or gross hematuria, no abnormal uterine bleeding or disharge Musculoskeletal: negative for new gait disturbance or muscular weakness Integumentary: negative for new or  persistent rashes, no breast lumps Neurological: negative for TIA or stroke symptoms Psychiatric: negative for SI or delusions Allergic/Immunologic: negative for hives  Patient Care Team    Relationship Specialty Notifications Start End  Leamon Arnt, MD PCP - General Family Medicine  10/02/18   Sueanne Margarita, MD PCP - Cardiology Cardiology  05/10/21   Sueanne Margarita, MD Consulting Physician Cardiology  02/13/19   Hayden Pedro, MD Consulting Physician Ophthalmology  08/02/19   Warren Danes, PA-C Physician Assistant Dermatology  08/02/19   Madelin Rear, [facility] Pharmacist Pharmacist  01/20/21    Comment: Phone [phone]    Objective  Vitals: BP 140/82   Pulse 60   Temp 97.6 F (36.4 C) (Temporal)   Ht 5\' 1"  (1.549 m)   Wt 150 lb (68 kg)   SpO2 99%   BMI 28.34 kg/m  General:  Well developed, well nourished, no acute distress  Psych:  Alert and orientedx3,normal mood and affect HEENT:  Normocephalic, atraumatic, non-icteric sclera,  supple neck without adenopathy,  mass or thyromegaly Cardiovascular:  Normal S1, S2, RRR without gallop, rub or murmur Respiratory:  Good breath sounds bilaterally, CTAB with normal respiratory effort Gastrointestinal: normal bowel sounds, soft, non-tender, no noted masses. No HSM MSK: no deformities, contusions. Joints are without erythema or swelling.  Osteoarthritic changes noted in hands Skin:  Warm, no rashes or suspicious lesions noted Neurologic:    Mental status is normal. CN 2-11 are normal. Gross motor and sensory exams are normal. Normal gait. No tremor   Commons side effects, risks, benefits, and alternatives for medications and treatment plan prescribed today were discussed, and the patient expressed understanding of the given instructions. Patient is instructed to call or message via MyChart if he/she has any questions or concerns regarding our treatment plan. No barriers to understanding were identified. We discussed Red Flag  symptoms and signs in detail. Patient expressed understanding regarding what to do in case of urgent or emergency type symptoms.  Medication list was reconciled, printed and provided to the patient in AVS. Patient instructions and summary information was reviewed with the patient as documented in the AVS. This note was prepared with assistance of Dragon voice recognition software. Occasional wrong-word or sound-a-like substitutions may have occurred due to the inherent limitations of voice recognition software  This visit occurred during the SARS-CoV-2 public health emergency.  Safety protocols were in place, including screening questions prior to the visit, additional usage of staff PPE, and extensive cleaning of exam room while observing appropriate contact time as indicated for disinfecting solutions.

## 2021-07-12 DIAGNOSIS — G4733 Obstructive sleep apnea (adult) (pediatric): Secondary | ICD-10-CM | POA: Diagnosis not present

## 2021-07-12 DIAGNOSIS — J343 Hypertrophy of nasal turbinates: Secondary | ICD-10-CM | POA: Diagnosis not present

## 2021-07-12 DIAGNOSIS — J342 Deviated nasal septum: Secondary | ICD-10-CM | POA: Diagnosis not present

## 2021-07-22 ENCOUNTER — Telehealth: Payer: Self-pay

## 2021-07-22 DIAGNOSIS — G4733 Obstructive sleep apnea (adult) (pediatric): Secondary | ICD-10-CM

## 2021-07-22 NOTE — Telephone Encounter (Signed)
Notes received from Dr. Wilburn Cornelia who saw patient for a consult for [facility] device. Patient needs an in lab sleep study. Per Dr. Radford Pax need to order a PSG sleep study. Orders have been placed and message sent to sleep pool.

## 2021-07-23 ENCOUNTER — Telehealth: Payer: Self-pay | Admitting: *Deleted

## 2021-07-23 NOTE — Telephone Encounter (Signed)
-----   Message from Antonieta Iba, RN sent at 07/22/2021  3:43 PM EST ----- Regarding: PSG sleep study ASAP PSG sleep study has been ordered. Dr. Radford Pax would like it done ASAP.  Thanks!

## 2021-07-27 ENCOUNTER — Other Ambulatory Visit: Payer: Self-pay | Admitting: Cardiology

## 2021-07-27 ENCOUNTER — Other Ambulatory Visit: Payer: Self-pay

## 2021-07-27 ENCOUNTER — Ambulatory Visit
Admission: RE | Admit: 2021-07-27 | Discharge: 2021-07-27 | Disposition: A | Discharge status: Home / Self Care | Payer: Medicare Other | Source: Ambulatory Visit | Attending: Family Medicine | Admitting: Family Medicine

## 2021-07-27 ENCOUNTER — Ambulatory Visit
Admission: RE | Admit: 2021-07-27 | Discharge: 2021-07-27 | Disposition: A | Discharge status: Home / Self Care | Payer: Medicare Other | Source: Ambulatory Visit | Attending: Cardiology | Admitting: Cardiology

## 2021-07-27 DIAGNOSIS — R922 Inconclusive mammogram: Secondary | ICD-10-CM | POA: Diagnosis not present

## 2021-07-27 DIAGNOSIS — N6489 Other specified disorders of breast: Secondary | ICD-10-CM

## 2021-07-27 NOTE — Telephone Encounter (Signed)
Patient is scheduled for lab study on 09/09/21. Patient understands her sleep study will be done at [facility] sleep lab. Patient understands she will receive a sleep packet in a week or so. Patient understands to call if she does not receive the sleep packet in a timely manner. Patient agrees with treatment and thanked me for call.

## 2021-08-02 ENCOUNTER — Encounter: Payer: Self-pay | Admitting: Family Medicine

## 2021-08-02 NOTE — Progress Notes (Signed)
Reviewed. Subsequent studies are scheduled and pt aware.  Not sure how Dr. Theodosia Blender name got listed.

## 2021-08-03 MED ORDER — ALPRAZOLAM 0.5 MG PO TABS: 0.5000 mg | ORAL_TABLET | Freq: Once | ORAL | 0 refills | Status: DC

## 2021-08-04 ENCOUNTER — Encounter: Payer: Medicare Other | Admitting: Family Medicine

## 2021-08-09 ENCOUNTER — Other Ambulatory Visit: Payer: Self-pay | Admitting: Family Medicine

## 2021-08-09 DIAGNOSIS — N6489 Other specified disorders of breast: Secondary | ICD-10-CM

## 2021-08-11 ENCOUNTER — Other Ambulatory Visit: Payer: Self-pay

## 2021-08-11 ENCOUNTER — Ambulatory Visit
Admission: RE | Admit: 2021-08-11 | Discharge: 2021-08-11 | Disposition: A | Discharge status: Home / Self Care | Payer: Medicare Other | Source: Ambulatory Visit | Attending: Cardiology | Admitting: Cardiology

## 2021-08-11 DIAGNOSIS — Z170 Estrogen receptor positive status [ER+]: Secondary | ICD-10-CM | POA: Diagnosis not present

## 2021-08-11 DIAGNOSIS — N6489 Other specified disorders of breast: Secondary | ICD-10-CM

## 2021-08-11 DIAGNOSIS — C50812 Malignant neoplasm of overlapping sites of left female breast: Secondary | ICD-10-CM | POA: Diagnosis not present

## 2021-08-11 DIAGNOSIS — N6325 Unspecified lump in the left breast, overlapping quadrants: Secondary | ICD-10-CM | POA: Diagnosis not present

## 2021-08-11 DIAGNOSIS — R921 Mammographic calcification found on diagnostic imaging of breast: Secondary | ICD-10-CM | POA: Diagnosis not present

## 2021-08-11 DIAGNOSIS — C50312 Malignant neoplasm of lower-inner quadrant of left female breast: Secondary | ICD-10-CM | POA: Diagnosis not present

## 2021-08-12 ENCOUNTER — Encounter: Payer: Medicare Other | Admitting: Family Medicine

## 2021-08-12 ENCOUNTER — Other Ambulatory Visit: Payer: Self-pay | Admitting: Family Medicine

## 2021-08-12 DIAGNOSIS — C50919 Malignant neoplasm of unspecified site of unspecified female breast: Secondary | ICD-10-CM

## 2021-08-12 HISTORY — PX: BREAST LUMPECTOMY: SHX2

## 2021-08-13 ENCOUNTER — Ambulatory Visit
Admission: RE | Admit: 2021-08-13 | Discharge: 2021-08-13 | Disposition: A | Discharge status: Home / Self Care | Payer: Medicare Other | Source: Ambulatory Visit | Attending: Family Medicine | Admitting: Family Medicine

## 2021-08-13 ENCOUNTER — Other Ambulatory Visit: Payer: Self-pay

## 2021-08-13 ENCOUNTER — Encounter (HOSPITAL_BASED_OUTPATIENT_CLINIC_OR_DEPARTMENT_OTHER): Payer: Self-pay | Admitting: Cardiology

## 2021-08-13 ENCOUNTER — Telehealth: Payer: Self-pay | Admitting: Hematology

## 2021-08-13 ENCOUNTER — Encounter: Payer: Self-pay | Admitting: *Deleted

## 2021-08-13 DIAGNOSIS — R922 Inconclusive mammogram: Secondary | ICD-10-CM | POA: Diagnosis not present

## 2021-08-13 DIAGNOSIS — C50919 Malignant neoplasm of unspecified site of unspecified female breast: Secondary | ICD-10-CM

## 2021-08-13 NOTE — Telephone Encounter (Signed)
Spoke to patient to confirm afternoon clinic appointment on 12/7, packet sent via email

## 2021-08-16 ENCOUNTER — Other Ambulatory Visit: Payer: Self-pay | Admitting: *Deleted

## 2021-08-16 ENCOUNTER — Encounter: Payer: Self-pay | Admitting: *Deleted

## 2021-08-16 DIAGNOSIS — C50812 Malignant neoplasm of overlapping sites of left female breast: Secondary | ICD-10-CM

## 2021-08-16 DIAGNOSIS — Z170 Estrogen receptor positive status [ER+]: Secondary | ICD-10-CM

## 2021-08-17 NOTE — Progress Notes (Signed)
Radiation Oncology         [phone]) [phone] ________________________________  Initial Outpatient Consultation  Name: Madison Clements MRN: [id number]  Date: 08/18/2021  DOB: 1946/06/27  CC:Leamon Arnt, MD  Donnie Mesa, MD   REFERRING PHYSICIAN: Donnie Mesa, MD  DIAGNOSIS:    ICD-10-CM   1. Malignant neoplasm of overlapping sites of left breast in female, estrogen receptor positive ([place])  C50.812    Z17.0       Cancer Staging  Malignant neoplasm of overlapping sites of left breast in female, estrogen receptor positive ([place]) Staging form: Breast, AJCC 8th Edition - Clinical stage from 08/11/2021: Stage IA (cT1c, cN0, cM0, G2, ER+, PR+, HER2-) - Signed by Truitt Merle, MD on 08/18/2021 Stage prefix: Initial diagnosis Histologic grading system: 3 grade system  CHIEF COMPLAINT: Here to discuss management of left breast cancer  HISTORY OF PRESENT ILLNESS::Madison Clements is a 75 y.o. female who initially presented with a left breast abnormality on the following imaging: bilateral screening mammogram on the date of 01/19/21.  No symptoms, if any, were reported at that time.   Left breast diagnostic mammogram and ultrasound performed on 01/22/21 revealed the improved appearance of the focal asymmetry within the inner left breast. Six month follow up imaging was recommended.   Follow-up diagnostic left breast mammogram and ultrasound on the date of 07/27/21 revealed a suspicious mass in the left breast at the 9 o'clock position, measuring 1.8 cm. Additionally, a 1.0 cm group of suspicious linear calcifications were appreciated, located anterior to the suspicious mass, as well as a faint 2 mm group located 1 cm laterally from the linear calcifications. No evidence of left axillary lymphadenopathy appreciated.   Left breast biopsy at the 9 o'clock position on date of 08/11/21 showed grade 2 invasive mammary carcinoma measuring 1.2 cm in the greatest linear extent. Left breast LIQ  biopsy also performed showed grade 2 invasive mammary carcinoma measuring 0.2 cm in the greatest linear extent, with mammary carcinoma in-situ and calcifications.  No lymph nodes were examined.   ER 100% positive, PR 60% positive, both with strong staining intensity; Her2 status negative; proliferation marker Ki67 at 10%; Grade 2.   Right breast mammogram on 08/13/21 revealed no mammographic evidence of malignancy in the right breast.  She spoke with her surgeon and is going to elect to have an MRI of her breasts before lumpectomy.  No sentinel lymph node biopsy recommended by Dr. Georgette Dover  PREVIOUS RADIATION THERAPY: No  PAST MEDICAL HISTORY:  has a past medical history of Allergy (1990), Arthritis, Atypical mole (02/24/2015), Atypical nevi (02/24/2015), Atypical nevi (08/25/2015), Atypical nevi (08/25/2015), Atypical nevi (02/23/2016), Atypical nevi (02/23/2016), Atypical nevi (02/23/2016), Atypical nevi (08/23/2016), Atypical nevi (07/25/2018), Atypical nevi (01/22/2019), Atypical nevi (01/22/2019), Atypical nevi (08/06/2019), Atypical nevi (08/06/2019), Blood transfusion without reported diagnosis, Breast cancer ([place]) (2022), CAD (coronary artery disease), native coronary artery (02/07/2019), Fatty liver (05/21/2020), Gastric polyps (10/02/2018), GERD (gastroesophageal reflux disease) (10/02/2018), History of melanoma (10/02/2018), Hyperlipidemia, Hypertension, Osteopenia, Polyp of colon, adenomatous (10/02/2018), Pulmonary hypertension, primary ([place]) (10/02/2018), SCCA (squamous cell carcinoma) of skin (09/18/2017), Sleep apnea, and Sleep apnea (05/26/2020).    PAST SURGICAL HISTORY: Past Surgical History:  Procedure Laterality Date   ABDOMINAL HYSTERECTOMY  1990   APPENDECTOMY  1962   BLADDER SUSPENSION  1990   COLONOSCOPY  2016   in [place]-hx of polyps   EYE SURGERY  2013 torn retina laser   MENISCUS REPAIR Right 2001   right knee   thumb  surgery Left    TONSILLECTOMY AND ADENOIDECTOMY      tuabl ligation     TUBAL LIGATION  1975   WISDOM TOOTH EXTRACTION      FAMILY HISTORY: family history includes Arthritis in her mother, paternal grandmother, and sister; Asthma in her maternal grandfather and paternal grandfather; Breast cancer in her sister; COPD in her paternal grandfather; Cancer in her maternal aunt, maternal aunt, and sister; Diabetes in her sister; Early death in her maternal grandmother; Heart attack in her father; Heart disease in her father, maternal uncle, maternal uncle, and paternal grandmother; High Cholesterol in her sister; High blood pressure in her father, maternal grandmother, mother, paternal grandmother, and sister; Hyperlipidemia in her mother; Hypertension in her father, maternal grandmother, mother, and paternal grandmother; Kidney disease in her maternal grandfather and paternal grandfather; Pancreatic cancer in her maternal aunt; Stroke in her maternal grandmother, mother, and paternal aunt; Varicose Veins in her mother and sister.  SOCIAL HISTORY:  reports that she quit smoking about 24 years ago. Her smoking use included cigarettes. She has a 2.50 pack-year smoking history. She has never used smokeless tobacco. She reports current alcohol use of about 10.0 standard drinks per week. She reports that she does not use drugs.  ALLERGIES: Morphine and related  MEDICATIONS:  Current Outpatient Medications  Medication Sig Dispense Refill   celecoxib (CELEBREX) 100 MG capsule TAKE 1 CAPSULE (100 MG TOTAL) BY MOUTH DAILY. 90 capsule 3   diclofenac Sodium (VOLTAREN) 1 % GEL Apply 2 g topically 4 (four) times daily as needed. 350 g 5   diltiazem (TIAZAC) 180 MG 24 hr capsule Take 1 capsule (180 mg total) by mouth daily. 90 capsule 3   folic acid (FOLVITE) 433 MCG tablet Take 400 mcg by mouth daily.     furosemide (LASIX) 40 MG tablet Take 1 tablet (40 mg total) daily. Take an extra 1/2 tablet (20 mg total) as needed. 135 tablet 3   lisinopril (ZESTRIL) 10 MG tablet  Take 1 tablet (10 mg total) by mouth daily. 90 tablet 3   omeprazole (PRILOSEC) 20 MG capsule TAKE 1 CAPSULE (20 MG TOTAL) BY MOUTH DAILY. 90 capsule 3   predniSONE (DELTASONE) 10 MG tablet Take 4 tabs qd x 2 days, 3 qd x 2 days, 2 qd x 2d, 1qd x 3 days 21 tablet 0   Probiotic Product (ADVANCED PROBIOTIC PO) Take 1 capsule by mouth daily.     rosuvastatin (CRESTOR) 40 MG tablet Take 1 tablet (40 mg total) by mouth at bedtime. 90 tablet 3   Specialty Vitamins Products (ONE-A-DAY BONE STRENGTH PO) Take 1 tablet by mouth daily.      triamcinolone cream (KENALOG) 0.1 % Apply 1 application topically daily as needed. 453 g 3   No current facility-administered medications for this encounter.    REVIEW OF SYSTEMS: As above in HPI.   PHYSICAL EXAM:  vitals were not taken for this visit.   General: Alert and oriented, in no acute distress HEENT: Head is normocephalic. Extraocular movements are intact. Oropharynx is clear. Neck: Neck is supple, no palpable cervical or supraclavicular lymphadenopathy. Heart: Regular in rate and rhythm with no murmurs, rubs, or gallops. Chest: Clear to auscultation bilaterally, with no rhonchi, wheezes, or rales. Abdomen: Soft, nontender, nondistended, with no rigidity or guarding. Lymphatics: see Neck Exam Skin: No concerning lesions. Musculoskeletal: symmetric strength and muscle tone throughout. Neurologic: Cranial nerves II through XII are grossly intact. No obvious focalities. Speech is fluent. Coordination is intact.  Psychiatric: Judgment and insight are intact. Affect is appropriate. Breasts: Bruising and postbiopsy changes in the lower inner quadrant of the left breast.  The palpable thickening in the postbiopsy region extends for about 2 cm. No other palpable masses appreciated in the breasts or axillae bilaterally.    ECOG = 0  0 - Asymptomatic (Fully active, able to carry on all predisease activities without restriction)  1 - Symptomatic but completely  ambulatory (Restricted in physically strenuous activity but ambulatory and able to carry out work of a light or sedentary nature. For example, light housework, office work)  2 - Symptomatic, <50% in bed during the day (Ambulatory and capable of all self care but unable to carry out any work activities. Up and about more than 50% of waking hours)  3 - Symptomatic, >50% in bed, but not bedbound (Capable of only limited self-care, confined to bed or chair 50% or more of waking hours)  4 - Bedbound (Completely disabled. Cannot carry on any self-care. Totally confined to bed or chair)  5 - Death   Eustace Pen MM, Creech RH, Tormey DC, et al. [phone]). "Toxicity and response criteria of the [facility]". [place] Oncol. 5 (6): 649-55   LABORATORY DATA:  Lab Results  Component Value Date   WBC 7.0 08/18/2021   HGB 14.5 08/18/2021   HCT 40.3 08/18/2021   MCV 85.0 08/18/2021   PLT 276 08/18/2021   CMP     Component Value Date/Time   NA 136 08/18/2021 1234   NA 142 07/27/2018 0000   K 3.6 08/18/2021 1234   CL 104 08/18/2021 1234   CO2 22 08/18/2021 1234   GLUCOSE 99 08/18/2021 1234   BUN 20 08/18/2021 1234   BUN 15 07/27/2018 0000   CREATININE 0.89 08/18/2021 1234   CREATININE 1.01 (H) 07/28/2020 0901   CALCIUM 9.4 08/18/2021 1234   PROT 7.1 08/18/2021 1234   ALBUMIN 4.1 08/18/2021 1234   AST 24 08/18/2021 1234   ALT 27 08/18/2021 1234   ALKPHOS 110 08/18/2021 1234   BILITOT 1.2 08/18/2021 1234   GFRNONAA >60 08/18/2021 1234   GFRNONAA 54 (L) 07/28/2020 0901   GFRAA 63 07/28/2020 0901         RADIOGRAPHY: US BREAST LTD UNI LEFT INC AXILLA  Result Date: 07/27/2021 CLINICAL DATA:  Short-term follow-up for a left breast asymmetry. The patient has family history of breast cancer in her sister and a maternal aunt. EXAM: DIGITAL DIAGNOSTIC UNILATERAL LEFT MAMMOGRAM WITH TOMOSYNTHESIS AND CAD; ULTRASOUND LEFT BREAST LIMITED TECHNIQUE: Left digital diagnostic  mammography and breast tomosynthesis was performed. The images were evaluated with computer-aided detection.; Targeted ultrasound examination of the left breast was performed. COMPARISON:  Previous exam(s). ACR Breast Density Category b: There are scattered areas of fibroglandular density. FINDINGS: Spot compression tomosynthesis images through the region of the asymmetry in the medial middle depth of the left breast demonstrates a persistent asymmetry with some associated distortion. There are a few calcifications in a linear array leading from the asymmetry to the anteromedial aspect of the left breast measuring approximately 1.0 cm. The distortion in association with the calcifications spans approximately 3.6 cm. Approximately 1 cm lateral to the linear calcifications is a faint 2 mm group of calcifications. Ultrasound targeted to the left breast at 9 o'clock, 2 cm from the nipple demonstrates an ill-defined area of shadowing measuring 1.8 x 0.9 x 1.2 cm. Ultrasound of the left axilla demonstrates multiple normal-appearing lymph nodes. IMPRESSION: 1. There  is a suspicious mass in the left breast at 9 o'clock measuring 1.8 cm. 2. There is a 1.0 cm group of suspicious linear calcifications anterior to the suspicious mass in the left breast, as well as a faint 2 mm group which lie 1 cm lateral to the linear calcifications. 3.  No evidence of left axillary lymphadenopathy. RECOMMENDATION: 1. Ultrasound-guided biopsy is recommended for the left breast mass at 9 o'clock. 2. Stereotactic biopsy is recommended for the calcifications in the medial left breast. The ultrasound and stereotactic biopsy procedures have been scheduled for 08/11/2021 at 7:30 a.m. 3. Note that there is a 2 mm group of calcifications just lateral to the group recommended for biopsy. These will need to be localized for surgical excision with the remainder of the calcifications and the distortion. 4. Depending on the pathology results, diagnostic  mammogram of the right breast may be recommended for screening as her prior mammogram was performed in May of 2022. MRI may also be considered for extent of disease given the patient's family history. I have discussed the findings and recommendations with the patient. If applicable, a reminder letter will be sent to the patient regarding the next appointment. BI-RADS CATEGORY  5: Highly suggestive of malignancy. Electronically Signed   By: Ammie Ferrier M.D.   On: 07/27/2021 13:37  MM DIAG BREAST TOMO UNI LEFT  Result Date: 07/27/2021 CLINICAL DATA:  Short-term follow-up for a left breast asymmetry. The patient has family history of breast cancer in her sister and a maternal aunt. EXAM: DIGITAL DIAGNOSTIC UNILATERAL LEFT MAMMOGRAM WITH TOMOSYNTHESIS AND CAD; ULTRASOUND LEFT BREAST LIMITED TECHNIQUE: Left digital diagnostic mammography and breast tomosynthesis was performed. The images were evaluated with computer-aided detection.; Targeted ultrasound examination of the left breast was performed. COMPARISON:  Previous exam(s). ACR Breast Density Category b: There are scattered areas of fibroglandular density. FINDINGS: Spot compression tomosynthesis images through the region of the asymmetry in the medial middle depth of the left breast demonstrates a persistent asymmetry with some associated distortion. There are a few calcifications in a linear array leading from the asymmetry to the anteromedial aspect of the left breast measuring approximately 1.0 cm. The distortion in association with the calcifications spans approximately 3.6 cm. Approximately 1 cm lateral to the linear calcifications is a faint 2 mm group of calcifications. Ultrasound targeted to the left breast at 9 o'clock, 2 cm from the nipple demonstrates an ill-defined area of shadowing measuring 1.8 x 0.9 x 1.2 cm. Ultrasound of the left axilla demonstrates multiple normal-appearing lymph nodes. IMPRESSION: 1. There is a suspicious mass in the  left breast at 9 o'clock measuring 1.8 cm. 2. There is a 1.0 cm group of suspicious linear calcifications anterior to the suspicious mass in the left breast, as well as a faint 2 mm group which lie 1 cm lateral to the linear calcifications. 3.  No evidence of left axillary lymphadenopathy. RECOMMENDATION: 1. Ultrasound-guided biopsy is recommended for the left breast mass at 9 o'clock. 2. Stereotactic biopsy is recommended for the calcifications in the medial left breast. The ultrasound and stereotactic biopsy procedures have been scheduled for 08/11/2021 at 7:30 a.m. 3. Note that there is a 2 mm group of calcifications just lateral to the group recommended for biopsy. These will need to be localized for surgical excision with the remainder of the calcifications and the distortion. 4. Depending on the pathology results, diagnostic mammogram of the right breast may be recommended for screening as her prior mammogram was performed in  May of 2022. MRI may also be considered for extent of disease given the patient's family history. I have discussed the findings and recommendations with the patient. If applicable, a reminder letter will be sent to the patient regarding the next appointment. BI-RADS CATEGORY  5: Highly suggestive of malignancy. Electronically Signed   By: Ammie Ferrier M.D.   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 Evaluation of the RIGHT breast prior to treatment. EXAM: DIGITAL DIAGNOSTIC UNILATERAL RIGHT MAMMOGRAM WITH TOMOSYNTHESIS AND CAD TECHNIQUE: Right digital diagnostic mammography and breast tomosynthesis was performed. The images were evaluated with computer-aided detection. COMPARISON:  Previous exam(s). ACR Breast Density Category c: The breast tissue is heterogeneously dense, which may obscure small masses. FINDINGS: No  suspicious mass, distortion, or microcalcifications are identified to suggest presence of malignancy. IMPRESSION: No mammographic evidence for malignancy. RECOMMENDATION: Bilateral diagnostic mammogram is suggested in 1 year. (Code:[id number]-01Y) I have discussed the findings and recommendations with the patient. If applicable, a reminder letter will be sent to the patient regarding the next appointment. BI-RADS CATEGORY  1: Negative. Electronically Signed   By: Nolon Nations M.D.   On: 08/13/2021 11:17  MM CLIP PLACEMENT LEFT  Result Date: 08/11/2021 CLINICAL DATA:  Status post ultrasound-guided and stereotactic biopsies today. EXAM: 3D DIAGNOSTIC LEFT MAMMOGRAM POST ULTRASOUND AND STEREOTACTIC BIOPSIES COMPARISON:  Previous exam(s). FINDINGS: 3D Mammographic images were obtained following ultrasound guided biopsy of a suspicious mass within the LEFT breast at the 9 o'clock axis followed by stereotactic biopsy of suspicious calcifications in the lower inner quadrant of the LEFT breast. The biopsy marking clips are in expected position at the sites of biopsy. IMPRESSION: 1. Appropriate positioning of the ribbon shaped biopsy marking clip at the site of biopsy in the inner LEFT breast corresponding to the targeted mass at the 9 o'clock axis. 2. Appropriate positioning of the X shaped biopsy marking clip at the site of biopsy in the lower inner quadrant of the LEFT breast, 5 mm lateral to the biopsied calcifications. Final Assessment: Post Procedure Mammograms for Marker Placement Electronically Signed   By: Franki Cabot M.D.   On: 08/11/2021 09:08  MM LT BREAST BX W LOC DEV 1ST LESION IMAGE BX SPEC STEREO GUIDE  Addendum Date: 08/12/2021   ADDENDUM REPORT: 08/12/2021 14:40 ADDENDUM: Pathology revealed GRADE II INVASIVE MAMMARY CARCINOMA of the LEFT breast, 9 o'clock, (ribbon clip). This was found to be concordant by Dr. Franki Cabot. 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 This was found to be concordant by Dr. Franki Cabot. Pathology results were discussed with the patient by telephone. The patient reported doing well after the biopsies with tenderness at the sites. Post biopsy instructions and care were reviewed and questions were answered. The patient was encouraged to call [facility] for any additional concerns. My direct phone number was provided. The patient was referred to [facility] on August 18, 2021. Request for RIGHT diagnostic mammogram sent to Donette Larry, [facility] Scheduling Supervisor, via secure email on August 12, 2021. The patient will be contacted by the scheduling department to arrange appointment. MRI may also be considered for extent of disease given the patient's family history. NOTE: There is  a 2 mm group of calcifications just lateral to the lower inner quadrant group biopsied on August 11, 2021. These will need to be localized for surgical excision with the remainder of the calcifications and the distortion if breast conservation surgery is performed. Pathology results reported by Terie Purser, RN on 08/12/2021. Electronically Signed   By: Franki Cabot M.D.   On: 08/12/2021 14:40   Result Date: 08/12/2021 CLINICAL DATA:  Patient with a suspicious mass in the LEFT breast at the 9 o'clock axis presents today for ultrasound-guided biopsy. Patient also with suspicious calcifications in the lower inner quadrant of the LEFT breast presents today for stereotactic-guided biopsy. EXAM: LEFT BREAST ULTRASOUND GUIDED CORE NEEDLE BIOPSY LEFT BREAST STEREOTACTIC CORE NEEDLE BIOPSY COMPARISON:  Previous exams. FINDINGS: The patient and I discussed the procedure of ultrasound-guided and stereotactic-guided biopsies including benefits and alternatives. We discussed the high likelihood of a  successful procedure. We discussed the risks of the procedure including infection, bleeding, tissue injury, clip migration, and inadequate sampling. Informed written consent was given. The usual time out protocol was performed immediately prior to the procedure. Site 1: lesion quadrant: 9 o'clock Using sterile technique and 1% Lidocaine as local anesthetic, under direct ultrasound visualization, a 12 gauge spring-loaded device was used to perform biopsy of the LEFT breast mass at the 9 o'clock axis using a lateral approach. At the conclusion of the procedure ribbon shaped tissue marker clip was deployed into the biopsy cavity. Site 2: Lesion quadrant: Lower inner quadrant Using sterile technique and 1% Lidocaine as local anesthetic, under stereotactic guidance, a 9 gauge vacuum assisted device was used to perform core needle biopsy of calcifications in the lower outer quadrant of the left breast using a medial approach. Specimen radiograph was performed showing calcifications. Specimens with calcifications are identified for pathology. At the conclusion of the procedure, and X shaped tissue marker clip was deployed into the biopsy cavity. Follow up 2 view mammogram was performed and dictated separately. IMPRESSION: 1. Ultrasound-guided biopsy of suspicious calcifications within the lower inner quadrant of the LEFT breast. Ribbon shaped clip placed at the biopsy site. No apparent complications. 2. Stereotactic-guided biopsy of suspicious calcifications located within the lower inner quadrant of the LEFT breast. X shaped clip placed at the biopsy site. No apparent complications. Electronically Signed: By: Franki Cabot M.D. On: 08/11/2021 09:05  [place] LT BREAST BX W LOC DEV 1ST LESION IMG BX SPEC US GUIDE  Addendum Date: 08/12/2021   ADDENDUM REPORT: 08/12/2021 14:40 ADDENDUM: Pathology revealed GRADE II INVASIVE MAMMARY CARCINOMA of the LEFT breast, 9 o'clock, (ribbon clip). This was found to be concordant by Dr.  Franki Cabot. 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 This was found to be concordant by Dr. Franki Cabot. Pathology results were discussed with the patient by telephone. The patient reported doing well after the biopsies with tenderness at the sites. Post biopsy instructions and care were reviewed and questions were answered. The patient was encouraged to call [facility] for any additional concerns. My direct phone number was provided. The patient was referred to [facility] on August 18, 2021. Request for RIGHT diagnostic mammogram sent to Donette Larry, [facility] Scheduling Supervisor, via secure email on August 12, 2021. The patient will be contacted by the scheduling department to arrange appointment. MRI may also be considered for extent of  disease given the patient's family history. NOTE: There is a 2 mm group of calcifications just lateral to the lower inner quadrant group biopsied on August 11, 2021. These will need to be localized for surgical excision with the remainder of the calcifications and the distortion if breast conservation surgery is performed. Pathology results reported by Terie Purser, RN on 08/12/2021. Electronically Signed   By: Franki Cabot M.D.   On: 08/12/2021 14:40   Result Date: 08/12/2021 CLINICAL DATA:  Patient with a suspicious mass in the LEFT breast at the 9 o'clock axis presents today for ultrasound-guided biopsy. Patient also with suspicious calcifications in the lower inner quadrant of the LEFT breast presents today for stereotactic-guided biopsy. EXAM: LEFT BREAST ULTRASOUND GUIDED CORE NEEDLE BIOPSY LEFT BREAST STEREOTACTIC CORE NEEDLE BIOPSY COMPARISON:  Previous exams. FINDINGS: The patient and I discussed the procedure of ultrasound-guided and stereotactic-guided  biopsies including benefits and alternatives. We discussed the high likelihood of a successful procedure. We discussed the risks of the procedure including infection, bleeding, tissue injury, clip migration, and inadequate sampling. Informed written consent was given. The usual time out protocol was performed immediately prior to the procedure. Site 1: lesion quadrant: 9 o'clock Using sterile technique and 1% Lidocaine as local anesthetic, under direct ultrasound visualization, a 12 gauge spring-loaded device was used to perform biopsy of the LEFT breast mass at the 9 o'clock axis using a lateral approach. At the conclusion of the procedure ribbon shaped tissue marker clip was deployed into the biopsy cavity. Site 2: Lesion quadrant: Lower inner quadrant Using sterile technique and 1% Lidocaine as local anesthetic, under stereotactic guidance, a 9 gauge vacuum assisted device was used to perform core needle biopsy of calcifications in the lower outer quadrant of the left breast using a medial approach. Specimen radiograph was performed showing calcifications. Specimens with calcifications are identified for pathology. At the conclusion of the procedure, and X shaped tissue marker clip was deployed into the biopsy cavity. Follow up 2 view mammogram was performed and dictated separately. IMPRESSION: 1. Ultrasound-guided biopsy of suspicious calcifications within the lower inner quadrant of the LEFT breast. Ribbon shaped clip placed at the biopsy site. No apparent complications. 2. Stereotactic-guided biopsy of suspicious calcifications located within the lower inner quadrant of the LEFT breast. X shaped clip placed at the biopsy site. No apparent complications. Electronically Signed: By: Franki Cabot M.D. 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 We discussed the risks, benefits, and  side effects of radiotherapy. I recommend radiotherapy to the left breast to reduce her risk of locoregional recurrence by 2/3.  We discussed that radiation would take approximately 1-4 weeks to complete and that I would give the patient a few weeks to heal following surgery before starting treatment planning.  If chemotherapy were to be given, this would precede radiotherapy. We spoke about acute effects including skin irritation and fatigue as well as much less common late effects including internal organ injury or irritation. We spoke about the latest technology that is used to minimize the risk of late effects for patients undergoing radiotherapy to the breast or chest wall. No guarantees of treatment were given. The patient is enthusiastic about proceeding with treatment.  She understands that radiation therapy would probably not improve her life expectancy but would reduce the risk of local recurrence in her left breast and she  is enthusiastic about taking this measure. I look forward to participating in the patient's care.  I will await her referral back to me for postoperative follow-up and eventual CT simulation/treatment planning.  On date of service, in total, I spent 45 minutes on this encounter. Patient was seen in person.   __________________________________________   Eppie Gibson, MD  This document serves as a record of services personally performed by Eppie Gibson, MD. It was created on her behalf by Roney Mans, a trained medical scribe. The creation of this record is based on the scribe's personal observations and the provider's statements to them. This document has been checked and approved by the attending provider.

## 2021-08-18 ENCOUNTER — Encounter: Payer: Self-pay | Admitting: Radiation Oncology

## 2021-08-18 ENCOUNTER — Inpatient Hospital Stay: Payer: Medicare Other | Attending: Hematology

## 2021-08-18 ENCOUNTER — Encounter: Payer: Self-pay | Admitting: General Practice

## 2021-08-18 ENCOUNTER — Encounter: Payer: Self-pay | Admitting: *Deleted

## 2021-08-18 ENCOUNTER — Other Ambulatory Visit: Payer: Self-pay

## 2021-08-18 ENCOUNTER — Ambulatory Visit (HOSPITAL_BASED_OUTPATIENT_CLINIC_OR_DEPARTMENT_OTHER): Payer: Medicare Other | Admitting: Genetic Counselor

## 2021-08-18 ENCOUNTER — Ambulatory Visit: Payer: Self-pay | Admitting: Surgery

## 2021-08-18 ENCOUNTER — Ambulatory Visit: Payer: Medicare Other | Admitting: Physical Therapy

## 2021-08-18 ENCOUNTER — Telehealth: Payer: Self-pay | Admitting: *Deleted

## 2021-08-18 ENCOUNTER — Ambulatory Visit
Admission: RE | Admit: 2021-08-18 | Discharge: 2021-08-18 | Disposition: A | Discharge status: Home / Self Care | Payer: Medicare Other | Source: Ambulatory Visit | Attending: Radiation Oncology | Admitting: Radiation Oncology

## 2021-08-18 ENCOUNTER — Inpatient Hospital Stay (HOSPITAL_BASED_OUTPATIENT_CLINIC_OR_DEPARTMENT_OTHER): Payer: Medicare Other | Admitting: Hematology

## 2021-08-18 ENCOUNTER — Encounter: Payer: Self-pay | Admitting: Hematology

## 2021-08-18 VITALS — BP 162/63 | HR 71 | Temp 97.6°F | Resp 17 | Ht 61.0 in | Wt 152.9 lb

## 2021-08-18 DIAGNOSIS — C50912 Malignant neoplasm of unspecified site of left female breast: Secondary | ICD-10-CM

## 2021-08-18 DIAGNOSIS — Z803 Family history of malignant neoplasm of breast: Secondary | ICD-10-CM

## 2021-08-18 DIAGNOSIS — F419 Anxiety disorder, unspecified: Secondary | ICD-10-CM | POA: Insufficient documentation

## 2021-08-18 DIAGNOSIS — Z8042 Family history of malignant neoplasm of prostate: Secondary | ICD-10-CM

## 2021-08-18 DIAGNOSIS — I10 Essential (primary) hypertension: Secondary | ICD-10-CM | POA: Diagnosis not present

## 2021-08-18 DIAGNOSIS — Z87891 Personal history of nicotine dependence: Secondary | ICD-10-CM | POA: Insufficient documentation

## 2021-08-18 DIAGNOSIS — M1990 Unspecified osteoarthritis, unspecified site: Secondary | ICD-10-CM

## 2021-08-18 DIAGNOSIS — Z85820 Personal history of malignant melanoma of skin: Secondary | ICD-10-CM

## 2021-08-18 DIAGNOSIS — Z170 Estrogen receptor positive status [ER+]: Secondary | ICD-10-CM

## 2021-08-18 DIAGNOSIS — C50812 Malignant neoplasm of overlapping sites of left female breast: Secondary | ICD-10-CM

## 2021-08-18 DIAGNOSIS — Z90710 Acquired absence of both cervix and uterus: Secondary | ICD-10-CM

## 2021-08-18 DIAGNOSIS — C50212 Malignant neoplasm of upper-inner quadrant of left female breast: Secondary | ICD-10-CM | POA: Diagnosis not present

## 2021-08-18 DIAGNOSIS — Z800 Family history of malignant neoplasm of digestive organs: Secondary | ICD-10-CM

## 2021-08-18 LAB — CBC WITH DIFFERENTIAL (CANCER CENTER ONLY)
Abs Immature Granulocytes: 0.01 10*3/uL (ref 0.00–0.07)
Basophils Absolute: 0 10*3/uL (ref 0.0–0.1)
Basophils Relative: 1 %
Eosinophils Absolute: 0.1 10*3/uL (ref 0.0–0.5)
Eosinophils Relative: 2 %
HCT: 40.3 % (ref 36.0–46.0)
Hemoglobin: 14.5 g/dL (ref 12.0–15.0)
Immature Granulocytes: 0 %
Lymphocytes Relative: 28 %
Lymphs Abs: 2 10*3/uL (ref 0.7–4.0)
MCH: 30.6 pg (ref 26.0–34.0)
MCHC: 36 g/dL (ref 30.0–36.0)
MCV: 85 fL (ref 80.0–100.0)
Monocytes Absolute: 0.6 10*3/uL (ref 0.1–1.0)
Monocytes Relative: 8 %
Neutro Abs: 4.3 10*3/uL (ref 1.7–7.7)
Neutrophils Relative %: 61 %
Platelet Count: 276 10*3/uL (ref 150–400)
RBC: 4.74 MIL/uL (ref 3.87–5.11)
RDW: 12.6 % (ref 11.5–15.5)
WBC Count: 7 10*3/uL (ref 4.0–10.5)
nRBC: 0 % (ref 0.0–0.2)

## 2021-08-18 LAB — CMP (CANCER CENTER ONLY)
ALT: 27 U/L (ref 0–44)
AST: 24 U/L (ref 15–41)
Albumin: 4.1 g/dL (ref 3.5–5.0)
Alkaline Phosphatase: 110 U/L (ref 38–126)
Anion gap: 10 (ref 5–15)
BUN: 20 mg/dL (ref 8–23)
CO2: 22 mmol/L (ref 22–32)
Calcium: 9.4 mg/dL (ref 8.9–10.3)
Chloride: 104 mmol/L (ref 98–111)
Creatinine: 0.89 mg/dL (ref 0.44–1.00)
GFR, Estimated: >60 mL/min (ref >60)
Glucose, Bld: 99 mg/dL (ref 70–99)
Potassium: 3.6 mmol/L (ref 3.5–5.1)
Sodium: 136 mmol/L (ref 135–145)
Total Bilirubin: 1.2 mg/dL (ref 0.3–1.2)
Total Protein: 7.1 g/dL (ref 6.5–8.1)

## 2021-08-18 LAB — GENETIC SCREENING ORDER

## 2021-08-18 NOTE — Progress Notes (Signed)
[place]   [phone] Fax:([phone] [place] Note   Patient Care Team: Leamon Arnt, MD as PCP - General (Family Medicine) Sueanne Margarita, MD as PCP - Cardiology (Cardiology) Sueanne Margarita, MD as Consulting Physician (Cardiology) Hayden Pedro, MD as Consulting Physician (Ophthalmology) Warren Danes, PA-C as Physician Assistant (Dermatology) Madelin Rear, [facility] as Pharmacist (Pharmacist) Donnie Mesa, MD as Consulting Physician (General Surgery) Truitt Merle, MD as Consulting Physician (Hematology) Eppie Gibson, MD as Attending Physician (Radiation Oncology) Mauro Kaufmann, RN as Oncology Nurse Navigator Rockwell Germany, RN as Oncology Nurse Navigator  Date of Service:  08/18/2021   CHIEF COMPLAINTS/PURPOSE OF CONSULTATION:  Left Breast Cancer, ER+  REFERRING PHYSICIAN:  [facility]   ASSESSMENT & PLAN:  Madison Clements is a 75 y.o. postmenopausal female with   1. 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 Left diagnostic MM and [place] on 07/27/21 showed 1.8 cm mass at 9 o'clock, and 1 cm calcifications anterior to primary mass. Biopsy 08/11/21 confirmed invasive mammary carcinoma. --We discussed her imaging findings and the biopsy results in great details. We are awaiting her E-cadherin results to see if this is ductal or lobular histology. -Given the early stage disease, she likely need a lumpectomy. She is agreeable with that. She was seen by Dr. Georgette Dover today and likely will proceed with surgery soon.  -she would like an MRI to evaluate the extent of her disease. This is scheduled for 08/25/21. -I discussed the role of a Oncotype Dx test on the surgical sample to determine her risk of recurrence and benefit of chemo. She would like to proceed. Written material of this test was given to her.   -Giving the strong ER and PR expression in her postmenopausal status, I recommend adjuvant endocrine therapy with aromatase inhibitor or Tamoxifen for a total of 5-10 years to reduce the risk of cancer recurrence. Given her arthritis at baseline, I would recommend tamoxifen. Potential benefits and side effects were discussed with patient and she is interested. -She was also seen by radiation oncologist Dr. Isidore Moos today. She will likely benefit from breast radiation if she undergo lumpectomy to decrease the risk of breast cancer.  -We also discussed the breast cancer surveillance after her surgery. She will continue annual screening mammogram, self exam, and a routine office visit with lab and exam with [place]. -I encouraged her to have healthy diet and exercise regularly.   2. Osteopenia, Arthritis -Her most recent DEXA was 11/15/19 showing osteopenia (T-score -1.5). -she also has a lot of arthritis at baseline, is on Celebrex, and is s/p steroid injections to knees and hips.  3. Social Support, anxiety  -she currently has ~10 drinks per week. I discussed the effect of alcohol on our health and encouraged her to limit her intake. -she reports new anxiety secondary to her diagnosis. She denies history of depression, anxiety, or medication use. I offered to start her on Effexor; she would like to try.   PLAN:  -we will call her with E-cadherin results -I prescribed Effexor for her today -breast MRI 08/25/21 -proceed with lumpectomy soon -Oncotype on her surgical sample  -I will see her back after surgery or after radiation    Oncology History Overview Note   Cancer Staging  Malignant neoplasm of overlapping sites of left breast in female, estrogen receptor  positive ([place]) Staging form: Breast, AJCC 8th Edition - Clinical stage from 08/11/2021: cT1c, cN0, cM0, G2 - Signed by Truitt Merle, MD on 08/17/2021    Malignant neoplasm of overlapping sites of left breast in female, estrogen receptor  positive ([place])  07/27/2021 Mammogram   EXAM: DIGITAL DIAGNOSTIC UNILATERAL LEFT MAMMOGRAM WITH TOMOSYNTHESIS AND CAD; ULTRASOUND LEFT BREAST LIMITED  IMPRESSION: 1. There is a suspicious mass in the left breast at 9 o'clock measuring 1.8 cm.   2. There is a 1.0 cm group of suspicious linear calcifications anterior to the suspicious mass in the left breast, as well as a faint 2 mm group which lie 1 cm lateral to the linear calcifications.   3.  No evidence of left axillary lymphadenopathy   08/11/2021 Cancer Staging   Staging form: Breast, AJCC 8th Edition - Clinical stage from 08/11/2021: cT1c, cN0, cM0, G2 - Signed by Truitt Merle, MD on 08/17/2021 Stage prefix: Initial diagnosis Histologic grading system: 3 grade system    08/11/2021 Initial Biopsy   Diagnosis 1. Breast, left, needle core biopsy, 9 o'clock, ribbon clip - INVASIVE MAMMARY CARCINOMA - SEE COMMENT 2. Breast, left, needle core biopsy, lower inner quadrant, x clip - INVASIVE MAMMARY CARCINOMA - MAMMARY CARCINOMA IN-SITU - CALCIFICATIONS - SEE COMMENT Microscopic Comment 1. The biopsy material shows an infiltrative proliferation of cells with arranged linearly and in small clusters. Based on the biopsy, the carcinoma appears Nottingham grade 2 of 3 and measures 1.2 cm in greatest linear extent. 2. Based on the biopsy, the carcinoma appears Nottingham grade 2 of 3 and measures 0.2 cm in greatest linear extent.  1. PROGNOSTIC INDICATORS Results: The tumor cells are NEGATIVE for Her2 (1+). Estrogen Receptor: 100%, POSITIVE, STRONG STAINING INTENSITY Progesterone Receptor: 60%, POSITIVE, STRONG STAINING INTENSITY Proliferation Marker Ki67: 10%  2. PROGNOSTIC INDICATORS Results: The tumor cells are NEGATIVE for Her2 (1+). Estrogen Receptor: 100%, POSITIVE, STRONG STAINING INTENSITY Progesterone Receptor: 60%, POSITIVE, STRONG STAINING INTENSITY Proliferation Marker Ki67: 15%   08/13/2021 Mammogram    EXAM: DIGITAL DIAGNOSTIC UNILATERAL RIGHT MAMMOGRAM WITH TOMOSYNTHESIS AND CAD  IMPRESSION: No mammographic evidence for malignancy.   08/16/2021 Initial Diagnosis   Malignant neoplasm of overlapping sites of left breast in female, estrogen receptor positive ([place])      HISTORY OF PRESENTING ILLNESS:  Madison Clements 75 y.o. female is a here because of breast cancer. The patient was referred by [facility]. The patient presents to the clinic today accompanied by her sister, who has a history of DCIS, and her daughter via phone.   She was under short-term follow up for left breast asymmetry. She underwent left diagnostic mammography and left breast ultrasonography on 07/27/21 showing: 1.8 cm mass at .  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 Prognostic indicators significant for: estrogen receptor, 100% positive and progesterone receptor, 60% positive. Proliferation marker Ki67 at 10-15%. HER2 negative.  Work up with right diagnostic mammogram was negative.   Today the patient notes they felt/feeling prior/after... -she reports previous abnormal mammogram in 1998, biopsy showed cystic lesion. -she has some anxiety related to her diagnosis.   She has a PMHx of.... -arthritis, for last 10+ years, on Celebrex daily, prior injections in knees and hips -sleep apnea, plans for implantable device after cancer treatment -pulmonary hypertension -s/p hysterectomy with BSO at age 1, for fibroids -melanoma   Socially... -she is widowed (as of 2018) with one daughter. -sister with  DCIS, a great aunt with breast cancer -currently drinks ~10 drinks per week (5 glasses of wine and 5 cocktails). Prior smoker, quit in 1998.   GYN HISTORY  Menarchal: 75 years old LMP: ~age [age] (started menopause prior to hysterectomy) Contraceptive: HRT: used for 10-15 years GP: 1, at age 50   REVIEW OF  SYSTEMS:    Constitutional: Denies fevers, chills or abnormal night sweats Eyes: Denies blurriness of vision, double vision or watery eyes Ears, nose, mouth, throat, and face: Denies mucositis or sore throat Respiratory: Denies cough, dyspnea or wheezes Cardiovascular: Denies palpitation, chest discomfort, (+) feet swelling Gastrointestinal:  Denies nausea, heartburn or change in bowel habits Skin: Denies abnormal skin rashes Lymphatics: Denies new lymphadenopathy or easy bruising Neurological:Denies numbness, tingling or new weaknesses Behavioral/Psych: Mood is stable, no new changes  All other systems were reviewed with the patient and are negative.   MEDICAL HISTORY:  Past Medical History:  Diagnosis Date   Allergy 1990   Morphine following surgery   Arthritis    generalized   Atypical mole 02/24/2015   RGHT LATERAL THIGH MODERATE   Atypical nevi 02/24/2015   RIGHT NECK MILD   Atypical nevi 08/25/2015   LEFT UPPER ARM MILD   Atypical nevi 08/25/2015   LEFT LATERAL FOREARM MILD/FREE   Atypical nevi 02/23/2016   LEFT THIGH MODERATE/FREE   Atypical nevi 02/23/2016   LEFT MEDIAL LEG MODERATE/FREE   Atypical nevi 02/23/2016   LEFT UPPER BACK MILD /FREE   Atypical nevi 08/23/2016   RIGHT ANT PROX THIGH MILD /FREE   Atypical nevi 07/25/2018   MID LOWER BACK MODERATE   Atypical nevi 01/22/2019   LEFT MID BACK MILD /FREE   Atypical nevi 01/22/2019   LEFT NECK MILD   Atypical nevi 08/06/2019   LEFT INNER BREAST MODERATE W/S   Atypical nevi 08/06/2019   LEFT OUTER SIDE MILD/FREE   Blood transfusion without reported diagnosis    hx of   Breast cancer ([place]) 2022   CAD (coronary artery disease), native coronary artery 02/07/2019   Minimal CAD at the time of cath in 2016 in [place] [facility] 05/21/2020   Gastric polyps 10/02/2018   EGD 03/2015; benign   GERD (gastroesophageal reflux disease) 10/02/2018   on meds   History of melanoma 10/02/2018    Abdomen 2012; 2013; s/p local excisions.    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 DISTAL LOWER LEG TREATED BY DR. Delton Coombes   Sleep apnea    Sleep apnea 05/26/2020   uses CPAP    SURGICAL HISTORY: Past Surgical History:  Procedure Laterality Date   ABDOMINAL HYSTERECTOMY  [place]   BLADDER SUSPENSION  1990   COLONOSCOPY  2016   in [place]-hx of polyps   EYE SURGERY  2013 torn retina laser   MENISCUS REPAIR Right 2001   right knee   thumb surgery Left    TONSILLECTOMY AND ADENOIDECTOMY     tuabl ligation     TUBAL LIGATION  1975   WISDOM TOOTH EXTRACTION      SOCIAL HISTORY: Social History   Socioeconomic History   Marital status: Widowed    Spouse name: Not on file  Number of children: 1   Years of education: Not on file   Highest education level: Not on file  Occupational History   Occupation: Retired [employer]  Tobacco Use   Smoking status: Former    Packs/day: 0.25    Years: 10.00    Pack years: 2.50    Types: Cigarettes    Quit date: 09/12/1996    Years since quitting: 24.9   Smokeless tobacco: Never  Vaping Use   Vaping Use: Never used  Substance and Sexual Activity   Alcohol use: Yes    Alcohol/week: 10.0 standard drinks    Types: 5 Glasses of wine, 5 Shots of liquor per week    Comment: occassional    Drug use: Never   Sexual activity: Not Currently    Birth control/protection: Abstinence, Post-menopausal    Comment: hysterectomy  Other Topics Concern   Not on file  Social History Narrative   Widowed 2018 after 24 years of marriage   One daughter, 2 grandchildren. Lives in [place]   Sister lives in same complex in [place]    Previously lived in [place], [place]   Social Determinants of Health   Financial  Resource Strain: Low Risk    Difficulty of Paying Living Expenses: Not hard at all  Food Insecurity: No Food Insecurity   Worried About Charity fundraiser in the Last Year: Never true   Arboriculturist in the Last Year: Never true  Transportation Needs: No Transportation Needs   Lack of Transportation (Medical): No   Lack of Transportation (Non-Medical): No  Physical Activity: Insufficiently Active   Days of Exercise per Week: 2 days   Minutes of Exercise per Session: 20 min  Stress: No Stress Concern Present   Feeling of Stress : Not at all  Social Connections: Moderately Integrated   Frequency of Communication with Friends and Family: More than three times a week   Frequency of Social Gatherings with Friends and Family: More than three times a week   Attends Religious Services: 1 to 4 times per year   Active Member of [employer] or Organizations: Yes   Attends Archivist Meetings: 1 to 4 times per year   Marital Status: Widowed  Human resources officer Violence: Not At Risk   Fear of Current or Ex-Partner: No   Emotionally Abused: No   Physically Abused: No   Sexually Abused: No    FAMILY HISTORY: Family History  Problem Relation Age of Onset   Arthritis Mother    High blood pressure Mother    Hyperlipidemia Mother    Hypertension Mother    Stroke Mother    Varicose Veins Mother    Heart attack Father    Heart disease Father    High blood pressure Father    Hypertension Father    Arthritis Sister    Diabetes Sister    High Cholesterol Sister    High blood pressure Sister    Breast cancer Sister    Cancer Sister    Varicose Veins Sister    Cancer Maternal Aunt    Pancreatic cancer Maternal Aunt    Cancer Maternal Aunt    Heart disease Maternal Uncle    Heart disease Maternal Uncle    Stroke Paternal Aunt    Early death Maternal Grandmother        Stroke at 42   High blood pressure Maternal Grandmother    Hypertension Maternal Grandmother    Stroke Maternal  Grandmother  Asthma Maternal Grandfather    Kidney disease Maternal Grandfather    Arthritis Paternal Grandmother    Heart disease Paternal Grandmother    High blood pressure Paternal Grandmother    Hypertension Paternal Grandmother    Kidney disease Paternal Grandfather    Asthma Paternal Grandfather    COPD Paternal Grandfather    Colon polyps Neg Hx    Colon cancer Neg Hx    Esophageal cancer Neg Hx    Rectal cancer Neg Hx    Stomach cancer Neg Hx     ALLERGIES:  is allergic to morphine and related.  MEDICATIONS:  Current Outpatient Medications  Medication Sig Dispense Refill   celecoxib (CELEBREX) 100 MG capsule TAKE 1 CAPSULE (100 MG TOTAL) BY MOUTH DAILY. 90 capsule 3   diclofenac Sodium (VOLTAREN) 1 % GEL Apply 2 g topically 4 (four) times daily as needed. 350 g 5   diltiazem (TIAZAC) 180 MG 24 hr capsule Take 1 capsule (180 mg total) by mouth daily. 90 capsule 3   folic acid (FOLVITE) 638 MCG tablet Take 400 mcg by mouth daily.     furosemide (LASIX) 40 MG tablet Take 1 tablet (40 mg total) daily. Take an extra 1/2 tablet (20 mg total) as needed. 135 tablet 3   lisinopril (ZESTRIL) 10 MG tablet Take 1 tablet (10 mg total) by mouth daily. 90 tablet 3   omeprazole (PRILOSEC) 20 MG capsule TAKE 1 CAPSULE (20 MG TOTAL) BY MOUTH DAILY. 90 capsule 3   predniSONE (DELTASONE) 10 MG tablet Take 4 tabs qd x 2 days, 3 qd x 2 days, 2 qd x 2d, 1qd x 3 days 21 tablet 0   Probiotic Product (ADVANCED PROBIOTIC PO) Take 1 capsule by mouth daily.     rosuvastatin (CRESTOR) 40 MG tablet Take 1 tablet (40 mg total) by mouth at bedtime. 90 tablet 3   Specialty Vitamins Products (ONE-A-DAY BONE STRENGTH PO) Take 1 tablet by mouth daily.      triamcinolone cream (KENALOG) 0.1 % Apply 1 application topically daily as needed. 453 g 3   No current facility-administered medications for this visit.    PHYSICAL EXAMINATION: ECOG PERFORMANCE STATUS: 0 - Asymptomatic  Vitals:   08/18/21 1254   BP: (!) 162/63  Pulse: 71  Resp: 17  Temp: 97.6 F (36.4 C)  SpO2: 100%   Filed Weights   08/18/21 1254  Weight: 69.4 kg    GENERAL:alert, no distress and comfortable SKIN: skin color, texture, turgor are normal, no rashes or significant lesions EYES: normal, Conjunctiva are pink and non-injected, sclera clear  NECK: supple, thyroid normal size, non-tender, without nodularity LYMPH:  no palpable lymphadenopathy in the cervical, axillary  LUNGS: clear to auscultation and percussion with normal breathing effort HEART: regular rate & rhythm and no murmurs and no lower extremity edema ABDOMEN:abdomen soft, non-tender and normal bowel sounds Musculoskeletal:no cyanosis of digits and no clubbing  NEURO: alert & oriented x 3 with fluent speech, no focal motor/sensory deficits BREAST: 1 cm lump, possibly from biopsy.   LABORATORY DATA:  I have reviewed the data as listed CBC Latest Ref Rng & Units 08/18/2021 07/09/2021 07/28/2020  WBC 4.0 - 10.5 K/uL 7.0 5.1 4.4  Hemoglobin 12.0 - 15.0 g/dL 14.5 14.2 14.5  Hematocrit 36.0 - 46.0 % 40.3 41.1 43.8  Platelets 150 - 400 K/uL 276 277.0 297    CMP Latest Ref Rng & Units 08/18/2021 07/09/2021 07/28/2020  Glucose 70 - 99 mg/dL 99 91 98  BUN  8 - 23 mg/dL _0 Creatinine 0.44 - 1.00 mg/dL 0.89 0.93 1.01(H)  Sodium 135 - 145 mmol/L 136 137 141  Potassium 3.5 - 5.1 mmol/L 3.6 4.3 3.7  Chloride 98 - 111 mmol/L 104 99 106  CO2 22 - 32 mmol/L _1 Calcium 8.9 - 10.3 mg/dL 9.4 10.2 10.2  Total Protein 6.5 - 8.1 g/dL 7.1 6.8 6.3  Total Bilirubin 0.3 - 1.2 mg/dL 1.2 1.1 0.7  Alkaline Phos 38 - 126 U/L 110 82 -  AST 15 - 41 U/L _2 ALT 0 - 44 U/L _3 RADIOGRAPHIC STUDIES: I have personally reviewed the radiological images as listed and agreed with the findings in the report. US BREAST LTD UNI LEFT INC AXILLA  Result Date: 07/27/2021 CLINICAL DATA:  Short-term follow-up for a left breast asymmetry. The patient has  family history of breast cancer in her sister and a maternal aunt. EXAM: DIGITAL DIAGNOSTIC UNILATERAL LEFT MAMMOGRAM WITH TOMOSYNTHESIS AND CAD; ULTRASOUND LEFT BREAST LIMITED TECHNIQUE: Left digital diagnostic mammography and breast tomosynthesis was performed. The images were evaluated with computer-aided detection.; Targeted ultrasound examination of the left breast was performed. COMPARISON:  Previous exam(s). ACR Breast Density Category b: There are scattered areas of fibroglandular density. FINDINGS: Spot compression tomosynthesis images through the region of the asymmetry in the medial middle depth of the left breast demonstrates a persistent asymmetry with some associated distortion. There are a few calcifications in a linear array leading from the asymmetry to the anteromedial aspect of the left breast measuring approximately 1.0 cm. The distortion in association with the calcifications spans approximately 3.6 cm. Approximately 1 cm lateral to the linear calcifications is a faint 2 mm group of calcifications. Ultrasound targeted to the left breast at 9 o'clock, 2 cm from the nipple demonstrates an ill-defined area of shadowing measuring 1.8 x 0.9 x 1.2 cm. Ultrasound of the left axilla demonstrates multiple normal-appearing lymph nodes. IMPRESSION: 1. There is a suspicious mass in the left breast at 9 o'clock measuring 1.8 cm. 2. There is a 1.0 cm group of suspicious linear calcifications anterior to the suspicious mass in the left breast, as well as a faint 2 mm group which lie 1 cm lateral to the linear calcifications. 3.  No evidence of left axillary lymphadenopathy. RECOMMENDATION: 1. Ultrasound-guided biopsy is recommended for the left breast mass at 9 o'clock. 2. Stereotactic biopsy is recommended for the calcifications in the medial left breast. The ultrasound and stereotactic biopsy procedures have been scheduled for 08/11/2021 at 7:30 a.m. 3. Note that there is a 2 mm group of calcifications just  lateral to the group recommended for biopsy. These will need to be localized for surgical excision with the remainder of the calcifications and the distortion. 4. Depending on the pathology results, diagnostic mammogram of the right breast may be recommended for screening as her prior mammogram was performed in May of 2022. MRI may also be considered for extent of disease given the patient's family history. I have discussed the findings and recommendations with the patient. If applicable, a reminder letter will be sent to the patient regarding the next appointment. BI-RADS CATEGORY  5: Highly suggestive of malignancy. Electronically Signed   By: Ammie Ferrier M.D.   On: 07/27/2021 13:37  MM DIAG BREAST TOMO UNI LEFT  Result Date: 07/27/2021 CLINICAL DATA:  Short-term follow-up for a left breast asymmetry. The patient has family history of breast cancer  in her sister and a maternal aunt. EXAM: DIGITAL DIAGNOSTIC UNILATERAL LEFT MAMMOGRAM WITH TOMOSYNTHESIS AND CAD; ULTRASOUND LEFT BREAST LIMITED TECHNIQUE: Left digital diagnostic mammography and breast tomosynthesis was performed. The images were evaluated with computer-aided detection.; Targeted ultrasound examination of the left breast was performed. COMPARISON:  Previous exam(s). ACR Breast Density Category b: There are scattered areas of fibroglandular density. FINDINGS: Spot compression tomosynthesis images through the region of the asymmetry in the medial middle depth of the left breast demonstrates a persistent asymmetry with some associated distortion. There are a few calcifications in a linear array leading from the asymmetry to the anteromedial aspect of the left breast measuring approximately 1.0 cm. The distortion in association with the calcifications spans approximately 3.6 cm. Approximately 1 cm lateral to the linear calcifications is a faint 2 mm group of calcifications. Ultrasound targeted to the left breast at 9 o'clock, 2 cm from the  nipple demonstrates an ill-defined area of shadowing measuring 1.8 x 0.9 x 1.2 cm. Ultrasound of the left axilla demonstrates multiple normal-appearing lymph nodes. IMPRESSION: 1. There is a suspicious mass in the left breast at 9 o'clock measuring 1.8 cm. 2. There is a 1.0 cm group of suspicious linear calcifications anterior to the suspicious mass in the left breast, as well as a faint 2 mm group which lie 1 cm lateral to the linear calcifications. 3.  No evidence of left axillary lymphadenopathy. RECOMMENDATION: 1. Ultrasound-guided biopsy is recommended for the left breast mass at 9 o'clock. 2. Stereotactic biopsy is recommended for the calcifications in the medial left breast. The ultrasound and stereotactic biopsy procedures have been scheduled for 08/11/2021 at 7:30 a.m. 3. Note that there is a 2 mm group of calcifications just lateral to the group recommended for biopsy. These will need to be localized for surgical excision with the remainder of the calcifications and the distortion. 4. Depending on the pathology results, diagnostic mammogram of the right breast may be recommended for screening as her prior mammogram was performed in May of 2022. MRI may also be considered for extent of disease given the patient's family history. I have discussed the findings and recommendations with the patient. If applicable, a reminder letter will be sent to the patient regarding the next appointment. BI-RADS CATEGORY  5: Highly suggestive of malignancy. Electronically Signed   By: Ammie Ferrier M.D.   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 Evaluation of the RIGHT breast prior to treatment. EXAM: DIGITAL DIAGNOSTIC UNILATERAL RIGHT MAMMOGRAM WITH TOMOSYNTHESIS AND CAD TECHNIQUE: Right digital diagnostic mammography and breast tomosynthesis was performed. The  images were evaluated with computer-aided detection. COMPARISON:  Previous exam(s). ACR Breast Density Category c: The breast tissue is heterogeneously dense, which may obscure small masses. FINDINGS: No suspicious mass, distortion, or microcalcifications are identified to suggest presence of malignancy. IMPRESSION: No mammographic evidence for malignancy. RECOMMENDATION: Bilateral diagnostic mammogram is suggested in 1 year. (Code:[id number]-B-01Y) I have discussed the findings and recommendations with the patient. If applicable, a reminder letter will be sent to the patient regarding the next appointment. BI-RADS CATEGORY  1: Negative. Electronically Signed   By: Nolon Nations M.D.   On: 08/13/2021 11:17  MM CLIP PLACEMENT LEFT  Result Date: 08/11/2021 CLINICAL DATA:  Status post ultrasound-guided and stereotactic biopsies today. EXAM: 3D DIAGNOSTIC LEFT MAMMOGRAM POST ULTRASOUND AND STEREOTACTIC BIOPSIES COMPARISON:  Previous exam(s). FINDINGS: 3D Mammographic images were obtained following ultrasound guided biopsy of a suspicious mass within the LEFT breast at the 9 o'clock axis followed by stereotactic biopsy of suspicious calcifications in the lower inner quadrant of the LEFT breast. The biopsy marking clips are in expected position at the sites of biopsy. IMPRESSION: 1. Appropriate positioning of the ribbon shaped biopsy marking clip at the site of biopsy in the inner LEFT breast corresponding to the targeted mass at the 9 o'clock axis. 2. Appropriate positioning of the X shaped biopsy marking clip at the site of biopsy in the lower inner quadrant of the LEFT breast, 5 mm lateral to the biopsied calcifications. Final Assessment: Post Procedure Mammograms for Marker Placement Electronically Signed   By: Franki Cabot M.D.   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 This was found to be concordant by Dr. Franki Cabot. 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 This was found to be concordant by Dr. Franki Cabot. Pathology results were discussed with the patient by telephone. The patient reported doing well after the biopsies with tenderness at the sites. Post biopsy instructions and care were reviewed and questions were answered. The patient was encouraged to call [facility] for any additional concerns. My direct phone number was provided. The patient was referred to [facility] on August 18, 2021. Request for RIGHT diagnostic mammogram sent to Donette Larry, [facility] Scheduling Supervisor, via secure email on August 12, 2021. The patient will be contacted by the scheduling department to arrange appointment. MRI may also be considered for extent of disease given the patient's family history. NOTE: There is a 2 mm group of calcifications just lateral to the lower inner quadrant group biopsied on August 11, 2021. These will need to be localized for surgical excision with the remainder of the calcifications and the distortion if breast conservation surgery is performed. Pathology results reported by Terie Purser, RN on 08/12/2021. Electronically Signed   By: Franki Cabot M.D.   On: 08/12/2021 14:40   Result Date: 08/12/2021 CLINICAL DATA:  Patient with a suspicious mass in the LEFT breast at the 9 o'clock axis presents today for ultrasound-guided biopsy. Patient also with suspicious calcifications in the lower inner quadrant of the LEFT breast presents today for stereotactic-guided biopsy. EXAM: LEFT BREAST ULTRASOUND GUIDED CORE NEEDLE BIOPSY LEFT BREAST STEREOTACTIC CORE NEEDLE BIOPSY COMPARISON:   Previous exams. FINDINGS: The patient and I discussed the procedure of ultrasound-guided and stereotactic-guided biopsies including benefits and alternatives. We discussed the high likelihood of a successful procedure. We discussed the risks of the procedure including infection, bleeding, tissue injury, clip migration, and inadequate sampling. Informed written consent was given. The usual time out protocol was performed immediately prior to the procedure. Site 1: lesion quadrant: 9 o'clock Using sterile technique and 1% Lidocaine as local anesthetic, under direct ultrasound visualization, a 12 gauge spring-loaded device was used to perform biopsy of the LEFT breast mass at the 9 o'clock axis using a lateral approach. At the conclusion of the procedure ribbon shaped tissue marker clip was deployed into the biopsy cavity. Site 2: Lesion quadrant: Lower  inner quadrant Using sterile technique and 1% Lidocaine as local anesthetic, under stereotactic guidance, a 9 gauge vacuum assisted device was used to perform core needle biopsy of calcifications in the lower outer quadrant of the left breast using a medial approach. Specimen radiograph was performed showing calcifications. Specimens with calcifications are identified for pathology. At the conclusion of the procedure, and X shaped tissue marker clip was deployed into the biopsy cavity. Follow up 2 view mammogram was performed and dictated separately. IMPRESSION: 1. Ultrasound-guided biopsy of suspicious calcifications within the lower inner quadrant of the LEFT breast. Ribbon shaped clip placed at the biopsy site. No apparent complications. 2. Stereotactic-guided biopsy of suspicious calcifications located within the lower inner quadrant of the LEFT breast. X shaped clip placed at the biopsy site. No apparent complications. Electronically Signed: By: Franki Cabot M.D. On: 08/11/2021 09:05  [place] LT BREAST BX W LOC DEV 1ST LESION IMG BX SPEC US GUIDE  Addendum Date:  08/12/2021   ADDENDUM REPORT: 08/12/2021 14:40 ADDENDUM: Pathology revealed GRADE II INVASIVE MAMMARY CARCINOMA of the LEFT breast, 9 o'clock, (ribbon clip). This was found to be concordant by Dr. Franki Cabot. 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 This was found to be concordant by Dr. Franki Cabot. Pathology results were discussed with the patient by telephone. The patient reported doing well after the biopsies with tenderness at the sites. Post biopsy instructions and care were reviewed and questions were answered. The patient was encouraged to call [facility] for any additional concerns. My direct phone number was provided. The patient was referred to [facility] on August 18, 2021. Request for RIGHT diagnostic mammogram sent to Donette Larry, [facility] Scheduling Supervisor, via secure email on August 12, 2021. The patient will be contacted by the scheduling department to arrange appointment. MRI may also be considered for extent of disease given the patient's family history. NOTE: There is a 2 mm group of calcifications just lateral to the lower inner quadrant group biopsied on August 11, 2021. These will need to be localized for surgical excision with the remainder of the calcifications and the distortion if breast conservation surgery is performed. Pathology results reported by Terie Purser, RN on 08/12/2021. Electronically Signed   By: Franki Cabot M.D.   On: 08/12/2021 14:40   Result Date: 08/12/2021 CLINICAL DATA:  Patient with a suspicious mass in the LEFT breast at the 9 o'clock axis presents today for ultrasound-guided biopsy. Patient also with suspicious calcifications in the lower inner quadrant of the LEFT breast presents today for stereotactic-guided biopsy. EXAM: LEFT BREAST ULTRASOUND  GUIDED CORE NEEDLE BIOPSY LEFT BREAST STEREOTACTIC CORE NEEDLE BIOPSY COMPARISON:  Previous exams. FINDINGS: The patient and I discussed the procedure of ultrasound-guided and stereotactic-guided biopsies including benefits and alternatives. We discussed the high likelihood of a successful procedure. We discussed the risks of the procedure including infection, bleeding, tissue injury, clip migration, and inadequate sampling. Informed written consent was given. The usual time out protocol was performed immediately prior to the procedure. Site 1: lesion quadrant: 9 o'clock Using sterile technique and 1% Lidocaine as local anesthetic, under direct ultrasound visualization, a 12 gauge spring-loaded device was used to perform biopsy of the LEFT breast mass at the 9 o'clock axis using a lateral approach. At the conclusion of the procedure ribbon shaped tissue marker clip was deployed  into the biopsy cavity. Site 2: Lesion quadrant: Lower inner quadrant Using sterile technique and 1% Lidocaine as local anesthetic, under stereotactic guidance, a 9 gauge vacuum assisted device was used to perform core needle biopsy of calcifications in the lower outer quadrant of the left breast using a medial approach. Specimen radiograph was performed showing calcifications. Specimens with calcifications are identified for pathology. At the conclusion of the procedure, and X shaped tissue marker clip was deployed into the biopsy cavity. Follow up 2 view mammogram was performed and dictated separately. IMPRESSION: 1. Ultrasound-guided biopsy of suspicious calcifications within the lower inner quadrant of the LEFT breast. Ribbon shaped clip placed at the biopsy site. No apparent complications. 2. Stereotactic-guided biopsy of suspicious calcifications located within the lower inner quadrant of the LEFT breast. X shaped clip placed at the biopsy site. No apparent complications. Electronically Signed: By: Franki Cabot M.D. On: 08/11/2021  09:05    No orders of the defined types were placed in this encounter.   All questions were answered. The patient knows to call the clinic with any problems, questions or concerns. The total time spent in the appointment was 45 minutes.     Truitt Merle, MD 08/18/2021 4:00 PM  I, Wilburn Mylar, am acting as scribe for Truitt Merle, MD.   I have reviewed the above documentation for accuracy and completeness, and I agree with the above.

## 2021-08-18 NOTE — H&P (View-Only) (Signed)
Subjective   Chief Complaint: Breast Cancer     History of Present Illness: Madison Clements is a 75 y.o. female who is seen today as an office consultation at the request of Dr. Andy for evaluation of Breast Cancer .   Breast MDC 08/18/21 Feng/ [place]  This is a 75 year old female with a family history breast cancer in her sister and a maternal aunt who presents after a recent 6 month follow-up diagnostic mammogram revealed an area of distortion in her left breast.  Further work-up revealed an invasive ductal carcinoma located at 0900 2 cmfn measuring 1.8 x 1.2 x 0.9 cm.  Immediately anterior to this mass was an area of calcifications that was also biopsied.  Both biopsies were positive.  The axilla was negative.  There is a 2 mm area of calcifications immediately lateral to the small anterior biopsied area.  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  She presents today to discuss her treatment options.  The patient has significant concerns about her right breast since it has not been imaged in about a year.  These concerns are exacerbated by the fact that she had some abnormalities 6 months ago and the decision was made for a short-term follow-up.  She is accompanied by her sister who had DCIS that was excised and treated with radiation and antiestrogens.  That was all performed at [facility] in [place].   Review of Systems: A complete review of systems was obtained from the patient.  I have reviewed this information and discussed as appropriate with the patient.  See HPI as well for other ROS.  Review of Systems  Constitutional: Negative.   HENT: Negative.   Eyes: Negative.   Respiratory: Negative.   Cardiovascular: Positive for leg swelling.  Gastrointestinal: Negative.   Genitourinary: Negative.   Musculoskeletal: Negative.   Skin: Negative.   Neurological: Negative.   Endo/Heme/Allergies:  Negative.   Psychiatric/Behavioral: Negative.       Medical History: Past Medical History:  Diagnosis Date   Arthritis    GERD (gastroesophageal reflux disease)    History of cancer    Hyperlipidemia    Hypertension    Sleep apnea     Patient Active Problem List  Diagnosis   Malignant neoplasm of upper-inner quadrant of left breast in female, estrogen receptor positive (CMS-HCC)   Mixed hyperlipidemia   Essential hypertension    Past Surgical History:  Procedure Laterality Date   APPENDECTOMY N/A    1962   Bladder Suspension N/A    1990   HYSTERECTOMY N/A    abdominal hysterectomy 1990   meniscus repair Right    2001     Allergies  Allergen Reactions   Morphine Rash and Nausea And Vomiting    Current Outpatient Medications on File Prior to Visit  Medication Sig Dispense Refill   celecoxib (CELEBREX) 100 MG capsule celecoxib 100 mg capsule     diclofenac (VOLTAREN) 1 % topical gel      diltiazem (TIAZAC) 180 MG ER capsule Take by mouth     FUROsemide (LASIX) 40 MG tablet furosemide 40 mg tablet     lisinopriL (ZESTRIL) 10 MG tablet lisinopril 10 mg tablet     omeprazole (PRILOSEC) 20 MG DR capsule omeprazole 20 mg capsule,delayed release     rosuvastatin (CRESTOR) 40 MG tablet      folic acid (FOLVITE) 400 MCG tablet Take by mouth       No current facility-administered medications on file prior to visit.    Family History  Problem Relation Age of Onset   Stroke Mother    Hyperlipidemia (Elevated cholesterol) Mother    High blood pressure (Hypertension) Mother    Obesity Father    High blood pressure (Hypertension) Father    Coronary Artery Disease (Blocked arteries around heart) Father    Obesity Sister    High blood pressure (Hypertension) Sister    Diabetes Sister    Breast cancer Sister    Breast cancer Maternal Aunt    Coronary Artery Disease (Blocked arteries around heart) Maternal Uncle    Stroke Maternal Grandmother    High blood pressure  (Hypertension) Maternal Grandmother    High blood pressure (Hypertension) Paternal Grandmother      Social History   Tobacco Use  Smoking Status Former   Types: Cigarettes   Quit date: 09/12/1996   Years since quitting: 24.9  Smokeless Tobacco Never     Social History   Socioeconomic History   Marital status: Unknown  Tobacco Use   Smoking status: Former    Types: Cigarettes    Quit date: 09/12/1996    Years since quitting: 24.9   Smokeless tobacco: Never  Vaping Use   Vaping Use: Never used  Substance and Sexual Activity   Alcohol use: Yes    Alcohol/week: 10.0 standard drinks    Types: 10 Glasses of wine per week   Drug use: Never    Objective:     Physical Exam   Constitutional:  WDWN in NAD, conversant, no obvious deformities; lying in bed comfortably Eyes:  Pupils equal, round; sclera anicteric; moist conjunctiva; no lid lag HENT:  Oral mucosa moist; good dentition  Neck:  No masses palpated, trachea midline; no thyromegaly Lungs:  CTA bilaterally; normal respiratory effort Breasts:  symmetric, no nipple changes; no palpable lymphadenopathy in either axilla, some resolving ecchymosis from her biopsies.  No palpable masses in the left medial breast.   CV:  Regular rate and rhythm; no murmurs; extremities well-perfused with no edema Abd:  +bowel sounds, soft, non-tender, no palpable organomegaly; no palpable hernias Musc:  Unable to assess gait; no apparent clubbing or cyanosis in extremities Lymphatic:  No palpable cervical or axillary lymphadenopathy Skin:  Warm, dry; no sign of jaundice Psychiatric - alert and oriented x 4; calm mood and affect   Labs, Imaging and Diagnostic Testing:  ADDITIONAL INFORMATION: 1. PROGNOSTIC INDICATORS Results: IMMUNOHISTOCHEMICAL AND MORPHOMETRIC ANALYSIS PERFORMED MANUALLY The tumor cells are NEGATIVE for Her2 (1+). Estrogen Receptor: 100%, POSITIVE, STRONG STAINING INTENSITY Progesterone Receptor: 60%, POSITIVE, STRONG  STAINING INTENSITY Proliferation Marker Ki67: 10% REFERENCE RANGE ESTROGEN RECEPTOR NEGATIVE 0% POSITIVE =>1% REFERENCE RANGE PROGESTERONE RECEPTOR NEGATIVE 0% POSITIVE =>1% All controls stained appropriately DAWN BUTLER MD Pathologist, Electronic Signature ( Signed 08/18/2021) 2. PROGNOSTIC INDICATORS Results: IMMUNOHISTOCHEMICAL AND MORPHOMETRIC ANALYSIS PERFORMED MANUALLY The tumor cells are NEGATIVE for Her2 (1+). Estrogen Receptor: 100%, POSITIVE, STRONG STAINING INTENSITY Progesterone Receptor: 60%, POSITIVE, STRONG STAINING INTENSITY 1 of 3 FINAL for Divelbiss, Veronique SYLVIA ([id number]) ADDITIONAL INFORMATION:(continued) Proliferation Marker Ki67: 15% REFERENCE RANGE ESTROGEN RECEPTOR NEGATIVE 0% POSITIVE =>1% REFERENCE RANGE PROGESTERONE RECEPTOR NEGATIVE 0% POSITIVE =>1% All controls stained appropriately DAWN BUTLER MD Pathologist, Electronic Signature ( Signed 08/18/2021  Diagnosis 1. Breast, left, needle core biopsy, 9 o'clock, ribbon clip - INVASIVE MAMMARY CARCINOMA - SEE COMMENT 2. Breast, left, needle core biopsy, lower inner quadrant, x clip - INVASIVE MAMMARY CARCINOMA - MAMMARY CARCINOMA IN-SITU - CALCIFICATIONS -   SEE COMMENT Microscopic Comment 1. The biopsy material shows an infiltrative proliferation of cells with arranged linearly and in small clusters. Based on the biopsy, the carcinoma appears Nottingham grade 2 of 3 and measures 1.2 cm in greatest linear extent. E-cadherin and prognostic markers (ER/PR/ki-67/HER2)are pending and will be reported in an addendum. Dr. Jordan reviewed the case and agrees with above diagnosis. These results were called to [facility] on August 12, 2021. 2. Based on the biopsy, the carcinoma appears Nottingham grade 2 of 3 and measures 0.2 cm in greatest linear extent. Prognostic markers (ER/PR/ki-67/HER2) are pending and will be reported in an addendum. DAWN BUTLER MD Pathologist, Electronic  Signature (Case signed 08/12/2021)  CLINICAL DATA:  Short-term follow-up for a left breast asymmetry. The patient has family history of breast cancer in her sister and a maternal aunt.   EXAM: DIGITAL DIAGNOSTIC UNILATERAL LEFT MAMMOGRAM WITH TOMOSYNTHESIS AND CAD; ULTRASOUND LEFT BREAST LIMITED   TECHNIQUE: Left digital diagnostic mammography and breast tomosynthesis was performed. The images were evaluated with computer-aided detection.; Targeted ultrasound examination of the left breast was performed.   COMPARISON:  Previous exam(s).   ACR Breast Density Category b: There are scattered areas of fibroglandular density.   FINDINGS: Spot compression tomosynthesis images through the region of the asymmetry in the medial middle depth of the left breast demonstrates a persistent asymmetry with some associated distortion. There are a few calcifications in a linear array leading from the asymmetry to the anteromedial aspect of the left breast measuring approximately 1.0 cm. The distortion in association with the calcifications spans approximately 3.6 cm. Approximately 1 cm lateral to the linear calcifications is a faint 2 mm group of calcifications.   Ultrasound targeted to the left breast at 9 o'clock, 2 cm from the nipple demonstrates an ill-defined area of shadowing measuring 1.8 x 0.9 x 1.2 cm. Ultrasound of the left axilla demonstrates multiple normal-appearing lymph nodes.   IMPRESSION: 1. There is a suspicious mass in the left breast at 9 o'clock measuring 1.8 cm.   2. There is a 1.0 cm group of suspicious linear calcifications anterior to the suspicious mass in the left breast, as well as a faint 2 mm group which lie 1 cm lateral to the linear calcifications.   3.  No evidence of left axillary lymphadenopathy.   RECOMMENDATION: 1. Ultrasound-guided biopsy is recommended for the left breast mass at 9 o'clock.   2. Stereotactic biopsy is recommended for the  calcifications in the medial left breast. The ultrasound and stereotactic biopsy procedures have been scheduled for 08/11/2021 at 7:30 a.m.   3. Note that there is a 2 mm group of calcifications just lateral to the group recommended for biopsy. These will need to be localized for surgical excision with the remainder of the calcifications and the distortion.   4. Depending on the pathology results, diagnostic mammogram of the right breast may be recommended for screening as her prior mammogram was performed in May of 2022. MRI may also be considered for extent of disease given the patient's family history.   I have discussed the findings and recommendations with the patient. If applicable, a reminder letter will be sent to the patient regarding the next appointment.   BI-RADS CATEGORY  5: Highly suggestive of malignancy.     Electronically Signed   By: Michelle  Collins M.D.   On: 07/27/2021 13:37   CLINICAL DATA:  Status post ultrasound-guided and stereotactic biopsies today.   EXAM: 3D DIAGNOSTIC   LEFT MAMMOGRAM POST ULTRASOUND AND STEREOTACTIC BIOPSIES   COMPARISON:  Previous exam(s).   FINDINGS: 3D Mammographic images were obtained following ultrasound guided biopsy of a suspicious mass within the LEFT breast at the 9 o'clock axis followed by stereotactic biopsy of suspicious calcifications in the lower inner quadrant of the LEFT breast. The biopsy marking clips are in expected position at the sites of biopsy.   IMPRESSION: 1. Appropriate positioning of the ribbon shaped biopsy marking clip at the site of biopsy in the inner LEFT breast corresponding to the targeted mass at the 9 o'clock axis. 2. Appropriate positioning of the X shaped biopsy marking clip at the site of biopsy in the lower inner quadrant of the LEFT breast, 5 mm lateral to the biopsied calcifications.   Final Assessment: Post Procedure Mammograms for Marker Placement     Electronically Signed    By: Stan  Maynard M.D.   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  We will first obtain cardiac clearance from Dr. Tracy Turner.  To alleviate her significant anxiety about possible undiagnosed problems in the right breast as well as concerns about density in both breasts, we will first obtain breast MRI.  Assuming this is negative then we will proceed with our surgical plan.  Left radioactive seed localized lumpectomy x 2.  The third area of calcifications is immediately lateral to the anterior area of calcifications.  Radiology feels that they will be able to localize this with a radioactive seed between the 2 areas.  The surgical procedure has been discussed with the patient.  Potential risks, benefits, alternative treatments, and expected outcomes have been explained.  All of the patient's questions at this time have been answered.  The likelihood of reaching the patient's treatment goal is good.  The patient understand the proposed surgical procedure and wishes to proceed.  After further discussion in multi-disciplinary conference, there is no strong indication for axillary sentinel lymph node biopsy.  She is a good candidate for breast conserving therapy with lumpectomies for clear margins, followed by radiation and possible adjuvant anti-estrogens.  Genetic testing due to her family history  Tarryn Bogdan KAI Avril Busser, MD  08/18/2021 3:30 PM   I had direct face-to-face contact with the patient for a total of 65 minutes and greater than 50% of that time was spent providing counseling and/or coordination of care for the patient regarding left breast cancer.   

## 2021-08-18 NOTE — Progress Notes (Signed)
[facility] Psychosocial Distress Screening Spiritual Care  Met with Madison Clements and her sister [name] in [facility] to introduce [facility] team/resources, reviewing distress screen per protocol.  The patient scored a 7 on the Psychosocial Distress Thermometer which indicates severe distress. Also assessed for distress and other psychosocial needs.   ON[facility] DISTRESS SCREENING 08/18/2021  Screening Type Initial Screening  Distress experienced in past week (1-10) 7  Emotional problem type Nervousness/Anxiety  Information Concerns Type Lack of info about diagnosis;Lack of info about treatment  Physical Problem type Sleep/insomnia  Referral to support programs Yes   [name] reports some remaining jitteriness/anxiety, but overall feeling a sense of peace about her situation, now that she has met her team and learned her treatment plan. She reports that [facility]'s resolving the unknown has helped reduce her distress and notes that she has strong faith.   Follow up needed: No. [name] is aware of ongoing [facility] team and programming availability and plans to reach out as needed/desired.   [place], [place], [facility] Pager [phone] Voicemail [phone]

## 2021-08-18 NOTE — H&P (Signed)
Subjective   Chief Complaint: Breast Cancer     History of Present Illness: Madison Clements is a 75 y.o. female who is seen today as an office consultation at the request of Dr. Jonni Sanger for evaluation of Breast Cancer .   Breast MDC 08/18/21 Feng/ Isidore Moos  This is a 75 year old female with a family history breast cancer in her sister and a maternal aunt who presents after a recent 6 month follow-up diagnostic mammogram revealed an area of distortion in her left breast.  Further work-up revealed an invasive ductal carcinoma located at 0900 2 cmfn measuring 1.8 x 1.2 x 0.9 cm.  Immediately anterior to this mass was an area of calcifications that was also biopsied.  Both biopsies were positive.  The axilla was negative.  There is a 2 mm area of calcifications immediately lateral to the small anterior biopsied area.  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  She presents today to discuss her treatment options.  The patient has significant concerns about her right breast since it has not been imaged in about a year.  These concerns are exacerbated by the fact that she had some abnormalities 6 months ago and the decision was made for a short-term follow-up.  She is accompanied by her sister who had DCIS that was excised and treated with radiation and antiestrogens.  That was all performed at [facility] in [place].   Review of Systems: A complete review of systems was obtained from the patient.  I have reviewed this information and discussed as appropriate with the patient.  See HPI as well for other ROS.  Review of Systems  Constitutional: Negative.   HENT: Negative.   Eyes: Negative.   Respiratory: Negative.   Cardiovascular: Positive for leg swelling.  Gastrointestinal: Negative.   Genitourinary: Negative.   Musculoskeletal: Negative.   Skin: Negative.   Neurological: Negative.   Endo/Heme/Allergies:  Negative.   Psychiatric/Behavioral: Negative.       Medical History: Past Medical History:  Diagnosis Date   Arthritis    GERD (gastroesophageal reflux disease)    History of cancer    Hyperlipidemia    Hypertension    Sleep apnea     Patient Active Problem List  Diagnosis   Malignant neoplasm of upper-inner quadrant of left breast in female, estrogen receptor positive (CMS-HCC)   Mixed hyperlipidemia   Essential hypertension    Past Surgical History:  Procedure Laterality Date   APPENDECTOMY N/A    1962   Bladder Suspension N/A    1990   HYSTERECTOMY N/A    abdominal hysterectomy 1990   meniscus repair Right    2001     Allergies  Allergen Reactions   Morphine Rash and Nausea And Vomiting    Current Outpatient Medications on File Prior to Visit  Medication Sig Dispense Refill   celecoxib (CELEBREX) 100 MG capsule celecoxib 100 mg capsule     diclofenac (VOLTAREN) 1 % topical gel      diltiazem (TIAZAC) 180 MG ER capsule Take by mouth     FUROsemide (LASIX) 40 MG tablet furosemide 40 mg tablet     lisinopriL (ZESTRIL) 10 MG tablet lisinopril 10 mg tablet     omeprazole (PRILOSEC) 20 MG DR capsule omeprazole 20 mg capsule,delayed release     rosuvastatin (CRESTOR) 40 MG tablet      folic acid (FOLVITE) 426 MCG tablet Take by mouth  No current facility-administered medications on file prior to visit.    Family History  Problem Relation Age of Onset   Stroke Mother    Hyperlipidemia (Elevated cholesterol) Mother    High blood pressure (Hypertension) Mother    Obesity Father    High blood pressure (Hypertension) Father    Coronary Artery Disease (Blocked arteries around heart) Father    Obesity Sister    High blood pressure (Hypertension) Sister    Diabetes Sister    Breast cancer Sister    Breast cancer Maternal Aunt    Coronary Artery Disease (Blocked arteries around heart) Maternal Uncle    Stroke Maternal Grandmother    High blood pressure  (Hypertension) Maternal Grandmother    High blood pressure (Hypertension) Paternal Grandmother      Social History   Tobacco Use  Smoking Status Former   Types: Cigarettes   Quit date: 09/12/1996   Years since quitting: 24.9  Smokeless Tobacco Never     Social History   Socioeconomic History   Marital status: Unknown  Tobacco Use   Smoking status: Former    Types: Cigarettes    Quit date: 09/12/1996    Years since quitting: 24.9   Smokeless tobacco: Never  Vaping Use   Vaping Use: Never used  Substance and Sexual Activity   Alcohol use: Yes    Alcohol/week: 10.0 standard drinks    Types: 10 Glasses of wine per week   Drug use: Never    Objective:     Physical Exam   Constitutional:  WDWN in NAD, conversant, no obvious deformities; lying in bed comfortably Eyes:  Pupils equal, round; sclera anicteric; moist conjunctiva; no lid lag HENT:  Oral mucosa moist; good dentition  Neck:  No masses palpated, trachea midline; no thyromegaly Lungs:  CTA bilaterally; normal respiratory effort Breasts:  symmetric, no nipple changes; no palpable lymphadenopathy in either axilla, some resolving ecchymosis from her biopsies.  No palpable masses in the left medial breast.   CV:  Regular rate and rhythm; no murmurs; extremities well-perfused with no edema Abd:  +bowel sounds, soft, non-tender, no palpable organomegaly; no palpable hernias Musc:  Unable to assess gait; no apparent clubbing or cyanosis in extremities Lymphatic:  No palpable cervical or axillary lymphadenopathy Skin:  Warm, dry; no sign of jaundice Psychiatric - alert and oriented x 4; calm mood and affect   Labs, Imaging and Diagnostic Testing:  ADDITIONAL INFORMATION: 1. PROGNOSTIC INDICATORS Results: IMMUNOHISTOCHEMICAL AND MORPHOMETRIC ANALYSIS PERFORMED MANUALLY The tumor cells are NEGATIVE for Her2 (1+). Estrogen Receptor: 100%, POSITIVE, STRONG STAINING INTENSITY Progesterone Receptor: 60%, POSITIVE, STRONG  STAINING INTENSITY Proliferation Marker Ki67: 10% REFERENCE RANGE ESTROGEN RECEPTOR NEGATIVE 0% POSITIVE =>1% REFERENCE RANGE PROGESTERONE RECEPTOR NEGATIVE 0% POSITIVE =>1% All controls stained appropriately Thressa Sheller MD Pathologist, Electronic Signature ( Signed 08/18/2021) 2. PROGNOSTIC INDICATORS Results: IMMUNOHISTOCHEMICAL AND MORPHOMETRIC ANALYSIS PERFORMED MANUALLY The tumor cells are NEGATIVE for Her2 (1+). Estrogen Receptor: 100%, POSITIVE, STRONG STAINING INTENSITY Progesterone Receptor: 60%, POSITIVE, STRONG STAINING INTENSITY 1 of 3 FINAL for Madison Clements, Madison Clements ([id number]) ADDITIONAL INFORMATION:(continued) Proliferation Marker Ki67: 15% REFERENCE RANGE ESTROGEN RECEPTOR NEGATIVE 0% POSITIVE =>1% REFERENCE RANGE PROGESTERONE RECEPTOR NEGATIVE 0% POSITIVE =>1% All controls stained appropriately Thressa Sheller MD Pathologist, Electronic Signature ( Signed 08/18/2021  Diagnosis 1. Breast, left, needle core biopsy, 9 o'clock, ribbon clip - INVASIVE MAMMARY CARCINOMA - SEE COMMENT 2. Breast, left, needle core biopsy, lower inner quadrant, x clip - INVASIVE MAMMARY CARCINOMA - MAMMARY CARCINOMA IN-SITU - CALCIFICATIONS -  SEE COMMENT Microscopic Comment 1. The biopsy material shows an infiltrative proliferation of cells with arranged linearly and in small clusters. Based on the biopsy, the carcinoma appears Nottingham grade 2 of 3 and measures 1.2 cm in greatest linear extent. E-cadherin and prognostic markers (ER/PR/ki-67/HER2)are pending and will be reported in an addendum. Dr. Martinique reviewed the case and agrees with above diagnosis. These results were called to [facility] on August 12, 2021. 2. Based on the biopsy, the carcinoma appears Nottingham grade 2 of 3 and measures 0.2 cm in greatest linear extent. Prognostic markers (ER/PR/ki-67/HER2) are pending and will be reported in an addendum. Thressa Sheller MD Pathologist, Electronic  Signature (Case signed 08/12/2021)  CLINICAL DATA:  Short-term follow-up for a left breast asymmetry. The patient has family history of breast cancer in her sister and a maternal aunt.   EXAM: DIGITAL DIAGNOSTIC UNILATERAL LEFT MAMMOGRAM WITH TOMOSYNTHESIS AND CAD; ULTRASOUND LEFT BREAST LIMITED   TECHNIQUE: Left digital diagnostic mammography and breast tomosynthesis was performed. The images were evaluated with computer-aided detection.; Targeted ultrasound examination of the left breast was performed.   COMPARISON:  Previous exam(s).   ACR Breast Density Category b: There are scattered areas of fibroglandular density.   FINDINGS: Spot compression tomosynthesis images through the region of the asymmetry in the medial middle depth of the left breast demonstrates a persistent asymmetry with some associated distortion. There are a few calcifications in a linear array leading from the asymmetry to the anteromedial aspect of the left breast measuring approximately 1.0 cm. The distortion in association with the calcifications spans approximately 3.6 cm. Approximately 1 cm lateral to the linear calcifications is a faint 2 mm group of calcifications.   Ultrasound targeted to the left breast at 9 o'clock, 2 cm from the nipple demonstrates an ill-defined area of shadowing measuring 1.8 x 0.9 x 1.2 cm. Ultrasound of the left axilla demonstrates multiple normal-appearing lymph nodes.   IMPRESSION: 1. There is a suspicious mass in the left breast at 9 o'clock measuring 1.8 cm.   2. There is a 1.0 cm group of suspicious linear calcifications anterior to the suspicious mass in the left breast, as well as a faint 2 mm group which lie 1 cm lateral to the linear calcifications.   3.  No evidence of left axillary lymphadenopathy.   RECOMMENDATION: 1. Ultrasound-guided biopsy is recommended for the left breast mass at 9 o'clock.   2. Stereotactic biopsy is recommended for the  calcifications in the medial left breast. The ultrasound and stereotactic biopsy procedures have been scheduled for 08/11/2021 at 7:30 a.m.   3. Note that there is a 2 mm group of calcifications just lateral to the group recommended for biopsy. These will need to be localized for surgical excision with the remainder of the calcifications and the distortion.   4. Depending on the pathology results, diagnostic mammogram of the right breast may be recommended for screening as her prior mammogram was performed in May of 2022. MRI may also be considered for extent of disease given the patient's family history.   I have discussed the findings and recommendations with the patient. If applicable, a reminder letter will be sent to the patient regarding the next appointment.   BI-RADS CATEGORY  5: Highly suggestive of malignancy.     Electronically Signed   By: Ammie Ferrier M.D.   On: 07/27/2021 13:37   CLINICAL DATA:  Status post ultrasound-guided and stereotactic biopsies today.   EXAM: 3D DIAGNOSTIC  LEFT MAMMOGRAM POST ULTRASOUND AND STEREOTACTIC BIOPSIES   COMPARISON:  Previous exam(s).   FINDINGS: 3D Mammographic images were obtained following ultrasound guided biopsy of a suspicious mass within the LEFT breast at the 9 o'clock axis followed by stereotactic biopsy of suspicious calcifications in the lower inner quadrant of the LEFT breast. The biopsy marking clips are in expected position at the sites of biopsy.   IMPRESSION: 1. Appropriate positioning of the ribbon shaped biopsy marking clip at the site of biopsy in the inner LEFT breast corresponding to the targeted mass at the 9 o'clock axis. 2. Appropriate positioning of the X shaped biopsy marking clip at the site of biopsy in the lower inner quadrant of the LEFT breast, 5 mm lateral to the biopsied calcifications.   Final Assessment: Post Procedure Mammograms for Marker Placement     Electronically Signed    By: Franki Cabot M.D.   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  We will first obtain cardiac clearance from Dr. Golden Hurter.  To alleviate her significant anxiety about possible undiagnosed problems in the right breast as well as concerns about density in both breasts, we will first obtain breast MRI.  Assuming this is negative then we will proceed with our surgical plan.  Left radioactive seed localized lumpectomy x 2.  The third area of calcifications is immediately lateral to the anterior area of calcifications.  Radiology feels that they will be able to localize this with a radioactive seed between the 2 areas.  The surgical procedure has been discussed with the patient.  Potential risks, benefits, alternative treatments, and expected outcomes have been explained.  All of the patient's questions at this time have been answered.  The likelihood of reaching the patient's treatment goal is good.  The patient understand the proposed surgical procedure and wishes to proceed.  After further discussion in multi-disciplinary conference, there is no strong indication for axillary sentinel lymph node biopsy.  She is a good candidate for breast conserving therapy with lumpectomies for clear margins, followed by radiation and possible adjuvant anti-estrogens.  Genetic testing due to her family history  MATTHEW Jearld Adjutant, MD  08/18/2021 3:30 PM   I had direct face-to-face contact with the patient for a total of 65 minutes and greater than 50% of that time was spent providing counseling and/or coordination of care for the patient regarding left breast cancer.

## 2021-08-18 NOTE — Progress Notes (Signed)
REFERRING PROVIDER: Truitt Merle, MD [address] [place],  [place] [address]  PRIMARY PROVIDER:  Leamon Arnt, MD  PRIMARY REASON FOR VISIT:  Encounter Diagnoses  Name Primary?   Malignant neoplasm of overlapping sites of left breast in female, estrogen receptor positive ([place]) Yes   History of melanoma    Family history of breast cancer    Family history of pancreatic cancer    Family history of prostate cancer     HISTORY OF PRESENT ILLNESS:   Madison Clements, a 75 y.o. female, was seen for a  cancer genetics consultation during the breast multidisciplinary clinic at the request of Dr. Burr Medico due to a personal and family history of cancer.  Madison Clements presents to clinic today to discuss the possibility of a hereditary predisposition to cancer, to discuss genetic testing, and to further clarify her future cancer risks, as well as potential cancer risks for family members.   In 2022, at the age of 45, Madison Clements was diagnosed with invasive mammary carcinoma (ER+/PR+/HER2-). The treatment plan is pending.  Madison Clements also has a history of melanoma diagnosed in 2012 and 2013 on her abdomen s/p wide local excision.   CANCER HISTORY:  Oncology History Overview Note   Cancer Staging  Malignant neoplasm of overlapping sites of left breast in female, estrogen receptor positive ([place]) Staging form: Breast, AJCC 8th Edition - Clinical stage from 08/11/2021: cT1c, cN0, cM0, G2 - Signed by Truitt Merle, MD on 08/17/2021    Malignant neoplasm of overlapping sites of left breast in female, estrogen receptor positive ([place])  07/27/2021 Mammogram   EXAM: DIGITAL DIAGNOSTIC UNILATERAL LEFT MAMMOGRAM WITH TOMOSYNTHESIS AND CAD; ULTRASOUND LEFT BREAST LIMITED  IMPRESSION: 1. There is a suspicious mass in the left breast at 9 o'clock measuring 1.8 cm.   2. There is a 1.0 cm group of suspicious linear calcifications anterior to the suspicious mass in the left breast, as well as a faint 2  mm group which lie 1 cm lateral to the linear calcifications.   3.  No evidence of left axillary lymphadenopathy   08/11/2021 Cancer Staging   Staging form: Breast, AJCC 8th Edition - Clinical stage from 08/11/2021: cT1c, cN0, cM0, G2 - Signed by Truitt Merle, MD on 08/17/2021 Stage prefix: Initial diagnosis Histologic grading system: 3 grade system    08/11/2021 Initial Biopsy   Diagnosis 1. Breast, left, needle core biopsy, 9 o'clock, ribbon clip - INVASIVE MAMMARY CARCINOMA - SEE COMMENT 2. Breast, left, needle core biopsy, lower inner quadrant, x clip - INVASIVE MAMMARY CARCINOMA - MAMMARY CARCINOMA IN-SITU - CALCIFICATIONS - SEE COMMENT Microscopic Comment 1. The biopsy material shows an infiltrative proliferation of cells with arranged linearly and in small clusters. Based on the biopsy, the carcinoma appears Nottingham grade 2 of 3 and measures 1.2 cm in greatest linear extent. 2. Based on the biopsy, the carcinoma appears Nottingham grade 2 of 3 and measures 0.2 cm in greatest linear extent.  1. PROGNOSTIC INDICATORS Results: The tumor cells are NEGATIVE for Her2 (1+). Estrogen Receptor: 100%, POSITIVE, STRONG STAINING INTENSITY Progesterone Receptor: 60%, POSITIVE, STRONG STAINING INTENSITY Proliferation Marker Ki67: 10%  2. PROGNOSTIC INDICATORS Results: The tumor cells are NEGATIVE for Her2 (1+). Estrogen Receptor: 100%, POSITIVE, STRONG STAINING INTENSITY Progesterone Receptor: 60%, POSITIVE, STRONG STAINING INTENSITY Proliferation Marker Ki67: 15%   08/13/2021 Mammogram   EXAM: DIGITAL DIAGNOSTIC UNILATERAL RIGHT MAMMOGRAM WITH TOMOSYNTHESIS AND CAD  IMPRESSION: No mammographic evidence for malignancy.   08/16/2021 Initial Diagnosis  Malignant neoplasm of overlapping sites of left breast in female, estrogen receptor positive ([place])     RISK FACTORS:  Menarche was at age 15.  First live birth at age 64.  OCP use for approximately 5 years.  Ovaries  intact: no.  Hysterectomy: yes.  Menopausal status: postmenopausal.  HRT use: 12 years; stopped in 2002 Colonoscopy: yes;  most recent in 2021 . Mammogram within the last year: yes.   Past Medical History:  Diagnosis Date   Allergy 1990   Morphine following surgery   Arthritis    generalized   Atypical mole 02/24/2015   RGHT LATERAL THIGH MODERATE   Atypical nevi 02/24/2015   RIGHT NECK MILD   Atypical nevi 08/25/2015   LEFT UPPER ARM MILD   Atypical nevi 08/25/2015   LEFT LATERAL FOREARM MILD/FREE   Atypical nevi 02/23/2016   LEFT THIGH MODERATE/FREE   Atypical nevi 02/23/2016   LEFT MEDIAL LEG MODERATE/FREE   Atypical nevi 02/23/2016   LEFT UPPER BACK MILD /FREE   Atypical nevi 08/23/2016   RIGHT ANT PROX THIGH MILD /FREE   Atypical nevi 07/25/2018   MID LOWER BACK MODERATE   Atypical nevi 01/22/2019   LEFT MID BACK MILD /FREE   Atypical nevi 01/22/2019   LEFT NECK MILD   Atypical nevi 08/06/2019   LEFT INNER BREAST MODERATE W/S   Atypical nevi 08/06/2019   LEFT OUTER SIDE MILD/FREE   Blood transfusion without reported diagnosis    hx of   Breast cancer ([place]) 2022   CAD (coronary artery disease), native coronary artery 02/07/2019   Minimal CAD at the time of cath in 2016 in [place] liver 05/21/2020   Gastric polyps 10/02/2018   EGD 03/2015; benign   GERD (gastroesophageal reflux disease) 10/02/2018   on meds   History of melanoma 10/02/2018   Abdomen 2012; 2013; s/p local excisions.    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 DISTAL LOWER LEG TREATED BY DR. Delton Coombes   Sleep apnea    Sleep apnea 05/26/2020   uses CPAP    Past Surgical History:  Procedure Laterality Date   [place]   COLONOSCOPY  2016   in [place]-hx of polyps   EYE SURGERY  2013 torn retina laser   MENISCUS REPAIR Right 2001   right knee   thumb surgery Left    TONSILLECTOMY AND ADENOIDECTOMY     tuabl ligation     TUBAL LIGATION  1975   WISDOM TOOTH EXTRACTION      FAMILY HISTORY:  We obtained a detailed, 4-generation family history.  Significant diagnoses are listed below: Family History  Problem Relation Age of Onset   Breast cancer Sister 46   Pancreatic cancer Maternal Aunt        dx early 41s   Prostate cancer Cousin        paternal cousins x2; dx after 72     It was reported that Madison Clements's niece had negative BRCA testing.  No report was available for review today. Madison Clements is unaware of other previous family history of genetic testing for hereditary cancer risks. There is  no reported Ashkenazi Jewish ancestry. There is no known consanguinity.  GENETIC COUNSELING ASSESSMENT: Madison Clements is a 75 y.o. female with a personal and family history of cancer which is somewhat suggestive of a hereditary cancer syndrome and predisposition to cancer given the presence of related cancers in the family. We, therefore, discussed and recommended the following at today's visit.   DISCUSSION: We discussed that 5 - 10% of cancer is hereditary, with most cases of hereditary breast associated with mutations in BRCA1/2.  There are other genes that can be associated with hereditary breast, prostate, or pancreatic cancer syndromes.  We discussed that testing is beneficial for several reasons including knowing how to follow individuals after completing their treatment, identifying whether potential treatment options would be beneficial, and understanding if other family members could be at risk for cancer and allowing them to undergo genetic testing.   We reviewed the characteristics, features and inheritance patterns of hereditary cancer syndromes. We also discussed  genetic testing, including the appropriate family members to test, the process of testing, insurance coverage and turn-around-time for results. We discussed the implications of a negative, positive, carrier and/or variant of uncertain significant result. We recommended Madison Clements pursue genetic testing for a panel that includes genes associated with breast, pancreatic, and prostate cancer.   Madison Clements  was offered a common hereditary cancer panel (47 genes) and an expanded pan-cancer panel (77 genes). Madison Clements was informed of the benefits and limitations of each panel, including that expanded pan-cancer panels contain genes that do not have clear management guidelines at this point in time.  We also discussed that as the number of genes included on a panel increases, the chances of variants of uncertain significance increases.  After considering the benefits and limitations of each gene panel, Madison Clements  elected to have an expanded Radio broadcast assistant through [place].  The CancerNext-Expanded gene panel offered by [facility] and includes sequencing, rearrangement, and RNA analysis for the following 77 genes: AIP, ALK, APC, ATM, AXIN2, BAP1, BARD1, BLM, BMPR1A, BRCA1, BRCA2, BRIP1, CDC73, CDH1, CDK4, CDKN1B, CDKN2A, CHEK2, CTNNA1, DICER1, FANCC, FH, FLCN, GALNT12, KIF1B, LZTR1, MAX, MEN1, MET, MLH1, MSH2, MSH3, MSH6, MUTYH, NBN, NF1, NF2, NTHL1, PALB2, PHOX2B, PMS2, POT1, PRKAR1A, PTCH1, PTEN, RAD51C, RAD51D, RB1, RECQL, RET, SDHA, SDHAF2, SDHB, SDHC, SDHD, SMAD4, SMARCA4, SMARCB1, SMARCE1, STK11, SUFU, TMEM127, TP53, TSC1, TSC2, VHL and XRCC2 (sequencing and deletion/duplication); EGFR, EGLN1, HOXB13, KIT, MITF, PDGFRA, POLD1, and POLE (sequencing only); EPCAM and GREM1 (deletion/duplication only).   Madison Clements requested that her requested her results be available prior to surgery.  Thus, we ordered [facility] BRCAPlus Panel with STAT processing with reflex to the above panel.  The BRCAplus panel offered by  [employer] and includes sequencing and deletion/duplication analysis for the following 8 genes: ATM, BRCA1, BRCA2, CDH1, CHEK2, PALB2, PTEN, and TP53.  Based on Madison Clements's personal and family history of cancer, she meets medical criteria for genetic testing. Despite that she meets criteria, she may still have an out of pocket cost. We discussed that if her out of pocket cost for testing is over $100, the laboratory should contact her to discuss self-pay options and/or patient pay assistance programs.   PLAN: After considering the risks, benefits, and limitations, Madison Clements provided informed consent to pursue genetic testing and the blood sample was sent to [facility] for analysis of the BRCAPlus and CancerNext-Expanded +RNAinsight Panel. Results should be available within approximately 1-2 weeks' time, at which point they will be disclosed  by telephone to Madison Clements, as will any additional recommendations warranted by these results. Madison Clements will receive a summary of her genetic counseling visit and a copy of her results once available. This information will also be available in Epic.   Based on Madison Clements family history, we recommended her sister, who was diagnosed with breast cancer in her 38s, have genetic counseling and testing. Madison Clements will let us know if we can be of any assistance in coordinating genetic counseling and/or testing for this family member.   Lastly, we encouraged Madison Clements. [place] to remain in contact with cancer genetics annually so that we can continuously update the family history and inform her of any changes in cancer genetics and testing that may be of benefit for this family.   Madison Clements. Pendergraph questions were answered to her satisfaction today. Our contact information was provided should additional questions or concerns arise. Thank you for the referral and allowing [place] to share in the care of your patient.   Domenik Trice M. Joette Catching, [place], [facility] Genetic  Counselor [email or web address] (P) [phone]  The patient was seen for a total of 20 minutes in face-to-face genetic counseling.  The patient was accompanied by her sister, [name].  Her daughter, Estill Bamberg, joined by telephone. Drs. Magrinat, Lindi Adie and/or Burr Medico were available to discuss this case as needed.    _______________________________________________________________________ For Office Staff:  Number of people involved in session: 3 Was an Intern/ student involved with case: no

## 2021-08-18 NOTE — Telephone Encounter (Signed)
   [facility] Pre-operative Risk Assessment    Patient Name: Kylyn Mcdade  DOB: July 25, 1946 MRN: [id number]  HEARTCARE STAFF:  - IMPORTANT!!!!!! Under Visit Info/Reason for Call, type in Other and utilize the format Clearance MM/DD/YY or Clearance TBD. Do not use dashes or single digits. - Please review there is not already an duplicate clearance open for this procedure. - If request is for dental extraction, please clarify the # of teeth to be extracted. - If the patient is currently at the dentist's office, call Pre-Op Callback Staff (MA/nurse) to input urgent request.  - If the patient is not currently in the dentist office, please route to the Pre-Op pool.  Request for surgical clearance:  What type of surgery is being performed?  BREAST LUMPECTOMY X'S 2  When is this surgery scheduled?  TBD BUT URGENT  What type of clearance is required (medical clearance vs. Pharmacy clearance to hold med vs. Both)?  MEDICAL  Are there any medications that need to be held prior to surgery and how long?  N/A  Practice name and name of physician performing surgery?  [facility] / DR. Georgette Dover  What is the office phone number?  [phone]   7.   What is the office fax number?  [phone] ATTN:  Mammie Lorenzo / [place] HALE  8.   Anesthesia type (None, local, MAC, general) ?  GENERAL   Jeanann Lewandowsky 08/18/2021, 4:27 PM  _________________________________________________________________   (provider comments below)

## 2021-08-19 ENCOUNTER — Encounter: Payer: Self-pay | Admitting: Genetic Counselor

## 2021-08-19 DIAGNOSIS — Z800 Family history of malignant neoplasm of digestive organs: Secondary | ICD-10-CM | POA: Insufficient documentation

## 2021-08-19 DIAGNOSIS — Z803 Family history of malignant neoplasm of breast: Secondary | ICD-10-CM

## 2021-08-19 HISTORY — DX: Family history of malignant neoplasm of breast: Z80.3

## 2021-08-19 HISTORY — DX: Family history of malignant neoplasm of digestive organs: Z80.0

## 2021-08-19 NOTE — Telephone Encounter (Signed)
   Name: Analisa Sledd  DOB: 07-Jan-1946  MRN: [id number]   Primary Cardiologist: Fransico Him, MD  Chart reviewed as part of pre-operative protocol coverage. Patient was contacted 08/19/2021 in reference to pre-operative risk assessment for pending surgery as outlined below.  Der Aaminah Forrester was last seen on 05/11/21 by Dr. Radford Pax with pertinent cardiac hx to include mild pulm HTN, reported mild CAD 2016, HTN, HLD, OSA. Nuclear stress test 03/2020 was normal. Last echo felt to be stable 04/2021 with normal EF, grade 1 DD, mildly elevated PASP, mild TR. Improvement in RVSP from prior was noted. RCRI 0.4% indicating low CV risk. Not on blood thinners per our MAR. I reached out to patient for update on how she is doing. The patient affirms she has been doing well without any new cardiac symptoms. Able to exert > 4 METs (ie stairs, walking, housework) without any anginal symptoms. Therefore, based on ACC/AHA guidelines, the patient would be at acceptable risk for the planned procedure without further cardiovascular testing. The patient was advised that if she develops new symptoms prior to surgery to contact our office to arrange for a follow-up visit, and she verbalized understanding.  I will route this recommendation to the requesting party via Epic fax function and remove from pre-op pool. Please call with questions.  Charlie Pitter, PA-C 08/19/2021, 2:31 PM

## 2021-08-23 ENCOUNTER — Other Ambulatory Visit: Payer: Self-pay | Admitting: Surgery

## 2021-08-23 DIAGNOSIS — C50912 Malignant neoplasm of unspecified site of left female breast: Secondary | ICD-10-CM

## 2021-08-23 NOTE — Progress Notes (Signed)
Chronic Care Management Pharmacy Note  08/24/2021 Name:  Madison Clements MRN:  [id number] DOB:  09-01-1946  Recommendations/Changes made from today's visit:  Patient having some anxiety about procedures related to Breast Cancer.  She is also having trouble sleeping occasionally.  Consider additional Xanax prn for these issues.  Oncologist discussed Effexor, however, I was concerned about initial worsening of anxiety with this. _____________  Subjective: Madison Clements is an 75 y.o. year old female who is a primary patient of Leamon Arnt, MD.  The CCM team was consulted for assistance with disease management and care coordination needs.    Engaged with patient face to face for follow up visit in response to provider referral for pharmacy case management and/or care coordination services.   Consent to Services:  The patient was given the following information about Chronic Care Management services today, agreed to services, and gave verbal consent: 1. CCM service includes personalized support from designated clinical staff supervised by the primary care provider, including individualized plan of care and coordination with other care providers 2. 24/7 contact phone numbers for assistance for urgent and routine care needs. 3. Service will only be billed when office clinical staff spend 20 minutes or more in a month to coordinate care. 4. Only one practitioner may furnish and bill the service in a calendar month. 5.The patient may stop CCM services at any time (effective at the end of the month) by phone call to the office staff. 6. The patient will be responsible for cost sharing (co-pay) of up to 20% of the service fee (after annual deductible is met). Patient agreed to services and consent obtained.  Patient Care Team: Leamon Arnt, MD as PCP - General (Family Medicine) Sueanne Margarita, MD as PCP - Cardiology (Cardiology) Sueanne Margarita, MD as Consulting Physician  (Cardiology) Hayden Pedro, MD as Consulting Physician (Ophthalmology) Warren Danes, PA-C as Physician Assistant (Dermatology) Donnie Mesa, MD as Consulting Physician (General Surgery) Truitt Merle, MD as Consulting Physician (Hematology) Eppie Gibson, MD as Attending Physician (Radiation Oncology) Mauro Kaufmann, RN as Oncology Nurse Navigator Rockwell Germany, RN as Oncology Nurse Navigator Edythe Clarity, [facility] (Pharmacist)  Recent office visits:  02/04/21- Billey Chang, MD- chronic conditions addressed, started cyclobenzaprine 5-10 mg at bedtime prn, follow up 6 months 11/09/20- Billey Chang, MD- seen for dysequilibrium, started meclizine 25 mg tid prn, imaging ordered, follow up as needed 10/09/20- Billey Chang, MD- started diclofenac sodium 2 gm qid prn, seen for trochanteric bursitis of both hips and acute pain of right shoulder, steroid injection administered, follow up as scheduled   Recent consult visits:  08/18/21 Burr Medico, oncology) - breast cancer diagnosis recently started on Effexor for nerves and anxiety about the whole process.  01/26/22Marshell Garfinkel, MD (Pulmonology)- seen for follow-up of pulmonary hypertension, no medication changes, follow up 3 months    Hospital visits:  None in previous 6 months  Objective:  Lab Results  Component Value Date   CREATININE 0.89 08/18/2021   CREATININE 0.93 07/09/2021   CREATININE 1.01 (H) 07/28/2020    Lab Results  Component Value Date   HGBA1C 5.4 07/28/2020   Last diabetic Eye exam: No results found for: HMDIABEYEEXA  Last diabetic Foot exam: No results found for: HMDIABFOOTEX      Component Value Date/Time   CHOL 141 07/09/2021 0935   TRIG 135.0 07/09/2021 0935   HDL 48.20 07/09/2021 0935   CHOLHDL 3 07/09/2021 0935  VLDL 27.0 07/09/2021 0935   LDLCALC 66 07/09/2021 0935   LDLCALC 55 03/09/2021 0914   LDLDIRECT 68.0 01/14/2020 0904    Hepatic Function Latest Ref Rng & Units 08/18/2021 07/09/2021  07/28/2020  Total Protein 6.5 - 8.1 g/dL 7.1 6.8 6.3  Albumin 3.5 - 5.0 g/dL 4.1 4.1 -  AST 15 - 41 U/L _0 ALT 0 - 44 U/L _1 Alk Phosphatase 38 - 126 U/L 110 82 -  Total Bilirubin 0.3 - 1.2 mg/dL 1.2 1.1 0.7  Bilirubin, Direct 0.0 - 0.3 mg/dL - - -    Lab Results  Component Value Date/Time   TSH 1.35 07/09/2021 09:35 AM   TSH 1.20 07/28/2020 09:01 AM    CBC Latest Ref Rng & Units 08/18/2021 07/09/2021 07/28/2020  WBC 4.0 - 10.5 K/uL 7.0 5.1 4.4  Hemoglobin 12.0 - 15.0 g/dL 14.5 14.2 14.5  Hematocrit 36.0 - 46.0 % 40.3 41.1 43.8  Platelets 150 - 400 K/uL 276 277.0 297   No results found for: VD25OH  Clinical ASCVD:  The 10-year ASCVD risk score (Arnett DK, et al., 2019) is: 37.4%   Values used to calculate the score:     Age: 75 years     Sex: Female     Is Non-Hispanic African American: No     Diabetic: No     Tobacco smoker: No     Systolic Blood Pressure: 262 mmHg     Is BP treated: Yes     HDL Cholesterol: 48.2 mg/dL     Total Cholesterol: 141 mg/dL    Social History   Tobacco Use  Smoking Status Former   Packs/day: 0.25   Years: 10.00   Pack years: 2.50   Types: Cigarettes   Quit date: 09/12/1996   Years since quitting: 24.9  Smokeless Tobacco Never   BP Readings from Last 3 Encounters:  08/18/21 (!) 162/63  07/09/21 140/82  05/11/21 108/60   Pulse Readings from Last 3 Encounters:  08/18/21 71  07/09/21 60  05/11/21 60   Wt Readings from Last 3 Encounters:  08/18/21 152 lb 14.4 oz (69.4 kg)  07/09/21 150 lb (68 kg)  05/11/21 152 lb 9.6 oz (69.2 kg)    Assessment: Review of patient past medical history, allergies, medications, health status, including review of consultants reports, laboratory and other test data, was performed as part of comprehensive evaluation and provision of chronic care management services.   SDOH:  (Social Determinants of Health) assessments and interventions performed: Yes   CCM Care Plan  Allergies   Allergen Reactions   Morphine And Related Nausea And Vomiting and Rash    Medications Reviewed Today     Reviewed by Edythe Clarity, [facility] (Pharmacist) on 08/24/21 at [phone]  Med List Status: <None>   Medication Order Taking? Sig Documenting Provider Last Dose Status Informant  celecoxib (CELEBREX) 100 MG capsule [id number] Yes TAKE 1 CAPSULE (100 MG TOTAL) BY MOUTH DAILY. Leamon Arnt, MD Taking Active   diclofenac Sodium (VOLTAREN) 1 % GEL [id number]  Apply 2 g topically 4 (four) times daily as needed. Leamon Arnt, MD  Active   diltiazem [facility]) 180 MG 24 hr capsule [id number] Yes Take 1 capsule (180 mg total) by mouth daily. Sueanne Margarita, MD Taking Active   folic acid (FOLVITE) 704 MCG tablet [id number] Yes Take 400 mcg by mouth daily. [provider] Taking Active   furosemide (LASIX) 40 MG tablet [id number] Yes Take 1  tablet (40 mg total) daily. Take an extra 1/2 tablet (20 mg total) as needed. Sueanne Margarita, MD Taking Active   lisinopril (ZESTRIL) 10 MG tablet [id number] Yes Take 1 tablet (10 mg total) by mouth daily. Sueanne Margarita, MD Taking Active   omeprazole (PRILOSEC) 20 MG capsule [id number] Yes TAKE 1 CAPSULE (20 MG TOTAL) BY MOUTH DAILY. Leamon Arnt, MD Taking Active   predniSONE (DELTASONE) 10 MG tablet [id number] Yes Take 4 tabs qd x 2 days, 3 qd x 2 days, 2 qd x 2d, 1qd x 3 days Leamon Arnt, MD Taking Active   Probiotic Product (ADVANCED PROBIOTIC PO) [id number] Yes Take 1 capsule by mouth daily. [provider] Taking Active   rosuvastatin (CRESTOR) 40 MG tablet [id number] Yes Take 1 tablet (40 mg total) by mouth at bedtime. Sueanne Margarita, MD Taking Active   Specialty Vitamins Products (ONE-A-DAY BONE STRENGTH PO) [id number] Yes Take 1 tablet by mouth daily.  [provider] Taking Active   triamcinolone cream (KENALOG) 0.1 % [id number] Yes Apply 1 application topically daily as needed. Warren Danes, [place] Taking Active              Patient Active Problem List   Diagnosis Date Noted   Family history of breast cancer 08/19/2021   Family history of pancreatic cancer 08/19/2021   Malignant neoplasm of overlapping sites of left breast in female, estrogen receptor positive ([place]) 08/16/2021   Hepatic steatosis 07/28/2020   OSA (obstructive sleep apnea) 05/26/2020   GERD (gastroesophageal reflux disease) 10/02/2018   Essential hypertension 10/02/2018   Mixed hyperlipidemia 10/02/2018   Osteopenia 10/02/2018   Primary osteoarthritis involving multiple joints 10/02/2018   Pulmonary hypertension, primary ([place]) 10/02/2018   History of melanoma 10/02/2018   Polyp of colon, adenomatous 10/02/2018   Gastric polyps 10/02/2018    Immunization History  Administered Date(s) Administered   Fluad Quad(high Dose 65+) 07/16/2019, 07/09/2021   Influenza, High Dose Seasonal PF 06/04/2020   PFIZER(Purple Top)SARS-COV-2 Vaccination 09/27/2019, 10/18/2019, 07/03/2020   Pneumococcal Conjugate-13 07/18/2013   Pneumococcal Polysaccharide-23 09/11/2009   Zoster, Live 08/10/2012   Conditions to be addressed/monitored: Osteopenia, HLD, GERD, HTN, OSA, OA , Breast Cancer  Care Plan : ccm pharmacy care plan  Updates made by Edythe Clarity, RPH since 08/24/2021 12:00 AM     Problem: Osteopenia, HLD, GERD, HTN, OSA, OA   Priority: High  Onset Date: 03/01/2021     Long-Range Goal: Disease Management   Start Date: 03/01/2021  Expected End Date: 03/01/2022  Recent Progress: On track  Priority: High  Note:    Pharmacist Clinical Goal(s):  Patient will contact provider office for questions/concerns as evidenced notation of same in electronic health record through collaboration with PharmD and provider.   Interventions: 1:1 collaboration with Leamon Arnt, MD regarding development and update of comprehensive plan of care as evidenced by provider attestation and co-signature Inter-disciplinary care team collaboration (see  longitudinal plan of care) Comprehensive medication review performed; medication list updated in electronic medical record  Breast Cancer  -Current treatment  None -Medications previously tried: None noted  -Patient mentions she is having some trouble sleeping at night due to anxiety about upcoming procedures and appointments.  She was previously prescribed Xanax before her MRI and states that it helped her calm down.   Oncology MD has discussed possible Effexor to treat these symptoms.   Patient does not mention any depression, she is "at peace" with treatment plan just has some  anxiety about specific procedures.  Would be concerned with initial increase of anxiety during first few weeks of Effexor. -Will collaborate with PCP - recommend prn Xanas vs. Effexor for sleepless nights as well as pre-procedure anxiety.  Hypertension (BP goal <130/80) -Controlled -Current treatment: Lisinopril 10 mg once daily Diltiazem CD 180 mg once daily  -Current home readings: 110s-130s/diastolic 21F-75O -Current dietary habits: minimal processed foods. White meat.  -Current exercise habits: will try to walk a couple times per week no formal exercise -Denies hypotensive/hypertensive symptoms -Educated on BP goals and benefits of medications for prevention of heart attack, stroke and kidney damage; Exercise goal of 150 minutes per week; -Counseled to monitor BP at home 1-2x every week, document, and provide log at future appointments -Recommended to continue current medication  Update 08/24/21 128/80 127/70 - recent blood pressure readings in late November She has not checked since her elevated reading on 12/7.  Have asked her to check once or twice weekly and write them down.  Will have CMA return call in 1-2 weeks to assess BP and possible effect of prn Xanax. Continue medications for now.  Hyperlipidemia: (LDL goal < 70) -Controlled -Current treatment: Rosuvastatin 40 mg once daily -Medications  previously tried: atorvastatin  -Educated on Cholesterol goals;  Exercise goal of 150 minutes per week; -Recommended to continue current medication -Requesting lipid panel due to stopping medication   Update 08/24/21 Lipids have remained controlled off of the fenofibrate She has changed her diet and it shows in most recent lipid panel. Continue current meds - adherent with Crestor ASCVD risk is high at 37.4% - statin dose is appropriate for this level of risk.  Osteopenia (Goal minimize symptoms ) -Controlled -Last DEXA Scan: 11/2019 - osteopenia   -Patient is not a candidate for pharmacologic treatment -Current treatment  Vitamin d3 2000 units once daily Bone builder supplement  -Recommend weight-bearing and muscle strengthening exercises for building and maintaining bone density. -Recommended to continue current medication  GERD (Goal: minimize symptoms ) -Controlled -Reports history of stricture  -Ongoing use of celebrex 100 mg once daily -Current treatment  Omeprazole 20 mg once daily -Recommended to continue current medication  Patient Goals/Self-Care Activities Patient will:  - take medications as prescribed target a minimum of 150 minutes of moderate intensity exercise weekly  Follow Up Plan: RPH f/u 6 months  Medication Assistance: None required.  Patient affirms current coverage meets needs.  Patient's preferred pharmacy is:  [facility] ([facility] - [place], [address] [place] [address] Phone: [phone] Fax: [phone] PHARMACY [id number], [address] [place] [place] [place] [address] Phone: [phone] Fax: [phone]  Follow Up:  Patient agrees to Care Plan and Follow-up.  Plan: [place] f/u 6 months  CPA BP call 3 months        Current Barriers:  New diagnosis of breast cancer  Future Appointments  Date Time  Provider [place]  08/25/2021  9:50 AM GI-315 MR 1 GI-315MRI GI-315 [address]. [facility]  09/07/2021  2:00 PM GI-BCG MM IR 1 GI-BCGMM GI-BREAST CE  09/07/2021  2:30 PM GI-BCG MM IR 1 GI-BCGMM GI-BREAST CE  09/09/2021 11:05 AM BCG BREAST SPECIMEN GI-BCGMM GI-BREAST CE  09/09/2021 11:10 AM BCG BREAST SPECIMEN GI-BCGMM GI-BREAST CE  09/27/2021 10:15 AM [facility] HEALTH COACH [facility] PEC  03/16/2022  9:45 AM [place], Ronalee Red, PA-C [facility] [place],  PharmD Clinical Pharmacist  [facility] [phone]

## 2021-08-24 ENCOUNTER — Encounter: Payer: Self-pay | Admitting: *Deleted

## 2021-08-24 ENCOUNTER — Ambulatory Visit: Payer: Medicare Other | Admitting: Pharmacist

## 2021-08-24 ENCOUNTER — Telehealth: Payer: Self-pay | Admitting: *Deleted

## 2021-08-24 DIAGNOSIS — I10 Essential (primary) hypertension: Secondary | ICD-10-CM

## 2021-08-24 DIAGNOSIS — C50812 Malignant neoplasm of overlapping sites of left female breast: Secondary | ICD-10-CM

## 2021-08-24 DIAGNOSIS — Z170 Estrogen receptor positive status [ER+]: Secondary | ICD-10-CM

## 2021-08-24 DIAGNOSIS — E782 Mixed hyperlipidemia: Secondary | ICD-10-CM

## 2021-08-24 MED ORDER — ALPRAZOLAM 0.5 MG PO TABS: 0.5000 mg | ORAL_TABLET | Freq: Every day | ORAL | 0 refills | Status: DC | PRN

## 2021-08-24 NOTE — Addendum Note (Signed)
Addended by: Billey Chang on: 08/24/2021 12:40 PM   Modules accepted: Orders

## 2021-08-24 NOTE — Patient Instructions (Addendum)
Visit Information   Goals Addressed             This Visit's Progress    Track and Manage My Blood Pressure-Hypertension       Timeframe:  Long-Range Goal Priority:  High Start Date:  08/24/21                           Expected End Date:  02/22/22                     Follow Up Date 11/22/21    - check blood pressure weekly - choose a place to take my blood pressure (home, clinic or office, retail store) - write blood pressure results in a log or diary    Why is this important?   You won't feel high blood pressure, but it can still hurt your blood vessels.  High blood pressure can cause heart or kidney problems. It can also cause a stroke.  Making lifestyle changes like losing a little weight or eating less salt will help.  Checking your blood pressure at home and at different times of the day can help to control blood pressure.  If the doctor prescribes medicine remember to take it the way the doctor ordered.  Call the office if you cannot afford the medicine or if there are questions about it.     Notes:        Patient Care Plan: ccm pharmacy care plan     Problem Identified: Osteopenia, HLD, GERD, HTN, OSA, OA   Priority: High  Onset Date: 03/01/2021     Long-Range Goal: Disease Management   Start Date: 03/01/2021  Expected End Date: 03/01/2022  Recent Progress: On track  Priority: High  Note:    Pharmacist Clinical Goal(s):  Patient will contact provider office for questions/concerns as evidenced notation of same in electronic health record through collaboration with PharmD and provider.   Interventions: 1:1 collaboration with Leamon Arnt, MD regarding development and update of comprehensive plan of care as evidenced by provider attestation and co-signature Inter-disciplinary care team collaboration (see longitudinal plan of care) Comprehensive medication review performed; medication list updated in electronic medical record  Breast Cancer  -Current  treatment  None -Medications previously tried: None noted  -Patient mentions she is having some trouble sleeping at night due to anxiety about upcoming procedures and appointments.  She was previously prescribed Xanax before her MRI and states that it helped her calm down.   Oncology MD has discussed possible Effexor to treat these symptoms.   Patient does not mention any depression, she is "at peace" with treatment plan just has some anxiety about specific procedures.  Would be concerned with initial increase of anxiety during first few weeks of Effexor. -Will collaborate with PCP - recommend prn Xanas vs. Effexor for sleepless nights as well as pre-procedure anxiety.  Hypertension (BP goal <130/80) -Controlled -Current treatment: Lisinopril 10 mg once daily Diltiazem CD 180 mg once daily  -Current home readings: 110s-130s/diastolic 81K-48J -Current dietary habits: minimal processed foods. White meat.  -Current exercise habits: will try to walk a couple times per week no formal exercise -Denies hypotensive/hypertensive symptoms -Educated on BP goals and benefits of medications for prevention of heart attack, stroke and kidney damage; Exercise goal of 150 minutes per week; -Counseled to monitor BP at home 1-2x every week, document, and provide log at future appointments -Recommended to continue current medication  Update 08/24/21  128/80 127/70 - recent blood pressure readings in late November She has not checked since her elevated reading on 12/7.  Have asked her to check once or twice weekly and write them down.  Will have CMA return call in 1-2 weeks to assess BP and possible effect of prn Xanax. Continue medications for now.  Hyperlipidemia: (LDL goal < 70) -Controlled -Current treatment: Rosuvastatin 40 mg once daily -Medications previously tried: atorvastatin  -Educated on Cholesterol goals;  Exercise goal of 150 minutes per week; -Recommended to continue current  medication -Requesting lipid panel due to stopping medication   Update 08/24/21 Lipids have remained controlled off of the fenofibrate She has changed her diet and it shows in most recent lipid panel. Continue current meds - adherent with Crestor ASCVD risk is high at 37.4% - statin dose is appropriate for this level of risk.  Osteopenia (Goal minimize symptoms ) -Controlled -Last DEXA Scan: 11/2019 - osteopenia   -Patient is not a candidate for pharmacologic treatment -Current treatment  Vitamin d3 2000 units once daily Bone builder supplement  -Recommend weight-bearing and muscle strengthening exercises for building and maintaining bone density. -Recommended to continue current medication  GERD (Goal: minimize symptoms ) -Controlled -Reports history of stricture  -Ongoing use of celebrex 100 mg once daily -Current treatment  Omeprazole 20 mg once daily -Recommended to continue current medication  Patient Goals/Self-Care Activities Patient will:  - take medications as prescribed target a minimum of 150 minutes of moderate intensity exercise weekly  Follow Up Plan: [facility]PH f/u 6 months  Medication Assistance: None required.  Patient affirms current coverage meets needs.  Patient's preferred pharmacy is:  [facility] ([facility]) - [place], [address] [place] [address] Phone: [phone] Fax: [phone] PHARMACY [id number], [place] [address] [place] [address] [place] [address] Phone: [phone] Fax: [phone]  Follow Up:  Patient agrees to Care Plan and Follow-up.  Plan: RPH f/u 6 months  CPA BP call 3 months         Patient verbalizes understanding of instructions provided today and agrees to view in [place].  Telephone follow up appointment with pharmacy team member scheduled for: 6 months  Edythe Clarity, [place],  PharmD Clinical Pharmacist  [facility] [phone]

## 2021-08-24 NOTE — Telephone Encounter (Signed)
Spoke to pt concerning [place] from 12.7.22. Denies questions or concerns regarding dx or treatment care plan. Encourage pt to call with needs. Received verbal understanding

## 2021-08-25 ENCOUNTER — Ambulatory Visit
Admission: RE | Admit: 2021-08-25 | Discharge: 2021-08-25 | Disposition: A | Discharge status: Home / Self Care | Payer: Medicare Other | Source: Ambulatory Visit | Attending: Surgery | Admitting: Surgery

## 2021-08-25 ENCOUNTER — Other Ambulatory Visit: Payer: Self-pay

## 2021-08-25 DIAGNOSIS — C50412 Malignant neoplasm of upper-outer quadrant of left female breast: Secondary | ICD-10-CM | POA: Diagnosis not present

## 2021-08-25 DIAGNOSIS — Z170 Estrogen receptor positive status [ER+]: Secondary | ICD-10-CM

## 2021-08-25 MED ORDER — GADOBUTROL 1 MMOL/ML IV SOLN
7.0000 mL | Freq: Once | INTRAVENOUS | Status: AC | PRN
  Administered 2021-08-25: 11:00:00 7 mL via INTRAVENOUS

## 2021-08-26 ENCOUNTER — Encounter: Payer: Self-pay | Admitting: *Deleted

## 2021-08-26 ENCOUNTER — Ambulatory Visit
Admission: RE | Admit: 2021-08-26 | Discharge: 2021-08-26 | Disposition: A | Discharge status: Home / Self Care | Payer: Medicare Other | Source: Ambulatory Visit | Attending: Surgery | Admitting: Surgery

## 2021-08-26 DIAGNOSIS — C50912 Malignant neoplasm of unspecified site of left female breast: Secondary | ICD-10-CM

## 2021-08-30 ENCOUNTER — Telehealth: Payer: Self-pay | Admitting: Genetic Counselor

## 2021-08-30 ENCOUNTER — Encounter: Payer: Self-pay | Admitting: Genetic Counselor

## 2021-08-30 DIAGNOSIS — Z1379 Encounter for other screening for genetic and chromosomal anomalies: Secondary | ICD-10-CM | POA: Insufficient documentation

## 2021-08-30 NOTE — Telephone Encounter (Signed)
Revealed negative genetic testing.  Discussed that we do not know why she has breast cancer. It could be familial, due to a different gene that we are not testing, or maybe our current technology may not be able to pick something up.  It will be important for her to keep in contact with genetics to keep up with whether additional testing may be needed.  Results of pan-cancer panel pending.

## 2021-08-31 ENCOUNTER — Encounter (HOSPITAL_BASED_OUTPATIENT_CLINIC_OR_DEPARTMENT_OTHER): Payer: Self-pay | Admitting: Surgery

## 2021-09-01 MED ORDER — CHLORHEXIDINE GLUCONATE CLOTH 2 % EX PADS: 6.0000 | MEDICATED_PAD | Freq: Once | CUTANEOUS | Status: DC

## 2021-09-01 NOTE — Progress Notes (Signed)
° ° ° ° °  Enhanced Recovery after Surgery for Orthopedics Enhanced Recovery after Surgery is a protocol used to improve the stress on your body and your recovery after surgery.  Patient Instructions  The night before surgery:  No food after midnight. ONLY clear liquids after midnight  The day of surgery (if you do NOT have diabetes):  Drink ONE (1) Pre-Surgery Clear Ensure as directed.   This drink was given to you during your hospital  pre-op appointment visit. The pre-op nurse will instruct you on the time to drink the  Pre-Surgery Ensure depending on your surgery time. Finish the drink at the designated time by the pre-op nurse.  Nothing else to drink after completing the  Pre-Surgery Clear Ensure.  The day of surgery (if you have diabetes): Drink ONE (1) Gatorade 2 (G2) as directed. This drink was given to you during your hospital  pre-op appointment visit.  The pre-op nurse will instruct you on the time to drink the   Gatorade 2 (G2) depending on your surgery time. Color of the Gatorade may vary. Red is not allowed. Nothing else to drink after completing the  Gatorade 2 (G2).         If you have questions, please contact your surgeons office.  Patient given soap with instructions, verbalized understanding.

## 2021-09-02 ENCOUNTER — Ambulatory Visit: Payer: Self-pay | Admitting: Genetic Counselor

## 2021-09-02 DIAGNOSIS — C50812 Malignant neoplasm of overlapping sites of left female breast: Secondary | ICD-10-CM

## 2021-09-02 DIAGNOSIS — Z803 Family history of malignant neoplasm of breast: Secondary | ICD-10-CM

## 2021-09-02 DIAGNOSIS — Z85820 Personal history of malignant melanoma of skin: Secondary | ICD-10-CM

## 2021-09-02 DIAGNOSIS — Z1379 Encounter for other screening for genetic and chromosomal anomalies: Secondary | ICD-10-CM

## 2021-09-02 DIAGNOSIS — Z800 Family history of malignant neoplasm of digestive organs: Secondary | ICD-10-CM

## 2021-09-02 NOTE — Progress Notes (Signed)
HPI:   Ms. Eisen was previously seen in the [place] clinic due to a personal and family history of cancer and concerns regarding a hereditary predisposition to cancer. Please refer to our prior cancer genetics clinic note for more information regarding our discussion, assessment and recommendations, at the time. Ms. Affinito recent genetic test results were disclosed to her, as were recommendations warranted by these results. These results and recommendations are discussed in more detail below.  CANCER HISTORY:  Oncology History Overview Note   Cancer Staging  Malignant neoplasm of overlapping sites of left breast in female, estrogen receptor positive ([place]) Staging form: Breast, AJCC 8th Edition - Clinical stage from 08/11/2021: cT1c, cN0, cM0, G2 - Signed by Truitt Merle, MD on 08/17/2021    Malignant neoplasm of overlapping sites of left breast in female, estrogen receptor positive ([place])  07/27/2021 Mammogram   EXAM: DIGITAL DIAGNOSTIC UNILATERAL LEFT MAMMOGRAM WITH TOMOSYNTHESIS AND CAD; ULTRASOUND LEFT BREAST LIMITED  IMPRESSION: 1. There is a suspicious mass in the left breast at 9 o'clock measuring 1.8 cm.   2. There is a 1.0 cm group of suspicious linear calcifications anterior to the suspicious mass in the left breast, as well as a faint 2 mm group which lie 1 cm lateral to the linear calcifications.   3.  No evidence of left axillary lymphadenopathy   08/11/2021 Cancer Staging   Staging form: Breast, AJCC 8th Edition - Clinical stage from 08/11/2021: Stage IA (cT1c, cN0, cM0, G2, ER+, PR+, HER2-) - Signed by Truitt Merle, MD on 08/18/2021 Stage prefix: Initial diagnosis Histologic grading system: 3 grade system    08/11/2021 Initial Biopsy   Diagnosis 1. Breast, left, needle core biopsy, 9 o'clock, ribbon clip - INVASIVE MAMMARY CARCINOMA - SEE COMMENT 2. Breast, left, needle core biopsy, lower inner quadrant, x clip - INVASIVE MAMMARY CARCINOMA -  MAMMARY CARCINOMA IN-SITU - CALCIFICATIONS - SEE COMMENT Microscopic Comment 1. The biopsy material shows an infiltrative proliferation of cells with arranged linearly and in small clusters. Based on the biopsy, the carcinoma appears Nottingham grade 2 of 3 and measures 1.2 cm in greatest linear extent. 2. Based on the biopsy, the carcinoma appears Nottingham grade 2 of 3 and measures 0.2 cm in greatest linear extent.  1. PROGNOSTIC INDICATORS Results: The tumor cells are NEGATIVE for Her2 (1+). Estrogen Receptor: 100%, POSITIVE, STRONG STAINING INTENSITY Progesterone Receptor: 60%, POSITIVE, STRONG STAINING INTENSITY Proliferation Marker Ki67: 10%  2. PROGNOSTIC INDICATORS Results: The tumor cells are NEGATIVE for Her2 (1+). Estrogen Receptor: 100%, POSITIVE, STRONG STAINING INTENSITY Progesterone Receptor: 60%, POSITIVE, STRONG STAINING INTENSITY Proliferation Marker Ki67: 15%   08/13/2021 Mammogram   EXAM: DIGITAL DIAGNOSTIC UNILATERAL RIGHT MAMMOGRAM WITH TOMOSYNTHESIS AND CAD  IMPRESSION: No mammographic evidence for malignancy.   08/16/2021 Initial Diagnosis   Malignant neoplasm of overlapping sites of left breast in female, estrogen receptor positive ([place])   08/27/2021 Genetic Testing   egative hereditary cancer genetic testing: no pathogenic variants detected in [facility] BRCAPlus Panel and CancerNext-Expanded +RNAinsight Panel.  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  The report dates are 08/27/2021 and 08/30/2021.   The BRCAplus panel offered by [employer] and includes sequencing and deletion/duplication analysis for the following 8 genes: ATM, BRCA1, BRCA2, CDH1, CHEK2, PALB2, PTEN, and TP53.  The CancerNext-Expanded gene panel offered by [facility] and includes sequencing, rearrangement, and RNA analysis for the following 77 genes: AIP, ALK, APC, ATM, AXIN2, BAP1, BARD1, BLM, BMPR1A, BRCA1, BRCA2,  BRIP1, CDC73, CDH1, CDK4, CDKN1B,  CDKN2A, CHEK2, CTNNA1, DICER1, FANCC, FH, FLCN, GALNT12, KIF1B, LZTR1, MAX, MEN1, MET, MLH1, MSH2, MSH3, MSH6, MUTYH, NBN, NF1, NF2, NTHL1, PALB2, PHOX2B, PMS2, POT1, PRKAR1A, PTCH1, PTEN, RAD51C, RAD51D, RB1, RECQL, RET, SDHA, SDHAF2, SDHB, SDHC, SDHD, SMAD4, SMARCA4, SMARCB1, SMARCE1, STK11, SUFU, TMEM127, TP53, TSC1, TSC2, VHL and XRCC2 (sequencing and deletion/duplication); EGFR, EGLN1, HOXB13, KIT, MITF, PDGFRA, POLD1, and POLE (sequencing only); EPCAM and GREM1 (deletion/duplication only).      FAMILY HISTORY:  We obtained a detailed, 4-generation family history.  Significant diagnoses are listed below:      Family History  Problem Relation Age of Onset   Breast cancer Sister 59   Pancreatic cancer Maternal Aunt          dx early 23s   Prostate cancer Cousin          paternal cousins x2; dx after 15      It was reported that Ms. Solivan's niece had negative BRCA testing.  No report was available for review today. Ms. Brizuela is unaware of other previous family history of genetic testing for hereditary cancer risks. There is no reported Ashkenazi Jewish ancestry. There is no known consanguinity.    GENETIC TEST RESULTS:  The [facility] CancerNext-Expanded +RNAinsight Panel found no pathogenic mutations. The CancerNext-Expanded gene panel offered by [facility] and includes sequencing, rearrangement, and RNA analysis for the following 77 genes: AIP, ALK, APC, ATM, AXIN2, BAP1, BARD1, BLM, BMPR1A, BRCA1, BRCA2, BRIP1, CDC73, CDH1, CDK4, CDKN1B, CDKN2A, CHEK2, CTNNA1, DICER1, FANCC, FH, FLCN, GALNT12, KIF1B, LZTR1, MAX, MEN1, MET, MLH1, MSH2, MSH3, MSH6, MUTYH, NBN, NF1, NF2, NTHL1, PALB2, PHOX2B, PMS2, POT1, PRKAR1A, PTCH1, PTEN, RAD51C, RAD51D, RB1, RECQL, RET, SDHA, SDHAF2, SDHB, SDHC, SDHD, SMAD4, SMARCA4, SMARCB1, SMARCE1, STK11, SUFU, TMEM127, TP53, TSC1, TSC2, VHL and XRCC2 (sequencing and deletion/duplication); EGFR, EGLN1, HOXB13, KIT, MITF, PDGFRA, POLD1, and POLE (sequencing only); EPCAM  and GREM1 (deletion/duplication only).  . The test report has been scanned into EPIC and is located under the Molecular Pathology section of the Results Review tab.  A portion of the result report is included below for reference. Genetic testing reported out on August 30, 2021.    Genetic testing identified a variant of uncertain significance (VUS) in the MSH3 gene called  p.N118I (c.353A>T).  At this time, it is unknown if this variant is associated with an increased risk for cancer or if it is benign, but most uncertain variants are reclassified to benign. It should not be used to make medical management decisions. With time, we suspect the laboratory will determine the significance of this variant, if any. If the laboratory reclassifies this variant, we will attempt to contact Ms. Miotke to discuss it further.   Even though a pathogenic variant was not identified, possible explanations for the cancer in the family may include: There may be no hereditary risk for cancer in the family. The cancers in Ms. Debold and/or her family may be sporadic/familial or due to other genetic and environmental factors. There may be a gene mutation in one of these genes that current testing methods cannot detect but that chance is small. There could be another gene that has not yet been discovered, or that we have not yet tested, that is responsible for the cancer diagnoses in the family.  It is also possible there is a hereditary cause for the cancer in the family that Ms. Camilli did not inherit.   Therefore, it is important to remain in touch with  cancer genetics in the future so that we can continue to offer Ms. Fatima the most up to date genetic testing.   ADDITIONAL GENETIC TESTING:  We discussed with Ms. Creegan that her genetic testing was fairly extensive.  If there are additional relevant genes identified to increase cancer risk that can be analyzed in the future, we would be happy to discuss and  coordinate this testing at that time.    CANCER SCREENING RECOMMENDATIONS:  Ms. Renbarger test result is considered negative (normal).  This means that we have not identified a hereditary cause for her personal history of breast cancer or melanoma at this time.   An individual's cancer risk and medical management are not determined by genetic test results alone. Overall cancer risk assessment incorporates additional factors, including personal medical history, family history, and any available genetic information that may result in a personalized plan for cancer prevention and surveillance. Therefore, it is recommended she continue to follow the cancer management and screening guidelines provided by her oncology and primary healthcare provider.  RECOMMENDATIONS FOR FAMILY MEMBERS:   Since she did not inherit a identifiable mutation in a cancer predisposition gene included on this panel, her children could not have inherited a known mutation from her in one of these genes. Individuals in this family might be at some increased risk of developing cancer, over the general population risk, due to the family history of cancer.  Individuals in the family should notify their providers of the family history of cancer. We recommend women in this family have a yearly mammogram beginning at age 50, or 44 years younger than the earliest onset of cancer, an annual clinical breast exam, and perform monthly breast self-exams.  Family members should consider regular skin checks with a dermatologist given the family  history of skin cancer. Other members of the family may still carry a pathogenic variant in one of these genes that Ms. Hazelip did not inherit. Based on the family history, we recommend her sister, who was diagnosed with breast cancer at age 12, have genetic counseling and testing. Ms. Solivan can let us know if we can be of any assistance in coordinating genetic counseling and/or testing for this family member.    We do not recommend familial testing for the MSH3 variant of uncertain significance (VUS).  FOLLOW-UP:  Lastly, we discussed with Ms. Wussow that cancer genetics is a rapidly advancing field and it is possible that new genetic tests will be appropriate for her and/or her family members in the future. We encouraged her to remain in contact with cancer genetics on an annual basis so we can update her personal and family histories and let her know of advances in cancer genetics that may benefit this family.   Our contact number was provided. Ms. Dancer questions were answered to her satisfaction, and she knows she is welcome to call us at anytime with additional questions or concerns.   Dauna Ziska M. Joette Catching, [place], [facility] Genetic Counselor [email or web address] (P) [phone]

## 2021-09-02 NOTE — Telephone Encounter (Signed)
Revealed negative results of pan-cancer panel and VUS in MSH3.

## 2021-09-06 ENCOUNTER — Telehealth: Payer: Self-pay | Admitting: Pharmacist

## 2021-09-06 NOTE — Chronic Care Management (AMB) (Signed)
° ° °  Chronic Care Management Pharmacy Assistant   Name: Madison Clements  MRN: [id number] DOB: [birth year]   Reason for Encounter: General Adherence Call    Recent office visits:  None  Recent consult visits:  None  Hospital visits:  None in previous 6 months  Medications: Outpatient Encounter Medications as of 09/06/2021  Medication Sig   ALPRAZolam (XANAX) 0.5 MG tablet Take 1 tablet (0.5 mg total) by mouth daily as needed for anxiety or sleep.   celecoxib (CELEBREX) 100 MG capsule TAKE 1 CAPSULE (100 MG TOTAL) BY MOUTH DAILY.   diclofenac Sodium (VOLTAREN) 1 % GEL Apply 2 g topically 4 (four) times daily as needed.   diltiazem (TIAZAC) 180 MG 24 hr capsule Take 1 capsule (180 mg total) by mouth daily.   folic acid (FOLVITE) 330 MCG tablet Take 400 mcg by mouth daily.   furosemide (LASIX) 40 MG tablet Take 1 tablet (40 mg total) daily. Take an extra 1/2 tablet (20 mg total) as needed.   lisinopril (ZESTRIL) 10 MG tablet Take 1 tablet (10 mg total) by mouth daily.   omeprazole (PRILOSEC) 20 MG capsule TAKE 1 CAPSULE (20 MG TOTAL) BY MOUTH DAILY.   predniSONE (DELTASONE) 10 MG tablet Take 4 tabs qd x 2 days, 3 qd x 2 days, 2 qd x 2d, 1qd x 3 days   Probiotic Product (ADVANCED PROBIOTIC PO) Take 1 capsule by mouth daily.   rosuvastatin (CRESTOR) 40 MG tablet Take 1 tablet (40 mg total) by mouth at bedtime.   Specialty Vitamins Products (ONE-A-DAY BONE STRENGTH PO) Take 1 tablet by mouth daily.    triamcinolone cream (KENALOG) 0.1 % Apply 1 application topically daily as needed.   Facility-Administered Encounter Medications as of 09/06/2021  Medication   Chlorhexidine Gluconate Cloth 2 % PADS 6 each   And   Chlorhexidine Gluconate Cloth 2 % PADS 6 each   I called and spoke with the patient. She states her anxiety level is doing okay. She has some Alprazolam on hand that she takes as needed. She states so far she has only taken three tablets. She states she had a procedure  yesterday on 09/07/2021 and is having her surgery tomorrow on 09/09/2021.  We also spoke briefly about her blood pressure. She states she has been doing fairly well at checking her blood pressures daily.   08/27/2021 147/82,  08/28/2021 107/75,  08/29/2021 155/74,  08/30/2021 124/76,  08/31/2021 137/80,  09/01/2021 119/69,  09/07/2021 130/64  She denies any issues or concerns with any of her medications at this time.  Care Gaps: Medicare Annual Wellness: scheduled 09/27/2021 Hemoglobin A1C: 5.5% on 07/27/2018 Colonoscopy: Next due on 08/14/2025 Dexa Scan: Next due on 11/14/2021 Mammogram: Next due on 01/19/2022  Future Appointments  Date Time Provider [place]  09/07/2021  2:00 PM [facility] MM IR 1 GI-BCGMM GI-BREAST CE  09/07/2021  2:30 PM [facility] MM IR 1 GI-[facility] GI-BREAST CE  09/09/2021  9:20 AM [facility] BREAST SPECIMEN [facility]GMM GI-BREAST CE  09/09/2021  9:25 AM [facility] BREAST SPECIMEN GI-[facility] GI-BREAST CE  09/27/2021 10:15 AM [facility] HEALTH COACH [facility] PEC  02/28/2022  3:45 PM [facility] CCM PHARMACIST [facility] PEC  03/16/2022  9:45 AM [place], Ronalee Red, PA-C [facility]    Star Rating Drugs: Lisinopril last filled 03/01/2021 90 DS Rosuvastatin last filled 03/01/2021 90 DS  April D Calhoun, [place] Pharmacist Assistant [phone]

## 2021-09-07 ENCOUNTER — Ambulatory Visit
Admission: RE | Admit: 2021-09-07 | Discharge: 2021-09-07 | Disposition: A | Discharge status: Home / Self Care | Payer: Medicare Other | Source: Ambulatory Visit | Attending: Surgery | Admitting: Surgery

## 2021-09-07 DIAGNOSIS — C50912 Malignant neoplasm of unspecified site of left female breast: Secondary | ICD-10-CM | POA: Diagnosis not present

## 2021-09-09 ENCOUNTER — Encounter (HOSPITAL_BASED_OUTPATIENT_CLINIC_OR_DEPARTMENT_OTHER)
Admission: RE | Disposition: A | Discharge status: Home / Self Care | Payer: Self-pay | Source: Home / Self Care | Attending: Surgery

## 2021-09-09 ENCOUNTER — Encounter (HOSPITAL_BASED_OUTPATIENT_CLINIC_OR_DEPARTMENT_OTHER): Payer: Medicare Other | Admitting: Cardiology

## 2021-09-09 ENCOUNTER — Other Ambulatory Visit: Payer: Self-pay

## 2021-09-09 ENCOUNTER — Ambulatory Visit (HOSPITAL_BASED_OUTPATIENT_CLINIC_OR_DEPARTMENT_OTHER): Payer: Medicare Other | Admitting: Anesthesiology

## 2021-09-09 ENCOUNTER — Encounter (HOSPITAL_BASED_OUTPATIENT_CLINIC_OR_DEPARTMENT_OTHER): Payer: Self-pay | Admitting: Surgery

## 2021-09-09 ENCOUNTER — Ambulatory Visit
Admission: RE | Admit: 2021-09-09 | Discharge: 2021-09-09 | Disposition: A | Discharge status: Home / Self Care | Payer: Medicare Other | Source: Ambulatory Visit | Attending: Surgery | Admitting: Surgery

## 2021-09-09 ENCOUNTER — Ambulatory Visit (HOSPITAL_BASED_OUTPATIENT_CLINIC_OR_DEPARTMENT_OTHER)
Admission: RE | Admit: 2021-09-09 | Discharge: 2021-09-09 | Disposition: A | Discharge status: Home / Self Care | Payer: Medicare Other | Attending: Surgery | Admitting: Surgery

## 2021-09-09 DIAGNOSIS — Z803 Family history of malignant neoplasm of breast: Secondary | ICD-10-CM | POA: Diagnosis not present

## 2021-09-09 DIAGNOSIS — K219 Gastro-esophageal reflux disease without esophagitis: Secondary | ICD-10-CM | POA: Insufficient documentation

## 2021-09-09 DIAGNOSIS — Z170 Estrogen receptor positive status [ER+]: Secondary | ICD-10-CM | POA: Diagnosis not present

## 2021-09-09 DIAGNOSIS — C50212 Malignant neoplasm of upper-inner quadrant of left female breast: Secondary | ICD-10-CM | POA: Diagnosis not present

## 2021-09-09 DIAGNOSIS — R928 Other abnormal and inconclusive findings on diagnostic imaging of breast: Secondary | ICD-10-CM | POA: Diagnosis not present

## 2021-09-09 DIAGNOSIS — G4730 Sleep apnea, unspecified: Secondary | ICD-10-CM | POA: Diagnosis not present

## 2021-09-09 DIAGNOSIS — C50912 Malignant neoplasm of unspecified site of left female breast: Secondary | ICD-10-CM

## 2021-09-09 DIAGNOSIS — I10 Essential (primary) hypertension: Secondary | ICD-10-CM | POA: Insufficient documentation

## 2021-09-09 DIAGNOSIS — M1990 Unspecified osteoarthritis, unspecified site: Secondary | ICD-10-CM | POA: Insufficient documentation

## 2021-09-09 DIAGNOSIS — F419 Anxiety disorder, unspecified: Secondary | ICD-10-CM | POA: Diagnosis not present

## 2021-09-09 DIAGNOSIS — G4733 Obstructive sleep apnea (adult) (pediatric): Secondary | ICD-10-CM | POA: Diagnosis not present

## 2021-09-09 DIAGNOSIS — I2510 Atherosclerotic heart disease of native coronary artery without angina pectoris: Secondary | ICD-10-CM | POA: Diagnosis not present

## 2021-09-09 DIAGNOSIS — Z87891 Personal history of nicotine dependence: Secondary | ICD-10-CM | POA: Insufficient documentation

## 2021-09-09 DIAGNOSIS — C50812 Malignant neoplasm of overlapping sites of left female breast: Secondary | ICD-10-CM | POA: Diagnosis not present

## 2021-09-09 DIAGNOSIS — D0512 Intraductal carcinoma in situ of left breast: Secondary | ICD-10-CM | POA: Diagnosis not present

## 2021-09-09 HISTORY — DX: Other specified postprocedural states: Z98.890

## 2021-09-09 HISTORY — DX: Other specified postprocedural states: R11.2

## 2021-09-09 HISTORY — PX: BREAST LUMPECTOMY WITH RADIOACTIVE SEED LOCALIZATION: SHX6424

## 2021-09-09 SURGERY — BREAST LUMPECTOMY WITH RADIOACTIVE SEED LOCALIZATION
Anesthesia: General | Site: Breast | Laterality: Left

## 2021-09-09 MED ORDER — PROPOFOL 10 MG/ML IV BOLUS
INTRAVENOUS | Status: DC | PRN
  Administered 2021-09-09: 130 mg via INTRAVENOUS

## 2021-09-09 MED ORDER — ACETAMINOPHEN 500 MG PO TABS
1000.0000 mg | ORAL_TABLET | Freq: Once | ORAL | Status: AC
  Administered 2021-09-09: 08:00:00 1000 mg via ORAL

## 2021-09-09 MED ORDER — MIDAZOLAM HCL 2 MG/2ML IJ SOLN
INTRAMUSCULAR | Status: AC
  Filled 2021-09-09: qty 2

## 2021-09-09 MED ORDER — ONDANSETRON HCL 4 MG/2ML IJ SOLN
INTRAMUSCULAR | Status: AC
  Filled 2021-09-09: qty 2

## 2021-09-09 MED ORDER — LIDOCAINE HCL (CARDIAC) PF 100 MG/5ML IV SOSY
PREFILLED_SYRINGE | INTRAVENOUS | Status: DC | PRN
  Administered 2021-09-09: 40 mg via INTRAVENOUS

## 2021-09-09 MED ORDER — ONDANSETRON HCL 4 MG/2ML IJ SOLN
INTRAMUSCULAR | Status: DC | PRN
  Administered 2021-09-09: 4 mg via INTRAVENOUS

## 2021-09-09 MED ORDER — PHENYLEPHRINE 40 MCG/ML (10ML) SYRINGE FOR IV PUSH (FOR BLOOD PRESSURE SUPPORT)
PREFILLED_SYRINGE | INTRAVENOUS | Status: DC | PRN
  Administered 2021-09-09: 200 ug via INTRAVENOUS

## 2021-09-09 MED ORDER — MIDAZOLAM HCL 5 MG/5ML IJ SOLN
INTRAMUSCULAR | Status: DC | PRN
  Administered 2021-09-09: 1 mg via INTRAVENOUS

## 2021-09-09 MED ORDER — DEXAMETHASONE SODIUM PHOSPHATE 10 MG/ML IJ SOLN
INTRAMUSCULAR | Status: AC
  Filled 2021-09-09: qty 1

## 2021-09-09 MED ORDER — FENTANYL CITRATE (PF) 100 MCG/2ML IJ SOLN: 25.0000 ug | INTRAMUSCULAR | Status: DC | PRN

## 2021-09-09 MED ORDER — FENTANYL CITRATE (PF) 100 MCG/2ML IJ SOLN
INTRAMUSCULAR | Status: DC | PRN
  Administered 2021-09-09: 50 ug via INTRAVENOUS

## 2021-09-09 MED ORDER — CEFAZOLIN SODIUM-DEXTROSE 2-4 GM/100ML-% IV SOLN
INTRAVENOUS | Status: AC
  Filled 2021-09-09: qty 100

## 2021-09-09 MED ORDER — DEXAMETHASONE SODIUM PHOSPHATE 4 MG/ML IJ SOLN
INTRAMUSCULAR | Status: DC | PRN
  Administered 2021-09-09: 8 mg via INTRAVENOUS

## 2021-09-09 MED ORDER — BUPIVACAINE-EPINEPHRINE 0.25% -1:200000 IJ SOLN
INTRAMUSCULAR | Status: DC | PRN
  Administered 2021-09-09: 7 mL

## 2021-09-09 MED ORDER — APREPITANT 40 MG PO CAPS
40.0000 mg | ORAL_CAPSULE | Freq: Once | ORAL | Status: AC
  Administered 2021-09-09: 08:00:00 40 mg via ORAL

## 2021-09-09 MED ORDER — CEFAZOLIN SODIUM-DEXTROSE 2-4 GM/100ML-% IV SOLN
2.0000 g | INTRAVENOUS | Status: AC
  Administered 2021-09-09: 09:00:00 2 g via INTRAVENOUS

## 2021-09-09 MED ORDER — ACETAMINOPHEN 500 MG PO TABS
ORAL_TABLET | ORAL | Status: AC
  Filled 2021-09-09: qty 2

## 2021-09-09 MED ORDER — LACTATED RINGERS IV SOLN: INTRAVENOUS | Status: DC

## 2021-09-09 MED ORDER — APREPITANT 40 MG PO CAPS
ORAL_CAPSULE | ORAL | Status: AC
  Filled 2021-09-09: qty 1

## 2021-09-09 MED ORDER — DROPERIDOL 2.5 MG/ML IJ SOLN
INTRAMUSCULAR | Status: DC | PRN
  Administered 2021-09-09: .625 mg via INTRAVENOUS

## 2021-09-09 MED ORDER — ACETAMINOPHEN 500 MG PO TABS: 1000.0000 mg | ORAL_TABLET | ORAL | Status: DC

## 2021-09-09 MED ORDER — FENTANYL CITRATE (PF) 100 MCG/2ML IJ SOLN
INTRAMUSCULAR | Status: AC
  Filled 2021-09-09: qty 2

## 2021-09-09 MED ORDER — LIDOCAINE 2% (20 MG/ML) 5 ML SYRINGE
INTRAMUSCULAR | Status: AC
  Filled 2021-09-09: qty 5

## 2021-09-09 SURGICAL SUPPLY — 46 items
APL PRP STRL LF DISP 70% ISPRP (MISCELLANEOUS) ×1
APL SKNCLS STERI-STRIP NONHPOA (GAUZE/BANDAGES/DRESSINGS) ×1
APPLIER CLIP 9.375 MED OPEN (MISCELLANEOUS) ×2
APR CLP MED 9.3 20 MLT OPN (MISCELLANEOUS) ×1
BENZOIN TINCTURE PRP APPL 2/3 (GAUZE/BANDAGES/DRESSINGS) ×2 IMPLANT
BLADE HEX COATED 2.75 (ELECTRODE) ×2 IMPLANT
BLADE SURG 15 STRL LF DISP TIS (BLADE) ×1 IMPLANT
BLADE SURG 15 STRL SS (BLADE) ×2
CANISTER SUCT 1200ML W/VALVE (MISCELLANEOUS) ×1 IMPLANT
CHLORAPREP W/TINT 26 (MISCELLANEOUS) ×2 IMPLANT
CLIP APPLIE 9.375 MED OPEN (MISCELLANEOUS) ×1 IMPLANT
COVER BACK TABLE 60X90IN (DRAPES) ×2 IMPLANT
COVER MAYO STAND STRL (DRAPES) ×2 IMPLANT
COVER PROBE W GEL 5X96 (DRAPES) ×2 IMPLANT
DRAPE LAPAROTOMY 100X72 PEDS (DRAPES) ×2 IMPLANT
DRAPE UTILITY XL STRL (DRAPES) ×2 IMPLANT
DRSG TEGADERM 4X4.75 (GAUZE/BANDAGES/DRESSINGS) ×2 IMPLANT
ELECT REM PT RETURN 9FT ADLT (ELECTROSURGICAL) ×2
ELECTRODE REM PT RTRN 9FT ADLT (ELECTROSURGICAL) ×1 IMPLANT
GAUZE SPONGE 4X4 12PLY STRL LF (GAUZE/BANDAGES/DRESSINGS) ×1 IMPLANT
GLOVE SURG ENC MOIS LTX SZ7 (GLOVE) ×2 IMPLANT
GLOVE SURG POLYISO LF SZ6.5 (GLOVE) ×1 IMPLANT
GLOVE SURG UNDER POLY LF SZ7 (GLOVE) ×1 IMPLANT
GLOVE SURG UNDER POLY LF SZ7.5 (GLOVE) ×2 IMPLANT
GOWN STRL REUS W/ TWL LRG LVL3 (GOWN DISPOSABLE) ×2 IMPLANT
GOWN STRL REUS W/ TWL XL LVL3 (GOWN DISPOSABLE) IMPLANT
GOWN STRL REUS W/TWL LRG LVL3 (GOWN DISPOSABLE) ×4
GOWN STRL REUS W/TWL XL LVL3 (GOWN DISPOSABLE) ×2
KIT MARKER MARGIN INK (KITS) ×2 IMPLANT
NDL HYPO 25X1 1.5 SAFETY (NEEDLE) ×1 IMPLANT
NEEDLE HYPO 25X1 1.5 SAFETY (NEEDLE) ×2 IMPLANT
NS IRRIG 1000ML POUR BTL (IV SOLUTION) ×2 IMPLANT
PACK BASIN DAY SURGERY FS (CUSTOM PROCEDURE TRAY) ×2 IMPLANT
PENCIL SMOKE EVACUATOR (MISCELLANEOUS) ×2 IMPLANT
SLEEVE SCD COMPRESS KNEE MED (STOCKING) ×2 IMPLANT
SPONGE T-LAP 4X18 ~~LOC~~+RFID (SPONGE) ×2 IMPLANT
STRIP CLOSURE SKIN 1/2X4 (GAUZE/BANDAGES/DRESSINGS) ×2 IMPLANT
SUT MON AB 4-0 PC3 18 (SUTURE) ×2 IMPLANT
SUT VIC AB 3-0 SH 27 (SUTURE) ×4
SUT VIC AB 3-0 SH 27X BRD (SUTURE) ×1 IMPLANT
SYR BULB EAR ULCER 3OZ GRN STR (SYRINGE) ×1 IMPLANT
SYR CONTROL 10ML LL (SYRINGE) ×2 IMPLANT
TOWEL GREEN STERILE FF (TOWEL DISPOSABLE) ×2 IMPLANT
TRAY FAXITRON CT DISP (TRAY / TRAY PROCEDURE) ×2 IMPLANT
TUBE CONNECTING 20X1/4 (TUBING) ×1 IMPLANT
YANKAUER SUCT BULB TIP NO VENT (SUCTIONS) ×1 IMPLANT

## 2021-09-09 NOTE — Interval H&P Note (Signed)
History and Physical Interval Note:  09/09/2021 7:20 AM  Madison Clements  has presented today for surgery, with the diagnosis of LEFT BREAST INVASIVE DUCTAL CARCINOMA.  The various methods of treatment have been discussed with the patient and family. After consideration of risks, benefits and other options for treatment, the patient has consented to  Procedure(s): LEFT BREAST LUMPECTOMY WITH RADIOACTIVE SEED LOCALIZATION X2 (Left) as a surgical intervention.  The patient's history has been reviewed, patient examined, no change in status, stable for surgery.  I have reviewed the patient's chart and labs.  Questions were answered to the patient's satisfaction.     Maia Petties

## 2021-09-09 NOTE — Anesthesia Preprocedure Evaluation (Addendum)
Anesthesia Evaluation  Patient identified by MRN, date of birth, ID band Patient awake    Reviewed: Allergy & Precautions, NPO status , Patient's Chart, lab work & pertinent test results  History of Anesthesia Complications (+) PONV  Airway Mallampati: III  TM Distance: >3 FB Neck ROM: Full  Mouth opening: Limited Mouth Opening  Dental no notable dental hx. (+) Teeth Intact, Dental Advisory Given   Pulmonary sleep apnea , former smoker,    Pulmonary exam normal breath sounds clear to auscultation       Cardiovascular hypertension, Pt. on medications + CAD  Normal cardiovascular exam Rhythm:Regular Rate:Normal     Neuro/Psych PSYCHIATRIC DISORDERS Anxiety negative neurological ROS     GI/Hepatic Neg liver ROS, GERD  Medicated and Controlled,  Endo/Other  negative endocrine ROS  Renal/GU negative Renal ROS  negative genitourinary   Musculoskeletal  (+) Arthritis ,   Abdominal   Peds  Hematology negative hematology ROS (+)   Anesthesia Other Findings   Reproductive/Obstetrics                           Anesthesia Physical Anesthesia Plan  ASA: 3  Anesthesia Plan: General   Post-op Pain Management:    Induction: Intravenous  PONV Risk Score and Plan: 4 or greater and Ondansetron, Dexamethasone, Treatment may vary due to age or medical condition and Aprepitant  Airway Management Planned: LMA  Additional Equipment:   Intra-op Plan:   Post-operative Plan: Extubation in OR  Informed Consent: I have reviewed the patients History and Physical, chart, labs and discussed the procedure including the risks, benefits and alternatives for the proposed anesthesia with the patient or authorized representative who has indicated his/her understanding and acceptance.     Dental advisory given  Plan Discussed with: CRNA  Anesthesia Plan Comments:        Anesthesia Quick Evaluation

## 2021-09-09 NOTE — Discharge Instructions (Addendum)
[place] Office Phone Number [phone]  BREAST BIOPSY/ PARTIAL MASTECTOMY: POST OP INSTRUCTIONS  Always review your discharge instruction sheet given to you by the facility where your surgery was performed.  IF YOU HAVE DISABILITY OR FAMILY LEAVE FORMS, YOU MUST BRING THEM TO THE OFFICE FOR PROCESSING.  DO NOT GIVE THEM TO YOUR DOCTOR.  A prescription for pain medication may be given to you upon discharge.  Take your pain medication as prescribed, if needed.  If narcotic pain medicine is not needed, then you may take acetaminophen (Tylenol) or ibuprofen (Advil) as needed. Take your usually prescribed medications unless otherwise directed If you need a refill on your pain medication, please contact your pharmacy.  They will contact our office to request authorization.  Prescriptions will not be filled after 5pm or on week-ends. You should eat very light the first 24 hours after surgery, such as soup, crackers, pudding, etc.  Resume your normal diet the day after surgery. Most patients will experience some swelling and bruising in the breast.  Ice packs and a good support bra will help.  Swelling and bruising can take several days to resolve.  It is common to experience some constipation if taking pain medication after surgery.  Increasing fluid intake and taking a stool softener will usually help or prevent this problem from occurring.  A mild laxative (Milk of Magnesia or Miralax) should be taken according to package directions if there are no bowel movements after 48 hours. Unless discharge instructions indicate otherwise, you may remove your bandages 24-48 hours after surgery, and you may shower at that time.  You may have steri-strips (small skin tapes) in place directly over the incision.  These strips should be left on the skin for 7-10 days.  If your surgeon used skin glue on the incision, you may shower in 24 hours.  The glue will flake off over the next 2-3 weeks.  Any  sutures or staples will be removed at the office during your follow-up visit. ACTIVITIES:  You may resume regular daily activities (gradually increasing) beginning the next day.  Wearing a good support bra or sports bra minimizes pain and swelling.  You may have sexual intercourse when it is comfortable. You may drive when you no longer are taking prescription pain medication, you can comfortably wear a seatbelt, and you can safely maneuver your car and apply brakes. RETURN TO WORK:  ______________________________________________________________________________________ Madison Clements Bast should see your doctor in the office for a follow-up appointment approximately two weeks after your surgery.  Your doctors nurse will typically make your follow-up appointment when she calls you with your pathology report.  Expect your pathology report 2-3 business days after your surgery.  You may call to check if you do not hear from [place] after three days. OTHER INSTRUCTIONS: _______________________________________________________________________________________________ _____________________________________________________________________________________________________________________________________ _____________________________________________________________________________________________________________________________________ _____________________________________________________________________________________________________________________________________  WHEN TO CALL YOUR DOCTOR: Fever over 101.0 Nausea and/or vomiting. Extreme swelling or bruising. Continued bleeding from incision. Increased pain, redness, or drainage from the incision.  The clinic staff is available to answer your questions during regular business hours.  Please dont hesitate to call and ask to speak to one of the nurses for clinical concerns.  If you have a medical emergency, go to the nearest emergency room or call 911.  A surgeon from [facility] is always on call at the hospital.  For further questions, please visit [email or web address]  Post Anesthesia Home Care Instructions  Activity: Get plenty of rest for the remainder of the day.  A responsible individual must stay with you for 24 hours following the procedure.  For the next 24 hours, DO NOT: -Drive a car -Paediatric nurse -Drink alcoholic beverages -Take any medication unless instructed by your physician -Make any legal decisions or sign important papers.  Meals: Start with liquid foods such as gelatin or soup. Progress to regular foods as tolerated. Avoid greasy, spicy, heavy foods. If nausea and/or vomiting occur, drink only clear liquids until the nausea and/or vomiting subsides. Call your physician if vomiting continues.  Special Instructions/Symptoms: Your throat may feel dry or sore from the anesthesia or the breathing tube placed in your throat during surgery. If this causes discomfort, gargle with warm salt water. The discomfort should disappear within 24 hours.  If you had a scopolamine patch placed behind your ear for the management of post- operative nausea and/or vomiting:  1. The medication in the patch is effective for 72 hours, after which it should be removed.  Wrap patch in a tissue and discard in the trash. Wash hands thoroughly with soap and water. 2. You may remove the patch earlier than 72 hours if you experience unpleasant side effects which may include dry mouth, dizziness or visual disturbances. 3. Avoid touching the patch. Wash your hands with soap and water after contact with the patch.       Next dose of Tylenol may be given at 2P

## 2021-09-09 NOTE — Anesthesia Procedure Notes (Signed)
Procedure Name: LMA Insertion Date/Time: 09/09/2021 9:34 AM Performed by: Tawni Millers, CRNA Pre-anesthesia Checklist: Patient identified, Emergency Drugs available, Suction available and Patient being monitored Patient Re-evaluated:Patient Re-evaluated prior to induction Oxygen Delivery Method: Circle system utilized Preoxygenation: Pre-oxygenation with 100% oxygen Induction Type: IV induction Ventilation: Mask ventilation without difficulty LMA: LMA inserted LMA Size: 3.0 Number of attempts: 1 Airway Equipment and Method: Bite block Placement Confirmation: positive ETCO2 Tube secured with: Tape Dental Injury: Teeth and Oropharynx as per pre-operative assessment

## 2021-09-09 NOTE — Transfer of Care (Signed)
Immediate Anesthesia Transfer of Care Note  Patient: Madison Clements  Procedure(s) Performed: LEFT BREAST LUMPECTOMY WITH RADIOACTIVE SEED LOCALIZATION X2 (Left: Breast)  Patient Location: PACU  Anesthesia Type:General  Level of Consciousness: awake  Airway & Oxygen Therapy: Patient Spontanous Breathing and Patient connected to face mask oxygen  Post-op Assessment: Report given to RN and Post -op Vital signs reviewed and stable  Post vital signs: Reviewed and stable  Last Vitals:  Vitals Value Taken Time  BP    Temp    Pulse 52 09/09/21 1014  Resp 14 09/09/21 1014  SpO2 98 % 09/09/21 1014  Vitals shown include unvalidated device data.  Last Pain:  Vitals:   09/09/21 0742  TempSrc: Oral  PainSc: 0-No pain         Complications: No notable events documented.

## 2021-09-09 NOTE — Anesthesia Postprocedure Evaluation (Signed)
Anesthesia Post Note  Patient: Madison Clements  Procedure(s) Performed: LEFT BREAST LUMPECTOMY WITH RADIOACTIVE SEED LOCALIZATION X2 (Left: Breast)     Patient location during evaluation: PACU Anesthesia Type: General Level of consciousness: awake and alert Pain management: pain level controlled Vital Signs Assessment: post-procedure vital signs reviewed and stable Respiratory status: spontaneous breathing, nonlabored ventilation, respiratory function stable and patient connected to nasal cannula oxygen Cardiovascular status: blood pressure returned to baseline and stable Postop Assessment: no apparent nausea or vomiting Anesthetic complications: no   No notable events documented.  Last Vitals:  Vitals:   09/09/21 1043 09/09/21 1103  BP: (!) 91/45 (!) 103/52  Pulse: (!) 49 60  Resp: 16 18  Temp:  (!) 36.2 C  SpO2: 99% 98%    Last Pain:  Vitals:   09/09/21 1103  TempSrc:   PainSc: 0-No pain                 Hajar Penninger L Farooq Petrovich

## 2021-09-09 NOTE — Op Note (Signed)
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 Anesthesia:  GEN - LMA Indications:  This is a 75 year old female with a family history breast cancer in her sister and a maternal aunt who presents after a recent 69 month follow-up diagnostic mammogram revealed an area of distortion in her left breast.  Further work-up revealed an invasive ductal carcinoma located at 0900 2 cmfn measuring 1.8 x 1.2 x 0.9 cm.  Immediately anterior to this mass was an area of calcifications that was also biopsied.  Both biopsies were positive.  The axilla was negative.  There is a 2 mm area of calcifications immediately lateral to the small anterior biopsied area.  Two seeds were placed yesterday to bracket both areas of cancer.   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    Description of procedure: The patient is brought to the operating room placed in supine position on the operating room table. After an adequate level of general anesthesia was obtained, her left breast was prepped with ChloraPrep and draped in sterile fashion. A timeout was taken to ensure the proper patient and proper procedure. We interrogated the breast with the neoprobe. We made a transverse incision across the lower inner quadrant after infiltrating with 0.25% Marcaine. Dissection was carried down in the breast tissue with cautery. We used the neoprobe to guide [place] towards the radioactive seeds. We excised an area of tissue around the radioactive seed 3 cm in diameter.  The anterior seed was displaced during dissection and was removed separately. The specimen was removed and was oriented with a paint kit. Specimen mammogram showed the posterior radioactive seed as well as both biopsy clips within the specimen. This was sent for pathologic  examination. There is no residual radioactivity within the biopsy cavity. We inspected carefully for hemostasis. Clips were placed in all of the margins for orientation for radiation therapy.  The wound was thoroughly irrigated. The wound was closed with a deep layer of 3-0 Vicryl and a subcuticular layer of 4-0 Monocryl. Benzoin Steri-Strips were applied. The patient was then extubated and brought to the recovery room in stable condition. All sponge, instrument, and needle counts are correct.  Imogene Burn. Georgette Dover, MD, [facility] Surgery  General/ Trauma Surgery  09/09/2021 10:05 AM

## 2021-09-10 ENCOUNTER — Ambulatory Visit: Payer: Self-pay | Admitting: Pharmacist

## 2021-09-10 DIAGNOSIS — I10 Essential (primary) hypertension: Secondary | ICD-10-CM

## 2021-09-10 DIAGNOSIS — E782 Mixed hyperlipidemia: Secondary | ICD-10-CM

## 2021-09-10 NOTE — Progress Notes (Signed)
Chronic Care Management   Outreach Note  09/10/2021 Name: Madison Clements MRN: [id number] DOB: Feb 01, 1946  Referred by: Leamon Arnt, MD Reason for referral : No chief complaint on file.   Updated blood pressure in chart with patient-reported home BP:  BP Readings from Last 3 Encounters:  09/07/21 130/64  09/09/21 (!) 103/52  08/18/21 (!) 162/63     Patient Care Plan: ccm pharmacy care plan     Problem Identified: Osteopenia, HLD, GERD, HTN, OSA, OA   Priority: High  Onset Date: 03/01/2021     Long-Range Goal: Disease Management   Start Date: 03/01/2021  Expected End Date: 03/01/2022  Recent Progress: On track  Priority: High  Note:    Pharmacist Clinical Goal(s):  Patient will contact provider office for questions/concerns as evidenced notation of same in electronic health record through collaboration with PharmD and provider.   Interventions: 1:1 collaboration with Leamon Arnt, MD regarding development and update of comprehensive plan of care as evidenced by provider attestation and co-signature Inter-disciplinary care team collaboration (see longitudinal plan of care) Comprehensive medication review performed; medication list updated in electronic medical record  Breast Cancer  -Current treatment  None -Medications previously tried: None noted  -Patient mentions she is having some trouble sleeping at night due to anxiety about upcoming procedures and appointments.  She was previously prescribed Xanax before her MRI and states that it helped her calm down.   Oncology MD has discussed possible Effexor to treat these symptoms.   Patient does not mention any depression, she is "at peace" with treatment plan just has some anxiety about specific procedures.  Would be concerned with initial increase of anxiety during first few weeks of Effexor. -Will collaborate with PCP - recommend prn Xanas vs. Effexor for sleepless nights as well as pre-procedure  anxiety.  Hypertension (BP goal <130/80) -Controlled -Current treatment: Lisinopril 10 mg once daily Diltiazem CD 180 mg once daily  -Current home readings: 110s-130s/diastolic 93T-70V -Current dietary habits: minimal processed foods. White meat.  -Current exercise habits: will try to walk a couple times per week no formal exercise -Denies hypotensive/hypertensive symptoms -Educated on BP goals and benefits of medications for prevention of heart attack, stroke and kidney damage; Exercise goal of 150 minutes per week; -Counseled to monitor BP at home 1-2x every week, document, and provide log at future appointments -Recommended to continue current medication  Update 08/24/21 128/80 127/70 - recent blood pressure readings in late November She has not checked since her elevated reading on 12/7.  Have asked her to check once or twice weekly and write them down.  Will have CMA return call in 1-2 weeks to assess BP and possible effect of prn Xanax. Continue medications for now.  Hyperlipidemia: (LDL goal < 70) -Controlled -Current treatment: Rosuvastatin 40 mg once daily -Medications previously tried: atorvastatin  -Educated on Cholesterol goals;  Exercise goal of 150 minutes per week; -Recommended to continue current medication -Requesting lipid panel due to stopping medication   Update 08/24/21 Lipids have remained controlled off of the fenofibrate She has changed her diet and it shows in most recent lipid panel. Continue current meds - adherent with Crestor ASCVD risk is high at 37.4% - statin dose is appropriate for this level of risk.  Osteopenia (Goal minimize symptoms ) -Controlled -Last DEXA Scan: 11/2019 - osteopenia   -Patient is not a candidate for pharmacologic treatment -Current treatment  Vitamin d3 2000 units once daily Bone builder supplement  -Recommend weight-bearing and muscle  strengthening exercises for building and maintaining bone density. -Recommended to  continue current medication  GERD (Goal: minimize symptoms ) -Controlled -Reports history of stricture  -Ongoing use of celebrex 100 mg once daily -Current treatment  Omeprazole 20 mg once daily -Recommended to continue current medication  Patient Goals/Self-Care Activities Patient will:  - take medications as prescribed target a minimum of 150 minutes of moderate intensity exercise weekly  Follow Up Plan: [facility] f/u 6 months  Medication Assistance: None required.  Patient affirms current coverage meets needs.  Patient's preferred pharmacy is:  [facility] ([facility] - [place], [place] [address] Phone: [phone] Fax: [phone] PHARMACY [id number], [place] [address] Phone: [phone] Fax: [phone]  Follow Up:  Patient agrees to Care Plan and Follow-up.  Plan: [place] f/u 6 months  CPA BP call 3 months         Beverly Milch, PharmD Clinical Pharmacist  [facility] [phone]

## 2021-09-14 ENCOUNTER — Encounter (HOSPITAL_BASED_OUTPATIENT_CLINIC_OR_DEPARTMENT_OTHER): Payer: Self-pay | Admitting: Surgery

## 2021-09-16 LAB — SURGICAL PATHOLOGY

## 2021-09-17 ENCOUNTER — Telehealth: Payer: Self-pay | Admitting: *Deleted

## 2021-09-17 ENCOUNTER — Encounter: Payer: Self-pay | Admitting: *Deleted

## 2021-09-17 ENCOUNTER — Encounter: Payer: Self-pay | Admitting: Cardiology

## 2021-09-17 NOTE — Telephone Encounter (Signed)
Ordered oncotype per Dr. Feng.  Faxed requisition to pathology.   

## 2021-09-24 ENCOUNTER — Encounter: Payer: Self-pay | Admitting: Family Medicine

## 2021-09-24 DIAGNOSIS — H02831 Dermatochalasis of right upper eyelid: Secondary | ICD-10-CM | POA: Diagnosis not present

## 2021-09-24 DIAGNOSIS — H25813 Combined forms of age-related cataract, bilateral: Secondary | ICD-10-CM | POA: Diagnosis not present

## 2021-09-24 DIAGNOSIS — H43393 Other vitreous opacities, bilateral: Secondary | ICD-10-CM | POA: Diagnosis not present

## 2021-09-24 DIAGNOSIS — H33311 Horseshoe tear of retina without detachment, right eye: Secondary | ICD-10-CM | POA: Diagnosis not present

## 2021-09-24 LAB — HM DIABETES EYE EXAM

## 2021-09-27 ENCOUNTER — Ambulatory Visit: Payer: Medicare Other

## 2021-10-04 ENCOUNTER — Encounter: Payer: Self-pay | Admitting: *Deleted

## 2021-10-04 DIAGNOSIS — Z800 Family history of malignant neoplasm of digestive organs: Secondary | ICD-10-CM

## 2021-10-04 DIAGNOSIS — C50812 Malignant neoplasm of overlapping sites of left female breast: Secondary | ICD-10-CM

## 2021-10-06 ENCOUNTER — Encounter: Payer: Self-pay | Admitting: Hematology

## 2021-10-11 ENCOUNTER — Encounter: Payer: Self-pay | Admitting: *Deleted

## 2021-10-12 NOTE — Progress Notes (Incomplete)
Location of Breast Cancer:  Malignant neoplasm of upper-inner quadrant of left breast in female, estrogen receptor positive   Histology per Pathology Report:  09/09/2021 FINAL MICROSCOPIC DIAGNOSIS:  A. BREAST, LEFT, LUMPECTOMY:  -  Invasive ductal carcinoma, Nottingham grade 2 of 3, 3.5 cm  -  Ductal carcinoma in-situ, intermediate grade  -  Margins uninvolved by carcinoma (<0.1 cm; anterior)  -  Previous biopsy site changes present   Receptor Status: ER(100%), PR (60%), Her2-neu (Negative via IHC), Ki-67(10-15%)  Did patient present with symptoms (if so, please note symptoms) or was this found on screening mammography?: Left diagnostic MM and [place] on 07/27/21 showed 1.8 cm mass at 9 o'clock, and 1 cm calcifications anterior to primary mass.   Past/Anticipated interventions by surgeon, if any: 09/09/2021 --Dr. Donnie Mesa Left bracketed radioactive seed localized lumpectomy  Past/Anticipated interventions by medical oncology, if any:  Under care of Dr. Truitt Merle 08/18/2021 Given the early stage disease, she likely need a lumpectomy. She is agreeable with that.  She was seen by Dr. Georgette Dover today and likely will proceed with surgery soon.  she would like an MRI to evaluate the extent of her disease. This is scheduled for 08/25/21. I discussed the role of a Oncotype Dx test on the surgical sample to determine her risk of recurrence and benefit of chemo. She would like to proceed.  Giving the strong ER and PR expression in her postmenopausal status, I recommend adjuvant endocrine therapy with aromatase inhibitor or Tamoxifen for a total of 5-10 years to reduce the risk of cancer recurrence.  Given her arthritis at baseline, I would recommend tamoxifen PLAN:  -we will call her with E-cadherin results -I prescribed Effexor for her today -breast MRI 08/25/21 -proceed with lumpectomy soon -Oncotype on her surgical sample  -I will see her back after surgery or after radiation   Lymphedema  issues, if any:  ***    Pain issues, if any:  ***   SAFETY ISSUES: Prior radiation? *** Pacemaker/ICD? *** Possible current pregnancy? No--hysterectomy Is the patient on methotrexate? ***  Current Complaints / other details:  ***

## 2021-10-12 NOTE — Progress Notes (Signed)
Radiation Oncology         [phone] ________________________________  Name: Madison Clements MRN: [id number]  Date: 10/13/2021  DOB: 26-Jan-1946  Follow-Up Visit Note  Outpatient  CC: Leamon Arnt, MD  Truitt Merle, MD  Diagnosis:      ICD-10-CM   1. Malignant neoplasm of overlapping sites of left breast in female, estrogen receptor positive ([place])  C50.812    Z17.0       pT2, pN not assigned   S/p lumpectomy:  Left Breast UIQ, Invasive ductal carcinoma, and intermediate grade DCIS, ER+ / PR+ / Her2-, Grade 2  CHIEF COMPLAINT: Here to discuss management of left breast cancer  Narrative:  The patient returns today for follow-up.     Since breast clinic consultation date of 08/18/21, she underwent genetic testing (on 08/18/21) which revealed no known clinically actionable variants detected. 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  The patient also met with Dr. Burr Medico on 08/18/21 to discuss further treatment options. Dr. Burr Medico ultimately recommended recommended adjuvant endocrine therapy with aromatase inhibitor or Tamoxifen for a total of 5-10 years to reduce the risk of cancer recurrence. (Given her arthritis at baseline, tamoxifen was recommended). The patient expressed interest in this plan, and will return to Dr. Burr Medico in the near future/ after XRT to discuss this further. Dr. Burr Medico also ordered Oncotype testing to assess the role of chemotherapy.  Bilateral breast MRI on 08/25/21 demonstrated the biopsy-proven invasive ductal carcinoma measuring approximately 2.8 x 1.7 x 1.5 cm in the inner breast, middle depth.  (Enhancement was appreciated to extend approximately 1.4 cm posterior to the clip). MRI also showed an approximately 1.8 cm post biopsy hematoma, with associated rim enhancement at the site of the biopsy-proven invasive ductal carcinoma and DCIS in the LIQ, anterior depth. No evidence of malignancy involving the right breast  was appreciated, or pathologic lymphadenopathy in either breast.  The patient opted to proceed with left breast lumpectomy on 09/09/21 under the care of Dr. Georgette Dover. Pathology from the procedure revealed: tumor size of 3.5 cm; histology of grade 2 invasive ductal carcinoma, and intermediate grade DCIS; all margins negative for both invasive and in-situ carcinoma, with margin status to invasive disease of <0.1 cm from the anterior margin, and margin status to in situ disease of 0.4 cm from the medial margin; no lymph nodes were examined;  ER status: 100% positive; PR status 60% positive (both with strong staining intensity); Proliferation marker Ki67 at 10-15%; Her2 status negative; Grade 2.  Oncotype DX was obtained on the final surgical sample and the recurrence score of 21 predicts a risk of recurrence outside the breast over the next 9 years of 7%, if the patient's only systemic therapy is an antiestrogen for 5 years.  It also predicts no significant benefit from chemotherapy.  Of note: the patient reports drinking around 10 drinks per/week. Dr. Burr Medico counseled her on this and encouraged her to limit her intake.  Symptomatically, the patient reports: Lymphedema issues, if any:  No    Pain issues, if any:  No   SAFETY ISSUES: Prior radiation? No NO Pacemaker or ICD        ALLERGIES:  is allergic to morphine and morphine and related.  Meds: Current Outpatient Medications  Medication Sig Dispense Refill   celecoxib (CELEBREX) 100 MG capsule TAKE 1 CAPSULE (100 MG TOTAL) BY MOUTH DAILY. 90 capsule 3   diltiazem (TIAZAC) 180 MG 24 hr capsule  Take 1 capsule (180 mg total) by mouth daily. 90 capsule 3   folic acid (FOLVITE) 846 MCG tablet Take 400 mcg by mouth daily.     furosemide (LASIX) 40 MG tablet Take 1 tablet (40 mg total) daily. Take an extra 1/2 tablet (20 mg total) as needed. (Patient taking differently: 20 mg daily. Take 1 tablet (40 mg total) daily. Take an extra 1/2 tablet (20 mg total)  as needed. Patient states that she is taking 20 mg currently) 135 tablet 3   lisinopril (ZESTRIL) 10 MG tablet Take 1 tablet (10 mg total) by mouth daily. 90 tablet 3   omeprazole (PRILOSEC) 20 MG capsule TAKE 1 CAPSULE (20 MG TOTAL) BY MOUTH DAILY. 90 capsule 3   Probiotic Product (ADVANCED PROBIOTIC PO) Take 1 capsule by mouth daily.     rosuvastatin (CRESTOR) 40 MG tablet Take 1 tablet (40 mg total) by mouth at bedtime. 90 tablet 3   Specialty Vitamins Products (ONE-A-DAY BONE STRENGTH PO) Take 1 tablet by mouth daily.      ALPRAZolam (XANAX) 0.5 MG tablet Take 1 tablet (0.5 mg total) by mouth daily as needed for anxiety or sleep. (Patient not taking: Reported on 10/13/2021) 30 tablet 0   predniSONE (DELTASONE) 10 MG tablet Take 4 tabs qd x 2 days, 3 qd x 2 days, 2 qd x 2d, 1qd x 3 days (Patient not taking: Reported on 10/13/2021) 21 tablet 0   triamcinolone cream (KENALOG) 0.1 % Apply 1 application topically daily as needed. (Patient not taking: Reported on 10/13/2021) 453 g 3   No current facility-administered medications for this encounter.    Physical Findings:  height is _0  (1.626 m) and weight is 153 lb 6 oz (69.6 kg). Her blood pressure is 120/56 (abnormal) and her pulse is 67. Her respiration is 18 and oxygen saturation is 98%. .     General: Alert and oriented, in no acute distress Musculoskeletal: symmetric strength and muscle tone throughout. Neurologic: No obvious focalities. Speech is fluent.  Psychiatric: Judgment and insight are intact. Affect is appropriate. Breast exam reveals satisfactory healing from recent lumpectomy, left breast  Lab Findings: Lab Results  Component Value Date   WBC 7.0 08/18/2021   HGB 14.5 08/18/2021   HCT 40.3 08/18/2021   MCV 85.0 08/18/2021   PLT 276 08/18/2021    _1 @  Radiographic Findings: No results found.  Impression/Plan: We discussed adjuvant radiotherapy today.  Given the size of her tumor and lack of lymph node  surgery, I recommend 4 weeks of radiation using high tangents to the left breast and axilla in order to reduce risk of locoregional recurrence. As her staging increased, I explained why this approach is preferable to the ultra hypofractionated 1 week regimen.  I reviewed the logistics, benefits, risks, and potential side effects of this treatment in detail. Risks may include but not necessary be limited to acute and late injury tissue in the radiation fields such as skin irritation (change in color/pigmentation, itching, dryness, pain, peeling). She may experience fatigue. We also discussed possible risk of long term cosmetic changes or scar tissue. There is also a smaller risk for lung toxicity, cardiac toxicity, brachial plexopathy, lymphedema, musculoskeletal changes, rib fragility or induction of a second malignancy, late chronic non-healing soft tissue wound.     The patient asked good questions which I answered to her satisfaction. She is enthusiastic about proceeding with treatment. A consent form has been  signed and placed in her chart. Her sister  and daughter joined in the discussion and are supportive.    Proceed with CT simulation today, treatment in about 1 week.   She asked about nutrition. Discussed value of limited ETOH consumption to 3-4 drinks a week and maintaining weight. Avoid excessive antioxidants during RT.  On date of service, in total, I spent 35 minutes on this encounter. Patient was seen in person.  _____________________________________   Eppie Gibson, MD  This document serves as a record of services personally performed by Eppie Gibson, MD. It was created on her behalf by Roney Mans, a trained medical scribe. The creation of this record is based on the scribe's personal observations and the provider's statements to them. This document has been checked and approved by the attending provider.

## 2021-10-13 ENCOUNTER — Ambulatory Visit
Admission: RE | Admit: 2021-10-13 | Discharge: 2021-10-13 | Disposition: A | Discharge status: Home / Self Care | Payer: Medicare Other | Source: Ambulatory Visit | Attending: Radiation Oncology | Admitting: Radiation Oncology

## 2021-10-13 ENCOUNTER — Other Ambulatory Visit: Payer: Self-pay

## 2021-10-13 ENCOUNTER — Encounter: Payer: Self-pay | Admitting: Radiation Oncology

## 2021-10-13 VITALS — BP 120/56 | HR 67 | Resp 18 | Ht 64.0 in | Wt 153.4 lb

## 2021-10-13 DIAGNOSIS — Z791 Long term (current) use of non-steroidal anti-inflammatories (NSAID): Secondary | ICD-10-CM | POA: Diagnosis not present

## 2021-10-13 DIAGNOSIS — Z170 Estrogen receptor positive status [ER+]: Secondary | ICD-10-CM

## 2021-10-13 DIAGNOSIS — Z510 Encounter for antineoplastic radiation therapy: Secondary | ICD-10-CM | POA: Insufficient documentation

## 2021-10-13 DIAGNOSIS — C50812 Malignant neoplasm of overlapping sites of left female breast: Secondary | ICD-10-CM | POA: Insufficient documentation

## 2021-10-13 DIAGNOSIS — Z79899 Other long term (current) drug therapy: Secondary | ICD-10-CM | POA: Diagnosis not present

## 2021-10-13 NOTE — Progress Notes (Signed)
Location of Breast Cancer:  Malignant neoplasm of upper-inner quadrant of left breast in female, estrogen receptor positive   Histology per Pathology Report:  09/09/2021 FINAL MICROSCOPIC DIAGNOSIS:  A. BREAST, LEFT, LUMPECTOMY:  -  Invasive ductal carcinoma, Nottingham grade 2 of 3, 3.5 cm  -  Ductal carcinoma in-situ, intermediate grade  -  Margins uninvolved by carcinoma (<0.1 cm; anterior)  -  Previous biopsy site changes present   Receptor Status: ER(100%), PR (60%), Her2-neu (Negative via IHC), Ki-67(10-15%)  Did patient present with symptoms (if so, please note symptoms) or was this found on screening mammography?: Left diagnostic MM and [place] on 07/27/21 showed 1.8 cm mass at 9 o'clock, and 1 cm calcifications anterior to primary mass.   Past/Anticipated interventions by surgeon, if any: 09/09/2021 --Dr. Donnie Mesa Left bracketed radioactive seed localized lumpectomy  Past/Anticipated interventions by medical oncology, if any:  Under care of Dr. Truitt Merle 08/18/2021 Given the early stage disease, she likely need a lumpectomy. She is agreeable with that.  She was seen by Dr. Georgette Dover today and likely will proceed with surgery soon.  she would like an MRI to evaluate the extent of her disease. This is scheduled for 08/25/21. I discussed the role of a Oncotype Dx test on the surgical sample to determine her risk of recurrence and benefit of chemo. She would like to proceed.  Giving the strong ER and PR expression in her postmenopausal status, I recommend adjuvant endocrine therapy with aromatase inhibitor or Tamoxifen for a total of 5-10 years to reduce the risk of cancer recurrence.  Given her arthritis at baseline, I would recommend tamoxifen PLAN:  -we will call her with E-cadherin results -I prescribed Effexor for her today -breast MRI 08/25/21 -proceed with lumpectomy soon -Oncotype on her surgical sample  -I will see her back after surgery or after radiation   Lymphedema  issues, if any:  No    Pain issues, if any:  No   SAFETY ISSUES: Prior radiation? No Pacemaker/ICD? No Possible current pregnancy? No--hysterectomy Is the patient on methotrexate? No  Current Complaints / other details:   Vitals:   10/13/21 0849  BP: (!) 120/56  Pulse: 67  Resp: 18  SpO2: 98%  Weight: 69.6 kg  Height: _0  (1.626 m)

## 2021-10-15 ENCOUNTER — Encounter: Payer: Self-pay | Admitting: Family Medicine

## 2021-10-15 ENCOUNTER — Encounter: Payer: Self-pay | Admitting: Radiation Oncology

## 2021-10-19 DIAGNOSIS — Z170 Estrogen receptor positive status [ER+]: Secondary | ICD-10-CM | POA: Diagnosis not present

## 2021-10-19 DIAGNOSIS — C50812 Malignant neoplasm of overlapping sites of left female breast: Secondary | ICD-10-CM | POA: Diagnosis not present

## 2021-10-19 DIAGNOSIS — Z510 Encounter for antineoplastic radiation therapy: Secondary | ICD-10-CM | POA: Diagnosis not present

## 2021-10-20 ENCOUNTER — Other Ambulatory Visit: Payer: Self-pay

## 2021-10-20 ENCOUNTER — Ambulatory Visit
Admission: RE | Admit: 2021-10-20 | Discharge: 2021-10-20 | Disposition: A | Discharge status: Home / Self Care | Payer: Medicare Other | Source: Ambulatory Visit | Attending: Radiation Oncology | Admitting: Radiation Oncology

## 2021-10-20 DIAGNOSIS — Z170 Estrogen receptor positive status [ER+]: Secondary | ICD-10-CM | POA: Diagnosis not present

## 2021-10-20 DIAGNOSIS — Z510 Encounter for antineoplastic radiation therapy: Secondary | ICD-10-CM | POA: Diagnosis not present

## 2021-10-20 DIAGNOSIS — C50812 Malignant neoplasm of overlapping sites of left female breast: Secondary | ICD-10-CM | POA: Diagnosis not present

## 2021-10-21 ENCOUNTER — Telehealth: Payer: Self-pay | Admitting: Hematology

## 2021-10-21 ENCOUNTER — Encounter: Payer: Self-pay | Admitting: *Deleted

## 2021-10-21 ENCOUNTER — Ambulatory Visit
Admission: RE | Admit: 2021-10-21 | Discharge: 2021-10-21 | Disposition: A | Discharge status: Home / Self Care | Payer: Medicare Other | Source: Ambulatory Visit | Attending: Radiation Oncology | Admitting: Radiation Oncology

## 2021-10-21 DIAGNOSIS — Z510 Encounter for antineoplastic radiation therapy: Secondary | ICD-10-CM | POA: Diagnosis not present

## 2021-10-21 DIAGNOSIS — C50812 Malignant neoplasm of overlapping sites of left female breast: Secondary | ICD-10-CM | POA: Diagnosis not present

## 2021-10-21 DIAGNOSIS — Z170 Estrogen receptor positive status [ER+]: Secondary | ICD-10-CM | POA: Diagnosis not present

## 2021-10-21 NOTE — Telephone Encounter (Signed)
Sch per 2/9 inbasket, pt aware °

## 2021-10-22 ENCOUNTER — Ambulatory Visit
Admission: RE | Admit: 2021-10-22 | Discharge: 2021-10-22 | Disposition: A | Discharge status: Home / Self Care | Payer: Medicare Other | Source: Ambulatory Visit | Attending: Radiation Oncology | Admitting: Radiation Oncology

## 2021-10-22 ENCOUNTER — Other Ambulatory Visit: Payer: Self-pay

## 2021-10-22 DIAGNOSIS — Z510 Encounter for antineoplastic radiation therapy: Secondary | ICD-10-CM | POA: Diagnosis not present

## 2021-10-22 DIAGNOSIS — C50812 Malignant neoplasm of overlapping sites of left female breast: Secondary | ICD-10-CM | POA: Diagnosis not present

## 2021-10-22 DIAGNOSIS — Z170 Estrogen receptor positive status [ER+]: Secondary | ICD-10-CM | POA: Diagnosis not present

## 2021-10-25 ENCOUNTER — Other Ambulatory Visit: Payer: Self-pay

## 2021-10-25 ENCOUNTER — Ambulatory Visit
Admission: RE | Admit: 2021-10-25 | Discharge: 2021-10-25 | Disposition: A | Discharge status: Home / Self Care | Payer: Medicare Other | Source: Ambulatory Visit | Attending: Radiation Oncology | Admitting: Radiation Oncology

## 2021-10-25 DIAGNOSIS — Z510 Encounter for antineoplastic radiation therapy: Secondary | ICD-10-CM | POA: Diagnosis not present

## 2021-10-25 DIAGNOSIS — C50812 Malignant neoplasm of overlapping sites of left female breast: Secondary | ICD-10-CM | POA: Diagnosis not present

## 2021-10-25 DIAGNOSIS — Z170 Estrogen receptor positive status [ER+]: Secondary | ICD-10-CM

## 2021-10-25 MED ORDER — SONAFINE EX EMUL
1.0000 "application " | Freq: Two times a day (BID) | CUTANEOUS | Status: DC
  Administered 2021-10-25: 1 via TOPICAL

## 2021-10-25 NOTE — Progress Notes (Signed)
Pt here for patient teaching.    Pt given Radiation and You booklet, skin care instructions, and Sonafine. Jethro Poling is currently on back-order, patient instructed to use unscented/metallic free deodorant   Reviewed areas of pertinence such as fatigue, hair loss, skin changes, breast tenderness, and breast swelling .   Pt able to give teach back of to pat skin, use unscented/gentle soap, and drink plenty of water,apply Sonafine bid, avoid applying anything to skin within 4 hours of treatment, avoid wearing an under wire bra, and to use an electric razor if they must shave.   Pt demonstrated understanding and verbalizes understanding of information given and will contact nursing with any questions or concerns.    [email or web address]

## 2021-10-26 ENCOUNTER — Other Ambulatory Visit: Payer: Self-pay

## 2021-10-26 ENCOUNTER — Ambulatory Visit
Admission: RE | Admit: 2021-10-26 | Discharge: 2021-10-26 | Disposition: A | Discharge status: Home / Self Care | Payer: Medicare Other | Source: Ambulatory Visit | Attending: Radiation Oncology | Admitting: Radiation Oncology

## 2021-10-26 DIAGNOSIS — Z170 Estrogen receptor positive status [ER+]: Secondary | ICD-10-CM | POA: Diagnosis not present

## 2021-10-26 DIAGNOSIS — C50812 Malignant neoplasm of overlapping sites of left female breast: Secondary | ICD-10-CM | POA: Diagnosis not present

## 2021-10-26 DIAGNOSIS — Z510 Encounter for antineoplastic radiation therapy: Secondary | ICD-10-CM | POA: Diagnosis not present

## 2021-10-27 ENCOUNTER — Ambulatory Visit
Admission: RE | Admit: 2021-10-27 | Discharge: 2021-10-27 | Disposition: A | Discharge status: Home / Self Care | Payer: Medicare Other | Source: Ambulatory Visit | Attending: Radiation Oncology | Admitting: Radiation Oncology

## 2021-10-27 DIAGNOSIS — Z510 Encounter for antineoplastic radiation therapy: Secondary | ICD-10-CM | POA: Diagnosis not present

## 2021-10-27 DIAGNOSIS — Z170 Estrogen receptor positive status [ER+]: Secondary | ICD-10-CM | POA: Diagnosis not present

## 2021-10-27 DIAGNOSIS — C50812 Malignant neoplasm of overlapping sites of left female breast: Secondary | ICD-10-CM | POA: Diagnosis not present

## 2021-10-28 ENCOUNTER — Other Ambulatory Visit: Payer: Self-pay

## 2021-10-28 ENCOUNTER — Ambulatory Visit
Admission: RE | Admit: 2021-10-28 | Discharge: 2021-10-28 | Disposition: A | Discharge status: Home / Self Care | Payer: Medicare Other | Source: Ambulatory Visit | Attending: Radiation Oncology | Admitting: Radiation Oncology

## 2021-10-28 DIAGNOSIS — Z170 Estrogen receptor positive status [ER+]: Secondary | ICD-10-CM | POA: Diagnosis not present

## 2021-10-28 DIAGNOSIS — C50812 Malignant neoplasm of overlapping sites of left female breast: Secondary | ICD-10-CM | POA: Diagnosis not present

## 2021-10-28 DIAGNOSIS — Z510 Encounter for antineoplastic radiation therapy: Secondary | ICD-10-CM | POA: Diagnosis not present

## 2021-10-29 ENCOUNTER — Ambulatory Visit
Admission: RE | Admit: 2021-10-29 | Discharge: 2021-10-29 | Disposition: A | Discharge status: Home / Self Care | Payer: Medicare Other | Source: Ambulatory Visit | Attending: Radiation Oncology | Admitting: Radiation Oncology

## 2021-10-29 DIAGNOSIS — Z170 Estrogen receptor positive status [ER+]: Secondary | ICD-10-CM | POA: Diagnosis not present

## 2021-10-29 DIAGNOSIS — Z510 Encounter for antineoplastic radiation therapy: Secondary | ICD-10-CM | POA: Diagnosis not present

## 2021-10-29 DIAGNOSIS — C50812 Malignant neoplasm of overlapping sites of left female breast: Secondary | ICD-10-CM | POA: Diagnosis not present

## 2021-11-01 ENCOUNTER — Other Ambulatory Visit: Payer: Self-pay

## 2021-11-01 ENCOUNTER — Ambulatory Visit
Admission: RE | Admit: 2021-11-01 | Discharge: 2021-11-01 | Disposition: A | Discharge status: Home / Self Care | Payer: Medicare Other | Source: Ambulatory Visit | Attending: Radiation Oncology | Admitting: Radiation Oncology

## 2021-11-01 DIAGNOSIS — Z510 Encounter for antineoplastic radiation therapy: Secondary | ICD-10-CM | POA: Diagnosis not present

## 2021-11-01 DIAGNOSIS — C50812 Malignant neoplasm of overlapping sites of left female breast: Secondary | ICD-10-CM | POA: Diagnosis not present

## 2021-11-01 DIAGNOSIS — Z170 Estrogen receptor positive status [ER+]: Secondary | ICD-10-CM | POA: Diagnosis not present

## 2021-11-02 ENCOUNTER — Ambulatory Visit
Admission: RE | Admit: 2021-11-02 | Discharge: 2021-11-02 | Disposition: A | Discharge status: Home / Self Care | Payer: Medicare Other | Source: Ambulatory Visit | Attending: Radiation Oncology | Admitting: Radiation Oncology

## 2021-11-02 ENCOUNTER — Telehealth: Payer: Self-pay | Admitting: Pharmacist

## 2021-11-02 DIAGNOSIS — Z170 Estrogen receptor positive status [ER+]: Secondary | ICD-10-CM | POA: Diagnosis not present

## 2021-11-02 DIAGNOSIS — Z510 Encounter for antineoplastic radiation therapy: Secondary | ICD-10-CM | POA: Diagnosis not present

## 2021-11-02 DIAGNOSIS — C50812 Malignant neoplasm of overlapping sites of left female breast: Secondary | ICD-10-CM | POA: Diagnosis not present

## 2021-11-02 NOTE — Progress Notes (Signed)
Chronic Care Management Pharmacy Assistant   Name: Madison Clements  MRN: [id number] DOB: 12-12-1945   Reason for Encounter: General Adherence Call    Recent office visits:  None  Recent consult visits:  None  Hospital visits:  09/09/2021 Surgery Left bracketed radioactive seed localized lumpectomy  Medications: Outpatient Encounter Medications as of 11/02/2021  Medication Sig   ALPRAZolam (XANAX) 0.5 MG tablet Take 1 tablet (0.5 mg total) by mouth daily as needed for anxiety or sleep. (Patient not taking: Reported on 10/13/2021)   celecoxib (CELEBREX) 100 MG capsule TAKE 1 CAPSULE (100 MG TOTAL) BY MOUTH DAILY.   diltiazem (TIAZAC) 180 MG 24 hr capsule Take 1 capsule (180 mg total) by mouth daily.   folic acid (FOLVITE) 226 MCG tablet Take 400 mcg by mouth daily.   furosemide (LASIX) 40 MG tablet Take 1 tablet (40 mg total) daily. Take an extra 1/2 tablet (20 mg total) as needed. (Patient taking differently: 20 mg daily. Take 1 tablet (40 mg total) daily. Take an extra 1/2 tablet (20 mg total) as needed. Patient states that she is taking 20 mg currently)   lisinopril (ZESTRIL) 10 MG tablet Take 1 tablet (10 mg total) by mouth daily.   omeprazole (PRILOSEC) 20 MG capsule TAKE 1 CAPSULE (20 MG TOTAL) BY MOUTH DAILY.   predniSONE (DELTASONE) 10 MG tablet Take 4 tabs qd x 2 days, 3 qd x 2 days, 2 qd x 2d, 1qd x 3 days (Patient not taking: Reported on 10/13/2021)   Probiotic Product (ADVANCED PROBIOTIC PO) Take 1 capsule by mouth daily.   rosuvastatin (CRESTOR) 40 MG tablet Take 1 tablet (40 mg total) by mouth at bedtime.   Specialty Vitamins Products (ONE-A-DAY BONE STRENGTH PO) Take 1 tablet by mouth daily.    triamcinolone cream (KENALOG) 0.1 % Apply 1 application topically daily as needed. (Patient not taking: Reported on 10/13/2021)   No facility-administered encounter medications on file as of 11/02/2021.   Patient Questions: How are your cancer treatments?  Patient states so  for she hasn't had any difficult side effects besides going to bed early.  Have you had any problems with your pharmacy? Patient states she has not had any problems with her pharmacy.  What issues or side effects are you having with your medications? Patient denies any issues or side effects at this time.  What would you like me to pass along to Leata Mouse, CPP for him to help you with?  She states she will likely be put on Tamoxifen   What can we do to take care of you better? Patient did not have any suggestions. She is happy with her current level of care.  Care Gaps: Medicare Annual Wellness: Due now, last AWV 09/21/2020 Hemoglobin A1C: 5.4% on 07/28/2020 Colonoscopy: Next due on 08/14/2025 Dexa Scan: Next due in 11/14/2021 Mammogram: Next due on 01/19/2022  Future Appointments  Date Time Provider [place]  11/02/2021 10:15 AM [facility] [id number] [facility] None  11/03/2021 10:15 AM [facility] [id number] [facility] None  11/04/2021 10:15 AM [facility] [id number] [facility] None  11/05/2021 10:15 AM [facility] [id number] [facility] None  11/08/2021 10:30 AM [facility] [id number] [facility] None  11/09/2021 10:15 AM [facility] [id number] [facility] None  11/10/2021 10:15 AM [facility] [id number] [facility] None  11/11/2021 10:15 AM [facility] [id number] [facility] None  11/12/2021 10:15 AM [facility] [id number] [facility] None  11/15/2021 10:15 AM [facility] [id number] [facility] None  11/15/2021 11:00 AM Truitt Merle, MD [facility] None  11/16/2021 10:15 AM [facility] [id number] [facility] None  02/28/2022  3:45 PM [facility] CCM PHARMACIST [facility] PEC  03/16/2022  9:45 AM Warren Danes, PA-C [facility]  08/23/2022  9:15 AM Hayden Pedro, MD [facility] None   Star Rating Drugs: Lisinopril 10 mg last filled 09/29/2021 90 DS Rosuvastatin 40 mg last filled 09/29/2021 90 DS  April D Calhoun, [place] Pharmacist Assistant [phone]

## 2021-11-03 ENCOUNTER — Other Ambulatory Visit: Payer: Self-pay

## 2021-11-03 ENCOUNTER — Ambulatory Visit
Admission: RE | Admit: 2021-11-03 | Discharge: 2021-11-03 | Disposition: A | Discharge status: Home / Self Care | Payer: Medicare Other | Source: Ambulatory Visit | Attending: Radiation Oncology | Admitting: Radiation Oncology

## 2021-11-03 DIAGNOSIS — Z170 Estrogen receptor positive status [ER+]: Secondary | ICD-10-CM | POA: Diagnosis not present

## 2021-11-03 DIAGNOSIS — Z510 Encounter for antineoplastic radiation therapy: Secondary | ICD-10-CM | POA: Diagnosis not present

## 2021-11-03 DIAGNOSIS — C50812 Malignant neoplasm of overlapping sites of left female breast: Secondary | ICD-10-CM | POA: Diagnosis not present

## 2021-11-04 ENCOUNTER — Ambulatory Visit
Admission: RE | Admit: 2021-11-04 | Discharge: 2021-11-04 | Disposition: A | Discharge status: Home / Self Care | Payer: Medicare Other | Source: Ambulatory Visit | Attending: Radiation Oncology | Admitting: Radiation Oncology

## 2021-11-04 DIAGNOSIS — C50812 Malignant neoplasm of overlapping sites of left female breast: Secondary | ICD-10-CM | POA: Diagnosis not present

## 2021-11-04 DIAGNOSIS — Z510 Encounter for antineoplastic radiation therapy: Secondary | ICD-10-CM | POA: Diagnosis not present

## 2021-11-04 DIAGNOSIS — Z170 Estrogen receptor positive status [ER+]: Secondary | ICD-10-CM | POA: Diagnosis not present

## 2021-11-05 ENCOUNTER — Other Ambulatory Visit: Payer: Self-pay

## 2021-11-05 ENCOUNTER — Ambulatory Visit
Admission: RE | Admit: 2021-11-05 | Discharge: 2021-11-05 | Disposition: A | Discharge status: Home / Self Care | Payer: Medicare Other | Source: Ambulatory Visit | Attending: Radiation Oncology | Admitting: Radiation Oncology

## 2021-11-05 DIAGNOSIS — C50812 Malignant neoplasm of overlapping sites of left female breast: Secondary | ICD-10-CM | POA: Diagnosis not present

## 2021-11-05 DIAGNOSIS — Z170 Estrogen receptor positive status [ER+]: Secondary | ICD-10-CM | POA: Diagnosis not present

## 2021-11-05 DIAGNOSIS — Z510 Encounter for antineoplastic radiation therapy: Secondary | ICD-10-CM | POA: Diagnosis not present

## 2021-11-08 ENCOUNTER — Ambulatory Visit
Admission: RE | Admit: 2021-11-08 | Discharge: 2021-11-08 | Disposition: A | Discharge status: Home / Self Care | Payer: Medicare Other | Source: Ambulatory Visit | Attending: Radiation Oncology | Admitting: Radiation Oncology

## 2021-11-08 ENCOUNTER — Other Ambulatory Visit: Payer: Self-pay

## 2021-11-08 DIAGNOSIS — Z170 Estrogen receptor positive status [ER+]: Secondary | ICD-10-CM | POA: Diagnosis not present

## 2021-11-08 DIAGNOSIS — Z510 Encounter for antineoplastic radiation therapy: Secondary | ICD-10-CM | POA: Diagnosis not present

## 2021-11-08 DIAGNOSIS — C50812 Malignant neoplasm of overlapping sites of left female breast: Secondary | ICD-10-CM | POA: Diagnosis not present

## 2021-11-08 MED ORDER — RADIAPLEXRX EX GEL: Freq: Once | CUTANEOUS | Status: AC

## 2021-11-09 ENCOUNTER — Ambulatory Visit
Admission: RE | Admit: 2021-11-09 | Discharge: 2021-11-09 | Disposition: A | Discharge status: Home / Self Care | Payer: Medicare Other | Source: Ambulatory Visit | Attending: Radiation Oncology | Admitting: Radiation Oncology

## 2021-11-09 DIAGNOSIS — Z510 Encounter for antineoplastic radiation therapy: Secondary | ICD-10-CM | POA: Diagnosis not present

## 2021-11-09 DIAGNOSIS — Z170 Estrogen receptor positive status [ER+]: Secondary | ICD-10-CM | POA: Diagnosis not present

## 2021-11-09 DIAGNOSIS — C50812 Malignant neoplasm of overlapping sites of left female breast: Secondary | ICD-10-CM | POA: Diagnosis not present

## 2021-11-10 ENCOUNTER — Ambulatory Visit
Admission: RE | Admit: 2021-11-10 | Discharge: 2021-11-10 | Disposition: A | Discharge status: Home / Self Care | Payer: Medicare Other | Source: Ambulatory Visit | Attending: Radiation Oncology | Admitting: Radiation Oncology

## 2021-11-10 ENCOUNTER — Other Ambulatory Visit: Payer: Self-pay

## 2021-11-10 DIAGNOSIS — Z170 Estrogen receptor positive status [ER+]: Secondary | ICD-10-CM | POA: Insufficient documentation

## 2021-11-10 DIAGNOSIS — C50812 Malignant neoplasm of overlapping sites of left female breast: Secondary | ICD-10-CM | POA: Diagnosis not present

## 2021-11-11 ENCOUNTER — Ambulatory Visit
Admission: RE | Admit: 2021-11-11 | Discharge: 2021-11-11 | Disposition: A | Discharge status: Home / Self Care | Payer: Medicare Other | Source: Ambulatory Visit | Attending: Radiation Oncology | Admitting: Radiation Oncology

## 2021-11-11 DIAGNOSIS — Z170 Estrogen receptor positive status [ER+]: Secondary | ICD-10-CM | POA: Diagnosis not present

## 2021-11-11 DIAGNOSIS — C50812 Malignant neoplasm of overlapping sites of left female breast: Secondary | ICD-10-CM | POA: Diagnosis not present

## 2021-11-12 ENCOUNTER — Ambulatory Visit
Admission: RE | Admit: 2021-11-12 | Discharge: 2021-11-12 | Disposition: A | Discharge status: Home / Self Care | Payer: Medicare Other | Source: Ambulatory Visit | Attending: Radiation Oncology | Admitting: Radiation Oncology

## 2021-11-12 ENCOUNTER — Other Ambulatory Visit: Payer: Self-pay

## 2021-11-12 DIAGNOSIS — C50812 Malignant neoplasm of overlapping sites of left female breast: Secondary | ICD-10-CM | POA: Diagnosis not present

## 2021-11-12 DIAGNOSIS — Z170 Estrogen receptor positive status [ER+]: Secondary | ICD-10-CM | POA: Diagnosis not present

## 2021-11-15 ENCOUNTER — Other Ambulatory Visit: Payer: Self-pay

## 2021-11-15 ENCOUNTER — Inpatient Hospital Stay (HOSPITAL_BASED_OUTPATIENT_CLINIC_OR_DEPARTMENT_OTHER): Payer: Medicare Other | Admitting: Hematology

## 2021-11-15 ENCOUNTER — Encounter: Payer: Self-pay | Admitting: Hematology

## 2021-11-15 ENCOUNTER — Ambulatory Visit
Admission: RE | Admit: 2021-11-15 | Discharge: 2021-11-15 | Disposition: A | Discharge status: Home / Self Care | Payer: Medicare Other | Source: Ambulatory Visit | Attending: Radiation Oncology | Admitting: Radiation Oncology

## 2021-11-15 VITALS — BP 132/65 | HR 67 | Temp 98.1°F | Resp 18 | Ht 64.0 in | Wt 151.5 lb

## 2021-11-15 DIAGNOSIS — M1990 Unspecified osteoarthritis, unspecified site: Secondary | ICD-10-CM | POA: Insufficient documentation

## 2021-11-15 DIAGNOSIS — I10 Essential (primary) hypertension: Secondary | ICD-10-CM | POA: Insufficient documentation

## 2021-11-15 DIAGNOSIS — Z90710 Acquired absence of both cervix and uterus: Secondary | ICD-10-CM | POA: Insufficient documentation

## 2021-11-15 DIAGNOSIS — C50812 Malignant neoplasm of overlapping sites of left female breast: Secondary | ICD-10-CM | POA: Insufficient documentation

## 2021-11-15 DIAGNOSIS — F064 Anxiety disorder due to known physiological condition: Secondary | ICD-10-CM | POA: Insufficient documentation

## 2021-11-15 DIAGNOSIS — Z803 Family history of malignant neoplasm of breast: Secondary | ICD-10-CM | POA: Insufficient documentation

## 2021-11-15 DIAGNOSIS — Z800 Family history of malignant neoplasm of digestive organs: Secondary | ICD-10-CM | POA: Insufficient documentation

## 2021-11-15 DIAGNOSIS — Z170 Estrogen receptor positive status [ER+]: Secondary | ICD-10-CM | POA: Insufficient documentation

## 2021-11-15 DIAGNOSIS — M8580 Other specified disorders of bone density and structure, unspecified site: Secondary | ICD-10-CM | POA: Insufficient documentation

## 2021-11-15 MED ORDER — RADIAPLEXRX EX GEL: Freq: Once | CUTANEOUS | Status: AC

## 2021-11-15 MED ORDER — TAMOXIFEN CITRATE 20 MG PO TABS: 20.0000 mg | ORAL_TABLET | Freq: Every day | ORAL | 5 refills | Status: DC

## 2021-11-15 NOTE — Progress Notes (Signed)
[place]   Telephone:(336) [phone] Fax:(336) [phone]   Clinic Follow up Note   Patient Care Team: Leamon Arnt, MD as PCP - General (Family Medicine) Sueanne Margarita, MD as PCP - Cardiology (Cardiology) Sueanne Margarita, MD as Consulting Physician (Cardiology) Hayden Pedro, MD as Consulting Physician (Ophthalmology) Warren Danes, PA-C as Physician Assistant (Dermatology) Donnie Mesa, MD as Consulting Physician (General Surgery) Truitt Merle, MD as Consulting Physician (Hematology) Eppie Gibson, MD as Attending Physician (Radiation Oncology) Mauro Kaufmann, RN as Oncology Nurse Navigator Rockwell Germany, RN as Oncology Nurse Navigator Edythe Clarity, [facility] (Pharmacist)  Date of Service:  11/15/2021  CHIEF COMPLAINT: f/u of left breast cancer  CURRENT THERAPY:  Adjuvant radiation, 2/8-11/16/21  ASSESSMENT & PLAN:  Madison Clements is a 76 y.o. female with   1. 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 Biopsy 08/11/21 confirmed invasive mammary carcinoma. -left lumpectomy on 09/09/21 by Dr. 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 No nodes were removed due to her age. -Oncotype RS of 21, low risk -she is currently receiving adjuvant radiation under Dr. Isidore Moos, will finish tomorrow, 11/16/21. -Giving the strong ER and PR expression in her postmenopausal status, I recommend adjuvant endocrine therapy with aromatase inhibitor or Tamoxifen for a total of 5-10 years to reduce the risk of cancer recurrence. Given her arthritis at baseline, osteopenia, and history of hysterectomy, I would recommend tamoxifen.  --The potential side effects, which includes but not limited to, hot flash, skin and vaginal dryness, slightly increased risk of cardiovascular disease and cataract, small risk of thrombosis, were discussed with her in great  details. Preventive strategies for thrombosis, such as being physically active, using compression stocks, avoid cigarette smoking, etc., were reviewed with her. She voiced good understanding, and agrees to proceed. Will start after she completes adjuvant breast radiation. -We also discussed the breast cancer surveillance after her surgery. She will continue annual screening mammogram, self exam, and a routine office visit with lab and exam with [place]. -she requested a prescription for prosthesis bra given the size difference in her breasts s/p lumpectomy. I wrote for her today.   2. Osteopenia, Arthritis -Her most recent DEXA was 11/15/19 showing osteopenia (T-score -1.5). -she also has a lot of arthritis at baseline, is on Celebrex, and is s/p steroid injections to knees and hips. -she is now due for repeat DEXA, which I encouraged her to proceed with. I ordered for her today. -I discussed that tamoxifen can help strengthen her bones and overall causes less joint pain.   3. Social Support, anxiety  -she currently has ~10 drinks per week. I discussed the effect of alcohol on our health and encouraged her to limit her intake. -she reports new anxiety secondary to her diagnosis. She denies history of depression, anxiety, or medication use.     PLAN:  -complete radiation tomorrow, 3/7 -start tamoxifen in ~3 weeks, I called into her pharmacy today -DEXA to be done soon -survivorship in 3 months -lab and f/u in 6 months   No problem-specific Assessment & Plan notes found for this encounter.   SUMMARY OF ONCOLOGIC HISTORY: Oncology History Overview Note   Cancer Staging  Malignant neoplasm of overlapping sites of left breast in female, estrogen receptor positive ([place]) Staging form: Breast, AJCC 8th Edition - Clinical stage from 08/11/2021: Stage IA (cT1c, cN0,  cM0, G2, ER+, PR+, HER2-) - Signed by Truitt Merle, MD on 08/18/2021     Malignant neoplasm of overlapping sites of left breast in female,  estrogen receptor positive ([place])  07/27/2021 Mammogram   EXAM: DIGITAL DIAGNOSTIC UNILATERAL LEFT MAMMOGRAM WITH TOMOSYNTHESIS AND CAD; ULTRASOUND LEFT BREAST LIMITED  IMPRESSION: 1. There is a suspicious mass in the left breast at 9 o'clock measuring 1.8 cm.   2. There is a 1.0 cm group of suspicious linear calcifications anterior to the suspicious mass in the left breast, as well as a faint 2 mm group which lie 1 cm lateral to the linear calcifications.   3.  No evidence of left axillary lymphadenopathy   08/11/2021 Cancer Staging   Staging form: Breast, AJCC 8th Edition - Clinical stage from 08/11/2021: Stage IA (cT1c, cN0, cM0, G2, ER+, PR+, HER2-) - Signed by Truitt Merle, MD on 08/18/2021 Stage prefix: Initial diagnosis Histologic grading system: 3 grade system    08/11/2021 Initial Biopsy   Diagnosis 1. Breast, left, needle core biopsy, 9 o'clock, ribbon clip - INVASIVE MAMMARY CARCINOMA - SEE COMMENT 2. Breast, left, needle core biopsy, lower inner quadrant, x clip - INVASIVE MAMMARY CARCINOMA - MAMMARY CARCINOMA IN-SITU - CALCIFICATIONS - SEE COMMENT Microscopic Comment 1. The biopsy material shows an infiltrative proliferation of cells with arranged linearly and in small clusters. Based on the biopsy, the carcinoma appears Nottingham grade 2 of 3 and measures 1.2 cm in greatest linear extent. 2. Based on the biopsy, the carcinoma appears Nottingham grade 2 of 3 and measures 0.2 cm in greatest linear extent.  1. PROGNOSTIC INDICATORS Results: The tumor cells are NEGATIVE for Her2 (1+). Estrogen Receptor: 100%, POSITIVE, STRONG STAINING INTENSITY Progesterone Receptor: 60%, POSITIVE, STRONG STAINING INTENSITY Proliferation Marker Ki67: 10%  2. PROGNOSTIC INDICATORS Results: The tumor cells are NEGATIVE for Her2 (1+). Estrogen Receptor: 100%, POSITIVE, STRONG STAINING INTENSITY Progesterone Receptor: 60%, POSITIVE, STRONG STAINING INTENSITY Proliferation Marker  Ki67: 15%   08/13/2021 Mammogram   EXAM: DIGITAL DIAGNOSTIC UNILATERAL RIGHT MAMMOGRAM WITH TOMOSYNTHESIS AND CAD  IMPRESSION: No mammographic evidence for malignancy.   08/16/2021 Initial Diagnosis   Malignant neoplasm of overlapping sites of left breast in female, estrogen receptor positive ([place])   08/25/2021 Imaging   EXAM: BILATERAL BREAST MRI WITH AND WITHOUT CONTRAST  IMPRESSION: 1. Biopsy-proven invasive ductal carcinoma measuring approximately 2.8 x 1.7 x 1.5 cm in the inner breast at middle depth, associated with the ribbon shaped tissue marking clip placed at the time of core needle biopsy. Enhancement extends approximately 1.4 cm posterior to the clip. 2. Approximate 1.8 cm post biopsy hematoma with associated rim enhancement at the site of the biopsy-proven invasive ductal carcinoma and DCIS in the LOWER INNER QUADRANT at anterior depth associated with the X shaped tissue marking clip. 3. In combination, the overall enhancement spans approximately 4 cm. 4. No MRI evidence of malignancy involving the RIGHT breast. 5. No pathologic lymphadenopathy.   08/27/2021 Genetic Testing   egative hereditary cancer genetic testing: no pathogenic variants detected in [facility] BRCAPlus Panel and CancerNext-Expanded +RNAinsight Panel.  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  The report dates are 08/27/2021 and 08/30/2021.   The BRCAplus panel offered by [employer] and includes sequencing and deletion/duplication analysis for the following 8 genes: ATM, BRCA1, BRCA2, CDH1, CHEK2, PALB2, PTEN, and TP53.  The CancerNext-Expanded gene panel offered by [facility] and includes sequencing, rearrangement, and RNA analysis for the following 77 genes: AIP, ALK, APC,  ATM, AXIN2, BAP1, BARD1, BLM, BMPR1A, BRCA1, BRCA2, BRIP1, CDC73, CDH1, CDK4, CDKN1B, CDKN2A, CHEK2, CTNNA1, DICER1, FANCC, FH, FLCN, GALNT12, KIF1B, LZTR1, MAX, MEN1, MET, MLH1, MSH2, MSH3, MSH6,  MUTYH, NBN, NF1, NF2, NTHL1, PALB2, PHOX2B, PMS2, POT1, PRKAR1A, PTCH1, PTEN, RAD51C, RAD51D, RB1, RECQL, RET, SDHA, SDHAF2, SDHB, SDHC, SDHD, SMAD4, SMARCA4, SMARCB1, SMARCE1, STK11, SUFU, TMEM127, TP53, TSC1, TSC2, VHL and XRCC2 (sequencing and deletion/duplication); EGFR, EGLN1, HOXB13, KIT, MITF, PDGFRA, POLD1, and POLE (sequencing only); EPCAM and GREM1 (deletion/duplication only).    09/09/2021 Definitive Surgery   FINAL MICROSCOPIC DIAGNOSIS:   A. BREAST, LEFT, LUMPECTOMY:  -  Invasive ductal carcinoma, Nottingham grade 2 of 3, 3.5 cm  -  Ductal carcinoma in-situ, intermediate grade  -  Margins uninvolved by carcinoma (<0.1 cm; anterior)  -  Previous biopsy site changes present    09/09/2021 Oncotype testing   Oncotype DX was obtained on the final surgical sample and the recurrence score of 21 predicts a risk of recurrence outside the breast over the next 9 years of 7%, if the patient's only systemic therapy is an antiestrogen for 5 years.  It also predicts no benefit from chemotherapy.      INTERVAL HISTORY:  Madison Clements is here for a follow up of breast cancer. She was last seen by me on 08/18/21 in consultation. She presents to the clinic accompanied by her daughter. She reports she is tolerating radiation well overall and notes her skin is already improving since she moved to the boost treatment. She does report some fatigue, as expected. She also reports her surgery went very well.   All other systems were reviewed with the patient and are negative.  MEDICAL HISTORY:  Past Medical History:  Diagnosis Date   Allergy 1990   Morphine following surgery   Arthritis    generalized   Atypical mole 02/24/2015   RGHT LATERAL THIGH MODERATE   Atypical nevi 02/24/2015   RIGHT NECK MILD   Atypical nevi 08/25/2015   LEFT UPPER ARM MILD   Atypical nevi 08/25/2015   LEFT LATERAL FOREARM MILD/FREE   Atypical nevi 02/23/2016   LEFT THIGH MODERATE/FREE   Atypical nevi  02/23/2016   LEFT MEDIAL LEG MODERATE/FREE   Atypical nevi 02/23/2016   LEFT UPPER BACK MILD /FREE   Atypical nevi 08/23/2016   RIGHT ANT PROX THIGH MILD /FREE   Atypical nevi 07/25/2018   MID LOWER BACK MODERATE   Atypical nevi 01/22/2019   LEFT MID BACK MILD /FREE   Atypical nevi 01/22/2019   LEFT NECK MILD   Atypical nevi 08/06/2019   LEFT INNER BREAST MODERATE W/S   Atypical nevi 08/06/2019   LEFT OUTER SIDE MILD/FREE   Blood transfusion without reported diagnosis    hx of   Breast cancer ([place]) 2022   CAD (coronary artery disease), native coronary artery 02/07/2019   Minimal CAD at the time of cath in 2016 in [place]   Family history of breast cancer 08/19/2021   Family history of pancreatic cancer 08/19/2021   Fatty liver 05/21/2020   Gastric polyps 10/02/2018   EGD 03/2015; benign   GERD (gastroesophageal reflux disease) 10/02/2018   on meds   History of melanoma 10/02/2018   Abdomen 2012; 2013; s/p local excisions.    Hyperlipidemia    on meds   Hypertension    on meds   Osteopenia    Polyp of colon, adenomatous 10/02/2018   Colonoscopy 03/2015; recheck in 2021   PONV (postoperative nausea and  vomiting)    Pulmonary hypertension, primary ([place]) 10/02/2018   SCCA (squamous cell carcinoma) of skin 09/18/2017   RIGHT ANT. DISTAL LOWER LEG TREATED BY DR. Delton Coombes   Sleep apnea    Sleep apnea 05/26/2020   uses CPAP    SURGICAL HISTORY: Past Surgical History:  Procedure Laterality Date   ABDOMINAL HYSTERECTOMY  1990   APPENDECTOMY  1962   BLADDER SUSPENSION  1990   BREAST LUMPECTOMY WITH RADIOACTIVE SEED LOCALIZATION Left 09/09/2021   Procedure: LEFT BREAST LUMPECTOMY WITH RADIOACTIVE SEED LOCALIZATION X2;  Surgeon: Donnie Mesa, MD;  Location: [place];  Service: General;  Laterality: Left;   COLONOSCOPY  2016   in [place]-hx of polyps   EYE SURGERY  2013 torn retina laser   MENISCUS REPAIR Right 2001   right knee   thumb  surgery Left    TONSILLECTOMY AND ADENOIDECTOMY     tuabl ligation     TUBAL LIGATION  1975   WISDOM TOOTH EXTRACTION      I have reviewed the social history and family history with the patient and they are unchanged from previous note.  ALLERGIES:  is allergic to morphine and morphine and related.  MEDICATIONS:  Current Outpatient Medications  Medication Sig Dispense Refill   tamoxifen (NOLVADEX) 20 MG tablet Take 1 tablet (20 mg total) by mouth daily. 30 tablet 5   ALPRAZolam (XANAX) 0.5 MG tablet Take 1 tablet (0.5 mg total) by mouth daily as needed for anxiety or sleep. (Patient not taking: Reported on 10/13/2021) 30 tablet 0   celecoxib (CELEBREX) 100 MG capsule TAKE 1 CAPSULE (100 MG TOTAL) BY MOUTH DAILY. 90 capsule 3   diltiazem (TIAZAC) 180 MG 24 hr capsule Take 1 capsule (180 mg total) by mouth daily. 90 capsule 3   folic acid (FOLVITE) 742 MCG tablet Take 400 mcg by mouth daily.     furosemide (LASIX) 40 MG tablet Take 1 tablet (40 mg total) daily. Take an extra 1/2 tablet (20 mg total) as needed. (Patient taking differently: 20 mg daily. Take 1 tablet (40 mg total) daily. Take an extra 1/2 tablet (20 mg total) as needed. Patient states that she is taking 20 mg currently) 135 tablet 3   lisinopril (ZESTRIL) 10 MG tablet Take 1 tablet (10 mg total) by mouth daily. 90 tablet 3   omeprazole (PRILOSEC) 20 MG capsule TAKE 1 CAPSULE (20 MG TOTAL) BY MOUTH DAILY. 90 capsule 3   predniSONE (DELTASONE) 10 MG tablet Take 4 tabs qd x 2 days, 3 qd x 2 days, 2 qd x 2d, 1qd x 3 days (Patient not taking: Reported on 10/13/2021) 21 tablet 0   Probiotic Product (ADVANCED PROBIOTIC PO) Take 1 capsule by mouth daily.     rosuvastatin (CRESTOR) 40 MG tablet Take 1 tablet (40 mg total) by mouth at bedtime. 90 tablet 3   Specialty Vitamins Products (ONE-A-DAY BONE STRENGTH PO) Take 1 tablet by mouth daily.      triamcinolone cream (KENALOG) 0.1 % Apply 1 application topically daily as needed. (Patient not  taking: Reported on 10/13/2021) 453 g 3   No current facility-administered medications for this visit.    PHYSICAL EXAMINATION: ECOG PERFORMANCE STATUS: 0 - Asymptomatic  Vitals:   11/15/21 1121  BP: 132/65  Pulse: 67  Resp: 18  Temp: 98.1 F (36.7 C)  SpO2: 100%   Wt Readings from Last 3 Encounters:  11/15/21 151 lb 8 oz (68.7 kg)  10/13/21 153 lb 6 oz (69.6  kg)  09/09/21 153 lb 10.6 oz (69.7 kg)     GENERAL:alert, no distress and comfortable SKIN: skin color normal, no rashes or significant lesions EYES: normal, Conjunctiva are pink and non-injected, sclera clear  NEURO: alert & oriented x 3 with fluent speech  LABORATORY DATA:  I have reviewed the data as listed CBC Latest Ref Rng & Units 08/18/2021 07/09/2021 07/28/2020  WBC 4.0 - 10.5 K/uL 7.0 5.1 4.4  Hemoglobin 12.0 - 15.0 g/dL 14.5 14.2 14.5  Hematocrit 36.0 - 46.0 % 40.3 41.1 43.8  Platelets 150 - 400 K/uL 276 277.0 297     CMP Latest Ref Rng & Units 08/18/2021 07/09/2021 07/28/2020  Glucose 70 - 99 mg/dL 99 91 98  BUN 8 - 23 mg/dL _0 Creatinine 0.44 - 1.00 mg/dL 0.89 0.93 1.01(H)  Sodium 135 - 145 mmol/L 136 137 141  Potassium 3.5 - 5.1 mmol/L 3.6 4.3 3.7  Chloride 98 - 111 mmol/L 104 99 106  CO2 22 - 32 mmol/L _1 Calcium 8.9 - 10.3 mg/dL 9.4 10.2 10.2  Total Protein 6.5 - 8.1 g/dL 7.1 6.8 6.3  Total Bilirubin 0.3 - 1.2 mg/dL 1.2 1.1 0.7  Alkaline Phos 38 - 126 U/L 110 82 -  AST 15 - 41 U/L _2 ALT 0 - 44 U/L _3 RADIOGRAPHIC STUDIES: I have personally reviewed the radiological images as listed and agreed with the findings in the report. No results found.    Orders Placed This Encounter  Procedures   MM DIAG BREAST TOMO BILATERAL    Standing Status:   Future    Standing Expiration Date:   11/16/2022    Order Specific Question:   Reason for Exam (SYMPTOM  OR DIAGNOSIS REQUIRED)    Answer:   screening    Order Specific Question:   Preferred imaging location?     Answer:   [facility]   CBC with Differential/Platelet    Standing Status:   Standing    Number of Occurrences:   50    Standing Expiration Date:   11/16/2022   Comprehensive metabolic panel    Standing Status:   Standing    Number of Occurrences:   50    Standing Expiration Date:   11/16/2022   All questions were answered. The patient knows to call the clinic with any problems, questions or concerns. No barriers to learning was detected. The total time spent in the appointment was 30 minutes.     Truitt Merle, MD 11/15/2021   I, Wilburn Mylar, am acting as scribe for Truitt Merle, MD.   I have reviewed the above documentation for accuracy and completeness, and I agree with the above.

## 2021-11-16 ENCOUNTER — Ambulatory Visit
Admission: RE | Admit: 2021-11-16 | Discharge: 2021-11-16 | Disposition: A | Discharge status: Home / Self Care | Payer: Medicare Other | Source: Ambulatory Visit | Attending: Radiation Oncology | Admitting: Radiation Oncology

## 2021-11-16 ENCOUNTER — Encounter: Payer: Self-pay | Admitting: Radiation Oncology

## 2021-11-16 DIAGNOSIS — C50812 Malignant neoplasm of overlapping sites of left female breast: Secondary | ICD-10-CM | POA: Diagnosis not present

## 2021-11-16 DIAGNOSIS — Z170 Estrogen receptor positive status [ER+]: Secondary | ICD-10-CM | POA: Diagnosis not present

## 2021-11-17 ENCOUNTER — Encounter (HOSPITAL_COMMUNITY): Payer: Self-pay

## 2021-11-18 ENCOUNTER — Encounter: Payer: Self-pay | Admitting: *Deleted

## 2021-11-29 ENCOUNTER — Other Ambulatory Visit: Payer: Self-pay | Admitting: Hematology

## 2021-11-29 ENCOUNTER — Encounter: Payer: Self-pay | Admitting: Hematology

## 2021-11-29 DIAGNOSIS — E2839 Other primary ovarian failure: Secondary | ICD-10-CM

## 2021-12-24 NOTE — Progress Notes (Signed)
? ?                                                                                                                                                          ?  Patient Name: Madison Clements ?MRN: [id number] ?DOB: 1946-08-08 ?Referring Physician: Truitt Merle (Profile Not Attached) ?Date of Service: 11/16/2021 ?[facility] [place] ? ?                                                      End Of Treatment Note ? ?Diagnoses: C50.812-Malignant neoplasm of overlapping sites of left female breast ? ?Cancer Staging:  Cancer Staging  ?Malignant neoplasm of overlapping sites of left breast in female, estrogen receptor positive ([place]) ?Staging form: Breast, AJCC 8th Edition ?- Clinical stage from 08/11/2021: Stage IA (cT1c, cN0, cM0, G2, ER+, PR+, HER2-) - Signed by Truitt Merle, MD on 08/18/2021 ?Stage prefix: Initial diagnosis ?Histologic grading system: 3 grade system ? ?pT2, pN not assigned  ? ?Intent: Curative ? ?Radiation Treatment Dates: 10/20/2021 through 11/16/2021 ?Site Technique Total Dose (Gy) Dose per Fx (Gy) Completed Fx Beam Energies  ?Breast, Left: Breast_L_axilla 3D 40.05/40.05 2.67 15/15 10X  ?Breast, Left: Breast_L_Bst 3D 10/10 2 5/5 6X, 10X  ? ?Narrative: The patient tolerated radiation therapy relatively well.  ? ?Plan: The patient will follow-up with radiation oncology in [place] as needed. ?----------------------------------- ? ?SEppie Gibson MD ? ?

## 2021-12-28 ENCOUNTER — Ambulatory Visit (INDEPENDENT_AMBULATORY_CARE_PROVIDER_SITE_OTHER): Payer: Medicare Other

## 2021-12-28 DIAGNOSIS — Z0000 Encounter for general adult medical examination without abnormal findings: Secondary | ICD-10-CM

## 2021-12-28 NOTE — Patient Instructions (Signed)
Madison Clements , ?Thank you for taking time to come for your Medicare Wellness Visit. I appreciate your ongoing commitment to your health goals. Please review the following plan we discussed and let me know if I can assist you in the future.  ? ?Screening recommendations/referrals: ?Colonoscopy: Done 08/14/20 repeat every 5 years  ?Mammogram: Done 08/13/21 repeat every years scheduled 01/20/22 ?Bone Density: Done 11/15/19 repeat every 2 years scheduled 05/18/22 ?Recommended yearly ophthalmology/optometry visit for glaucoma screening and checkup ?Recommended yearly dental visit for hygiene and checkup ? ?Vaccinations: ?Influenza vaccine: Done 07/09/21 repeat every year  ?Pneumococcal vaccine: Up to date ?Tdap vaccine: not a candidate  ?Shingles vaccine: Shingrix discussed. Please contact your pharmacy for coverage information.    ?Covid-19:Completed 1/15, 2/5, &07/03/20 ? ?Advanced directives: Copies in chart ? ?Conditions/risks identified: Conquer fatigue and increase activity ? ?Next appointment: Follow up in one year for your annual wellness visit  ? ? ?Preventive Care 78 Years and Older, Female ?Preventive care refers to lifestyle choices and visits with your health care provider that can promote health and wellness. ?What does preventive care include? ?A yearly physical exam. This is also called an annual well check. ?Dental exams once or twice a year. ?Routine eye exams. Ask your health care provider how often you should have your eyes checked. ?Personal lifestyle choices, including: ?Daily care of your teeth and gums. ?Regular physical activity. ?Eating a healthy diet. ?Avoiding tobacco and drug use. ?Limiting alcohol use. ?Practicing safe sex. ?Taking low-dose aspirin every day. ?Taking vitamin and mineral supplements as recommended by your health care provider. ?What happens during an annual well check? ?The services and screenings done by your health care provider during your annual well check will depend on your age,  overall health, lifestyle risk factors, and family history of disease. ?Counseling  ?Your health care provider may ask you questions about your: ?Alcohol use. ?Tobacco use. ?Drug use. ?Emotional well-being. ?Home and relationship well-being. ?Sexual activity. ?Eating habits. ?History of falls. ?Memory and ability to understand (cognition). ?Work and work Statistician. ?Reproductive health. ?Screening  ?You may have the following tests or measurements: ?Height, weight, and BMI. ?Blood pressure. ?Lipid and cholesterol levels. These may be checked every 5 years, or more frequently if you are over 20 years old. ?Skin check. ?Lung cancer screening. You may have this screening every year starting at age 16 if you have a 30-pack-year history of smoking and currently smoke or have quit within the past 15 years. ?Fecal occult blood test (FOBT) of the stool. You may have this test every year starting at age 34. ?Flexible sigmoidoscopy or colonoscopy. You may have a sigmoidoscopy every 5 years or a colonoscopy every 10 years starting at age 36. ?Hepatitis C blood test. ?Hepatitis B blood test. ?Sexually transmitted disease (STD) testing. ?Diabetes screening. This is done by checking your blood sugar (glucose) after you have not eaten for a while (fasting). You may have this done every 1-3 years. ?Bone density scan. This is done to screen for osteoporosis. You may have this done starting at age 10. ?Mammogram. This may be done every 1-2 years. Talk to your health care provider about how often you should have regular mammograms. ?Talk with your health care provider about your test results, treatment options, and if necessary, the need for more tests. ?Vaccines  ?Your health care provider may recommend certain vaccines, such as: ?Influenza vaccine. This is recommended every year. ?Tetanus, diphtheria, and acellular pertussis (Tdap, Td) vaccine. You may need a Td booster  every 10 years. ?Zoster vaccine. You may need this after age  68. ?Pneumococcal 13-valent conjugate (PCV13) vaccine. One dose is recommended after age 80. ?Pneumococcal polysaccharide (PPSV23) vaccine. One dose is recommended after age 40. ?Talk to your health care provider about which screenings and vaccines you need and how often you need them. ?This information is not intended to replace advice given to you by your health care provider. Make sure you discuss any questions you have with your health care provider. ?Document Released: 09/25/2015 Document Revised: 05/18/2016 Document Reviewed: 06/30/2015 ?Elsevier Interactive Patient Education ? 2017 [place]. ? ?Fall Prevention in the Home ?Falls can cause injuries. They can happen to people of all ages. There are many things you can do to make your home safe and to help prevent falls. ?What can I do on the outside of my home? ?Regularly fix the edges of walkways and driveways and fix any cracks. ?Remove anything that might make you trip as you walk through a door, such as a raised step or threshold. ?Trim any bushes or trees on the path to your home. ?Use bright outdoor lighting. ?Clear any walking paths of anything that might make someone trip, such as rocks or tools. ?Regularly check to see if handrails are loose or broken. Make sure that both sides of any steps have handrails. ?Any raised decks and porches should have guardrails on the edges. ?Have any leaves, snow, or ice cleared regularly. ?Use sand or salt on walking paths during winter. ?Clean up any spills in your garage right away. This includes oil or grease spills. ?What can I do in the bathroom? ?Use night lights. ?Install grab bars by the toilet and in the tub and shower. Do not use towel bars as grab bars. ?Use non-skid mats or decals in the tub or shower. ?If you need to sit down in the shower, use a plastic, non-slip stool. ?Keep the floor dry. Clean up any water that spills on the floor as soon as it happens. ?Remove soap buildup in the tub or shower  regularly. ?Attach bath mats securely with double-sided non-slip rug tape. ?Do not have throw rugs and other things on the floor that can make you trip. ?What can I do in the bedroom? ?Use night lights. ?Make sure that you have a light by your bed that is easy to reach. ?Do not use any sheets or blankets that are too big for your bed. They should not hang down onto the floor. ?Have a firm chair that has side arms. You can use this for support while you get dressed. ?Do not have throw rugs and other things on the floor that can make you trip. ?What can I do in the kitchen? ?Clean up any spills right away. ?Avoid walking on wet floors. ?Keep items that you use a lot in easy-to-reach places. ?If you need to reach something above you, use a strong step stool that has a grab bar. ?Keep electrical cords out of the way. ?Do not use floor polish or wax that makes floors slippery. If you must use wax, use non-skid floor wax. ?Do not have throw rugs and other things on the floor that can make you trip. ?What can I do with my stairs? ?Do not leave any items on the stairs. ?Make sure that there are handrails on both sides of the stairs and use them. Fix handrails that are broken or loose. Make sure that handrails are as long as the stairways. ?Check any carpeting to  make sure that it is firmly attached to the stairs. Fix any carpet that is loose or worn. ?Avoid having throw rugs at the top or bottom of the stairs. If you do have throw rugs, attach them to the floor with carpet tape. ?Make sure that you have a light switch at the top of the stairs and the bottom of the stairs. If you do not have them, ask someone to add them for you. ?What else can I do to help prevent falls? ?Wear shoes that: ?Do not have high heels. ?Have rubber bottoms. ?Are comfortable and fit you well. ?Are closed at the toe. Do not wear sandals. ?If you use a stepladder: ?Make sure that it is fully opened. Do not climb a closed stepladder. ?Make sure that  both sides of the stepladder are locked into place. ?Ask someone to hold it for you, if possible. ?Clearly mark and make sure that you can see: ?Any grab bars or handrails. ?First and last steps. ?Where

## 2021-12-28 NOTE — Progress Notes (Addendum)
Virtual Visit via Telephone Note ? ?I connected with  Madison Clements on 12/28/21 at 11:15 AM EDT by telephone and verified that I am speaking with the correct person using two identifiers. ? ?Medicare Annual Wellness visit completed telephonically due to Covid-19 pandemic.  ? ?Persons participating in this call: This Health Coach and this patient.  ? ?Location: ?Patient: home ?Provider: office ?  ?I discussed the limitations, risks, security and privacy concerns of performing an evaluation and management service by telephone and the availability of in person appointments. The patient expressed understanding and agreed to proceed. ? ?Unable to perform video visit due to video visit attempted and failed and/or patient does not have video capability.  ? ?Some vital signs may be absent or patient reported.  ? ?Willette Brace, LPN ? ? ?Subjective:  ? Madison Clements is a 76 y.o. female who presents for Medicare Annual (Subsequent) preventive examination. ? ?Review of Systems    ? ?Cardiac Risk Factors include: advanced age (>29mn, >>58women);dyslipidemia;hypertension ? ?   ?Objective:  ?  ?There were no vitals filed for this visit. ?There is no height or weight on file to calculate BMI. ? ? ?  12/28/2021  ? 10:49 AM 10/13/2021  ?  8:48 AM 09/09/2021  ?  7:39 AM 08/31/2021  ? 11:22 AM 09/21/2020  ? 10:22 AM 05/26/2020  ?  8:41 PM 08/02/2019  ? 11:14 AM  ?Advanced Directives  ?Does Patient Have a Medical Advance Directive? Yes Yes Yes Yes Yes Yes Yes  ?Type of AP[place]of Attorney Living [place] Healthcare Power of [place] will Living will;Healthcare Power of Attorney  ?Does patient want to make changes to medical advance directive?   No - Patient declined No - Patient declined  No - Patient declined No - Patient declined  ?Copy of [place]? Yes  - validated most recent copy scanned in chart (See row information)  Yes - validated most recent copy scanned in chart (See row information)  Yes - validated most recent copy scanned in chart (See row information) No - copy requested Yes - validated most recent copy scanned in chart (See row information)  ? ? ?Current Medications (verified) ?Outpatient Encounter Medications as of 12/28/2021  ?Medication Sig  ? celecoxib (CELEBREX) 100 MG capsule TAKE 1 CAPSULE (100 MG TOTAL) BY MOUTH DAILY.  ? diltiazem (TIAZAC) 180 MG 24 hr capsule Take 1 capsule (180 mg total) by mouth daily.  ? folic acid (FOLVITE) 4267MCG tablet Take 400 mcg by mouth daily.  ? furosemide (LASIX) 40 MG tablet Take 1 tablet (40 mg total) daily. Take an extra 1/2 tablet (20 mg total) as needed. (Patient taking differently: 20 mg daily. Take 1 tablet (40 mg total) daily. Take an extra 1/2 tablet (20 mg total) as needed. Patient states that she is taking 20 mg currently)  ? lisinopril (ZESTRIL) 10 MG tablet Take 1 tablet (10 mg total) by mouth daily.  ? omeprazole (PRILOSEC) 20 MG capsule TAKE 1 CAPSULE (20 MG TOTAL) BY MOUTH DAILY.  ? Probiotic Product (ADVANCED PROBIOTIC PO) Take 1 capsule by mouth daily.  ? rosuvastatin (CRESTOR) 40 MG tablet Take 1 tablet (40 mg total) by mouth at bedtime.  ? Specialty Vitamins Products (ONE-A-DAY BONE STRENGTH PO) Take 1 tablet by mouth daily.   ? tamoxifen (NOLVADEX) 20 MG tablet Take 1 tablet (20 mg  total) by mouth daily.  ? predniSONE (DELTASONE) 10 MG tablet Take 4 tabs qd x 2 days, 3 qd x 2 days, 2 qd x 2d, 1qd x 3 days (Patient not taking: Reported on 10/13/2021)  ? [DISCONTINUED] ALPRAZolam (XANAX) 0.5 MG tablet Take 1 tablet (0.5 mg total) by mouth daily as needed for anxiety or sleep. (Patient not taking: Reported on 10/13/2021)  ? [DISCONTINUED] triamcinolone cream (KENALOG) 0.1 % Apply 1 application topically daily as needed. (Patient not taking: Reported on 10/13/2021)  ? ?No facility-administered encounter  medications on file as of 12/28/2021.  ? ? ?Allergies (verified) ?Morphine and Morphine and related  ? ?History: ?Past Medical History:  ?Diagnosis Date  ? Allergy 1990  ? Morphine following surgery  ? Arthritis   ? generalized  ? Atypical mole 02/24/2015  ? RGHT LATERAL THIGH MODERATE  ? Atypical nevi 02/24/2015  ? RIGHT NECK MILD  ? Atypical nevi 08/25/2015  ? LEFT UPPER ARM MILD  ? Atypical nevi 08/25/2015  ? LEFT LATERAL FOREARM MILD/FREE  ? Atypical nevi 02/23/2016  ? LEFT THIGH MODERATE/FREE  ? Atypical nevi 02/23/2016  ? LEFT MEDIAL LEG MODERATE/FREE  ? Atypical nevi 02/23/2016  ? LEFT UPPER BACK MILD /FREE  ? Atypical nevi 08/23/2016  ? RIGHT ANT PROX THIGH MILD /FREE  ? Atypical nevi 07/25/2018  ? MID LOWER BACK MODERATE  ? Atypical nevi 01/22/2019  ? LEFT MID BACK MILD /FREE  ? Atypical nevi 01/22/2019  ? LEFT NECK MILD  ? Atypical nevi 08/06/2019  ? LEFT INNER BREAST MODERATE W/S  ? Atypical nevi 08/06/2019  ? LEFT OUTER SIDE MILD/FREE  ? Blood transfusion without reported diagnosis   ? hx of  ? Breast cancer ([place]) 2022  ? CAD (coronary artery disease), native coronary artery 02/07/2019  ? Minimal CAD at the time of cath in 2016 in [place]  ? Family history of breast cancer 08/19/2021  ? Family history of pancreatic cancer 08/19/2021  ? Fatty liver 05/21/2020  ? Gastric polyps 10/02/2018  ? EGD 03/2015; benign  ? GERD (gastroesophageal reflux disease) 10/02/2018  ? on meds  ? History of melanoma 10/02/2018  ? Abdomen 2012; 2013; s/p local excisions.   ? Hyperlipidemia   ? on meds  ? Hypertension   ? on meds  ? Osteopenia   ? Polyp of colon, adenomatous 10/02/2018  ? Colonoscopy 03/2015; recheck in 2021  ? PONV (postoperative nausea and vomiting)   ? Pulmonary hypertension, primary ([place]) 10/02/2018  ? SCCA (squamous cell carcinoma) of skin 09/18/2017  ? RIGHT ANT. DISTAL LOWER LEG TREATED BY DR. Delton Coombes  ? Sleep apnea   ? Sleep apnea 05/26/2020  ? uses CPAP  ? ?Past Surgical History:   ?Procedure Laterality Date  ? ABDOMINAL HYSTERECTOMY  1990  ? APPENDECTOMY  1962  ? BLADDER SUSPENSION  1990  ? BREAST LUMPECTOMY WITH RADIOACTIVE SEED LOCALIZATION Left 09/09/2021  ? Procedure: LEFT BREAST LUMPECTOMY WITH RADIOACTIVE SEED LOCALIZATION X2;  Surgeon: Donnie Mesa, MD;  Location: [facility];  Service: General;  Laterality: Left;  ? COLONOSCOPY  2016  ? in [place]-hx of polyps  ? EYE SURGERY  2013 torn retina laser  ? MENISCUS REPAIR Right 2001  ? right knee  ? thumb surgery Left   ? TONSILLECTOMY AND ADENOIDECTOMY    ? tuabl ligation    ? TUBAL LIGATION  1975  ? WISDOM TOOTH EXTRACTION    ? ?Family History  ?Problem Relation Age of Onset  ?  Arthritis Mother   ? High blood pressure Mother   ? Hyperlipidemia Mother   ? Hypertension Mother   ? Stroke Mother   ? Varicose Veins Mother   ? Heart attack Father   ? Heart disease Father   ? High blood pressure Father   ? Hypertension Father   ? Arthritis Sister   ? Diabetes Sister   ? High Cholesterol Sister   ? High blood pressure Sister   ? Breast cancer Sister 38  ? Varicose Veins Sister   ? Pancreatic cancer Maternal Aunt   ?     dx early 22s  ? Heart disease Maternal Uncle   ? Heart disease Maternal Uncle   ? Stroke Paternal Aunt   ? Early death Maternal Grandmother   ?     Stroke at 46  ? High blood pressure Maternal Grandmother   ? Hypertension Maternal Grandmother   ? Stroke Maternal Grandmother   ? Asthma Maternal Grandfather   ? Kidney disease Maternal Grandfather   ? Arthritis Paternal Grandmother   ? Heart disease Paternal Grandmother   ? High blood pressure Paternal Grandmother   ? Hypertension Paternal Grandmother   ? Kidney disease Paternal Grandfather   ? Asthma Paternal Grandfather   ? COPD Paternal Grandfather   ? Prostate cancer Cousin   ?     paternal cousins x2; dx after 84  ? Colon polyps Neg Hx   ? Colon cancer Neg Hx   ? Esophageal cancer Neg Hx   ? Rectal cancer Neg Hx   ? Stomach cancer Neg Hx   ? ?Social History   ? ?Socioeconomic History  ? Marital status: Widowed  ?  Spouse name: Not on file  ? Number of children: 1  ? Years of education: Not on file  ? Highest education level: Not on file  ?Occupational History  ? [employer]

## 2021-12-31 ENCOUNTER — Telehealth: Payer: Self-pay

## 2021-12-31 ENCOUNTER — Ambulatory Visit: Admission: RE | Admit: 2021-12-31 | Payer: Medicare Other | Source: Ambulatory Visit | Admitting: Radiation Oncology

## 2021-12-31 NOTE — Telephone Encounter (Addendum)
I called the patient today about her upcoming follow-up appointment in radiation oncology.  ? ?Given the state of the COVID-19 pandemic, concerning case numbers in our community, and guidance from [facility], I offered a phone assessment with the patient to determine if coming to the clinic was necessary. She accepted. ? ?The patient denies any symptomatic concerns other than continued fatigue.  She feels the lingering fatigue is related to starting the tamoxifen she started at the beginning of the month. She denies any lingering pain, swelling, or tenderness to her breast. Reports full range of motion to her left arm/shoulder. Specifically, she reports good healing of her skin in the radiation fields.  She has noticed a couple of dark spots to the upper portion of her left breast. Given her history with melanoma she has made an appointment with her dermatologist to have dark spots evaluated. I recommended that she continue skin care by applying oil or lotion with vitamin E to the skin in the radiation fields, BID, for 2 more months.   ? ?Continue follow-up with medical oncology - follow-up is scheduled on 02/16/2022 with Annabelle Harman in the [facility].  I explained that yearly mammograms are important for patients with intact breast tissue, and physical exams are important after mastectomy for patients that cannot undergo mammography. She is already scheduled for a mammogram on 01/20/2022 ? ?I encouraged her to call if she had further questions or concerns about her healing. Otherwise, she will follow-up PRN in radiation oncology. Patient is pleased with this plan, and we will cancel her upcoming follow-up to reduce the risk of COVID-19 transmission. ? ? ?

## 2022-01-20 ENCOUNTER — Ambulatory Visit
Admission: RE | Admit: 2022-01-20 | Discharge: 2022-01-20 | Disposition: A | Discharge status: Home / Self Care | Payer: Medicare Other | Source: Ambulatory Visit | Attending: Hematology | Admitting: Hematology

## 2022-01-20 ENCOUNTER — Encounter: Payer: Self-pay | Admitting: Physician Assistant

## 2022-01-20 ENCOUNTER — Ambulatory Visit (INDEPENDENT_AMBULATORY_CARE_PROVIDER_SITE_OTHER): Payer: Medicare Other | Admitting: Physician Assistant

## 2022-01-20 DIAGNOSIS — D225 Melanocytic nevi of trunk: Secondary | ICD-10-CM

## 2022-01-20 DIAGNOSIS — Z1283 Encounter for screening for malignant neoplasm of skin: Secondary | ICD-10-CM | POA: Diagnosis not present

## 2022-01-20 DIAGNOSIS — Z85820 Personal history of malignant melanoma of skin: Secondary | ICD-10-CM

## 2022-01-20 DIAGNOSIS — R928 Other abnormal and inconclusive findings on diagnostic imaging of breast: Secondary | ICD-10-CM | POA: Diagnosis not present

## 2022-01-20 DIAGNOSIS — Z853 Personal history of malignant neoplasm of breast: Secondary | ICD-10-CM | POA: Diagnosis not present

## 2022-01-20 DIAGNOSIS — Z170 Estrogen receptor positive status [ER+]: Secondary | ICD-10-CM

## 2022-01-20 DIAGNOSIS — D485 Neoplasm of uncertain behavior of skin: Secondary | ICD-10-CM

## 2022-01-20 NOTE — Patient Instructions (Signed)

## 2022-01-20 NOTE — Progress Notes (Signed)
? ?  Follow-Up Visit ?  ?Subjective  ?Madison Clements is a 76 y.o. female who presents for the following: Skin Problem (Here for 2 new dark lesions on her breast. X months Patient has been though breast cancer/ radiation last year. Also has lesion on right leg that bleeds sometimes. X months. Ozzie Hoyle of non mole skin cancers. History of malignant melanoma. ). ? ? ?The following portions of the chart were reviewed this encounter and updated as appropriate:  Tobacco  Allergies  Meds  Problems  Med Hx  Surg Hx  Fam Hx   ?  ? ?Objective  ?Well appearing patient in no apparent distress; mood and affect are within normal limits. ? ?A full examination was performed including scalp, head, eyes, ears, nose, lips, neck, chest, axillae, abdomen, back, buttocks, bilateral upper extremities, bilateral lower extremities, hands, feet, fingers, toes, fingernails, and toenails. All findings within normal limits unless otherwise noted below. ? ?No signs of NMSC noted at the time of the visit.  ? ?central lower abdomen ?Dyspigmented scars clear ? ?Left Breast ?Bichromic dark nested macule.  ? ? ? ? ? ? ?Mid Back ?Bichromic dark nested macule.  ? ? ? ? ? ? ? ?Assessment & Plan  ?Personal history of malignant melanoma of skin ?central lower abdomen ? ?Yearly skin check ? ?Encounter for screening for malignant neoplasm of skin ? ?Yearly skin check ? ?Neoplasm of uncertain behavior of skin (2) ?Left Breast ? ?Skin / nail biopsy ?Type of biopsy: tangential   ?Informed consent: discussed and consent obtained   ?Timeout: patient name, date of birth, surgical site, and procedure verified   ?Procedure prep:  Patient was prepped and draped in usual sterile fashion (Non sterile) ?Prep type:  Chlorhexidine ?Anesthesia: the lesion was anesthetized in a standard fashion   ?Anesthetic:  1% lidocaine w/ epinephrine 1-100,000 local infiltration ?Instrument used: flexible razor blade   ?Outcome: patient tolerated procedure well   ?Post-procedure  details: wound care instructions given   ? ?Specimen 1 - Surgical pathology ?Differential Diagnosis: r/o atypia ? ?Check Margins: yes ? ?Mid Back ? ?Skin / nail biopsy ?Type of biopsy: tangential   ?Informed consent: discussed and consent obtained   ?Timeout: patient name, date of birth, surgical site, and procedure verified   ?Anesthesia: the lesion was anesthetized in a standard fashion   ?Anesthetic:  1% lidocaine w/ epinephrine 1-100,000 local infiltration ?Instrument used: flexible razor blade   ?Hemostasis achieved with: aluminum chloride and electrodesiccation   ?Outcome: patient tolerated procedure well   ?Post-procedure details: wound care instructions given   ? ?Specimen 2 - Surgical pathology ?Differential Diagnosis: r/o atypia ? ?Check Margins: yes ? ? ? ?I, Novi Calia, PA-C, have reviewed all documentation's for this visit.  The documentation on 01/20/22 for the exam, diagnosis, procedures and orders are all accurate and complete. ?

## 2022-01-24 ENCOUNTER — Telehealth: Payer: Self-pay

## 2022-01-24 NOTE — Telephone Encounter (Signed)
-----   Message from Warren Danes, [place] sent at 01/24/2022  1:28 PM EDT ----- ?Recheck in 6-9 months ?

## 2022-01-24 NOTE — Telephone Encounter (Signed)
Phone call to patient with her pathology results. Patient aware of results.  

## 2022-01-29 IMAGING — MG MM PLC BREAST LOC DEV EA ADD LESION INC MAMMO GUIDE*L*
7 series · 7 of 7 positions shown · non-contrast
Comparison: Previous exam(s).

CLINICAL DATA: Patient presents for bracketed seed localization of
the LEFT breast. Recent biopsy showed grade 2 invasive mammary
carcinoma in the 9 o'clock location of the LEFT breast, marked with
a ribbon clip and grade 2 invasive mammary carcinoma with mammary
carcinoma in situ marked by an X shaped, both sites within the
MEDIAL portion of the LEFT breast.

EXAM:
MAMMOGRAPHIC GUIDED RADIOACTIVE SEED LOCALIZATION OF THE LEFT BREAST
x2

[L ML (1 of 4)]
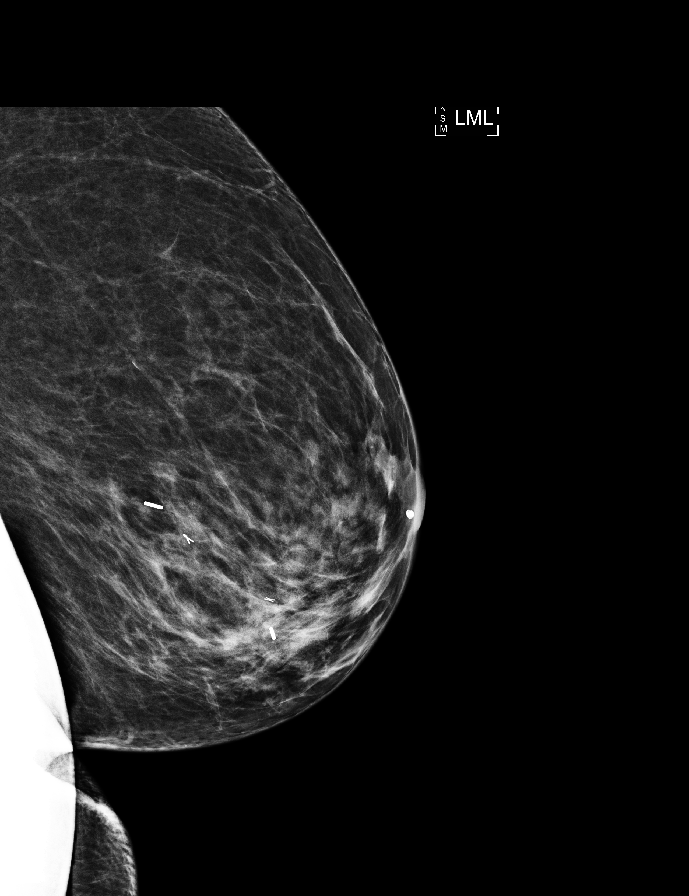

[L CC (1 of 3)]
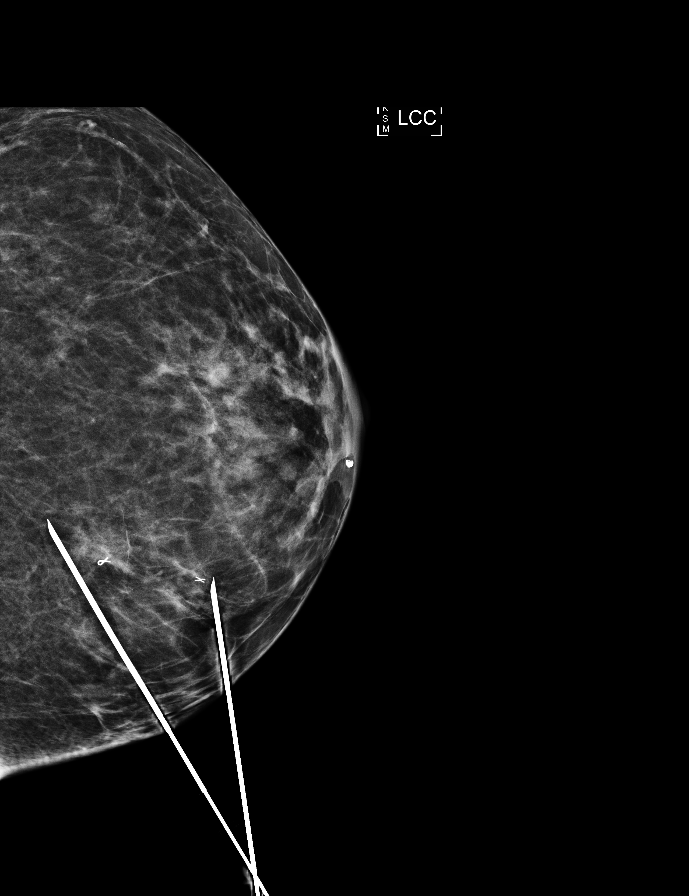

[L CC (2 of 3)]
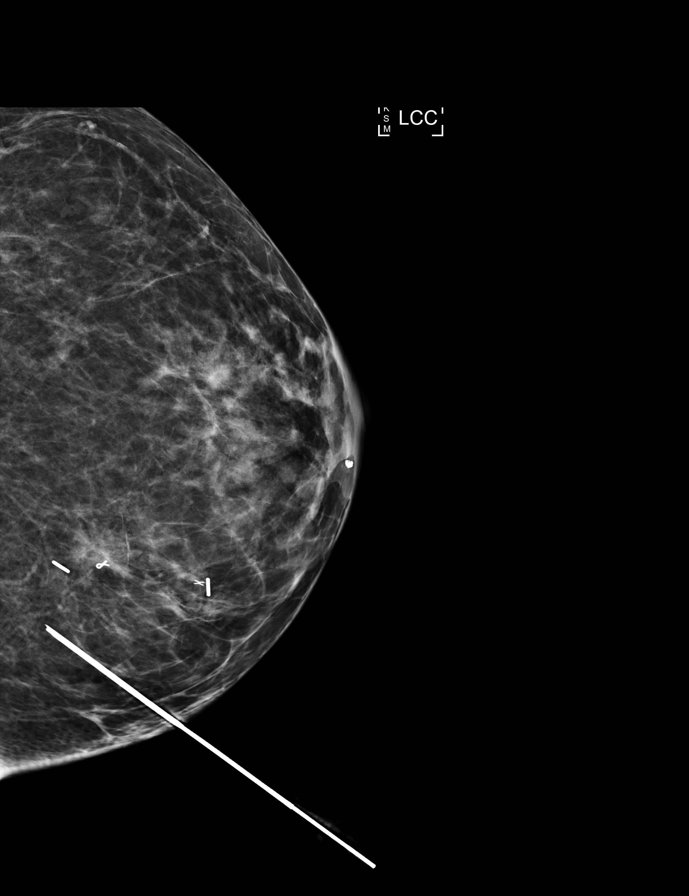

[L ML (2 of 4)]
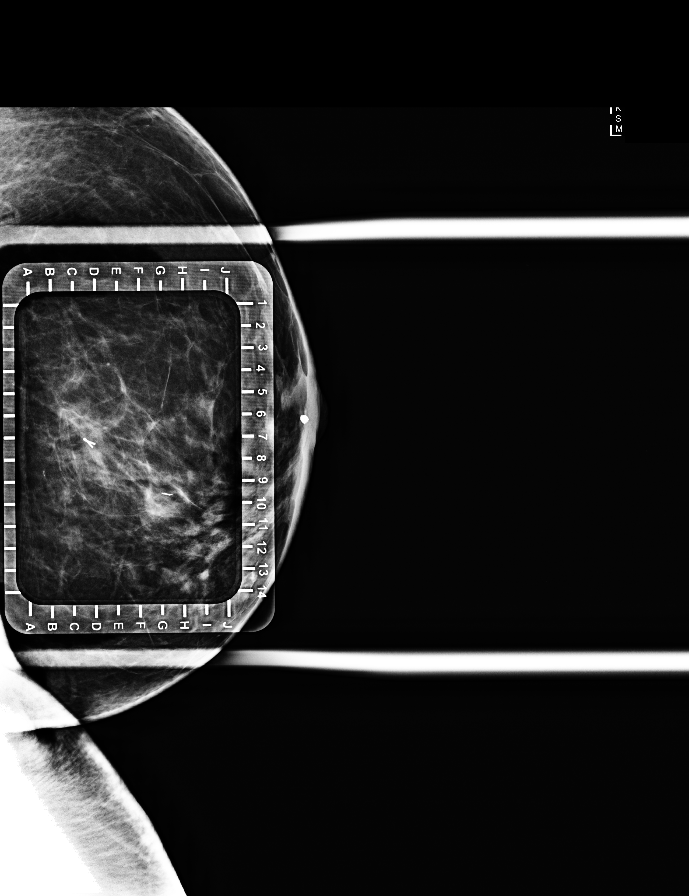

[L ML (3 of 4)]
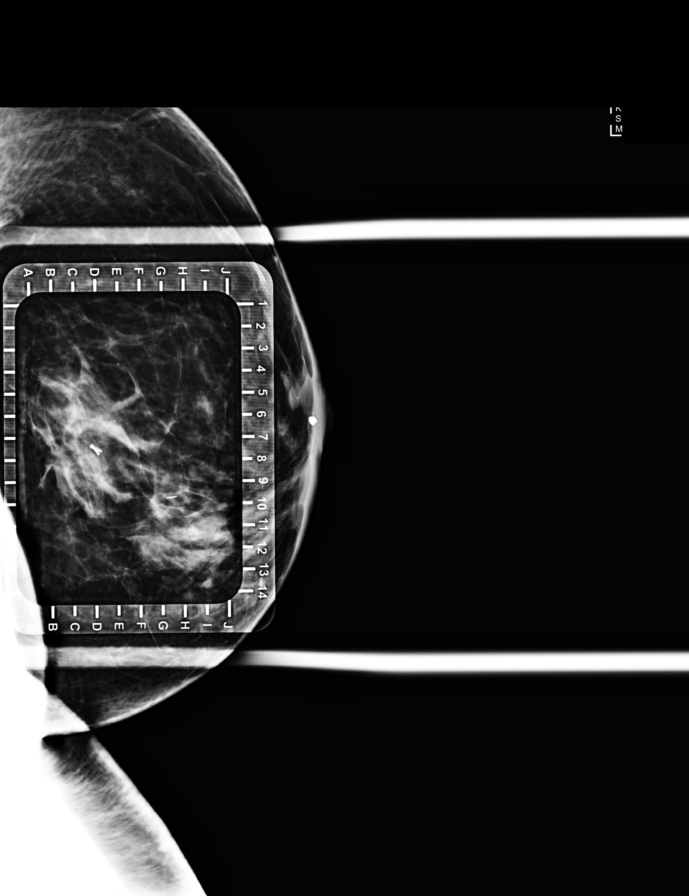

[L ML (4 of 4)]
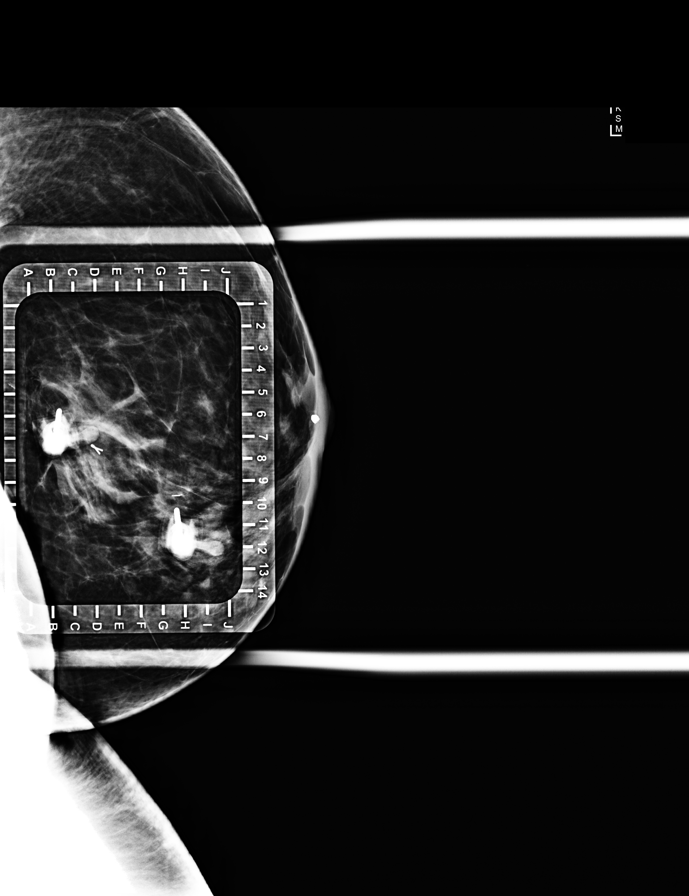

[L CC (3 of 3)]
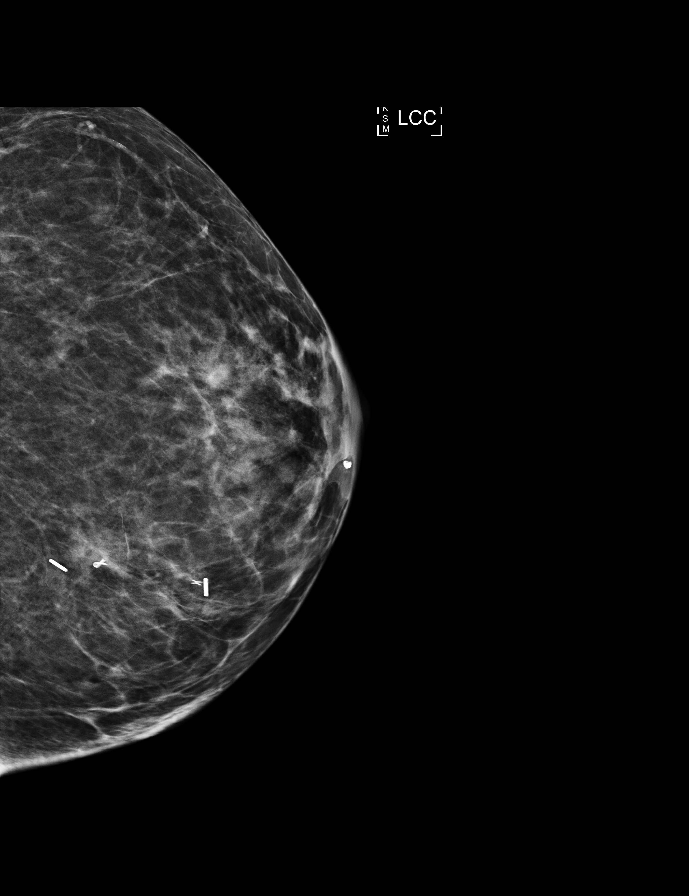

[7 of 7 positions shown; findings below may reference images not displayed]

FINDINGS: Patient presents for radioactive seed localization prior to
lumpectomy. I met with the patient and we discussed the procedure of
seed localization including benefits and alternatives. We discussed
the high likelihood of a successful procedure. We discussed the
risks of the procedure including infection, bleeding, tissue injury
and further surgery. We discussed the low dose of radioactivity
involved in the procedure. Informed, written consent was given.

The usual time-out protocol was performed immediately prior to the
procedure.

Zite6:

Using mammographic guidance, sterile technique, 1% lidocaine and an
Q-2JV radioactive seed, the tissue 1.5 centimeters posterior to the
ribbon shaped clip was localized using a MEDIAL approach. The
follow-up mammogram images confirm the seed in the expected location
and were marked for Dr. Xampos.

Follow-up survey of the patient confirms presence of the radioactive
seed.

Order number of [id number] seed:  [id number].

Total activity:  0.253 millicuries reference Date: 08/12/2021

Site 2:

Using mammographic guidance, sterile technique, 1% lidocaine and an
Q-2JV radioactive seed, the faint calcifications anterior and
inferior to the X shaped clip were localized using a MEDIAL
approach. The follow-up mammogram images confirm the seed in the
expected location and were marked for Dr. Xampos.

Follow-up survey of the patient confirms presence of the radioactive
seed.

Order number of [id number] seed:  [id number].

Total activity:  0.253 millicuries reference Date: 08/12/2021

The patient tolerated the procedure well and was released from the
[REDACTED]. She was given instructions regarding seed removal.
IMPRESSION: Bracketed radioactive seed localization LEFT breast. No apparent
complications.

## 2022-01-31 IMAGING — DX MM BREAST SURGICAL SPECIMEN
1 series · 2 of 2 positions shown · non-contrast
Comparison: Previous exam(s).

CLINICAL DATA: Assess surgical specimens following radioactive seed
localization of 2 adjacent left breast lesions.

EXAM:
SPECIMEN RADIOGRAPH OF THE LEFT BREAST: 2 SPECIMENS SUBMITTED.

[Series 2: specimen digital x-ray, derived · left · 0.07mm/px · 2 of 2 slices shown]
[im 1/2]
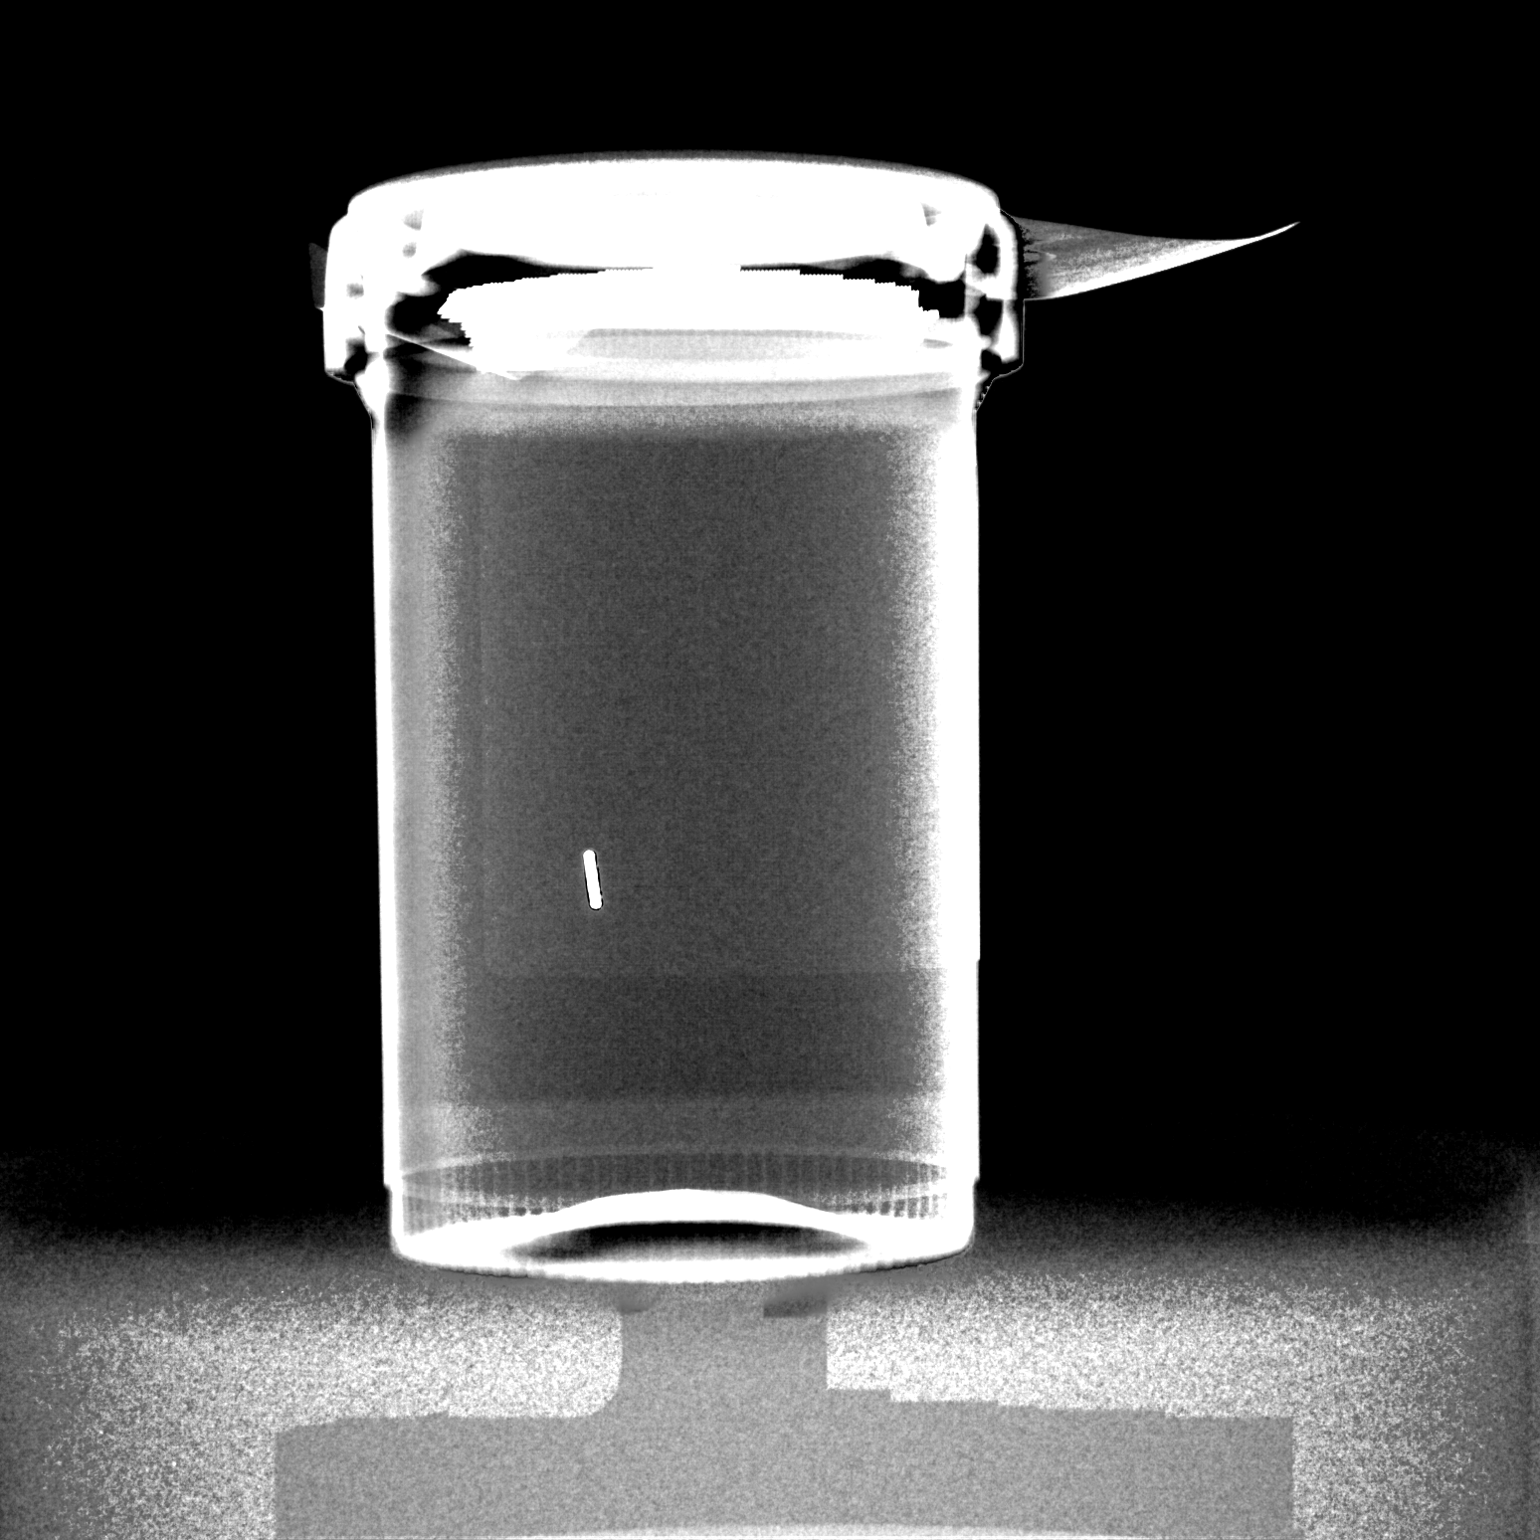
[im 2/2]
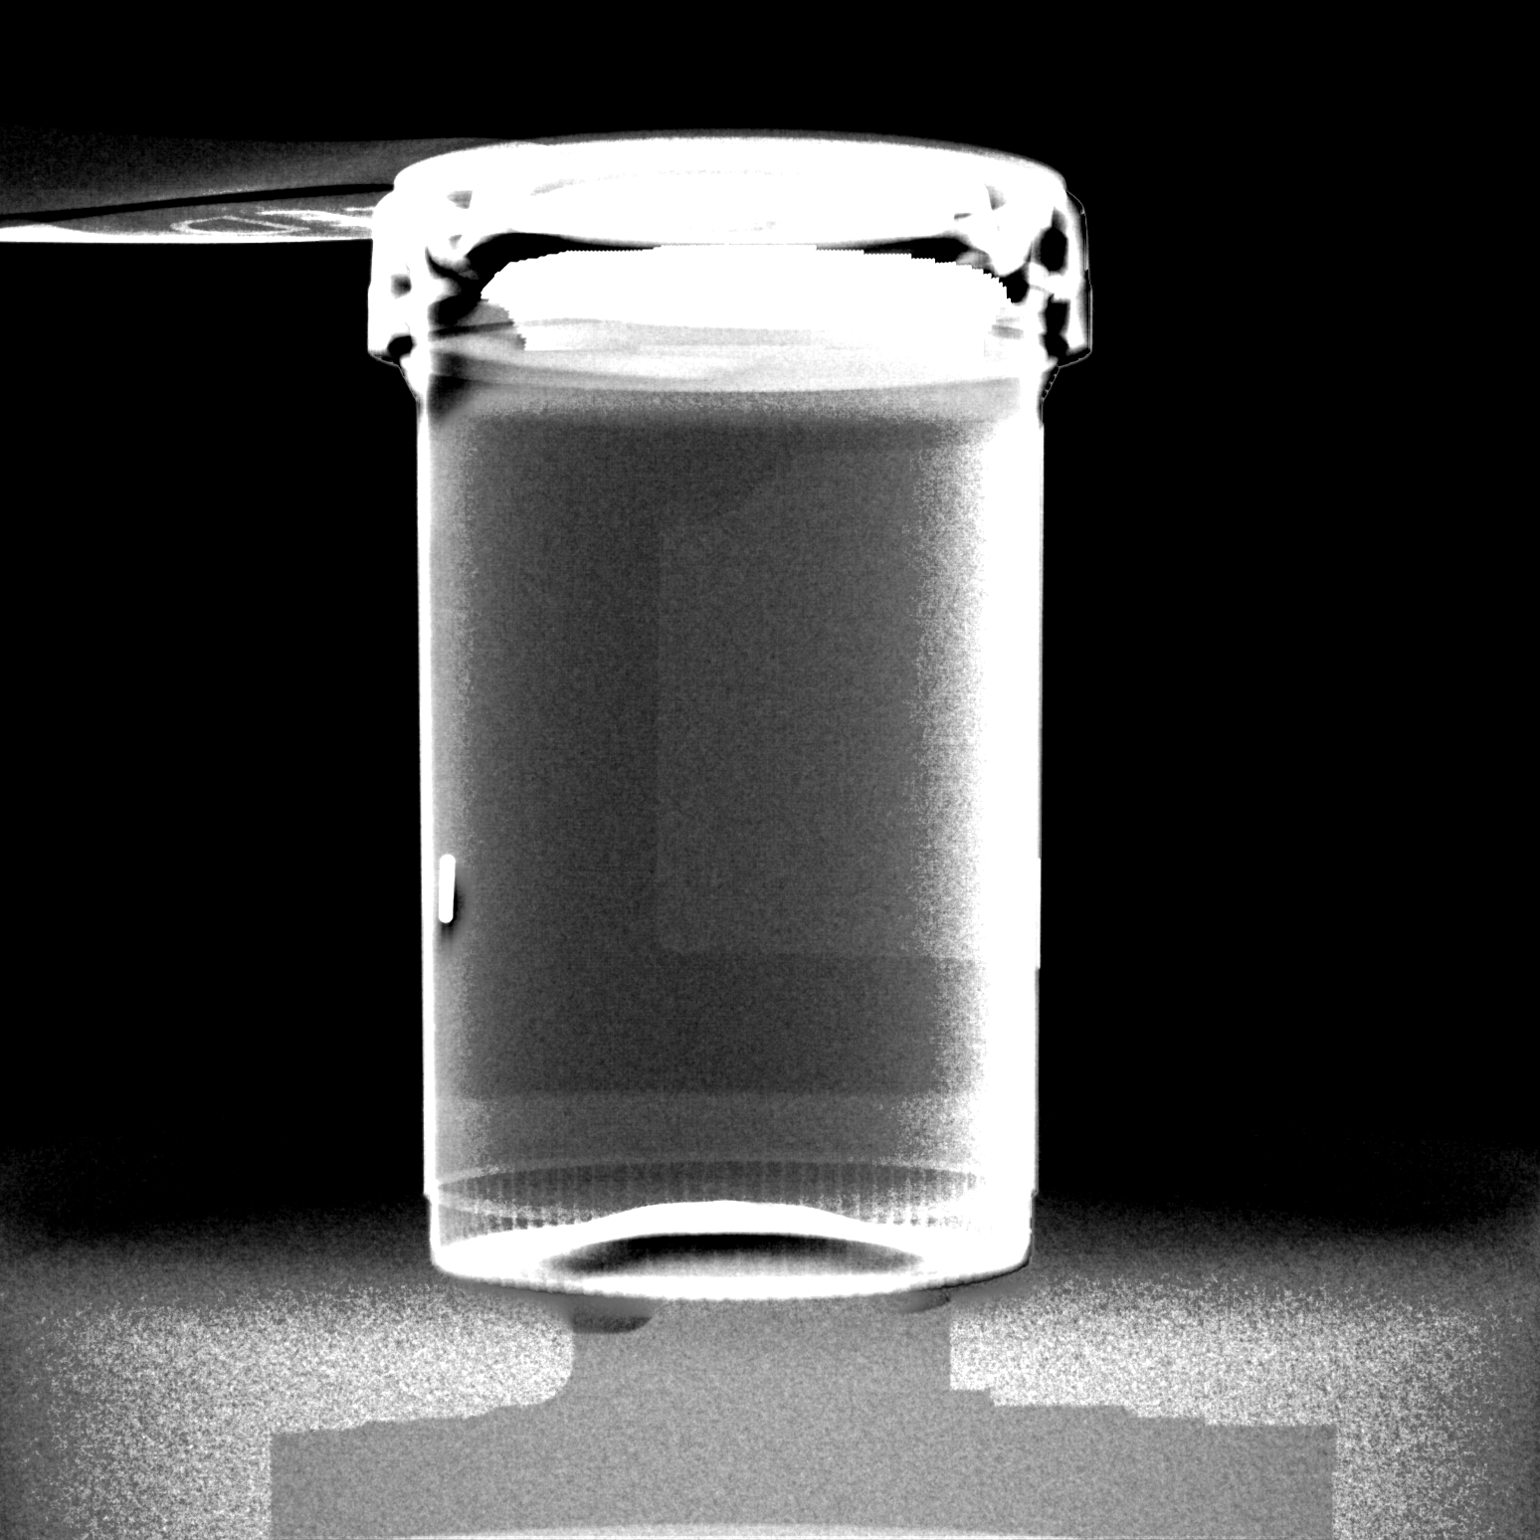

[2 of 2 positions shown; findings below may reference images not displayed]

FINDINGS: Status post excision of the left breast. In the tissue specimen, 1
radioactive seed and both post biopsy marker clips are well within
the specimen. A second image was sent of the other radioactive seed,
with a specimen container.
IMPRESSION: Specimen radiographs of the left breast.

## 2022-01-31 IMAGING — DX MM BREAST SURGICAL SPECIMEN
1 series · 2 of 2 positions shown · non-contrast
Comparison: Previous exam(s).

CLINICAL DATA: Assess surgical specimens following radioactive seed
localization of 2 adjacent left breast lesions.

EXAM:
SPECIMEN RADIOGRAPH OF THE LEFT BREAST: 2 SPECIMENS SUBMITTED.

[Series 1: specimen digital x-ray · left · 0.07mm/px · 2 of 2 slices shown]
[im 1/2]
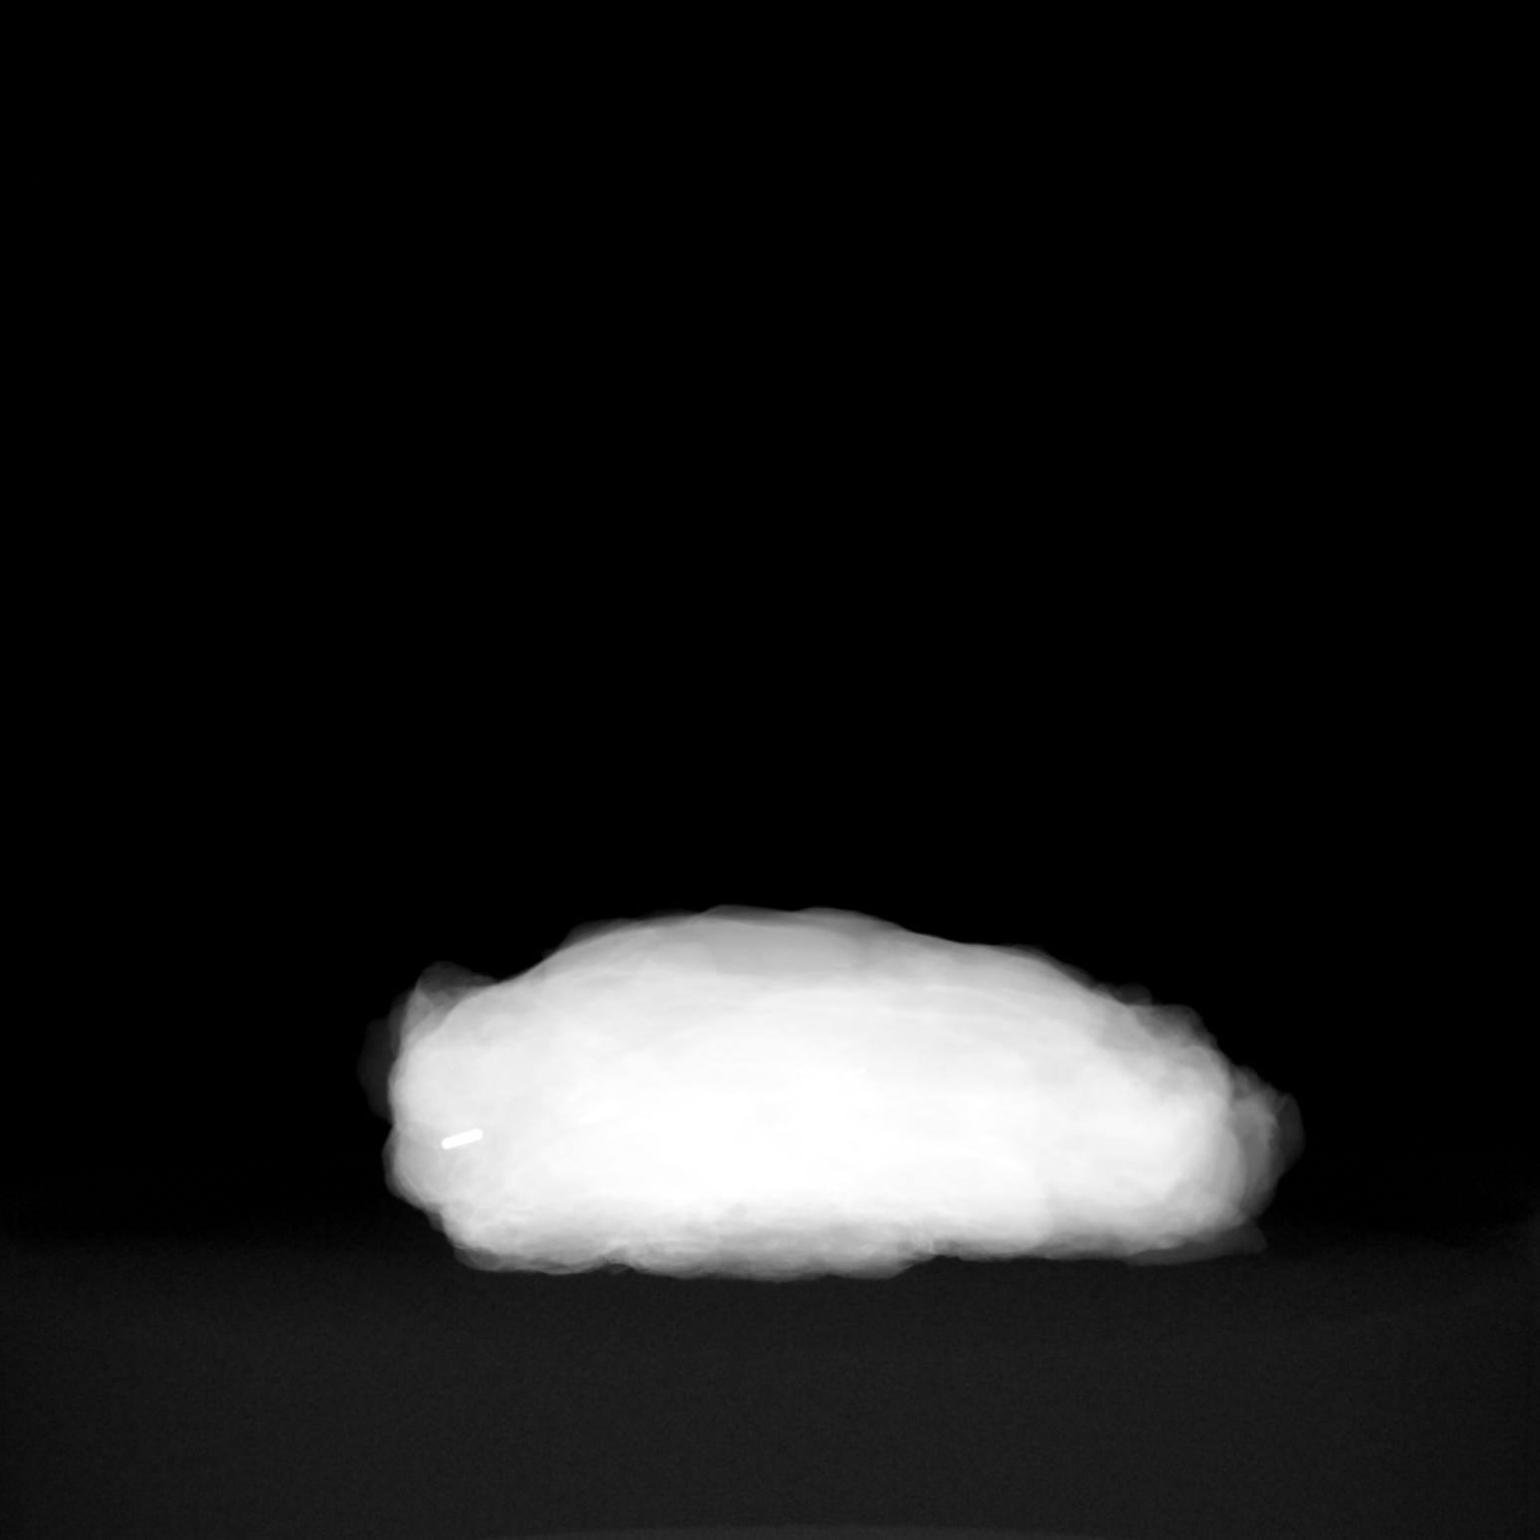
[im 2/2]
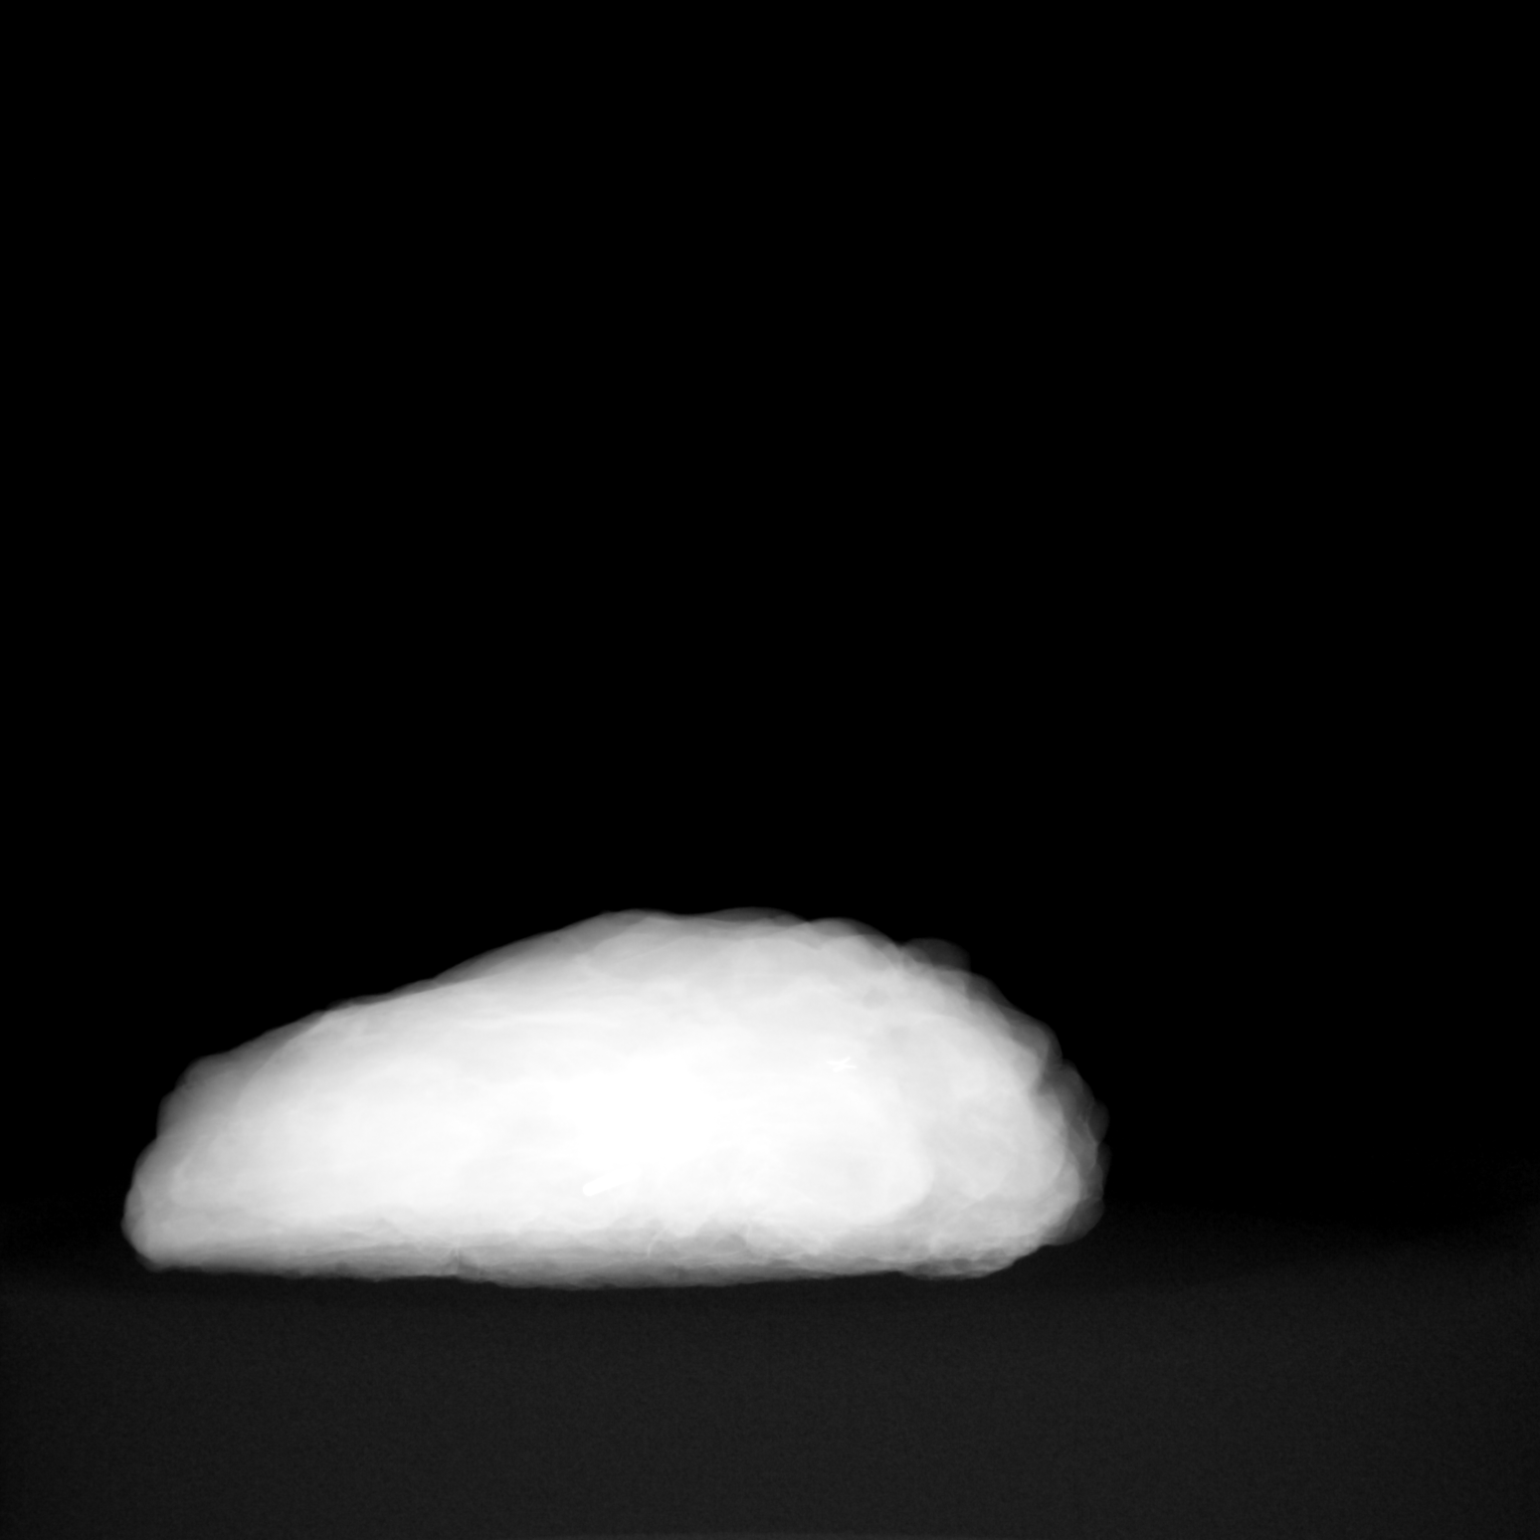

[2 of 2 positions shown; findings below may reference images not displayed]

FINDINGS: Status post excision of the left breast. In the tissue specimen, 1
radioactive seed and both post biopsy marker clips are well within
the specimen. A second image was sent of the other radioactive seed,
with a specimen container.
IMPRESSION: Specimen radiographs of the left breast.

## 2022-02-16 ENCOUNTER — Inpatient Hospital Stay: Payer: Medicare Other | Attending: Radiation Oncology | Admitting: Adult Health

## 2022-02-16 ENCOUNTER — Other Ambulatory Visit: Payer: Self-pay

## 2022-02-16 VITALS — BP 160/65 | HR 78 | Temp 97.3°F | Resp 18 | Ht 64.0 in | Wt 153.3 lb

## 2022-02-16 DIAGNOSIS — R232 Flushing: Secondary | ICD-10-CM | POA: Diagnosis not present

## 2022-02-16 DIAGNOSIS — Z800 Family history of malignant neoplasm of digestive organs: Secondary | ICD-10-CM | POA: Insufficient documentation

## 2022-02-16 DIAGNOSIS — I10 Essential (primary) hypertension: Secondary | ICD-10-CM | POA: Diagnosis not present

## 2022-02-16 DIAGNOSIS — Z90710 Acquired absence of both cervix and uterus: Secondary | ICD-10-CM | POA: Insufficient documentation

## 2022-02-16 DIAGNOSIS — C50812 Malignant neoplasm of overlapping sites of left female breast: Secondary | ICD-10-CM | POA: Insufficient documentation

## 2022-02-16 DIAGNOSIS — Z87891 Personal history of nicotine dependence: Secondary | ICD-10-CM | POA: Insufficient documentation

## 2022-02-16 DIAGNOSIS — Z170 Estrogen receptor positive status [ER+]: Secondary | ICD-10-CM | POA: Diagnosis not present

## 2022-02-16 DIAGNOSIS — Z79811 Long term (current) use of aromatase inhibitors: Secondary | ICD-10-CM | POA: Insufficient documentation

## 2022-02-16 DIAGNOSIS — I2510 Atherosclerotic heart disease of native coronary artery without angina pectoris: Secondary | ICD-10-CM | POA: Insufficient documentation

## 2022-02-16 DIAGNOSIS — Z8042 Family history of malignant neoplasm of prostate: Secondary | ICD-10-CM | POA: Insufficient documentation

## 2022-02-16 DIAGNOSIS — Z803 Family history of malignant neoplasm of breast: Secondary | ICD-10-CM | POA: Insufficient documentation

## 2022-02-16 DIAGNOSIS — K5900 Constipation, unspecified: Secondary | ICD-10-CM | POA: Insufficient documentation

## 2022-02-16 MED ORDER — ANASTROZOLE 1 MG PO TABS: 1.0000 mg | ORAL_TABLET | Freq: Every day | ORAL | 0 refills | Status: DC

## 2022-02-16 NOTE — Progress Notes (Signed)
SURVIVORSHIP VISIT:   BRIEF ONCOLOGIC HISTORY:  Oncology History Overview Note   Cancer Staging  Malignant neoplasm of overlapping sites of left breast in female, estrogen receptor positive ([place]) Staging form: Breast, AJCC 8th Edition - Clinical stage from 08/11/2021: Stage IA (cT1c, cN0, cM0, G2, ER+, PR+, HER2-) - Signed by Truitt Merle, MD on 08/18/2021     Malignant neoplasm of overlapping sites of left breast in female, estrogen receptor positive ([place])  07/27/2021 Mammogram   EXAM: DIGITAL DIAGNOSTIC UNILATERAL LEFT MAMMOGRAM WITH TOMOSYNTHESIS AND CAD; ULTRASOUND LEFT BREAST LIMITED  IMPRESSION: 1. There is a suspicious mass in the left breast at 9 o'clock measuring 1.8 cm.   2. There is a 1.0 cm group of suspicious linear calcifications anterior to the suspicious mass in the left breast, as well as a faint 2 mm group which lie 1 cm lateral to the linear calcifications.   3.  No evidence of left axillary lymphadenopathy   08/11/2021 Cancer Staging   Staging form: Breast, AJCC 8th Edition - Clinical stage from 08/11/2021: Stage IA (cT1c, cN0, cM0, G2, ER+, PR+, HER2-) - Signed by Truitt Merle, MD on 08/18/2021 Stage prefix: Initial diagnosis Histologic grading system: 3 grade system    08/11/2021 Initial Biopsy   Diagnosis 1. Breast, left, needle core biopsy, 9 o'clock, ribbon clip - INVASIVE MAMMARY CARCINOMA - SEE COMMENT 2. Breast, left, needle core biopsy, lower inner quadrant, x clip - INVASIVE MAMMARY CARCINOMA - MAMMARY CARCINOMA IN-SITU - CALCIFICATIONS - SEE COMMENT Microscopic Comment 1. The biopsy material shows an infiltrative proliferation of cells with arranged linearly and in small clusters. Based on the biopsy, the carcinoma appears Nottingham grade 2 of 3 and measures 1.2 cm in greatest linear extent. 2. Based on the biopsy, the carcinoma appears Nottingham grade 2 of 3 and measures 0.2 cm in greatest linear extent.  1. PROGNOSTIC  INDICATORS Results: The tumor cells are NEGATIVE for Her2 (1+). Estrogen Receptor: 100%, POSITIVE, STRONG STAINING INTENSITY Progesterone Receptor: 60%, POSITIVE, STRONG STAINING INTENSITY Proliferation Marker Ki67: 10%  2. PROGNOSTIC INDICATORS Results: The tumor cells are NEGATIVE for Her2 (1+). Estrogen Receptor: 100%, POSITIVE, STRONG STAINING INTENSITY Progesterone Receptor: 60%, POSITIVE, STRONG STAINING INTENSITY Proliferation Marker Ki67: 15%   08/13/2021 Mammogram   EXAM: DIGITAL DIAGNOSTIC UNILATERAL RIGHT MAMMOGRAM WITH TOMOSYNTHESIS AND CAD  IMPRESSION: No mammographic evidence for malignancy.   08/16/2021 Initial Diagnosis   Malignant neoplasm of overlapping sites of left breast in female, estrogen receptor positive ([place])    08/25/2021 Imaging   EXAM: BILATERAL BREAST MRI WITH AND WITHOUT CONTRAST  IMPRESSION: 1. Biopsy-proven invasive ductal carcinoma measuring approximately 2.8 x 1.7 x 1.5 cm in the inner breast at middle depth, associated with the ribbon shaped tissue marking clip placed at the time of core needle biopsy. Enhancement extends approximately 1.4 cm posterior to the clip. 2. Approximate 1.8 cm post biopsy hematoma with associated rim enhancement at the site of the biopsy-proven invasive ductal carcinoma and DCIS in the LOWER INNER QUADRANT at anterior depth associated with the X shaped tissue marking clip. 3. In combination, the overall enhancement spans approximately 4 cm. 4. No MRI evidence of malignancy involving the RIGHT breast. 5. No pathologic lymphadenopathy.   08/27/2021 Genetic Testing   egative hereditary cancer genetic testing: no pathogenic variants detected in [facility] BRCAPlus Panel and CancerNext-Expanded +RNAinsight Panel.  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  The report dates are 08/27/2021 and 08/30/2021.   The BRCAplus panel offered by  [facility] and includes sequencing and  deletion/duplication analysis for the following 8 genes: ATM, BRCA1, BRCA2, CDH1, CHEK2, PALB2, PTEN, and TP53.  The CancerNext-Expanded gene panel offered by [facility] and includes sequencing, rearrangement, and RNA analysis for the following 77 genes: AIP, ALK, APC, ATM, AXIN2, BAP1, BARD1, BLM, BMPR1A, BRCA1, BRCA2, BRIP1, CDC73, CDH1, CDK4, CDKN1B, CDKN2A, CHEK2, CTNNA1, DICER1, FANCC, FH, FLCN, GALNT12, KIF1B, LZTR1, MAX, MEN1, MET, MLH1, MSH2, MSH3, MSH6, MUTYH, NBN, NF1, NF2, NTHL1, PALB2, PHOX2B, PMS2, POT1, PRKAR1A, PTCH1, PTEN, RAD51C, RAD51D, RB1, RECQL, RET, SDHA, SDHAF2, SDHB, SDHC, SDHD, SMAD4, SMARCA4, SMARCB1, SMARCE1, STK11, SUFU, TMEM127, TP53, TSC1, TSC2, VHL and XRCC2 (sequencing and deletion/duplication); EGFR, EGLN1, HOXB13, KIT, MITF, PDGFRA, POLD1, and POLE (sequencing only); EPCAM and GREM1 (deletion/duplication only).    09/09/2021 Definitive Surgery   FINAL MICROSCOPIC DIAGNOSIS:   A. BREAST, LEFT, LUMPECTOMY:  -  Invasive ductal carcinoma, Nottingham grade 2 of 3, 3.5 cm  -  Ductal carcinoma in-situ, intermediate grade  -  Margins uninvolved by carcinoma (<0.1 cm; anterior)  -  Previous biopsy site changes present    09/09/2021 Oncotype testing   Oncotype DX was obtained on the final surgical sample and the recurrence score of 21 predicts a risk of recurrence outside the breast over the next 9 years of 7%, if the patient's only systemic therapy is an antiestrogen for 5 years.  It also predicts no benefit from chemotherapy.   10/20/2021 - 11/16/2021 Radiation Therapy   Site Technique Total Dose (Gy) Dose per Fx (Gy) Completed Fx Beam Energies  Breast, Left: Breast_L_axilla 3D 40.05/40.05 2.67 15/15 10X  Breast, Left: Breast_L_Bst 3D 10/10 2 5/5 6X, 10X     12/06/2021 -  Anti-estrogen oral therapy   Tamoxifen x 5 years     INTERVAL HISTORY:  Madison Clements to review her survivorship care plan detailing her treatment course for breast cancer, as well as monitoring  long-term side effects of that treatment, education regarding health maintenance, screening, and overall wellness and health promotion.     Overall, Madison Clements reports feeling quite well.  She had significant difficulty with taking tamoxifen.  She started this initially in the beginning of April however developed severe hot flashes constipation and fatigue.  She stopped tamoxifen on April 16 and when she resumed the therapy the same symptoms again occurred.  She wants to know if there is an alternative medication that she can take as her quality of life is too negatively impacted by the side effects of the tamoxifen.  Otherwise she says she is doing okay and is without any questions or concerns today  REVIEW OF SYSTEMS:  Review of Systems  Constitutional:  Negative for appetite change, chills, fatigue, fever and unexpected weight change.  HENT:   Negative for hearing loss, lump/mass and trouble swallowing.   Eyes:  Negative for eye problems and icterus.  Respiratory:  Negative for chest tightness, cough and shortness of breath.   Cardiovascular:  Negative for chest pain, leg swelling and palpitations.  Gastrointestinal:  Negative for abdominal distention, abdominal pain, constipation, diarrhea, nausea and vomiting.  Endocrine: Negative for hot flashes.  Genitourinary:  Negative for difficulty urinating.   Musculoskeletal:  Negative for arthralgias.  Skin:  Negative for itching and rash.  Neurological:  Negative for dizziness, extremity weakness, headaches and numbness.  Hematological:  Negative for adenopathy. Does not bruise/bleed easily.  Psychiatric/Behavioral:  Negative for depression. The patient is not nervous/anxious.   Breast: Denies any new nodularity, masses, tenderness, nipple  changes, or nipple discharge.      ONCOLOGY TREATMENT TEAM:  1. Surgeon:  Dr. Burr [facility]ico at [facility] 2. Medical Oncologist: Dr. Georgette Dover  3. Radiation Oncologist: Dr. Isidore Moos    PAST  MEDICAL/SURGICAL HISTORY:  Past Medical History:  Diagnosis Date   Allergy 1990   Morphine following surgery   Arthritis    generalized   Atypical mole 02/24/2015   RGHT LATERAL THIGH MODERATE   Atypical mole 01/20/2022   Left Breast (mild)   Atypical mole 01/20/2022   Mid Back (mild)   Atypical nevi 02/24/2015   RIGHT NECK MILD   Atypical nevi 08/25/2015   LEFT UPPER ARM MILD   Atypical nevi 08/25/2015   LEFT LATERAL FOREARM MILD/FREE   Atypical nevi 02/23/2016   LEFT THIGH MODERATE/FREE   Atypical nevi 02/23/2016   LEFT MEDIAL LEG MODERATE/FREE   Atypical nevi 02/23/2016   LEFT UPPER BACK MILD /FREE   Atypical nevi 08/23/2016   RIGHT ANT PROX THIGH MILD /FREE   Atypical nevi 07/25/2018   MID LOWER BACK MODERATE   Atypical nevi 01/22/2019   LEFT MID BACK MILD /FREE   Atypical nevi 01/22/2019   LEFT NECK MILD   Atypical nevi 08/06/2019   LEFT INNER BREAST MODERATE W/S   Atypical nevi 08/06/2019   LEFT OUTER SIDE MILD/FREE   Blood transfusion without reported diagnosis    hx of   Breast cancer ([place]) 2022   CAD (coronary artery disease), native coronary artery 02/07/2019   Minimal CAD at the time of cath in 2016 in [place]   Family history of breast cancer 08/19/2021   Family history of pancreatic cancer 08/19/2021   Fatty liver 05/21/2020   Gastric polyps 10/02/2018   EGD 03/2015; benign   GERD (gastroesophageal reflux disease) 10/02/2018   on meds   History of melanoma 10/02/2018   Abdomen 2012; 2013; s/p local excisions.    Hyperlipidemia    on meds   Hypertension    on meds   Melanoma ([place]) 2012   MM- central lower abdomen- (ECX) [facility]   Osteopenia    Polyp of colon, adenomatous 10/02/2018   Colonoscopy 03/2015; recheck in 2021   PONV (postoperative nausea and vomiting)    Pulmonary hypertension, primary ([place]) 10/02/2018   SCCA (squamous cell carcinoma) of skin 09/18/2017   RIGHT ANT. DISTAL LOWER LEG TREATED BY DR.  Delton Coombes   Sleep apnea    Sleep apnea 05/26/2020   uses CPAP   Past Surgical History:  Procedure Laterality Date   [place]   BREAST LUMPECTOMY WITH RADIOACTIVE SEED LOCALIZATION Left 09/09/2021   Procedure: LEFT BREAST LUMPECTOMY WITH RADIOACTIVE SEED LOCALIZATION X2;  Surgeon: Donnie Mesa, MD;  Location: [facility];  Service: General;  Laterality: Left;   COLONOSCOPY  2016   in -hx of polyps   EYE SURGERY  2013 torn retina laser   MENISCUS REPAIR Right 2001   right knee   thumb surgery Left    TONSILLECTOMY AND ADENOIDECTOMY     tuabl ligation     TUBAL LIGATION  1975   WISDOM TOOTH EXTRACTION       ALLERGIES:  Allergies  Allergen Reactions   Morphine Rash and Nausea And Vomiting   Morphine And Related Hives, Dermatitis and Rash     CURRENT MEDICATIONS:  Outpatient Encounter Medications as of 02/16/2022  Medication Sig   celecoxib (CELEBREX)  100 MG capsule TAKE 1 CAPSULE (100 MG TOTAL) BY MOUTH DAILY.   diltiazem (TIAZAC) 180 MG 24 hr capsule Take 1 capsule (180 mg total) by mouth daily.   folic acid (FOLVITE) 400 MCG tablet Take 400 mcg by mouth daily.   furosemide (LASIX) 40 MG tablet Take 1 tablet (40 mg total) daily. Take an extra 1/2 tablet (20 mg total) as needed. (Patient taking differently: 20 mg daily. Take 1 tablet (40 mg total) daily. Take an extra 1/2 tablet (20 mg total) as needed. Patient states that she is taking 20 mg currently)   lisinopril (ZESTRIL) 10 MG tablet Take 1 tablet (10 mg total) by mouth daily.   omeprazole (PRILOSEC) 20 MG capsule TAKE 1 CAPSULE (20 MG TOTAL) BY MOUTH DAILY.   predniSONE (DELTASONE) 10 MG tablet Take 4 tabs qd x 2 days, 3 qd x 2 days, 2 qd x 2d, 1qd x 3 days   Probiotic Product (ADVANCED PROBIOTIC PO) Take 1 capsule by mouth daily.   rosuvastatin (CRESTOR) 40 MG tablet Take 1 tablet (40 mg total) by mouth at bedtime.   Specialty Vitamins  Products (ONE-A-DAY BONE STRENGTH PO) Take 1 tablet by mouth daily.    tamoxifen (NOLVADEX) 20 MG tablet Take 1 tablet (20 mg total) by mouth daily.   No facility-administered encounter medications on file as of 02/16/2022.     ONCOLOGIC FAMILY HISTORY:  Family History  Problem Relation Age of Onset   Arthritis Mother    High blood pressure Mother    Hyperlipidemia Mother    Hypertension Mother    Stroke Mother    Varicose Veins Mother    Heart attack Father    Heart disease Father    High blood pressure Father    Hypertension Father    Arthritis Sister    Diabetes Sister    High Cholesterol Sister    High blood pressure Sister    Breast cancer Sister 78   Varicose Veins Sister    Pancreatic cancer Maternal Aunt        dx early 64s   Heart disease Maternal Uncle    Heart disease Maternal Uncle    Stroke Paternal Aunt    Early death Maternal Grandmother        Stroke at 73   High blood pressure Maternal Grandmother    Hypertension Maternal Grandmother    Stroke Maternal Grandmother    Asthma Maternal Grandfather    Kidney disease Maternal Grandfather    Arthritis Paternal Grandmother    Heart disease Paternal Grandmother    High blood pressure Paternal Grandmother    Hypertension Paternal Grandmother    Kidney disease Paternal Grandfather    Asthma Paternal Grandfather    COPD Paternal Grandfather    Prostate cancer Cousin        paternal cousins x2; dx after 40   Colon polyps Neg Hx    Colon cancer Neg Hx    Esophageal cancer Neg Hx    Rectal cancer Neg Hx    Stomach cancer Neg Hx      SOCIAL HISTORY:  Social History   Socioeconomic History   Marital status: Widowed    Spouse name: Not on file   Number of children: 1   Years of education: Not on file   Highest education level: Not on file  Occupational History   Occupation: Retired [employer]  Tobacco Use   Smoking status: Former    Packs/day: 0.25    Years: 10.00  Pack years: 2.50    Types: Cigarettes     Quit date: 09/12/1996    Years since quitting: 25.4   Smokeless tobacco: Never  Vaping Use   Vaping Use: Never used  Substance and Sexual Activity   Alcohol use: Yes    Alcohol/week: 10.0 standard drinks    Types: 5 Glasses of wine, 5 Shots of liquor per week    Comment: occassional    Drug use: Never   Sexual activity: Not Currently    Birth control/protection: Abstinence, Post-menopausal    Comment: hysterectomy  Other Topics Concern   Not on file  Social History Narrative   Widowed 2018 after 69 years of marriage   One daughter, 2 grandchildren. Lives in [place]   Sister lives in same complex in [place]    Previously lived in [place], [place]   Social Determinants of Health   Financial Resource Strain: Low Risk    Difficulty of Paying Living Expenses: Not hard at all  Food Insecurity: No Food Insecurity   Worried About Charity fundraiser in the Last Year: Never true   Arboriculturist in the Last Year: Never true  Transportation Needs: No Transportation Needs   Lack of Transportation (Medical): No   Lack of Transportation (Non-Medical): No  Physical Activity: Insufficiently Active   Days of Exercise per Week: 5 days   Minutes of Exercise per Session: 20 min  Stress: No Stress Concern Present   Feeling of Stress : Not at all  Social Connections: Moderately Integrated   Frequency of Communication with Friends and Family: More than three times a week   Frequency of Social Gatherings with Friends and Family: More than three times a week   Attends Religious Services: 1 to 4 times per year   Active Member of [employer] or Organizations: Yes   Attends Archivist Meetings: 1 to 4 times per year   Marital Status: Widowed  Human resources officer Violence: Not At Risk   Fear of Current or Ex-Partner: No   Emotionally Abused: No   Physically Abused: No   Sexually Abused: No     OBSERVATIONS/OBJECTIVE:  BP (!) 160/65 (BP Location: Left Arm, Patient Position: Sitting)    Pulse 78   Temp (!) 97.3 F (36.3 C) (Temporal)   Resp 18   Ht $R'5\' 4"'jN$  (1.626 m)   Wt 153 lb 4.8 oz (69.5 kg)   SpO2 98%   BMI 26.31 kg/m  GENERAL: Patient is a well appearing female in no acute distress HEENT:  Sclerae anicteric.  Oropharynx clear and moist. No ulcerations or evidence of oropharyngeal candidiasis. Neck is supple.  NODES:  No cervical, supraclavicular, or axillary lymphadenopathy palpated.  BREAST EXAM: Right breast is benign, left breast status postlumpectomy and radiation no sign of local recurrence. LUNGS:  Clear to auscultation bilaterally.  No wheezes or rhonchi. HEART:  Regular rate and rhythm. No murmur appreciated. ABDOMEN:  Soft, nontender.  Positive, normoactive bowel sounds. No organomegaly palpated. MSK:  No focal spinal tenderness to palpation. Full range of motion bilaterally in the upper extremities. EXTREMITIES:  No peripheral edema.   SKIN:  Clear with no obvious rashes or skin changes. No nail dyscrasia. NEURO:  Nonfocal. Well oriented.  Appropriate affect.   LABORATORY DATA:  None for this visit.  DIAGNOSTIC IMAGING:  None for this visit.      ASSESSMENT AND PLAN:  Ms.. Clements is a pleasant 76 y.o. female with Stage 1A left breast invasive ductal  carcinoma, ER+/PR+/HER2-, diagnosed in November 2022, treated with lumpectomy, adjuvant radiation therapy, and antiestrogen therapy initially with tamoxifen, however now trying anastrozole..  She presents to the Survivorship Clinic for our initial meeting and routine follow-up post-completion of treatment for breast cancer.    1. Stage 1A left breast cancer:  Ms. Harkless is continuing to recover from definitive treatment for breast cancer. She will follow-up with her medical oncologist, Dr. Morey Hummingbird in September 2023 with history and physical exam per surveillance protocol.  Sunday Spillers could not tolerate tamoxifen.  We discussed that she will try anastrozole and reviewed risks and benefits in detail.  We reviewed  her risk of recurrence without antiestrogen therapy at all.  Sunday Spillers notes that at her age quality of life is most important and she would like to try the anastrozole however is understandably unwilling to sacrifice her life's quality for potential risk reduction of 7%.   I let her know that should the anastrozole not work to stop taking it and regardless follow-up with Dr. Burr Medico in September as scheduled.  Her mammogram is due 01/2023; orders placed today. Today, a comprehensive survivorship care plan and treatment summary was reviewed with the patient today detailing her breast cancer diagnosis, treatment course, potential late/long-term effects of treatment, appropriate follow-up care with recommendations for the future, and patient education resources.  A copy of this summary, along with a letter will be sent to the patient's primary care provider via mail/fax/In Basket message after today's visit.    2. Bone health:  Given Ms. Minton's age/history of breast cancer and her current treatment regimen including anti-estrogen therapy with anastrozole, she is at risk for bone demineralization.  Sunday Spillers undergoes bone density testing regularly with her primary care and I recommended that she continue to do so.  She was given education on specific activities to promote bone health.  3. Cancer screening:  Due to Ms. Pankratz's history and her age, she should receive screening for skin cancers.  The information and recommendations are listed on the patient's comprehensive care plan/treatment summary and were reviewed in detail with the patient.    4. Health maintenance and wellness promotion: Ms. Whittle was encouraged to consume 5-7 servings of fruits and vegetables per day. We reviewed the "Nutrition Rainbow" handout.  She was also encouraged to engage in moderate to vigorous exercise for 30 minutes per day most days of the week. We discussed the [employer] fitness program, which is designed for cancer survivors to  help them become more physically fit after cancer treatments.  She was instructed to limit her alcohol consumption and continue to abstain from tobacco use.     5. Support services/counseling: It is not uncommon for this period of the patient's cancer care trajectory to be one of many emotions and stressors.  She was given information regarding our available services and encouraged to contact me with any questions or for help enrolling in any of our support group/programs.    Follow up instructions:    -Return to cancer center in September 2023 for follow-up with Dr. Burr Medico -Mammogram due in May 2024 -Follow up with surgery March 2024 -She is welcome to return back to the Survivorship Clinic at any time; no additional follow-up needed at this time.  -Consider referral back to survivorship as a long-term survivor for continued surveillance  The patient was provided an opportunity to ask questions and all were answered. The patient agreed with the plan and demonstrated an understanding of the instructions.   Total  encounter time:30 minutes*in face-to-face visit time, chart review, lab review, care coordination, order entry, and documentation of the encounter time.   Wilber Bihari, NP 02/16/22 10:06 AM Medical Oncology and Hematology [facility] [place], [place] [address] Tel. [phone]    Fax. [phone]  *Total Encounter Time as defined by the Centers for Medicare and Medicaid Services includes, in addition to the face-to-face time of a patient visit (documented in the note above) non-face-to-face time: obtaining and reviewing outside history, ordering and reviewing medications, tests or procedures, care coordination (communications with other health care professionals or caregivers) and documentation in the medical record.

## 2022-02-17 ENCOUNTER — Encounter: Payer: Self-pay | Admitting: Adult Health

## 2022-02-17 NOTE — Addendum Note (Signed)
Addended by: Rea [employer] D on: 02/17/2022 08:19 AM   Modules accepted: Orders

## 2022-02-23 ENCOUNTER — Other Ambulatory Visit: Payer: Self-pay | Admitting: Cardiology

## 2022-02-23 MED ORDER — DILTIAZEM HCL ER BEADS 180 MG PO CP24: 180.0000 mg | ORAL_CAPSULE | Freq: Every day | ORAL | 0 refills | Status: DC

## 2022-02-28 ENCOUNTER — Telehealth: Payer: Medicare Other

## 2022-03-09 ENCOUNTER — Ambulatory Visit: Payer: Medicare Other | Admitting: Physician Assistant

## 2022-03-16 ENCOUNTER — Ambulatory Visit: Payer: Medicare Other | Admitting: Physician Assistant

## 2022-03-28 ENCOUNTER — Encounter: Payer: Self-pay | Admitting: Family Medicine

## 2022-04-02 ENCOUNTER — Encounter: Payer: Self-pay | Admitting: Cardiology

## 2022-04-02 DIAGNOSIS — I270 Primary pulmonary hypertension: Secondary | ICD-10-CM

## 2022-04-05 ENCOUNTER — Ambulatory Visit (HOSPITAL_COMMUNITY): Payer: Medicare Other | Attending: Internal Medicine

## 2022-04-05 DIAGNOSIS — I270 Primary pulmonary hypertension: Secondary | ICD-10-CM | POA: Insufficient documentation

## 2022-04-05 LAB — ECHOCARDIOGRAM LIMITED
Area-P 1/2: 3.1 cm2
S' Lateral: 2.4 cm

## 2022-04-09 ENCOUNTER — Other Ambulatory Visit: Payer: Self-pay | Admitting: Cardiology

## 2022-04-11 ENCOUNTER — Encounter: Payer: Self-pay | Admitting: Family Medicine

## 2022-04-11 DIAGNOSIS — Z170 Estrogen receptor positive status [ER+]: Secondary | ICD-10-CM | POA: Diagnosis not present

## 2022-04-11 DIAGNOSIS — C50212 Malignant neoplasm of upper-inner quadrant of left female breast: Secondary | ICD-10-CM | POA: Diagnosis not present

## 2022-04-11 NOTE — Telephone Encounter (Signed)
furosemide (LASIX) 40 MG tablet Medication Date: 05/11/2021 Department: [address] Office Ordering/Authorizing: Sueanne Margarita, MD   Order Providers  Prescribing Provider Encounter Provider  Sueanne Margarita, MD Sueanne Margarita, MD   Outpatient Medication Detail   Disp Refills Start End   furosemide (LASIX) 40 MG tablet 135 tablet 3 05/11/2021    Sig: Take 1 tablet (40 mg total) daily. Take an extra 1/2 tablet (20 mg total) as needed.   Patient taking differently: 20 mg daily. Take 1 tablet (40 mg total) daily. Take an extra 1/2 tablet (20 mg total) as needed. Patient states that she is taking 20 mg currently       Sent to pharmacy as: furosemide (LASIX) 40 MG tablet   E-Prescribing Status: Receipt confirmed by pharmacy (05/11/2021  8:44 AM EDT)    [place], [address]   Additional Information  Associated Reports  View Encounter  Priority and Order Details

## 2022-04-12 NOTE — Telephone Encounter (Signed)
Spoke with pt regarding [facility] referral and advised her to extend her search out to either [facility] or [place]. If she comes across an office that she is interested in to please let us know so that the referral can be placed.

## 2022-04-19 ENCOUNTER — Encounter: Payer: Self-pay | Admitting: Adult Health

## 2022-04-19 ENCOUNTER — Other Ambulatory Visit: Payer: Self-pay

## 2022-04-19 MED ORDER — ANASTROZOLE 1 MG PO TABS: 1.0000 mg | ORAL_TABLET | Freq: Every day | ORAL | 3 refills | Status: DC

## 2022-05-04 ENCOUNTER — Encounter: Payer: Self-pay | Admitting: Physician Assistant

## 2022-05-08 ENCOUNTER — Other Ambulatory Visit: Payer: Self-pay | Admitting: Cardiology

## 2022-05-11 ENCOUNTER — Encounter: Payer: Self-pay | Admitting: Cardiology

## 2022-05-11 ENCOUNTER — Ambulatory Visit: Payer: Medicare Other | Attending: Cardiology | Admitting: Cardiology

## 2022-05-11 ENCOUNTER — Ambulatory Visit (INDEPENDENT_AMBULATORY_CARE_PROVIDER_SITE_OTHER): Payer: Medicare Other

## 2022-05-11 VITALS — BP 112/68 | HR 67 | Ht 62.0 in | Wt 153.2 lb

## 2022-05-11 DIAGNOSIS — E782 Mixed hyperlipidemia: Secondary | ICD-10-CM | POA: Diagnosis not present

## 2022-05-11 DIAGNOSIS — I270 Primary pulmonary hypertension: Secondary | ICD-10-CM

## 2022-05-11 DIAGNOSIS — R002 Palpitations: Secondary | ICD-10-CM

## 2022-05-11 DIAGNOSIS — I10 Essential (primary) hypertension: Secondary | ICD-10-CM

## 2022-05-11 DIAGNOSIS — G4733 Obstructive sleep apnea (adult) (pediatric): Secondary | ICD-10-CM

## 2022-05-11 DIAGNOSIS — I2510 Atherosclerotic heart disease of native coronary artery without angina pectoris: Secondary | ICD-10-CM

## 2022-05-11 NOTE — Progress Notes (Signed)
Cardiology Office Note:    Date:  05/11/2022   ID:  Madison, Clements Apr 19, 1946, MRN [id number]  PCP:  Leamon Arnt, MD  Cardiologist:  Fransico Him, MD    Referring MD: Leamon Arnt, MD   Chief Complaint  Patient presents with   Follow-up    Pulmonary HTN     History of Present Illness:    Madison Clements is a 76 y.o. female with a hx of  hyperlipidemia, GERD, hypertension and pulmonary hypertension.  She had an echocardiogram done and 2018 in [place] which showed normal LV function with the EF 55 to 69%, grade 2 diastolic dysfunction with mild to moderate TR and mild pulmonary hypertension with RVSP 39 mmHg.  Repeat 2D echo 1 year later was unchanged.  She has been on Lasix 40 mg daily.  She had a nuclear stress test done 2015 that showed no ischemia.  Cardiac catheterization done in 2016 showed mild CAD with calcified vessels.    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  2D echo done 04/05/2022 showed normal LV function with EF 60 to 48% and 1 diastolic dysfunction, mild to moderate TR and normal RVSP at 29 mmHg.  She had a home sleep study done showing moderate OSA with AHI 18/hr and O2 sats as low as 72%.  At last office visit she complained that she was very frustrated with her CPAP and wanted to be evaluated for the inspire device and was referred to ENT and felt to be a good candidate for inspire and was scheduled for a sleep induced endoscopy.  Unfortunately she was diagnosed with breast cancer in the interim and the sleep induced endoscopy was postponed.  She underwent lumpectomy on 09/09/2021.  She is here today for followup and is doing well.  She does have some tingling and pain in her left breast at times since her lumpectomy but no exertional chest pain. She denies any SOB, DOE, PND, orthopnea, dizziness or syncope. She  does notice that her heart rate gets faster with activity than it used to and easily goes up to 120's with a sensation of palpitations even sitting at rest. She occasionally has some LE edema at the end of the day.  She is compliant with her meds and is tolerating meds with no SE.      Past Medical History:  Diagnosis Date   Allergy 1990   Morphine following surgery   Arthritis    generalized   Atypical mole 02/24/2015   RGHT LATERAL THIGH MODERATE   Atypical mole 01/20/2022   Left Breast (mild)   Atypical mole 01/20/2022   Mid Back (mild)   Atypical nevi 02/24/2015   RIGHT NECK MILD   Atypical nevi 08/25/2015   LEFT UPPER ARM MILD   Atypical nevi 08/25/2015   LEFT LATERAL FOREARM MILD/FREE   Atypical nevi 02/23/2016   LEFT THIGH MODERATE/FREE   Atypical nevi 02/23/2016   LEFT MEDIAL LEG MODERATE/FREE   Atypical nevi 02/23/2016   LEFT UPPER BACK MILD /FREE   Atypical nevi 08/23/2016   RIGHT ANT PROX THIGH MILD /FREE   Atypical nevi 07/25/2018   MID LOWER BACK MODERATE   Atypical nevi 01/22/2019   LEFT MID BACK MILD /FREE   Atypical nevi 01/22/2019   LEFT NECK MILD   Atypical nevi 08/06/2019   LEFT INNER BREAST MODERATE W/S  Atypical nevi 08/06/2019   LEFT OUTER SIDE MILD/FREE   Blood transfusion without reported diagnosis    hx of   Breast cancer ([place]) 2022   CAD (coronary artery disease), native coronary artery 02/07/2019   Minimal CAD at the time of cath in 2016 in [facility]   Family history of breast cancer 08/19/2021   Family history of pancreatic cancer 08/19/2021   Fatty liver 05/21/2020   Gastric polyps 10/02/2018   EGD 03/2015; benign   GERD (gastroesophageal reflux disease) 10/02/2018   on meds   History of melanoma 10/02/2018   Abdomen 2012; 2013; s/p local excisions.    Hyperlipidemia    on meds   Hypertension    on meds   Melanoma ([place]) 2012   MM- central lower abdomen- (ECX) [facility]   Osteopenia    Polyp of colon,  adenomatous 10/02/2018   Colonoscopy 03/2015; recheck in 2021   PONV (postoperative nausea and vomiting)    Pulmonary hypertension, primary ([place]) 10/02/2018   SCCA (squamous cell carcinoma) of skin 09/18/2017   RIGHT ANT. DISTAL LOWER LEG TREATED BY DR. Delton Coombes   Sleep apnea    Sleep apnea 05/26/2020   uses CPAP    Past Surgical History:  Procedure Laterality Date   [place]   BREAST LUMPECTOMY WITH RADIOACTIVE SEED LOCALIZATION Left 09/09/2021   Procedure: LEFT BREAST LUMPECTOMY WITH RADIOACTIVE SEED LOCALIZATION X2;  Surgeon: Donnie Mesa, MD;  Location: [facility];  Service: General;  Laterality: Left;   COLONOSCOPY  2016   in [place]-hx of polyps   EYE SURGERY  2013 torn retina laser   MENISCUS REPAIR Right 2001   right knee   thumb surgery Left    TONSILLECTOMY AND ADENOIDECTOMY     tuabl ligation     TUBAL LIGATION  1975   WISDOM TOOTH EXTRACTION      Current Medications: Current Meds  Medication Sig   celecoxib (CELEBREX) 100 MG capsule TAKE 1 CAPSULE (100 MG TOTAL) BY MOUTH DAILY.   diltiazem (TIADYLT ER) 180 MG 24 hr capsule Take 1 capsule (180 mg total) by mouth daily.   folic acid (FOLVITE) 381 MCG tablet Take 400 mcg by mouth daily.   furosemide (LASIX) 40 MG tablet Take 1 tablet (40 mg total) daily. Take an extra 1/2 tablet (20 mg total) as needed. (Patient taking differently: 20 mg daily. Take 1 tablet (40 mg total) daily. Take an extra 1/2 tablet (20 mg total) as needed. Patient states that she is taking 20 mg currently)   lisinopril (ZESTRIL) 10 MG tablet TAKE 1 TABLET (10 MG TOTAL) BY MOUTH DAILY.   omeprazole (PRILOSEC) 20 MG capsule TAKE 1 CAPSULE (20 MG TOTAL) BY MOUTH DAILY.   predniSONE (DELTASONE) 10 MG tablet Take 4 tabs qd x 2 days, 3 qd x 2 days, 2 qd x 2d, 1qd x 3 days   Probiotic Product (ADVANCED PROBIOTIC PO) Take 1 capsule by mouth daily.   rosuvastatin (CRESTOR) 40  MG tablet TAKE 1 TABLET (40 MG TOTAL) BY MOUTH AT BEDTIME.   Specialty Vitamins Products (ONE-A-DAY BONE STRENGTH PO) Take 1 tablet by mouth daily.      Allergies:   Morphine and Morphine and related   Social History   Socioeconomic History   Marital status: Widowed    Spouse name: Not on file   Number of children: 1   Years of education:  Not on file   Highest education level: Not on file  Occupational History   Occupation: Retired [employer]  Tobacco Use   Smoking status: Former    Packs/day: 0.25    Years: 10.00    Total pack years: 2.50    Types: Cigarettes    Quit date: 09/12/1996    Years since quitting: 25.6   Smokeless tobacco: Never  Vaping Use   Vaping Use: Never used  Substance and Sexual Activity   Alcohol use: Yes    Alcohol/week: 10.0 standard drinks of alcohol    Types: 5 Glasses of wine, 5 Shots of liquor per week    Comment: occassional    Drug use: Never   Sexual activity: Not Currently    Birth control/protection: Abstinence, Post-menopausal    Comment: hysterectomy  Other Topics Concern   Not on file  Social History Narrative   Widowed 2018 after 73 years of marriage   One daughter, 2 grandchildren. Lives in [place]   Sister lives in same complex in [place]    Previously lived in [place], [place] Strain: [place]  (12/28/2021)   Overall Financial Resource Strain (CARDIA)    Difficulty of Paying Living Expenses: Not hard at all  Food Insecurity: No Food Insecurity (12/28/2021)   Hunger Vital Sign    Worried About Running Out of Food in the Last Year: Never true    Ran Out of Food in the Last Year: Never true  Transportation Needs: No Transportation Needs (12/28/2021)   PRAPARE - Hydrologist (Medical): No    Lack of Transportation (Non-Medical): No  Physical Activity: Insufficiently Active (12/28/2021)   Exercise Vital Sign    Days of Exercise per Week: 5 days    Minutes  of Exercise per Session: 20 min  Stress: No Stress Concern Present (12/28/2021)   [place]    Feeling of Stress : Not at all  Social Connections: Moderately Integrated (12/28/2021)   Social Connection and Isolation Panel [NHANES]    Frequency of Communication with Friends and Family: More than three times a week    Frequency of Social Gatherings with Friends and Family: More than three times a week    Attends Religious Services: 1 to 4 times per year    Active Member of [employer] or Organizations: Yes    Attends Archivist Meetings: 1 to 4 times per year    Marital Status: Widowed     Family History: The patient's family history includes Arthritis in her mother, paternal grandmother, and sister; Asthma in her maternal grandfather and paternal grandfather; Breast cancer (age of onset: 21) in her sister; COPD in her paternal grandfather; Diabetes in her sister; Early death in her maternal grandmother; Heart attack in her father; Heart disease in her father, maternal uncle, maternal uncle, and paternal grandmother; High Cholesterol in her sister; High blood pressure in her father, maternal grandmother, mother, paternal grandmother, and sister; Hyperlipidemia in her mother; Hypertension in her father, maternal grandmother, mother, and paternal grandmother; Kidney disease in her maternal grandfather and paternal grandfather; Pancreatic cancer in her maternal aunt; Prostate cancer in her cousin; Stroke in her maternal grandmother, mother, and paternal aunt; Varicose Veins in her mother and sister. There is no history of Colon polyps, Colon cancer, Esophageal cancer, Rectal cancer, or Stomach cancer.  ROS:   Please see the history of present  illness.    ROS  All other systems reviewed and negative.   EKGs/Labs/Other Studies Reviewed:    The following studies were reviewed today: none  EKG:  EKG is ordered today.  The ekg  ordered today demonstrates NSR with iRBBB  Recent Labs: 07/09/2021: TSH 1.35 08/18/2021: ALT 27; BUN 20; Creatinine 0.89; Hemoglobin 14.5; Platelet Count 276; Potassium 3.6; Sodium 136   Recent Lipid Panel    Component Value Date/Time   CHOL 141 07/09/2021 0935   TRIG 135.0 07/09/2021 0935   HDL 48.20 07/09/2021 0935   CHOLHDL 3 07/09/2021 0935   VLDL 27.0 07/09/2021 0935   LDLCALC 66 07/09/2021 0935   LDLCALC 55 03/09/2021 0914   LDLDIRECT 68.0 01/14/2020 0904    Physical Exam:    VS:  BP 112/68   Pulse 67   Ht '5\' 2"'$  (1.575 m)   Wt 153 lb 3.2 oz (69.5 kg)   SpO2 96%   BMI 28.02 kg/m     Wt Readings from Last 3 Encounters:  05/11/22 153 lb 3.2 oz (69.5 kg)  02/16/22 153 lb 4.8 oz (69.5 kg)  11/15/21 151 lb 8 oz (68.7 kg)    GEN: Well nourished, well developed in no acute distress HEENT: Normal NECK: No JVD; No carotid bruits LYMPHATICS: No lymphadenopathy CARDIAC:RRR, no murmurs, rubs, gallops RESPIRATORY:  Clear to auscultation without rales, wheezing or rhonchi  ABDOMEN: Soft, non-tender, non-distended MUSCULOSKELETAL:  No edema; No deformity  SKIN: Warm and dry NEUROLOGIC:  Alert and oriented x 3 PSYCHIATRIC:  Normal affect   ASSESSMENT:    1. Pulmonary hypertension, primary ([place])   2. Coronary artery disease involving native coronary artery of native heart without angina pectoris   3. Mixed hyperlipidemia   4. Essential hypertension   5. OSA (obstructive sleep apnea)     PLAN:    In order of problems listed above:  1.  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 -sleep study showed moderate OSA with AHI 18/hr and O2 sats as low as 72%>>now on CPAP followed by Pulmonary -Last 2D echo 03/2022 showed normalization of PA pressures (RVSP 239mg) with treatment of sleep apnea on CPAP -She appears euvolemic on  exam today -continue CPAP therapy -continue Prescription drug management Lasix 20 mg daily with as needed refills -I have personally reviewed and interpreted outside labs performed by patient's PCP which showed serum creatinine 0.89 and potassium 3.6 on 08/18/2021  -Repeat  BMET  2.  Minimal CAD  -[facility]  -Nuclear stress test 03/2020 for coronary artery calcifications noted on chest CT year was normal.   -she has not had any anginal sx -Continue prescription drug management with aspirin 81 mg daily and high-dose statin therapy  3.  Hyperlipidemia  -LDL goal < 70 -I have personally reviewed and interpreted outside labs performed by patient's PCP which showed LDL 66 and HDL 48 on 07/09/2021 -Repeat FLP and ALT -Continue prescription drug management with atorvastatin 40 mg daily with as needed refills  4.  Hypertension  -BP is adequately controlled on exam -Continue prescription drug management with lisinopril 10 mg daily Cardizem CD 180 mg daily with as needed refills   5.  OSA -she has moderate OSA with an AHI of 17.9/hr on HST and is on CPAP -He was in the middle of  the work-up for the inspire device but then was diagnosed with breast cancer and this was placed on hold -I Will place a follow-up with Dr. Wilburn Cornelia to proceed with drug-induced endoscopy  6.  Palpitations -she has noticed at times HRs in the 120's even at rest -will get a 2 week ziopatch to assess for arrhythmias  Followup with me in 1 year   Time Spent: 25 minutes total time of encounter, including 15 minutes spent in face-to-face patient care on the date of this encounter. This time includes coordination of care and counseling regarding above mentioned problem list. Remainder of non-face-to-face time involved reviewing chart documents/testing relevant to the patient encounter and documentation in the medical record. I have independently reviewed documentation from referring  provider  Medication Adjustments/Labs and Tests Ordered: Current medicines are reviewed at length with the patient today.  Concerns regarding medicines are outlined above.  Orders Placed This Encounter  Procedures   EKG 12-Lead    No orders of the defined types were placed in this encounter.   Signed, Fransico Him, MD  05/11/2022 9:49 AM     [facility]

## 2022-05-11 NOTE — Patient Instructions (Signed)
Medication Instructions:  Your physician recommends that you continue on your current medications as directed. Please refer to the Current Medication list given to you today.  *If you need a refill on your cardiac medications before your next appointment, please call your pharmacy*  Lab Work: Fasting lipids and CMET  If you have labs (blood work) drawn today and your tests are completely normal, you will receive your results only by: [place] (if you have MyChart) OR A paper copy in the mail If you have any lab test that is abnormal or we need to change your treatment, we will call you to review the results.  Testing/Procedures: Your physician has recommended that you wear an event monitor. Event monitors are medical devices that record the heart's electrical activity. Doctors most often [place] these monitors to diagnose arrhythmias. Arrhythmias are problems with the speed or rhythm of the heartbeat. The monitor is a small, portable device. You can wear one while you do your normal daily activities. This is usually used to diagnose what is causing palpitations/syncope (passing out).  Follow-Up: At [facility], you and your health needs are our priority.  As part of our continuing mission to provide you with exceptional heart care, we have created designated Provider Care Teams.  These Care Teams include your primary Cardiologist (physician) and Advanced Practice Providers (APPs -  Physician Assistants and Nurse Practitioners) who all work together to provide you with the care you need, when you need it.  Your next appointment:   1 year(s)  The format for your next appointment:   In Person  Provider:   Fransico Him, MD     Other Instructions ZIO XT- Long Term Monitor Instructions  Your physician has requested you wear a ZIO patch monitor for 14 days.  This is a single patch monitor. [employer] supplies one patch monitor per enrollment. Additional stickers are not available.  Please do not apply patch if you will be having a Nuclear Stress Test,  Echocardiogram, Cardiac CT, MRI, or Chest Xray during the period you would be wearing the  monitor. The patch cannot be worn during these tests. You cannot remove and re-apply the  ZIO XT patch monitor.  Your ZIO patch monitor will be mailed 3 day USPS to your address on file. It may take 3-5 days  to receive your monitor after you have been enrolled.  Once you have received your monitor, please review the enclosed instructions. Your monitor  has already been registered assigning a specific monitor serial # to you.  Billing and Patient Assistance Program Information  We have supplied [employer] with any of your insurance information on file for billing purposes. [employer] offers a sliding scale Patient Assistance Program for patients that do not have  insurance, or whose insurance does not completely cover the cost of the ZIO monitor.  You must apply for the Patient Assistance Program to qualify for this discounted rate.  To apply, please call [employer] at [phone], select option 4, select option 2, ask to apply for  Patient Assistance Program. Theodore Demark will ask your household income, and how many people  are in your household. They will quote your out-of-pocket cost based on that information.  [employer] will also be able to set up a 44-month interest-free payment plan if needed.  Applying the monitor   Shave hair from upper left chest.  Hold abrader disc by orange tab. Rub abrader in 40 strokes over the upper left chest as  indicated in your  monitor instructions.  Clean area with 4 enclosed alcohol pads. Let dry.  Apply patch as indicated in monitor instructions. Patch will be placed under collarbone on left  side of chest with arrow pointing upward.  Rub patch adhesive wings for 2 minutes. Remove white label marked "1". Remove the white  label marked "2". Rub patch adhesive wings for 2 additional minutes.  While  looking in a mirror, press and release button in center of patch. A small green light will  flash 3-4 times. This will be your only indicator that the monitor has been turned on.  Do not shower for the first 24 hours. You may shower after the first 24 hours.  Press the button if you feel a symptom. You will hear a small click. Record Date, Time and  Symptom in the Patient Logbook.  When you are ready to remove the patch, follow instructions on the last 2 pages of Patient  Logbook. Stick patch monitor onto the last page of Patient Logbook.  Place Patient Logbook in the blue and white box. Use locking tab on box and tape box closed  securely. The blue and white box has prepaid postage on it. Please place it in the mailbox as  soon as possible. Your physician should have your test results approximately 7 days after the  monitor has been mailed back to [facility].  Call [place] at [phone] if you have questions regarding  your ZIO XT patch monitor. Call them immediately if you see an orange light blinking on your  monitor.  If your monitor falls off in less than 4 days, contact our Monitor department at [phone].  If your monitor becomes loose or falls off after 4 days call [employer] at [phone] for  suggestions on securing your monitor   Important Information About [place]

## 2022-05-11 NOTE — Addendum Note (Signed)
Addended by: Antonieta Iba on: 05/11/2022 10:00 AM   Modules accepted: Orders

## 2022-05-11 NOTE — Progress Notes (Unsigned)
Enrolled for [employer] to mail a ZIO XT long term holter monitor to the patients address on file.  

## 2022-05-12 DIAGNOSIS — G4733 Obstructive sleep apnea (adult) (pediatric): Secondary | ICD-10-CM

## 2022-05-12 DIAGNOSIS — E782 Mixed hyperlipidemia: Secondary | ICD-10-CM | POA: Diagnosis not present

## 2022-05-12 DIAGNOSIS — I270 Primary pulmonary hypertension: Secondary | ICD-10-CM | POA: Diagnosis not present

## 2022-05-12 DIAGNOSIS — I10 Essential (primary) hypertension: Secondary | ICD-10-CM | POA: Diagnosis not present

## 2022-05-12 DIAGNOSIS — R002 Palpitations: Secondary | ICD-10-CM | POA: Diagnosis not present

## 2022-05-12 DIAGNOSIS — I2510 Atherosclerotic heart disease of native coronary artery without angina pectoris: Secondary | ICD-10-CM | POA: Diagnosis not present

## 2022-05-18 ENCOUNTER — Other Ambulatory Visit: Payer: Self-pay | Admitting: Family Medicine

## 2022-05-18 ENCOUNTER — Ambulatory Visit
Admission: RE | Admit: 2022-05-18 | Discharge: 2022-05-18 | Disposition: A | Discharge status: Home / Self Care | Payer: Medicare Other | Source: Ambulatory Visit | Attending: Hematology | Admitting: Hematology

## 2022-05-18 ENCOUNTER — Ambulatory Visit: Payer: Medicare Other | Attending: Cardiology

## 2022-05-18 DIAGNOSIS — M810 Age-related osteoporosis without current pathological fracture: Secondary | ICD-10-CM | POA: Diagnosis not present

## 2022-05-18 DIAGNOSIS — G4733 Obstructive sleep apnea (adult) (pediatric): Secondary | ICD-10-CM | POA: Diagnosis not present

## 2022-05-18 DIAGNOSIS — Z780 Asymptomatic menopausal state: Secondary | ICD-10-CM | POA: Diagnosis not present

## 2022-05-18 DIAGNOSIS — M85851 Other specified disorders of bone density and structure, right thigh: Secondary | ICD-10-CM | POA: Diagnosis not present

## 2022-05-18 DIAGNOSIS — I10 Essential (primary) hypertension: Secondary | ICD-10-CM | POA: Diagnosis not present

## 2022-05-18 DIAGNOSIS — I270 Primary pulmonary hypertension: Secondary | ICD-10-CM | POA: Diagnosis not present

## 2022-05-18 DIAGNOSIS — E782 Mixed hyperlipidemia: Secondary | ICD-10-CM | POA: Insufficient documentation

## 2022-05-18 DIAGNOSIS — E2839 Other primary ovarian failure: Secondary | ICD-10-CM

## 2022-05-18 DIAGNOSIS — I2510 Atherosclerotic heart disease of native coronary artery without angina pectoris: Secondary | ICD-10-CM | POA: Insufficient documentation

## 2022-05-18 LAB — COMPREHENSIVE METABOLIC PANEL
ALT: 21 IU/L (ref 0–32)
AST: 25 IU/L (ref 0–40)
Albumin/Globulin Ratio: 2.4 — ABNORMAL HIGH (ref 1.2–2.2)
Albumin: 4.4 g/dL (ref 3.8–4.8)
Alkaline Phosphatase: 93 IU/L (ref 44–121)
BUN/Creatinine Ratio: 17 (ref 12–28)
BUN: 18 mg/dL (ref 8–27)
Bilirubin Total: 1 mg/dL (ref 0.0–1.2)
CO2: 23 mmol/L (ref 20–29)
Calcium: 10 mg/dL (ref 8.7–10.3)
Chloride: 102 mmol/L (ref 96–106)
Creatinine, Ser: 1.09 mg/dL — ABNORMAL HIGH (ref 0.57–1.00)
Globulin, Total: 1.8 g/dL (ref 1.5–4.5)
Glucose: 98 mg/dL (ref 70–99)
Potassium: 4.2 mmol/L (ref 3.5–5.2)
Sodium: 141 mmol/L (ref 134–144)
Total Protein: 6.2 g/dL (ref 6.0–8.5)
eGFR: 52 mL/min/{1.73_m2} — ABNORMAL LOW (ref >59)

## 2022-05-18 LAB — LIPID PANEL
Chol/HDL Ratio: 2.9 ratio (ref 0.0–4.4)
Cholesterol, Total: 137 mg/dL (ref 100–199)
HDL: 48 mg/dL (ref >39)
LDL Chol Calc (NIH): 63 mg/dL (ref 0–99)
Triglycerides: 155 mg/dL — ABNORMAL HIGH (ref 0–149)
VLDL Cholesterol Cal: 26 mg/dL (ref 5–40)

## 2022-05-20 ENCOUNTER — Inpatient Hospital Stay: Payer: Medicare Other | Attending: Radiation Oncology

## 2022-05-20 ENCOUNTER — Ambulatory Visit: Payer: Medicare Other | Admitting: Hematology

## 2022-05-20 ENCOUNTER — Inpatient Hospital Stay (HOSPITAL_BASED_OUTPATIENT_CLINIC_OR_DEPARTMENT_OTHER): Payer: Medicare Other | Admitting: Adult Health

## 2022-05-20 ENCOUNTER — Encounter: Payer: Self-pay | Admitting: Adult Health

## 2022-05-20 ENCOUNTER — Other Ambulatory Visit: Payer: Medicare Other

## 2022-05-20 VITALS — BP 147/64 | HR 64 | Temp 97.7°F | Resp 16 | Ht 62.0 in | Wt 152.9 lb

## 2022-05-20 DIAGNOSIS — Z170 Estrogen receptor positive status [ER+]: Secondary | ICD-10-CM

## 2022-05-20 DIAGNOSIS — I10 Essential (primary) hypertension: Secondary | ICD-10-CM | POA: Diagnosis not present

## 2022-05-20 DIAGNOSIS — Z85820 Personal history of malignant melanoma of skin: Secondary | ICD-10-CM | POA: Insufficient documentation

## 2022-05-20 DIAGNOSIS — E559 Vitamin D deficiency, unspecified: Secondary | ICD-10-CM | POA: Diagnosis not present

## 2022-05-20 DIAGNOSIS — Z803 Family history of malignant neoplasm of breast: Secondary | ICD-10-CM | POA: Insufficient documentation

## 2022-05-20 DIAGNOSIS — Z800 Family history of malignant neoplasm of digestive organs: Secondary | ICD-10-CM | POA: Diagnosis not present

## 2022-05-20 DIAGNOSIS — Z87891 Personal history of nicotine dependence: Secondary | ICD-10-CM | POA: Insufficient documentation

## 2022-05-20 DIAGNOSIS — Z90710 Acquired absence of both cervix and uterus: Secondary | ICD-10-CM | POA: Diagnosis not present

## 2022-05-20 DIAGNOSIS — C50812 Malignant neoplasm of overlapping sites of left female breast: Secondary | ICD-10-CM

## 2022-05-20 LAB — CBC WITH DIFFERENTIAL/PLATELET
Abs Immature Granulocytes: 0.02 10*3/uL (ref 0.00–0.07)
Basophils Absolute: 0 10*3/uL (ref 0.0–0.1)
Basophils Relative: 0 %
Eosinophils Absolute: 0.1 10*3/uL (ref 0.0–0.5)
Eosinophils Relative: 2 %
HCT: 41 % (ref 36.0–46.0)
Hemoglobin: 14.3 g/dL (ref 12.0–15.0)
Immature Granulocytes: 0 %
Lymphocytes Relative: 27 %
Lymphs Abs: 1.5 10*3/uL (ref 0.7–4.0)
MCH: 30.3 pg (ref 26.0–34.0)
MCHC: 34.9 g/dL (ref 30.0–36.0)
MCV: 86.9 fL (ref 80.0–100.0)
Monocytes Absolute: 0.6 10*3/uL (ref 0.1–1.0)
Monocytes Relative: 11 %
Neutro Abs: 3.3 10*3/uL (ref 1.7–7.7)
Neutrophils Relative %: 60 %
Platelets: 258 10*3/uL (ref 150–400)
RBC: 4.72 MIL/uL (ref 3.87–5.11)
RDW: 12.2 % (ref 11.5–15.5)
WBC: 5.5 10*3/uL (ref 4.0–10.5)
nRBC: 0 % (ref 0.0–0.2)

## 2022-05-20 LAB — COMPREHENSIVE METABOLIC PANEL
ALT: 22 U/L (ref 0–44)
AST: 22 U/L (ref 15–41)
Albumin: 4.3 g/dL (ref 3.5–5.0)
Alkaline Phosphatase: 88 U/L (ref 38–126)
Anion gap: 6 (ref 5–15)
BUN: 18 mg/dL (ref 8–23)
CO2: 27 mmol/L (ref 22–32)
Calcium: 9.9 mg/dL (ref 8.9–10.3)
Chloride: 104 mmol/L (ref 98–111)
Creatinine, Ser: 1.01 mg/dL — ABNORMAL HIGH (ref 0.44–1.00)
GFR, Estimated: 57 mL/min — ABNORMAL LOW (ref >60)
Glucose, Bld: 104 mg/dL — ABNORMAL HIGH (ref 70–99)
Potassium: 3.8 mmol/L (ref 3.5–5.1)
Sodium: 137 mmol/L (ref 135–145)
Total Bilirubin: 1.2 mg/dL (ref 0.3–1.2)
Total Protein: 6.8 g/dL (ref 6.5–8.1)

## 2022-05-20 NOTE — Progress Notes (Signed)
[facility]:    Madison Clements, [address] [place] [address]   DIAGNOSIS:  Cancer Staging  Malignant neoplasm of overlapping sites of left breast in female, estrogen receptor positive ([place]) Staging form: Breast, AJCC 8th Edition - Clinical stage from 08/11/2021: Stage IA (cT1c, cN0, cM0, G2, ER+, PR+, HER2-) - Signed by Truitt Merle, MD on 08/18/2021 Stage prefix: Initial diagnosis Histologic grading system: 3 grade system   SUMMARY OF ONCOLOGIC HISTORY: Oncology History Overview Note   Cancer Staging  Malignant neoplasm of overlapping sites of left breast in female, estrogen receptor positive ([place]) Staging form: Breast, AJCC 8th Edition - Clinical stage from 08/11/2021: Stage IA (cT1c, cN0, cM0, G2, ER+, PR+, HER2-) - Signed by Truitt Merle, MD on 08/18/2021     Malignant neoplasm of overlapping sites of left breast in female, estrogen receptor positive ([place])  07/27/2021 Mammogram   EXAM: DIGITAL DIAGNOSTIC UNILATERAL LEFT MAMMOGRAM WITH TOMOSYNTHESIS AND CAD; ULTRASOUND LEFT BREAST LIMITED  IMPRESSION: 1. There is a suspicious mass in the left breast at 9 o'clock measuring 1.8 cm.   2. There is a 1.0 cm group of suspicious linear calcifications anterior to the suspicious mass in the left breast, as well as a faint 2 mm group which lie 1 cm lateral to the linear calcifications.   3.  No evidence of left axillary lymphadenopathy   08/11/2021 Cancer Staging   Staging form: Breast, AJCC 8th Edition - Clinical stage from 08/11/2021: Stage IA (cT1c, cN0, cM0, G2, ER+, PR+, HER2-) - Signed by Truitt Merle, MD on 08/18/2021 Stage prefix: Initial diagnosis Histologic grading system: 3 grade system   08/11/2021 Initial Biopsy   Diagnosis 1. Breast, left, needle core biopsy, 9 o'clock, ribbon clip - INVASIVE MAMMARY CARCINOMA - SEE COMMENT 2. Breast, left, needle core biopsy, lower inner quadrant, x clip - INVASIVE MAMMARY CARCINOMA -  MAMMARY CARCINOMA IN-SITU - CALCIFICATIONS - SEE COMMENT Microscopic Comment 1. The biopsy material shows an infiltrative proliferation of cells with arranged linearly and in small clusters. Based on the biopsy, the carcinoma appears Nottingham grade 2 of 3 and measures 1.2 cm in greatest linear extent. 2. Based on the biopsy, the carcinoma appears Nottingham grade 2 of 3 and measures 0.2 cm in greatest linear extent.  1. PROGNOSTIC INDICATORS Results: The tumor cells are NEGATIVE for Her2 (1+). Estrogen Receptor: 100%, POSITIVE, STRONG STAINING INTENSITY Progesterone Receptor: 60%, POSITIVE, STRONG STAINING INTENSITY Proliferation Marker Ki67: 10%  2. PROGNOSTIC INDICATORS Results: The tumor cells are NEGATIVE for Her2 (1+). Estrogen Receptor: 100%, POSITIVE, STRONG STAINING INTENSITY Progesterone Receptor: 60%, POSITIVE, STRONG STAINING INTENSITY Proliferation Marker Ki67: 15%   08/13/2021 Mammogram   EXAM: DIGITAL DIAGNOSTIC UNILATERAL RIGHT MAMMOGRAM WITH TOMOSYNTHESIS AND CAD  IMPRESSION: No mammographic evidence for malignancy.   08/16/2021 Initial Diagnosis   Malignant neoplasm of overlapping sites of left breast in female, estrogen receptor positive ([place])   08/25/2021 Imaging   EXAM: BILATERAL BREAST MRI WITH AND WITHOUT CONTRAST  IMPRESSION: 1. Biopsy-proven invasive ductal carcinoma measuring approximately 2.8 x 1.7 x 1.5 cm in the inner breast at middle depth, associated with the ribbon shaped tissue marking clip placed at the time of core needle biopsy. Enhancement extends approximately 1.4 cm posterior to the clip. 2. Approximate 1.8 cm post biopsy hematoma with associated rim enhancement at the site of the biopsy-proven invasive ductal carcinoma and DCIS in the LOWER INNER QUADRANT at anterior depth associated with the X shaped tissue marking  clip. 3. In combination, the overall enhancement spans approximately 4 cm. 4. No MRI evidence of malignancy involving the  RIGHT breast. 5. No pathologic lymphadenopathy.   08/27/2021 Genetic Testing   egative hereditary cancer genetic testing: no pathogenic variants detected in [facility] BRCAPlus Panel and CancerNext-Expanded +RNAinsight Panel.  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  The report dates are 08/27/2021 and 08/30/2021.   The BRCAplus panel offered by [employer] and includes sequencing and deletion/duplication analysis for the following 8 genes: ATM, BRCA1, BRCA2, CDH1, CHEK2, PALB2, PTEN, and TP53.  The CancerNext-Expanded gene panel offered by [facility] and includes sequencing, rearrangement, and RNA analysis for the following 77 genes: AIP, ALK, APC, ATM, AXIN2, BAP1, BARD1, BLM, BMPR1A, BRCA1, BRCA2, BRIP1, CDC73, CDH1, CDK4, CDKN1B, CDKN2A, CHEK2, CTNNA1, DICER1, FANCC, FH, FLCN, GALNT12, KIF1B, LZTR1, MAX, MEN1, MET, MLH1, MSH2, MSH3, MSH6, MUTYH, NBN, NF1, NF2, NTHL1, PALB2, PHOX2B, PMS2, POT1, PRKAR1A, PTCH1, PTEN, RAD51C, RAD51D, RB1, RECQL, RET, SDHA, SDHAF2, SDHB, SDHC, SDHD, SMAD4, SMARCA4, SMARCB1, SMARCE1, STK11, SUFU, TMEM127, TP53, TSC1, TSC2, VHL and XRCC2 (sequencing and deletion/duplication); EGFR, EGLN1, HOXB13, KIT, MITF, PDGFRA, POLD1, and POLE (sequencing only); EPCAM and GREM1 (deletion/duplication only).    09/09/2021 Definitive Surgery   FINAL MICROSCOPIC DIAGNOSIS:   A. BREAST, LEFT, LUMPECTOMY:  -  Invasive ductal carcinoma, Nottingham grade 2 of 3, 3.5 cm  -  Ductal carcinoma in-situ, intermediate grade  -  Margins uninvolved by carcinoma (<0.1 cm; anterior)  -  Previous biopsy site changes present    09/09/2021 Oncotype testing   Oncotype DX was obtained on the final surgical sample and the recurrence score of 21 predicts a risk of recurrence outside the breast over the next 9 years of 7%, if the patient's only systemic therapy is an antiestrogen for 5 years.  It also predicts no benefit from chemotherapy.   10/20/2021 - 11/16/2021  Radiation Therapy   Site Technique Total Dose (Gy) Dose per Fx (Gy) Completed Fx Beam Energies  Breast, Left: Breast_L_axilla 3D 40.05/40.05 2.67 15/15 10X  Breast, Left: Breast_L_Bst 3D 10/10 2 5/5 6X, 10X     12/06/2021 - 04/2022 Anti-estrogen oral therapy   Tamoxifen x 5 years unable to tolerate, attempted anastrozole and had similar side effects.  Opted to forego adjuvant antiestrogen therapy     CURRENT THERAPY: Observation  INTERVAL HISTORY: Madison Clements 76 y.o. female returns for follow-up of her history of breast cancer.  She was unable to tolerate tamoxifen and then she tried taking anastrozole which caused similar side effects as the tamoxifen and she stopped taking this therapy on August 29.  She has since been feeling better and does not desire to start any additional antiestrogen therapy.   She underwent bone density testing on May 18, 2022 and learned that she does have osteoporosis noted in her left forearm.  Please note in 2021 this area was not measured and her osteopenia in her hip increased from -1.5 to -2.1 however remains at osteopenia in that area.   Otherwise she is doing well and has no concerns.     Patient Active Problem List   Diagnosis Date Noted   Genetic testing 08/30/2021   Family history of breast cancer 08/19/2021   Family history of pancreatic cancer 08/19/2021   Malignant neoplasm of overlapping sites of left breast in female, estrogen receptor positive ([place]) 08/16/2021   Hepatic steatosis 07/28/2020   OSA (obstructive sleep apnea) 05/26/2020   GERD (gastroesophageal reflux disease) 10/02/2018  Essential hypertension 10/02/2018   Mixed hyperlipidemia 10/02/2018   Osteopenia 10/02/2018   Primary osteoarthritis involving multiple joints 10/02/2018   Pulmonary hypertension, primary ([place]) 10/02/2018   History of melanoma 10/02/2018   Polyp of colon, adenomatous 10/02/2018   Gastric polyps 10/02/2018    is allergic to morphine and  morphine and related.  MEDICAL HISTORY: Past Medical History:  Diagnosis Date   Allergy 1990   Morphine following surgery   Arthritis    generalized   Atypical mole 02/24/2015   RGHT LATERAL THIGH MODERATE   Atypical mole 01/20/2022   Left Breast (mild)   Atypical mole 01/20/2022   Mid Back (mild)   Atypical nevi 02/24/2015   RIGHT NECK MILD   Atypical nevi 08/25/2015   LEFT UPPER ARM MILD   Atypical nevi 08/25/2015   LEFT LATERAL FOREARM MILD/FREE   Atypical nevi 02/23/2016   LEFT THIGH MODERATE/FREE   Atypical nevi 02/23/2016   LEFT MEDIAL LEG MODERATE/FREE   Atypical nevi 02/23/2016   LEFT UPPER BACK MILD /FREE   Atypical nevi 08/23/2016   RIGHT ANT PROX THIGH MILD /FREE   Atypical nevi 07/25/2018   MID LOWER BACK MODERATE   Atypical nevi 01/22/2019   LEFT MID BACK MILD /FREE   Atypical nevi 01/22/2019   LEFT NECK MILD   Atypical nevi 08/06/2019   LEFT INNER BREAST MODERATE W/S   Atypical nevi 08/06/2019   LEFT OUTER SIDE MILD/FREE   Blood transfusion without reported diagnosis    hx of   Breast cancer ([place]) 2022   CAD (coronary artery disease), native coronary artery 02/07/2019   Minimal CAD at the time of cath in 2016 in [place]   Family history of breast cancer 08/19/2021   Family history of pancreatic cancer 08/19/2021   Fatty liver 05/21/2020   Gastric polyps 10/02/2018   EGD 03/2015; benign   GERD (gastroesophageal reflux disease) 10/02/2018   on meds   History of melanoma 10/02/2018   Abdomen 2012; 2013; s/p local excisions.    Hyperlipidemia    on meds   Hypertension    on meds   Melanoma ([place]) 2012   MM- central lower abdomen- ([facility]) [facility]   Osteopenia    Polyp of colon, adenomatous 10/02/2018   Colonoscopy 03/2015; recheck in 2021   PONV (postoperative nausea and vomiting)    Pulmonary hypertension, primary ([place]) 10/02/2018   SCCA (squamous cell carcinoma) of skin 09/18/2017   RIGHT ANT. DISTAL LOWER LEG  TREATED BY DR. Delton Coombes   Sleep apnea    Sleep apnea 05/26/2020   uses CPAP    SURGICAL HISTORY: Past Surgical History:  Procedure Laterality Date   ABDOMINAL HYSTERECTOMY  [place]   BREAST LUMPECTOMY WITH RADIOACTIVE SEED LOCALIZATION Left 09/09/2021   Procedure: LEFT BREAST LUMPECTOMY WITH RADIOACTIVE SEED LOCALIZATION X2;  Surgeon: Donnie Mesa, MD;  Location: [facility];  Service: General;  Laterality: Left;   COLONOSCOPY  2016   in [place]-hx of polyps   EYE SURGERY  2013 torn retina laser   MENISCUS REPAIR Right 2001   right knee   thumb surgery Left    TONSILLECTOMY AND ADENOIDECTOMY     tuabl ligation     TUBAL LIGATION  1975   WISDOM TOOTH EXTRACTION      SOCIAL HISTORY: Social History   Socioeconomic History   Marital status: Widowed    Spouse name: Not on file  Number of children: 1   Years of education: Not on file   Highest education level: Not on file  Occupational History   Occupation: Retired [employer]  Tobacco Use   Smoking status: Former    Packs/day: 0.25    Years: 10.00    Total pack years: 2.50    Types: Cigarettes    Quit date: 09/12/1996    Years since quitting: 25.7   Smokeless tobacco: Never  Vaping Use   Vaping Use: Never used  Substance and Sexual Activity   Alcohol use: Yes    Alcohol/week: 10.0 standard drinks of alcohol    Types: 5 Glasses of wine, 5 Shots of liquor per week    Comment: occassional    Drug use: Never   Sexual activity: Not Currently    Birth control/protection: Abstinence, Post-menopausal    Comment: hysterectomy  Other Topics Concern   Not on file  Social History Narrative   Widowed 2018 after 33 years of marriage   One daughter, 2 grandchildren. Lives in [place]   Sister lives in same complex in [place]    Previously lived in [place], [place] Strain: [place]  (12/28/2021)   Overall Financial  Resource Strain (CARDIA)    Difficulty of Paying Living Expenses: Not hard at all  Food Insecurity: No Food Insecurity (12/28/2021)   Hunger Vital Sign    Worried About Running Out of Food in the Last Year: Never true    Ran Out of Food in the Last Year: Never true  Transportation Needs: No Transportation Needs (12/28/2021)   PRAPARE - Hydrologist (Medical): No    Lack of Transportation (Non-Medical): No  Physical Activity: Insufficiently Active (12/28/2021)   Exercise Vital Sign    Days of Exercise per Week: 5 days    Minutes of Exercise per Session: 20 min  Stress: No Stress Concern Present (12/28/2021)   [place]    Feeling of Stress : Not at all  Social Connections: Moderately Integrated (12/28/2021)   Social Connection and Isolation Panel [NHANES]    Frequency of Communication with Friends and Family: More than three times a week    Frequency of Social Gatherings with Friends and Family: More than three times a week    Attends Religious Services: 1 to 4 times per year    Active Member of [employer] or Organizations: Yes    Attends Archivist Meetings: 1 to 4 times per year    Marital Status: Widowed  Intimate Partner Violence: Not At Risk (12/28/2021)   Humiliation, Afraid, Rape, and Kick questionnaire    Fear of Current or Ex-Partner: No    Emotionally Abused: No    Physically Abused: No    Sexually Abused: No    FAMILY HISTORY: Family History  Problem Relation Age of Onset   Arthritis Mother    High blood pressure Mother    Hyperlipidemia Mother    Hypertension Mother    Stroke Mother    Varicose Veins Mother    Heart attack Father    Heart disease Father    High blood pressure Father    Hypertension Father    Arthritis Sister    Diabetes Sister    High Cholesterol Sister    High blood pressure Sister    Breast cancer Sister 50   Varicose Veins Sister     Pancreatic cancer  Maternal Aunt        dx early 75s   Heart disease Maternal Uncle    Heart disease Maternal Uncle    Stroke Paternal Aunt    Early death Maternal Grandmother        Stroke at 12   High blood pressure Maternal Grandmother    Hypertension Maternal Grandmother    Stroke Maternal Grandmother    Asthma Maternal Grandfather    Kidney disease Maternal Grandfather    Arthritis Paternal Grandmother    Heart disease Paternal Grandmother    High blood pressure Paternal Grandmother    Hypertension Paternal Grandmother    Kidney disease Paternal Grandfather    Asthma Paternal Grandfather    COPD Paternal Grandfather    Prostate cancer Cousin        paternal cousins x2; dx after 4   Colon polyps Neg Hx    Colon cancer Neg Hx    Esophageal cancer Neg Hx    Rectal cancer Neg Hx    Stomach cancer Neg Hx     Review of Systems  Constitutional:  Negative for appetite change, chills, fatigue, fever and unexpected weight change.  HENT:   Negative for hearing loss, lump/mass and trouble swallowing.   Eyes:  Negative for eye problems and icterus.  Respiratory:  Negative for chest tightness, cough and shortness of breath.   Cardiovascular:  Negative for chest pain, leg swelling and palpitations.  Gastrointestinal:  Negative for abdominal distention, abdominal pain, constipation, diarrhea, nausea and vomiting.  Endocrine: Negative for hot flashes.  Genitourinary:  Negative for difficulty urinating.   Musculoskeletal:  Negative for arthralgias.  Skin:  Negative for itching and rash.  Neurological:  Negative for dizziness, extremity weakness, headaches and numbness.  Hematological:  Negative for adenopathy. Does not bruise/bleed easily.  Psychiatric/Behavioral:  Negative for depression. The patient is not nervous/anxious.       PHYSICAL EXAMINATION  ECOG PERFORMANCE STATUS: 1 - Symptomatic but completely ambulatory  Vitals:   05/20/22 1147  BP: (!) 147/64  Pulse: 64  Resp:  16  Temp: 97.7 F (36.5 C)  SpO2: 98%    Physical Exam Constitutional:      General: She is not in acute distress.    Appearance: Normal appearance. She is not toxic-appearing.  HENT:     Head: Normocephalic and atraumatic.  Eyes:     General: No scleral icterus. Cardiovascular:     Rate and Rhythm: Normal rate and regular rhythm.     Pulses: Normal pulses.     Heart sounds: Normal heart sounds.  Pulmonary:     Effort: Pulmonary effort is normal.     Breath sounds: Normal breath sounds.  Chest:     Comments: Left breast status postlumpectomy and radiation no sign of local recurrence right breast is benign. Abdominal:     General: Abdomen is flat. Bowel sounds are normal. There is no distension.     Palpations: Abdomen is soft.     Tenderness: There is no abdominal tenderness.  Musculoskeletal:        General: No swelling.     Cervical back: Neck supple.  Lymphadenopathy:     Cervical: No cervical adenopathy.  Skin:    General: Skin is warm and dry.     Findings: No rash.  Neurological:     General: No focal deficit present.     Mental Status: She is alert.  Psychiatric:        Mood and Affect: Mood normal.  Behavior: Behavior normal.     LABORATORY DATA:  CBC    Component Value Date/Time   WBC 5.5 05/20/2022 1121   RBC 4.72 05/20/2022 1121   HGB 14.3 05/20/2022 1121   HGB 14.5 08/18/2021 1234   HCT 41.0 05/20/2022 1121   PLT 258 05/20/2022 1121   PLT 276 08/18/2021 1234   MCV 86.9 05/20/2022 1121   MCH 30.3 05/20/2022 1121   MCHC 34.9 05/20/2022 1121   RDW 12.2 05/20/2022 1121   LYMPHSABS 1.5 05/20/2022 1121   MONOABS 0.6 05/20/2022 1121   EOSABS 0.1 05/20/2022 1121   BASOSABS 0.0 05/20/2022 1121    CMP     Component Value Date/Time   NA 137 05/20/2022 1121   NA 141 05/18/2022 1200   K 3.8 05/20/2022 1121   CL 104 05/20/2022 1121   CO2 27 05/20/2022 1121   GLUCOSE 104 (H) 05/20/2022 1121   BUN 18 05/20/2022 1121   BUN 18 05/18/2022  1200   CREATININE 1.01 (H) 05/20/2022 1121   CREATININE 0.89 08/18/2021 1234   CREATININE 1.01 (H) 07/28/2020 0901   CALCIUM 9.9 05/20/2022 1121   PROT 6.8 05/20/2022 1121   PROT 6.2 05/18/2022 1200   ALBUMIN 4.3 05/20/2022 1121   ALBUMIN 4.4 05/18/2022 1200   AST 22 05/20/2022 1121   AST 24 08/18/2021 1234   ALT 22 05/20/2022 1121   ALT 27 08/18/2021 1234   ALKPHOS 88 05/20/2022 1121   BILITOT 1.2 05/20/2022 1121   BILITOT 1.0 05/18/2022 1200   BILITOT 1.2 08/18/2021 1234   GFRNONAA 57 (L) 05/20/2022 1121   GFRNONAA >60 08/18/2021 1234   GFRNONAA 54 (L) 07/28/2020 0901   GFRAA 63 07/28/2020 0901     ASSESSMENT and THERAPY PLAN:   Malignant neoplasm of overlapping sites of left breast in female, estrogen receptor positive ([place]) Madison Clements is a 76 year old woman with history of stage Ia estrogen progesterone positive breast cancer in her left breast diagnosed in November 2022 status postlumpectomy, adjuvant radiation and unable to tolerate antiestrogen therapy.  She has no clinical or radiographic sign of breast cancer recurrence.  We discussed the risks and benefits of further adjuvant antiestrogen therapy and she does understand that her risk is increased with foregoing adjuvant antiestrogen therapy however considering the side effect impact on her quality of life, the risks for her do not outweigh the benefits.  We discussed her bone density in detail.  I gave her information about Boniva and also Fosamax.  She is going to read about those and we will see her back in January to discuss this further.  I recommended that she talk to her dentist about bisphosphonate therapy prior to [place] starting her treatment.  We also discussed optimizing calcium, vitamin D, and weightbearing exercises.  Madison Clements will return in January 2024 for labs, follow-up.  She will see Dr. Georgette Dover in July, 2024   All questions were answered. The patient knows to call the clinic with any problems, questions or  concerns. We can certainly see the patient much sooner if necessary.  Total encounter time:45 minutes*in face-to-face visit time, chart review, lab review, care coordination, order entry, and documentation of the encounter time.  Wilber Bihari, NP 05/20/22 3:21 PM Medical Oncology and Hematology [facility] [place],  [address] Tel. [phone]    Fax. [phone]  *Total Encounter Time as defined by the Centers for Medicare and Medicaid Services includes, in addition to the face-to-face time of a patient visit (documented  in the note above) non-face-to-face time: obtaining and reviewing outside history, ordering and reviewing medications, tests or procedures, care coordination (communications with other health care professionals or caregivers) and documentation in the medical record.

## 2022-05-20 NOTE — Assessment & Plan Note (Signed)
Madison Clements is a 76 year old woman with history of stage Ia estrogen progesterone positive breast cancer in her left breast diagnosed in November 2022 status postlumpectomy, adjuvant radiation and unable to tolerate antiestrogen therapy.  She has no clinical or radiographic sign of breast cancer recurrence.  We discussed the risks and benefits of further adjuvant antiestrogen therapy and she does understand that her risk is increased with foregoing adjuvant antiestrogen therapy however considering the side effect impact on her quality of life, the risks for her do not outweigh the benefits.  We discussed her bone density in detail.  I gave her information about Boniva and also Fosamax.  She is going to read about those and we will see her back in January to discuss this further.  I recommended that she talk to her dentist about bisphosphonate therapy prior to [place] starting her treatment.  We also discussed optimizing calcium, vitamin D, and weightbearing exercises.  Madison Clements will return in January 2024 for labs, follow-up.  She will see Dr. Georgette Dover in July, 2024

## 2022-05-20 NOTE — Patient Instructions (Signed)
Alendronate Tablets What is this medication? ALENDRONATE (a LEN droe nate) prevents and treats osteoporosis. It may also be used to treat Paget disease of the bone. It works by making your bones stronger and less likely to break (fracture). It belongs to a group of medications called bisphosphonates. This medicine may be used for other purposes; ask your health care provider or pharmacist if you have questions. COMMON BRAND NAME(S): Fosamax What should I tell my care team before I take this medication? They need to know if you have any of these conditions: Bleeding disorder Cancer Dental disease Difficulty swallowing Infection (fever, chills, cough, sore throat, pain or trouble passing urine) Kidney disease Low levels of calcium or other minerals in the blood Low red blood cell counts Receiving steroids like dexamethasone or prednisone Stomach or intestine problems Trouble sitting or standing for 30 minutes An unusual or allergic reaction to alendronate, other medications, foods, dyes or preservatives Pregnant or trying to get pregnant Breast-feeding How should I use this medication? Take this medication by mouth with a full glass of water. Take it as directed on the prescription label at the same time every day. Take the dose right after waking up. Do not eat or drink anything before taking it. Do not take it with any other drink except water. Do not chew or crush the tablet. After taking it, do not eat breakfast, drink, or take any other medications or vitamins for at least 30 minutes. Sit or stand up for at least 30 minutes after you take it. Do not lie down. Keep taking it unless your care team tells you to stop. A special MedGuide will be given to you by the pharmacist with each prescription and refill. Be sure to read this information carefully each time. Talk to your care team about the use of this medication in children. Special care may be needed. Overdosage: If you think you have  taken too much of this medicine contact a poison control center or emergency room at once. NOTE: This medicine is only for you. Do not share this medicine with others. What if I miss a dose? If you take your medication once a day, skip it. Take your next dose at the scheduled time the next morning. Do not take two doses on the same day. If you take your medication once a week, take the missed dose on the morning after you remember. Do not take two doses on the same day. What may interact with this medication? Aluminum hydroxide Antacids Aspirin Calcium supplements Medications for inflammation like ibuprofen, naproxen, and others Iron supplements Magnesium supplements Vitamins with minerals This list may not describe all possible interactions. Give your health care provider a list of all the medicines, herbs, non-prescription drugs, or dietary supplements you use. Also tell them if you smoke, drink alcohol, or use illegal drugs. Some items may interact with your medicine. What should I watch for while using this medication? Visit your care team for regular checks on your progress. It may be some time before you see the benefit from this medication. Some people who take this medication have severe bone, joint, or muscle pain. This medication may also increase your risk for jaw problems or a broken thigh bone. Tell your care team right away if you have severe pain in your jaw, bones, joints, or muscles. Tell you care team if you have any pain that does not go away or that gets worse. Tell your dentist and dental surgeon that you are   taking this medication. You should not have major dental surgery while on this medication. See your dentist to have a dental exam and fix any dental problems before starting this medication. Take good care of your teeth while on this medication. Make sure you see your dentist for regular follow-up appointments. You should make sure you get enough calcium and vitamin D  while you are taking this medication. Discuss the foods you eat and the vitamins you take with your care team. You may need blood work done while you are taking this medication. What side effects may I notice from receiving this medication? Side effects that you should report to your care team as soon as possible: Allergic reactions--skin rash, itching, hives, swelling of the face, lips, tongue, or throat Low calcium level--muscle pain or cramps, confusion, tingling, or numbness in the hands or feet Osteonecrosis of the jaw--pain, swelling, or redness in the mouth, numbness of the jaw, poor healing after dental work, unusual discharge from the mouth, visible bones in the mouth Pain or trouble swallowing Severe bone, joint, or muscle pain Stomach bleeding--bloody or black, tar-like stools, vomiting blood or brown material that looks like coffee grounds Side effects that usually do not require medical attention (report to your care team if they continue or are bothersome): Constipation Diarrhea Nausea Stomach pain This list may not describe all possible side effects. Call your doctor for medical advice about side effects. You may report side effects to FDA at 1-800-FDA-1088. Where should I keep my medication? Keep out of the reach of children and pets. Store at room temperature between 15 and 30 degrees C (59 and 86 degrees F). Throw away any unused medication after the expiration date. NOTE: This sheet is a summary. It may not cover all possible information. If you have questions about this medicine, talk to your doctor, pharmacist, or health care provider.  2023 Elsevier/Gold Standard (2007-10-20 00:00:00) Ibandronate Monthly Tablets What is this medication? IBANDRONATE (i BAN droh nate) prevents and treats osteoporosis. It works by Paramedic stronger and less likely to break (fracture). It belongs to a group of medications called bisphosphonates. This medicine may be used for other  purposes; ask your health care provider or pharmacist if you have questions. COMMON BRAND NAME(S): Boniva What should I tell my care team before I take this medication? They need to know if you have any of these conditions: Bleeding disorder Cancer Dental disease Difficulty swallowing Kidney disease Low levels of calcium or other minerals in the blood Low red blood cell level Receiving steroids, such as dexamethasone or prednisone Stomach or intestine problems An unusual or allergic reaction to ibandronate, other medications, foods, dyes, or preservatives Pregnant or trying to get pregnant Breast-feeding How should I use this medication? Take this medication by mouth with a full glass of water. Take it as directed on the prescription label on the same day of each month.Take the dose right after waking up. Do not eat or drink anything before taking it. Do not take it with any other drink except water. Do not chew or crush the tablet. After taking it, do not eat breakfast, drink, or take any other drugs or vitamins for at least 30 minutes. Sit or stand up for at least 60 minutes after you take it. Do not lie down. Keep taking it unless your health care provider tells you to stop. A special MedGuide will be given to you by the pharmacist with each prescription and refill. Be sure to  read this information carefully each time. Talk to your care team about the use of this medication in children. Special care may be needed. Overdosage: If you think you have taken too much of this medicine contact a poison control center or emergency room at once. NOTE: This medicine is only for you. Do not share this medicine with others. What if I miss a dose? If you miss a dose and the next scheduled dose is more than 7 days away, take the missed dose on the morning after you remember. Do not take two doses on the same day. If you miss a dose and the next scheduled dose is only 1 to 7 days away, skip it. Take the  next dose on the morning of the next scheduled dose. Do not take two doses on the same day. What may interact with this medication? Aluminum hydroxide Antacids Aspirin Calcium supplements Medications for inflammation, such as ibuprofen, naproxen, other NSAIDs Iron supplements Magnesium supplements Vitamins with minerals This list may not describe all possible interactions. Give your health care provider a list of all the medicines, herbs, non-prescription drugs, or dietary supplements you use. Also tell them if you smoke, drink alcohol, or use illegal drugs. Some items may interact with your medicine. What should I watch for while using this medication? Visit your care team for regular checks on your progress. It may be some time before you see the benefit from this medication. Some people who take this medication have severe bone, joint, or muscle pain. This medication may also increase your risk for jaw problems or a broken thigh bone. Tell your care team right away if you have severe pain in your jaw, bones, joints, or muscles. Tell your care team if you have any pain that does not go away or that gets worse. Tell your dentist and dental surgeon that you are taking this medication. You should not have major dental surgery while on this medication. See your dentist to have a dental exam and fix any dental problems before starting this medication. Take good care of your teeth while on this medication. Make sure you see your dentist for regular follow-up appointments. You should make sure you get enough calcium and vitamin D while you are taking this medication. Discuss the foods you eat and the vitamins you take with your care team. You may need bloodwork while taking this medication. What side effects may I notice from receiving this medication? Side effects that you should report to your care team as soon as possible: Allergic reactions--skin rash, itching, hives, swelling of the face, lips,  tongue, or throat Low calcium level--muscle pain or cramps, confusion, tingling, or numbness in the hands or feet Osteonecrosis of the jaw--pain, swelling, or redness in the mouth, numbness of the jaw, poor healing after dental work, unusual discharge from the mouth, visible bones in the mouth Pain or trouble swallowing Severe bone, joint, or muscle pain Stomach bleeding--bloody or black, tar-like stools, vomiting blood or brown material that looks like coffee grounds Side effects that usually do not require medical attention (report to your care team if they continue or are bothersome): Back pain Diarrhea Headache Nausea Stomach pain This list may not describe all possible side effects. Call your doctor for medical advice about side effects. You may report side effects to FDA at 1-800-FDA-1088. Where should I keep my medication? Keep out of the reach of children and pets. Store between 15 and 30 degrees C (59 and 86 degrees F).  Get rid of any unused medication after the expiration date. To get rid of medications that are no longer needed or have expired: Take the medication to a medication take-back program. Check with your pharmacy or law enforcement to find a location. If you cannot return the medication, check the label or package insert to see if the medication should be thrown out in the garbage or flushed down the toilet. If you are not sure, ask your care team. If it is safe to put it in the trash, pour the medication out of the container. Mix the medication with cat litter, dirt, coffee grounds, or other unwanted substance. Seal the mixture in a bag or container. Put it in the trash. NOTE: This sheet is a summary. It may not cover all possible information. If you have questions about this medicine, talk to your doctor, pharmacist, or health care provider.  2023 Elsevier/Gold Standard (2021-10-15 00:00:00)

## 2022-05-23 ENCOUNTER — Telehealth: Payer: Self-pay | Admitting: Adult Health

## 2022-05-23 NOTE — Telephone Encounter (Signed)
Scheduled appt per 9/8 los. Left voicemail.

## 2022-06-03 DIAGNOSIS — I270 Primary pulmonary hypertension: Secondary | ICD-10-CM | POA: Diagnosis not present

## 2022-06-03 DIAGNOSIS — I10 Essential (primary) hypertension: Secondary | ICD-10-CM | POA: Diagnosis not present

## 2022-06-06 ENCOUNTER — Telehealth: Payer: Self-pay

## 2022-06-06 ENCOUNTER — Telehealth: Payer: Self-pay | Admitting: *Deleted

## 2022-06-06 ENCOUNTER — Encounter: Payer: Self-pay | Admitting: *Deleted

## 2022-06-06 MED ORDER — METOPROLOL SUCCINATE ER 25 MG PO TB24: 25.0000 mg | ORAL_TABLET | Freq: Every day | ORAL | 11 refills | Status: DC

## 2022-06-06 NOTE — Patient Instructions (Signed)
Visit Information  Thank you for taking time to visit with me today. Please don't hesitate to contact me if I can be of assistance to you.   Following are the goals we discussed today:   Goals Addressed               This Visit's Progress     COMPLETED: No needs (pt-stated)        Care Coordination Interventions: Provided education to patient and/or caregiver about advanced directives Reviewed medications with patient and discussed adherence and educated accordingly Reviewed scheduled/upcoming provider appointments including all pending appointments and verified pt has completed her AWV 2023. Screening for signs and symptoms of depression related to chronic disease state  Assessed social determinant of health barriers        Please call the care guide team at [phone] if you need to cancel or reschedule your appointment.   If you are experiencing a Mental Health or [place] or need someone to talk to, please call the Suicide and Crisis Lifeline: 988  Patient verbalizes understanding of instructions and care plan provided today and agrees to view in [facility]. Active MyChart status and patient understanding of how to access instructions and care plan via MyChart confirmed with patient.     No further follow up required: No needs   Raina Mina, RN Care Management Coordinator [place] Office [phone]

## 2022-06-06 NOTE — Telephone Encounter (Signed)
The patient has been notified of the result and verbalized understanding.  All questions (if any) were answered. Antonieta Iba, RN 06/06/2022 5:12 PM  Toprol XL has been sent in. Patient reports that her father had a lot of cardiac issues and he was unable to tolerate any beta-blockers. She states that she has never taken one but is willing to try it. She will call and let us know if she has any issues. Follow up appointment has been scheduled.

## 2022-06-06 NOTE — Telephone Encounter (Signed)
-----   Message from Precious Gilding, RN sent at 06/06/2022  1:44 PM EDT -----  ----- Message ----- From: Sueanne Margarita, MD Sent: 06/05/2022  11:37 AM EDT To: Leamon Arnt, MD; [facility] Triage  Heart monitor showed multiple episodes of a fast heart beat from the top of the heart called atrial tachycardia.  Continue Cardizem and start Toprol XL '25mg'$  daily.  Followup with PA in 4 weeks

## 2022-07-03 NOTE — Progress Notes (Unsigned)
Cardiology Office Note:    Date:  07/06/2022   ID:  Madison Clements, DOB March 09, 1946, MRN [id number]  PCP:  Leamon Arnt, MD   [facility] Providers Cardiologist:  Fransico Him, MD     Referring MD: Leamon Arnt, MD   Chief Complaint: SVT  History of Present Illness:    Madison Clements is a very pleasant 76 y.o. female with a hx of pulmonary hypertension (likely a combination of WHO group 2 pulmonary venous hypertension from diastolic dysfunction and Group 3 from OSA, nonobstructive CAD, HLD, HTN, OSA, palpitations, and estrogen receptor positive breast CA.   Echocardiogram in 2018 in [place] showed normal LV function with EF 55 to 19%, grade 2 diastolic dysfunction with mild to moderate TR and mild pulmonary hypertension with RVSP 39 mmHg.  Repeat 2D echo 1 year later was unchanged.  Nuclear stress test 2015 showed no ischemia.  Cardiac catheterization 2016 showed mild CAD with calcified vessels.  Home sleep study 07/2021 showed moderate OSA with AHI 18/h and O2 sats as low as 72%.  Reported frustration with CPAP and wanted to be evaluated for inspire device and was referred to ENT.  Felt to be a good candidate for inspire and was scheduled for a sleep induced endoscopy, unfortunately she was diagnosed with breast cancer in the interim and the endoscopy was postponed.  She underwent lumpectomy on 09/09/2021.  She was last seen in cardiology clinic on 05/11/2022 by Dr. Radford Pax at which time she reported resting heart rate 120s with sensation of palpitations at times.  Occasional LE edema at the end of the day.  Echocardiogram 03/2022 showed normalization of PA pressures with treatment of sleep apnea on CPAP, right ventricle now dilated, mild to moderate TR, LVEF 60-65%, G1DD. Cardiac monitor for evaluation of palpitations revealed multiple episodes of atrial tachycardia with longest lasting 6 minutes and 2 seconds.  She was advised to continue Cardizem and start Toprol  XL 25 mg daily.  Advised to follow-up in 4 weeks.  Today, she is here for follow-up of frequent SVT on monitor. She is accompanied by her daughter. Has been monitoring with smart watch with variations in HR from 50s up to 100s bpm with 1 episode of HR 140. This episode occurred at 0600 while she was sleeping. May have been what woke her up, otherwise she was asymptomatic. BP is well-controlled but varies greatly. She denies lightheadedness, presyncope, syncope. Symptoms of palpitations have improved since starting Toprol XL in addition to diltiazem (which she has taken for many years). Recent onset of difficulty sleeping which she never had in the past. Is removing CPAP after 3-4 hours due to restlessness whereas prior to starting metoprolol she slept 6-8 hours each night with her CPAP on. Also having pain in left arm she feels is 2/2 to carpal tunnel syndrome. Is having chest pressure and SOB, worse going up an incline and with exertion. Additionally, HR increases and she feels a different sensation in her throat. No n/v, diaphoresis. No orthopnea, PND, weakness, or edema. Also has increased fatigue, although she is uncertain whether this is 2/2 cancer treatment.   Past Medical History:  Diagnosis Date   Allergy 1990   Morphine following surgery   Arthritis    generalized   Atypical mole 02/24/2015   RGHT LATERAL THIGH MODERATE   Atypical mole 01/20/2022   Left Breast (mild)   Atypical mole 01/20/2022   Mid Back (mild)   Atypical nevi 02/24/2015  RIGHT NECK MILD   Atypical nevi 08/25/2015   LEFT UPPER ARM MILD   Atypical nevi 08/25/2015   LEFT LATERAL FOREARM MILD/FREE   Atypical nevi 02/23/2016   LEFT THIGH MODERATE/FREE   Atypical nevi 02/23/2016   LEFT MEDIAL LEG MODERATE/FREE   Atypical nevi 02/23/2016   LEFT UPPER BACK MILD /FREE   Atypical nevi 08/23/2016   RIGHT ANT PROX THIGH MILD /FREE   Atypical nevi 07/25/2018   MID LOWER BACK MODERATE   Atypical nevi 01/22/2019   LEFT  MID BACK MILD /FREE   Atypical nevi 01/22/2019   LEFT NECK MILD   Atypical nevi 08/06/2019   LEFT INNER BREAST MODERATE W/S   Atypical nevi 08/06/2019   LEFT OUTER SIDE MILD/FREE   Blood transfusion without reported diagnosis    hx of   Breast cancer ([place]) 2022   CAD (coronary artery disease), native coronary artery 02/07/2019   Minimal CAD at the time of cath in 2016 in [place]   Family history of breast cancer 08/19/2021   Family history of pancreatic cancer 08/19/2021   Fatty liver 05/21/2020   Gastric polyps 10/02/2018   EGD 03/2015; benign   GERD (gastroesophageal reflux disease) 10/02/2018   on meds   History of melanoma 10/02/2018   Abdomen 2012; 2013; s/p local excisions.    Hyperlipidemia    on meds   Hypertension    on meds   Melanoma ([place]) 2012   MM- central lower abdomen- (ECX) [place] [facility]   Osteopenia    Polyp of colon, adenomatous 10/02/2018   Colonoscopy 03/2015; recheck in 2021   PONV (postoperative nausea and vomiting)    Pulmonary hypertension, primary ([place]) 10/02/2018   SCCA (squamous cell carcinoma) of skin 09/18/2017   RIGHT ANT. DISTAL LOWER LEG TREATED BY DR. Delton Coombes   Sleep apnea    Sleep apnea 05/26/2020   uses CPAP    Past Surgical History:  Procedure Laterality Date   [place]   BREAST LUMPECTOMY WITH RADIOACTIVE SEED LOCALIZATION Left 09/09/2021   Procedure: LEFT BREAST LUMPECTOMY WITH RADIOACTIVE SEED LOCALIZATION X2;  Surgeon: Donnie Mesa, MD;  Location: [facility];  Service: General;  Laterality: Left;   COLONOSCOPY  2016   in [place]-hx of polyps   EYE SURGERY  2013 torn retina laser   MENISCUS REPAIR Right 2001   right knee   thumb surgery Left    TONSILLECTOMY AND ADENOIDECTOMY     tuabl ligation     TUBAL LIGATION  1975   WISDOM TOOTH EXTRACTION      Current Medications: Current Meds  Medication Sig   celecoxib  (CELEBREX) 100 MG capsule TAKE 1 CAPSULE EVERY DAY   Cholecalciferol (VITAMIN D3) 50 MCG (2000 UT) capsule Take 2,000 Units by mouth daily. Pt takes every other day   diclofenac Sodium (VOLTAREN) 1 % GEL    diltiazem (TIADYLT ER) 180 MG 24 hr capsule Take 1 capsule (180 mg total) by mouth daily.   folic acid (FOLVITE) 078 MCG tablet Take 400 mcg by mouth daily.   furosemide (LASIX) 40 MG tablet Take 1 tablet (40 mg total) daily. Take an extra 1/2 tablet (20 mg total) as needed. (Patient taking differently: 20 mg daily. Take 1 tablet (40 mg total) daily. Take an extra 1/2 tablet (20 mg total) as needed. Patient states that she is taking 20 mg currently)   lisinopril (ZESTRIL) 10  MG tablet TAKE 1 TABLET (10 MG TOTAL) BY MOUTH DAILY.   metoprolol tartrate (LOPRESSOR) 25 MG tablet Take on (1) tablet by mouth ( 25 mg) 2 hours prior to CT scan.   omeprazole (PRILOSEC) 20 MG capsule TAKE 1 CAPSULE EVERY DAY   Probiotic Product (ADVANCED PROBIOTIC PO) Take 1 capsule by mouth daily.   rosuvastatin (CRESTOR) 40 MG tablet TAKE 1 TABLET (40 MG TOTAL) BY MOUTH AT BEDTIME.   Specialty Vitamins Products (ONE-A-DAY BONE STRENGTH PO) Take 1 tablet by mouth daily.    [DISCONTINUED] metoprolol succinate (TOPROL XL) 25 MG 24 hr tablet Take 1 tablet (25 mg total) by mouth daily.     Allergies:   Morphine and Morphine and related   Social History   Socioeconomic History   Marital status: Widowed    Spouse name: Not on file   Number of children: 1   Years of education: Not on file   Highest education level: Not on file  Occupational History   Occupation: Retired [employer]  Tobacco Use   Smoking status: Former    Packs/day: 0.25    Years: 10.00    Total pack years: 2.50    Types: Cigarettes    Quit date: 09/12/1996    Years since quitting: 25.8   Smokeless tobacco: Never  Vaping Use   Vaping Use: Never used  Substance and Sexual Activity   Alcohol use: Yes    Alcohol/week: 10.0 standard drinks of alcohol     Types: 5 Glasses of wine, 5 Shots of liquor per week    Comment: occassional    Drug use: Never   Sexual activity: Not Currently    Birth control/protection: Abstinence, Post-menopausal    Comment: hysterectomy  Other Topics Concern   Not on file  Social History Narrative   Widowed 2018 after 19 years of marriage   One daughter, 2 grandchildren. Lives in [place]   Sister lives in same complex in [place]    Previously lived in [place], [place] Strain: [place]  (12/28/2021)   Overall Financial Resource Strain (CARDIA)    Difficulty of Paying Living Expenses: Not hard at all  Food Insecurity: No Food Insecurity (06/06/2022)   Hunger Vital Sign    Worried About Running Out of Food in the Last Year: Never true    Ran Out of Food in the Last Year: Never true  Transportation Needs: No Transportation Needs (06/06/2022)   PRAPARE - Hydrologist (Medical): No    Lack of Transportation (Non-Medical): No  Physical Activity: Insufficiently Active (12/28/2021)   Exercise Vital Sign    Days of Exercise per Week: 5 days    Minutes of Exercise per Session: 20 min  Stress: No Stress Concern Present (12/28/2021)   [place]    Feeling of Stress : Not at all  Social Connections: Moderately Integrated (12/28/2021)   Social Connection and Isolation Panel [NHANES]    Frequency of Communication with Friends and Family: More than three times a week    Frequency of Social Gatherings with Friends and Family: More than three times a week    Attends Religious Services: 1 to 4 times per year    Active Member of [employer] or Organizations: Yes    Attends Archivist Meetings: 1 to 4 times per year    Marital Status: Widowed     Family  History: The patient's family history includes Arthritis in her mother, paternal grandmother, and sister; Asthma in her  maternal grandfather and paternal grandfather; Breast cancer (age of onset: 14) in her sister; COPD in her paternal grandfather; Diabetes in her sister; Early death in her maternal grandmother; Heart attack in her father; Heart disease in her father, maternal uncle, maternal uncle, and paternal grandmother; High Cholesterol in her sister; High blood pressure in her father, maternal grandmother, mother, paternal grandmother, and sister; Hyperlipidemia in her mother; Hypertension in her father, maternal grandmother, mother, and paternal grandmother; Kidney disease in her maternal grandfather and paternal grandfather; Pancreatic cancer in her maternal aunt; Prostate cancer in her cousin; Stroke in her maternal grandmother, mother, and paternal aunt; Varicose Veins in her mother and sister. There is no history of Colon polyps, Colon cancer, Esophageal cancer, Rectal cancer, or Stomach cancer.  ROS:   Please see the history of present illness.    + palpitations + DOE/chest pressure  All other systems reviewed and are negative.  Labs/Other Studies Reviewed:    The following studies were reviewed today:  Echo 04/05/22   1. Left ventricular ejection fraction, by estimation, is 60 to 65%. The  left ventricle has normal function. The left ventricle has no regional  wall motion abnormalities. Left ventricular diastolic parameters are  consistent with Grade I diastolic  dysfunction (impaired relaxation). The average left ventricular global  longitudinal strain is -19.2 %. The global longitudinal strain is normal.   2. Right ventricular systolic function is normal. The right ventricular  size is dilated. There is normal pulmonary artery systolic pressure. The  estimated right ventricular systolic pressure is 70.0 mmHg.   3. The mitral valve is normal in structure.   4. Tricuspid valve regurgitation is mild to moderate and eccentric in  nature.   5. The aortic valve is tricuspid. There is mild  calcification of the  aortic valve. There is mild thickening of the aortic valve. Aortic valve  regurgitation is not visualized. No aortic stenosis is present.   6. The inferior vena cava is normal in size with greater than 50%  respiratory variability, suggesting right atrial pressure of 3 mmHg.   Comparison(s): RV has dilated from prior.   Cardiac monitor 06/05/22    Predominant rhythm was normal sinus rhythm with average heart rate 67bpm and ranged from 47 to 184bpm.   53 episodes of SVT with longest interval lasting 6 minutes and 2 seconds with fastest heart rate 184bpm.   Isolated PACs, atrial couplest and triplets   Rate PVC   Lexiscan Myoview 03/17/20  The left ventricular ejection fraction is hyperdynamic (>65%). Nuclear stress EF: 78%. There was no ST segment deviation noted during stress. No T wave inversion was noted during stress. The study is normal. This is a low risk study. No prior study for comparison.   Recent Labs: 07/09/2021: TSH 1.35 05/20/2022: ALT 22; BUN 18; Creatinine, Ser 1.01; Hemoglobin 14.3; Platelets 258; Potassium 3.8; Sodium 137  Recent Lipid Panel    Component Value Date/Time   CHOL 137 05/18/2022 1200   TRIG 155 (H) 05/18/2022 1200   HDL 48 05/18/2022 1200   CHOLHDL 2.9 05/18/2022 1200   CHOLHDL 3 07/09/2021 0935   VLDL 27.0 07/09/2021 0935   LDLCALC 63 05/18/2022 1200   LDLCALC 55 03/09/2021 0914   LDLDIRECT 68.0 01/14/2020 0904     Risk Assessment/Calculations:      Physical Exam:    VS:  BP 118/62   Pulse Marland Kitchen)  59   Ht 5' (1.524 m)   Wt 156 lb 9.6 oz (71 kg)   SpO2 98%   BMI 30.58 kg/m     Wt Readings from Last 3 Encounters:  07/06/22 156 lb 9.6 oz (71 kg)  05/20/22 152 lb 14.4 oz (69.4 kg)  05/11/22 153 lb 3.2 oz (69.5 kg)     GEN:  Well nourished, well developed in no acute distress HEENT: Normal NECK: No JVD; No carotid bruits CARDIAC: RRR, no murmurs, rubs, gallops RESPIRATORY:  Clear to auscultation without rales,  wheezing or rhonchi  ABDOMEN: Soft, non-tender, non-distended MUSCULOSKELETAL:  No edema; No deformity. 2+ pedal pulses, equal bilaterally SKIN: Warm and dry NEUROLOGIC:  Alert and oriented x 3 PSYCHIATRIC:  Normal affect   EKG:  EKG is not ordered today.    Diagnoses:    1. Coronary artery disease involving native coronary artery of native heart with unstable angina pectoris ([place])   2. Pulmonary hypertension, primary ([place])   3. Chest pressure   4. SVT (supraventricular tachycardia)   5. OSA (obstructive sleep apnea)   6. Palpitations   7. Hyperlipidemia LDL goal <70    Assessment and Plan:     CAD with unstable angina:  Minimal nonobstructive CAD on cath in 2016 in [place]. Nuclear stress test 03/2020 showed no ischemia. New onset of DOE and chest pressure that worsens with activity. No n/v, diaphoresis. Concern for worsening CAD. Symptoms may also be 2/2 frequent SVT although she feels this has improved on BB. We will get coronary CTA for mapping of coronary anatomy. Will have her take Lopressor 25 mg 2 hours prior to CT.   SVT: Cardiac monitor 06/05/2022 revealed 53 episodes of SVT with longest interval lasting 6 minutes 2 seconds. Symptoms of palpitations have improved since starting Toprol XL 25 mg in addition to diltiazem 180 mg. One episode per Apple watch of HR 140s recently.  We discussed treatment options and she would like to be referred to EP physician for discussion of possible SVT ablation.  We will continue CCB and BB at current doses due to low resting HR and occasional soft BP readings.  Pulmonary hypertension: Mildly dilated RV with normal pulmonary artery systolic pressure, RVSP 29 mmHg, mild to moderate TR. Recommendation to repeat in 6 months by Dr. Radford Pax.  As noted below, recent reduction and time she is able to wear CPAP. She is going to try taking metoprolol at night to see if this helps her sleep. Having DOE and chest pressure. No orthopnea, edema, or PND.  Will get CTA for ischemia evaluation and see her back in soon follow-up.   OSA: Moderate OSA with AHI 17.9/hr on HST and CPAP. Referral placed to Dr. Wilburn Cornelia tor sleep endoscopy for [facility] device. She would like to postpone further evaluation for Inspire device at this time due to concern of undergoing anesthesia.  Recent onset difficulty sleeping more than 3 to 4 hours with CPAP whereas she previously tolderated machine for 6-8 hours/night. She is going to try taking Toprol XL at night  to see if this helps her sleep. Will forward notes to Dr. Radford Pax for her awareness and for possible CPAP titration.   Hyperlipidemia LDL goal < 70: LDL 63 on 05/18/2022. Continue rosuvastatin.   Disposition: 2 months with me  Medication Adjustments/Labs and Tests Ordered: Current medicines are reviewed at length with the patient today.  Concerns regarding medicines are outlined above.  Orders Placed This Encounter  Procedures   CT  CORONARY MORPH W/CTA COR W/SCORE W/CA W/CM &/OR WO/CM   Basic Metabolic Panel (BMET)   Ambulatory referral to Cardiac Electrophysiology   Meds ordered this encounter  Medications   metoprolol tartrate (LOPRESSOR) 25 MG tablet    Sig: Take on (1) tablet by mouth ( 25 mg) 2 hours prior to CT scan.    Dispense:  1 tablet    Refill:  0   metoprolol succinate (TOPROL XL) 25 MG 24 hr tablet    Sig: Take 1 tablet (25 mg total) by mouth at bedtime.    Dispense:  30 tablet    Refill:  11    Patient Instructions  Medication Instructions:   CHANGE Toprol one (1) tablet by mouth (25 mg) at nightly.   *If you need a refill on your cardiac medications before your next appointment, please call your pharmacy*  Lab Work:  TODAY !!!! BMET  If you have labs (blood work) drawn today and your tests are completely normal, you will receive your results only by: [place] (if you have MyChart) OR A paper copy in the mail If you have any lab test that is abnormal or we need to  change your treatment, we will call you to review the results.   Testing/Procedures:   Your cardiac CT will be scheduled at one of the below locations:   [facility] [address] [place], [place] [address] [phone]  If scheduled at [facility], please arrive at the [facility] and Children's Entrance (Entrance C2) of [facility] 30 minutes prior to test start time. You can use the FREE valet parking offered at entrance C (encouraged to control the heart rate for the test)  Proceed to the [facility] Radiology Department (first floor) to check-in and test prep.  All radiology patients and guests should use entrance C2 at [facility], accessed from [facility], even though the hospital's physical address listed is [address].    Please follow these instructions carefully (unless otherwise directed):  On the Night Before the Test: Be sure to Drink plenty of water. Do not consume any caffeinated/decaffeinated beverages or chocolate 12 hours prior to your test. Do not take any antihistamines 12 hours prior to your test.  On the Day of the Test: Drink plenty of water until 1 hour prior to the test. Do not eat any food 1 hour prior to test. You may take your regular medications prior to the test.  Take metoprolol (Lopressor) one tablet ( 25 mg)  two hours prior to test. HOLD Furosemide morning of the test. FEMALES- please wear underwire-free bra if available, avoid dresses & tight clothing     After the Test: Drink plenty of water. After receiving IV contrast, you may experience a mild flushed feeling. This is normal. On occasion, you may experience a mild rash up to 24 hours after the test. This is not dangerous. If this occurs, you can take Benadryl 25 mg and increase your fluid intake. If you experience trouble breathing, this can be serious. If it is severe call 911 IMMEDIATELY. If it is mild, please call our  office.  We will call to schedule your test 2-4 weeks out understanding that some insurance companies will need an authorization prior to the service being performed.   For non-scheduling related questions, please contact the cardiac imaging nurse navigator should you have any questions/concerns: Marchia Bond, Cardiac Imaging Nurse Navigator Gordy Clement, Cardiac Imaging Nurse Navigator  [facility] Direct Office Dial: [phone]   For scheduling needs, including cancellations and rescheduling, please call [place], [phone].    Follow-Up: At [facility], you and your health needs are our priority.  As part of our continuing mission to provide you with exceptional heart care, we have created designated Provider Care Teams.  These Care Teams include your primary Cardiologist (physician) and Advanced Practice Providers (APPs -  Physician Assistants and Nurse Practitioners) who all work together to provide you with the care you need, when you need it.  We recommend signing up for the patient portal called "MyChart".  Sign up information is provided on this After Visit Summary.  MyChart is used to connect with patients for Virtual Visits (Telemedicine).  Patients are able to view lab/test results, encounter notes, upcoming appointments, etc.  Non-urgent messages can be sent to your provider as well.   To learn more about what you can do with MyChart, go to [email or web address].    Your next appointment:   2 month(s)  The format for your next appointment:   In Person  Provider:   Christen Bame, NP         Other Instructions  You have been referred to EP department.  The office will call you to schedule appointment to discuss SVT ablation.      Important Information About Sugar         Signed, Emmaline Life, NP  07/06/2022 10:44 AM    [place]

## 2022-07-06 ENCOUNTER — Ambulatory Visit: Payer: Medicare Other | Attending: Nurse Practitioner | Admitting: Nurse Practitioner

## 2022-07-06 ENCOUNTER — Telehealth: Payer: Self-pay | Admitting: Cardiology

## 2022-07-06 ENCOUNTER — Encounter: Payer: Self-pay | Admitting: Nurse Practitioner

## 2022-07-06 VITALS — BP 118/62 | HR 59 | Ht 60.0 in | Wt 156.6 lb

## 2022-07-06 DIAGNOSIS — E785 Hyperlipidemia, unspecified: Secondary | ICD-10-CM | POA: Insufficient documentation

## 2022-07-06 DIAGNOSIS — R0789 Other chest pain: Secondary | ICD-10-CM | POA: Insufficient documentation

## 2022-07-06 DIAGNOSIS — G4733 Obstructive sleep apnea (adult) (pediatric): Secondary | ICD-10-CM | POA: Insufficient documentation

## 2022-07-06 DIAGNOSIS — I270 Primary pulmonary hypertension: Secondary | ICD-10-CM | POA: Diagnosis not present

## 2022-07-06 DIAGNOSIS — I25110 Atherosclerotic heart disease of native coronary artery with unstable angina pectoris: Secondary | ICD-10-CM | POA: Diagnosis not present

## 2022-07-06 DIAGNOSIS — R002 Palpitations: Secondary | ICD-10-CM | POA: Insufficient documentation

## 2022-07-06 DIAGNOSIS — I471 Supraventricular tachycardia, unspecified: Secondary | ICD-10-CM | POA: Diagnosis not present

## 2022-07-06 DIAGNOSIS — I2510 Atherosclerotic heart disease of native coronary artery without angina pectoris: Secondary | ICD-10-CM | POA: Diagnosis not present

## 2022-07-06 MED ORDER — METOPROLOL TARTRATE 25 MG PO TABS: ORAL_TABLET | ORAL | 0 refills | Status: DC

## 2022-07-06 MED ORDER — METOPROLOL SUCCINATE ER 25 MG PO TB24: 25.0000 mg | ORAL_TABLET | Freq: Every day | ORAL | 11 refills | Status: DC

## 2022-07-06 NOTE — Telephone Encounter (Signed)
Pt c/o medication issue:  1. Name of Medication: metoprolol tartrate (LOPRESSOR) 25 MG tablet  metoprolol succinate (TOPROL XL) 25 MG 24 hr tablet  2. How are you currently taking this medication (dosage and times per day)?   3. Are you having a reaction (difficulty breathing--STAT)?   4. What is your medication issue? Pharmacy is calling to get clarification on if patient is to also take their regular medication of  metoprolol succinate the same days as the tartrate for the CT scan. Requesting call back.

## 2022-07-06 NOTE — Patient Instructions (Addendum)
Medication Instructions:   CHANGE Toprol one (1) tablet by mouth (25 mg) at nightly.   *If you need a refill on your cardiac medications before your next appointment, please call your pharmacy*  Lab Work:  TODAY !!!! BMET  If you have labs (blood work) drawn today and your tests are completely normal, you will receive your results only by: [place] (if you have MyChart) OR A paper copy in the mail If you have any lab test that is abnormal or we need to change your treatment, we will call you to review the results.   Testing/Procedures:   Your cardiac CT will be scheduled at one of the below locations:   [facility] [address] [place], [place] [address] [phone]  If scheduled at [facility], please arrive at the [facility] Entrance (Entrance C2) of [facility] 30 minutes prior to test start time. You can use the FREE valet parking offered at entrance C (encouraged to control the heart rate for the test)  Proceed to the [facility] Radiology Department (first floor) to check-in and test prep.  All radiology patients and guests should use entrance C2 at [facility], accessed from [facility], even though the hospital's physical address listed is [address]    Please follow these instructions carefully (unless otherwise directed):  On the Night Before the Test: Be sure to Drink plenty of water. Do not consume any caffeinated/decaffeinated beverages or chocolate 12 hours prior to your test. Do not take any antihistamines 12 hours prior to your test.  On the Day of the Test: Drink plenty of water until 1 hour prior to the test. Do not eat any food 1 hour prior to test. You may take your regular medications prior to the test.  Take metoprolol (Lopressor) one tablet ( 25 mg)  two hours prior to test. HOLD Furosemide morning of the test. FEMALES- please wear underwire-free bra if available,  avoid dresses & tight clothing     After the Test: Drink plenty of water. After receiving IV contrast, you may experience a mild flushed feeling. This is normal. On occasion, you may experience a mild rash up to 24 hours after the test. This is not dangerous. If this occurs, you can take Benadryl 25 mg and increase your fluid intake. If you experience trouble breathing, this can be serious. If it is severe call 911 IMMEDIATELY. If it is mild, please call our office.  We will call to schedule your test 2-4 weeks out understanding that some insurance companies will need an authorization prior to the service being performed.   For non-scheduling related questions, please contact the cardiac imaging nurse navigator should you have any questions/concerns: Marchia Bond, Cardiac Imaging Nurse Navigator Gordy Clement, Cardiac Imaging Nurse Navigator [facility] Direct Office Dial: [phone]   For scheduling needs, including cancellations and rescheduling, please call [place], [phone].    Follow-Up: At [facility], you and your health needs are our priority.  As part of our continuing mission to provide you with exceptional heart care, we have created designated Provider Care Teams.  These Care Teams include your primary Cardiologist (physician) and Advanced Practice Providers (APPs -  Physician Assistants and Nurse Practitioners) who all work together to provide you with the care you need, when you need it.  We recommend signing up for the patient portal called "MyChart".  Sign up information is provided on  this After Visit Summary.  MyChart is used to connect with patients for Virtual Visits (Telemedicine).  Patients are able to view lab/test results, encounter notes, upcoming appointments, etc.  Non-urgent messages can be sent to your provider as well.   To learn more about what you can do with MyChart, go to [email or web address].    Your next  appointment:   2 month(s)  The format for your next appointment:   In Person  Provider:   Christen Bame, NP         Other Instructions  You have been referred to EP department.  The office will call you to schedule appointment to discuss SVT ablation.      Important Information About Sugar

## 2022-07-06 NOTE — Telephone Encounter (Signed)
Called pharmacy to let them know metoprolol tartrate is to be given 2 hours prior to CT, and metoprolol succinate patient will start taking it at bedtime. Pharmacy is aware on how patient is suppose to take medication. Patient was advised at office visit today on how to take medication.

## 2022-07-07 LAB — BASIC METABOLIC PANEL
BUN/Creatinine Ratio: 16 (ref 12–28)
BUN: 16 mg/dL (ref 8–27)
CO2: 24 mmol/L (ref 20–29)
Calcium: 10 mg/dL (ref 8.7–10.3)
Chloride: 100 mmol/L (ref 96–106)
Creatinine, Ser: 1 mg/dL (ref 0.57–1.00)
Glucose: 111 mg/dL — ABNORMAL HIGH (ref 70–99)
Potassium: 3.8 mmol/L (ref 3.5–5.2)
Sodium: 139 mmol/L (ref 134–144)
eGFR: 58 mL/min/{1.73_m2} — ABNORMAL LOW (ref >59)

## 2022-07-11 ENCOUNTER — Ambulatory Visit: Payer: Medicare Other | Attending: Internal Medicine | Admitting: Internal Medicine

## 2022-07-11 ENCOUNTER — Encounter: Payer: Self-pay | Admitting: Internal Medicine

## 2022-07-11 DIAGNOSIS — I471 Supraventricular tachycardia, unspecified: Secondary | ICD-10-CM | POA: Insufficient documentation

## 2022-07-11 DIAGNOSIS — I10 Essential (primary) hypertension: Secondary | ICD-10-CM | POA: Diagnosis not present

## 2022-07-11 MED ORDER — DILTIAZEM HCL ER COATED BEADS 240 MG PO CP24: 240.0000 mg | ORAL_CAPSULE | Freq: Every day | ORAL | 3 refills | Status: DC

## 2022-07-11 NOTE — Patient Instructions (Addendum)
Medication Instructions:  Your physician has recommended you make the following change in your medication: STOP TAKING TOPROL XL: TODAY 07/11/22   MEDICATION INCREASE:  DILTIAZEM  ( 240 MG )  YOU WILL TAKE ONE TABLET ( 240 MG ) BY MOUTH ONCE A DAY.    Lab Work: None ordered.  If you have labs (blood work) drawn today and your tests are completely normal, you will receive your results only by: [place] (if you have MyChart) OR A paper copy in the mail If you have any lab test that is abnormal or we need to change your treatment, we will call you to review the results.  Testing/Procedures: None ordered.  Follow-Up: At [facility], you and your health needs are our priority.  As part of our continuing mission to provide you with exceptional heart care, we have created designated Provider Care Teams.  These Care Teams include your primary Cardiologist (physician) and Advanced Practice Providers (APPs -  Physician Assistants and Nurse Practitioners) who all work together to provide you with the care you need, when you need it.  We recommend signing up for the patient portal called "MyChart".  Sign up information is provided on this After Visit Summary.  MyChart is used to connect with patients for Virtual Visits (Telemedicine).  Patients are able to view lab/test results, encounter notes, upcoming appointments, etc.  Non-urgent messages can be sent to your provider as well.   To learn more about what you can do with MyChart, go to [email or web address].    Your next appointment:   6 Months with Dr. Cristopher Peru   The format for your next appointment:   In Person  Provider:   Cristopher Peru, MD{or one of the following Advanced Practice Providers on your designated Care Team:   Tommye Standard, [place] Legrand Como "Jonni Sanger" Chalmers Cater, [place]   Important Information About Sugar

## 2022-07-11 NOTE — Progress Notes (Signed)
HPI Madison Clements is referred by Christen Bame for evaluation of SVT. She is a pleasant 76 yo woman with HTN, non-obstructive CAD, mild/mod pulmonary HTN and breast CA. She has occaisional palpitations. She has worn a cardiac monitor which has demonstrated NS AT, PAC's and PVC's. No pauses. She denies syncope and her palpitations are not sustained. She was placed on low dose toprol in addition to the cardizem. Since then she may have had some improvement in her symptoms of palpitations but notes worsening fatigue.  Allergies  Allergen Reactions   Morphine Rash and Nausea And Vomiting   Morphine And Related Hives, Dermatitis and Rash     Current Outpatient Medications  Medication Sig Dispense Refill   celecoxib (CELEBREX) 100 MG capsule TAKE 1 CAPSULE EVERY DAY 90 capsule 3   Cholecalciferol (VITAMIN D3) 50 MCG (2000 UT) capsule Take 2,000 Units by mouth daily. Pt takes every other day     diclofenac Sodium (VOLTAREN) 1 % GEL      diltiazem (TIADYLT ER) 180 MG 24 hr capsule Take 1 capsule (180 mg total) by mouth daily. 90 capsule 0   folic acid (FOLVITE) 102 MCG tablet Take 400 mcg by mouth daily.     furosemide (LASIX) 40 MG tablet Take 1 tablet (40 mg total) daily. Take an extra 1/2 tablet (20 mg total) as needed. (Patient taking differently: 20 mg daily. Take 1 tablet (40 mg total) daily. Take an extra 1/2 tablet (20 mg total) as needed. Patient states that she is taking 20 mg currently) 135 tablet 3   lisinopril (ZESTRIL) 10 MG tablet TAKE 1 TABLET (10 MG TOTAL) BY MOUTH DAILY. 90 tablet 0   metoprolol succinate (TOPROL XL) 25 MG 24 hr tablet Take 1 tablet (25 mg total) by mouth at bedtime. 30 tablet 11   metoprolol tartrate (LOPRESSOR) 25 MG tablet Take on (1) tablet by mouth ( 25 mg) 2 hours prior to CT scan. 1 tablet 0   omeprazole (PRILOSEC) 20 MG capsule TAKE 1 CAPSULE EVERY DAY 90 capsule 3   Probiotic Product (ADVANCED PROBIOTIC PO) Take 1 capsule by mouth daily.      rosuvastatin (CRESTOR) 40 MG tablet TAKE 1 TABLET (40 MG TOTAL) BY MOUTH AT BEDTIME. 90 tablet 0   Specialty Vitamins Products (ONE-A-DAY BONE STRENGTH PO) Take 1 tablet by mouth daily.      No current facility-administered medications for this visit.     Past Medical History:  Diagnosis Date   Allergy 1990   Morphine following surgery   Arthritis    generalized   Atypical mole 02/24/2015   RGHT LATERAL THIGH MODERATE   Atypical mole 01/20/2022   Left Breast (mild)   Atypical mole 01/20/2022   Mid Back (mild)   Atypical nevi 02/24/2015   RIGHT NECK MILD   Atypical nevi 08/25/2015   LEFT UPPER ARM MILD   Atypical nevi 08/25/2015   LEFT LATERAL FOREARM MILD/FREE   Atypical nevi 02/23/2016   LEFT THIGH MODERATE/FREE   Atypical nevi 02/23/2016   LEFT MEDIAL LEG MODERATE/FREE   Atypical nevi 02/23/2016   LEFT UPPER BACK MILD /FREE   Atypical nevi 08/23/2016   RIGHT ANT PROX THIGH MILD /FREE   Atypical nevi 07/25/2018   MID LOWER BACK MODERATE   Atypical nevi 01/22/2019   LEFT MID BACK MILD /FREE   Atypical nevi 01/22/2019   LEFT NECK MILD   Atypical nevi 08/06/2019   LEFT INNER BREAST MODERATE W/S  Atypical nevi 08/06/2019   LEFT OUTER SIDE MILD/FREE   Blood transfusion without reported diagnosis    hx of   Breast cancer ([place]) 2022   CAD (coronary artery disease), native coronary artery 02/07/2019   Minimal CAD at the time of cath [facility]   Family history of breast cancer 08/19/2021   Family history of pancreatic cancer 08/19/2021   Fatty liver 05/21/2020   Gastric polyps 10/02/2018   EGD 03/2015; benign   GERD (gastroesophageal reflux disease) 10/02/2018   on meds   History of melanoma 10/02/2018   Abdomen 2012; 2013; s/p local excisions.    Hyperlipidemia    on meds   Hypertension    on meds   Melanoma ([place]) 2012   MM- central lower abdomen- (ECX) [facility]   Osteopenia    Polyp of colon, adenomatous 10/02/2018    Colonoscopy 03/2015; recheck in 2021   PONV (postoperative nausea and vomiting)    Pulmonary hypertension, primary ([place]) 10/02/2018   SCCA (squamous cell carcinoma) of skin 09/18/2017   RIGHT ANT. DISTAL LOWER LEG TREATED BY DR. Delton Coombes   Sleep apnea    Sleep apnea 05/26/2020   uses CPAP    ROS:   All systems reviewed and negative except as noted in the HPI.   Past Surgical History:  Procedure Laterality Date   ABDOMINAL HYSTERECTOMY  [place]   BREAST LUMPECTOMY WITH RADIOACTIVE SEED LOCALIZATION Left 09/09/2021   Procedure: LEFT BREAST LUMPECTOMY WITH RADIOACTIVE SEED LOCALIZATION X2;  Surgeon: Donnie Mesa, MD;  Location: [place];  Service: General;  Laterality: Left;   COLONOSCOPY  2016   in [place]-hx of polyps   EYE SURGERY  2013 torn retina laser   MENISCUS REPAIR Right 2001   right knee   thumb surgery Left    TONSILLECTOMY AND ADENOIDECTOMY     tuabl ligation     TUBAL LIGATION  1975   WISDOM TOOTH EXTRACTION       Family History  Problem Relation Age of Onset   Arthritis Mother    High blood pressure Mother    Hyperlipidemia Mother    Hypertension Mother    Stroke Mother    Varicose Veins Mother    Heart attack Father    Heart disease Father    High blood pressure Father    Hypertension Father    Arthritis Sister    Diabetes Sister    High Cholesterol Sister    High blood pressure Sister    Breast cancer Sister 81   Varicose Veins Sister    Pancreatic cancer Maternal Aunt        dx early 47s   Heart disease Maternal Uncle    Heart disease Maternal Uncle    Stroke Paternal Aunt    Early death Maternal Grandmother        Stroke at 72   High blood pressure Maternal Grandmother    Hypertension Maternal Grandmother    Stroke Maternal Grandmother    Asthma Maternal Grandfather    Kidney disease Maternal Grandfather    Arthritis Paternal Grandmother    Heart disease Paternal Grandmother     High blood pressure Paternal Grandmother    Hypertension Paternal Grandmother    Kidney disease Paternal Grandfather    Asthma Paternal Grandfather    COPD Paternal Grandfather    Prostate cancer Cousin        paternal  cousins x2; dx after 28   Colon polyps Neg Hx    Colon cancer Neg Hx    Esophageal cancer Neg Hx    Rectal cancer Neg Hx    Stomach cancer Neg Hx      Social History   Socioeconomic History   Marital status: Widowed    Spouse name: Not on file   Number of children: 1   Years of education: Not on file   Highest education level: Not on file  Occupational History   Occupation: Retired [employer]  Tobacco Use   Smoking status: Former    Packs/day: 0.25    Years: 10.00    Total pack years: 2.50    Types: Cigarettes    Quit date: 09/12/1996    Years since quitting: 25.8   Smokeless tobacco: Never  Vaping Use   Vaping Use: Never used  Substance and Sexual Activity   Alcohol use: Yes    Alcohol/week: 10.0 standard drinks of alcohol    Types: 5 Glasses of wine, 5 Shots of liquor per week    Comment: occassional    Drug use: Never   Sexual activity: Not Currently    Birth control/protection: Abstinence, Post-menopausal    Comment: hysterectomy  Other Topics Concern   Not on file  Social History Narrative   Widowed 2018 after 64 years of marriage   One daughter, 2 grandchildren. Lives in [place]   Sister lives in same complex in [place]    Previously lived in [place], [place] Strain: [place]  (12/28/2021)   Overall Financial Resource Strain (CARDIA)    Difficulty of Paying Living Expenses: Not hard at all  Food Insecurity: No Food Insecurity (06/06/2022)   Hunger Vital Sign    Worried About Running Out of Food in the Last Year: Never true    Ran Out of Food in the Last Year: Never true  Transportation Needs: No Transportation Needs (06/06/2022)   PRAPARE - Hydrologist  (Medical): No    Lack of Transportation (Non-Medical): No  Physical Activity: Insufficiently Active (12/28/2021)   Exercise Vital Sign    Days of Exercise per Week: 5 days    Minutes of Exercise per Session: 20 min  Stress: No Stress Concern Present (12/28/2021)   [place]    Feeling of Stress : Not at all  Social Connections: Moderately Integrated (12/28/2021)   Social Connection and Isolation Panel [NHANES]    Frequency of Communication with Friends and Family: More than three times a week    Frequency of Social Gatherings with Friends and Family: More than three times a week    Attends Religious Services: 1 to 4 times per year    Active Member of [employer] or Organizations: Yes    Attends Archivist Meetings: 1 to 4 times per year    Marital Status: Widowed  Intimate Partner Violence: Not At Risk (12/28/2021)   Humiliation, Afraid, Rape, and Kick questionnaire    Fear of Current or Ex-Partner: No    Emotionally Abused: No    Physically Abused: No    Sexually Abused: No     BP 124/70   Pulse (!) 57   Ht 5' (1.524 m)   Wt 153 lb (69.4 kg)   BMI 29.88 kg/m   Physical Exam:  Well appearing NAD HEENT: Unremarkable Neck:  No JVD, no thyromegally  Lymphatics:  No adenopathy Back:  No CVA tenderness Lungs:  Clear with no wheezes HEART:  Regular rate rhythm, no murmurs, no rubs, no clicks Abd:  soft, positive bowel sounds, no organomegally, no rebound, no guarding Ext:  2 plus pulses, no edema, no cyanosis, no clubbing Skin:  No rashes no nodules Neuro:  CN II through XII intact, motor grossly intact  EKG - sinus brady with low voltage.  Assess/Plan:  AT - I discussed the benign nature of her arrhythmias at least based on her symptoms and her heart monitor. I have recommended uptitration of her cardizem. Fatigue - this was worsened by metoprolol and I asked her to stop. HTN - her bp is controlled. We  will follow. CAD - she denies any anginal symptoms. No other change.   Carleene Overlie Gaylin Osoria,MD

## 2022-07-12 ENCOUNTER — Ambulatory Visit (INDEPENDENT_AMBULATORY_CARE_PROVIDER_SITE_OTHER): Payer: Medicare Other | Admitting: Family Medicine

## 2022-07-12 ENCOUNTER — Encounter: Payer: Self-pay | Admitting: Family Medicine

## 2022-07-12 VITALS — BP 138/60 | HR 61 | Temp 97.8°F | Ht 60.0 in | Wt 153.8 lb

## 2022-07-12 DIAGNOSIS — M810 Age-related osteoporosis without current pathological fracture: Secondary | ICD-10-CM

## 2022-07-12 DIAGNOSIS — E782 Mixed hyperlipidemia: Secondary | ICD-10-CM

## 2022-07-12 DIAGNOSIS — L2082 Flexural eczema: Secondary | ICD-10-CM

## 2022-07-12 DIAGNOSIS — I10 Essential (primary) hypertension: Secondary | ICD-10-CM | POA: Diagnosis not present

## 2022-07-12 DIAGNOSIS — C50812 Malignant neoplasm of overlapping sites of left female breast: Secondary | ICD-10-CM | POA: Diagnosis not present

## 2022-07-12 DIAGNOSIS — G4733 Obstructive sleep apnea (adult) (pediatric): Secondary | ICD-10-CM

## 2022-07-12 DIAGNOSIS — I471 Supraventricular tachycardia, unspecified: Secondary | ICD-10-CM | POA: Diagnosis not present

## 2022-07-12 DIAGNOSIS — Z170 Estrogen receptor positive status [ER+]: Secondary | ICD-10-CM | POA: Diagnosis not present

## 2022-07-12 DIAGNOSIS — I270 Primary pulmonary hypertension: Secondary | ICD-10-CM

## 2022-07-12 MED ORDER — ROSUVASTATIN CALCIUM 40 MG PO TABS: 40.0000 mg | ORAL_TABLET | Freq: Every day | ORAL | 3 refills | Status: DC

## 2022-07-12 MED ORDER — LISINOPRIL 10 MG PO TABS: 10.0000 mg | ORAL_TABLET | Freq: Every day | ORAL | 3 refills | Status: DC

## 2022-07-12 MED ORDER — TRIAMCINOLONE ACETONIDE 0.1 % EX CREA: 1.0000 | TOPICAL_CREAM | Freq: Two times a day (BID) | CUTANEOUS | 1 refills | Status: DC

## 2022-07-12 NOTE — Progress Notes (Signed)
Subjective  Chief Complaint  Patient presents with   Annual Exam    Pt here for Annual exam and is currently fasting     HPI: Madison Clements is a 76 y.o. female who presents to [facility] today for a Female Wellness Visit. She also has the concerns and/or needs as listed above in the chief complaint. These will be addressed in addition to the Health Maintenance Visit.   Wellness Visit: annual visit with health maintenance review and exam without Pap  HM: 76 and doing well overall but had difficult year: breast cancer left s/p lumpectomy and radiation. See below. New onset SVT, see below. Screens are current. Chronic disease f/u and/or acute problem visit: (deemed necessary to be done in addition to the wellness visit): Breast cancer. Tolerated treatment well and now emotionally better. However did not tolerate tamoxifen nor arimidex due to constipation and sleep problems. Took both for a month. Meets with oncology again in July. I revewed oncology studies and notes.  Pulm HTN and osa better on cpap Osteoporosis, forearm: reviewed recent dex resuls;worsening. Discussed options of care.  HLD on zetia. Statin intoleratnt.  HTN: cardizem and well controlled. Dose increased to 240 given svt sxs. To start today. Did not tolerate toprol xl C/o red dry patches on neck and inner left arm x weeks. No hives or vesicles or systemic sxs.   Assessment  1. Essential hypertension   2. Mixed hyperlipidemia   3. Pulmonary hypertension, primary ([place])   4. OSA (obstructive sleep apnea)   5. Osteoporosis of forearm   6. Malignant neoplasm of overlapping sites of left breast in female, estrogen receptor positive ([place])   7. SVT (supraventricular tachycardia)   8. Flexural eczema      Plan  Female Wellness Visit: Age appropriate Health Maintenance and Prevention measures were discussed with patient. Included topics are cancer screening recommendations, ways to keep  healthy (see AVS) including dietary and exercise recommendations, regular eye and dental care, use of seat belts, and avoidance of moderate alcohol use and tobacco use.  BMI: discussed patient's BMI and encouraged positive lifestyle modifications to help get to or maintain a target BMI. HM needs and immunizations were addressed and ordered. See below for orders. See HM and immunization section for updates. Routine labs and screening tests ordered including cmp, cbc and lipids where appropriate. Discussed recommendations regarding Vit D and calcium supplementation (see AVS)  Chronic disease management visit and/or acute problem visit: HLD On zetia; stable reviewed labs HTN is controlled. Cardizem 240 Osteoporosis: rec fosamax, now or can defer until other medical problems stablizes. Education givn Contineu cpap Start tac cream for eczema.  Svt on CCB Breast cancer: discussed need for aromatase inhibitor: will reconsider another trial and treatment for constipation. I spent a total of 48 minutes for this patient encounter. Time spent included preparation, face-to-face counseling with the patient and coordination of care, review of chart and records, and documentation of the encounter.  Follow up: 12 mo for cpe  No orders of the defined types were placed in this encounter.  Meds ordered this encounter  Medications   rosuvastatin (CRESTOR) 40 MG tablet    Sig: Take 1 tablet (40 mg total) by mouth at bedtime.    Dispense:  90 tablet    Refill:  3   lisinopril (ZESTRIL) 10 MG tablet    Sig: Take 1 tablet (10 mg total) by mouth daily.    Dispense:  90 tablet  Refill:  3   triamcinolone cream (KENALOG) 0.1 %    Sig: Apply 1 Application topically 2 (two) times daily. For 2 weeks, then as needed    Dispense:  45 g    Refill:  1      Body mass index is 30.04 kg/m. Wt Readings from Last 3 Encounters:  07/12/22 153 lb 12.8 oz (69.8 kg)  07/11/22 153 lb (69.4 kg)  07/06/22 156 lb 9.6 oz  (71 kg)     Patient Active Problem List   Diagnosis Date Noted   OSA (obstructive sleep apnea) 05/26/2020    Priority: High   Coronary artery disease involving native coronary artery of native heart without angina pectoris 02/07/2019    Priority: High    Minimal CAD at the time of cath in 2016 in [place] hypertension 10/02/2018    Priority: High   Mixed hyperlipidemia 10/02/2018    Priority: High   Pulmonary hypertension, primary ([place]) 10/02/2018    Priority: High    Diagnosed around 2010; has seen pulm and cards in the past.  Left heart catheterization in 2016 - minimal vascular disease Echocardiogram 02/2019 with worsening pulm htn: moderate. Mild diastolic dysfunction. Nl EF 2D echo 04/2022 showed normal heart function with mildly dilated RV, mild to moderate TR and mildly calcified AV.  No Pulmonary HTN but RV now enlarged - repeat echo in 6 months for pulmnoary HTN      History of melanoma 10/02/2018    Priority: High    Abdomen 2012; 2013; s/p local excisions.     Polyp of colon, adenomatous 10/02/2018    Priority: High    Colonoscopy 03/2015 and 2021    Hepatic steatosis 07/28/2020    Priority: Medium    GERD (gastroesophageal reflux disease) 10/02/2018    Priority: Medium    Osteoporosis of forearm 10/02/2018    Priority: Medium     Dexa 05/2022 t = -3.7 at forearm, -2.1 at hip; offered fosamax. Significant decreases. dexa 11/3019  t = -1.5 lowest, stable osteopenia. Repeat in 2 years.  Dexa 2019: T = -1.4 lowest; recheck 2 years.     Primary osteoarthritis involving multiple joints 10/02/2018    Priority: Medium     Daily celebrex; bilateral knees, h/o right meniscal repair; has had viscosupplementation and steroid injections in the past.  Bilateral hands,no back problems    Gastric polyps 10/02/2018    Priority: Medium     EGD 03/2015; benign; done for dysphagia    SVT (supraventricular tachycardia) 07/11/2022   Genetic testing  08/30/2021    Negative hereditary cancer genetic testing: no pathogenic variants detected in [facility] BRCAPlus Panel and CancerNext-Expanded +RNAinsight Panel.  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  The report dates are 08/27/2021 and 08/30/2021.   The BRCAplus panel offered by [facility] and includes sequencing and deletion/duplication analysis for the following 8 genes: ATM, BRCA1, BRCA2, CDH1, CHEK2, PALB2, PTEN, and TP53.  The CancerNext-Expanded gene panel offered by [facility] and includes sequencing, rearrangement, and RNA analysis for the following 77 genes: AIP, ALK, APC, ATM, AXIN2, BAP1, BARD1, BLM, BMPR1A, BRCA1, BRCA2, BRIP1, CDC73, CDH1, CDK4, CDKN1B, CDKN2A, CHEK2, CTNNA1, DICER1, FANCC, FH, FLCN, GALNT12, KIF1B, LZTR1, MAX, MEN1, MET, MLH1, MSH2, MSH3, MSH6, MUTYH, NBN, NF1, NF2, NTHL1, PALB2, PHOX2B, PMS2, POT1, PRKAR1A, PTCH1, PTEN, RAD51C, RAD51D, RB1, RECQL, RET, SDHA, SDHAF2, SDHB, SDHC, SDHD, SMAD4, SMARCA4, SMARCB1, SMARCE1, STK11, SUFU, TMEM127, TP53, TSC1, TSC2, VHL  and XRCC2 (sequencing and deletion/duplication); EGFR, EGLN1, HOXB13, KIT, MITF, PDGFRA, POLD1, and POLE (sequencing only); EPCAM and GREM1 (deletion/duplication only).      Family history of breast cancer 08/19/2021   Family history of pancreatic cancer 08/19/2021   Malignant neoplasm of overlapping sites of left breast in female, estrogen receptor positive ([place]) 08/16/2021   Health Maintenance  Topic Date Due   Zoster Vaccines- Shingrix (1 of 2) Never done   INFLUENZA VACCINE  04/12/2022   COVID-19 Vaccine (4 - Pfizer risk series) 07/28/2022 (Originally 08/28/2020)   Medicare Annual Wellness ([place])  12/29/2022   MAMMOGRAM  01/21/2023   DEXA SCAN  05/18/2024   COLONOSCOPY (Pts 45-48yr Insurance coverage will need to be confirmed)  08/14/2025   Pneumonia Vaccine 76 Years old  Completed   Hepatitis C Screening  Completed   HPV VACCINES  Aged Out   Immunization  History  Administered Date(s) Administered   Fluad Quad(high Dose 65+) 07/16/2019, 07/09/2021   Influenza, High Dose Seasonal PF 06/04/2020   PFIZER(Purple Top)SARS-COV-2 Vaccination 09/27/2019, 10/18/2019, 07/03/2020   Pneumococcal Conjugate-13 07/18/2013   Pneumococcal Polysaccharide-23 09/11/2009   Zoster, Live 08/10/2012   We updated and reviewed the patient's past history in detail and it is documented below. Allergies: Patient is allergic to morphine and morphine and related. Past Medical History Patient  has a past medical history of Allergy (1990), Arthritis, Atypical mole (02/24/2015), Atypical mole (01/20/2022), Atypical mole (01/20/2022), Atypical nevi (02/24/2015), Atypical nevi (08/25/2015), Atypical nevi (08/25/2015), Atypical nevi (02/23/2016), Atypical nevi (02/23/2016), Atypical nevi (02/23/2016), Atypical nevi (08/23/2016), Atypical nevi (07/25/2018), Atypical nevi (01/22/2019), Atypical nevi (01/22/2019), Atypical nevi (08/06/2019), Atypical nevi (08/06/2019), Blood transfusion without reported diagnosis, Breast cancer ([place] (2022), CAD (coronary artery disease), native coronary artery (02/07/2019), Family history of breast cancer (08/19/2021), Family history of pancreatic cancer (08/19/2021), Fatty liver (05/21/2020), Gastric polyps (10/02/2018), GERD (gastroesophageal reflux disease) (10/02/2018), History of melanoma (10/02/2018), Hyperlipidemia, Hypertension, Melanoma ([place] (2012), Osteopenia, Polyp of colon, adenomatous (10/02/2018), PONV (postoperative nausea and vomiting), Pulmonary hypertension, primary ([place] (10/02/2018), SCCA (squamous cell carcinoma) of skin (09/18/2017), Sleep apnea, and Sleep apnea (05/26/2020). Past Surgical History Patient  has a past surgical history that includes Meniscus repair (Right, 2001); Appendectomy (1962); Abdominal hysterectomy (1990); Bladder suspension (1990); thumb surgery (Left); Colonoscopy (2016); tuabl ligation; Tonsillectomy and  adenoidectomy; Wisdom tooth extraction; Eye surgery (2013 torn retina laser); Tubal ligation (1975); and Breast lumpectomy with radioactive seed localization (Left, 09/09/2021). Family History: Patient family history includes Arthritis in her mother, paternal grandmother, and sister; Asthma in her maternal grandfather and paternal grandfather; Breast cancer (age of onset: 573 in her sister; COPD in her paternal grandfather; Diabetes in her sister; Early death in her maternal grandmother; Heart attack in her father; Heart disease in her father, maternal uncle, maternal uncle, and paternal grandmother; High Cholesterol in her sister; High blood pressure in her father, maternal grandmother, mother, paternal grandmother, and sister; Hyperlipidemia in her mother; Hypertension in her father, maternal grandmother, mother, and paternal grandmother; Kidney disease in her maternal grandfather and paternal grandfather; Pancreatic cancer in her maternal aunt; Prostate cancer in her cousin; Stroke in her maternal grandmother, mother, and paternal aunt; Varicose Veins in her mother and sister. Social History:  Patient  reports that she quit smoking about 25 years ago. Her smoking use included cigarettes. She has a 2.50 pack-year smoking history. She has never used smokeless tobacco. She reports current alcohol use of about 10.0 standard drinks of alcohol per week. She reports that she does not  use drugs.  Review of Systems: Constitutional: negative for fever or malaise Ophthalmic: negative for photophobia, double vision or loss of vision Cardiovascular: negative for chest pain, dyspnea on exertion, or new LE swelling Respiratory: negative for SOB or persistent cough Gastrointestinal: negative for abdominal pain, change in bowel habits or melena Genitourinary: negative for dysuria or gross hematuria, no abnormal uterine bleeding or disharge Musculoskeletal: negative for new gait disturbance or muscular  weakness Integumentary: negative for new or persistent rashes, no breast lumps Neurological: negative for TIA or stroke symptoms Psychiatric: negative for SI or delusions Allergic/Immunologic: negative for hives  Patient Care Team    Relationship Specialty Notifications Start End  Leamon Arnt, MD PCP - General Family Medicine  10/02/18   Sueanne Margarita, MD PCP - Cardiology Cardiology  05/10/21   Sueanne Margarita, MD Consulting Physician Cardiology  02/13/19   Hayden Pedro, MD Consulting Physician Ophthalmology  08/02/19   Warren Danes, PA-C Physician Assistant Dermatology  08/02/19   Donnie Mesa, MD Consulting Physician General Surgery  08/13/21   Truitt Merle, MD Consulting Physician Hematology  08/13/21   Eppie Gibson, MD Attending Physician Radiation Oncology  08/13/21   Edythe Clarity, [facility]  Pharmacist  08/24/21    Comment: [phone]    Objective  Vitals: BP 138/60   Pulse 61   Temp 97.8 F (36.6 C)   Ht 5' (1.524 m)   Wt 153 lb 12.8 oz (69.8 kg)   SpO2 96%   BMI 30.04 kg/m  General:  Well developed, well nourished, no acute distress  Psych:  Alert and orientedx3,normal mood and affect HEENT:  Normocephalic, atraumatic, non-icteric sclera,  supple neck without adenopathy, mass or thyromegaly Cardiovascular:  Normal S1, S2, RRR without gallop, rub or murmur Respiratory:  Good breath sounds bilaterally, CTAB with normal respiratory effort Gastrointestinal: normal bowel sounds, soft, non-tender, no noted masses. No HSM MSK: no deformities, contusions. Joints are without erythema or swelling.  Skin:  Warm, right neck and left inner forearm with macular dry rash Neurologic:    Mental status is normal. CN 2-11 are normal. Gross motor and sensory exams are normal. Normal gait. No tremor   Commons side effects, risks, benefits, and alternatives for medications and treatment plan prescribed today were discussed, and the patient expressed understanding of the given  instructions. Patient is instructed to call or message via MyChart if he/she has any questions or concerns regarding our treatment plan. No barriers to understanding were identified. We discussed Red Flag symptoms and signs in detail. Patient expressed understanding regarding what to do in case of urgent or emergency type symptoms.  Medication list was reconciled, printed and provided to the patient in AVS. Patient instructions and summary information was reviewed with the patient as documented in the AVS. This note was prepared with assistance of Dragon voice recognition software. Occasional wrong-word or sound-a-like substitutions may have occurred due to the inherent limitations of voice recognition software

## 2022-07-12 NOTE — Patient Instructions (Signed)
Please return in 12 months for your annual complete physical; please come fasting.   If you have any questions or concerns, please don't hesitate to send me a message via MyChart or call the office at [phone]. Thank you for visiting with [place] today! It's our pleasure caring for you.

## 2022-07-18 ENCOUNTER — Telehealth (HOSPITAL_COMMUNITY): Payer: Self-pay | Admitting: Emergency Medicine

## 2022-07-18 NOTE — Telephone Encounter (Signed)
Reaching out to patient to offer assistance regarding upcoming cardiac imaging study; pt verbalizes understanding of appt date/time, parking situation and where to check in, pre-test NPO status and medications ordered, and verified current allergies; name and call back number provided for further questions should they arise Marchia Bond RN Navigator Cardiac Imaging [facility] [phone] office [phone] cell  Arrival 1230 Hold lasix  Daily meds otherwise Denies iv issues

## 2022-07-19 ENCOUNTER — Other Ambulatory Visit: Payer: Self-pay | Admitting: Cardiovascular Disease

## 2022-07-19 ENCOUNTER — Ambulatory Visit (HOSPITAL_COMMUNITY)
Admission: RE | Admit: 2022-07-19 | Discharge: 2022-07-19 | Disposition: A | Discharge status: Home / Self Care | Payer: Medicare Other | Source: Ambulatory Visit | Attending: Nurse Practitioner | Admitting: Nurse Practitioner

## 2022-07-19 ENCOUNTER — Ambulatory Visit (HOSPITAL_BASED_OUTPATIENT_CLINIC_OR_DEPARTMENT_OTHER)
Admission: RE | Admit: 2022-07-19 | Discharge: 2022-07-19 | Disposition: A | Discharge status: Home / Self Care | Payer: Medicare Other | Source: Ambulatory Visit | Attending: Cardiovascular Disease | Admitting: Cardiovascular Disease

## 2022-07-19 DIAGNOSIS — R931 Abnormal findings on diagnostic imaging of heart and coronary circulation: Secondary | ICD-10-CM

## 2022-07-19 DIAGNOSIS — I2510 Atherosclerotic heart disease of native coronary artery without angina pectoris: Secondary | ICD-10-CM

## 2022-07-19 DIAGNOSIS — I471 Supraventricular tachycardia, unspecified: Secondary | ICD-10-CM | POA: Diagnosis not present

## 2022-07-19 DIAGNOSIS — I25110 Atherosclerotic heart disease of native coronary artery with unstable angina pectoris: Secondary | ICD-10-CM | POA: Diagnosis not present

## 2022-07-19 DIAGNOSIS — R0789 Other chest pain: Secondary | ICD-10-CM

## 2022-07-19 DIAGNOSIS — I270 Primary pulmonary hypertension: Secondary | ICD-10-CM | POA: Diagnosis not present

## 2022-07-19 MED ORDER — NITROGLYCERIN 0.4 MG SL SUBL
SUBLINGUAL_TABLET | SUBLINGUAL | Status: AC
  Filled 2022-07-19: qty 2

## 2022-07-19 MED ORDER — NITROGLYCERIN 0.4 MG SL SUBL
0.8000 mg | SUBLINGUAL_TABLET | Freq: Once | SUBLINGUAL | Status: AC
  Administered 2022-07-19: 0.8 mg via SUBLINGUAL

## 2022-07-19 MED ORDER — IOHEXOL 350 MG/ML SOLN
100.0000 mL | Freq: Once | INTRAVENOUS | Status: AC | PRN
  Administered 2022-07-19: 100 mL via INTRAVENOUS

## 2022-07-22 ENCOUNTER — Other Ambulatory Visit: Payer: Self-pay | Admitting: Cardiology

## 2022-08-16 ENCOUNTER — Ambulatory Visit: Payer: Medicare Other | Admitting: Physician Assistant

## 2022-08-23 ENCOUNTER — Encounter (INDEPENDENT_AMBULATORY_CARE_PROVIDER_SITE_OTHER): Payer: Medicare Other | Admitting: Ophthalmology

## 2022-08-23 DIAGNOSIS — I10 Essential (primary) hypertension: Secondary | ICD-10-CM

## 2022-08-23 DIAGNOSIS — H43813 Vitreous degeneration, bilateral: Secondary | ICD-10-CM

## 2022-08-23 DIAGNOSIS — H33301 Unspecified retinal break, right eye: Secondary | ICD-10-CM

## 2022-08-23 DIAGNOSIS — H35033 Hypertensive retinopathy, bilateral: Secondary | ICD-10-CM

## 2022-08-25 ENCOUNTER — Encounter: Payer: Self-pay | Admitting: *Deleted

## 2022-08-31 ENCOUNTER — Ambulatory Visit: Payer: Medicare Other | Admitting: Nurse Practitioner

## 2022-09-08 DIAGNOSIS — G5602 Carpal tunnel syndrome, left upper limb: Secondary | ICD-10-CM | POA: Diagnosis not present

## 2022-09-08 DIAGNOSIS — R52 Pain, unspecified: Secondary | ICD-10-CM | POA: Diagnosis not present

## 2022-09-08 DIAGNOSIS — M13849 Other specified arthritis, unspecified hand: Secondary | ICD-10-CM | POA: Diagnosis not present

## 2022-09-08 DIAGNOSIS — M79642 Pain in left hand: Secondary | ICD-10-CM | POA: Diagnosis not present

## 2022-09-09 NOTE — Progress Notes (Unsigned)
Cardiology Office Note:    Date:  09/09/2022   ID:  Madison Clements, DOB 1946/01/12, MRN [id number]  PCP:  Leamon Arnt, MD   [facility] Providers Cardiologist:  Fransico Him, MD     Referring MD: Leamon Arnt, MD   Chief Complaint: SVT  History of Present Illness:    Madison Clements is a very pleasant 76 y.o. female with a hx of pulmonary hypertension (likely a combination of WHO group 2 pulmonary venous hypertension from diastolic dysfunction and Group 3 from OSA, nonobstructive CAD, HLD, HTN, OSA, palpitations, and estrogen receptor positive breast CA.   Echocardiogram in 2018 in [place] showed normal LV function with EF 55 to 88%, grade 2 diastolic dysfunction with mild to moderate TR and mild pulmonary hypertension with RVSP 39 mmHg.  Repeat 2D echo 1 year later was unchanged.  Nuclear stress test 2015 showed no ischemia.  Cardiac catheterization 2016 showed mild CAD with calcified vessels.  Home sleep study 07/2021 showed moderate OSA with AHI 18/h and O2 sats as low as 72%.  Reported frustration with CPAP and wanted to be evaluated for inspire device and was referred to ENT.  Felt to be a good candidate for inspire and was scheduled for a sleep induced endoscopy, unfortunately she was diagnosed with breast cancer in the interim and the endoscopy was postponed.  She underwent lumpectomy on 09/09/2021.  She was last seen in cardiology clinic on 05/11/2022 by Dr. Radford Pax at which time she reported resting heart rate 120s with sensation of palpitations at times.  Occasional LE edema at the end of the day.  Echocardiogram 03/2022 showed normalization of PA pressures with treatment of sleep apnea on CPAP, right ventricle now dilated, mild to moderate TR, LVEF 60-65%, G1DD. Cardiac monitor for evaluation of palpitations revealed multiple episodes of atrial tachycardia with longest lasting 6 minutes and 2 seconds.  She was advised to continue Cardizem and start Toprol  XL 25 mg daily.  Advised to follow-up in 4 weeks.  Seen by me on 07/06/22 for follow-up of frequent SVT on monitor, is accompanied by her daughter. Has been monitoring with smart watch with variations in HR from 50s up to 100s bpm with 1 episode of HR 140. This episode occurred at 0600 while she was sleeping. May have been what woke her up, otherwise she was asymptomatic. BP is well-controlled but varies greatly. She denied lightheadedness, presyncope, syncope. Symptoms of palpitations have improved since starting Toprol XL in addition to diltiazem (which she has taken for many years). Recent onset of difficulty sleeping which she never had in the past. Is removing CPAP after 3-4 hours due to restlessness whereas prior to starting metoprolol she slept 6-8 hours each night with her CPAP on. Also having pain in left arm she feels is 2/2 to carpal tunnel syndrome. Is having chest pressure and SOB, worse going up an incline and with exertion. Additionally, HR increases and she feels a different sensation in her throat. No n/v, diaphoresis. No orthopnea, PND, weakness, or edema. Also has increased fatigue, although she is uncertain whether this is 2/2 cancer treatment.   Today, she is here   Past Medical History:  Diagnosis Date   Allergy 1990   Morphine following surgery   Arthritis    generalized   Atypical mole 02/24/2015   RGHT LATERAL THIGH MODERATE   Atypical mole 01/20/2022   Left Breast (mild)   Atypical mole 01/20/2022   Mid Back (mild)  Atypical nevi 02/24/2015   RIGHT NECK MILD   Atypical nevi 08/25/2015   LEFT UPPER ARM MILD   Atypical nevi 08/25/2015   LEFT LATERAL FOREARM MILD/FREE   Atypical nevi 02/23/2016   LEFT THIGH MODERATE/FREE   Atypical nevi 02/23/2016   LEFT MEDIAL LEG MODERATE/FREE   Atypical nevi 02/23/2016   LEFT UPPER BACK MILD /FREE   Atypical nevi 08/23/2016   RIGHT ANT PROX THIGH MILD /FREE   Atypical nevi 07/25/2018   MID LOWER BACK MODERATE   Atypical  nevi 01/22/2019   LEFT MID BACK MILD /FREE   Atypical nevi 01/22/2019   LEFT NECK MILD   Atypical nevi 08/06/2019   LEFT INNER BREAST MODERATE W/S   Atypical nevi 08/06/2019   LEFT OUTER SIDE MILD/FREE   Blood transfusion without reported diagnosis    hx of   Breast cancer ([place]) 2022   CAD (coronary artery disease), native coronary artery 02/07/2019   Minimal CAD at the time of cath in 2016 in [place]   Family history of breast cancer 08/19/2021   Family history of pancreatic cancer 08/19/2021   Fatty liver 05/21/2020   Gastric polyps 10/02/2018   EGD 03/2015; benign   GERD (gastroesophageal reflux disease) 10/02/2018   on meds   History of melanoma 10/02/2018   Abdomen 2012; 2013; s/p local excisions.    Hyperlipidemia    on meds   Hypertension    on meds   Melanoma ([place]) 2012   MM- central lower abdomen- (ECX) [facility]   Osteopenia    Polyp of colon, adenomatous 10/02/2018   Colonoscopy 03/2015; recheck in 2021   PONV (postoperative nausea and vomiting)    Pulmonary hypertension, primary ([place]) 10/02/2018   SCCA (squamous cell carcinoma) of skin 09/18/2017   RIGHT ANT. DISTAL LOWER LEG TREATED BY DR. Delton Coombes   Sleep apnea    Sleep apnea 05/26/2020   uses CPAP    Past Surgical History:  Procedure Laterality Date   [place]   BREAST LUMPECTOMY WITH RADIOACTIVE SEED LOCALIZATION Left 09/09/2021   Procedure: LEFT BREAST LUMPECTOMY WITH RADIOACTIVE SEED LOCALIZATION X2;  Surgeon: Donnie Mesa, MD;  Location: [place];  Service: General;  Laterality: Left;   COLONOSCOPY  2016   in [place]-hx of polyps   EYE SURGERY  2013 torn retina laser   MENISCUS REPAIR Right 2001   right knee   thumb surgery Left    TONSILLECTOMY AND ADENOIDECTOMY     tuabl ligation     TUBAL LIGATION  1975   WISDOM TOOTH EXTRACTION      Current Medications: No outpatient  medications have been marked as taking for the 09/14/22 encounter (Appointment) with Emmaline Life, NP.     Allergies:   Morphine and Morphine and related   Social History   Socioeconomic History   Marital status: Widowed    Spouse name: Not on file   Number of children: 1   Years of education: Not on file   Highest education level: Not on file  Occupational History   Occupation: Retired [employer]  Tobacco Use   Smoking status: Former    Packs/day: 0.25    Years: 10.00    Total pack years: 2.50    Types: Cigarettes    Quit date: 09/12/1996    Years since quitting: 26.0   Smokeless tobacco: Never  Vaping Use   Vaping Use:  Never used  Substance and Sexual Activity   Alcohol use: Yes    Alcohol/week: 10.0 standard drinks of alcohol    Types: 5 Glasses of wine, 5 Shots of liquor per week    Comment: occassional    Drug use: Never   Sexual activity: Not Currently    Birth control/protection: Abstinence, Post-menopausal    Comment: hysterectomy  Other Topics Concern   Not on file  Social History Narrative   Widowed 2018 after 19 years of marriage   One daughter, 2 grandchildren. Lives in [place]   Sister lives in same complex in [place]    Previously lived in [place], [place] Strain: [place]  (12/28/2021)   Overall Financial Resource Strain (CARDIA)    Difficulty of Paying Living Expenses: Not hard at all  Food Insecurity: No Food Insecurity (06/06/2022)   Hunger Vital Sign    Worried About Running Out of Food in the Last Year: Never true    Ran Out of Food in the Last Year: Never true  Transportation Needs: No Transportation Needs (06/06/2022)   PRAPARE - Hydrologist (Medical): No    Lack of Transportation (Non-Medical): No  Physical Activity: Insufficiently Active (12/28/2021)   Exercise Vital Sign    Days of Exercise per Week: 5 days    Minutes of Exercise per Session: 20 min  Stress:  No Stress Concern Present (12/28/2021)   [place]    Feeling of Stress : Not at all  Social Connections: Moderately Integrated (12/28/2021)   Social Connection and Isolation Panel [NHANES]    Frequency of Communication with Friends and Family: More than three times a week    Frequency of Social Gatherings with Friends and Family: More than three times a week    Attends Religious Services: 1 to 4 times per year    Active Member of [employer] or Organizations: Yes    Attends Archivist Meetings: 1 to 4 times per year    Marital Status: Widowed     Family History: The patient's family history includes Arthritis in her mother, paternal grandmother, and sister; Asthma in her maternal grandfather and paternal grandfather; Breast cancer (age of onset: 40) in her sister; COPD in her paternal grandfather; Diabetes in her sister; Early death in her maternal grandmother; Heart attack in her father; Heart disease in her father, maternal uncle, maternal uncle, and paternal grandmother; High Cholesterol in her sister; High blood pressure in her father, maternal grandmother, mother, paternal grandmother, and sister; Hyperlipidemia in her mother; Hypertension in her father, maternal grandmother, mother, and paternal grandmother; Kidney disease in her maternal grandfather and paternal grandfather; Pancreatic cancer in her maternal aunt; Prostate cancer in her cousin; Stroke in her maternal grandmother, mother, and paternal aunt; Varicose Veins in her mother and sister. There is no history of Colon polyps, Colon cancer, Esophageal cancer, Rectal cancer, or Stomach cancer.  ROS:   Please see the history of present illness.    *** All other systems reviewed and are negative.  Labs/Other Studies Reviewed:    The following studies were reviewed today:  CCTA 07/19/22 Calcium Score: Calcium noted in LAD and RCA. Small amount in LCX area too  small to record   LM 0 LCX 0 LAD 259 RCA 85.7 Total 344 which is 58 th percentile for age/sex Coronary Arteries: Right dominant with no anomalies LM: Normal  LAD: 25-49% calcified plaque proximally 50-69% calcified plaque in mid vessel at D1 take off 25-49% calcified plaque distally D1: 1-24% calcified plaque proximally Circumflex: 1-24% mixed plaque proximally / mid OM1: Normal AV groove: Normal RCA: 1-24% calcified plaque proximal, 1-24% mixed plaque mid 1-24% calcified plaque distal PDA: Normal PLA: Normal   IMPRESSION: 1. Calcium score 344 which is 76 th percentile for age/sex   2.  Normal ascending thoracic aorta 2.8 cm   3.  CAD RADS 3 see description above study sent for FFR CT   IMPRESSION: Normal FFR CT In particular areas of concern in LAD negative  Echo 04/05/22   1. Left ventricular ejection fraction, by estimation, is 60 to 65%. The  left ventricle has normal function. The left ventricle has no regional  wall motion abnormalities. Left ventricular diastolic parameters are  consistent with Grade I diastolic  dysfunction (impaired relaxation). The average left ventricular global  longitudinal strain is -19.2 %. The global longitudinal strain is normal.   2. Right ventricular systolic function is normal. The right ventricular  size is dilated. There is normal pulmonary artery systolic pressure. The  estimated right ventricular systolic pressure is 89.2 mmHg.   3. The mitral valve is normal in structure.   4. Tricuspid valve regurgitation is mild to moderate and eccentric in  nature.   5. The aortic valve is tricuspid. There is mild calcification of the  aortic valve. There is mild thickening of the aortic valve. Aortic valve  regurgitation is not visualized. No aortic stenosis is present.   6. The inferior vena cava is normal in size with greater than 50%  respiratory variability, suggesting right atrial pressure of 3 mmHg.   Comparison(s): RV has dilated  from prior.   Cardiac monitor 06/05/22    Predominant rhythm was normal sinus rhythm with average heart rate 67bpm and ranged from 47 to 184bpm.   53 episodes of SVT with longest interval lasting 6 minutes and 2 seconds with fastest heart rate 184bpm.   Isolated PACs, atrial couplest and triplets   Rate PVC   Lexiscan Myoview 03/17/20  The left ventricular ejection fraction is hyperdynamic (>65%). Nuclear stress EF: 78%. There was no ST segment deviation noted during stress. No T wave inversion was noted during stress. The study is normal. This is a low risk study. No prior study for comparison.   Recent Labs: 05/20/2022: ALT 22; Hemoglobin 14.3; Platelets 258 07/06/2022: BUN 16; Creatinine, Ser 1.00; Potassium 3.8; Sodium 139  Recent Lipid Panel    Component Value Date/Time   CHOL 137 05/18/2022 1200   TRIG 155 (H) 05/18/2022 1200   HDL 48 05/18/2022 1200   CHOLHDL 2.9 05/18/2022 1200   CHOLHDL 3 07/09/2021 0935   VLDL 27.0 07/09/2021 0935   LDLCALC 63 05/18/2022 1200   LDLCALC 55 03/09/2021 0914   LDLDIRECT 68.0 01/14/2020 0904     Risk Assessment/Calculations:      Physical Exam:    VS:  There were no vitals taken for this visit.    Wt Readings from Last 3 Encounters:  07/12/22 153 lb 12.8 oz (69.8 kg)  07/11/22 153 lb (69.4 kg)  07/06/22 156 lb 9.6 oz (71 kg)     GEN:  Well nourished, well developed in no acute distress HEENT: Normal NECK: No JVD; No carotid bruits CARDIAC: RRR, no murmurs, rubs, gallops RESPIRATORY:  Clear to auscultation without rales, wheezing or rhonchi  ABDOMEN: Soft, non-tender, non-distended MUSCULOSKELETAL:  No edema; No deformity. 2+  pedal pulses, equal bilaterally SKIN: Warm and dry NEUROLOGIC:  Alert and oriented x 3 PSYCHIATRIC:  Normal affect   EKG:  EKG is not ordered today.    Diagnoses:    No diagnosis found.  Assessment and Plan:     CAD with unstable angina:  Coronary CTA 07/19/22 for new onset DOE and chest  pressure worsening with activity. CT revealed coronary calcium score 344 (76%) with most concerning CAD in LAD with 25-49% proximal LAD, 50-69% calcified plaque in mid vessel at D1 takeoff and 25 to 49% calcified plaque distally with normal FFR. Additional mild disease 1-24% in D1, circumflex, RCA. Prior cath 2016 in [facility] revealed minimal nonobstructive CAD. orsens with activity. No n/v, diaphoresis. Concern for worsening CAD.   SVT: Cardiac monitor 06/05/2022 revealed 53 episodes of SVT with longest interval lasting 6 minutes 2 seconds. Symptoms of palpitations have improved since starting Toprol XL 25 mg in addition to diltiazem 180 mg. Referred to EP and seen by Dr. Lovena Le on 07/11/22. Her metoprolol was discontinued 2/2 fatigue and cardizem was increased to 240 mg daily. Recommendation to follow-up with EP in 6 months.   Pulmonary hypertension: Mildly dilated RV with normal pulmonary artery systolic pressure, RVSP 29 mmHg, mild to moderate TR on echo 7/25/253 with recommendation to repeat in 6 months by Dr. Radford Pax.  As noted below, recent reduction in time she is able to wear CPAP. She is going to try taking metoprolol at night to see if this helps her sleep. Having DOE and chest pressure. No orthopnea, edema, or PND. Will get CTA for ischemia evaluation and see her back in soon follow-up.   OSA: Moderate OSA with AHI 17.9/hr on HST and CPAP. Referral placed to Dr. Wilburn Cornelia tor sleep endoscopy for [facility] device. She would like to postpone further evaluation for Inspire device at this time due to concern of undergoing anesthesia.  Recent onset difficulty sleeping more than 3 to 4 hours with CPAP whereas she previously tolderated machine for 6-8 hours/night. She is going to try taking Toprol XL at night  to see if this helps her sleep. Will forward notes to Dr. Radford Pax for her awareness and for possible CPAP titration.   Hyperlipidemia LDL goal < 70: LDL 63 on 05/18/2022. Continue  rosuvastatin.   Disposition: ***  Medication Adjustments/Labs and Tests Ordered: Current medicines are reviewed at length with the patient today.  Concerns regarding medicines are outlined above.  No orders of the defined types were placed in this encounter.  No orders of the defined types were placed in this encounter.   There are no Patient Instructions on file for this visit.   Signed, Emmaline Life, NP  09/09/2022 1:38 PM    [place]

## 2022-09-14 ENCOUNTER — Telehealth: Payer: Self-pay

## 2022-09-14 ENCOUNTER — Encounter: Payer: Self-pay | Admitting: Nurse Practitioner

## 2022-09-14 ENCOUNTER — Ambulatory Visit: Payer: Medicare Other | Attending: Nurse Practitioner | Admitting: Nurse Practitioner

## 2022-09-14 VITALS — BP 120/60 | HR 68 | Ht 60.0 in | Wt 155.0 lb

## 2022-09-14 DIAGNOSIS — I10 Essential (primary) hypertension: Secondary | ICD-10-CM | POA: Diagnosis not present

## 2022-09-14 DIAGNOSIS — G4733 Obstructive sleep apnea (adult) (pediatric): Secondary | ICD-10-CM | POA: Insufficient documentation

## 2022-09-14 DIAGNOSIS — E785 Hyperlipidemia, unspecified: Secondary | ICD-10-CM | POA: Diagnosis not present

## 2022-09-14 DIAGNOSIS — R0789 Other chest pain: Secondary | ICD-10-CM | POA: Diagnosis not present

## 2022-09-14 DIAGNOSIS — I471 Supraventricular tachycardia, unspecified: Secondary | ICD-10-CM | POA: Diagnosis not present

## 2022-09-14 DIAGNOSIS — I270 Primary pulmonary hypertension: Secondary | ICD-10-CM | POA: Diagnosis not present

## 2022-09-14 DIAGNOSIS — I2510 Atherosclerotic heart disease of native coronary artery without angina pectoris: Secondary | ICD-10-CM | POA: Diagnosis not present

## 2022-09-14 NOTE — Patient Instructions (Signed)
Medication Instructions:   Your physician recommends that you continue on your current medications as directed. Please refer to the Current Medication list given to you today.   *If you need a refill on your cardiac medications before your next appointment, please call your pharmacy*  Lab Work:  Your physician recommends that you return for a FASTING lipid profile. Wednesday, June 26. You can come in on the day of your appointment anytime between 7:30-4:30 fasting from midnight the night before.   If you have labs (blood work) drawn today and your tests are completely normal, you will receive your results only by: [place] (if you have MyChart) OR A paper copy in the mail If you have any lab test that is abnormal or we need to change your treatment, we will call you to review the results.   Testing/Procedures:  None ordered.   Follow-Up: At [facility], you and your health needs are our priority.  As part of our continuing mission to provide you with exceptional heart care, we have created designated Provider Care Teams.  These Care Teams include your primary Cardiologist (physician) and Advanced Practice Providers (APPs -  Physician Assistants and Nurse Practitioners) who all work together to provide you with the care you need, when you need it.  We recommend signing up for the patient portal called "MyChart".  Sign up information is provided on this After Visit Summary.  MyChart is used to connect with patients for Virtual Visits (Telemedicine).  Patients are able to view lab/test results, encounter notes, upcoming appointments, etc.  Non-urgent messages can be sent to your provider as well.   To learn more about what you can do with MyChart, go to [email or web address].    Your next appointment:   6 month(s)  The format for your next appointment:   In Person  Provider:   Crissie Sickles, MD    Other Instructions  Your physician wants you to follow-up in: July  with Dr. Radford Pax.  You will receive a reminder letter in the mail two months in advance. If you don't receive a letter, please call our office to schedule the follow-up appointment.   Important Information About Sugar

## 2022-09-14 NOTE — Telephone Encounter (Signed)
Spoke with patient, reviewed pharmacist recommendation to get a short list of osteoporosis drugs that Dr. Burr Medico prefers. Then patient can write in [place] what those drugs are for pharmacist to review. Patient agrees to plan.

## 2022-09-18 NOTE — Assessment & Plan Note (Addendum)
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 Biopsy 08/11/21 confirmed invasive mammary carcinoma. -left lumpectomy on 09/09/21 by Dr. 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 No nodes were removed due to her age. -she received adjuvant RT, completed in 11/2021 -she is on adjuvant tamoxifen,started in 12/2021, but could not tolerate. She subsequently tried anastrozole and still can not tolerate. She stopped in 04/2022 -continue breast cancer surveillance

## 2022-09-18 NOTE — Assessment & Plan Note (Signed)
-  Her previous DEXA on 11/15/19 showed osteopenia (T-score -1.5). -Dexa 05/2022 showed T score -3.7 at forearm, -2.1 at hip; she offered fosamax by her PCP -I discussed that tamoxifen can help strengthen her bones and overall causes less joint pain.

## 2022-09-19 ENCOUNTER — Inpatient Hospital Stay: Payer: Medicare Other | Attending: Hematology

## 2022-09-19 ENCOUNTER — Inpatient Hospital Stay (HOSPITAL_BASED_OUTPATIENT_CLINIC_OR_DEPARTMENT_OTHER): Payer: Medicare Other | Admitting: Hematology

## 2022-09-19 ENCOUNTER — Encounter: Payer: Self-pay | Admitting: Hematology

## 2022-09-19 VITALS — BP 122/60 | HR 58 | Temp 97.8°F | Resp 18 | Ht 60.0 in | Wt 153.3 lb

## 2022-09-19 DIAGNOSIS — M8580 Other specified disorders of bone density and structure, unspecified site: Secondary | ICD-10-CM | POA: Diagnosis not present

## 2022-09-19 DIAGNOSIS — Z90710 Acquired absence of both cervix and uterus: Secondary | ICD-10-CM | POA: Diagnosis not present

## 2022-09-19 DIAGNOSIS — Z79810 Long term (current) use of selective estrogen receptor modulators (SERMs): Secondary | ICD-10-CM | POA: Insufficient documentation

## 2022-09-19 DIAGNOSIS — C50812 Malignant neoplasm of overlapping sites of left female breast: Secondary | ICD-10-CM | POA: Insufficient documentation

## 2022-09-19 DIAGNOSIS — Z170 Estrogen receptor positive status [ER+]: Secondary | ICD-10-CM

## 2022-09-19 DIAGNOSIS — M810 Age-related osteoporosis without current pathological fracture: Secondary | ICD-10-CM

## 2022-09-19 DIAGNOSIS — Z85820 Personal history of malignant melanoma of skin: Secondary | ICD-10-CM | POA: Diagnosis not present

## 2022-09-19 DIAGNOSIS — I10 Essential (primary) hypertension: Secondary | ICD-10-CM | POA: Insufficient documentation

## 2022-09-19 DIAGNOSIS — Z800 Family history of malignant neoplasm of digestive organs: Secondary | ICD-10-CM | POA: Insufficient documentation

## 2022-09-19 DIAGNOSIS — E559 Vitamin D deficiency, unspecified: Secondary | ICD-10-CM

## 2022-09-19 DIAGNOSIS — Z803 Family history of malignant neoplasm of breast: Secondary | ICD-10-CM | POA: Diagnosis not present

## 2022-09-19 LAB — CMP (CANCER CENTER ONLY)
ALT: 21 U/L (ref 0–44)
AST: 24 U/L (ref 15–41)
Albumin: 4.6 g/dL (ref 3.5–5.0)
Alkaline Phosphatase: 92 U/L (ref 38–126)
Anion gap: 8 (ref 5–15)
BUN: 17 mg/dL (ref 8–23)
CO2: 27 mmol/L (ref 22–32)
Calcium: 10.2 mg/dL (ref 8.9–10.3)
Chloride: 101 mmol/L (ref 98–111)
Creatinine: 1.06 mg/dL — ABNORMAL HIGH (ref 0.44–1.00)
GFR, Estimated: 54 mL/min — ABNORMAL LOW (ref >60)
Glucose, Bld: 97 mg/dL (ref 70–99)
Potassium: 3.7 mmol/L (ref 3.5–5.1)
Sodium: 136 mmol/L (ref 135–145)
Total Bilirubin: 1.1 mg/dL (ref 0.3–1.2)
Total Protein: 6.7 g/dL (ref 6.5–8.1)

## 2022-09-19 LAB — CBC WITH DIFFERENTIAL (CANCER CENTER ONLY)
Abs Immature Granulocytes: 0.01 10*3/uL (ref 0.00–0.07)
Basophils Absolute: 0 10*3/uL (ref 0.0–0.1)
Basophils Relative: 0 %
Eosinophils Absolute: 0.1 10*3/uL (ref 0.0–0.5)
Eosinophils Relative: 2 %
HCT: 42.2 % (ref 36.0–46.0)
Hemoglobin: 15.1 g/dL — ABNORMAL HIGH (ref 12.0–15.0)
Immature Granulocytes: 0 %
Lymphocytes Relative: 26 %
Lymphs Abs: 1.4 10*3/uL (ref 0.7–4.0)
MCH: 30.5 pg (ref 26.0–34.0)
MCHC: 35.8 g/dL (ref 30.0–36.0)
MCV: 85.3 fL (ref 80.0–100.0)
Monocytes Absolute: 0.6 10*3/uL (ref 0.1–1.0)
Monocytes Relative: 10 %
Neutro Abs: 3.4 10*3/uL (ref 1.7–7.7)
Neutrophils Relative %: 62 %
Platelet Count: 254 10*3/uL (ref 150–400)
RBC: 4.95 MIL/uL (ref 3.87–5.11)
RDW: 12.8 % (ref 11.5–15.5)
WBC Count: 5.5 10*3/uL (ref 4.0–10.5)
nRBC: 0 % (ref 0.0–0.2)

## 2022-09-19 LAB — VITAMIN D 25 HYDROXY (VIT D DEFICIENCY, FRACTURES): Vit D, 25-Hydroxy: 35.56 ng/mL (ref 30–100)

## 2022-09-19 NOTE — Progress Notes (Signed)
[place]   [phone] [phone]   Clinic Follow up Note   Patient Care Team: Leamon Arnt, MD as PCP - General (Family Medicine) Sueanne Margarita, MD as PCP - Cardiology (Cardiology) Sueanne Margarita, MD as Consulting Physician (Cardiology) Hayden Pedro, MD as Consulting Physician (Ophthalmology) Warren Danes, PA-C as Physician Assistant (Dermatology) Donnie Mesa, MD as Consulting Physician (General Surgery) Truitt Merle, MD as Consulting Physician (Hematology) Eppie Gibson, MD as Attending Physician (Radiation Oncology) Edythe Clarity, [id number] [facility] (Pharmacist)  Date of Service:  09/19/2022  CHIEF COMPLAINT: f/u of  left breast cancer    CURRENT THERAPY: Observation    ASSESSMENT:  Madison Clements is a 77 y.o. female with   Malignant neoplasm of overlapping sites of left breast in female, estrogen receptor positive ([place]) Stage IA, pT2, cN0, ER+/PR+/HER2-, Grade 2, RS 21 -she was under short-term f/u for left breast asymmetry. Biopsy 08/11/21 confirmed invasive mammary carcinoma. -left lumpectomy on 09/09/21 by Dr. 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 No nodes were removed due to her age. -she received adjuvant RT, completed in 11/2021 -she is on adjuvant tamoxifen,started in 12/2021, but could not tolerate. She subsequently tried anastrozole and still can not tolerate. She stopped in 04/2022 -continue breast cancer surveillance    Osteoporosis of forearm -Her previous DEXA on 11/15/19 showed osteopenia (T-score -1.5). -Dexa 05/2022 showed T score -3.7 at forearm, -2.1 at hip; bisphosphonate was discussed with her last time. -Recommend her to consider Zometa, which increase her bone density, also reduce her risk of bone metastasis from breast cancer.  Benefit and potential side effect, especially the risk of jaw necrosis, and the importance of dental care, were discussed with her in detail.  She agrees to proceed.     PLAN: Discuss with patient about Zometa and side effect, she agrees to start in 2 weeks -Lab reviewed - Instruct patient to take calcium supplement and vitD -lab and f/u in 6-7 month with second dose zometa    SUMMARY OF ONCOLOGIC HISTORY: Oncology History Overview Note   Cancer Staging  Malignant neoplasm of overlapping sites of left breast in female, estrogen receptor positive ([place]) Staging form: Breast, AJCC 8th Edition - Clinical stage from 08/11/2021: Stage IA (cT1c, cN0, cM0, G2, ER+, PR+, HER2-) - Signed by Truitt Merle, MD on 08/18/2021     Malignant neoplasm of overlapping sites of left breast in female, estrogen receptor positive ([place])  07/27/2021 Mammogram   EXAM: DIGITAL DIAGNOSTIC UNILATERAL LEFT MAMMOGRAM WITH TOMOSYNTHESIS AND CAD; ULTRASOUND LEFT BREAST LIMITED  IMPRESSION: 1. There is a suspicious mass in the left breast at 9 o'clock measuring 1.8 cm.   2. There is a 1.0 cm group of suspicious linear calcifications anterior to the suspicious mass in the left breast, as well as a faint 2 mm group which lie 1 cm lateral to the linear calcifications.   3.  No evidence of left axillary lymphadenopathy   08/11/2021 Cancer Staging   Staging form: Breast, AJCC 8th Edition - Clinical stage from 08/11/2021: Stage IA (cT1c, cN0, cM0, G2, ER+, PR+, HER2-) - Signed by Truitt Merle, MD on 08/18/2021 Stage prefix: Initial diagnosis Histologic grading system: 3 grade system   08/11/2021 Initial Biopsy   Diagnosis 1. Breast, left, needle core biopsy, 9 o'clock, ribbon clip - INVASIVE MAMMARY CARCINOMA - SEE COMMENT 2. Breast, left, needle core biopsy, lower inner quadrant, x clip - INVASIVE MAMMARY CARCINOMA - MAMMARY CARCINOMA IN-SITU -  CALCIFICATIONS - SEE COMMENT Microscopic Comment 1. The biopsy material shows an infiltrative proliferation of cells with arranged linearly and in small clusters. Based on the biopsy, the carcinoma appears Nottingham grade 2 of 3 and  measures 1.2 cm in greatest linear extent. 2. Based on the biopsy, the carcinoma appears Nottingham grade 2 of 3 and measures 0.2 cm in greatest linear extent.  1. PROGNOSTIC INDICATORS Results: The tumor cells are NEGATIVE for Her2 (1+). Estrogen Receptor: 100%, POSITIVE, STRONG STAINING INTENSITY Progesterone Receptor: 60%, POSITIVE, STRONG STAINING INTENSITY Proliferation Marker Ki67: 10%  2. PROGNOSTIC INDICATORS Results: The tumor cells are NEGATIVE for Her2 (1+). Estrogen Receptor: 100%, POSITIVE, STRONG STAINING INTENSITY Progesterone Receptor: 60%, POSITIVE, STRONG STAINING INTENSITY Proliferation Marker Ki67: 15%   08/13/2021 Mammogram   EXAM: DIGITAL DIAGNOSTIC UNILATERAL RIGHT MAMMOGRAM WITH TOMOSYNTHESIS AND CAD  IMPRESSION: No mammographic evidence for malignancy.   08/16/2021 Initial Diagnosis   Malignant neoplasm of overlapping sites of left breast in female, estrogen receptor positive ([place])   08/25/2021 Imaging   EXAM: BILATERAL BREAST MRI WITH AND WITHOUT CONTRAST  IMPRESSION: 1. Biopsy-proven invasive ductal carcinoma measuring approximately 2.8 x 1.7 x 1.5 cm in the inner breast at middle depth, associated with the ribbon shaped tissue marking clip placed at the time of core needle biopsy. Enhancement extends approximately 1.4 cm posterior to the clip. 2. Approximate 1.8 cm post biopsy hematoma with associated rim enhancement at the site of the biopsy-proven invasive ductal carcinoma and DCIS in the LOWER INNER QUADRANT at anterior depth associated with the X shaped tissue marking clip. 3. In combination, the overall enhancement spans approximately 4 cm. 4. No MRI evidence of malignancy involving the RIGHT breast. 5. No pathologic lymphadenopathy.   08/27/2021 Genetic Testing   egative hereditary cancer genetic testing: no pathogenic variants detected in [facility] BRCAPlus Panel and CancerNext-Expanded +RNAinsight Panel.  Variant of uncertain significance  detected in MSH3 at  p.N118I (c.353A>T).  The report dates are 08/27/2021 and 08/30/2021.   The BRCAplus panel offered by [employer] and includes sequencing and deletion/duplication analysis for the following 8 genes: ATM, BRCA1, BRCA2, CDH1, CHEK2, PALB2, PTEN, and TP53.  The CancerNext-Expanded gene panel offered by [facility] and includes sequencing, rearrangement, and RNA analysis for the following 77 genes: AIP, ALK, APC, ATM, AXIN2, BAP1, BARD1, BLM, BMPR1A, BRCA1, BRCA2, BRIP1, CDC73, CDH1, CDK4, CDKN1B, CDKN2A, CHEK2, CTNNA1, DICER1, FANCC, FH, FLCN, GALNT12, KIF1B, LZTR1, MAX, MEN1, MET, MLH1, MSH2, MSH3, MSH6, MUTYH, NBN, NF1, NF2, NTHL1, PALB2, PHOX2B, PMS2, POT1, PRKAR1A, PTCH1, PTEN, RAD51C, RAD51D, RB1, RECQL, RET, SDHA, SDHAF2, SDHB, SDHC, SDHD, SMAD4, SMARCA4, SMARCB1, SMARCE1, STK11, SUFU, TMEM127, TP53, TSC1, TSC2, VHL and XRCC2 (sequencing and deletion/duplication); EGFR, EGLN1, HOXB13, KIT, MITF, PDGFRA, POLD1, and POLE (sequencing only); EPCAM and GREM1 (deletion/duplication only).    09/09/2021 Definitive Surgery   FINAL MICROSCOPIC DIAGNOSIS:   A. BREAST, LEFT, LUMPECTOMY:  -  Invasive ductal carcinoma, Nottingham grade 2 of 3, 3.5 cm  -  Ductal carcinoma in-situ, intermediate grade  -  Margins uninvolved by carcinoma (<0.1 cm; anterior)  -  Previous biopsy site changes present    09/09/2021 Oncotype testing   Oncotype DX was obtained on the final surgical sample and the recurrence score of 21 predicts a risk of recurrence outside the breast over the next 9 years of 7%, if the patient's only systemic therapy is an antiestrogen for 5 years.  It also predicts no benefit from chemotherapy.   10/20/2021 - 11/16/2021 Radiation Therapy   Site  Technique Total Dose (Gy) Dose per Fx (Gy) Completed Fx Beam Energies  Breast, Left: Breast_L_axilla 3D 40.05/40.05 2.67 15/15 10X  Breast, Left: Breast_L_Bst 3D 10/10 2 5/5 6X, 10X     12/06/2021 - 04/2022 Anti-estrogen oral therapy    Tamoxifen x 5 years unable to tolerate, attempted anastrozole and had similar side effects.  Opted to forego adjuvant antiestrogen therapy      INTERVAL HISTORY:  Madison Clements is here for a follow up of  She was last seen by NP Lindsey Cause  on 05/20/22 She presents to the clinic alone Pt ask quest about  DEXA. Overall pt is doing fine.      All other systems were reviewed with the patient and are negative.  MEDICAL HISTORY:  Past Medical History:  Diagnosis Date   Allergy 1990   Morphine following surgery   Arthritis    generalized   Atypical mole 02/24/2015   RGHT LATERAL THIGH MODERATE   Atypical mole 01/20/2022   Left Breast (mild)   Atypical mole 01/20/2022   Mid Back (mild)   Atypical nevi 02/24/2015   RIGHT NECK MILD   Atypical nevi 08/25/2015   LEFT UPPER ARM MILD   Atypical nevi 08/25/2015   LEFT LATERAL FOREARM MILD/FREE   Atypical nevi 02/23/2016   LEFT THIGH MODERATE/FREE   Atypical nevi 02/23/2016   LEFT MEDIAL LEG MODERATE/FREE   Atypical nevi 02/23/2016   LEFT UPPER BACK MILD /FREE   Atypical nevi 08/23/2016   RIGHT ANT PROX THIGH MILD /FREE   Atypical nevi 07/25/2018   MID LOWER BACK MODERATE   Atypical nevi 01/22/2019   LEFT MID BACK MILD /FREE   Atypical nevi 01/22/2019   LEFT NECK MILD   Atypical nevi 08/06/2019   LEFT INNER BREAST MODERATE W/S   Atypical nevi 08/06/2019   LEFT OUTER SIDE MILD/FREE   Blood transfusion without reported diagnosis    hx of   Breast cancer ([place]) 2022   CAD (coronary artery disease), native coronary artery 02/07/2019   Minimal CAD at the time of cath in 2016 in [place]   Family history of breast cancer 08/19/2021   Family history of pancreatic cancer 08/19/2021   Fatty liver 05/21/2020   Gastric polyps 10/02/2018   EGD 03/2015; benign   GERD (gastroesophageal reflux disease) 10/02/2018   on meds   History of melanoma 10/02/2018   Abdomen 2012; 2013; s/p local excisions.     Hyperlipidemia    on meds   Hypertension    on meds   Melanoma ([place]) 2012   MM- central lower abdomen- (ECX) [facility]   Osteopenia    Polyp of colon, adenomatous 10/02/2018   Colonoscopy 03/2015; recheck in 2021   PONV (postoperative nausea and vomiting)    Pulmonary hypertension, primary ([place]) 10/02/2018   SCCA (squamous cell carcinoma) of skin 09/18/2017   RIGHT ANT. DISTAL LOWER LEG TREATED BY DR. Delton Coombes   Sleep apnea    Sleep apnea 05/26/2020   uses CPAP    SURGICAL HISTORY: Past Surgical History:  Procedure Laterality Date   ABDOMINAL HYSTERECTOMY  1990   APPENDECTOMY  1962   BLADDER SUSPENSION  1990   BREAST LUMPECTOMY WITH RADIOACTIVE SEED LOCALIZATION Left 09/09/2021   Procedure: LEFT BREAST LUMPECTOMY WITH RADIOACTIVE SEED LOCALIZATION X2;  Surgeon: Donnie Mesa, MD;  Location: [place];  Service: General;  Laterality: Left;   COLONOSCOPY  2016   in [place]-hx of polyps   EYE SURGERY  2013 torn retina laser   MENISCUS REPAIR Right 2001   right knee   thumb surgery Left    TONSILLECTOMY AND ADENOIDECTOMY     tuabl ligation     TUBAL LIGATION  1975   WISDOM TOOTH EXTRACTION      I have reviewed the social history and family history with the patient and they are unchanged from previous note.  ALLERGIES:  is allergic to morphine and morphine and related.  MEDICATIONS:  Current Outpatient Medications  Medication Sig Dispense Refill   celecoxib (CELEBREX) 100 MG capsule TAKE 1 CAPSULE EVERY DAY 90 capsule 3   Cholecalciferol (VITAMIN D3) 50 MCG (2000 UT) capsule Take 2,000 Units by mouth daily. Pt takes every other day     diclofenac Sodium (VOLTAREN) 1 % GEL      diltiazem (CARDIZEM CD) 240 MG 24 hr capsule Take 1 capsule (240 mg total) by mouth daily. 90 capsule 3   folic acid (FOLVITE) 281 MCG tablet Take 400 mcg by mouth daily.     furosemide (LASIX) 40 MG tablet Take 1 tablet (40 mg total) daily. Take an extra 1/2 tablet (20 mg  total) as needed. (Patient taking differently: Take 40 mg by mouth daily. Take 1 tablet (40 mg total) daily. Take an extra 1/2 tablet (20 mg total) as needed.) 135 tablet 3   lisinopril (ZESTRIL) 10 MG tablet Take 1 tablet (10 mg total) by mouth daily. 90 tablet 3   omeprazole (PRILOSEC) 20 MG capsule TAKE 1 CAPSULE EVERY DAY 90 capsule 3   Probiotic Product (ADVANCED PROBIOTIC PO) Take 1 capsule by mouth daily.     rosuvastatin (CRESTOR) 40 MG tablet Take 1 tablet (40 mg total) by mouth at bedtime. 90 tablet 3   Specialty Vitamins Products (ONE-A-DAY BONE STRENGTH PO) Take 1 tablet by mouth daily.      triamcinolone cream (KENALOG) 0.1 % Apply 1 Application topically 2 (two) times daily. For 2 weeks, then as needed 45 g 1   No current facility-administered medications for this visit.    PHYSICAL EXAMINATION: ECOG PERFORMANCE STATUS: 0 - Asymptomatic  Vitals:   09/19/22 1221  BP: 122/60  Pulse: (!) 58  Resp: 18  Temp: 97.8 F (36.6 C)  SpO2: 96%   Wt Readings from Last 3 Encounters:  09/19/22 153 lb 4.8 oz (69.5 kg)  09/14/22 155 lb (70.3 kg)  07/12/22 153 lb 12.8 oz (69.8 kg)    GENERAL:alert, no distress and comfortable SKIN: skin color, texture, turgor are normal, no rashes or significant lesions EYES: normal, Conjunctiva are pink and non-injected, sclera clear NECK: supple, thyroid normal size, non-tender, without nodularity LYMPH:  (-) no palpable lymphadenopathy in the cervical, axillary  LUNGS: clear to auscultation and percussion with normal breathing effort HEART: regular rate & rhythm and no murmurs and no lower extremity edema ABDOMEN:abdomen soft, non-tender and normal bowel sounds Musculoskeletal:no cyanosis of digits and no clubbing  NEURO: alert & oriented x 3 with fluent speech, no focal motor/sensory deficits BREAST: RT breast , LT BREAST Lumpectomy, firm,no palpable mass breast exam benign.     LABORATORY DATA:  I have reviewed the data as listed     Latest Ref Rng & Units 09/19/2022   11:20 AM 05/20/2022   11:21 AM 08/18/2021   12:34 PM  CBC  WBC 4.0 - 10.5 K/uL 5.5  5.5  7.0   Hemoglobin 12.0 - 15.0 g/dL 15.1  14.3  14.5   Hematocrit 36.0 - 46.0 % 42.2  41.0  40.3   Platelets 150 - 400 K/uL 254  258  276         Latest Ref Rng & Units 09/19/2022   11:20 AM 07/06/2022   10:26 AM 05/20/2022   11:21 AM  CMP  Glucose 70 - 99 mg/dL 97  111  104   BUN 8 - 23 mg/dL '17  16  18   '$ Creatinine 0.44 - 1.00 mg/dL 1.06  1.00  1.01   Sodium 135 - 145 mmol/L 136  139  137   Potassium 3.5 - 5.1 mmol/L 3.7  3.8  3.8   Chloride 98 - 111 mmol/L 101  100  104   CO2 22 - 32 mmol/L '27  24  27   '$ Calcium 8.9 - 10.3 mg/dL 10.2  10.0  9.9   Total Protein 6.5 - 8.1 g/dL 6.7   6.8   Total Bilirubin 0.3 - 1.2 mg/dL 1.1   1.2   Alkaline Phos 38 - 126 U/L 92   88   AST 15 - 41 U/L 24   22   ALT 0 - 44 U/L 21   22       RADIOGRAPHIC STUDIES: I have personally reviewed the radiological images as listed and agreed with the findings in the report. No results found.    Orders Placed This Encounter  Procedures   MM DIAG BREAST TOMO BILATERAL    Standing Status:   Future    Standing Expiration Date:   09/20/2023    Order Specific Question:   Reason for Exam (SYMPTOM  OR DIAGNOSIS REQUIRED)    Answer:   screening    Order Specific Question:   Preferred imaging location?    Answer:   [facility]   All questions were answered. The patient knows to call the clinic with any problems, questions or concerns. No barriers to learning was detected. The total time spent in the appointment was 30 minutes.     Truitt Merle, MD 09/19/2022   Felicity Coyer, CMA, am acting as scribe for Truitt Merle, MD.   I have reviewed the above documentation for accuracy and completeness, and I agree with the above.

## 2022-09-20 ENCOUNTER — Telehealth: Payer: Self-pay

## 2022-09-20 NOTE — Telephone Encounter (Signed)
Called Pt back to discuss Vit D3. Per Wilber Bihari, NP, Pt can take 4000 IU daily. Pt verbalized understanding.

## 2022-09-20 NOTE — Telephone Encounter (Signed)
Called Pt to discuss Vit D result. Pt states she is taking VitD3 2000IU daily. Will relay to NP and be back in touch with Pt with any other recommendations.

## 2022-09-20 NOTE — Telephone Encounter (Signed)
-----   Message from Gardenia Phlegm, NP sent at 09/20/2022  9:00 AM EST ----- Vitamin d is low normal, I recommend that she get over the counter Vitamin D3, 1000 IU oral daily ----- Message ----- From: Interface, Lab In [place] Sent: 09/19/2022  11:33 AM EST To: Gardenia Phlegm, NP

## 2022-09-21 DIAGNOSIS — G5602 Carpal tunnel syndrome, left upper limb: Secondary | ICD-10-CM | POA: Diagnosis not present

## 2022-09-29 DIAGNOSIS — M65332 Trigger finger, left middle finger: Secondary | ICD-10-CM | POA: Diagnosis not present

## 2022-09-29 DIAGNOSIS — M79642 Pain in left hand: Secondary | ICD-10-CM | POA: Diagnosis not present

## 2022-09-29 DIAGNOSIS — M720 Palmar fascial fibromatosis [Dupuytren]: Secondary | ICD-10-CM | POA: Diagnosis not present

## 2022-09-29 DIAGNOSIS — G5602 Carpal tunnel syndrome, left upper limb: Secondary | ICD-10-CM | POA: Diagnosis not present

## 2022-09-29 DIAGNOSIS — R52 Pain, unspecified: Secondary | ICD-10-CM | POA: Diagnosis not present

## 2022-10-03 ENCOUNTER — Other Ambulatory Visit: Payer: Self-pay

## 2022-10-03 ENCOUNTER — Inpatient Hospital Stay: Payer: Medicare Other

## 2022-10-03 VITALS — BP 124/69 | HR 57 | Temp 97.7°F | Resp 17 | Wt 155.5 lb

## 2022-10-03 DIAGNOSIS — C50812 Malignant neoplasm of overlapping sites of left female breast: Secondary | ICD-10-CM | POA: Diagnosis not present

## 2022-10-03 DIAGNOSIS — Z170 Estrogen receptor positive status [ER+]: Secondary | ICD-10-CM | POA: Diagnosis not present

## 2022-10-03 DIAGNOSIS — M8580 Other specified disorders of bone density and structure, unspecified site: Secondary | ICD-10-CM

## 2022-10-03 DIAGNOSIS — Z79810 Long term (current) use of selective estrogen receptor modulators (SERMs): Secondary | ICD-10-CM | POA: Diagnosis not present

## 2022-10-03 DIAGNOSIS — I10 Essential (primary) hypertension: Secondary | ICD-10-CM | POA: Diagnosis not present

## 2022-10-03 DIAGNOSIS — M810 Age-related osteoporosis without current pathological fracture: Secondary | ICD-10-CM | POA: Diagnosis not present

## 2022-10-03 DIAGNOSIS — Z90710 Acquired absence of both cervix and uterus: Secondary | ICD-10-CM | POA: Diagnosis not present

## 2022-10-03 MED ORDER — SODIUM CHLORIDE 0.9 % IV SOLN: Freq: Once | INTRAVENOUS | Status: AC

## 2022-10-03 MED ORDER — ZOLEDRONIC ACID 4 MG/100ML IV SOLN
4.0000 mg | Freq: Once | INTRAVENOUS | Status: AC
  Administered 2022-10-03: 4 mg via INTRAVENOUS
  Filled 2022-10-03: qty 100

## 2022-10-03 NOTE — Patient Instructions (Signed)

## 2022-10-04 NOTE — Telephone Encounter (Signed)
Joni Reining, RN  Supple, Megan E, RPH-CPP2 weeks ago    Spoke with patient and she agrees to plan. Visit w/ Dr. Burr Medico is 09/19/22 and she will get back to [place] after that.  Sharl Ma, Megan E, RPH-CPP  [place], Erica L, RN2 weeks ago    Would defer to Dr Burr Medico for osteoporosis medication of choice. She can let us know what the drug name is though when she starts on it, and we can double check to make sure there isn't an issue with her other medications.

## 2022-10-17 DIAGNOSIS — M65332 Trigger finger, left middle finger: Secondary | ICD-10-CM | POA: Diagnosis not present

## 2022-10-17 DIAGNOSIS — G5602 Carpal tunnel syndrome, left upper limb: Secondary | ICD-10-CM | POA: Diagnosis not present

## 2022-10-31 DIAGNOSIS — M79642 Pain in left hand: Secondary | ICD-10-CM | POA: Diagnosis not present

## 2022-11-14 DIAGNOSIS — Z85828 Personal history of other malignant neoplasm of skin: Secondary | ICD-10-CM | POA: Diagnosis not present

## 2022-11-14 DIAGNOSIS — D1801 Hemangioma of skin and subcutaneous tissue: Secondary | ICD-10-CM | POA: Diagnosis not present

## 2022-11-14 DIAGNOSIS — Z85820 Personal history of malignant melanoma of skin: Secondary | ICD-10-CM | POA: Diagnosis not present

## 2022-11-14 DIAGNOSIS — Z1283 Encounter for screening for malignant neoplasm of skin: Secondary | ICD-10-CM | POA: Diagnosis not present

## 2022-11-14 DIAGNOSIS — L568 Other specified acute skin changes due to ultraviolet radiation: Secondary | ICD-10-CM | POA: Diagnosis not present

## 2022-11-14 DIAGNOSIS — L821 Other seborrheic keratosis: Secondary | ICD-10-CM | POA: Diagnosis not present

## 2022-11-14 DIAGNOSIS — Z08 Encounter for follow-up examination after completed treatment for malignant neoplasm: Secondary | ICD-10-CM | POA: Diagnosis not present

## 2023-01-16 ENCOUNTER — Telehealth: Payer: Self-pay | Admitting: Family Medicine

## 2023-01-16 ENCOUNTER — Other Ambulatory Visit: Payer: Self-pay

## 2023-01-16 MED ORDER — FUROSEMIDE 40 MG PO TABS: ORAL_TABLET | ORAL | 3 refills | Status: DC

## 2023-01-16 NOTE — Telephone Encounter (Signed)
Prescription Request  01/16/2023  LOV: 07/12/2022  What is the name of the medication or equipment? furosemide (LASIX) 40 MG tablet   Have you contacted your pharmacy to request a refill? Yes   Which pharmacy would you like this sent to?   [facility] - [place], [place] - [address] [place] [address] Phone: [phone] Fax: [phone]    Patient notified that their request is being sent to the clinical staff for review and that they should receive a response within 2 business days.   Please advise at Mobile [phone] (mobile)  Per Rocky Link, pt requested 20 mg of furosemide but they only have 40 mg as being prescribed in the past.

## 2023-01-16 NOTE — Telephone Encounter (Signed)
Rx sent 

## 2023-01-19 ENCOUNTER — Ambulatory Visit: Payer: Medicare Other | Admitting: Internal Medicine

## 2023-01-31 ENCOUNTER — Ambulatory Visit
Admission: RE | Admit: 2023-01-31 | Discharge: 2023-01-31 | Disposition: A | Discharge status: Home / Self Care | Payer: Medicare Other | Source: Ambulatory Visit | Attending: Hematology | Admitting: Hematology

## 2023-01-31 DIAGNOSIS — R92323 Mammographic fibroglandular density, bilateral breasts: Secondary | ICD-10-CM | POA: Diagnosis not present

## 2023-01-31 DIAGNOSIS — C50812 Malignant neoplasm of overlapping sites of left female breast: Secondary | ICD-10-CM

## 2023-01-31 HISTORY — DX: Personal history of irradiation: Z92.3

## 2023-02-09 ENCOUNTER — Telehealth: Payer: Self-pay

## 2023-02-09 ENCOUNTER — Ambulatory Visit: Payer: Medicare Other | Attending: Internal Medicine | Admitting: Internal Medicine

## 2023-02-09 ENCOUNTER — Encounter: Payer: Self-pay | Admitting: Internal Medicine

## 2023-02-09 VITALS — BP 132/60 | HR 69 | Ht 60.0 in | Wt 151.0 lb

## 2023-02-09 DIAGNOSIS — I10 Essential (primary) hypertension: Secondary | ICD-10-CM | POA: Diagnosis not present

## 2023-02-09 DIAGNOSIS — R0609 Other forms of dyspnea: Secondary | ICD-10-CM | POA: Diagnosis not present

## 2023-02-09 DIAGNOSIS — I4719 Other supraventricular tachycardia: Secondary | ICD-10-CM | POA: Diagnosis not present

## 2023-02-09 DIAGNOSIS — I2510 Atherosclerotic heart disease of native coronary artery without angina pectoris: Secondary | ICD-10-CM | POA: Diagnosis not present

## 2023-02-09 NOTE — Telephone Encounter (Signed)
-----   Message from Meriam Sprague, MD sent at 02/09/2023  1:22 PM EDT ----- Regarding: RE: Echocardiogram prior to July appt with Dr. Mayford Knife? Creta Levin,  It looks like her echo was reassuring in 03/2022 with normal pumping function, mild to moderate TR and normal PA pressures. There is nothing on that echo that needs to be repeated now but if she is having worsening symptoms (shortness of breath, swelling, chest discomfort etc), it would be worth repeating the echo prior to her appointment to ensure there is no interval change. ----- Message ----- From: Luellen Pucker, RN Sent: 02/09/2023  11:15 AM EDT To: Quintella Reichert, MD Subject: FW: Echocardiogram prior to July appt with D#  Patient's appt is in September, FYI ----- Message ----- From: Elmore Guise, RN Sent: 02/09/2023   9:01 AM EDT To: Luellen Pucker, RN Subject: Echocardiogram prior to July appt with Dr. Karna Dupes,  Dr. Ladona Ridgel just saw this Pt in clinic this morning.  He ordered a Cardiopulmonary stress test.  The Pt wanted to know if Dr. Mayford Knife was going to order / schedule an Echocardiogram prior to her appt in July with Dr. Mayford Knife?  I advised Pt that I would ask you and Dr. Mayford Knife.  Thank you as always! Weston Brass RN

## 2023-02-09 NOTE — Telephone Encounter (Signed)
Faxed signed order for mastectomy products to [facility] as per Dr. Mosetta Putt. Received fax confirmation of receipt and placed original copy in the to be scanned file.

## 2023-02-09 NOTE — Telephone Encounter (Signed)
Per Dr. Shari Prows, because patient has new symptoms patient was called to discuss order for Echo. Patient saw Dr. Ladona Ridgel on 02/09/23 to discuss occasional palpitations and dyspnea on exertion. Next appt w/ Dr. Mayford Knife 05/18/23. Echo order placed, called patient to advise that someone will call her to schedule.

## 2023-02-09 NOTE — Progress Notes (Signed)
HPI Madison Clements returns for ongoing evaluation of SVT. She is a pleasant 77 yo woman with HTN, non-obstructive CAD, mild/mod pulmonary HTN and breast CA. She has occaisional palpitations. She has worn a cardiac monitor which has demonstrated NS AT, PAC's and PVC's. No pauses. She denies syncope and her palpitations are not sustained. She was placed on low dose toprol in addition to the cardizem. The toprol was stopped. Since then she has had worsening sob with exertion. She also has fatigue. She feels palpitations but these are not associated with sob.  Allergies  Allergen Reactions   Morphine Rash and Nausea And Vomiting   Morphine And Codeine Hives, Dermatitis and Rash     Current Outpatient Medications  Medication Sig Dispense Refill   celecoxib (CELEBREX) 100 MG capsule TAKE 1 CAPSULE EVERY DAY 90 capsule 3   Cholecalciferol (VITAMIN D3) 50 MCG (2000 UT) capsule Take 2,000 Units by mouth daily. Pt takes every other day     diclofenac Sodium (VOLTAREN) 1 % GEL      diltiazem (CARDIZEM CD) 240 MG 24 hr capsule Take 1 capsule (240 mg total) by mouth daily. 90 capsule 3   folic acid (FOLVITE) 400 MCG tablet Take 400 mcg by mouth daily.     furosemide (LASIX) 40 MG tablet Take 1 tablet (40 mg total) daily. Take an extra 1/2 tablet (20 mg total) as needed. 135 tablet 3   lisinopril (ZESTRIL) 10 MG tablet Take 1 tablet (10 mg total) by mouth daily. 90 tablet 3   omeprazole (PRILOSEC) 20 MG capsule TAKE 1 CAPSULE EVERY DAY 90 capsule 3   Probiotic Product (ADVANCED PROBIOTIC PO) Take 1 capsule by mouth daily.     rosuvastatin (CRESTOR) 40 MG tablet Take 1 tablet (40 mg total) by mouth at bedtime. 90 tablet 3   Specialty Vitamins Products (ONE-A-DAY BONE STRENGTH PO) Take 1 tablet by mouth daily.      triamcinolone cream (KENALOG) 0.1 % Apply 1 Application topically 2 (two) times daily. For 2 weeks, then as needed 45 g 1   No current facility-administered medications for this visit.      Past Medical History:  Diagnosis Date   Allergy 1990   Morphine following surgery   Arthritis    generalized   Atypical mole 02/24/2015   RGHT LATERAL THIGH MODERATE   Atypical mole 01/20/2022   Left Breast (mild)   Atypical mole 01/20/2022   Mid Back (mild)   Atypical nevi 02/24/2015   RIGHT NECK MILD   Atypical nevi 08/25/2015   LEFT UPPER ARM MILD   Atypical nevi 08/25/2015   LEFT LATERAL FOREARM MILD/FREE   Atypical nevi 02/23/2016   LEFT THIGH MODERATE/FREE   Atypical nevi 02/23/2016   LEFT MEDIAL LEG MODERATE/FREE   Atypical nevi 02/23/2016   LEFT UPPER BACK MILD /FREE   Atypical nevi 08/23/2016   RIGHT ANT PROX THIGH MILD /FREE   Atypical nevi 07/25/2018   MID LOWER BACK MODERATE   Atypical nevi 01/22/2019   LEFT MID BACK MILD /FREE   Atypical nevi 01/22/2019   LEFT NECK MILD   Atypical nevi 08/06/2019   LEFT INNER BREAST MODERATE W/S   Atypical nevi 08/06/2019   LEFT OUTER SIDE MILD/FREE   Blood transfusion without reported diagnosis    hx of   Breast cancer (HCC) 2022   CAD (coronary artery disease), native coronary artery 02/07/2019   Minimal CAD at the time of cath in 2016 in [facility]  [place]   Family history of breast cancer 08/19/2021   Family history of pancreatic cancer 08/19/2021   Fatty liver 05/21/2020   Gastric polyps 10/02/2018   EGD 03/2015; benign   GERD (gastroesophageal reflux disease) 10/02/2018   on meds   History of melanoma 10/02/2018   Abdomen 2012; 2013; s/p local excisions.    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 DISTAL LOWER  LEG TREATED BY DR. Leda Quail   Sleep apnea    Sleep apnea 05/26/2020   uses CPAP    ROS:   All systems reviewed and negative except as noted in the HPI.   Past Surgical History:  Procedure Laterality Date   ABDOMINAL HYSTERECTOMY  1990   APPENDECTOMY  1962   BLADDER SUSPENSION  1990   BREAST LUMPECTOMY WITH RADIOACTIVE SEED LOCALIZATION Left 09/09/2021   Procedure: LEFT BREAST LUMPECTOMY WITH RADIOACTIVE SEED LOCALIZATION X2;  Surgeon: Manus Rudd, MD;  Location: [facility];  Service: General;  Laterality: Left;   COLONOSCOPY  2016   in [place]-hx of polyps   EYE SURGERY  2013 torn retina laser   MENISCUS REPAIR Right 2001   right knee   thumb surgery Left    TONSILLECTOMY AND ADENOIDECTOMY     tuabl ligation     TUBAL LIGATION  1975   WISDOM TOOTH EXTRACTION       Family History  Problem Relation Age of Onset   Arthritis Mother    High blood pressure Mother    Hyperlipidemia Mother    Hypertension Mother    Stroke Mother    Varicose Veins Mother    Heart attack Father    Heart disease Father    High blood pressure Father    Hypertension Father    Arthritis Sister    Diabetes Sister    High Cholesterol Sister    High blood pressure Sister    Breast cancer Sister 31   Varicose Veins Sister    Pancreatic cancer Maternal Aunt        dx early 66s   Heart disease Maternal Uncle    Heart disease Maternal Uncle    Stroke Paternal Aunt    Early death Maternal Grandmother        Stroke at 21   High blood pressure Maternal Grandmother    Hypertension Maternal Grandmother    Stroke Maternal Grandmother    Asthma Maternal Grandfather    Kidney disease Maternal Grandfather    Arthritis Paternal Grandmother    Heart disease Paternal Grandmother    High blood pressure Paternal Grandmother    Hypertension Paternal Grandmother    Kidney disease Paternal Grandfather    Asthma Paternal Grandfather    COPD Paternal Grandfather    Prostate cancer Cousin         paternal cousins x2; dx after 85   Colon polyps Neg Hx    Colon cancer Neg Hx    Esophageal cancer Neg Hx    Rectal cancer Neg Hx    Stomach cancer Neg Hx  Social History   Socioeconomic History   Marital status: Widowed    Spouse name: Not on file   Number of children: 1   Years of education: Not on file   Highest education level: Not on file  Occupational History   Occupation: Retired [employer]  Tobacco Use   Smoking status: Former    Packs/day: 0.25    Years: 10.00    Additional pack years: 0.00    Total pack years: 2.50    Types: Cigarettes    Quit date: 09/12/1996    Years since quitting: 26.4   Smokeless tobacco: Never  Vaping Use   Vaping Use: Never used  Substance and Sexual Activity   Alcohol use: Yes    Alcohol/week: 10.0 standard drinks of alcohol    Types: 5 Glasses of wine, 5 Shots of liquor per week    Comment: occassional    Drug use: Never   Sexual activity: Not Currently    Birth control/protection: Abstinence, Post-menopausal    Comment: hysterectomy  Other Topics Concern   Not on file  Social History Narrative   Widowed 2018 after 40 years of marriage   One daughter, 2 grandchildren. Lives in [place]   Sister lives in same complex in [place]    Previously lived in [place], [place]   Social Determinants of Health   Financial Resource Strain: Low Risk  (12/28/2021)   Overall Financial Resource Strain (CARDIA)    Difficulty of Paying Living Expenses: Not hard at all  Food Insecurity: No Food Insecurity (06/06/2022)   Hunger Vital Sign    Worried About Running Out of Food in the Last Year: Never true    Ran Out of Food in the Last Year: Never true  Transportation Needs: No Transportation Needs (06/06/2022)   PRAPARE - Administrator, Civil Service (Medical): No    Lack of Transportation (Non-Medical): No  Physical Activity: Insufficiently Active (12/28/2021)   Exercise Vital Sign    Days of Exercise per Week: 5 days    Minutes of  Exercise per Session: 20 min  Stress: No Stress Concern Present (12/28/2021)   [employer] of Occupational Health - Occupational Stress Questionnaire    Feeling of Stress : Not at all  Social Connections: Moderately Integrated (12/28/2021)   Social Connection and Isolation Panel [NHANES]    Frequency of Communication with Friends and Family: More than three times a week    Frequency of Social Gatherings with Friends and Family: More than three times a week    Attends Religious Services: 1 to 4 times per year    Active Member of [employer] or Organizations: Yes    Attends Banker Meetings: 1 to 4 times per year    Marital Status: Widowed  Intimate Partner Violence: Not At Risk (12/28/2021)   Humiliation, Afraid, Rape, and Kick questionnaire    Fear of Current or Ex-Partner: No    Emotionally Abused: No    Physically Abused: No    Sexually Abused: No     BP 132/60   Pulse 69   Ht 5' (1.524 m)   Wt 151 lb (68.5 kg)   SpO2 98%   BMI 29.49 kg/m   Physical Exam:  Well appearing NAD HEENT: Unremarkable Neck:  No JVD, no thyromegally Lymphatics:  No adenopathy Back:  No CVA tenderness Lungs:  Clear with no wheezes HEART:  Regular rate rhythm, no murmurs, no rubs, no clicks Abd:  soft, positive bowel sounds, no organomegally,  no rebound, no guarding Ext:  2 plus pulses, no edema, no cyanosis, no clubbing Skin:  No rashes no nodules Neuro:  CN II through XII intact, motor grossly intact  EKG nsr   Assess/Plan:  AT - I discussed the benign nature of her arrhythmias at least based on her symptoms and her heart monitor. I have recommended she continue her cardizem. Fatigue/dyspnea with exertion - this was worsened and I have asked her to undergo CPX testing. HTN - her bp is controlled. We will follow. CAD - she denies any anginal symptoms. No other change.    Sharlot Gowda Sarahelizabeth Conway,MD

## 2023-02-09 NOTE — Patient Instructions (Addendum)
Medication Instructions:  Your physician recommends that you continue on your current medications as directed. Please refer to the Current Medication list given to you today.  *If you need a refill on your cardiac medications before your next appointment, please call your pharmacy*  Lab Work: None ordered.  If you have labs (blood work) drawn today and your tests are completely normal, you will receive your results only by: MyChart Message (if you have MyChart) OR A paper copy in the mail If you have any lab test that is abnormal or we need to change your treatment, we will call you to review the results.  Testing/Procedures: Dr. Lewayne Bunting has ordered a Cardiopulmonary Exercise / Stress Test.  Scheduling will reach out to you in the near future.   Follow-Up: At [facility], you and your health needs are our priority.  As part of our continuing mission to provide you with exceptional heart care, we have created designated Provider Care Teams.  These Care Teams include your primary Cardiologist (physician) and Advanced Practice Providers (APPs -  Physician Assistants and Nurse Practitioners) who all work together to provide you with the care you need, when you need it.  Your next appointment:   Follow up will be based on your Cardiopulmonary Exercise Stress test results, per Dr. Lewayne Bunting   The format for your next appointment:   In Person  Provider:   Lewayne Bunting, MD{or one of the following Advanced Practice Providers on your designated Care Team:   Francis Dowse, [place] Casimiro Needle "Mardelle Matte" Lanna Poche, [place]   Cardiopulmonary Exercise Stress Test Cardiopulmonary exercise testing (CPET) is a test that is used to evaluate how well your heart and lungs are able to respond to exercise. This is called your exercise capacity. During this test, you will walk or run on a treadmill or pedal on a stationary bike while tests are done on your heart and lungs. This test may be done to  check: Unexplained shortness of breath. Heart or lung problems. Exercise tolerance. This is done for people who have heart failure or coronary artery disease (CAD). Lung function. This is done for people who have chronic obstructive pulmonary disease (COPD), pulmonary hypertension, or cystic fibrosis. Heart function. This is useful for people who have heart disease or heart failure. Response to a cardiac or pulmonary rehabilitation program. Tell a health care provider about: Any allergies you have. All medicines you are taking, including vitamins, herbs, eye drops, creams, and over-the-counter medicines. Any bleeding problems you have. Any surgeries you have had, especially if you have an implantable cardioverter defibrillator (ICD) or pacemaker. Any medical conditions you have. Whether you are pregnant or may be pregnant. What are the risks? Generally, this is a safe test. However, problems may occur, including: Chest pain. Shortness of breath. Dizziness. Leg pain. Irregular heartbeat. What happens before the test? Tell your health care provider about all the medicines you are taking. You may be told to change or stop some of your medicines. If you have diabetes, ask how you should take your insulin or diabetes pills. Follow instructions from your health care provider about eating or drinking restrictions. Wear loose-fitting clothing and comfortable shoes. If you use an inhaler, bring it with you to the test. What happens during the test?  A blood pressure cuff will be placed on your arm. Several stick-on patches (electrodes) will be placed on your chest and attached to an electrocardiogram (EKG) machine. A clip-on monitor that measures the amount of oxygen in  your blood will be placed on your finger (pulse oximeter). A clip will be placed on your nose and a mouthpiece will be placed in your mouth. This may be held in place with a headpiece. You will breathe through the mouthpiece  during the test. You will be asked to start exercising either on a stationary bicycle or on a treadmill. You will be closely supervised during exercise. The amount of effort for your exercise will be gradually increased. During exercise, the test will measure: Your heart rate. Your heart rhythm. Your blood oxygen level. The amount of oxygen you breathe in and the amount of carbon dioxide you breathe out through your mouthpiece. The test will end when: You have finished the test. You have reached your maximum ability to exercise. You have chest or leg pain, dizziness, or shortness of breath. The test may also be stopped if you have abnormal changes in your blood pressure or develop an irregular heartbeat (arrhythmia). The procedure may vary among health care providers and hospitals. What can I expect after the test? Your blood pressure, heart rate, breathing rate, and blood oxygen level will be monitored until you leave the hospital or clinic. It is up to you to get the results of your test. Ask your health care provider, or the department that is doing the test, when your results will be ready. Follow any aftercare instructions given to you by the hospital or clinic. Summary Cardiopulmonary exercise testing (CPET) is a test that measures how well your heart and lungs are able to respond to exercise. Follow your health care provider's instructions about food and drink, and what medicines to change or stop. During this test, you will walk or run on a treadmill or pedal on a stationary bike while tests are done on your heart and lungs. The procedure may vary among health care providers and hospitals. This information is not intended to replace advice given to you by your health care provider. Make sure you discuss any questions you have with your health care provider. Document Revised: 07/13/2021 Document Reviewed: 07/13/2021 Elsevier Patient Education  2024 [employer].

## 2023-02-10 DIAGNOSIS — M1711 Unilateral primary osteoarthritis, right knee: Secondary | ICD-10-CM | POA: Diagnosis not present

## 2023-02-13 ENCOUNTER — Telehealth: Payer: Self-pay

## 2023-02-13 NOTE — Telephone Encounter (Signed)
   Pre-operative Risk Assessment    Patient Name: Madison Clements  DOB: 1945-12-11 MRN: [id number]     Request for Surgical Clearance    Procedure:  Right total knee arthoplasty   Date of Surgery:  Clearance 06/12/23                                 Surgeon:  Dr. Ollen Gross Surgeon's Group or Practice Name:  [facility] Phone number:  [phone] Fax number:  [phone]   Type of Clearance Requested:   - Medical    Type of Anesthesia:  Choice    Additional requests/questions:    SignedVernard Gambles   02/13/2023, 10:34 AM

## 2023-02-15 ENCOUNTER — Ambulatory Visit (HOSPITAL_COMMUNITY): Payer: Medicare Other | Attending: Cardiology

## 2023-02-15 DIAGNOSIS — R0600 Dyspnea, unspecified: Secondary | ICD-10-CM | POA: Diagnosis not present

## 2023-02-15 DIAGNOSIS — R0609 Other forms of dyspnea: Secondary | ICD-10-CM

## 2023-02-16 ENCOUNTER — Telehealth: Payer: Self-pay

## 2023-02-16 NOTE — Telephone Encounter (Signed)
Called patient to review Echo and appt w/ Dr. Mayford Knife, patient states she has no conflicts with those dates and was just confused about timing of stress test, echo and follow up. Patient verbalizes understanding of testing and follow up days/times.

## 2023-02-16 NOTE — Telephone Encounter (Signed)
-----   Message from Quintella Reichert, MD sent at 02/15/2023  8:31 PM EDT ----- Regarding: RE: Echocardiogram prior to July appt with Dr. Mayford Knife? Leave as is ----- Message ----- From: Luellen Pucker, RN Sent: 02/15/2023   4:31 PM EDT To: Quintella Reichert, MD Subject: RE: Echocardiogram prior to July appt with D#  I don't know if she changed it or something but it's showing that it's in August right now. Echo appt 8/28 and appt with you is 9/5. If that sounds good to you I will let patient know to leave appts as they are.  ----- Message ----- From: Quintella Reichert, MD Sent: 02/11/2023   3:10 PM EDT To: Luellen Pucker, RN Subject: RE: Echocardiogram prior to July appt with D#  It looks like she already has an appt for her echo in July 2024 ----- Message ----- From: Luellen Pucker, RN Sent: 02/09/2023  11:15 AM EDT To: Quintella Reichert, MD Subject: FW: Echocardiogram prior to July appt with D#  Patient's appt is in September, FYI ----- Message ----- From: Elmore Guise, RN Sent: 02/09/2023   9:01 AM EDT To: Luellen Pucker, RN Subject: Echocardiogram prior to July appt with Dr. Karna Dupes,  Dr. Ladona Ridgel just saw this Pt in clinic this morning.  He ordered a Cardiopulmonary stress test.  The Pt wanted to know if Dr. Mayford Knife was going to order / schedule an Echocardiogram prior to her appt in July with Dr. Mayford Knife?  I advised Pt that I would ask you and Dr. Mayford Knife.  Thank you as always! Weston Brass RN

## 2023-02-20 NOTE — Telephone Encounter (Signed)
   Name: Madison Clements  DOB: 1946/08/23  MRN: [id number]  Primary Cardiologist: Armanda Magic, MD  Chart reviewed as part of pre-operative protocol coverage. Because of Madison Clements's past medical history and time since last visit, she will require a follow-up in-office visit in order to better assess preoperative cardiovascular risk.  Pre-op covering staff: - Please schedule appointment and call patient to inform them. If patient already had an upcoming appointment within acceptable timeframe, please add "pre-op clearance" to the appointment notes so provider is aware. - Please contact requesting surgeon's office via preferred method (i.e, phone, fax) to inform them of need for appointment prior to surgery.  -no medication need held.  Sharlene Dory, PA-C  02/20/2023, 7:27 AM

## 2023-03-02 DIAGNOSIS — L570 Actinic keratosis: Secondary | ICD-10-CM | POA: Diagnosis not present

## 2023-03-02 DIAGNOSIS — Z85820 Personal history of malignant melanoma of skin: Secondary | ICD-10-CM | POA: Diagnosis not present

## 2023-03-02 DIAGNOSIS — Z1283 Encounter for screening for malignant neoplasm of skin: Secondary | ICD-10-CM | POA: Diagnosis not present

## 2023-03-02 DIAGNOSIS — D2272 Melanocytic nevi of left lower limb, including hip: Secondary | ICD-10-CM | POA: Diagnosis not present

## 2023-03-02 DIAGNOSIS — Z08 Encounter for follow-up examination after completed treatment for malignant neoplasm: Secondary | ICD-10-CM | POA: Diagnosis not present

## 2023-03-02 DIAGNOSIS — S80862A Insect bite (nonvenomous), left lower leg, initial encounter: Secondary | ICD-10-CM | POA: Diagnosis not present

## 2023-03-02 DIAGNOSIS — D485 Neoplasm of uncertain behavior of skin: Secondary | ICD-10-CM | POA: Diagnosis not present

## 2023-03-02 DIAGNOSIS — L568 Other specified acute skin changes due to ultraviolet radiation: Secondary | ICD-10-CM | POA: Diagnosis not present

## 2023-03-02 DIAGNOSIS — D1801 Hemangioma of skin and subcutaneous tissue: Secondary | ICD-10-CM | POA: Diagnosis not present

## 2023-03-02 DIAGNOSIS — L821 Other seborrheic keratosis: Secondary | ICD-10-CM | POA: Diagnosis not present

## 2023-03-08 ENCOUNTER — Ambulatory Visit: Payer: Medicare Other | Attending: Nurse Practitioner

## 2023-03-08 DIAGNOSIS — E785 Hyperlipidemia, unspecified: Secondary | ICD-10-CM | POA: Diagnosis not present

## 2023-03-08 DIAGNOSIS — I471 Supraventricular tachycardia, unspecified: Secondary | ICD-10-CM

## 2023-03-08 DIAGNOSIS — I10 Essential (primary) hypertension: Secondary | ICD-10-CM

## 2023-03-08 DIAGNOSIS — I2510 Atherosclerotic heart disease of native coronary artery without angina pectoris: Secondary | ICD-10-CM | POA: Diagnosis not present

## 2023-03-08 LAB — LIPID PANEL
Chol/HDL Ratio: 2.7 ratio (ref 0.0–4.4)
Cholesterol, Total: 151 mg/dL (ref 100–199)
HDL: 55 mg/dL (ref >39)
LDL Chol Calc (NIH): 61 mg/dL (ref 0–99)
Triglycerides: 218 mg/dL — ABNORMAL HIGH (ref 0–149)
VLDL Cholesterol Cal: 35 mg/dL (ref 5–40)

## 2023-03-21 ENCOUNTER — Ambulatory Visit (INDEPENDENT_AMBULATORY_CARE_PROVIDER_SITE_OTHER): Payer: Medicare Other

## 2023-03-21 VITALS — Wt 151.0 lb

## 2023-03-21 DIAGNOSIS — Z0000 Encounter for general adult medical examination without abnormal findings: Secondary | ICD-10-CM

## 2023-03-21 NOTE — Patient Instructions (Signed)
Madison Clements , Thank you for taking time to come for your Medicare Wellness Visit. I appreciate your ongoing commitment to your health goals. Please review the following plan we discussed and let me know if I can assist you in the future.   These are the goals we discussed:  Goals      DIET - EAT MORE FRUITS AND VEGETABLES     Patient Stated     Continue 5000 steps daily and aim to increase      Patient Stated     Maintain weight and walk more      Patient Stated     Increase activity and conquer fatigue     Patient Stated     None at this time      Track and Manage My Blood Pressure-Hypertension     Timeframe:  Long-Range Goal Priority:  High Start Date:  08/24/21                           Expected End Date:  02/22/22                     Follow Up Date 11/22/21    - check blood pressure weekly - choose a place to take my blood pressure (home, clinic or office, retail store) - write blood pressure results in a log or diary    Why is this important?   You won't feel high blood pressure, but it can still hurt your blood vessels.  High blood pressure can cause heart or kidney problems. It can also cause a stroke.  Making lifestyle changes like losing a little weight or eating less salt will help.  Checking your blood pressure at home and at different times of the day can help to control blood pressure.  If the doctor prescribes medicine remember to take it the way the doctor ordered.  Call the office if you cannot afford the medicine or if there are questions about it.     Notes:         This is a list of the screening recommended for you and due dates:  Health Maintenance  Topic Date Due   DTaP/Tdap/Td vaccine (1 - Tdap) Never done   Zoster (Shingles) Vaccine (1 of 2) Never done   COVID-19 Vaccine (4 - 2023-24 season) 05/13/2022   Flu Shot  04/13/2023   Mammogram  01/31/2024   Medicare Annual Wellness Visit  03/20/2024   DEXA scan (bone density measurement)   05/18/2024   Colon Cancer Screening  08/14/2025   Pneumonia Vaccine  Completed   Hepatitis C Screening  Completed   HPV Vaccine  Aged Out    Advanced directives: copies in chart   Conditions/risks identified: none at this time   Next appointment: Follow up in one year for your annual wellness visit    Preventive Care 65 Years and Older, Female Preventive care refers to lifestyle choices and visits with your health care provider that can promote health and wellness. What does preventive care include? A yearly physical exam. This is also called an annual well check. Dental exams once or twice a year. Routine eye exams. Ask your health care provider how often you should have your eyes checked. Personal lifestyle choices, including: Daily care of your teeth and gums. Regular physical activity. Eating a healthy diet. Avoiding tobacco and drug use. Limiting alcohol use. Practicing safe sex. Taking low-dose aspirin every day. Taking  vitamin and mineral supplements as recommended by your health care provider. What happens during an annual well check? The services and screenings done by your health care provider during your annual well check will depend on your age, overall health, lifestyle risk factors, and family history of disease. Counseling  Your health care provider may ask you questions about your: Alcohol use. Tobacco use. Drug use. Emotional well-being. Home and relationship well-being. Sexual activity. Eating habits. History of falls. Memory and ability to understand (cognition). Work and work Astronomer. Reproductive health. Screening  You may have the following tests or measurements: Height, weight, and BMI. Blood pressure. Lipid and cholesterol levels. These may be checked every 5 years, or more frequently if you are over 19 years old. Skin check. Lung cancer screening. You may have this screening every year starting at age 48 if you have a 30-pack-year history  of smoking and currently smoke or have quit within the past 15 years. Fecal occult blood test (FOBT) of the stool. You may have this test every year starting at age 60. Flexible sigmoidoscopy or colonoscopy. You may have a sigmoidoscopy every 5 years or a colonoscopy every 10 years starting at age 42. Hepatitis C blood test. Hepatitis B blood test. Sexually transmitted disease (STD) testing. Diabetes screening. This is done by checking your blood sugar (glucose) after you have not eaten for a while (fasting). You may have this done every 1-3 years. Bone density scan. This is done to screen for osteoporosis. You may have this done starting at age 43. Mammogram. This may be done every 1-2 years. Talk to your health care provider about how often you should have regular mammograms. Talk with your health care provider about your test results, treatment options, and if necessary, the need for more tests. Vaccines  Your health care provider may recommend certain vaccines, such as: Influenza vaccine. This is recommended every year. Tetanus, diphtheria, and acellular pertussis (Tdap, Td) vaccine. You may need a Td booster every 10 years. Zoster vaccine. You may need this after age 7. Pneumococcal 13-valent conjugate (PCV13) vaccine. One dose is recommended after age 43. Pneumococcal polysaccharide (PPSV23) vaccine. One dose is recommended after age 25. Talk to your health care provider about which screenings and vaccines you need and how often you need them. This information is not intended to replace advice given to you by your health care provider. Make sure you discuss any questions you have with your health care provider. Document Released: 09/25/2015 Document Revised: 05/18/2016 Document Reviewed: 06/30/2015 Elsevier Interactive Patient Education  2017 [employer].  Fall Prevention in the Home Falls can cause injuries. They can happen to people of all ages. There are many things you can do to  make your home safe and to help prevent falls. What can I do on the outside of my home? Regularly fix the edges of walkways and driveways and fix any cracks. Remove anything that might make you trip as you walk through a door, such as a raised step or threshold. Trim any bushes or trees on the path to your home. Use bright outdoor lighting. Clear any walking paths of anything that might make someone trip, such as rocks or tools. Regularly check to see if handrails are loose or broken. Make sure that both sides of any steps have handrails. Any raised decks and porches should have guardrails on the edges. Have any leaves, snow, or ice cleared regularly. Use sand or salt on walking paths during winter. Clean up any  spills in your garage right away. This includes oil or grease spills. What can I do in the bathroom? Use night lights. Install grab bars by the toilet and in the tub and shower. Do not use towel bars as grab bars. Use non-skid mats or decals in the tub or shower. If you need to sit down in the shower, use a plastic, non-slip stool. Keep the floor dry. Clean up any water that spills on the floor as soon as it happens. Remove soap buildup in the tub or shower regularly. Attach bath mats securely with double-sided non-slip rug tape. Do not have throw rugs and other things on the floor that can make you trip. What can I do in the bedroom? Use night lights. Make sure that you have a light by your bed that is easy to reach. Do not use any sheets or blankets that are too big for your bed. They should not hang down onto the floor. Have a firm chair that has side arms. You can use this for support while you get dressed. Do not have throw rugs and other things on the floor that can make you trip. What can I do in the kitchen? Clean up any spills right away. Avoid walking on wet floors. Keep items that you use a lot in easy-to-reach places. If you need to reach something above you, use a  strong step stool that has a grab bar. Keep electrical cords out of the way. Do not use floor polish or wax that makes floors slippery. If you must use wax, use non-skid floor wax. Do not have throw rugs and other things on the floor that can make you trip. What can I do with my stairs? Do not leave any items on the stairs. Make sure that there are handrails on both sides of the stairs and use them. Fix handrails that are broken or loose. Make sure that handrails are as long as the stairways. Check any carpeting to make sure that it is firmly attached to the stairs. Fix any carpet that is loose or worn. Avoid having throw rugs at the top or bottom of the stairs. If you do have throw rugs, attach them to the floor with carpet tape. Make sure that you have a light switch at the top of the stairs and the bottom of the stairs. If you do not have them, ask someone to add them for you. What else can I do to help prevent falls? Wear shoes that: Do not have high heels. Have rubber bottoms. Are comfortable and fit you well. Are closed at the toe. Do not wear sandals. If you use a stepladder: Make sure that it is fully opened. Do not climb a closed stepladder. Make sure that both sides of the stepladder are locked into place. Ask someone to hold it for you, if possible. Clearly mark and make sure that you can see: Any grab bars or handrails. First and last steps. Where the edge of each step is. Use tools that help you move around (mobility aids) if they are needed. These include: Canes. Walkers. Scooters. Crutches. Turn on the lights when you go into a dark area. Replace any light bulbs as soon as they burn out. Set up your furniture so you have a clear path. Avoid moving your furniture around. If any of your floors are uneven, fix them. If there are any pets around you, be aware of where they are. Review your medicines with your doctor. Some  medicines can make you feel dizzy. This can  increase your chance of falling. Ask your doctor what other things that you can do to help prevent falls. This information is not intended to replace advice given to you by your health care provider. Make sure you discuss any questions you have with your health care provider. Document Released: 06/25/2009 Document Revised: 02/04/2016 Document Reviewed: 10/03/2014 Elsevier Interactive Patient Education  2017 [employer].

## 2023-03-21 NOTE — Progress Notes (Signed)
Subjective:   Madison Clements is a 77 y.o. female who presents for Medicare Annual (Subsequent) preventive examination.  Visit Complete: Virtual  I connected with  [name] on 03/21/23 by a audio enabled telemedicine application and verified that I am speaking with the correct person using two identifiers.  Patient Location: Home  Provider Location: Office/Clinic  I discussed the limitations of evaluation and management by telemedicine. The patient expressed understanding and agreed to proceed.  Patient Medicare AWV questionnaire was completed by the patient on 03/17/23; I have confirmed that all information answered by patient is correct and no changes since this date.  Review of Systems     Cardiac Risk Factors include: advanced age (>2men, >4 women);dyslipidemia;hypertension     Objective:    Today's Vitals   03/21/23 1101  Weight: 151 lb (68.5 kg)   Body mass index is 29.49 kg/m.     03/21/2023   11:06 AM 06/06/2022   11:53 AM 12/28/2021   10:49 AM 10/13/2021    8:48 AM 09/09/2021    7:39 AM 08/31/2021   11:22 AM 09/21/2020   10:22 AM  Advanced Directives  Does Patient Have a Medical Advance Directive? Yes Yes Yes Yes Yes Yes Yes  Type of Estate agent of [place];Living will Living will;Healthcare Power of [employer] Power of Attorney Living will Healthcare Power of [place];Living will Healthcare Power of [place];Living will Healthcare Power of Attorney  Does patient want to make changes to medical advance directive? No - Patient declined    No - Patient declined No - Patient declined   Copy of Healthcare Power of Attorney in Chart? Yes - validated most recent copy scanned in chart (See row information)  Yes - validated most recent copy scanned in chart (See row information)  Yes - validated most recent copy scanned in chart (See row information)  Yes - validated most recent copy scanned in chart (See row information)     Current Medications (verified) Outpatient Encounter Medications as of 03/21/2023  Medication Sig   celecoxib (CELEBREX) 100 MG capsule TAKE 1 CAPSULE EVERY DAY   Cholecalciferol (VITAMIN D3) 50 MCG (2000 UT) capsule Take 2,000 Units by mouth daily. Pt takes every other day   diclofenac Sodium (VOLTAREN) 1 % GEL    diltiazem (CARDIZEM CD) 240 MG 24 hr capsule Take 1 capsule (240 mg total) by mouth daily.   folic acid (FOLVITE) 400 MCG tablet Take 400 mcg by mouth daily.   furosemide (LASIX) 40 MG tablet Take 1 tablet (40 mg total) daily. Take an extra 1/2 tablet (20 mg total) as needed.   lisinopril (ZESTRIL) 10 MG tablet Take 1 tablet (10 mg total) by mouth daily.   omeprazole (PRILOSEC) 20 MG capsule TAKE 1 CAPSULE EVERY DAY   Probiotic Product (ADVANCED PROBIOTIC PO) Take 1 capsule by mouth daily.   rosuvastatin (CRESTOR) 40 MG tablet Take 1 tablet (40 mg total) by mouth at bedtime.   Specialty Vitamins Products (ONE-A-DAY BONE STRENGTH PO) Take 1 tablet by mouth daily.    triamcinolone cream (KENALOG) 0.1 % Apply 1 Application topically 2 (two) times daily. For 2 weeks, then as needed   No facility-administered encounter medications on file as of 03/21/2023.    Allergies (verified) Morphine and Morphine and codeine   History: Past Medical History:  Diagnosis Date   Allergy 1990   Morphine following surgery   Arthritis    generalized   Atypical mole 02/24/2015   RGHT LATERAL  THIGH MODERATE   Atypical mole 01/20/2022   Left Breast (mild)   Atypical mole 01/20/2022   Mid Back (mild)   Atypical nevi 02/24/2015   RIGHT NECK MILD   Atypical nevi 08/25/2015   LEFT UPPER ARM MILD   Atypical nevi 08/25/2015   LEFT LATERAL FOREARM MILD/FREE   Atypical nevi 02/23/2016   LEFT THIGH MODERATE/FREE   Atypical nevi 02/23/2016   LEFT MEDIAL LEG MODERATE/FREE   Atypical nevi 02/23/2016   LEFT UPPER BACK MILD /FREE   Atypical nevi 08/23/2016   RIGHT ANT PROX THIGH MILD /FREE    Atypical nevi 07/25/2018   MID LOWER BACK MODERATE   Atypical nevi 01/22/2019   LEFT MID BACK MILD /FREE   Atypical nevi 01/22/2019   LEFT NECK MILD   Atypical nevi 08/06/2019   LEFT INNER BREAST MODERATE W/S   Atypical nevi 08/06/2019   LEFT OUTER SIDE MILD/FREE   Blood transfusion without reported diagnosis    hx of   Breast cancer (HCC) 2022   CAD (coronary artery disease), native coronary artery 02/07/2019   Minimal CAD at the time of cath in 2016 in [facility] [place]   Family history of breast cancer 08/19/2021   Family history of pancreatic cancer 08/19/2021   Fatty liver 05/21/2020   Gastric polyps 10/02/2018   EGD 03/2015; benign   GERD (gastroesophageal reflux disease) 10/02/2018   on meds   History of melanoma 10/02/2018   Abdomen 2012; 2013; s/p local excisions.    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 DISTAL LOWER LEG TREATED BY DR. Leda Quail   Sleep apnea    Sleep apnea 05/26/2020   uses CPAP   Past Surgical History:  Procedure Laterality Date   ABDOMINAL HYSTERECTOMY  1990   APPENDECTOMY  1962   BLADDER SUSPENSION  1990   BREAST LUMPECTOMY WITH RADIOACTIVE SEED LOCALIZATION Left 09/09/2021   Procedure: LEFT BREAST LUMPECTOMY WITH RADIOACTIVE SEED LOCALIZATION X2;  Surgeon: Manus Rudd, MD;  Location: [facility];  Service: General;  Laterality: Left;   COLONOSCOPY  2016   in [place]-hx of polyps   EYE SURGERY  2013 torn retina laser   MENISCUS REPAIR Right 2001   right knee   thumb surgery Left    TONSILLECTOMY AND ADENOIDECTOMY     tuabl ligation      TUBAL LIGATION  1975   WISDOM TOOTH EXTRACTION     Family History  Problem Relation Age of Onset   Arthritis Mother    High blood pressure Mother    Hyperlipidemia Mother    Hypertension Mother    Stroke Mother    Varicose Veins Mother    Heart attack Father    Heart disease Father    High blood pressure Father    Hypertension Father    Arthritis Sister    Diabetes Sister    High Cholesterol Sister    High blood pressure Sister    Breast cancer Sister 82   Varicose Veins Sister    Pancreatic cancer Maternal Aunt  dx early 62s   Heart disease Maternal Uncle    Heart disease Maternal Uncle    Stroke Paternal Aunt    Early death Maternal Grandmother        Stroke at 67   High blood pressure Maternal Grandmother    Hypertension Maternal Grandmother    Stroke Maternal Grandmother    Asthma Maternal Grandfather    Kidney disease Maternal Grandfather    Arthritis Paternal Grandmother    Heart disease Paternal Grandmother    High blood pressure Paternal Grandmother    Hypertension Paternal Grandmother    Kidney disease Paternal Grandfather    Asthma Paternal Grandfather    COPD Paternal Grandfather    Prostate cancer Cousin        paternal cousins x2; dx after 40   Colon polyps Neg Hx    Colon cancer Neg Hx    Esophageal cancer Neg Hx    Rectal cancer Neg Hx    Stomach cancer Neg Hx    Social History   Socioeconomic History   Marital status: Widowed    Spouse name: Not on file   Number of children: 1   Years of education: Not on file   Highest education level: Not on file  Occupational History   Occupation: Retired [employer]  Tobacco Use   Smoking status: Former    Packs/day: 0.25    Years: 10.00    Additional pack years: 0.00    Total pack years: 2.50    Types: Cigarettes    Quit date: 09/12/1996    Years since quitting: 26.5   Smokeless tobacco: Never  Vaping Use   Vaping Use: Never used  Substance and Sexual Activity   Alcohol use: Yes     Alcohol/week: 10.0 standard drinks of alcohol    Types: 5 Glasses of wine, 5 Shots of liquor per week    Comment: occassional    Drug use: Never   Sexual activity: Not Currently    Birth control/protection: Abstinence, Post-menopausal    Comment: hysterectomy  Other Topics Concern   Not on file  Social History Narrative   Widowed 2018 after 40 years of marriage   One daughter, 2 grandchildren. Lives in [place]   Sister lives in same complex in [place]    Previously lived in [place], [place]   Social Determinants of Health   Financial Resource Strain: Low Risk  (03/17/2023)   Overall Financial Resource Strain (CARDIA)    Difficulty of Paying Living Expenses: Not hard at all  Food Insecurity: No Food Insecurity (03/17/2023)   Hunger Vital Sign    Worried About Running Out of Food in the Last Year: Never true    Ran Out of Food in the Last Year: Never true  Transportation Needs: No Transportation Needs (03/17/2023)   PRAPARE - Administrator, Civil Service (Medical): No    Lack of Transportation (Non-Medical): No  Physical Activity: Insufficiently Active (03/17/2023)   Exercise Vital Sign    Days of Exercise per Week: 3 days    Minutes of Exercise per Session: 10 min  Stress: No Stress Concern Present (03/17/2023)   [employer] of Occupational Health - Occupational Stress Questionnaire    Feeling of Stress : Not at all  Social Connections: Moderately Integrated (03/17/2023)   Social Connection and Isolation Panel [NHANES]    Frequency of Communication with Friends and Family: More than three times a week    Frequency of Social Gatherings with Friends and Family:  More than three times a week    Attends Religious Services: 1 to 4 times per year    Active Member of Clubs or Organizations: Yes    Attends Banker Meetings: More than 4 times per year    Marital Status: Widowed    Tobacco Counseling Counseling given: Not Answered   Clinical  Intake:  Pre-visit preparation completed: Yes  Pain : No/denies pain     BMI - recorded: 29.49 Nutritional Status: BMI 25 -29 Overweight Nutritional Risks: None Diabetes: No  How often do you need to have someone help you when you read instructions, pamphlets, or other written materials from your doctor or pharmacy?: 1 - Never  Interpreter Needed?: No  Information entered by :: Lanier Ensign, LPN   Activities of Daily Living    03/17/2023   11:26 AM  In your present state of health, do you have any difficulty performing the following activities:  Hearing? 0  Vision? 0  Difficulty concentrating or making decisions? 0  Walking or climbing stairs? 1  Comment at times with bad knee  Dressing or bathing? 0  Doing errands, shopping? 0  Preparing Food and eating ? N  Using the Toilet? N  In the past six months, have you accidently leaked urine? N  Do you have problems with loss of bowel control? N  Managing your Medications? N  Managing your Finances? N  Housekeeping or managing your Housekeeping? N    Patient Care Team: Willow Ora, MD as PCP - General (Family Medicine) Quintella Reichert, MD as PCP - Cardiology (Cardiology) Quintella Reichert, MD as Consulting Physician (Cardiology) Sherrie George, MD as Consulting Physician (Ophthalmology) Glyn Ade, PA-C as Physician Assistant (Dermatology) Manus Rudd, MD as Consulting Physician (General Surgery) Malachy Mood, MD as Consulting Physician (Hematology) Lonie Peak, MD as Attending Physician (Radiation Oncology) Erroll Luna, [facility] (Inactive) (Pharmacist)  Indicate any recent Medical Services you may have received from other than [facility] providers in the past year (date may be approximate).     Assessment:   This is a routine wellness examination for Merrillyn.  Hearing/Vision screen Hearing Screening - Comments:: Pt denies any hearing issues  Vision Screening - Comments:: Pt follows up with Dr Ashley Royalty  and now [facility] ophthalmology   Dietary issues and exercise activities discussed:     Goals Addressed             This Visit's Progress    Patient Stated       None at this time        Depression Screen    03/21/2023   11:05 AM 07/12/2022    9:59 AM 06/06/2022   11:55 AM 12/28/2021   10:46 AM 08/24/2021   10:04 AM 09/21/2020   10:20 AM 07/28/2020    8:39 AM  PHQ 2/9 Scores  PHQ - 2 Score 0 0 0 0 0 0 0    Fall Risk    03/17/2023   11:26 AM 07/12/2022    9:58 AM 12/28/2021   10:50 AM 09/21/2020   10:23 AM 07/28/2020    8:38 AM  Fall Risk   Falls in the past year? 1 1 0 0 0  Number falls in past yr: 0 0 0 0 0  Injury with Fall? 1 0 0 0 0  Comment bruise  rear end      Risk for fall due to : Impaired vision History of fall(s) Impaired vision    Follow  up Falls prevention discussed Falls evaluation completed Falls prevention discussed Falls prevention discussed     MEDICARE RISK AT HOME:   TIMED UP AND GO:  Was the test performed?  No    Cognitive Function:        03/21/2023   11:07 AM 12/28/2021   11:04 AM 09/21/2020   10:26 AM  6CIT Screen  What Year? 0 points 0 points 0 points  What month? 0 points 0 points 0 points  What time? 0 points 0 points   Count back from 20 0 points 0 points 0 points  Months in reverse 0 points 0 points 0 points  Repeat phrase 0 points 0 points 0 points  Total Score 0 points 0 points     Immunizations Immunization History  Administered Date(s) Administered   Fluad Quad(high Dose 65+) 07/16/2019, 07/09/2021   Influenza, High Dose Seasonal PF 06/04/2020   PFIZER(Purple Top)SARS-COV-2 Vaccination 09/27/2019, 10/18/2019, 07/03/2020   Pneumococcal Conjugate-13 07/18/2013   Pneumococcal Polysaccharide-23 09/11/2009   Zoster, Live 08/10/2012    TDAP status: Due, Education has been provided regarding the importance of this vaccine. Advised may receive this vaccine at local pharmacy or Health Dept. Aware to provide a copy of the  vaccination record if obtained from local pharmacy or Health Dept. Verbalized acceptance and understanding.  Flu Vaccine status: Due, Education has been provided regarding the importance of this vaccine. Advised may receive this vaccine at local pharmacy or Health Dept. Aware to provide a copy of the vaccination record if obtained from local pharmacy or Health Dept. Verbalized acceptance and understanding.  Pneumococcal vaccine status: Up to date  Covid-19 vaccine status: Completed vaccines  Qualifies for Shingles Vaccine? Yes   Zostavax completed No   Shingrix Completed?: No.    Education has been provided regarding the importance of this vaccine. Patient has been advised to call insurance company to determine out of pocket expense if they have not yet received this vaccine. Advised may also receive vaccine at local pharmacy or Health Dept. Verbalized acceptance and understanding.  Screening Tests Health Maintenance  Topic Date Due   DTaP/Tdap/Td (1 - Tdap) Never done   Zoster Vaccines- Shingrix (1 of 2) Never done   COVID-19 Vaccine (4 - 2023-24 season) 05/13/2022   INFLUENZA VACCINE  04/13/2023   MAMMOGRAM  01/31/2024   Medicare Annual Wellness (AWV)  03/20/2024   DEXA SCAN  05/18/2024   Colonoscopy  08/14/2025   Pneumonia Vaccine 59+ Years old  Completed   Hepatitis C Screening  Completed   HPV VACCINES  Aged Out    Health Maintenance  Health Maintenance Due  Topic Date Due   DTaP/Tdap/Td (1 - Tdap) Never done   Zoster Vaccines- Shingrix (1 of 2) Never done   COVID-19 Vaccine (4 - 2023-24 season) 05/13/2022    Colorectal cancer screening: Type of screening: Colonoscopy. Completed 08/14/20. Repeat every 5 years  Mammogram status: Completed 01/31/23. Repeat every year  Bone Density status: Completed 05/18/22. Results reflect: Bone density results: OSTEOPENIA. Repeat every 2 years.   Additional Screening:  Hepatitis C Screening: Completed 05/13/20  Vision Screening:  Recommended annual ophthalmology exams for early detection of glaucoma and other disorders of the eye. Is the patient up to date with their annual eye exam?  Yes  Who is the provider or what is the name of the office in which the patient attends annual eye exams? [facility] opthalmology  If pt is not established with a provider, would they like to be  referred to a provider to establish care? No .   Dental Screening: Recommended annual dental exams for proper oral hygiene   Community Resource Referral / Chronic Care Management: CRR required this visit?  No   CCM required this visit?  No     Plan:     I have personally reviewed and noted the following in the patient's chart:   Medical and social history Use of alcohol, tobacco or illicit drugs  Current medications and supplements including opioid prescriptions. Patient is not currently taking opioid prescriptions. Functional ability and status Nutritional status Physical activity Advanced directives List of other physicians Hospitalizations, surgeries, and ER visits in previous 12 months Vitals Screenings to include cognitive, depression, and falls Referrals and appointments  In addition, I have reviewed and discussed with patient certain preventive protocols, quality metrics, and best practice recommendations. A written personalized care plan for preventive services as well as general preventive health recommendations were provided to patient.     Marzella Schlein, LPN   09/17/1094   After Visit Summary: (MyChart) Due to this being a telephonic visit, the after visit summary with patients personalized plan was offered to patient via MyChart   Nurse Notes: none

## 2023-03-22 NOTE — Telephone Encounter (Signed)
Received documentation that pt's surgery date has changed to 08/07/2023.

## 2023-03-29 ENCOUNTER — Other Ambulatory Visit: Payer: Self-pay

## 2023-04-11 ENCOUNTER — Ambulatory Visit (INDEPENDENT_AMBULATORY_CARE_PROVIDER_SITE_OTHER)
Admission: RE | Admit: 2023-04-11 | Discharge: 2023-04-11 | Disposition: A | Discharge status: Home / Self Care | Payer: Medicare Other | Source: Ambulatory Visit | Attending: Family Medicine | Admitting: Family Medicine

## 2023-04-11 ENCOUNTER — Encounter: Payer: Self-pay | Admitting: Family Medicine

## 2023-04-11 ENCOUNTER — Ambulatory Visit (INDEPENDENT_AMBULATORY_CARE_PROVIDER_SITE_OTHER): Payer: Medicare Other | Admitting: Family Medicine

## 2023-04-11 VITALS — BP 118/68 | HR 70 | Temp 97.6°F | Ht 60.0 in | Wt 149.0 lb

## 2023-04-11 DIAGNOSIS — M19011 Primary osteoarthritis, right shoulder: Secondary | ICD-10-CM | POA: Diagnosis not present

## 2023-04-11 DIAGNOSIS — M25552 Pain in left hip: Secondary | ICD-10-CM | POA: Diagnosis not present

## 2023-04-11 DIAGNOSIS — M25551 Pain in right hip: Secondary | ICD-10-CM

## 2023-04-11 DIAGNOSIS — M545 Low back pain, unspecified: Secondary | ICD-10-CM

## 2023-04-11 DIAGNOSIS — C50812 Malignant neoplasm of overlapping sites of left female breast: Secondary | ICD-10-CM | POA: Diagnosis not present

## 2023-04-11 DIAGNOSIS — M7062 Trochanteric bursitis, left hip: Secondary | ICD-10-CM | POA: Diagnosis not present

## 2023-04-11 DIAGNOSIS — M810 Age-related osteoporosis without current pathological fracture: Secondary | ICD-10-CM | POA: Diagnosis not present

## 2023-04-11 DIAGNOSIS — M47816 Spondylosis without myelopathy or radiculopathy, lumbar region: Secondary | ICD-10-CM | POA: Diagnosis not present

## 2023-04-11 DIAGNOSIS — M160 Bilateral primary osteoarthritis of hip: Secondary | ICD-10-CM | POA: Diagnosis not present

## 2023-04-11 DIAGNOSIS — Z170 Estrogen receptor positive status [ER+]: Secondary | ICD-10-CM | POA: Diagnosis not present

## 2023-04-11 DIAGNOSIS — M7061 Trochanteric bursitis, right hip: Secondary | ICD-10-CM | POA: Insufficient documentation

## 2023-04-11 DIAGNOSIS — M5136 Other intervertebral disc degeneration, lumbar region: Secondary | ICD-10-CM | POA: Diagnosis not present

## 2023-04-11 DIAGNOSIS — M4316 Spondylolisthesis, lumbar region: Secondary | ICD-10-CM | POA: Diagnosis not present

## 2023-04-11 MED ORDER — PREDNISONE 10 MG PO TABS: ORAL_TABLET | ORAL | 0 refills | Status: DC

## 2023-04-11 NOTE — Progress Notes (Signed)
Subjective  CC:  Chief Complaint  Patient presents with   Back Pain    HPI: Madison Clements is a 77 y.o. female who presents to the office today to address the problems listed above in the chief complaint. 77 year old female with multiple medical problems currently on Zometa infusions monthly for breast cancer, supraventricular tachycardia on Cardizem, and diffuse osteoarthritis scheduled for total knee replacement on the right presents due to pain.  She reports in the last 2 to 4 weeks she has had more pain than usual.  Has chronic right shoulder arthritis that has been active.  Also had some myalgias, bilateral hip pain and low back pain.  Symptoms started about 4 weeks ago in the low back but then got better until last week when she experienced moderate to severe low back pain that started in the low lumbar area and radiated into the buttocks.  She describes the pain as heavy and moderate.  She used Celebrex and Voltaren without significant relief.  She used heat and then cold.  Cold tended to help.  Fortunately she is much better today.  She denies any radiation into the legs.  No lower extremity weakness no bowel or bladder dysfunction.  She has not suffered from this type of low back pain.  She does have active hip bursitis that have been treated with steroid injections in the past which were helpful.  She denies abdominal pain.  No recent trauma.  No fevers or chills.  No urinary symptoms.  Assessment  1. Acute bilateral low back pain without sciatica   2. Bilateral hip pain   3. Trochanteric bursitis of both hips   4. Osteoporosis of forearm   5. Malignant neoplasm of overlapping sites of left breast in female, estrogen receptor positive (HCC)   6. Primary osteoarthritis of right shoulder      Plan  Acute back pain and hip pain: Likely osteoarthritis related but differential diagnosis also includes spinal stenosis, compression fracture, SI joint disease.  She has active hip  bursitis and shoulder arthritis.  I recommend trial of prednisone taper.  Will get x-rays of the hip and lumbar spine.  Recommend follow-up in 2 weeks.  Further evaluation recommended at that time if needed.  Can consider physical therapy. Osteoporosis at risk for compression fractures.  Also on Zometa now for breast cancer treatment/prevention of recurrence   Follow up: 2 weeks for recheck   Orders Placed This Encounter  Procedures   DG HIPS BILAT W OR W/O PELVIS 2V   DG Lumbar Spine Complete   Meds ordered this encounter  Medications   predniSONE (DELTASONE) 10 MG tablet    Sig: Take 4 tabs qd x 2 days, 3 qd x 2 days, 2 qd x 2d, 1qd x 3 days    Dispense:  21 tablet    Refill:  0      I reviewed the patients updated PMH, FH, and SocHx.    Patient Active Problem List   Diagnosis Date Noted   OSA (obstructive sleep apnea) 05/26/2020    Priority: High   Coronary artery disease involving native coronary artery of native heart without angina pectoris 02/07/2019    Priority: High   Essential hypertension 10/02/2018    Priority: High   Mixed hyperlipidemia 10/02/2018    Priority: High   Pulmonary hypertension, primary (HCC) 10/02/2018    Priority: High   History of melanoma 10/02/2018    Priority: High   Polyp of colon, adenomatous 10/02/2018  Priority: High   Hepatic steatosis 07/28/2020    Priority: Medium    GERD (gastroesophageal reflux disease) 10/02/2018    Priority: Medium    Osteoporosis of forearm 10/02/2018    Priority: Medium    Primary osteoarthritis involving multiple joints 10/02/2018    Priority: Medium    Gastric polyps 10/02/2018    Priority: Medium    Primary osteoarthritis of right shoulder 04/11/2023   Trochanteric bursitis of both hips 04/11/2023   Atrial tachycardia 02/09/2023   Osteopenia 09/19/2022   SVT (supraventricular tachycardia) 07/11/2022   Genetic testing 08/30/2021   Family history of breast cancer 08/19/2021   Family history of  pancreatic cancer 08/19/2021   Malignant neoplasm of overlapping sites of left breast in female, estrogen receptor positive (HCC) 08/16/2021   Current Meds  Medication Sig   celecoxib (CELEBREX) 100 MG capsule TAKE 1 CAPSULE EVERY DAY   Cholecalciferol (VITAMIN D3) 50 MCG (2000 UT) capsule Take 2,000 Units by mouth daily. Pt takes every other day   diclofenac Sodium (VOLTAREN) 1 % GEL    diltiazem (CARDIZEM CD) 240 MG 24 hr capsule Take 1 capsule (240 mg total) by mouth daily.   folic acid (FOLVITE) 400 MCG tablet Take 400 mcg by mouth daily.   furosemide (LASIX) 40 MG tablet Take 1 tablet (40 mg total) daily. Take an extra 1/2 tablet (20 mg total) as needed.   lisinopril (ZESTRIL) 10 MG tablet Take 1 tablet (10 mg total) by mouth daily.   omeprazole (PRILOSEC) 20 MG capsule TAKE 1 CAPSULE EVERY DAY   predniSONE (DELTASONE) 10 MG tablet Take 4 tabs qd x 2 days, 3 qd x 2 days, 2 qd x 2d, 1qd x 3 days   Probiotic Product (ADVANCED PROBIOTIC PO) Take 1 capsule by mouth daily.   rosuvastatin (CRESTOR) 40 MG tablet Take 1 tablet (40 mg total) by mouth at bedtime.   Specialty Vitamins Products (ONE-A-DAY BONE STRENGTH PO) Take 1 tablet by mouth daily.    triamcinolone cream (KENALOG) 0.1 % Apply 1 Application topically 2 (two) times daily. For 2 weeks, then as needed    Allergies: Patient is allergic to morphine and morphine and codeine. Family History: Patient family history includes Arthritis in her mother, paternal grandmother, and sister; Asthma in her maternal grandfather and paternal grandfather; Breast cancer (age of onset: 104) in her sister; COPD in her paternal grandfather; Diabetes in her sister; Early death in her maternal grandmother; Heart attack in her father; Heart disease in her father, maternal uncle, maternal uncle, and paternal grandmother; High Cholesterol in her sister; High blood pressure in her father, maternal grandmother, mother, paternal grandmother, and sister;  Hyperlipidemia in her mother; Hypertension in her father, maternal grandmother, mother, and paternal grandmother; Kidney disease in her maternal grandfather and paternal grandfather; Pancreatic cancer in her maternal aunt; Prostate cancer in her cousin; Stroke in her maternal grandmother, mother, and paternal aunt; Varicose Veins in her mother and sister. Social History:  Patient  reports that she quit smoking about 26 years ago. Her smoking use included cigarettes. She started smoking about 36 years ago. She has a 2.5 pack-year smoking history. She has never used smokeless tobacco. She reports current alcohol use of about 10.0 standard drinks of alcohol per week. She reports that she does not use drugs.  Review of Systems: Constitutional: Negative for fever malaise or anorexia Cardiovascular: negative for chest pain Respiratory: negative for SOB or persistent cough Gastrointestinal: negative for abdominal pain  Objective  Vitals: BP  118/68 (BP Location: Left Arm, Patient Position: Sitting, Cuff Size: Normal)   Pulse 70   Temp 97.6 F (36.4 C) (Oral)   Ht 5' (1.524 m)   Wt 149 lb (67.6 kg)   SpO2 97%   BMI 29.10 kg/m  General: no acute distress , A&Ox3, moves to and from the exam table easily/unassisted Low back: Nontender lumbar spine, bilateral SI joints tender, bilateral greater trochanteric bursa tender, full back range of motion.  Negative straight leg raise bilaterally. Right knee with crepitus Bilateral hips with good range of motion, mildly decreased external rotation left with some pain  Commons side effects, risks, benefits, and alternatives for medications and treatment plan prescribed today were discussed, and the patient expressed understanding of the given instructions. Patient is instructed to call or message via MyChart if he/she has any questions or concerns regarding our treatment plan. No barriers to understanding were identified. We discussed Red Flag symptoms and signs  in detail. Patient expressed understanding regarding what to do in case of urgent or emergency type symptoms.  Medication list was reconciled, printed and provided to the patient in AVS. Patient instructions and summary information was reviewed with the patient as documented in the AVS. This note was prepared with assistance of Dragon voice recognition software. Occasional wrong-word or sound-a-like substitutions may have occurred due to the inherent limitations of voice recognition software

## 2023-04-12 ENCOUNTER — Encounter (INDEPENDENT_AMBULATORY_CARE_PROVIDER_SITE_OTHER): Payer: Self-pay

## 2023-04-14 ENCOUNTER — Encounter: Payer: Self-pay | Admitting: Family Medicine

## 2023-04-14 MED ORDER — TRAMADOL HCL 50 MG PO TABS: 50.0000 mg | ORAL_TABLET | Freq: Four times a day (QID) | ORAL | 0 refills | Status: DC | PRN

## 2023-04-17 NOTE — Progress Notes (Signed)
See mychart note Dear Madison Clements, Your x-rays show that you do have osteoarthritic changes at many levels of your lower spine and also some of both hips.  This is likely contributing to your low back pain.  I do not see any acute fractures or compression fractures. Sincerely, Dr. Mardelle Matte

## 2023-04-22 ENCOUNTER — Other Ambulatory Visit: Payer: Self-pay | Admitting: Internal Medicine

## 2023-04-23 NOTE — Assessment & Plan Note (Signed)
-  Her previous DEXA on 11/15/19 showed osteopenia (T-score -1.5). -Dexa 05/2022 showed T score -3.7 at forearm, -2.1 at hip; bisphosphonate was discussed with her last time. -she started Zometa in January 2024, plan to continue every 6 months for a total of 2 years.

## 2023-04-23 NOTE — Assessment & Plan Note (Addendum)
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 Biopsy 08/11/21 confirmed invasive mammary carcinoma. -left lumpectomy on 09/09/21 by Dr. 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 No nodes were removed due to her age. -she received adjuvant RT, completed in 11/2021 -she started adjuvant tamoxifen in 12/2021, but could not tolerate. She subsequently tried anastrozole and still can not tolerate. She stopped in 04/2022 -Screening mammogram in May 2024 were negative. -continue breast cancer surveillance

## 2023-04-24 ENCOUNTER — Inpatient Hospital Stay: Payer: Medicare Other | Attending: Hematology

## 2023-04-24 ENCOUNTER — Inpatient Hospital Stay: Payer: Medicare Other

## 2023-04-24 ENCOUNTER — Inpatient Hospital Stay (HOSPITAL_BASED_OUTPATIENT_CLINIC_OR_DEPARTMENT_OTHER): Payer: Medicare Other | Admitting: Hematology

## 2023-04-24 ENCOUNTER — Other Ambulatory Visit: Payer: Self-pay

## 2023-04-24 VITALS — BP 129/52 | HR 64 | Temp 97.9°F | Resp 18 | Ht 60.0 in | Wt 151.9 lb

## 2023-04-24 DIAGNOSIS — M810 Age-related osteoporosis without current pathological fracture: Secondary | ICD-10-CM | POA: Insufficient documentation

## 2023-04-24 DIAGNOSIS — R0600 Dyspnea, unspecified: Secondary | ICD-10-CM | POA: Insufficient documentation

## 2023-04-24 DIAGNOSIS — E559 Vitamin D deficiency, unspecified: Secondary | ICD-10-CM

## 2023-04-24 DIAGNOSIS — E538 Deficiency of other specified B group vitamins: Secondary | ICD-10-CM | POA: Diagnosis not present

## 2023-04-24 DIAGNOSIS — M8580 Other specified disorders of bone density and structure, unspecified site: Secondary | ICD-10-CM

## 2023-04-24 DIAGNOSIS — D649 Anemia, unspecified: Secondary | ICD-10-CM | POA: Insufficient documentation

## 2023-04-24 DIAGNOSIS — Z90710 Acquired absence of both cervix and uterus: Secondary | ICD-10-CM | POA: Diagnosis not present

## 2023-04-24 DIAGNOSIS — I4719 Other supraventricular tachycardia: Secondary | ICD-10-CM | POA: Diagnosis not present

## 2023-04-24 DIAGNOSIS — D519 Vitamin B12 deficiency anemia, unspecified: Secondary | ICD-10-CM | POA: Diagnosis not present

## 2023-04-24 DIAGNOSIS — Z853 Personal history of malignant neoplasm of breast: Secondary | ICD-10-CM | POA: Insufficient documentation

## 2023-04-24 DIAGNOSIS — Z170 Estrogen receptor positive status [ER+]: Secondary | ICD-10-CM | POA: Diagnosis not present

## 2023-04-24 DIAGNOSIS — C50812 Malignant neoplasm of overlapping sites of left female breast: Secondary | ICD-10-CM | POA: Diagnosis not present

## 2023-04-24 LAB — CBC WITH DIFFERENTIAL (CANCER CENTER ONLY)
Abs Immature Granulocytes: 0.26 10*3/uL — ABNORMAL HIGH (ref 0.00–0.07)
Basophils Absolute: 0 10*3/uL (ref 0.0–0.1)
Basophils Relative: 0 %
Eosinophils Absolute: 0.1 10*3/uL (ref 0.0–0.5)
Eosinophils Relative: 2 %
HCT: 34.6 % — ABNORMAL LOW (ref 36.0–46.0)
Hemoglobin: 11.8 g/dL — ABNORMAL LOW (ref 12.0–15.0)
Immature Granulocytes: 5 %
Lymphocytes Relative: 29 %
Lymphs Abs: 1.6 10*3/uL (ref 0.7–4.0)
MCH: 30.6 pg (ref 26.0–34.0)
MCHC: 34.1 g/dL (ref 30.0–36.0)
MCV: 89.6 fL (ref 80.0–100.0)
Monocytes Absolute: 0.4 10*3/uL (ref 0.1–1.0)
Monocytes Relative: 7 %
Neutro Abs: 3.2 10*3/uL (ref 1.7–7.7)
Neutrophils Relative %: 57 %
Platelet Count: 223 10*3/uL (ref 150–400)
RBC: 3.86 MIL/uL — ABNORMAL LOW (ref 3.87–5.11)
RDW: 13.7 % (ref 11.5–15.5)
WBC Count: 5.6 10*3/uL (ref 4.0–10.5)
nRBC: 0.5 % — ABNORMAL HIGH (ref 0.0–0.2)

## 2023-04-24 LAB — CMP (CANCER CENTER ONLY)
ALT: 26 U/L (ref 0–44)
AST: 27 U/L (ref 15–41)
Albumin: 3.7 g/dL (ref 3.5–5.0)
Alkaline Phosphatase: 71 U/L (ref 38–126)
Anion gap: 8 (ref 5–15)
BUN: 24 mg/dL — ABNORMAL HIGH (ref 8–23)
CO2: 25 mmol/L (ref 22–32)
Calcium: 9.3 mg/dL (ref 8.9–10.3)
Chloride: 102 mmol/L (ref 98–111)
Creatinine: 1.18 mg/dL — ABNORMAL HIGH (ref 0.44–1.00)
GFR, Estimated: 47 mL/min — ABNORMAL LOW (ref >60)
Glucose, Bld: 137 mg/dL — ABNORMAL HIGH (ref 70–99)
Potassium: 3.8 mmol/L (ref 3.5–5.1)
Sodium: 135 mmol/L (ref 135–145)
Total Bilirubin: 0.5 mg/dL (ref 0.3–1.2)
Total Protein: 6.3 g/dL — ABNORMAL LOW (ref 6.5–8.1)

## 2023-04-24 LAB — VITAMIN B12: Vitamin B-12: 161 pg/mL — ABNORMAL LOW (ref 180–914)

## 2023-04-24 LAB — FERRITIN: Ferritin: 747 ng/mL — ABNORMAL HIGH (ref 11–307)

## 2023-04-24 LAB — VITAMIN D 25 HYDROXY (VIT D DEFICIENCY, FRACTURES): Vit D, 25-Hydroxy: 40.71 ng/mL (ref 30–100)

## 2023-04-24 MED ORDER — SODIUM CHLORIDE 0.9 % IV SOLN: Freq: Once | INTRAVENOUS | Status: AC

## 2023-04-24 MED ORDER — ZOLEDRONIC ACID 4 MG/100ML IV SOLN
4.0000 mg | Freq: Once | INTRAVENOUS | Status: AC
  Administered 2023-04-24: 4 mg via INTRAVENOUS
  Filled 2023-04-24: qty 100

## 2023-04-24 NOTE — Progress Notes (Addendum)
[facility]   [phone] [phone]   Clinic Follow up Note   Patient Care Team: Willow Ora, MD as PCP - General (Family Medicine) Quintella Reichert, MD as PCP - Cardiology (Cardiology) Quintella Reichert, MD as Consulting Physician (Cardiology) Sherrie George, MD as Consulting Physician (Ophthalmology) Glyn Ade, PA-C as Physician Assistant (Dermatology) Manus Rudd, MD as Consulting Physician (General Surgery) Malachy Mood, MD as Consulting Physician (Hematology) Lonie Peak, MD as Attending Physician (Radiation Oncology) Erroll Luna, [facility] (Inactive) (Pharmacist)  Date of Service:  04/24/2023  CHIEF COMPLAINT: f/u of  left breast cancer     CURRENT THERAPY: Observation     ASSESSMENT:  Madison Clements is a 77 y.o. female with   Malignant neoplasm of overlapping sites of left breast in female, estrogen receptor positive (HCC) Stage IA, pT2, cN0, ER+/PR+/HER2-, Grade 2, RS 21 -she was under short-term f/u for left breast asymmetry. Biopsy 08/11/21 confirmed invasive mammary carcinoma. -left lumpectomy on 09/09/21 by Dr. 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 No nodes were removed due to her age. -she received adjuvant RT, completed in 11/2021 -she started adjuvant tamoxifen in 12/2021, but could not tolerate. She subsequently tried anastrozole and still can not tolerate. She stopped in 04/2022 -Screening mammogram in May 2024 were negative. -continue breast cancer surveillance    Osteoporosis of forearm -Her previous DEXA on 11/15/19 showed osteopenia (T-score -1.5). -Dexa 05/2022 showed T score -3.7 at forearm, -2.1 at hip; bisphosphonate was discussed with her last time. -she started Zometa in January 2024, plan to continue every 6 months for a total of 2 years.   Anemia -New, hg 11.8 today, previously was 15.1 7 months ago. -She denies any new symptoms or signs of bleeding. -Will check her ferritin,  B12 today -Will check stool OBX3   PLAN: - reviewed labs - given a stool occult card to do at home and return - I recommend pt to take prenatal vitamin - ordered iron labs - reviewed mammogram results  - proceed to treatment zometa today  - f/u in 6 months with labs and Zometa -repeat lab in 3 months for anemia f/u   Addendum -Her B12 level came back low at 161pg/ml, will start her on B12 injections weeklyX4 then monthly, lab and f/u in 3 months    SUMMARY OF ONCOLOGIC HISTORY: Oncology History Overview Note   Cancer Staging  Malignant neoplasm of overlapping sites of left breast in female, estrogen receptor positive (HCC) Staging form: Breast, AJCC 8th Edition - Clinical stage from 08/11/2021: Stage IA (cT1c, cN0, cM0, G2, ER+, PR+, HER2-) - Signed by Malachy Mood, MD on 08/18/2021     Malignant neoplasm of overlapping sites of left breast in female, estrogen receptor positive (HCC)  07/27/2021 Mammogram   EXAM: DIGITAL DIAGNOSTIC UNILATERAL LEFT MAMMOGRAM WITH TOMOSYNTHESIS AND CAD; ULTRASOUND LEFT BREAST LIMITED  IMPRESSION: 1. There is a suspicious mass in the left breast at 9 o'clock measuring 1.8 cm.   2. There is a 1.0 cm group of suspicious linear calcifications anterior to the suspicious mass in the left breast, as well as a faint 2 mm group which lie 1 cm lateral to the linear calcifications.   3.  No evidence of left axillary lymphadenopathy   08/11/2021 Cancer Staging   Staging form: Breast, AJCC 8th Edition - Clinical stage from 08/11/2021: Stage IA (cT1c, cN0, cM0, G2, ER+, PR+, HER2-) - Signed by Malachy Mood, MD on 08/18/2021  Stage prefix: Initial diagnosis Histologic grading system: 3 grade system   08/11/2021 Initial Biopsy   Diagnosis 1. Breast, left, needle core biopsy, 9 o'clock, ribbon clip - INVASIVE MAMMARY CARCINOMA - SEE COMMENT 2. Breast, left, needle core biopsy, lower inner quadrant, x clip - INVASIVE MAMMARY CARCINOMA - MAMMARY CARCINOMA  IN-SITU - CALCIFICATIONS - SEE COMMENT Microscopic Comment 1. The biopsy material shows an infiltrative proliferation of cells with arranged linearly and in small clusters. Based on the biopsy, the carcinoma appears Nottingham grade 2 of 3 and measures 1.2 cm in greatest linear extent. 2. Based on the biopsy, the carcinoma appears Nottingham grade 2 of 3 and measures 0.2 cm in greatest linear extent.  1. PROGNOSTIC INDICATORS Results: The tumor cells are NEGATIVE for Her2 (1+). Estrogen Receptor: 100%, POSITIVE, STRONG STAINING INTENSITY Progesterone Receptor: 60%, POSITIVE, STRONG STAINING INTENSITY Proliferation Marker Ki67: 10%  2. PROGNOSTIC INDICATORS Results: The tumor cells are NEGATIVE for Her2 (1+). Estrogen Receptor: 100%, POSITIVE, STRONG STAINING INTENSITY Progesterone Receptor: 60%, POSITIVE, STRONG STAINING INTENSITY Proliferation Marker Ki67: 15%   08/13/2021 Mammogram   EXAM: DIGITAL DIAGNOSTIC UNILATERAL RIGHT MAMMOGRAM WITH TOMOSYNTHESIS AND CAD  IMPRESSION: No mammographic evidence for malignancy.   08/16/2021 Initial Diagnosis   Malignant neoplasm of overlapping sites of left breast in female, estrogen receptor positive (HCC)   08/25/2021 Imaging   EXAM: BILATERAL BREAST MRI WITH AND WITHOUT CONTRAST  IMPRESSION: 1. Biopsy-proven invasive ductal carcinoma measuring approximately 2.8 x 1.7 x 1.5 cm in the inner breast at middle depth, associated with the ribbon shaped tissue marking clip placed at the time of core needle biopsy. Enhancement extends approximately 1.4 cm posterior to the clip. 2. Approximate 1.8 cm post biopsy hematoma with associated rim enhancement at the site of the biopsy-proven invasive ductal carcinoma and DCIS in the LOWER INNER QUADRANT at anterior depth associated with the X shaped tissue marking clip. 3. In combination, the overall enhancement spans approximately 4 cm. 4. No MRI evidence of malignancy involving the RIGHT breast. 5.  No pathologic lymphadenopathy.   08/27/2021 Genetic Testing   egative hereditary cancer genetic testing: no pathogenic variants detected in [facility] BRCAPlus Panel and CancerNext-Expanded +RNAinsight Panel.  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  The report dates are 08/27/2021 and 08/30/2021.   The BRCAplus panel offered by [employer] [facility] and includes sequencing and deletion/duplication analysis for the following 8 genes: ATM, BRCA1, BRCA2, CDH1, CHEK2, PALB2, PTEN, and TP53.  The CancerNext-Expanded gene panel offered by [facility] and includes sequencing, rearrangement, and RNA analysis for the following 77 genes: AIP, ALK, APC, ATM, AXIN2, BAP1, BARD1, BLM, BMPR1A, BRCA1, BRCA2, BRIP1, CDC73, CDH1, CDK4, CDKN1B, CDKN2A, CHEK2, CTNNA1, DICER1, FANCC, FH, FLCN, GALNT12, KIF1B, LZTR1, MAX, MEN1, MET, MLH1, MSH2, MSH3, MSH6, MUTYH, NBN, NF1, NF2, NTHL1, PALB2, PHOX2B, PMS2, POT1, PRKAR1A, PTCH1, PTEN, RAD51C, RAD51D, RB1, RECQL, RET, SDHA, SDHAF2, SDHB, SDHC, SDHD, SMAD4, SMARCA4, SMARCB1, SMARCE1, STK11, SUFU, TMEM127, TP53, TSC1, TSC2, VHL and XRCC2 (sequencing and deletion/duplication); EGFR, EGLN1, HOXB13, KIT, MITF, PDGFRA, POLD1, and POLE (sequencing only); EPCAM and GREM1 (deletion/duplication only).    09/09/2021 Definitive Surgery   FINAL MICROSCOPIC DIAGNOSIS:   A. BREAST, LEFT, LUMPECTOMY:  -  Invasive ductal carcinoma, Nottingham grade 2 of 3, 3.5 cm  -  Ductal carcinoma in-situ, intermediate grade  -  Margins uninvolved by carcinoma (<0.1 cm; anterior)  -  Previous biopsy site changes present    09/09/2021 Oncotype testing   Oncotype DX was obtained on  the final surgical sample and the recurrence score of 21 predicts a risk of recurrence outside the breast over the next 9 years of 7%, if the patient's only systemic therapy is an antiestrogen for 5 years.  It also predicts no benefit from chemotherapy.   10/20/2021 - 11/16/2021 Radiation Therapy    Site Technique Total Dose (Gy) Dose per Fx (Gy) Completed Fx Beam Energies  Breast, Left: Breast_L_axilla 3D 40.05/40.05 2.67 15/15 10X  Breast, Left: Breast_L_Bst 3D 10/10 2 5/5 6X, 10X     12/06/2021 - 04/2022 Anti-estrogen oral therapy   Tamoxifen x 5 years unable to tolerate, attempted anastrozole and had similar side effects.  Opted to forego adjuvant antiestrogen therapy      INTERVAL HISTORY:  Chadwick Yuen is here for a follow up of left breast cancer. She was last seen by me on 09/19/2022. She is seen in clinic today alone. She reports doing well overall.  No fatigue, pain or other new symptoms.    All other systems were reviewed with the patient and are negative.  MEDICAL HISTORY:  Past Medical History:  Diagnosis Date   Allergy 1990   Morphine following surgery   Arthritis    generalized   Atypical mole 02/24/2015   RGHT LATERAL THIGH MODERATE   Atypical mole 01/20/2022   Left Breast (mild)   Atypical mole 01/20/2022   Mid Back (mild)   Atypical nevi 02/24/2015   RIGHT NECK MILD   Atypical nevi 08/25/2015   LEFT UPPER ARM MILD   Atypical nevi 08/25/2015   LEFT LATERAL FOREARM MILD/FREE   Atypical nevi 02/23/2016   LEFT THIGH MODERATE/FREE   Atypical nevi 02/23/2016   LEFT MEDIAL LEG MODERATE/FREE   Atypical nevi 02/23/2016   LEFT UPPER BACK MILD /FREE   Atypical nevi 08/23/2016   RIGHT ANT PROX THIGH MILD /FREE   Atypical nevi 07/25/2018   MID LOWER BACK MODERATE   Atypical nevi 01/22/2019   LEFT MID BACK MILD /FREE   Atypical nevi 01/22/2019   LEFT NECK MILD   Atypical nevi 08/06/2019   LEFT INNER BREAST MODERATE W/S   Atypical nevi 08/06/2019   LEFT OUTER SIDE MILD/FREE   Blood transfusion without reported diagnosis    hx of   Breast cancer (HCC) 2022   CAD (coronary artery disease), native coronary artery 02/07/2019   Minimal CAD at the time of cath in 2016 in [facility] [place]   Family history of breast cancer 08/19/2021   Family  history of pancreatic cancer 08/19/2021   Fatty liver 05/21/2020   Gastric polyps 10/02/2018   EGD 03/2015; benign   GERD (gastroesophageal reflux disease) 10/02/2018   on meds   History of melanoma 10/02/2018   Abdomen 2012; 2013; s/p local excisions.    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 DISTAL LOWER LEG TREATED BY DR. Leda Quail   Sleep apnea    Sleep apnea 05/26/2020   uses CPAP    SURGICAL HISTORY: Past Surgical History:  Procedure Laterality Date   ABDOMINAL HYSTERECTOMY  1990  APPENDECTOMY  1962   BLADDER SUSPENSION  1990   BREAST LUMPECTOMY WITH RADIOACTIVE SEED LOCALIZATION Left 09/09/2021   Procedure: LEFT BREAST LUMPECTOMY WITH RADIOACTIVE SEED LOCALIZATION X2;  Surgeon: Manus Rudd, MD;  Location: [facility];  Service: General;  Laterality: Left;   COLONOSCOPY  2016   in [place]-hx of polyps   EYE SURGERY  2013 torn retina laser   MENISCUS REPAIR Right 2001   right knee   thumb surgery Left    TONSILLECTOMY AND ADENOIDECTOMY     tuabl ligation     TUBAL LIGATION  1975   WISDOM TOOTH EXTRACTION      I have reviewed the social history and family history with the patient and they are unchanged from previous note.  ALLERGIES:  is allergic to morphine and morphine and codeine.  MEDICATIONS:  Current Outpatient Medications  Medication Sig Dispense Refill   celecoxib (CELEBREX) 100 MG capsule TAKE 1 CAPSULE EVERY DAY 90 capsule 3   Cholecalciferol (VITAMIN D3) 50 MCG (2000 UT) capsule Take 2,000 Units by mouth daily. Pt takes every other day     diclofenac  Sodium (VOLTAREN) 1 % GEL      diltiazem (CARDIZEM CD) 240 MG 24 hr capsule Take 1 capsule (240 mg total) by mouth daily. 90 capsule 3   folic acid (FOLVITE) 400 MCG tablet Take 400 mcg by mouth daily.     furosemide (LASIX) 40 MG tablet Take 1 tablet (40 mg total) daily. Take an extra 1/2 tablet (20 mg total) as needed. 135 tablet 3   lisinopril (ZESTRIL) 10 MG tablet Take 1 tablet (10 mg total) by mouth daily. 90 tablet 3   omeprazole (PRILOSEC) 20 MG capsule TAKE 1 CAPSULE EVERY DAY 90 capsule 3   predniSONE (DELTASONE) 10 MG tablet Take 4 tabs qd x 2 days, 3 qd x 2 days, 2 qd x 2d, 1qd x 3 days 21 tablet 0   Probiotic Product (ADVANCED PROBIOTIC PO) Take 1 capsule by mouth daily.     rosuvastatin (CRESTOR) 40 MG tablet Take 1 tablet (40 mg total) by mouth at bedtime. 90 tablet 3   Specialty Vitamins Products (ONE-A-DAY BONE STRENGTH PO) Take 1 tablet by mouth daily.      traMADol (ULTRAM) 50 MG tablet Take 1 tablet (50 mg total) by mouth every 6 (six) hours as needed. 30 tablet 0   triamcinolone cream (KENALOG) 0.1 % Apply 1 Application topically 2 (two) times daily. For 2 weeks, then as needed 45 g 1   No current facility-administered medications for this visit.    PHYSICAL EXAMINATION: ECOG PERFORMANCE STATUS: 0 - Asymptomatic  Vitals:   04/24/23 1100  BP: (!) 129/52  Pulse: 64  Resp: 18  Temp: 97.9 F (36.6 C)  SpO2: 98%   Wt Readings from Last 3 Encounters:  04/24/23 151 lb 14.4 oz (68.9 kg)  04/11/23 149 lb (67.6 kg)  03/21/23 151 lb (68.5 kg)     GENERAL:alert, no distress and comfortable SKIN: skin color, texture, turgor are normal, no rashes or significant lesions EYES: normal, Conjunctiva are pink and non-injected, sclera clear NECK: supple, thyroid normal size, non-tender, without nodularity LYMPH:  no palpable lymphadenopathy in the cervical, axillary  LUNGS: clear to auscultation and percussion with normal breathing effort HEART: regular rate & rhythm and no  murmurs and no lower extremity edema ABDOMEN:abdomen soft, non-tender and normal bowel sounds Musculoskeletal:no cyanosis of digits and no clubbing  NEURO: alert & oriented x 3 with  fluent speech, no focal motor/sensory deficits   LABORATORY DATA:  I have reviewed the data as listed    Latest Ref Rng & Units 04/24/2023   10:04 AM 09/19/2022   11:20 AM 05/20/2022   11:21 AM  CBC  WBC 4.0 - 10.5 K/uL 5.6  5.5  5.5   Hemoglobin 12.0 - 15.0 g/dL 78.2  95.6  21.3   Hematocrit 36.0 - 46.0 % 34.6  42.2  41.0   Platelets 150 - 400 K/uL 223  254  258         Latest Ref Rng & Units 04/24/2023   10:04 AM 09/19/2022   11:20 AM 07/06/2022   10:26 AM  CMP  Glucose 70 - 99 mg/dL 086  97  578   BUN 8 - 23 mg/dL 24  17  16    Creatinine 0.44 - 1.00 mg/dL 4.69  6.29  5.28   Sodium 135 - 145 mmol/L 135  136  139   Potassium 3.5 - 5.1 mmol/L 3.8  3.7  3.8   Chloride 98 - 111 mmol/L 102  101  100   CO2 22 - 32 mmol/L 25  27  24    Calcium 8.9 - 10.3 mg/dL 9.3  41.3  24.4   Total Protein 6.5 - 8.1 g/dL 6.3  6.7    Total Bilirubin 0.3 - 1.2 mg/dL 0.5  1.1    Alkaline Phos 38 - 126 U/L 71  92    AST 15 - 41 U/L 27  24    ALT 0 - 44 U/L 26  21        RADIOGRAPHIC STUDIES: I have personally reviewed the radiological images as listed and agreed with the findings in the report. No results found.    No orders of the defined types were placed in this encounter.  All questions were answered. The patient knows to call the clinic with any problems, questions or concerns. No barriers to learning was detected. The total time spent in the appointment was 30 minutes.     Malachy Mood, MD 04/24/2023   I, Sharlette Dense, CMA, am acting as scribe for Malachy Mood, MD.   I have reviewed the above documentation for accuracy and completeness, and I agree with the above.

## 2023-04-24 NOTE — Patient Instructions (Signed)

## 2023-04-25 ENCOUNTER — Telehealth: Payer: Self-pay

## 2023-04-25 NOTE — Telephone Encounter (Signed)
Spoke with pt via telephone to inform pt that her B12 is low; therefore, Dr Mosetta Putt would like for the pt to get B12 injections wklyx4wks and then monthly.  Stated that Erie Noe from Dr. Latanya Maudlin scheduling team would be contacting the patient to get her scheduled for her B12 injections, lab, and f/u w/Dr. Mosetta Putt.  Pt verbalized understanding and had no further questions or concerns.

## 2023-04-26 ENCOUNTER — Telehealth: Payer: Self-pay | Admitting: Hematology

## 2023-04-27 ENCOUNTER — Other Ambulatory Visit: Payer: Self-pay

## 2023-04-27 ENCOUNTER — Inpatient Hospital Stay: Payer: Medicare Other

## 2023-04-27 DIAGNOSIS — D649 Anemia, unspecified: Secondary | ICD-10-CM | POA: Diagnosis not present

## 2023-04-27 DIAGNOSIS — Z90710 Acquired absence of both cervix and uterus: Secondary | ICD-10-CM | POA: Diagnosis not present

## 2023-04-27 DIAGNOSIS — R0600 Dyspnea, unspecified: Secondary | ICD-10-CM | POA: Diagnosis not present

## 2023-04-27 DIAGNOSIS — Z853 Personal history of malignant neoplasm of breast: Secondary | ICD-10-CM | POA: Diagnosis not present

## 2023-04-27 DIAGNOSIS — E538 Deficiency of other specified B group vitamins: Secondary | ICD-10-CM | POA: Diagnosis not present

## 2023-04-27 DIAGNOSIS — M8580 Other specified disorders of bone density and structure, unspecified site: Secondary | ICD-10-CM

## 2023-04-27 DIAGNOSIS — M810 Age-related osteoporosis without current pathological fracture: Secondary | ICD-10-CM | POA: Diagnosis not present

## 2023-04-27 MED ORDER — CYANOCOBALAMIN 1000 MCG/ML IJ SOLN
1000.0000 ug | Freq: Once | INTRAMUSCULAR | Status: AC
  Administered 2023-04-27: 1000 ug via INTRAMUSCULAR
  Filled 2023-04-27: qty 1

## 2023-04-27 NOTE — Patient Instructions (Signed)
 Vitamin B12 Injection What is this medication? Vitamin B12 (VAHY tuh min B12) prevents and treats low vitamin B12 levels in your body. It is used in people who do not get enough vitamin B12 from their diet or when their digestive tract does not absorb enough. Vitamin B12 plays an important role in maintaining the health of your nervous system and red blood cells. This medicine may be used for other purposes; ask your health care provider or pharmacist if you have questions. COMMON BRAND NAME(S): B-12 Compliance Kit, B-12 Injection Kit, Cyomin, Dodex, LA-12, Nutri-Twelve, Physicians EZ Use B-12, Primabalt, Vitamin Deficiency Injectable System - B12 What should I tell my care team before I take this medication? They need to know if you have any of these conditions: Kidney disease Leber's disease Megaloblastic anemia An unusual or allergic reaction to cyanocobalamin, cobalt, other medications, foods, dyes, or preservatives Pregnant or trying to get pregnant Breast-feeding How should I use this medication? This medication is injected into a muscle or deeply under the skin. It is usually given in a clinic or care team's office. However, your care team may teach you how to inject yourself. Follow all instructions. Talk to your care team about the use of this medication in children. Special care may be needed. Overdosage: If you think you have taken too much of this medicine contact a poison control center or emergency room at once. NOTE: This medicine is only for you. Do not share this medicine with others. What if I miss a dose? If you are given your dose at a clinic or care team's office, call to reschedule your appointment. If you give your own injections, and you miss a dose, take it as soon as you can. If it is almost time for your next dose, take only that dose. Do not take double or extra doses. What may interact with this medication? Alcohol Colchicine This list may not describe all possible  interactions. Give your health care provider a list of all the medicines, herbs, non-prescription drugs, or dietary supplements you use. Also tell them if you smoke, drink alcohol, or use illegal drugs. Some items may interact with your medicine. What should I watch for while using this medication? Visit your care team regularly. You may need blood work done while you are taking this medication. You may need to follow a special diet. Talk to your care team. Limit your alcohol intake and avoid smoking to get the best benefit. What side effects may I notice from receiving this medication? Side effects that you should report to your care team as soon as possible: Allergic reactions--skin rash, itching, hives, swelling of the face, lips, tongue, or throat Swelling of the ankles, hands, or feet Trouble breathing Side effects that usually do not require medical attention (report to your care team if they continue or are bothersome): Diarrhea This list may not describe all possible side effects. Call your doctor for medical advice about side effects. You may report side effects to FDA at 1-800-FDA-1088. Where should I keep my medication? Keep out of the reach of children. Store at room temperature between 15 and 30 degrees C (59 and 85 degrees F). Protect from light. Throw away any unused medication after the expiration date. NOTE: This sheet is a summary. It may not cover all possible information. If you have questions about this medicine, talk to your doctor, pharmacist, or health care provider.  2024 Elsevier/Gold Standard (2021-05-11 00:00:00)

## 2023-04-28 ENCOUNTER — Encounter: Payer: Self-pay | Admitting: Family Medicine

## 2023-04-28 ENCOUNTER — Ambulatory Visit (INDEPENDENT_AMBULATORY_CARE_PROVIDER_SITE_OTHER): Payer: Medicare Other | Admitting: Family Medicine

## 2023-04-28 VITALS — BP 112/50 | HR 65 | Temp 97.9°F | Ht 60.0 in | Wt 152.0 lb

## 2023-04-28 DIAGNOSIS — M7062 Trochanteric bursitis, left hip: Secondary | ICD-10-CM

## 2023-04-28 DIAGNOSIS — M47816 Spondylosis without myelopathy or radiculopathy, lumbar region: Secondary | ICD-10-CM

## 2023-04-28 DIAGNOSIS — M7061 Trochanteric bursitis, right hip: Secondary | ICD-10-CM | POA: Diagnosis not present

## 2023-04-28 DIAGNOSIS — M150 Primary generalized (osteo)arthritis: Secondary | ICD-10-CM

## 2023-04-28 DIAGNOSIS — M159 Polyosteoarthritis, unspecified: Secondary | ICD-10-CM

## 2023-04-28 NOTE — Progress Notes (Signed)
Subjective  CC:  Chief Complaint  Patient presents with   Back Pain   Hip Pain    HPI: Madison Clements is a 77 y.o. female who presents to the office today to address the problems listed above in the chief complaint. See last note.  Here for follow-up.  Lumbar spine x-rays revealed multilevel DJD with mild anterolisthesis and hip show bilateral mild osteoarthritic changes left greater than right.  She responded well to prednisone taper.  Back pain mostly resolved until about 2 days ago.  Now with some morning symptoms.  No new symptoms.  No radicular symptoms.  No bowel or bladder symptoms.  Never needed the tramadol.  Assessment  1. Primary osteoarthritis involving multiple joints   2. Trochanteric bursitis of both hips   3. Spondylosis of lumbar region without myelopathy or radiculopathy      Plan  Osteoarthritis, lumbar and hips: Much improved.  Responded well to prednisone taper.  Now recommend extra strength Tylenol arthritis Tylenol to twice daily.  Will refer for physical therapy for strengthening as well. Hip bursitis is improved but not completely resolved as she is mildly tender on exam, recommend ice and stretching.  Can consider steroid injections if worsens again.  Follow up: As scheduled for CPE 07/14/2023  Orders Placed This Encounter  Procedures   Ambulatory referral to Physical Therapy   No orders of the defined types were placed in this encounter.     I reviewed the patients updated PMH, FH, and SocHx.    Patient Active Problem List   Diagnosis Date Noted   Malignant neoplasm of overlapping sites of left breast in female, estrogen receptor positive (HCC) 08/16/2021    Priority: High   OSA (obstructive sleep apnea) 05/26/2020    Priority: High   Coronary artery disease involving native coronary artery of native heart without angina pectoris 02/07/2019    Priority: High   Essential hypertension 10/02/2018    Priority: High   Mixed hyperlipidemia  10/02/2018    Priority: High   Pulmonary hypertension, primary (HCC) 10/02/2018    Priority: High   History of melanoma 10/02/2018    Priority: High   Polyp of colon, adenomatous 10/02/2018    Priority: High   Primary osteoarthritis of right shoulder 04/11/2023    Priority: Medium    Hepatic steatosis 07/28/2020    Priority: Medium    GERD (gastroesophageal reflux disease) 10/02/2018    Priority: Medium    Osteoporosis of forearm 10/02/2018    Priority: Medium    Primary osteoarthritis involving multiple joints 10/02/2018    Priority: Medium    Gastric polyps 10/02/2018    Priority: Medium    Trochanteric bursitis of both hips 04/11/2023   Atrial tachycardia 02/09/2023   Osteopenia 09/19/2022   SVT (supraventricular tachycardia) 07/11/2022   Genetic testing 08/30/2021   Family history of breast cancer 08/19/2021   Family history of pancreatic cancer 08/19/2021   Current Meds  Medication Sig   celecoxib (CELEBREX) 100 MG capsule TAKE 1 CAPSULE EVERY DAY   Cholecalciferol (VITAMIN D3) 50 MCG (2000 UT) capsule Take 2,000 Units by mouth daily. Pt takes every other day   diclofenac Sodium (VOLTAREN) 1 % GEL    diltiazem (CARDIZEM CD) 240 MG 24 hr capsule TAKE 1 CAPSULE EVERY DAY   folic acid (FOLVITE) 400 MCG tablet Take 400 mcg by mouth daily.   furosemide (LASIX) 40 MG tablet Take 1 tablet (40 mg total) daily. Take an extra 1/2 tablet (20  mg total) as needed.   lisinopril (ZESTRIL) 10 MG tablet Take 1 tablet (10 mg total) by mouth daily.   omeprazole (PRILOSEC) 20 MG capsule TAKE 1 CAPSULE EVERY DAY   Probiotic Product (ADVANCED PROBIOTIC PO) Take 1 capsule by mouth daily.   rosuvastatin (CRESTOR) 40 MG tablet Take 1 tablet (40 mg total) by mouth at bedtime.   Specialty Vitamins Products (ONE-A-DAY BONE STRENGTH PO) Take 1 tablet by mouth daily.    triamcinolone cream (KENALOG) 0.1 % Apply 1 Application topically 2 (two) times daily. For 2 weeks, then as needed     Allergies: Patient is allergic to morphine and morphine and codeine. Family History: Patient family history includes Arthritis in her mother, paternal grandmother, and sister; Asthma in her maternal grandfather and paternal grandfather; Breast cancer (age of onset: 25) in her sister; COPD in her paternal grandfather; Diabetes in her sister; Early death in her maternal grandmother; Heart attack in her father; Heart disease in her father, maternal uncle, maternal uncle, and paternal grandmother; High Cholesterol in her sister; High blood pressure in her father, maternal grandmother, mother, paternal grandmother, and sister; Hyperlipidemia in her mother; Hypertension in her father, maternal grandmother, mother, and paternal grandmother; Kidney disease in her maternal grandfather and paternal grandfather; Pancreatic cancer in her maternal aunt; Prostate cancer in her cousin; Stroke in her maternal grandmother, mother, and paternal aunt; Varicose Veins in her mother and sister. Social History:  Patient  reports that she quit smoking about 26 years ago. Her smoking use included cigarettes. She started smoking about 36 years ago. She has a 2.5 pack-year smoking history. She has never used smokeless tobacco. She reports current alcohol use of about 10.0 standard drinks of alcohol per week. She reports that she does not use drugs.  Review of Systems: Constitutional: Negative for fever malaise or anorexia Cardiovascular: negative for chest pain Respiratory: negative for SOB or persistent cough Gastrointestinal: negative for abdominal pain  Objective  Vitals: BP (!) 112/50   Pulse 65   Temp 97.9 F (36.6 C)   Ht 5' (1.524 m)   Wt 152 lb (68.9 kg)   SpO2 98%   BMI 29.69 kg/m  General: no acute distress , A&Ox3 Appears comfortable today, moving easily No spinal tenderness, bilateral hips are mildly tender, normal gait  Commons side effects, risks, benefits, and alternatives for medications and  treatment plan prescribed today were discussed, and the patient expressed understanding of the given instructions. Patient is instructed to call or message via MyChart if he/she has any questions or concerns regarding our treatment plan. No barriers to understanding were identified. We discussed Red Flag symptoms and signs in detail. Patient expressed understanding regarding what to do in case of urgent or emergency type symptoms.  Medication list was reconciled, printed and provided to the patient in AVS. Patient instructions and summary information was reviewed with the patient as documented in the AVS. This note was prepared with assistance of Dragon voice recognition software. Occasional wrong-word or sound-a-like substitutions may have occurred due to the inherent limitations of voice recognition software

## 2023-05-04 ENCOUNTER — Inpatient Hospital Stay: Payer: Medicare Other

## 2023-05-04 DIAGNOSIS — R0600 Dyspnea, unspecified: Secondary | ICD-10-CM | POA: Diagnosis not present

## 2023-05-04 DIAGNOSIS — E538 Deficiency of other specified B group vitamins: Secondary | ICD-10-CM | POA: Diagnosis not present

## 2023-05-04 DIAGNOSIS — M810 Age-related osteoporosis without current pathological fracture: Secondary | ICD-10-CM | POA: Diagnosis not present

## 2023-05-04 DIAGNOSIS — D649 Anemia, unspecified: Secondary | ICD-10-CM | POA: Diagnosis not present

## 2023-05-04 DIAGNOSIS — M8580 Other specified disorders of bone density and structure, unspecified site: Secondary | ICD-10-CM

## 2023-05-04 DIAGNOSIS — Z90710 Acquired absence of both cervix and uterus: Secondary | ICD-10-CM | POA: Diagnosis not present

## 2023-05-04 DIAGNOSIS — Z853 Personal history of malignant neoplasm of breast: Secondary | ICD-10-CM | POA: Diagnosis not present

## 2023-05-04 MED ORDER — CYANOCOBALAMIN 1000 MCG/ML IJ SOLN
1000.0000 ug | Freq: Once | INTRAMUSCULAR | Status: AC
  Administered 2023-05-04: 1000 ug via INTRAMUSCULAR
  Filled 2023-05-04: qty 1

## 2023-05-04 NOTE — Patient Instructions (Signed)
Vitamin B12 Deficiency Vitamin B12 deficiency means that your body does not have enough vitamin B12. The body needs this important vitamin: To make red blood cells. To make genes (DNA). To help the nerves work. If you do not have enough vitamin B12 in your body, you can have health problems, such as not having enough red blood cells in the blood (anemia). What are the causes? Not eating enough foods that contain vitamin B12. Not being able to take in (absorb) vitamin B12 from the food that you eat. Certain diseases. A condition in which the body does not make enough of a certain protein. This results in your body not taking in enough vitamin B12. Having a surgery in which part of the stomach or small intestine is taken out. Taking medicines that make it hard for the body to take in vitamin B12. These include: Heartburn medicines. Some medicines that are used to treat diabetes. What increases the risk? Being an older adult. Eating a vegetarian or vegan diet that does not include any foods that come from animals. Not eating enough foods that contain vitamin B12 while you are pregnant. Taking certain medicines. Having alcoholism. What are the signs or symptoms? In some cases, there are no symptoms. If the condition leads to too few blood cells or nerve damage, symptoms can occur, such as: Feeling weak or tired. Not being hungry. Losing feeling (numbness) or tingling in your hands and feet. Redness and burning of the tongue. Feeling sad (depressed). Confusion or memory problems. Trouble walking. If anemia is very bad, symptoms can include: Being short of breath. Being dizzy. Having a very fast heartbeat. How is this treated? Changing the way you eat and drink, such as: Eating more foods that contain vitamin B12. Drinking little or no alcohol. Getting vitamin B12 shots. Taking vitamin B12 supplements by mouth (orally). Your doctor will tell you the dose that is best for you. Follow  these instructions at home: Eating and drinking  Eat foods that come from animals and have a lot of vitamin B12 in them. These include: Meats and poultry. This includes beef, pork, chicken, turkey, and organ meats, such as liver. Seafood, such as clams, rainbow trout, salmon, tuna, and haddock. Eggs. Dairy foods such as milk, yogurt, and cheese. Eat breakfast cereals that have vitamin B12 added to them (are fortified). Check the label. The items listed above may not be a complete list of foods and beverages you can eat and drink. Contact a dietitian for more information. Alcohol use Do not drink alcohol if: Your doctor tells you not to drink. You are pregnant, may be pregnant, or are planning to become pregnant. If you drink alcohol: Limit how much you have to: 0-1 drink a day for women. 0-2 drinks a day for men. Know how much alcohol is in your drink. In the [place] one drink equals one 12 oz bottle of beer (355 mL), one 5 oz glass of wine (148 mL), or one 1 oz glass of hard liquor (44 mL). General instructions Get any vitamin B12 shots if told by your doctor. Take supplements only as told by your doctor. Follow the directions. Keep all follow-up visits. Contact a doctor if: Your symptoms come back. Your symptoms get worse or do not get better with treatment. Get help right away if: You have trouble breathing. You have a very fast heartbeat. You have chest pain. You get dizzy. You faint. These symptoms may be an emergency. Get help right away. Call 911.   Do not wait to see if the symptoms will go away. Do not drive yourself to the hospital. Summary Vitamin B12 deficiency means that your body is not getting enough of the vitamin. In some cases, there are no symptoms of this condition. Treatment may include making a change in the way you eat and drink, getting shots, or taking supplements. Eat foods that have vitamin B12 in them. This information is not intended to replace advice  given to you by your health care provider. Make sure you discuss any questions you have with your health care provider. Document Revised: 04/23/2021 Document Reviewed: 04/23/2021 Elsevier Patient Education  2024 [employer]  

## 2023-05-10 ENCOUNTER — Other Ambulatory Visit (HOSPITAL_COMMUNITY): Payer: Medicare Other

## 2023-05-11 ENCOUNTER — Encounter: Payer: Self-pay | Admitting: Cardiology

## 2023-05-11 ENCOUNTER — Inpatient Hospital Stay: Payer: Medicare Other

## 2023-05-11 ENCOUNTER — Ambulatory Visit (HOSPITAL_BASED_OUTPATIENT_CLINIC_OR_DEPARTMENT_OTHER): Payer: Medicare Other

## 2023-05-11 VITALS — BP 145/65 | HR 79 | Temp 97.8°F | Resp 18

## 2023-05-11 DIAGNOSIS — Z853 Personal history of malignant neoplasm of breast: Secondary | ICD-10-CM | POA: Diagnosis not present

## 2023-05-11 DIAGNOSIS — M810 Age-related osteoporosis without current pathological fracture: Secondary | ICD-10-CM | POA: Diagnosis not present

## 2023-05-11 DIAGNOSIS — M8580 Other specified disorders of bone density and structure, unspecified site: Secondary | ICD-10-CM

## 2023-05-11 DIAGNOSIS — D649 Anemia, unspecified: Secondary | ICD-10-CM | POA: Diagnosis not present

## 2023-05-11 DIAGNOSIS — R0600 Dyspnea, unspecified: Secondary | ICD-10-CM | POA: Diagnosis not present

## 2023-05-11 DIAGNOSIS — Z90710 Acquired absence of both cervix and uterus: Secondary | ICD-10-CM | POA: Diagnosis not present

## 2023-05-11 DIAGNOSIS — R0609 Other forms of dyspnea: Secondary | ICD-10-CM | POA: Insufficient documentation

## 2023-05-11 DIAGNOSIS — I071 Rheumatic tricuspid insufficiency: Secondary | ICD-10-CM | POA: Insufficient documentation

## 2023-05-11 DIAGNOSIS — E538 Deficiency of other specified B group vitamins: Secondary | ICD-10-CM | POA: Diagnosis not present

## 2023-05-11 LAB — ECHOCARDIOGRAM COMPLETE
Area-P 1/2: 3.66 cm2
S' Lateral: 1.65 cm

## 2023-05-11 MED ORDER — CYANOCOBALAMIN 1000 MCG/ML IJ SOLN
1000.0000 ug | Freq: Once | INTRAMUSCULAR | Status: AC
  Administered 2023-05-11: 1000 ug via INTRAMUSCULAR
  Filled 2023-05-11: qty 1

## 2023-05-11 NOTE — Patient Instructions (Signed)
Vitamin B12 Deficiency Vitamin B12 deficiency means that your body does not have enough vitamin B12. The body needs this important vitamin: To make red blood cells. To make genes (DNA). To help the nerves work. If you do not have enough vitamin B12 in your body, you can have health problems, such as not having enough red blood cells in the blood (anemia). What are the causes? Not eating enough foods that contain vitamin B12. Not being able to take in (absorb) vitamin B12 from the food that you eat. Certain diseases. A condition in which the body does not make enough of a certain protein. This results in your body not taking in enough vitamin B12. Having a surgery in which part of the stomach or small intestine is taken out. Taking medicines that make it hard for the body to take in vitamin B12. These include: Heartburn medicines. Some medicines that are used to treat diabetes. What increases the risk? Being an older adult. Eating a vegetarian or vegan diet that does not include any foods that come from animals. Not eating enough foods that contain vitamin B12 while you are pregnant. Taking certain medicines. Having alcoholism. What are the signs or symptoms? In some cases, there are no symptoms. If the condition leads to too few blood cells or nerve damage, symptoms can occur, such as: Feeling weak or tired. Not being hungry. Losing feeling (numbness) or tingling in your hands and feet. Redness and burning of the tongue. Feeling sad (depressed). Confusion or memory problems. Trouble walking. If anemia is very bad, symptoms can include: Being short of breath. Being dizzy. Having a very fast heartbeat. How is this treated? Changing the way you eat and drink, such as: Eating more foods that contain vitamin B12. Drinking little or no alcohol. Getting vitamin B12 shots. Taking vitamin B12 supplements by mouth (orally). Your doctor will tell you the dose that is best for you. Follow  these instructions at home: Eating and drinking  Eat foods that come from animals and have a lot of vitamin B12 in them. These include: Meats and poultry. This includes beef, pork, chicken, turkey, and organ meats, such as liver. Seafood, such as clams, rainbow trout, salmon, tuna, and haddock. Eggs. Dairy foods such as milk, yogurt, and cheese. Eat breakfast cereals that have vitamin B12 added to them (are fortified). Check the label. The items listed above may not be a complete list of foods and beverages you can eat and drink. Contact a dietitian for more information. Alcohol use Do not drink alcohol if: Your doctor tells you not to drink. You are pregnant, may be pregnant, or are planning to become pregnant. If you drink alcohol: Limit how much you have to: 0-1 drink a day for women. 0-2 drinks a day for men. Know how much alcohol is in your drink. In the [place] one drink equals one 12 oz bottle of beer (355 mL), one 5 oz glass of wine (148 mL), or one 1 oz glass of hard liquor (44 mL). General instructions Get any vitamin B12 shots if told by your doctor. Take supplements only as told by your doctor. Follow the directions. Keep all follow-up visits. Contact a doctor if: Your symptoms come back. Your symptoms get worse or do not get better with treatment. Get help right away if: You have trouble breathing. You have a very fast heartbeat. You have chest pain. You get dizzy. You faint. These symptoms may be an emergency. Get help right away. Call 911.   Do not wait to see if the symptoms will go away. Do not drive yourself to the hospital. Summary Vitamin B12 deficiency means that your body is not getting enough of the vitamin. In some cases, there are no symptoms of this condition. Treatment may include making a change in the way you eat and drink, getting shots, or taking supplements. Eat foods that have vitamin B12 in them. This information is not intended to replace advice  given to you by your health care provider. Make sure you discuss any questions you have with your health care provider. Document Revised: 04/23/2021 Document Reviewed: 04/23/2021 Elsevier Patient Education  2024 [employer]  

## 2023-05-12 ENCOUNTER — Ambulatory Visit: Payer: Medicare Other | Admitting: Physical Therapy

## 2023-05-12 ENCOUNTER — Other Ambulatory Visit: Payer: Self-pay | Admitting: *Deleted

## 2023-05-12 ENCOUNTER — Encounter: Payer: Self-pay | Admitting: Physical Therapy

## 2023-05-12 DIAGNOSIS — I071 Rheumatic tricuspid insufficiency: Secondary | ICD-10-CM

## 2023-05-12 DIAGNOSIS — M25561 Pain in right knee: Secondary | ICD-10-CM | POA: Diagnosis not present

## 2023-05-12 DIAGNOSIS — M25552 Pain in left hip: Secondary | ICD-10-CM | POA: Diagnosis not present

## 2023-05-12 DIAGNOSIS — G8929 Other chronic pain: Secondary | ICD-10-CM | POA: Diagnosis not present

## 2023-05-12 DIAGNOSIS — M25551 Pain in right hip: Secondary | ICD-10-CM

## 2023-05-12 DIAGNOSIS — M5459 Other low back pain: Secondary | ICD-10-CM | POA: Diagnosis not present

## 2023-05-12 NOTE — Therapy (Unsigned)
OUTPATIENT PHYSICAL THERAPY LOWER EXTREMITY EVALUATION   Patient Name: Madison Clements MRN: [id number] DOB:Jul 23, 1946, 77 y.o., female Today's Date: 05/12/2023  END OF SESSION:  PT End of Session - 05/12/23 1254     Visit Number 1    Number of Visits 16    Date for PT Re-Evaluation 07/07/23    Authorization Type Medicare    PT Start Time 1104    PT Stop Time 1142    PT Time Calculation (min) 38 min    Activity Tolerance Patient tolerated treatment well    Behavior During Therapy WFL for tasks assessed/performed             Past Medical History:  Diagnosis Date   Allergy 1990   Morphine following surgery   Arthritis    generalized   Atypical mole 02/24/2015   RGHT LATERAL THIGH MODERATE   Atypical mole 01/20/2022   Left Breast (mild)   Atypical mole 01/20/2022   Mid Back (mild)   Atypical nevi 02/24/2015   RIGHT NECK MILD   Atypical nevi 08/25/2015   LEFT UPPER ARM MILD   Atypical nevi 08/25/2015   LEFT LATERAL FOREARM MILD/FREE   Atypical nevi 02/23/2016   LEFT THIGH MODERATE/FREE   Atypical nevi 02/23/2016   LEFT MEDIAL LEG MODERATE/FREE   Atypical nevi 02/23/2016   LEFT UPPER BACK MILD /FREE   Atypical nevi 08/23/2016   RIGHT ANT PROX THIGH MILD /FREE   Atypical nevi 07/25/2018   MID LOWER BACK MODERATE   Atypical nevi 01/22/2019   LEFT MID BACK MILD /FREE   Atypical nevi 01/22/2019   LEFT NECK MILD   Atypical nevi 08/06/2019   LEFT INNER BREAST MODERATE W/S   Atypical nevi 08/06/2019   LEFT OUTER SIDE MILD/FREE   Blood transfusion without reported diagnosis    hx of   Breast cancer (HCC) 2022   CAD (coronary artery disease), native coronary artery 02/07/2019   Minimal CAD at the time of cath in 2016 in [facility] [place]   Family history of breast cancer 08/19/2021   Family history of pancreatic cancer 08/19/2021   Fatty liver 05/21/2020   Gastric polyps 10/02/2018   EGD 03/2015; benign   GERD (gastroesophageal reflux disease)  10/02/2018   on meds   History of melanoma 10/02/2018   Abdomen 2012; 2013; s/p local excisions.    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 DISTAL LOWER LEG TREATED BY DR. Leda Quail   Sleep apnea    Sleep apnea 05/26/2020   uses CPAP   Tricuspid regurgitation    moderate by echo 04/2023   Past Surgical History:  Procedure Laterality Date   ABDOMINAL HYSTERECTOMY  1990   APPENDECTOMY  1962   BLADDER SUSPENSION  1990   BREAST LUMPECTOMY WITH RADIOACTIVE SEED LOCALIZATION Left 09/09/2021   Procedure: LEFT BREAST LUMPECTOMY WITH RADIOACTIVE SEED LOCALIZATION X2;  Surgeon: Manus Rudd, MD;  Location: [facility];  Service: General;  Laterality: Left;   COLONOSCOPY  2016   in [place]-hx of polyps   EYE SURGERY  2013 torn retina laser   MENISCUS REPAIR Right 2001   right knee   thumb surgery Left    TONSILLECTOMY AND ADENOIDECTOMY     tuabl ligation     TUBAL LIGATION  1975   WISDOM TOOTH EXTRACTION     Patient Active Problem List   Diagnosis Date Noted   Tricuspid regurgitation    Primary osteoarthritis of right shoulder 04/11/2023   Trochanteric bursitis of both hips 04/11/2023   Atrial tachycardia 02/09/2023   Osteopenia 09/19/2022   SVT (supraventricular tachycardia) 07/11/2022   Genetic testing 08/30/2021   Family history of breast cancer 08/19/2021   Family history of pancreatic cancer 08/19/2021   Malignant neoplasm of overlapping sites of left breast in female, estrogen receptor positive (HCC) 08/16/2021   Hepatic steatosis 07/28/2020   OSA (obstructive sleep apnea)  05/26/2020   Coronary artery disease involving native coronary artery of native heart without angina pectoris 02/07/2019   GERD (gastroesophageal reflux disease) 10/02/2018   Essential hypertension 10/02/2018   Mixed hyperlipidemia 10/02/2018   Osteoporosis of forearm 10/02/2018   Primary osteoarthritis involving multiple joints 10/02/2018   Pulmonary hypertension, primary (HCC) 10/02/2018   History of melanoma 10/02/2018   Polyp of colon, adenomatous 10/02/2018   Gastric polyps 10/02/2018    PCP: Asencion Partridge   REFERRING PROVIDER: Asencion Partridge   REFERRING DIAG: bil hip pain, low back pain, R knee pain   THERAPY DIAG:  Other low back pain  Bilateral hip pain  Chronic pain of right knee  Rationale for Evaluation and Treatment: Rehabilitation  ONSET DATE:    SUBJECTIVE:   SUBJECTIVE STATEMENT: Pt states several areas of pain. She has pain and OA in R knee, and is scheduled for R TKA in  November. L knee non painful at this time. She also has pain in Low back: ongoing for about 6 months. Prednisone helped, also taking tylenol arthritis, up to 4/day and topical creams. States increased pain with Increased activity, walking and standing. Pain location is  Very low, center of back. Sitting more comfortable. Not currently doing any exercise for back. Hips are both painful L and R. Flare up from time to time. Has had injections in the past. But no PT.    PERTINENT HISTORY: Breast CA 2022, new heart valve issue. , CAD,  PAIN:  Are you having pain? Yes: NPRS scale: 6/10 Pain location: back  Pain description: sore , tight  Aggravating factors: standing, walking  Relieving factors: sitting, rest   Are you having pain? Yes: NPRS scale: 5/10 Pain location: Bil hips  Pain description: sore  Aggravating factors: standing, walking, activity  Relieving factors: rest   Are you having pain? Yes: NPRS scale: 0-5 /10 Pain location: R knee  Pain description: sore  Aggravating factors:  increased activity  Relieving factors: none stated   PRECAUTIONS: None  WEIGHT BEARING RESTRICTIONS: No  FALLS:  Has patient fallen in last 6 months? No 1 fall 1 year ago, at home.  No injury   PLOF: Independent  PATIENT GOALS:  Decreased pain   NEXT MD VISIT:   OBJECTIVE:   DIAGNOSTIC FINDINGS:   PATIENT SURVEYS:  Fotot- low back-   COGNITION: Overall cognitive status: Within functional limits for tasks assessed     SENSATION: WFL  EDEMA:   POSTURE:    No Significant postural limitations  PALPATION:  Tender to palpate bil hip- greater trochanters Tenderness in L4/5 region centrally, and into bil SI, top of sacrum, and into bil glutes  LOWER EXTREMITY ROM:  Hips: WFL Back: [facility]  Active /Passive ROM Left eval Right eval  Hip flexion    Hip extension    Hip abduction    Hip adduction    Hip internal rotation    Hip external rotation    Knee flexion wfl 110  Knee extension  -15  Ankle dorsiflexion    Ankle plantarflexion    Ankle inversion    Ankle eversion     (Blank rows = not tested)  LOWER EXTREMITY MMT:  MMT Left eval Right  eval  Hip flexion 4  4  Hip extension    Hip abduction 4 4  Hip adduction    Hip internal rotation    Hip external rotation    Knee flexion 5 4  Knee extension 5 4  Ankle dorsiflexion    Ankle plantarflexion    Ankle inversion    Ankle eversion     (Blank rows = not tested)   LOWER EXTREMITY SPECIAL TESTS:    GAIT: Distance walked: 100 ft  Assistive device utilized: None Level of assistance: Complete Independence Comments: varus position of R knee, mild increase in lateral displacement   TODAY'S TREATMENT:                                                                                                                              DATE:  05/12/23 Ther ex: See below for HEP.    PATIENT EDUCATION:  Education details: PT POC, Exam findings, HEP Person educated: Patient Education method:  Explanation, Demonstration, Tactile cues, Verbal cues, and Handouts Education comprehension: verbalized understanding, returned demonstration, verbal cues required, tactile cues required, and needs further education   HOME EXERCISE PROGRAM: Access Code: [facility] URL: [email or web address] Date: 05/12/2023 Prepared by: Sedalia Muta  Exercises - Supine Posterior Pelvic Tilt  - 1-2 x daily - 1-2 sets - 10 reps - Supine Single Knee to Chest Stretch  - 2 x daily - 3 reps - 30 hold - Bent Knee Fallouts  - 1-2 x daily - 1 sets - 10 reps - 3 hold - Sidelying Hip Abduction  - 1 x daily - 3-4 x weekly - 1 sets - 5- 10 reps   ASSESSMENT:  CLINICAL IMPRESSION: Patient presents with primary complaint of  pain in low back. She has pain in low lumbar and sacral region. She also has  pain in bil hips, consistent with bursitis. R knee has significant OA and she is scheduled for replacement in November. She has ROM loss for flexion and extension in knee, as well as mild gait deficits. Hip pain likely influenced by knee dysfunction and gait deficits. Pt with pain in multiple areas, and has lack of effective HEP for management. Pt with decreased ability for full functional activities. Pt will  benefit from skilled PT to improve deficits and pain and to return to PLOF.   OBJECTIVE IMPAIRMENTS: Abnormal gait, decreased activity tolerance, decreased mobility, decreased ROM,  decreased strength, increased muscle spasms, improper body mechanics, and pain.   ACTIVITY LIMITATIONS: lifting, bending, sitting, standing, squatting, stairs, transfers, hygiene/grooming, and locomotion level  PARTICIPATION LIMITATIONS: meal prep, cleaning, laundry, shopping, and community activity  PERSONAL FACTORS: Time since onset of injury/illness/exacerbation and 1 comorbidity: multiple pain locations  are also affecting patient's functional outcome.   REHAB POTENTIAL: Good  CLINICAL DECISION MAKING:  Stable/uncomplicated  EVALUATION COMPLEXITY: Low   GOALS: Goals reviewed with patient? Yes   SHORT TERM GOALS: Target date: 05/26/2023   Pt to be independent with initial HEP  Goal status: INITIAL   LONG TERM GOALS: Target date: 07/07/2023  Pt to be independent with final HEP  Goal status: INITIAL  2.  Pt to report improved hip strength to 0-2/10 with activity   Goal status: INITIAL  3.  Pt to report decreased pain in low back to 0-3/10 with standing activity .   Goal status: INITIAL  4.  Pt to demo R knee being at max capacity for ROM and strength, to ensure best pre-op function.   Goal status: INITIAL     PLAN:  PT FREQUENCY: 1-2x/week  PT DURATION: 8 weeks  PLANNED INTERVENTIONS: Therapeutic exercises, Therapeutic activity, Neuromuscular re-education, Patient/Family education, Self Care, Joint mobilization, Joint manipulation, Stair training, Orthotic/Fit training, DME instructions, Aquatic Therapy, Dry Needling, Electrical stimulation, Cryotherapy, Moist heat, Taping, Ultrasound, Ionotophoresis 4mg /ml Dexamethasone, Manual therapy,  Vasopneumatic device, Traction, Spinal manipulation, Spinal mobilization,Balance training, Gait training,   PLAN FOR NEXT SESSION:    Sedalia Muta, PT, DPT 12:55 PM  05/12/23

## 2023-05-17 ENCOUNTER — Ambulatory Visit (INDEPENDENT_AMBULATORY_CARE_PROVIDER_SITE_OTHER): Payer: Medicare Other | Admitting: Physical Therapy

## 2023-05-17 ENCOUNTER — Encounter: Payer: Self-pay | Admitting: Physical Therapy

## 2023-05-17 DIAGNOSIS — M25561 Pain in right knee: Secondary | ICD-10-CM

## 2023-05-17 DIAGNOSIS — M5459 Other low back pain: Secondary | ICD-10-CM

## 2023-05-17 DIAGNOSIS — M25551 Pain in right hip: Secondary | ICD-10-CM

## 2023-05-17 DIAGNOSIS — G8929 Other chronic pain: Secondary | ICD-10-CM

## 2023-05-17 DIAGNOSIS — M25552 Pain in left hip: Secondary | ICD-10-CM | POA: Diagnosis not present

## 2023-05-17 NOTE — Therapy (Signed)
OUTPATIENT PHYSICAL THERAPY LOWER EXTREMITY TREATMENT    Patient Name: Madison Clements MRN: [id number] DOB:03-Jan-1946, 77 y.o., female Today's Date: 05/17/2023  END OF SESSION:  PT End of Session - 05/17/23 1208     Visit Number 2    Number of Visits 16    Date for PT Re-Evaluation 07/07/23    Authorization Type Medicare    PT Start Time 1213    PT Stop Time 1257    PT Time Calculation (min) 44 min    Activity Tolerance Patient tolerated treatment well    Behavior During Therapy WFL for tasks assessed/performed             Past Medical History:  Diagnosis Date   Allergy 1990   Morphine following surgery   Arthritis    generalized   Atypical mole 02/24/2015   RGHT LATERAL THIGH MODERATE   Atypical mole 01/20/2022   Left Breast (mild)   Atypical mole 01/20/2022   Mid Back (mild)   Atypical nevi 02/24/2015   RIGHT NECK MILD   Atypical nevi 08/25/2015   LEFT UPPER ARM MILD   Atypical nevi 08/25/2015   LEFT LATERAL FOREARM MILD/FREE   Atypical nevi 02/23/2016   LEFT THIGH MODERATE/FREE   Atypical nevi 02/23/2016   LEFT MEDIAL LEG MODERATE/FREE   Atypical nevi 02/23/2016   LEFT UPPER BACK MILD /FREE   Atypical nevi 08/23/2016   RIGHT ANT PROX THIGH MILD /FREE   Atypical nevi 07/25/2018   MID LOWER BACK MODERATE   Atypical nevi 01/22/2019   LEFT MID BACK MILD /FREE   Atypical nevi 01/22/2019   LEFT NECK MILD   Atypical nevi 08/06/2019   LEFT INNER BREAST MODERATE W/S   Atypical nevi 08/06/2019   LEFT OUTER SIDE MILD/FREE   Blood transfusion without reported diagnosis    hx of   Breast cancer (HCC) 2022   CAD (coronary artery disease), native coronary artery 02/07/2019   Minimal CAD at the time of cath in 2016 in [facility]   Family history of breast cancer 08/19/2021   Family history of pancreatic cancer 08/19/2021   Fatty liver 05/21/2020   Gastric polyps 10/02/2018   EGD 03/2015; benign   GERD (gastroesophageal reflux disease)  10/02/2018   on meds   History of melanoma 10/02/2018   Abdomen 2012; 2013; s/p local excisions.    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 DISTAL LOWER LEG TREATED BY DR. Leda Quail   Sleep apnea    Sleep apnea 05/26/2020   uses CPAP   Tricuspid regurgitation    moderate by echo 04/2023   Past Surgical History:  Procedure Laterality Date   ABDOMINAL HYSTERECTOMY  1990   APPENDECTOMY  1962   BLADDER SUSPENSION  1990   BREAST LUMPECTOMY WITH RADIOACTIVE SEED LOCALIZATION Left 09/09/2021   Procedure: LEFT BREAST LUMPECTOMY WITH RADIOACTIVE SEED LOCALIZATION X2;  Surgeon: Manus Rudd, MD;  Location: [facility];  Service: General;  Laterality: Left;   COLONOSCOPY  2016   in [place]-hx of polyps   EYE  SURGERY  2013 torn retina laser   MENISCUS REPAIR Right 2001   right knee   thumb surgery Left    TONSILLECTOMY AND ADENOIDECTOMY     tuabl ligation     TUBAL LIGATION  1975   WISDOM TOOTH EXTRACTION     Patient Active Problem List   Diagnosis Date Noted   Tricuspid regurgitation    Primary osteoarthritis of right shoulder 04/11/2023   Trochanteric bursitis of both hips 04/11/2023   Atrial tachycardia 02/09/2023   Osteopenia 09/19/2022   SVT (supraventricular tachycardia) 07/11/2022   Genetic testing 08/30/2021   Family history of breast cancer 08/19/2021   Family history of pancreatic cancer 08/19/2021   Malignant neoplasm of overlapping sites of left breast in female, estrogen receptor positive (HCC) 08/16/2021   Hepatic steatosis 07/28/2020   OSA (obstructive sleep apnea)  05/26/2020   Coronary artery disease involving native coronary artery of native heart without angina pectoris 02/07/2019   GERD (gastroesophageal reflux disease) 10/02/2018   Essential hypertension 10/02/2018   Mixed hyperlipidemia 10/02/2018   Osteoporosis of forearm 10/02/2018   Primary osteoarthritis involving multiple joints 10/02/2018   Pulmonary hypertension, primary (HCC) 10/02/2018   History of melanoma 10/02/2018   Polyp of colon, adenomatous 10/02/2018   Gastric polyps 10/02/2018    PCP: Asencion Partridge   REFERRING PROVIDER: Asencion Partridge   REFERRING DIAG: bil hip pain, low back pain, R knee pain   THERAPY DIAG:  Other low back pain  Bilateral hip pain  Chronic pain of right knee  Rationale for Evaluation and Treatment: Rehabilitation  ONSET DATE:    SUBJECTIVE:   SUBJECTIVE STATEMENT: Monday night, pt states increased pain, episode of significant pain like she had several weeks ago. She used tylenol and eventually pain calmed down. By Tuesday and today, she is feeling quite a bit better.   Eval: Pt states several areas of pain. She has pain and OA in R knee, and is scheduled for R TKA in  November. L knee non painful at this time. She also has pain in Low back: ongoing for about 6 months. Prednisone helped, also taking tylenol arthritis, up to 4/day and topical creams. States increased pain with Increased activity, walking and standing. Pain location is  Very low, center of back. Sitting more comfortable. Not currently doing any exercise for back. Hips are both painful L and R. Flare up from time to time. Has had injections in the past. But no PT.    PERTINENT HISTORY: Breast CA 2022, new heart valve issue. , CAD,  PAIN:  Are you having pain? Yes: NPRS scale: 6/10 Pain location: back  Pain description: sore , tight  Aggravating factors: standing, walking  Relieving factors: sitting, rest   Are you having pain? Yes: NPRS scale: 5/10 Pain location: Bil hips   Pain description: sore  Aggravating factors: standing, walking, activity  Relieving factors: rest   Are you having pain? Yes: NPRS scale: 0-5 /10 Pain location: R knee  Pain description: sore  Aggravating factors: increased activity  Relieving factors: none stated   PRECAUTIONS: None  WEIGHT BEARING RESTRICTIONS: No  FALLS:  Has patient fallen in last 6 months? No 1 fall 1 year ago, at home.  No injury   PLOF: Independent  PATIENT GOALS:  Decreased pain   NEXT MD VISIT:   OBJECTIVE:   DIAGNOSTIC FINDINGS:   PATIENT SURVEYS:  Fotot- low back-  initial:  51.6  COGNITION: Overall cognitive status: Within functional limits for tasks assessed  SENSATION: WFL  EDEMA:   POSTURE:    No Significant postural limitations  PALPATION:  Tender to palpate bil hip- greater trochanters Tenderness in L4/5 region centrally, and into bil SI, top of sacrum, and into bil glutes    LOWER EXTREMITY ROM:  Hips: WFL Back: [facility]  Active /Passive ROM Left eval Right eval  Hip flexion    Hip extension    Hip abduction    Hip adduction    Hip internal rotation    Hip external rotation    Knee flexion wfl 110  Knee extension  -15  Ankle dorsiflexion    Ankle plantarflexion    Ankle inversion    Ankle eversion     (Blank rows = not tested)  LOWER EXTREMITY MMT:  MMT Left eval Right  eval  Hip flexion 4  4  Hip extension    Hip abduction 4 4  Hip adduction    Hip internal rotation    Hip external rotation    Knee flexion 5 4  Knee extension 5 4  Ankle dorsiflexion    Ankle plantarflexion    Ankle inversion    Ankle eversion     (Blank rows = not tested)   LOWER EXTREMITY SPECIAL TESTS:    GAIT: Distance walked: 100 ft  Assistive device utilized: None Level of assistance: Complete Independence Comments: varus position of R knee, mild increase in lateral displacement   TODAY'S TREATMENT:                                                                                                                               DATE:   05/17/23.  Therapeutic Exercise: Aerobic: Supine:  pelvic tilts x 20;  SLR 2 x 5 bil;   TA with education on optimal contraction, with breathing 2 x 10;  S/L: hip abd x10 bil    clams   x 10 bil;  Seated:  education on optimal seated position, neutral pelvis, and hip hinge motion.  Sit to stand 2 x 5;  Standing: Stretches:   SKTC 30 sec x 3 bil;   Hip ER fallouts x 10;  Neuromuscular Re-education: Manual Therapy: Therapeutic Activity: Self Care:    PATIENT EDUCATION:  Education details: updated and reviewed HEP.  Person educated: Patient Education method: Explanation, Demonstration, Tactile cues, Verbal cues, and Handouts Education comprehension: verbalized understanding, returned demonstration, verbal cues required, tactile cues required, and needs further education   HOME EXERCISE PROGRAM: Access Code: [facility] URL: [email or web address] Date: 05/12/2023 Prepared by: Sedalia Muta  Exercises - Supine Posterior Pelvic Tilt  - 1-2 x daily - 1-2 sets - 10 reps - Supine Single Knee to Chest Stretch  - 2 x daily - 3 reps - 30 hold - Bent Knee Fallouts  - 1-2 x daily - 1 sets - 10 reps - 3 hold - Sidelying Hip Abduction  - 1 x daily - 3-4 x weekly - 1 sets - 5-  10 reps   ASSESSMENT:  CLINICAL IMPRESSION: 05/17/2023  Pt with no increased pain in back today. She is challenged with hip strength, and core strength. Improved ability for ta contraction after education and practice today. Plan to progress as able.   Eval: Patient presents with primary complaint of  pain in low back. She has pain in low lumbar and sacral region. She also has  pain in bil hips, consistent with bursitis. R knee has significant OA and she is scheduled for replacement in November. She has ROM loss for flexion and extension in knee, as well as mild gait deficits. Hip pain likely influenced by knee dysfunction and gait  deficits. Pt with pain in multiple areas, and has lack of effective HEP for management. Pt with decreased ability for full functional activities. Pt will  benefit from skilled PT to improve deficits and pain and to return to PLOF.   OBJECTIVE IMPAIRMENTS: Abnormal gait, decreased activity tolerance, decreased mobility, decreased ROM, decreased strength, increased muscle spasms, improper body mechanics, and pain.   ACTIVITY LIMITATIONS: lifting, bending, sitting, standing, squatting, stairs, transfers, hygiene/grooming, and locomotion level  PARTICIPATION LIMITATIONS: meal prep, cleaning, laundry, shopping, and community activity  PERSONAL FACTORS: Time since onset of injury/illness/exacerbation and 1 comorbidity: multiple pain locations  are also affecting patient's functional outcome.   REHAB POTENTIAL: Good  CLINICAL DECISION MAKING: Stable/uncomplicated  EVALUATION COMPLEXITY: Low   GOALS: Goals reviewed with patient? Yes   SHORT TERM GOALS: Target date: 05/26/2023   Pt to be independent with initial HEP  Goal status: INITIAL   LONG TERM GOALS: Target date: 07/07/2023  Pt to be independent with final HEP  Goal status: INITIAL  2.  Pt to report improved hip strength to 0-2/10 with activity   Goal status: INITIAL  3.  Pt to report decreased pain in low back to 0-3/10 with standing activity .   Goal status: INITIAL  4.  Pt to demo R knee being at max capacity for ROM and strength, to ensure best pre-op function.   Goal status: INITIAL     PLAN:  PT FREQUENCY: 1-2x/week  PT DURATION: 8 weeks  PLANNED INTERVENTIONS: Therapeutic exercises, Therapeutic activity, Neuromuscular re-education, Patient/Family education, Self Care, Joint mobilization, Joint manipulation, Stair training, Orthotic/Fit training, DME instructions, Aquatic Therapy, Dry Needling, Electrical stimulation, Cryotherapy, Moist heat, Taping, Ultrasound, Ionotophoresis 4mg /ml Dexamethasone, Manual  therapy,  Vasopneumatic device, Traction, Spinal manipulation, Spinal mobilization,Balance training, Gait training,   PLAN FOR NEXT SESSION:    Sedalia Muta, PT, DPT 1:02 PM  05/17/23

## 2023-05-18 ENCOUNTER — Ambulatory Visit (INDEPENDENT_AMBULATORY_CARE_PROVIDER_SITE_OTHER): Payer: Medicare Other

## 2023-05-18 ENCOUNTER — Ambulatory Visit: Payer: Medicare Other | Attending: Cardiology | Admitting: Cardiology

## 2023-05-18 ENCOUNTER — Inpatient Hospital Stay: Payer: Medicare Other | Attending: Hematology

## 2023-05-18 ENCOUNTER — Encounter: Payer: Self-pay | Admitting: Cardiology

## 2023-05-18 VITALS — BP 110/66 | HR 71 | Ht 60.0 in | Wt 150.2 lb

## 2023-05-18 DIAGNOSIS — R002 Palpitations: Secondary | ICD-10-CM

## 2023-05-18 DIAGNOSIS — I270 Primary pulmonary hypertension: Secondary | ICD-10-CM

## 2023-05-18 DIAGNOSIS — G4733 Obstructive sleep apnea (adult) (pediatric): Secondary | ICD-10-CM | POA: Diagnosis not present

## 2023-05-18 DIAGNOSIS — I10 Essential (primary) hypertension: Secondary | ICD-10-CM | POA: Diagnosis not present

## 2023-05-18 DIAGNOSIS — Z79899 Other long term (current) drug therapy: Secondary | ICD-10-CM

## 2023-05-18 DIAGNOSIS — E785 Hyperlipidemia, unspecified: Secondary | ICD-10-CM

## 2023-05-18 DIAGNOSIS — I471 Supraventricular tachycardia, unspecified: Secondary | ICD-10-CM

## 2023-05-18 DIAGNOSIS — Z853 Personal history of malignant neoplasm of breast: Secondary | ICD-10-CM | POA: Diagnosis not present

## 2023-05-18 DIAGNOSIS — I2510 Atherosclerotic heart disease of native coronary artery without angina pectoris: Secondary | ICD-10-CM

## 2023-05-18 DIAGNOSIS — D649 Anemia, unspecified: Secondary | ICD-10-CM | POA: Diagnosis not present

## 2023-05-18 DIAGNOSIS — E538 Deficiency of other specified B group vitamins: Secondary | ICD-10-CM | POA: Insufficient documentation

## 2023-05-18 DIAGNOSIS — Z01812 Encounter for preprocedural laboratory examination: Secondary | ICD-10-CM | POA: Diagnosis not present

## 2023-05-18 DIAGNOSIS — M8580 Other specified disorders of bone density and structure, unspecified site: Secondary | ICD-10-CM

## 2023-05-18 MED ORDER — CYANOCOBALAMIN 1000 MCG/ML IJ SOLN
1000.0000 ug | Freq: Once | INTRAMUSCULAR | Status: AC
  Administered 2023-05-18: 1000 ug via INTRAMUSCULAR
  Filled 2023-05-18: qty 1

## 2023-05-18 MED ORDER — ASPIRIN 81 MG PO TBEC: 81.0000 mg | DELAYED_RELEASE_TABLET | Freq: Every day | ORAL | 12 refills | Status: DC

## 2023-05-18 NOTE — Addendum Note (Signed)
Addended by: Rexene Edison L on: 05/18/2023 11:02 AM   Modules accepted: Orders

## 2023-05-18 NOTE — Progress Notes (Signed)
Cardiology Office Note:    Date:  05/18/2023   ID:  Madison Clements, DOB [birth year], MRN [id number]  PCP:  Willow Ora, MD  Cardiologist:  Armanda Magic, MD    Referring MD: Willow Ora, MD   Chief Complaint  Patient presents with   Coronary Artery Disease   Hypertension   Hyperlipidemia   Sleep Apnea     History of Present Illness:    Madison Clements is a 77 y.o. female with a hx of  hyperlipidemia, SVT/PACs/PVCs followed by EP, GERD, ASCAD, hypertension and pulmonary hypertension.  She had an echocardiogram done and 2018 in [place] which showed normal LV function with the EF 55 to 60%, grade 2 diastolic dysfunction with mild to moderate TR and mild pulmonary hypertension with RVSP 39 mmHg.  She has been on Lasix 40 mg daily.  Cardiac catheterization done in 2016 showed mild CAD with calcified vessels.    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  2D echo done 04/05/2022 showed normal LV function with EF 60 to 65% and 1 diastolic dysfunction, mild to moderate TR and normal RVSP at 29 mmHg.   Her last echo done 05/11/2023 showed EF 60 to 65% and normal RV size and function but severe TR.  PA systolic pressure could not be estimated because of the eccentricity of the TR jet.  She had a home sleep study done showing moderate OSA with AHI 18/hr and O2 sats as low as 72%.  She became very frustrated with her CPAP and wanted to be evaluated for the inspire device and was referred to ENT and felt to be a good candidate for inspire and was scheduled for a sleep induced endoscopy.  Unfortunately she was diagnosed with breast cancer in the interim and the sleep induced endoscopy was postponed.  She underwent lumpectomy on 09/09/2021 s/p XRT.  She decided to stay on CPAP for now.  SHe is doing well with her PAP device and thinks that she has gotten  used to it.  She tolerates the mask and feels the pressure is adequate.  Since going on PAP she feels rested in the am and has no significant daytime sleepiness.  She denies any significant mouth or nasal dryness or nasal congestion.  She does not think that she snores.    She is here today for followup.  She has chronic DOE with walking up stairs and across the parking lot.  She thinks though that the has gotten worse and is limiting her ADLs.  She is very frustrated with how she feels.  She denies any chest pain or pressure, PND, orthopnea, LE edema, dizziness or syncope.  She continues to have palpitations more so at night. She does not feel them at night and only notices that her apple watch shows HR in the 120's.  During the day she says that she notices the palpitations mainly after she has done activities like ADLs and is regular but is very cardio aware of her heart beat and this usually coincides with when she is very short of breath with minimal exertional activities.  She was started on Toprol in addition of the Cardizem in the fall 2023 but did not tolerate it and her Cardizem was increased. She is compliant with her meds and is tolerating meds with no SE.    Past Medical History:  Diagnosis  Date   Allergy 1990   Morphine following surgery   Arthritis    generalized   Atypical mole 02/24/2015   RGHT LATERAL THIGH MODERATE   Atypical mole 01/20/2022   Left Breast (mild)   Atypical mole 01/20/2022   Mid Back (mild)   Atypical nevi 02/24/2015   RIGHT NECK MILD   Atypical nevi 08/25/2015   LEFT UPPER ARM MILD   Atypical nevi 08/25/2015   LEFT LATERAL FOREARM MILD/FREE   Atypical nevi 02/23/2016   LEFT THIGH MODERATE/FREE   Atypical nevi 02/23/2016   LEFT MEDIAL LEG MODERATE/FREE   Atypical nevi 02/23/2016   LEFT UPPER BACK MILD /FREE   Atypical nevi 08/23/2016   RIGHT ANT PROX THIGH MILD /FREE   Atypical nevi 07/25/2018   MID LOWER BACK MODERATE   Atypical nevi 01/22/2019    LEFT MID BACK MILD /FREE   Atypical nevi 01/22/2019   LEFT NECK MILD   Atypical nevi 08/06/2019   LEFT INNER BREAST MODERATE W/S   Atypical nevi 08/06/2019   LEFT OUTER SIDE MILD/FREE   Blood transfusion without reported diagnosis    hx of   Breast cancer (HCC) 2022   CAD (coronary artery disease), native coronary artery 02/07/2019   Minimal CAD at the time of cath in 2016 in [facility]   Family history of breast cancer 08/19/2021   Family history of pancreatic cancer 08/19/2021   Fatty liver 05/21/2020   Gastric polyps 10/02/2018   EGD 03/2015; benign   GERD (gastroesophageal reflux disease) 10/02/2018   on meds   History of melanoma 10/02/2018   Abdomen 2012; 2013; s/p local excisions.    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 DISTAL LOWER LEG TREATED BY DR. Leda Quail   Sleep apnea    Sleep apnea 05/26/2020   uses CPAP   Tricuspid regurgitation    moderate by echo 04/2023    Past Surgical History:  Procedure Laterality Date   ABDOMINAL HYSTERECTOMY  1990   APPENDECTOMY  1962   BLADDER SUSPENSION  1990   BREAST LUMPECTOMY WITH RADIOACTIVE SEED LOCALIZATION Left 09/09/2021   Procedure: LEFT BREAST LUMPECTOMY WITH RADIOACTIVE SEED LOCALIZATION X2;  Surgeon: Manus Rudd, MD;  Location: [facility];  Service: General;  Laterality: Left;   COLONOSCOPY  2016   in [place]-hx of polyps   EYE SURGERY  2013 torn retina laser   MENISCUS REPAIR Right 2001   right knee   thumb surgery Left    TONSILLECTOMY AND ADENOIDECTOMY     tuabl ligation     TUBAL LIGATION  1975    WISDOM TOOTH EXTRACTION      Current Medications: Current Meds  Medication Sig   celecoxib (CELEBREX) 100 MG capsule TAKE 1 CAPSULE EVERY DAY   Cholecalciferol (VITAMIN D3) 50 MCG (2000 UT) capsule Take 2,000 Units by mouth daily. Pt takes every other day   diclofenac Sodium (VOLTAREN) 1 % GEL    diltiazem (CARDIZEM CD) 240 MG 24 hr capsule TAKE 1 CAPSULE EVERY DAY   folic acid (FOLVITE)  400 MCG tablet Take 400 mcg by mouth daily.   furosemide (LASIX) 40 MG tablet Take 1 tablet (40 mg total) daily. Take an extra 1/2 tablet (20 mg total) as needed.   lisinopril (ZESTRIL) 10 MG tablet Take 1 tablet (10 mg total) by mouth daily.   omeprazole (PRILOSEC) 20 MG capsule TAKE 1 CAPSULE EVERY DAY   Prenatal Vit-Fe Fumarate-FA (PRENATAL VITAMIN PO) Take by mouth daily. Prenatal w/folic acid po daily   Probiotic Product (ADVANCED PROBIOTIC PO) Take 1 capsule by mouth daily.   rosuvastatin (CRESTOR) 40 MG tablet Take 1 tablet (40 mg total) by mouth at bedtime.   Specialty Vitamins Products (ONE-A-DAY BONE STRENGTH PO) Take 1 tablet by mouth daily.    triamcinolone cream (KENALOG) 0.1 % Apply 1 Application topically 2 (two) times daily. For 2 weeks, then as needed     Allergies:   Morphine and Morphine and codeine   Social History   Socioeconomic History   Marital status: Widowed    Spouse name: Not on file   Number of children: 1   Years of education: Not on file   Highest education level: Some college, no degree  Occupational History   Occupation: Retired [employer]  Tobacco Use   Smoking status: Former    Current packs/day: 0.00    Average packs/day: 0.3 packs/day for 10.0 years (2.5 ttl pk-yrs)    Types: Cigarettes    Start date: 09/12/1986    Quit date: 09/12/1996    Years since quitting: 26.6   Smokeless tobacco: Never  Vaping Use   Vaping status: Never Used  Substance and Sexual Activity   Alcohol use: Yes    Alcohol/week: 10.0 standard drinks of alcohol    Types: 5 Glasses of wine, 5  Shots of liquor per week    Comment: occassional    Drug use: Never   Sexual activity: Not Currently    Birth control/protection: Abstinence, Post-menopausal    Comment: hysterectomy  Other Topics Concern   Not on file  Social History Narrative   Widowed 2018 after 40 years of marriage   One daughter, 2 grandchildren. Lives in [place]   Sister lives in same complex in [place]    Previously lived in [place], [place]   Social Determinants of Health   Financial Resource Strain: Low Risk  (04/10/2023)   Overall Financial Resource Strain (CARDIA)    Difficulty of Paying Living Expenses: Not hard at all  Food Insecurity: No Food Insecurity (04/10/2023)   Hunger Vital Sign    Worried About Running Out of Food in the Last Year: Never true    Ran Out of Food in the Last Year: Never true  Transportation Needs: No Transportation Needs (04/10/2023)   PRAPARE - Administrator, Civil Service (Medical): No    Lack of Transportation (Non-Medical): No  Physical Activity: Inactive (04/10/2023)   Exercise Vital Sign    Days of Exercise per Week: 0 days    Minutes of Exercise per Session: 10 min  Stress: No Stress Concern Present (04/10/2023)   [employer] of Occupational Health - Occupational Stress Questionnaire    Feeling of Stress : Not at all  Social Connections: Moderately Integrated (04/10/2023)   Social Connection and Isolation Panel [NHANES]    Frequency of Communication with Friends and Family: More than three times a week    Frequency of Social Gatherings with Friends and Family: More than three times a week    Attends Religious Services: More  than 4 times per year    Active Member of Clubs or Organizations: Yes    Attends Banker Meetings: More than 4 times per year    Marital Status: Widowed     Family History: The patient's family history includes Arthritis in her mother, paternal grandmother, and sister; Asthma in her maternal grandfather and paternal  grandfather; Breast cancer (age of onset: 1) in her sister; COPD in her paternal grandfather; Diabetes in her sister; Early death in her maternal grandmother; Heart attack in her father; Heart disease in her father, maternal uncle, maternal uncle, and paternal grandmother; High Cholesterol in her sister; High blood pressure in her father, maternal grandmother, mother, paternal grandmother, and sister; Hyperlipidemia in her mother; Hypertension in her father, maternal grandmother, mother, and paternal grandmother; Kidney disease in her maternal grandfather and paternal grandfather; Pancreatic cancer in her maternal aunt; Prostate cancer in her cousin; Stroke in her maternal grandmother, mother, and paternal aunt; Varicose Veins in her mother and sister. There is no history of Colon polyps, Colon cancer, Esophageal cancer, Rectal cancer, or Stomach cancer.  ROS:   Please see the history of present illness.    ROS  All other systems reviewed and negative.   EKGs/Labs/Other Studies Reviewed:    The following studies were reviewed today: none  EKG:  EKG is ordered today.  The ekg ordered today demonstrates NSR with iRBBB  Recent Labs: 04/24/2023: ALT 26; BUN 24; Creatinine 1.18; Hemoglobin 11.8; Platelet Count 223; Potassium 3.8; Sodium 135   Recent Lipid Panel    Component Value Date/Time   CHOL 151 03/08/2023 1038   TRIG 218 (H) 03/08/2023 1038   HDL 55 03/08/2023 1038   CHOLHDL 2.7 03/08/2023 1038   CHOLHDL 3 07/09/2021 0935   VLDL 27.0 07/09/2021 0935   LDLCALC 61 03/08/2023 1038   LDLCALC 55 03/09/2021 0914   LDLDIRECT 68.0 01/14/2020 0904    Physical Exam:    VS:  BP 110/66   Pulse 71   Ht 5' (1.524 m)   Wt 150 lb 3.2 oz (68.1 kg)   SpO2 98%   BMI 29.33 kg/m     Wt Readings from Last 3 Encounters:  05/18/23 150 lb 3.2 oz (68.1 kg)  04/28/23 152 lb (68.9 kg)  04/24/23 151 lb 14.4 oz (68.9 kg)    GEN: Well nourished, well developed in no acute distress HEENT:  Normal NECK: No JVD; No carotid bruits LYMPHATICS: No lymphadenopathy CARDIAC:RRR, no murmurs, rubs, gallops RESPIRATORY:  Clear to auscultation without rales, wheezing or rhonchi  ABDOMEN: Soft, non-tender, non-distended MUSCULOSKELETAL:  No edema; No deformity  SKIN: Warm and dry NEUROLOGIC:  Alert and oriented x 3 PSYCHIATRIC:  Normal affect   ASSESSMENT:    1. Pulmonary hypertension, primary (HCC)   2. Coronary artery disease involving native coronary artery of native heart without angina pectoris   3. Hyperlipidemia LDL goal <70   4. Essential hypertension   5. OSA (obstructive sleep apnea)   6. SVT (supraventricular tachycardia)     PLAN:    In order of problems listed above:  1.  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 -sleep study showed moderate OSA with AHI 18/hr and O2 sats as low as 72%>>now on CPAP followed by Pulmonary -echo 03/2022 showed normalization of PA pressures (RVSP ) with treatment of sleep apnea on  CPAP -Repeat echo 05/11/2023 with EF 60 to 65%, normal RV size and function.  Echo read out as severe TR but unable to assess PA pressure due to eccentricity of TR jet.  I reviewed the images and felt the TR was only moderate.  -She does not appear overloaded on exam today but her SOB has become very bothersome and limits her ADLs. -continue CPAP therapy -Continue prescription drug management with Lasix 40 mg daily with as needed refills -I have personally reviewed and interpreted outside labs performed by patient's PCP which showed serum creatinine 1.18 and potassium 3.8 on 04/24/2023 -Repeat 2D echo in 6 months -I think at this time that we need to proceed with right and left heart cath to assess Right sided filling pressures as well as progression of CAD since her SOB has become very limiting. -Informed  Consent   Shared Decision Making/Informed Consent The risks [stroke (1 in 1000), death (1 in 1000), kidney failure [usually temporary] (1 in 500), bleeding (1 in 200), allergic reaction [possibly serious] (1 in 200)], benefits (diagnostic support and management of coronary artery disease) and alternatives of a cardiac catheterization were discussed in detail with Madison Clements and she is willing to proceed.    2. ASCAD -mild by cath in 2016 in [facility].   -Nuclear stress test 03/2020 for coronary artery calcifications noted on chest CT year was normal.   -coronary CTA 2023 showed coronary Ca score of 344 with 50-69% mid LAD, 25-49% proximal and distal LAD, <25% proximal to mid LCx and proximal to mid RCA -She denies CP but her SOB seems to have gotten worse and limits her ADLs -Continue drug management with statin therapy.  I recommended she also start a baby aspirin 81 mg daily -see above>>plan for right and left heart cath to redefine coronary anatomy  3.  Hyperlipidemia  -LDL goal < 70 -I have personally reviewed and interpreted outside labs performed by patient's PCP which showed LDL 61 and HDL 55 on 03/08/2023 -Continue prescription drug management with crestor 40 mg daily with as needed refills  4.  Hypertension  -BP well-controlled on current medical regimen -Continue drug management with Cardizem CD 240 mg daily and lisinopril 10 mg daily with as needed refills -I have personally reviewed and interpreted outside labs performed by patient's PCP which showed serum creatinine 1.18 and potassium 3.8 on 04/24/2023  5.  OSA - The patient is tolerating PAP therapy well without any problems.  I will get a download from her PCP the patient has been using and benefiting from PAP use and will continue to benefit from therapy.   6. SVT/PVCs/PACs -Followed by EP -she has been having more palpitations -Continue prescription drug management with Cardizem CD 240 mg daily with as needed  refills>>cannot increase CCB further due to borderline soft BP -will repeat 2 week ziopatch to reassess arrhythmias  Followup with me in 1 year   Time Spent: 25 minutes total time of encounter, including 15 minutes spent in face-to-face patient care on the date of this encounter. This time includes coordination of care and counseling regarding above mentioned problem list. Remainder of non-face-to-face time involved reviewing chart documents/testing relevant to the patient encounter and documentation in the medical record. I have independently reviewed documentation from referring provider  Medication Adjustments/Labs and Tests Ordered: Current medicines are reviewed at length with the patient today.  Concerns regarding medicines are outlined above.  Orders Placed This Encounter  Procedures   EKG 12-Lead  No orders of the defined types were placed in this encounter.   Signed, Armanda Magic, MD  05/18/2023 9:58 AM    [facility]

## 2023-05-18 NOTE — Progress Notes (Unsigned)
Enrolled patient for a 14 day Zio XT  monitor to be mailed to patients home  °

## 2023-05-18 NOTE — Patient Instructions (Addendum)
Medication Instructions:  Please START taking 81 mg aspirin daily. This dose is available over-the-counter.  *If you need a refill on your cardiac medications before your next appointment, please call your pharmacy*   Lab Work: Please complete a CBC and CMP in our lab in one week.  If you have labs (blood work) drawn today and your tests are completely normal, you will receive your results only by: MyChart Message (if you have MyChart) OR A paper copy in the mail If you have any lab test that is abnormal or we need to change your treatment, we will call you to review the results.   ZIO XT- Long Term Monitor Instructions  Your physician has requested you wear a ZIO patch monitor for 14 days.  This is a single patch monitor. [employer] supplies one patch monitor per enrollment. Additional stickers are not available. Please do not apply patch if you will be having a Nuclear Stress Test,  Echocardiogram, Cardiac CT, MRI, or Chest Xray during the period you would be wearing the  monitor. The patch cannot be worn during these tests. You cannot remove and re-apply the  ZIO XT patch monitor.  Your ZIO patch monitor will be mailed 3 day USPS to your address on file. It may take 3-5 days  to receive your monitor after you have been enrolled.  Once you have received your monitor, please review the enclosed instructions. Your monitor  has already been registered assigning a specific monitor serial # to you.  Billing and Patient Assistance Program Information  We have supplied [employer] with any of your insurance information on file for billing purposes. [employer] offers a sliding scale Patient Assistance Program for patients that do not have  insurance, or whose insurance does not completely cover the cost of the ZIO monitor.  You must apply for the Patient Assistance Program to qualify for this discounted rate.  To apply, please call [employer] at [phone], select option 4, select option 2, ask to  apply for  Patient Assistance Program. Madison Clements will ask your household income, and how many people  are in your household. They will quote your out-of-pocket cost based on that information.  [employer] will also be able to set up a 3-month, interest-free payment plan if needed.  Applying the monitor   Shave hair from upper left chest.  Hold abrader disc by orange tab. Rub abrader in 40 strokes over the upper left chest as  indicated in your monitor instructions.  Clean area with 4 enclosed alcohol pads. Let dry.  Apply patch as indicated in monitor instructions. Patch will be placed under collarbone on left  side of chest with arrow pointing upward.  Rub patch adhesive wings for 2 minutes. Remove white label marked "1". Remove the white  label marked "2". Rub patch adhesive wings for 2 additional minutes.  While looking in a mirror, press and release button in center of patch. A small green light will  flash 3-4 times. This will be your only indicator that the monitor has been turned on.  Do not shower for the first 24 hours. You may shower after the first 24 hours.  Press the button if you feel a symptom. You will hear a small click. Record Date, Time and  Symptom in the Patient Logbook.  When you are ready to remove the patch, follow instructions on the last 2 pages of Patient  Logbook. Stick patch monitor onto the last page of Patient Logbook.  Place Patient Logbook in  the blue and white box. Use locking tab on box and tape box closed  securely. The blue and white box has prepaid postage on it. Please place it in the mailbox as  soon as possible. Your physician should have your test results approximately 7 days after the  monitor has been mailed back to [facility].  Call [facility] Customer Care at [phone] if you have questions regarding  your ZIO XT patch monitor. Call them immediately if you see an orange light blinking on your  monitor.  If your monitor falls off in  less than 4 days, contact our Monitor department at [phone].  If your monitor becomes loose or falls off after 4 days call [employer] at [phone] for  suggestions on securing your monitor         Cardiac/Peripheral Catheterization   You are scheduled for a Cardiac Catheterization on Tuesday, October 8 with Dr. Marca Ancona.  1. Please arrive at the [facility] (Main Entrance A) at [facility]: [address] [place], [place] [address] at 5:30 AM (This time is 2 hour(s) before your procedure to ensure your preparation). Free valet parking service is available. You will check in at ADMITTING. The support person will be asked to wait in the waiting room.  It is OK to have someone drop you off and come back when you are ready to be discharged.        Special note: Every effort is made to have your procedure done on time. Please understand that emergencies sometimes delay scheduled procedures.  2. Diet: Do not eat solid foods after midnight.  You may have clear liquids until 5 AM the day of the procedure.  3. Labs: You will need to have blood drawn on Thursday, September 12 at [facility] at [facility] [address], [place]  Open: 7:30am - 5pm    Phone: [phone]. You do not need to be fasting.  4. On the morning of your procedure, take Aspirin 81 mg and any morning medicines. You may use sips of water.  5. Plan to go home the same day, you will only stay overnight if medically necessary. 6. You MUST have a responsible adult to drive you home. 7. An adult MUST be with you the first 24 hours after you arrive home. 8. Bring a current list of your medications, and the last time and date medication taken. 9. Bring ID and current insurance cards. 10.Please wear clothes that are easy to get on and off and wear slip-on shoes.  Thank you for allowing [place] to care for you!   -- [facility]    Follow-Up: At [facility], you and your health needs are our priority.  As part of our continuing mission to provide you with exceptional heart care, we have created designated Provider Care Teams.  These Care Teams include your primary Cardiologist (physician) and Advanced Practice Providers (APPs -  Physician Assistants and Nurse Practitioners) who all work together to provide you with the care you need, when you need it.  We recommend signing up for the patient portal called "MyChart".  Sign up information is provided on this After Visit Summary.  MyChart is used to connect with patients for Virtual Visits (Telemedicine).  Patients are able to view lab/test results, encounter notes, upcoming appointments, etc.  Non-urgent messages can be sent to your provider as well.   To learn more about what you can do  with MyChart, go to [email or web address].    Your next appointment:   6 month(s)  Provider:   Armanda Magic, MD

## 2023-05-19 ENCOUNTER — Telehealth: Payer: Self-pay

## 2023-05-19 ENCOUNTER — Other Ambulatory Visit: Payer: Self-pay

## 2023-05-19 DIAGNOSIS — I071 Rheumatic tricuspid insufficiency: Secondary | ICD-10-CM

## 2023-05-19 NOTE — Telephone Encounter (Signed)
-----   Message from Armanda Magic sent at 05/11/2023  4:13 PM EDT ----- Echo showed normal heart function EF 60-65%. Also read out as severe leakiness of TV with normal RV and RA size.  I personally reviewed her echo images and I think her TR is moderate. Since her RV and RA are normal will continue to follow TR - repeat echo in 6 months

## 2023-05-23 DIAGNOSIS — R002 Palpitations: Secondary | ICD-10-CM

## 2023-05-24 ENCOUNTER — Ambulatory Visit (INDEPENDENT_AMBULATORY_CARE_PROVIDER_SITE_OTHER): Payer: Medicare Other | Admitting: Family Medicine

## 2023-05-24 ENCOUNTER — Encounter: Payer: Self-pay | Admitting: Physical Therapy

## 2023-05-24 ENCOUNTER — Encounter: Payer: Self-pay | Admitting: Family Medicine

## 2023-05-24 ENCOUNTER — Ambulatory Visit (INDEPENDENT_AMBULATORY_CARE_PROVIDER_SITE_OTHER): Payer: Medicare Other | Admitting: Physical Therapy

## 2023-05-24 VITALS — BP 136/54 | HR 70 | Temp 97.9°F | Ht 60.0 in | Wt 149.6 lb

## 2023-05-24 DIAGNOSIS — M7061 Trochanteric bursitis, right hip: Secondary | ICD-10-CM | POA: Diagnosis not present

## 2023-05-24 DIAGNOSIS — G8929 Other chronic pain: Secondary | ICD-10-CM

## 2023-05-24 DIAGNOSIS — M5459 Other low back pain: Secondary | ICD-10-CM

## 2023-05-24 DIAGNOSIS — M25561 Pain in right knee: Secondary | ICD-10-CM

## 2023-05-24 DIAGNOSIS — M7062 Trochanteric bursitis, left hip: Secondary | ICD-10-CM | POA: Diagnosis not present

## 2023-05-24 DIAGNOSIS — M25552 Pain in left hip: Secondary | ICD-10-CM

## 2023-05-24 DIAGNOSIS — M25551 Pain in right hip: Secondary | ICD-10-CM

## 2023-05-24 MED ORDER — TRIAMCINOLONE ACETONIDE 40 MG/ML IJ SUSP
40.0000 mg | Freq: Once | INTRAMUSCULAR | Status: AC
Start: 2023-05-24 — End: 2023-05-24
  Administered 2023-05-24: 40 mg via INTRAMUSCULAR

## 2023-05-24 NOTE — Progress Notes (Signed)
Subjective  CC:  Chief Complaint  Patient presents with   Hip Pain    HPI: Madison Clements is a 77 y.o. female who presents to the office today to address the problems listed above in the chief complaint. 77 w/o LBP due to multilevel DJD and h/o bilateral hip bursitis: see recent visits. Completed pred taper and physical therapy.  Overall much improved.  However still with some bilateral hip pain.  Has classic sxs: lateral hip pain worse lying on side. Pain with walking. Non radicular. No injury.  Has received steroid shots in the past but has been over a year.  Assessment  1. Greater trochanteric bursitis of both hips      Plan  Bilateral trochanteric bursitis: Bilateral steroid injections given.  Routine post procedures instructions given: Recommend ice and stretching.  Follow up: As scheduled 07/14/2023  No orders of the defined types were placed in this encounter.  No orders of the defined types were placed in this encounter.     I reviewed the patients updated PMH, FH, and SocHx.    Patient Active Problem List   Diagnosis Date Noted   Malignant neoplasm of overlapping sites of left breast in female, estrogen receptor positive (HCC) 08/16/2021    Priority: High   OSA (obstructive sleep apnea) 05/26/2020    Priority: High   Coronary artery disease involving native coronary artery of native heart without angina pectoris 02/07/2019    Priority: High   Essential hypertension 10/02/2018    Priority: High   Mixed hyperlipidemia 10/02/2018    Priority: High   Pulmonary hypertension, primary (HCC) 10/02/2018    Priority: High   History of melanoma 10/02/2018    Priority: High   Polyp of colon, adenomatous 10/02/2018    Priority: High   Primary osteoarthritis of right shoulder 04/11/2023    Priority: Medium    Hepatic steatosis 07/28/2020    Priority: Medium    GERD (gastroesophageal reflux disease) 10/02/2018    Priority: Medium    Osteoporosis of forearm  10/02/2018    Priority: Medium    Primary osteoarthritis involving multiple joints 10/02/2018    Priority: Medium    Gastric polyps 10/02/2018    Priority: Medium    Tricuspid regurgitation    Trochanteric bursitis of both hips 04/11/2023   Atrial tachycardia 02/09/2023   Osteopenia 09/19/2022   SVT (supraventricular tachycardia) 07/11/2022   Genetic testing 08/30/2021   Family history of breast cancer 08/19/2021   Family history of pancreatic cancer 08/19/2021   Current Meds  Medication Sig   aspirin EC 81 MG tablet Take 1 tablet (81 mg total) by mouth daily. Swallow whole.   celecoxib (CELEBREX) 100 MG capsule TAKE 1 CAPSULE EVERY DAY   Cholecalciferol (VITAMIN D3) 50 MCG (2000 UT) capsule Take 2,000 Units by mouth daily. Pt takes every other day   diclofenac Sodium (VOLTAREN) 1 % GEL    diltiazem (CARDIZEM CD) 240 MG 24 hr capsule TAKE 1 CAPSULE EVERY DAY   folic acid (FOLVITE) 400 MCG tablet Take 400 mcg by mouth daily.   furosemide (LASIX) 40 MG tablet Take 1 tablet (40 mg total) daily. Take an extra 1/2 tablet (20 mg total) as needed.   lisinopril (ZESTRIL) 10 MG tablet Take 1 tablet (10 mg total) by mouth daily.   omeprazole (PRILOSEC) 20 MG capsule TAKE 1 CAPSULE EVERY DAY   Prenatal Vit-Fe Fumarate-FA (PRENATAL VITAMIN PO) Take by mouth daily. Prenatal w/folic acid po daily   Probiotic  Product (ADVANCED PROBIOTIC PO) Take 1 capsule by mouth daily.   rosuvastatin (CRESTOR) 40 MG tablet Take 1 tablet (40 mg total) by mouth at bedtime.   Specialty Vitamins Products (ONE-A-DAY BONE STRENGTH PO) Take 1 tablet by mouth daily.    triamcinolone cream (KENALOG) 0.1 % Apply 1 Application topically 2 (two) times daily. For 2 weeks, then as needed    Allergies: Patient is allergic to morphine and morphine and codeine. Family History: Patient family history includes Arthritis in her mother, paternal grandmother, and sister; Asthma in her maternal grandfather and paternal grandfather;  Breast cancer (age of onset: 54) in her sister; COPD in her paternal grandfather; Diabetes in her sister; Early death in her maternal grandmother; Heart attack in her father; Heart disease in her father, maternal uncle, maternal uncle, and paternal grandmother; High Cholesterol in her sister; High blood pressure in her father, maternal grandmother, mother, paternal grandmother, and sister; Hyperlipidemia in her mother; Hypertension in her father, maternal grandmother, mother, and paternal grandmother; Kidney disease in her maternal grandfather and paternal grandfather; Pancreatic cancer in her maternal aunt; Prostate cancer in her cousin; Stroke in her maternal grandmother, mother, and paternal aunt; Varicose Veins in her mother and sister. Social History:  Patient  reports that she quit smoking about 26 years ago. Her smoking use included cigarettes. She started smoking about 36 years ago. She has a 2.5 pack-year smoking history. She has never used smokeless tobacco. She reports current alcohol use of about 10.0 standard drinks of alcohol per week. She reports that she does not use drugs.  Review of Systems: Constitutional: Negative for fever malaise or anorexia Cardiovascular: negative for chest pain Respiratory: negative for SOB or persistent cough Gastrointestinal: negative for abdominal pain  Objective  Vitals: BP (!) 136/54   Pulse 70   Temp 97.9 F (36.6 C)   Ht 5' (1.524 m)   Wt 149 lb 9.6 oz (67.9 kg)   SpO2 98%   BMI 29.22 kg/m  General: no acute distress , A&Ox3 Normal gait: Bilateral trochanteric bursa are tender, good range of motion back  GR Trochanteric Bursa steroid injection  Procedure Note   Pre-operative Diagnosis: bilateral hip bursitis   Post-operative Diagnosis: same   Indications: pain   Anesthesia: cold spray   Procedure Details    Verbal consent was obtained for the procedure. Universal time out done. The point of maximum tenderness was identified and  marked over the right hip bursa. The skin prepped with alcohol and cold spray used for anesthesia. A needle was advanced into the bursa and the steroid/lido (20mg  kenalog: 1.41ml lidocaine w/o epi) was administered easily.  The same procedure was then done on the left side.   Complications:  None; patient tolerated the procedure well.   Commons side effects, risks, benefits, and alternatives for medications and treatment plan prescribed today were discussed, and the patient expressed understanding of the given instructions. Patient is instructed to call or message via MyChart if he/she has any questions or concerns regarding our treatment plan. No barriers to understanding were identified. We discussed Red Flag symptoms and signs in detail. Patient expressed understanding regarding what to do in case of urgent or emergency type symptoms.  Medication list was reconciled, printed and provided to the patient in AVS. Patient instructions and summary information was reviewed with the patient as documented in the AVS. This note was prepared with assistance of Dragon voice recognition software. Occasional wrong-word or sound-a-like substitutions may have occurred due to the  inherent limitations of voice recognition software

## 2023-05-24 NOTE — Therapy (Signed)
OUTPATIENT PHYSICAL THERAPY LOWER EXTREMITY TREATMENT    Patient Name: Madison Clements MRN: [id number] DOB:December 25, 1945, 77 y.o., female Today's Date: 05/24/2023  END OF SESSION:  PT End of Session - 05/24/23 1032     Visit Number 3    Number of Visits 16    Date for PT Re-Evaluation 07/07/23    Authorization Type Medicare    PT Start Time 1030    PT Stop Time 1108    PT Time Calculation (min) 38 min    Activity Tolerance Patient tolerated treatment well    Behavior During Therapy WFL for tasks assessed/performed             Past Medical History:  Diagnosis Date   Allergy 1990   Morphine following surgery   Arthritis    generalized   Atypical mole 02/24/2015   RGHT LATERAL THIGH MODERATE   Atypical mole 01/20/2022   Left Breast (mild)   Atypical mole 01/20/2022   Mid Back (mild)   Atypical nevi 02/24/2015   RIGHT NECK MILD   Atypical nevi 08/25/2015   LEFT UPPER ARM MILD   Atypical nevi 08/25/2015   LEFT LATERAL FOREARM MILD/FREE   Atypical nevi 02/23/2016   LEFT THIGH MODERATE/FREE   Atypical nevi 02/23/2016   LEFT MEDIAL LEG MODERATE/FREE   Atypical nevi 02/23/2016   LEFT UPPER BACK MILD /FREE   Atypical nevi 08/23/2016   RIGHT ANT PROX THIGH MILD /FREE   Atypical nevi 07/25/2018   MID LOWER BACK MODERATE   Atypical nevi 01/22/2019   LEFT MID BACK MILD /FREE   Atypical nevi 01/22/2019   LEFT NECK MILD   Atypical nevi 08/06/2019   LEFT INNER BREAST MODERATE W/S   Atypical nevi 08/06/2019   LEFT OUTER SIDE MILD/FREE   Blood transfusion without reported diagnosis    hx of   Breast cancer (HCC) 2022   CAD (coronary artery disease), native coronary artery 02/07/2019   Minimal CAD at the time of cath in 2016 in [facility]   Family history of breast cancer 08/19/2021   Family history of pancreatic cancer 08/19/2021   Fatty liver 05/21/2020   Gastric polyps 10/02/2018   EGD 03/2015; benign   GERD (gastroesophageal reflux disease)  10/02/2018   on meds   History of melanoma 10/02/2018   Abdomen 2012; 2013; s/p local excisions.    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 DISTAL LOWER LEG TREATED BY DR. Leda Quail   Sleep apnea    Sleep apnea 05/26/2020   uses CPAP   Tricuspid regurgitation    moderate by echo 04/2023   Past Surgical History:  Procedure Laterality Date   ABDOMINAL HYSTERECTOMY  1990   APPENDECTOMY  1962   BLADDER SUSPENSION  1990   BREAST LUMPECTOMY WITH RADIOACTIVE SEED LOCALIZATION Left 09/09/2021   Procedure: LEFT BREAST LUMPECTOMY WITH RADIOACTIVE SEED LOCALIZATION X2;  Surgeon: Manus Rudd, MD;  Location: [facility];  Service: General;  Laterality: Left;   COLONOSCOPY  2016   in [place]-hx of polyps   EYE  SURGERY  2013 torn retina laser   MENISCUS REPAIR Right 2001   right knee   thumb surgery Left    TONSILLECTOMY AND ADENOIDECTOMY     tuabl ligation     TUBAL LIGATION  1975   WISDOM TOOTH EXTRACTION     Patient Active Problem List   Diagnosis Date Noted   Tricuspid regurgitation    Primary osteoarthritis of right shoulder 04/11/2023   Trochanteric bursitis of both hips 04/11/2023   Atrial tachycardia 02/09/2023   Osteopenia 09/19/2022   SVT (supraventricular tachycardia) 07/11/2022   Genetic testing 08/30/2021   Family history of breast cancer 08/19/2021   Family history of pancreatic cancer 08/19/2021   Malignant neoplasm of overlapping sites of left breast in female, estrogen receptor positive (HCC) 08/16/2021   Hepatic steatosis 07/28/2020   OSA (obstructive sleep apnea)  05/26/2020   Coronary artery disease involving native coronary artery of native heart without angina pectoris 02/07/2019   GERD (gastroesophageal reflux disease) 10/02/2018   Essential hypertension 10/02/2018   Mixed hyperlipidemia 10/02/2018   Osteoporosis of forearm 10/02/2018   Primary osteoarthritis involving multiple joints 10/02/2018   Pulmonary hypertension, primary (HCC) 10/02/2018   History of melanoma 10/02/2018   Polyp of colon, adenomatous 10/02/2018   Gastric polyps 10/02/2018    PCP: Asencion Partridge   REFERRING PROVIDER: Asencion Partridge   REFERRING DIAG: bil hip pain, low back pain, R knee pain   THERAPY DIAG:  Other low back pain  Bilateral hip pain  Chronic pain of right knee  Rationale for Evaluation and Treatment: Rehabilitation  ONSET DATE:    SUBJECTIVE:   SUBJECTIVE STATEMENT: Pt just had MD appt,  had an injection in each hip. States less pain in back this week. Hips still sore.  Also wearing heart monitor, and will have heart cath in October. Has not yet been cleared by ortho for knee surgery.    Eval: Pt states several areas of pain. She has pain and OA in R knee, and is scheduled for R TKA in  November. L knee non painful at this time. She also has pain in Low back: ongoing for about 6 months. Prednisone helped, also taking tylenol arthritis, up to 4/day and topical creams. States increased pain with Increased activity, walking and standing. Pain location is  Very low, center of back. Sitting more comfortable. Not currently doing any exercise for back. Hips are both painful L and R. Flare up from time to time. Has had injections in the past. But no PT.    PERTINENT HISTORY: Breast CA 2022, new heart valve issue. , CAD,  PAIN:  Are you having pain? Yes: NPRS scale: 6/10 Pain location: back  Pain description: sore , tight  Aggravating factors: standing, walking  Relieving factors: sitting, rest   Are you having pain? Yes: NPRS scale: 5/10 Pain  location: Bil hips  Pain description: sore  Aggravating factors: standing, walking, activity  Relieving factors: rest   Are you having pain? Yes: NPRS scale: 0-5 /10 Pain location: R knee  Pain description: sore  Aggravating factors: increased activity  Relieving factors: none stated   PRECAUTIONS: None  WEIGHT BEARING RESTRICTIONS: No  FALLS:  Has patient fallen in last 6 months? No 1 fall 1 year ago, at home.  No injury   PLOF: Independent  PATIENT GOALS:  Decreased pain   NEXT MD VISIT:   OBJECTIVE:   DIAGNOSTIC FINDINGS:   PATIENT SURVEYS:  Fotot- low back-  initial:  51.6  COGNITION:  Overall cognitive status: Within functional limits for tasks assessed     SENSATION: WFL  EDEMA:   POSTURE:    No Significant postural limitations  PALPATION:  Tender to palpate bil hip- greater trochanters Tenderness in L4/5 region centrally, and into bil SI, top of sacrum, and into bil glutes    LOWER EXTREMITY ROM:  Hips: WFL Back: [facility]  Active /Passive ROM Left eval Right eval  Hip flexion    Hip extension    Hip abduction    Hip adduction    Hip internal rotation    Hip external rotation    Knee flexion wfl 110  Knee extension  -15  Ankle dorsiflexion    Ankle plantarflexion    Ankle inversion    Ankle eversion     (Blank rows = not tested)  LOWER EXTREMITY MMT:  MMT Left eval Right  eval  Hip flexion 4  4  Hip extension    Hip abduction 4 4  Hip adduction    Hip internal rotation    Hip external rotation    Knee flexion 5 4  Knee extension 5 4  Ankle dorsiflexion    Ankle plantarflexion    Ankle inversion    Ankle eversion     (Blank rows = not tested)   LOWER EXTREMITY SPECIAL TESTS:    GAIT: Distance walked: 100 ft  Assistive device utilized: None Level of assistance: Complete Independence Comments: varus position of R knee, mild increase in lateral displacement   TODAY'S TREATMENT:                                                                                                                               DATE:   05/24/23. Therapeutic Exercise: Aerobic: Supine:  pelvic tilts x 20;  SLR 2 x 5 bil;   TA with education on optimal contraction, with breathing 2 x 10;  quad sets- long sitting 2 x 10;  Seated:   LAQ 2.5 lb x 15 bil;  Standing: Stretches:    Hip ER fallouts x 10;  modified piriformis 20 sec x 3 bil;  Neuromuscular Re-education: Manual Therapy: Self Care:   Therapeutic Exercise: Aerobic: Supine:  pelvic tilts x 20;  SLR 2 x 5 bil;   TA with education on optimal contraction, with breathing 2 x 10;  S/L: hip abd x10 bil    clams   x 10 bil;  Seated:  education on optimal seated position, neutral pelvis, and hip hinge motion.  Sit to stand 2 x 5;  Standing: Stretches:   SKTC 30 sec x 3 bil;   Hip ER fallouts x 10;  Neuromuscular Re-education: Manual Therapy: PROM for R knee flex and ext,  Therapeutic Activity: Self Care:    PATIENT EDUCATION:  Education details: updated and reviewed HEP, added quad sets  Person educated: Patient Education method: Explanation, Demonstration, Tactile cues, Verbal cues, and Handouts Education comprehension: verbalized understanding, returned demonstration, verbal  cues required, tactile cues required, and needs further education   HOME EXERCISE PROGRAM: Access Code: [email or web address]   ASSESSMENT:  CLINICAL IMPRESSION: 05/24/2023 Pt with decreased pain in back from last week. She just had injections in hips prior to PT appt today. Tolerated light hip mobility well. More focus today for education on R knee Strength/HEP, as well as core activation. Pt challenged with core activation but improved after education and practice today. Will benefit from continued strength for core, back, hips and R knee.   Eval: Patient presents with primary complaint of  pain in low back. She has pain in low lumbar and sacral region. She also has  pain in bil hips, consistent with bursitis.  R knee has significant OA and she is scheduled for replacement in November. She has ROM loss for flexion and extension in knee, as well as mild gait deficits. Hip pain likely influenced by knee dysfunction and gait deficits. Pt with pain in multiple areas, and has lack of effective HEP for management. Pt with decreased ability for full functional activities. Pt will  benefit from skilled PT to improve deficits and pain and to return to PLOF.   OBJECTIVE IMPAIRMENTS: Abnormal gait, decreased activity tolerance, decreased mobility, decreased ROM, decreased strength, increased muscle spasms, improper body mechanics, and pain.   ACTIVITY LIMITATIONS: lifting, bending, sitting, standing, squatting, stairs, transfers, hygiene/grooming, and locomotion level  PARTICIPATION LIMITATIONS: meal prep, cleaning, laundry, shopping, and community activity  PERSONAL FACTORS: Time since onset of injury/illness/exacerbation and 1 comorbidity: multiple pain locations  are also affecting patient's functional outcome.   REHAB POTENTIAL: Good  CLINICAL DECISION MAKING: Stable/uncomplicated  EVALUATION COMPLEXITY: Low   GOALS: Goals reviewed with patient? Yes   SHORT TERM GOALS: Target date: 05/26/2023   Pt to be independent with initial HEP  Goal status: INITIAL   LONG TERM GOALS: Target date: 07/07/2023  Pt to be independent with final HEP  Goal status: INITIAL  2.  Pt to report improved hip strength to 0-2/10 with activity   Goal status: INITIAL  3.  Pt to report decreased pain in low back to 0-3/10 with standing activity .   Goal status: INITIAL  4.  Pt to demo R knee being at max capacity for ROM and strength, to ensure best pre-op function.   Goal status: INITIAL     PLAN:  PT FREQUENCY: 1-2x/week  PT DURATION: 8 weeks  PLANNED INTERVENTIONS: Therapeutic exercises, Therapeutic activity, Neuromuscular re-education, Patient/Family education, Self Care, Joint mobilization, Joint  manipulation, Stair training, Orthotic/Fit training, DME instructions, Aquatic Therapy, Dry Needling, Electrical stimulation, Cryotherapy, Moist heat, Taping, Ultrasound, Ionotophoresis 4mg /ml Dexamethasone, Manual therapy,  Vasopneumatic device, Traction, Spinal manipulation, Spinal mobilization,Balance training, Gait training,   PLAN FOR NEXT SESSION:    Sedalia Muta, PT, DPT 12:05 PM  05/24/23

## 2023-05-24 NOTE — Patient Instructions (Signed)
Please follow up as scheduled for your next visit with me: 07/14/2023   If you have any questions or concerns, please don't hesitate to send me a message via MyChart or call the office at [phone]. Thank you for visiting with [place] today! It's our pleasure caring for you.   You had a steroid injection today.   Things to be aware of after this injection are listed below: You may experience no significant improvement or even a slight worsening in your symptoms during the first 24 to 48 hours.  After that we expect your symptoms to improve gradually over the next 2 weeks for the medicine to have its maximal effect.  You should continue to have improvement out to 6 weeks after your injection. I recommend icing the site of the injection for 20 minutes  1-2 times the day of your injection You may shower but no swimming, tub bath or Jacuzzi for 24 hours. If your bandage falls off this does not need to be replaced.  It is appropriate to remove the bandage after 4 hours. You may resume light activities as tolerated.     POSSIBLE PROCEDURE SIDE EFFECTS: The side effects of the injection are usually fairly minimal however if you may experience some of the following side effects that are usually self-limited and will is off on their own.  If you are concerned please feel free to call the office with questions:             Increased numbness or tingling             Nausea or vomiting             Swelling or bruising at the injection site    Please call our office if if you experience any of the following symptoms over the next week as these can be signs of infection:              Fever greater than 100.55F             Significant swelling at the injection site             Significant redness or drainage from the injection site

## 2023-05-24 NOTE — H&P (View-Only) (Signed)
Subjective  CC:  Chief Complaint  Patient presents with   Hip Pain    HPI: Madison Clements is a 77 y.o. female who presents to the office today to address the problems listed above in the chief complaint. 77 w/o LBP due to multilevel DJD and h/o bilateral hip bursitis: see recent visits. Completed pred taper and physical therapy.  Overall much improved.  However still with some bilateral hip pain.  Has classic sxs: lateral hip pain worse lying on side. Pain with walking. Non radicular. No injury.  Has received steroid shots in the past but has been over a year.  Assessment  1. Greater trochanteric bursitis of both hips      Plan  Bilateral trochanteric bursitis: Bilateral steroid injections given.  Routine post procedures instructions given: Recommend ice and stretching.  Follow up: As scheduled 07/14/2023  No orders of the defined types were placed in this encounter.  No orders of the defined types were placed in this encounter.     I reviewed the patients updated PMH, FH, and SocHx.    Patient Active Problem List   Diagnosis Date Noted   Malignant neoplasm of overlapping sites of left breast in female, estrogen receptor positive (HCC) 08/16/2021    Priority: High   OSA (obstructive sleep apnea) 05/26/2020    Priority: High   Coronary artery disease involving native coronary artery of native heart without angina pectoris 02/07/2019    Priority: High   Essential hypertension 10/02/2018    Priority: High   Mixed hyperlipidemia 10/02/2018    Priority: High   Pulmonary hypertension, primary (HCC) 10/02/2018    Priority: High   History of melanoma 10/02/2018    Priority: High   Polyp of colon, adenomatous 10/02/2018    Priority: High   Primary osteoarthritis of right shoulder 04/11/2023    Priority: Medium    Hepatic steatosis 07/28/2020    Priority: Medium    GERD (gastroesophageal reflux disease) 10/02/2018    Priority: Medium    Osteoporosis of forearm  10/02/2018    Priority: Medium    Primary osteoarthritis involving multiple joints 10/02/2018    Priority: Medium    Gastric polyps 10/02/2018    Priority: Medium    Tricuspid regurgitation    Trochanteric bursitis of both hips 04/11/2023   Atrial tachycardia 02/09/2023   Osteopenia 09/19/2022   SVT (supraventricular tachycardia) 07/11/2022   Genetic testing 08/30/2021   Family history of breast cancer 08/19/2021   Family history of pancreatic cancer 08/19/2021   Current Meds  Medication Sig   aspirin EC 81 MG tablet Take 1 tablet (81 mg total) by mouth daily. Swallow whole.   celecoxib (CELEBREX) 100 MG capsule TAKE 1 CAPSULE EVERY DAY   Cholecalciferol (VITAMIN D3) 50 MCG (2000 UT) capsule Take 2,000 Units by mouth daily. Pt takes every other day   diclofenac Sodium (VOLTAREN) 1 % GEL    diltiazem (CARDIZEM CD) 240 MG 24 hr capsule TAKE 1 CAPSULE EVERY DAY   folic acid (FOLVITE) 400 MCG tablet Take 400 mcg by mouth daily.   furosemide (LASIX) 40 MG tablet Take 1 tablet (40 mg total) daily. Take an extra 1/2 tablet (20 mg total) as needed.   lisinopril (ZESTRIL) 10 MG tablet Take 1 tablet (10 mg total) by mouth daily.   omeprazole (PRILOSEC) 20 MG capsule TAKE 1 CAPSULE EVERY DAY   Prenatal Vit-Fe Fumarate-FA (PRENATAL VITAMIN PO) Take by mouth daily. Prenatal w/folic acid po daily   Probiotic  Product (ADVANCED PROBIOTIC PO) Take 1 capsule by mouth daily.   rosuvastatin (CRESTOR) 40 MG tablet Take 1 tablet (40 mg total) by mouth at bedtime.   Specialty Vitamins Products (ONE-A-DAY BONE STRENGTH PO) Take 1 tablet by mouth daily.    triamcinolone cream (KENALOG) 0.1 % Apply 1 Application topically 2 (two) times daily. For 2 weeks, then as needed    Allergies: Patient is allergic to morphine and morphine and codeine. Family History: Patient family history includes Arthritis in her mother, paternal grandmother, and sister; Asthma in her maternal grandfather and paternal grandfather;  Breast cancer (age of onset: 54) in her sister; COPD in her paternal grandfather; Diabetes in her sister; Early death in her maternal grandmother; Heart attack in her father; Heart disease in her father, maternal uncle, maternal uncle, and paternal grandmother; High Cholesterol in her sister; High blood pressure in her father, maternal grandmother, mother, paternal grandmother, and sister; Hyperlipidemia in her mother; Hypertension in her father, maternal grandmother, mother, and paternal grandmother; Kidney disease in her maternal grandfather and paternal grandfather; Pancreatic cancer in her maternal aunt; Prostate cancer in her cousin; Stroke in her maternal grandmother, mother, and paternal aunt; Varicose Veins in her mother and sister. Social History:  Patient  reports that she quit smoking about 26 years ago. Her smoking use included cigarettes. She started smoking about 36 years ago. She has a 2.5 pack-year smoking history. She has never used smokeless tobacco. She reports current alcohol use of about 10.0 standard drinks of alcohol per week. She reports that she does not use drugs.  Review of Systems: Constitutional: Negative for fever malaise or anorexia Cardiovascular: negative for chest pain Respiratory: negative for SOB or persistent cough Gastrointestinal: negative for abdominal pain  Objective  Vitals: BP (!) 136/54   Pulse 70   Temp 97.9 F (36.6 C)   Ht 5' (1.524 m)   Wt 149 lb 9.6 oz (67.9 kg)   SpO2 98%   BMI 29.22 kg/m  General: no acute distress , A&Ox3 Normal gait: Bilateral trochanteric bursa are tender, good range of motion back  GR Trochanteric Bursa steroid injection  Procedure Note   Pre-operative Diagnosis: bilateral hip bursitis   Post-operative Diagnosis: same   Indications: pain   Anesthesia: cold spray   Procedure Details    Verbal consent was obtained for the procedure. Universal time out done. The point of maximum tenderness was identified and  marked over the right hip bursa. The skin prepped with alcohol and cold spray used for anesthesia. A needle was advanced into the bursa and the steroid/lido (20mg  kenalog: 1.41ml lidocaine w/o epi) was administered easily.  The same procedure was then done on the left side.   Complications:  None; patient tolerated the procedure well.   Commons side effects, risks, benefits, and alternatives for medications and treatment plan prescribed today were discussed, and the patient expressed understanding of the given instructions. Patient is instructed to call or message via MyChart if he/she has any questions or concerns regarding our treatment plan. No barriers to understanding were identified. We discussed Red Flag symptoms and signs in detail. Patient expressed understanding regarding what to do in case of urgent or emergency type symptoms.  Medication list was reconciled, printed and provided to the patient in AVS. Patient instructions and summary information was reviewed with the patient as documented in the AVS. This note was prepared with assistance of Dragon voice recognition software. Occasional wrong-word or sound-a-like substitutions may have occurred due to the  inherent limitations of voice recognition software

## 2023-05-25 ENCOUNTER — Ambulatory Visit: Payer: Medicare Other | Attending: Cardiology

## 2023-05-25 DIAGNOSIS — Z01812 Encounter for preprocedural laboratory examination: Secondary | ICD-10-CM

## 2023-05-25 DIAGNOSIS — Z79899 Other long term (current) drug therapy: Secondary | ICD-10-CM

## 2023-05-26 ENCOUNTER — Ambulatory Visit (INDEPENDENT_AMBULATORY_CARE_PROVIDER_SITE_OTHER): Payer: Medicare Other | Admitting: Physical Therapy

## 2023-05-26 ENCOUNTER — Encounter: Payer: Self-pay | Admitting: Physical Therapy

## 2023-05-26 DIAGNOSIS — M25552 Pain in left hip: Secondary | ICD-10-CM | POA: Diagnosis not present

## 2023-05-26 DIAGNOSIS — G8929 Other chronic pain: Secondary | ICD-10-CM | POA: Diagnosis not present

## 2023-05-26 DIAGNOSIS — M25551 Pain in right hip: Secondary | ICD-10-CM | POA: Diagnosis not present

## 2023-05-26 DIAGNOSIS — M25561 Pain in right knee: Secondary | ICD-10-CM

## 2023-05-26 DIAGNOSIS — M5459 Other low back pain: Secondary | ICD-10-CM | POA: Diagnosis not present

## 2023-05-26 LAB — CBC WITH DIFFERENTIAL/PLATELET
Basophils Absolute: 0 10*3/uL (ref 0.0–0.2)
Basos: 0 %
EOS (ABSOLUTE): 0 10*3/uL (ref 0.0–0.4)
Eos: 0 %
Hematocrit: 34.6 % (ref 34.0–46.6)
Hemoglobin: 11.3 g/dL (ref 11.1–15.9)
Immature Grans (Abs): 0.2 10*3/uL — ABNORMAL HIGH (ref 0.0–0.1)
Immature Granulocytes: 2 %
Lymphocytes Absolute: 1.1 10*3/uL (ref 0.7–3.1)
Lymphs: 14 %
MCH: 29.4 pg (ref 26.6–33.0)
MCHC: 32.7 g/dL (ref 31.5–35.7)
MCV: 90 fL (ref 79–97)
Monocytes Absolute: 0.5 10*3/uL (ref 0.1–0.9)
Monocytes: 6 %
Neutrophils Absolute: 6.4 10*3/uL (ref 1.4–7.0)
Neutrophils: 78 %
Platelets: 337 10*3/uL (ref 150–450)
RBC: 3.84 x10E6/uL (ref 3.77–5.28)
RDW: 14.5 % (ref 11.7–15.4)
WBC: 8.2 10*3/uL (ref 3.4–10.8)

## 2023-05-26 LAB — COMPREHENSIVE METABOLIC PANEL
ALT: 24 IU/L (ref 0–32)
AST: 38 IU/L (ref 0–40)
Albumin: 4.5 g/dL (ref 3.8–4.8)
Alkaline Phosphatase: 102 IU/L (ref 44–121)
BUN/Creatinine Ratio: 18 (ref 12–28)
BUN: 24 mg/dL (ref 8–27)
Bilirubin Total: 0.4 mg/dL (ref 0.0–1.2)
CO2: 20 mmol/L (ref 20–29)
Calcium: 10.4 mg/dL — ABNORMAL HIGH (ref 8.7–10.3)
Chloride: 99 mmol/L (ref 96–106)
Creatinine, Ser: 1.33 mg/dL — ABNORMAL HIGH (ref 0.57–1.00)
Globulin, Total: 2.8 g/dL (ref 1.5–4.5)
Glucose: 126 mg/dL — ABNORMAL HIGH (ref 70–99)
Potassium: 4.4 mmol/L (ref 3.5–5.2)
Sodium: 137 mmol/L (ref 134–144)
Total Protein: 7.3 g/dL (ref 6.0–8.5)
eGFR: 41 mL/min/{1.73_m2} — ABNORMAL LOW (ref >59)

## 2023-05-26 NOTE — Therapy (Signed)
OUTPATIENT PHYSICAL THERAPY LOWER EXTREMITY TREATMENT    Patient Name: Madison Clements MRN: [id number] DOB:07/26/1946, 77 y.o., female Today's Date: 05/26/2023  END OF SESSION:  PT End of Session - 05/26/23 1111     Visit Number 4    Number of Visits 16    Date for PT Re-Evaluation 07/07/23    Authorization Type Medicare    PT Start Time 1105    PT Stop Time 1145    PT Time Calculation (min) 40 min    Activity Tolerance Patient tolerated treatment well    Behavior During Therapy WFL for tasks assessed/performed              Past Medical History:  Diagnosis Date   Allergy 1990   Morphine following surgery   Arthritis    generalized   Atypical mole 02/24/2015   RGHT LATERAL THIGH MODERATE   Atypical mole 01/20/2022   Left Breast (mild)   Atypical mole 01/20/2022   Mid Back (mild)   Atypical nevi 02/24/2015   RIGHT NECK MILD   Atypical nevi 08/25/2015   LEFT UPPER ARM MILD   Atypical nevi 08/25/2015   LEFT LATERAL FOREARM MILD/FREE   Atypical nevi 02/23/2016   LEFT THIGH MODERATE/FREE   Atypical nevi 02/23/2016   LEFT MEDIAL LEG MODERATE/FREE   Atypical nevi 02/23/2016   LEFT UPPER BACK MILD /FREE   Atypical nevi 08/23/2016   RIGHT ANT PROX THIGH MILD /FREE   Atypical nevi 07/25/2018   MID LOWER BACK MODERATE   Atypical nevi 01/22/2019   LEFT MID BACK MILD /FREE   Atypical nevi 01/22/2019   LEFT NECK MILD   Atypical nevi 08/06/2019   LEFT INNER BREAST MODERATE W/S   Atypical nevi 08/06/2019   LEFT OUTER SIDE MILD/FREE   Blood transfusion without reported diagnosis    hx of   Breast cancer (HCC) 2022   CAD (coronary artery disease), native coronary artery 02/07/2019   Minimal CAD at the time of cath in 2016 in [facility]   Family history of breast cancer 08/19/2021   Family history of pancreatic cancer 08/19/2021   Fatty liver 05/21/2020   Gastric polyps 10/02/2018   EGD 03/2015; benign   GERD (gastroesophageal reflux disease)  10/02/2018   on meds   History of melanoma 10/02/2018   Abdomen 2012; 2013; s/p local excisions.    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 DISTAL LOWER LEG TREATED BY DR. Leda Quail   Sleep apnea    Sleep apnea 05/26/2020   uses CPAP   Tricuspid regurgitation    moderate by echo 04/2023   Past Surgical History:  Procedure Laterality Date   ABDOMINAL HYSTERECTOMY  1990   APPENDECTOMY  1962   BLADDER SUSPENSION  1990   BREAST LUMPECTOMY WITH RADIOACTIVE SEED LOCALIZATION Left 09/09/2021   Procedure: LEFT BREAST LUMPECTOMY WITH RADIOACTIVE SEED LOCALIZATION X2;  Surgeon: Manus Rudd, MD;  Location: [facility];  Service: General;  Laterality: Left;   COLONOSCOPY  2016   in [place]-hx of polyps  EYE SURGERY  2013 torn retina laser   MENISCUS REPAIR Right 2001   right knee   thumb surgery Left    TONSILLECTOMY AND ADENOIDECTOMY     tuabl ligation     TUBAL LIGATION  1975   WISDOM TOOTH EXTRACTION     Patient Active Problem List   Diagnosis Date Noted   Tricuspid regurgitation    Primary osteoarthritis of right shoulder 04/11/2023   Trochanteric bursitis of both hips 04/11/2023   Atrial tachycardia 02/09/2023   Osteopenia 09/19/2022   SVT (supraventricular tachycardia) 07/11/2022   Genetic testing 08/30/2021   Family history of breast cancer 08/19/2021   Family history of pancreatic cancer 08/19/2021   Malignant neoplasm of overlapping sites of left breast in female, estrogen receptor positive (HCC) 08/16/2021   Hepatic steatosis 07/28/2020   OSA (obstructive sleep apnea)  05/26/2020   Coronary artery disease involving native coronary artery of native heart without angina pectoris 02/07/2019   GERD (gastroesophageal reflux disease) 10/02/2018   Essential hypertension 10/02/2018   Mixed hyperlipidemia 10/02/2018   Osteoporosis of forearm 10/02/2018   Primary osteoarthritis involving multiple joints 10/02/2018   Pulmonary hypertension, primary (HCC) 10/02/2018   History of melanoma 10/02/2018   Polyp of colon, adenomatous 10/02/2018   Gastric polyps 10/02/2018    PCP: Asencion Partridge   REFERRING PROVIDER: Asencion Partridge   REFERRING DIAG: bil hip pain, low back pain, R knee pain   THERAPY DIAG:  Other low back pain  Bilateral hip pain  Chronic pain of right knee  Rationale for Evaluation and Treatment: Rehabilitation  ONSET DATE:    SUBJECTIVE:   SUBJECTIVE STATEMENT: Pt states some improvement in hip pain since injections earlier in the week. Back has been ok.   Eval: Pt states several areas of pain. She has pain and OA in R knee, and is scheduled for R TKA in  November. L knee non painful at this time. She also has pain in Low back: ongoing for about 6 months. Prednisone helped, also taking tylenol arthritis, up to 4/day and topical creams. States increased pain with Increased activity, walking and standing. Pain location is  Very low, center of back. Sitting more comfortable. Not currently doing any exercise for back. Hips are both painful L and R. Flare up from time to time. Has had injections in the past. But no PT.    PERTINENT HISTORY: Breast CA 2022, new heart valve issue. , CAD,  PAIN:  Are you having pain? Yes: NPRS scale: 6/10 Pain location: back  Pain description: sore , tight  Aggravating factors: standing, walking  Relieving factors: sitting, rest   Are you having pain? Yes: NPRS scale: 5/10 Pain location: Bil hips  Pain description: sore  Aggravating factors: standing, walking, activity  Relieving factors: rest   Are you  having pain? Yes: NPRS scale: 0-5 /10 Pain location: R knee  Pain description: sore  Aggravating factors: increased activity  Relieving factors: none stated   PRECAUTIONS: None  WEIGHT BEARING RESTRICTIONS: No  FALLS:  Has patient fallen in last 6 months? No 1 fall 1 year ago, at home.  No injury   PLOF: Independent  PATIENT GOALS:  Decreased pain   NEXT MD VISIT:   OBJECTIVE:   DIAGNOSTIC FINDINGS:   PATIENT SURVEYS:  Fotot- low back-  initial:  51.6  COGNITION: Overall cognitive status: Within functional limits for tasks assessed     SENSATION: WFL  EDEMA:   POSTURE:    No Significant postural limitations  PALPATION:  Tender to palpate bil hip- greater trochanters Tenderness in L4/5 region centrally, and into bil SI, top of sacrum, and into bil glutes    LOWER EXTREMITY ROM:  Hips: WFL Back: [facility]  Active /Passive ROM Left eval Right eval  Hip flexion    Hip extension    Hip abduction    Hip adduction    Hip internal rotation    Hip external rotation    Knee flexion wfl 110  Knee extension  -15  Ankle dorsiflexion    Ankle plantarflexion    Ankle inversion    Ankle eversion     (Blank rows = not tested)  LOWER EXTREMITY MMT:  MMT Left eval Right  eval  Hip flexion 4  4  Hip extension    Hip abduction 4 4  Hip adduction    Hip internal rotation    Hip external rotation    Knee flexion 5 4  Knee extension 5 4  Ankle dorsiflexion    Ankle plantarflexion    Ankle inversion    Ankle eversion     (Blank rows = not tested)   LOWER EXTREMITY SPECIAL TESTS:    GAIT: Distance walked: 100 ft  Assistive device utilized: None Level of assistance: Complete Independence Comments: varus position of R knee, mild increase in lateral displacement   TODAY'S TREATMENT:                                                                                                                              DATE:   05/26/23. Therapeutic  Exercise: Aerobic:  Bike L 1 x 6 min  Supine: SLR 1 x 10 bil;    quad sets x 10 on R; Clams GTB x 20;  Seated:   LAQ 2.5 lb x 15 bil;  Standing:  Hip abd 2 x 10 bil; March x 15 no UE support;   Stretches:    Hip ER fallouts x 10;  Neuromuscular Re-education: Manual Therapy: Self Care:   Therapeutic Exercise: Aerobic: Supine:  pelvic tilts x 20;  SLR 2 x 5 bil;   TA with education on optimal contraction, with breathing 2 x 10;  S/L: hip abd x10 bil    clams   x 10 bil;  Seated:  education on optimal seated position, neutral pelvis, and hip hinge motion.  Sit to stand 2 x 5;  Standing: Stretches:   SKTC 30 sec x 3 bil;   Hip ER fallouts x 10;  Neuromuscular Re-education: Manual Therapy: PROM for R knee flex and ext,  Therapeutic Activity: Self Care:    PATIENT EDUCATION:  Education details: updated and reviewed HEP, added quad sets  Person educated: Patient Education method: Explanation, Demonstration, Tactile cues, Verbal cues, and Handouts Education comprehension: verbalized understanding, returned demonstration, verbal cues required, tactile cues required, and needs further education   HOME EXERCISE PROGRAM: Access Code: [email or web address]   ASSESSMENT:  CLINICAL IMPRESSION: 05/26/2023 Pt with good  ability for progressive ther ex today. She has improved pain today since injections earlier in week. Will continue to benefit from strengthening for hips and mobility/strength for knee.   Eval: Patient presents with primary complaint of  pain in low back. She has pain in low lumbar and sacral region. She also has  pain in bil hips, consistent with bursitis. R knee has significant OA and she is scheduled for replacement in November. She has ROM loss for flexion and extension in knee, as well as mild gait deficits. Hip pain likely influenced by knee dysfunction and gait deficits. Pt with pain in multiple areas, and has lack of effective HEP for management. Pt with decreased ability for full  functional activities. Pt will  benefit from skilled PT to improve deficits and pain and to return to PLOF.   OBJECTIVE IMPAIRMENTS: Abnormal gait, decreased activity tolerance, decreased mobility, decreased ROM, decreased strength, increased muscle spasms, improper body mechanics, and pain.   ACTIVITY LIMITATIONS: lifting, bending, sitting, standing, squatting, stairs, transfers, hygiene/grooming, and locomotion level  PARTICIPATION LIMITATIONS: meal prep, cleaning, laundry, shopping, and community activity  PERSONAL FACTORS: Time since onset of injury/illness/exacerbation and 1 comorbidity: multiple pain locations  are also affecting patient's functional outcome.   REHAB POTENTIAL: Good  CLINICAL DECISION MAKING: Stable/uncomplicated  EVALUATION COMPLEXITY: Low   GOALS: Goals reviewed with patient? Yes   SHORT TERM GOALS: Target date: 05/26/2023   Pt to be independent with initial HEP  Goal status: INITIAL   LONG TERM GOALS: Target date: 07/07/2023  Pt to be independent with final HEP  Goal status: INITIAL  2.  Pt to report improved hip strength to 0-2/10 with activity   Goal status: INITIAL  3.  Pt to report decreased pain in low back to 0-3/10 with standing activity .   Goal status: INITIAL  4.  Pt to demo R knee being at max capacity for ROM and strength, to ensure best pre-op function.   Goal status: INITIAL     PLAN:  PT FREQUENCY: 1-2x/week  PT DURATION: 8 weeks  PLANNED INTERVENTIONS: Therapeutic exercises, Therapeutic activity, Neuromuscular re-education, Patient/Family education, Self Care, Joint mobilization, Joint manipulation, Stair training, Orthotic/Fit training, DME instructions, Aquatic Therapy, Dry Needling, Electrical stimulation, Cryotherapy, Moist heat, Taping, Ultrasound, Ionotophoresis 4mg /ml Dexamethasone, Manual therapy,  Vasopneumatic device, Traction, Spinal manipulation, Spinal mobilization,Balance training, Gait training,   PLAN  FOR NEXT SESSION:    Sedalia Muta, PT, DPT 12:05 PM  05/26/23

## 2023-05-29 ENCOUNTER — Encounter: Payer: Self-pay | Admitting: Physical Therapy

## 2023-05-29 ENCOUNTER — Ambulatory Visit (INDEPENDENT_AMBULATORY_CARE_PROVIDER_SITE_OTHER): Payer: Medicare Other | Admitting: Physical Therapy

## 2023-05-29 DIAGNOSIS — G8929 Other chronic pain: Secondary | ICD-10-CM

## 2023-05-29 DIAGNOSIS — M25551 Pain in right hip: Secondary | ICD-10-CM

## 2023-05-29 DIAGNOSIS — M25561 Pain in right knee: Secondary | ICD-10-CM

## 2023-05-29 DIAGNOSIS — M25552 Pain in left hip: Secondary | ICD-10-CM

## 2023-05-29 DIAGNOSIS — M5459 Other low back pain: Secondary | ICD-10-CM | POA: Diagnosis not present

## 2023-05-29 NOTE — Therapy (Signed)
OUTPATIENT PHYSICAL THERAPY LOWER EXTREMITY TREATMENT    Patient Name: Madison Clements MRN: [id number] DOB:May 31, 1946, 77 y.o., female Today's Date: 05/29/2023  END OF SESSION:  PT End of Session - 05/29/23 0939     Visit Number 5    Number of Visits 16    Date for PT Re-Evaluation 07/07/23    Authorization Type Medicare    PT Start Time 0935    PT Stop Time 1013    PT Time Calculation (min) 38 min    Activity Tolerance Patient tolerated treatment well    Behavior During Therapy WFL for tasks assessed/performed               Past Medical History:  Diagnosis Date   Allergy 1990   Morphine following surgery   Arthritis    generalized   Atypical mole 02/24/2015   RGHT LATERAL THIGH MODERATE   Atypical mole 01/20/2022   Left Breast (mild)   Atypical mole 01/20/2022   Mid Back (mild)   Atypical nevi 02/24/2015   RIGHT NECK MILD   Atypical nevi 08/25/2015   LEFT UPPER ARM MILD   Atypical nevi 08/25/2015   LEFT LATERAL FOREARM MILD/FREE   Atypical nevi 02/23/2016   LEFT THIGH MODERATE/FREE   Atypical nevi 02/23/2016   LEFT MEDIAL LEG MODERATE/FREE   Atypical nevi 02/23/2016   LEFT UPPER BACK MILD /FREE   Atypical nevi 08/23/2016   RIGHT ANT PROX THIGH MILD /FREE   Atypical nevi 07/25/2018   MID LOWER BACK MODERATE   Atypical nevi 01/22/2019   LEFT MID BACK MILD /FREE   Atypical nevi 01/22/2019   LEFT NECK MILD   Atypical nevi 08/06/2019   LEFT INNER BREAST MODERATE W/S   Atypical nevi 08/06/2019   LEFT OUTER SIDE MILD/FREE   Blood transfusion without reported diagnosis    hx of   Breast cancer (HCC) 2022   CAD (coronary artery disease), native coronary artery 02/07/2019   Minimal CAD at the time of cath in 2016 in [facility]   Family history of breast cancer 08/19/2021   Family history of pancreatic cancer 08/19/2021   Fatty liver 05/21/2020   Gastric polyps 10/02/2018   EGD 03/2015; benign   GERD (gastroesophageal reflux disease)  10/02/2018   on meds   History of melanoma 10/02/2018   Abdomen 2012; 2013; s/p local excisions.    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 DISTAL LOWER LEG TREATED BY DR. Leda Quail   Sleep apnea    Sleep apnea 05/26/2020   uses CPAP   Tricuspid regurgitation    moderate by echo 04/2023   Past Surgical History:  Procedure Laterality Date   ABDOMINAL HYSTERECTOMY  1990   APPENDECTOMY  1962   BLADDER SUSPENSION  1990   BREAST LUMPECTOMY WITH RADIOACTIVE SEED LOCALIZATION Left 09/09/2021   Procedure: LEFT BREAST LUMPECTOMY WITH RADIOACTIVE SEED LOCALIZATION X2;  Surgeon: Manus Rudd, MD;  Location: [facility];  Service: General;  Laterality: Left;   COLONOSCOPY  2016   in [place]-hx of polyps  EYE SURGERY  2013 torn retina laser   MENISCUS REPAIR Right 2001   right knee   thumb surgery Left    TONSILLECTOMY AND ADENOIDECTOMY     tuabl ligation     TUBAL LIGATION  1975   WISDOM TOOTH EXTRACTION     Patient Active Problem List   Diagnosis Date Noted   Tricuspid regurgitation    Primary osteoarthritis of right shoulder 04/11/2023   Trochanteric bursitis of both hips 04/11/2023   Atrial tachycardia 02/09/2023   Osteopenia 09/19/2022   SVT (supraventricular tachycardia) 07/11/2022   Genetic testing 08/30/2021   Family history of breast cancer 08/19/2021   Family history of pancreatic cancer 08/19/2021   Malignant neoplasm of overlapping sites of left breast in female, estrogen receptor positive (HCC) 08/16/2021   Hepatic steatosis 07/28/2020   OSA (obstructive sleep apnea)  05/26/2020   Coronary artery disease involving native coronary artery of native heart without angina pectoris 02/07/2019   GERD (gastroesophageal reflux disease) 10/02/2018   Essential hypertension 10/02/2018   Mixed hyperlipidemia 10/02/2018   Osteoporosis of forearm 10/02/2018   Primary osteoarthritis involving multiple joints 10/02/2018   Pulmonary hypertension, primary (HCC) 10/02/2018   History of melanoma 10/02/2018   Polyp of colon, adenomatous 10/02/2018   Gastric polyps 10/02/2018    PCP: Asencion Partridge   REFERRING PROVIDER: Asencion Partridge   REFERRING DIAG: bil hip pain, low back pain, R knee pain   THERAPY DIAG:  Other low back pain  Bilateral hip pain  Chronic pain of right knee  Rationale for Evaluation and Treatment: Rehabilitation  ONSET DATE:    SUBJECTIVE:   SUBJECTIVE STATEMENT:  Pt states very minimal pain in hips since injections. Thinks back is doing better also.   Eval: Pt states several areas of pain. She has pain and OA in R knee, and is scheduled for R TKA in  November. L knee non painful at this time. She also has pain in Low back: ongoing for about 6 months. Prednisone helped, also taking tylenol arthritis, up to 4/day and topical creams. States increased pain with Increased activity, walking and standing. Pain location is  Very low, center of back. Sitting more comfortable. Not currently doing any exercise for back. Hips are both painful L and R. Flare up from time to time. Has had injections in the past. But no PT.    PERTINENT HISTORY: Breast CA 2022, new heart valve issue. , CAD,  PAIN:  Are you having pain? Yes: NPRS scale: 0-2/10 Pain location: back  Pain description: sore , tight  Aggravating factors: standing, walking  Relieving factors: sitting, rest   Are you having pain? Yes: NPRS scale: 3-4/10 Pain location: Bil hips  Pain description: sore  Aggravating factors: standing, walking, activity  Relieving factors: rest   Are you  having pain? Yes: NPRS scale: 0-5 /10 Pain location: R knee  Pain description: sore  Aggravating factors: increased activity  Relieving factors: none stated   PRECAUTIONS: None  WEIGHT BEARING RESTRICTIONS: No  FALLS:  Has patient fallen in last 6 months? No 1 fall 1 year ago, at home.  No injury   PLOF: Independent  PATIENT GOALS:  Decreased pain   NEXT MD VISIT:   OBJECTIVE:   DIAGNOSTIC FINDINGS:   PATIENT SURVEYS:  Fotot- low back-  initial:  51.6  COGNITION: Overall cognitive status: Within functional limits for tasks assessed     SENSATION: WFL  EDEMA:   POSTURE:    No Significant postural limitations  PALPATION:  Tender to palpate bil hip- greater trochanters Tenderness in L4/5 region centrally, and into bil SI, top of sacrum, and into bil glutes    LOWER EXTREMITY ROM:  Hips: WFL Back: [facility]  Active /Passive ROM Left eval Right eval  Hip flexion    Hip extension    Hip abduction    Hip adduction    Hip internal rotation    Hip external rotation    Knee flexion wfl 110  Knee extension  -15  Ankle dorsiflexion    Ankle plantarflexion    Ankle inversion    Ankle eversion     (Blank rows = not tested)  LOWER EXTREMITY MMT:  MMT Left eval Right  eval  Hip flexion 4  4  Hip extension    Hip abduction 4 4  Hip adduction    Hip internal rotation    Hip external rotation    Knee flexion 5 4  Knee extension 5 4  Ankle dorsiflexion    Ankle plantarflexion    Ankle inversion    Ankle eversion     (Blank rows = not tested)   LOWER EXTREMITY SPECIAL TESTS:    GAIT: Distance walked: 100 ft  Assistive device utilized: None Level of assistance: Complete Independence Comments: varus position of R knee, mild increase in lateral displacement   TODAY'S TREATMENT:                                                                                                                              DATE:   05/29/23. Therapeutic  Exercise: Aerobic:  Bike L 1 x 7 min  Supine:  SLR 1 x 10 bil;   Clams GTB x 20;  S/L:  hip 1 x 10 bil;  Seated:   Standing:  Hip abd 2 x 10 bil;  March x 15 no UE support;   Stretches:    Hip ER fallouts x 10;  seated HSS 30 sec x 3 bil;  hip flexor off side of bed 20 sec x 3 bil; supine piriformis 30 sec x 3 bil;  Neuromuscular Re-education: Manual Therapy: long leg distraction on L;   Self Care:   Therapeutic Exercise: Aerobic:  Bike L 1 x 6 min  Supine: SLR 1 x 10 bil;    quad sets x 10 on R; Clams GTB x 20;  Seated:   LAQ 2.5 lb x 15 bil;  Standing:  Hip abd 2 x 10 bil; March x 15 no UE support;   Stretches:    Hip ER fallouts x 10;  Neuromuscular Re-education: Manual Therapy: Self Care:   Therapeutic Exercise: Aerobic: Supine:  pelvic tilts x 20;  SLR 2 x 5 bil;   TA with education on optimal contraction, with breathing 2 x 10;  S/L: hip abd x10 bil    clams   x 10 bil;  Seated:  education on optimal seated position, neutral pelvis, and hip hinge motion.  Sit to stand 2 x 5;  Standing: Stretches:   SKTC 30 sec x 3 bil;   Hip ER fallouts x 10;  Neuromuscular Re-education: Manual Therapy: PROM for R knee flex and ext,  Therapeutic Activity: Self Care:    PATIENT EDUCATION:  Education details: updated and reviewed HEP,  Person educated: Patient Education method: Explanation, Demonstration, Tactile cues, Verbal cues, and Handouts Education comprehension: verbalized understanding, returned demonstration, verbal cues required, tactile cues required, and needs further education   HOME EXERCISE PROGRAM: Access Code: [email or web address]   ASSESSMENT:  CLINICAL IMPRESSION: 05/29/2023 Pt with improving pain. She has good tolerance for ther ex, but does have noted weakness in hips that will require continued work. She has noted hip weakness and decreased stability in standing position with march and step ups.    Eval: Patient presents with primary complaint of  pain in low back.  She has pain in low lumbar and sacral region. She also has  pain in bil hips, consistent with bursitis. R knee has significant OA and she is scheduled for replacement in November. She has ROM loss for flexion and extension in knee, as well as mild gait deficits. Hip pain likely influenced by knee dysfunction and gait deficits. Pt with pain in multiple areas, and has lack of effective HEP for management. Pt with decreased ability for full functional activities. Pt will  benefit from skilled PT to improve deficits and pain and to return to PLOF.   OBJECTIVE IMPAIRMENTS: Abnormal gait, decreased activity tolerance, decreased mobility, decreased ROM, decreased strength, increased muscle spasms, improper body mechanics, and pain.   ACTIVITY LIMITATIONS: lifting, bending, sitting, standing, squatting, stairs, transfers, hygiene/grooming, and locomotion level  PARTICIPATION LIMITATIONS: meal prep, cleaning, laundry, shopping, and community activity  PERSONAL FACTORS: Time since onset of injury/illness/exacerbation and 1 comorbidity: multiple pain locations  are also affecting patient's functional outcome.   REHAB POTENTIAL: Good  CLINICAL DECISION MAKING: Stable/uncomplicated  EVALUATION COMPLEXITY: Low   GOALS: Goals reviewed with patient? Yes   SHORT TERM GOALS: Target date: 05/26/2023   Pt to be independent with initial HEP  Goal status: INITIAL   LONG TERM GOALS: Target date: 07/07/2023  Pt to be independent with final HEP  Goal status: INITIAL  2.  Pt to report improved hip strength to 0-2/10 with activity   Goal status: INITIAL  3.  Pt to report decreased pain in low back to 0-3/10 with standing activity .   Goal status: INITIAL  4.  Pt to demo R knee being at max capacity for ROM and strength, to ensure best pre-op function.   Goal status: INITIAL    PLAN:  PT FREQUENCY: 1-2x/week  PT DURATION: 8 weeks  PLANNED INTERVENTIONS: Therapeutic exercises, Therapeutic  activity, Neuromuscular re-education, Patient/Family education, Self Care, Joint mobilization, Joint manipulation, Stair training, Orthotic/Fit training, DME instructions, Aquatic Therapy, Dry Needling, Electrical stimulation, Cryotherapy, Moist heat, Taping, Ultrasound, Ionotophoresis 4mg /ml Dexamethasone, Manual therapy,  Vasopneumatic device, Traction, Spinal manipulation, Spinal mobilization,Balance training, Gait training,   PLAN FOR NEXT SESSION:  try bridging, continue hip mobility and strength.    Sedalia Muta, PT, DPT 10:15 AM  05/29/23

## 2023-05-31 ENCOUNTER — Encounter: Payer: Medicare Other | Admitting: Physical Therapy

## 2023-06-01 ENCOUNTER — Ambulatory Visit (INDEPENDENT_AMBULATORY_CARE_PROVIDER_SITE_OTHER): Payer: Medicare Other | Admitting: Physical Therapy

## 2023-06-01 ENCOUNTER — Telehealth: Payer: Self-pay | Admitting: Cardiology

## 2023-06-01 ENCOUNTER — Encounter: Payer: Self-pay | Admitting: Physical Therapy

## 2023-06-01 DIAGNOSIS — M25551 Pain in right hip: Secondary | ICD-10-CM | POA: Diagnosis not present

## 2023-06-01 DIAGNOSIS — M25561 Pain in right knee: Secondary | ICD-10-CM

## 2023-06-01 DIAGNOSIS — M5459 Other low back pain: Secondary | ICD-10-CM

## 2023-06-01 DIAGNOSIS — M25552 Pain in left hip: Secondary | ICD-10-CM | POA: Diagnosis not present

## 2023-06-01 DIAGNOSIS — Z01812 Encounter for preprocedural laboratory examination: Secondary | ICD-10-CM

## 2023-06-01 DIAGNOSIS — G8929 Other chronic pain: Secondary | ICD-10-CM

## 2023-06-01 NOTE — Telephone Encounter (Signed)
Left message for patient to callback.  Need to schedule lab appointment for repeat BMET per Dr. Mayford Knife for upcoming heart cath.  BMET order placed, just need to schedule lab appt.

## 2023-06-01 NOTE — Therapy (Signed)
OUTPATIENT PHYSICAL THERAPY LOWER EXTREMITY TREATMENT    Patient Name: Madison Clements MRN: [id number] DOB:1946/07/18, 78 y.o., female Today's Date: 06/01/2023  END OF SESSION:  PT End of Session - 06/01/23 0846     Visit Number 6    Number of Visits 16    Date for PT Re-Evaluation 07/07/23    Authorization Type Medicare    PT Start Time 0847    PT Stop Time 0930    PT Time Calculation (min) 43 min    Activity Tolerance Patient tolerated treatment well    Behavior During Therapy [facility] for tasks assessed/performed               Past Medical History:  Diagnosis Date   Allergy 1990   Morphine following surgery   Arthritis    generalized   Atypical mole 02/24/2015   RGHT LATERAL THIGH MODERATE   Atypical mole 01/20/2022   Left Breast (mild)   Atypical mole 01/20/2022   Mid Back (mild)   Atypical nevi 02/24/2015   RIGHT NECK MILD   Atypical nevi 08/25/2015   LEFT UPPER ARM MILD   Atypical nevi 08/25/2015   LEFT LATERAL FOREARM MILD/FREE   Atypical nevi 02/23/2016   LEFT THIGH MODERATE/FREE   Atypical nevi 02/23/2016   LEFT MEDIAL LEG MODERATE/FREE   Atypical nevi 02/23/2016   LEFT UPPER BACK MILD /FREE   Atypical nevi 08/23/2016   RIGHT ANT PROX THIGH MILD /FREE   Atypical nevi 07/25/2018   MID LOWER BACK MODERATE   Atypical nevi 01/22/2019   LEFT MID BACK MILD /FREE   Atypical nevi 01/22/2019   LEFT NECK MILD   Atypical nevi 08/06/2019   LEFT INNER BREAST MODERATE W/S   Atypical nevi 08/06/2019   LEFT OUTER SIDE MILD/FREE   Blood transfusion without reported diagnosis    hx of   Breast cancer (HCC) 2022   CAD (coronary artery disease), native coronary artery 02/07/2019   Minimal CAD at the time of cath in 2016 in [facility]   Family history of breast cancer 08/19/2021   Family history of pancreatic cancer 08/19/2021   Fatty liver 05/21/2020   Gastric polyps 10/02/2018   EGD 03/2015; benign   GERD (gastroesophageal reflux disease)  10/02/2018   on meds   History of melanoma 10/02/2018   Abdomen 2012; 2013; s/p local excisions.    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 DISTAL LOWER LEG TREATED BY DR. Leda Quail   Sleep apnea    Sleep apnea 05/26/2020   uses CPAP   Tricuspid regurgitation    moderate by echo 04/2023   Past Surgical History:  Procedure Laterality Date   ABDOMINAL HYSTERECTOMY  1990   APPENDECTOMY  1962   BLADDER SUSPENSION  1990   BREAST LUMPECTOMY WITH RADIOACTIVE SEED LOCALIZATION Left 09/09/2021   Procedure: LEFT BREAST LUMPECTOMY WITH RADIOACTIVE SEED LOCALIZATION X2;  Surgeon: Manus Rudd, MD;  Location: [facility];  Service: General;  Laterality: Left;   COLONOSCOPY  2016   in -hx of polyps  EYE SURGERY  2013 torn retina laser   MENISCUS REPAIR Right 2001   right knee   thumb surgery Left    TONSILLECTOMY AND ADENOIDECTOMY     tuabl ligation     TUBAL LIGATION  1975   WISDOM TOOTH EXTRACTION     Patient Active Problem List   Diagnosis Date Noted   Tricuspid regurgitation    Primary osteoarthritis of right shoulder 04/11/2023   Trochanteric bursitis of both hips 04/11/2023   Atrial tachycardia 02/09/2023   Osteopenia 09/19/2022   SVT (supraventricular tachycardia) 07/11/2022   Genetic testing 08/30/2021   Family history of breast cancer 08/19/2021   Family history of pancreatic cancer 08/19/2021   Malignant neoplasm of overlapping sites of left breast in female, estrogen receptor positive (HCC) 08/16/2021   Hepatic steatosis 07/28/2020   OSA (obstructive sleep apnea)  05/26/2020   Coronary artery disease involving native coronary artery of native heart without angina pectoris 02/07/2019   GERD (gastroesophageal reflux disease) 10/02/2018   Essential hypertension 10/02/2018   Mixed hyperlipidemia 10/02/2018   Osteoporosis of forearm 10/02/2018   Primary osteoarthritis involving multiple joints 10/02/2018   Pulmonary hypertension, primary (HCC) 10/02/2018   History of melanoma 10/02/2018   Polyp of colon, adenomatous 10/02/2018   Gastric polyps 10/02/2018    PCP: Asencion Partridge   REFERRING PROVIDER: Asencion Partridge   REFERRING DIAG: bil hip pain, low back pain, R knee pain   THERAPY DIAG:  Other low back pain  Bilateral hip pain  Chronic pain of right knee  Rationale for Evaluation and Treatment: Rehabilitation  ONSET DATE:    SUBJECTIVE:   SUBJECTIVE STATEMENT:  Pt states very minimal pain in hips since injections. Thinks back is doing better also.   Eval: Pt states several areas of pain. She has pain and OA in R knee, and is scheduled for R TKA in  November. L knee non painful at this time. She also has pain in Low back: ongoing for about 6 months. Prednisone helped, also taking tylenol arthritis, up to 4/day and topical creams. States increased pain with Increased activity, walking and standing. Pain location is  Very low, center of back. Sitting more comfortable. Not currently doing any exercise for back. Hips are both painful L and R. Flare up from time to time. Has had injections in the past. But no PT.    PERTINENT HISTORY: Breast CA 2022, new heart valve issue. , CAD,  PAIN:  Are you having pain? Yes: NPRS scale: 0-2/10 Pain location: back  Pain description: sore , tight  Aggravating factors: standing, walking  Relieving factors: sitting, rest   Are you having pain? Yes: NPRS scale: 3-4/10 Pain location: Bil hips  Pain description: sore  Aggravating factors: standing, walking, activity  Relieving factors: rest   Are you  having pain? Yes: NPRS scale: 0-5 /10 Pain location: R knee  Pain description: sore  Aggravating factors: increased activity  Relieving factors: none stated   PRECAUTIONS: None  WEIGHT BEARING RESTRICTIONS: No  FALLS:  Has patient fallen in last 6 months? No 1 fall 1 year ago, at home.  No injury   PLOF: Independent  PATIENT GOALS:  Decreased pain   NEXT MD VISIT:   OBJECTIVE:   DIAGNOSTIC FINDINGS:   PATIENT SURVEYS:   Fotot- low back-  initial:  51.6 Visit  6:  57.3   COGNITION: Overall cognitive status: Within functional limits for tasks assessed     SENSATION: WFL  EDEMA:   POSTURE:  No Significant postural limitations  PALPATION: Tender to palpate bil hip- greater trochanters Tenderness in L4/5 region centrally, and into bil SI, top of sacrum, and into bil glutes    LOWER EXTREMITY ROM:  Hips: WFL Back: [facility]  Active /Passive ROM Left eval Right eval  Hip flexion    Hip extension    Hip abduction    Hip adduction    Hip internal rotation    Hip external rotation    Knee flexion wfl 110  Knee extension  -15  Ankle dorsiflexion    Ankle plantarflexion    Ankle inversion    Ankle eversion     (Blank rows = not tested)  LOWER EXTREMITY MMT:  MMT Left eval Right  eval  Hip flexion 4  4  Hip extension    Hip abduction 4 4  Hip adduction    Hip internal rotation    Hip external rotation    Knee flexion 5 4  Knee extension 5 4  Ankle dorsiflexion    Ankle plantarflexion    Ankle inversion    Ankle eversion     (Blank rows = not tested)   LOWER EXTREMITY SPECIAL TESTS:    GAIT: Distance walked: 100 ft  Assistive device utilized: None Level of assistance: Complete Independence Comments: varus position of R knee, mild increase in lateral displacement   TODAY'S TREATMENT:                                                                                                                              DATE:    06/01/23. Therapeutic Exercise: Aerobic:  Bike L 1 x 7 min  Supine:  SLR 1 x 10 bil;   Clams GTB x 20;  Seated:  LAQ 3 lb x 20 on L;   Standing:  Hip abd 2 x 5  bil;  March x 15 no UE support;  step ups 6 in 2 x 5 bil; stairs up/down 5 steps with 1 hand rail x 5;  Reviewed step over step for going up, and step to going down , for improved ease on R knee.  Stretches:    Hip ER fallouts x 10;   Neuromuscular Re-education: Manual Therapy:  PROM and joint mobs for knee flex and ext on R;  Self Care:   Therapeutic Exercise: Aerobic:  Bike L 1 x 6 min  Supine: SLR 1 x 10 bil;    quad sets x 10 on R; Clams GTB x 20;  Seated:   LAQ 2.5 lb x 15 bil;  Standing:  Hip abd 2 x 10 bil; March x 15 no UE support;   Stretches:    Hip ER fallouts x 10;  Neuromuscular Re-education: Manual Therapy: Self Care:   Therapeutic Exercise: Aerobic: Supine:  pelvic tilts x 20;  SLR 2 x 5 bil;   TA with education on optimal contraction, with breathing 2 x 10;  S/L: hip abd x10 bil  clams   x 10 bil;  Seated:  education on optimal seated position, neutral pelvis, and hip hinge motion.  Sit to stand 2 x 5;  Standing: Stretches:   SKTC 30 sec x 3 bil;   Hip ER fallouts x 10;  Neuromuscular Re-education: Manual Therapy: PROM for R knee flex and ext,  Therapeutic Activity: Self Care:    PATIENT EDUCATION:  Education details: updated and reviewed HEP,  Person educated: Patient Education method: Explanation, Demonstration, Tactile cues, Verbal cues, and Handouts Education comprehension: verbalized understanding, returned demonstration, verbal cues required, tactile cues required, and needs further education   HOME EXERCISE PROGRAM: Access Code: [email or web address]   ASSESSMENT:  CLINICAL IMPRESSION: 06/01/2023 Pt with improving pain. She has good tolerance for ther ex, but does have noted weakness in hips that will require continued work. She has slight soreness in bil hips, in SLS phase with standing  ther ex today. Reviewed safety and mechanics for stair climbing today. Pt with improved ability with step -to pattern when going down for less pressure on R knee.     Eval: Patient presents with primary complaint of  pain in low back. She has pain in low lumbar and sacral region. She also has  pain in bil hips, consistent with bursitis. R knee has significant OA and she is scheduled for replacement in November. She has ROM loss for flexion and extension in knee, as well as mild gait deficits. Hip pain likely influenced by knee dysfunction and gait deficits. Pt with pain in multiple areas, and has lack of effective HEP for management. Pt with decreased ability for full functional activities. Pt will  benefit from skilled PT to improve deficits and pain and to return to PLOF.   OBJECTIVE IMPAIRMENTS: Abnormal gait, decreased activity tolerance, decreased mobility, decreased ROM, decreased strength, increased muscle spasms, improper body mechanics, and pain.   ACTIVITY LIMITATIONS: lifting, bending, sitting, standing, squatting, stairs, transfers, hygiene/grooming, and locomotion level  PARTICIPATION LIMITATIONS: meal prep, cleaning, laundry, shopping, and community activity  PERSONAL FACTORS: Time since onset of injury/illness/exacerbation and 1 comorbidity: multiple pain locations  are also affecting patient's functional outcome.   REHAB POTENTIAL: Good  CLINICAL DECISION MAKING: Stable/uncomplicated  EVALUATION COMPLEXITY: Low   GOALS: Goals reviewed with patient? Yes   SHORT TERM GOALS: Target date: 05/26/2023   Pt to be independent with initial HEP  Goal status: MET   LONG TERM GOALS: Target date: 07/07/2023  Pt to be independent with final HEP  Goal status: INITIAL  2.  Pt to report improved hip strength to 0-2/10 with activity   Goal status: INITIAL  3.  Pt to report decreased pain in low back to 0-3/10 with standing activity .   Goal status: INITIAL  4.  Pt to demo R  knee being at max capacity for ROM and strength, to ensure best pre-op function.   Goal status: INITIAL    PLAN:  PT FREQUENCY: 1-2x/week  PT DURATION: 8 weeks  PLANNED INTERVENTIONS: Therapeutic exercises, Therapeutic activity, Neuromuscular re-education, Patient/Family education, Self Care, Joint mobilization, Joint manipulation, Stair training, Orthotic/Fit training, DME instructions, Aquatic Therapy, Dry Needling, Electrical stimulation, Cryotherapy, Moist heat, Taping, Ultrasound, Ionotophoresis 4mg /ml Dexamethasone, Manual therapy,  Vasopneumatic device, Traction, Spinal manipulation, Spinal mobilization,Balance training, Gait training,   PLAN FOR NEXT SESSION:  try bridging, continue hip mobility and strength.    Sedalia Muta, PT, DPT 8:46 AM  06/01/23

## 2023-06-01 NOTE — Telephone Encounter (Signed)
-----   Message from Armanda Magic sent at 05/30/2023  4:45 PM EDT ----- Since she will be getting a cath soon I need to make sure her kidney function is stable for cath - have her come in on Friday for repeat BMET ----- Message ----- From: Franchot Gallo Sent: 05/30/2023   4:41 PM EDT To: Quintella Reichert, MD  The patient has been notified of the result and verbalized understanding.  All questions (if any) were answered. Franchot Gallo, RN 05/30/2023 4:38 PM     Patient states she has already stopped taking Celebrex (stopped 3-4 days ago) as advised by oncologist Dr. Mosetta Putt for a stool sample collection. Patient states she has not taken any extra doses of Lasix. She also reports Dr. Mosetta Putt will be rechecking her labs including CBC in early October. Labs faxed to Dr. Mosetta Putt at patient's request. Will forward to Dr. Mayford Knife to review and advise.

## 2023-06-02 ENCOUNTER — Ambulatory Visit: Payer: Medicare Other

## 2023-06-02 DIAGNOSIS — Z01812 Encounter for preprocedural laboratory examination: Secondary | ICD-10-CM | POA: Diagnosis not present

## 2023-06-03 LAB — BASIC METABOLIC PANEL
BUN/Creatinine Ratio: 22 (ref 12–28)
BUN: 30 mg/dL — ABNORMAL HIGH (ref 8–27)
CO2: 19 mmol/L — ABNORMAL LOW (ref 20–29)
Calcium: 9.9 mg/dL (ref 8.7–10.3)
Chloride: 97 mmol/L (ref 96–106)
Creatinine, Ser: 1.39 mg/dL — ABNORMAL HIGH (ref 0.57–1.00)
Glucose: 100 mg/dL — ABNORMAL HIGH (ref 70–99)
Potassium: 4.3 mmol/L (ref 3.5–5.2)
Sodium: 136 mmol/L (ref 134–144)
eGFR: 39 mL/min/{1.73_m2} — ABNORMAL LOW (ref >59)

## 2023-06-05 ENCOUNTER — Telehealth: Payer: Self-pay | Admitting: *Deleted

## 2023-06-05 DIAGNOSIS — R7989 Other specified abnormal findings of blood chemistry: Secondary | ICD-10-CM

## 2023-06-05 MED ORDER — FUROSEMIDE 40 MG PO TABS: 20.0000 mg | ORAL_TABLET | Freq: Every day | ORAL | Status: DC

## 2023-06-05 NOTE — Telephone Encounter (Signed)
Patient notified

## 2023-06-05 NOTE — Telephone Encounter (Signed)
-----   Message from Armanda Magic sent at 06/04/2023  9:12 PM EDT ----- SCr even higher now - decrease Lasix to 20mg  daily and repeat BMET on Friday

## 2023-06-06 ENCOUNTER — Encounter (HOSPITAL_COMMUNITY): Admission: RE | Admit: 2023-06-06 | Payer: Medicare Other | Source: Ambulatory Visit

## 2023-06-06 DIAGNOSIS — Z853 Personal history of malignant neoplasm of breast: Secondary | ICD-10-CM | POA: Diagnosis not present

## 2023-06-06 DIAGNOSIS — D649 Anemia, unspecified: Secondary | ICD-10-CM | POA: Diagnosis not present

## 2023-06-06 DIAGNOSIS — E538 Deficiency of other specified B group vitamins: Secondary | ICD-10-CM | POA: Diagnosis not present

## 2023-06-06 LAB — OCCULT BLOOD X 1 CARD TO LAB, STOOL
Fecal Occult Bld: NEGATIVE
Fecal Occult Bld: NEGATIVE
Fecal Occult Bld: NEGATIVE

## 2023-06-07 ENCOUNTER — Ambulatory Visit (INDEPENDENT_AMBULATORY_CARE_PROVIDER_SITE_OTHER): Payer: Medicare Other | Admitting: Physical Therapy

## 2023-06-07 ENCOUNTER — Encounter: Payer: Self-pay | Admitting: Physical Therapy

## 2023-06-07 DIAGNOSIS — G8929 Other chronic pain: Secondary | ICD-10-CM

## 2023-06-07 DIAGNOSIS — M5459 Other low back pain: Secondary | ICD-10-CM | POA: Diagnosis not present

## 2023-06-07 DIAGNOSIS — M25561 Pain in right knee: Secondary | ICD-10-CM

## 2023-06-07 DIAGNOSIS — M25551 Pain in right hip: Secondary | ICD-10-CM

## 2023-06-07 DIAGNOSIS — M25552 Pain in left hip: Secondary | ICD-10-CM

## 2023-06-07 NOTE — Therapy (Signed)
OUTPATIENT PHYSICAL THERAPY LOWER EXTREMITY TREATMENT    Patient Name: Isai Atanacio MRN: [id number] DOB:1945/10/29, 77 y.o., female Today's Date: 06/07/2023  END OF SESSION:  PT End of Session - 06/07/23 1035     Visit Number 7    Number of Visits 16    Date for PT Re-Evaluation 07/07/23    Authorization Type Medicare    PT Start Time 1020    PT Stop Time 1058    PT Time Calculation (min) 38 min    Activity Tolerance Patient tolerated treatment well    Behavior During Therapy WFL for tasks assessed/performed                Past Medical History:  Diagnosis Date   Allergy 1990   Morphine following surgery   Arthritis    generalized   Atypical mole 02/24/2015   RGHT LATERAL THIGH MODERATE   Atypical mole 01/20/2022   Left Breast (mild)   Atypical mole 01/20/2022   Mid Back (mild)   Atypical nevi 02/24/2015   RIGHT NECK MILD   Atypical nevi 08/25/2015   LEFT UPPER ARM MILD   Atypical nevi 08/25/2015   LEFT LATERAL FOREARM MILD/FREE   Atypical nevi 02/23/2016   LEFT THIGH MODERATE/FREE   Atypical nevi 02/23/2016   LEFT MEDIAL LEG MODERATE/FREE   Atypical nevi 02/23/2016   LEFT UPPER BACK MILD /FREE   Atypical nevi 08/23/2016   RIGHT ANT PROX THIGH MILD /FREE   Atypical nevi 07/25/2018   MID LOWER BACK MODERATE   Atypical nevi 01/22/2019   LEFT MID BACK MILD /FREE   Atypical nevi 01/22/2019   LEFT NECK MILD   Atypical nevi 08/06/2019   LEFT INNER BREAST MODERATE W/S   Atypical nevi 08/06/2019   LEFT OUTER SIDE MILD/FREE   Blood transfusion without reported diagnosis    hx of   Breast cancer (HCC) 2022   CAD (coronary artery disease), native coronary artery 02/07/2019   Minimal CAD at the time of cath in 2016 in [facility]   Family history of breast cancer 08/19/2021   Family history of pancreatic cancer 08/19/2021   Fatty liver 05/21/2020   Gastric polyps 10/02/2018   EGD 03/2015; benign   GERD (gastroesophageal reflux disease)  10/02/2018   on meds   History of melanoma 10/02/2018   Abdomen 2012; 2013; s/p local excisions.    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 DISTAL LOWER LEG TREATED BY DR. Leda Quail   Sleep apnea    Sleep apnea 05/26/2020   uses CPAP   Tricuspid regurgitation    moderate by echo 04/2023   Past Surgical History:  Procedure Laterality Date   ABDOMINAL HYSTERECTOMY  1990   APPENDECTOMY  1962   BLADDER SUSPENSION  1990   BREAST LUMPECTOMY WITH RADIOACTIVE SEED LOCALIZATION Left 09/09/2021   Procedure: LEFT BREAST LUMPECTOMY WITH RADIOACTIVE SEED LOCALIZATION X2;  Surgeon: Manus Rudd, MD;  Location: [facility];  Service: General;  Laterality: Left;   COLONOSCOPY  2016   in [place]-hx of polyps  EYE SURGERY  2013 torn retina laser   MENISCUS REPAIR Right 2001   right knee   thumb surgery Left    TONSILLECTOMY AND ADENOIDECTOMY     tuabl ligation     TUBAL LIGATION  1975   WISDOM TOOTH EXTRACTION     Patient Active Problem List   Diagnosis Date Noted   Tricuspid regurgitation    Primary osteoarthritis of right shoulder 04/11/2023   Trochanteric bursitis of both hips 04/11/2023   Atrial tachycardia 02/09/2023   Osteopenia 09/19/2022   SVT (supraventricular tachycardia) 07/11/2022   Genetic testing 08/30/2021   Family history of breast cancer 08/19/2021   Family history of pancreatic cancer 08/19/2021   Malignant neoplasm of overlapping sites of left breast in female, estrogen receptor positive (HCC) 08/16/2021   Hepatic steatosis 07/28/2020   OSA (obstructive sleep apnea)  05/26/2020   Coronary artery disease involving native coronary artery of native heart without angina pectoris 02/07/2019   GERD (gastroesophageal reflux disease) 10/02/2018   Essential hypertension 10/02/2018   Mixed hyperlipidemia 10/02/2018   Osteoporosis of forearm 10/02/2018   Primary osteoarthritis involving multiple joints 10/02/2018   Pulmonary hypertension, primary (HCC) 10/02/2018   History of melanoma 10/02/2018   Polyp of colon, adenomatous 10/02/2018   Gastric polyps 10/02/2018    PCP: Asencion Partridge   REFERRING PROVIDER: Asencion Partridge   REFERRING DIAG: bil hip pain, low back pain, R knee pain   THERAPY DIAG:  Other low back pain  Bilateral hip pain  Chronic pain of right knee  Rationale for Evaluation and Treatment: Rehabilitation  ONSET DATE:    SUBJECTIVE:   SUBJECTIVE STATEMENT:  Pt states very minimal pain in hips . Back just slightly sore at times, with standing in the kitchen.  She reports having some heart "palpitations" this morning. She reports feeling fine, and that this has been her baseline for some time. She was done wearing her heart monitor yesterday.  Start of session: pulse: 72  Eval: Pt states several areas of pain. She has pain and OA in R knee, and is scheduled for R TKA in  November. L knee non painful at this time. She also has pain in Low back: ongoing for about 6 months. Prednisone helped, also taking tylenol arthritis, up to 4/day and topical creams. States increased pain with Increased activity, walking and standing. Pain location is  Very low, center of back. Sitting more comfortable. Not currently doing any exercise for back. Hips are both painful L and R. Flare up from time to time. Has had injections in the past. But no PT.    PERTINENT HISTORY: Breast CA 2022, new heart valve issue. , CAD,  PAIN:  Are you having pain? Yes: NPRS scale: 0-3/10 Pain location: back  Pain description: sore , tight  Aggravating factors: standing,  walking  Relieving factors: sitting, rest   Are you having pain? Yes: NPRS scale: 0-1/10 Pain location: Bil hips  Pain description: sore  Aggravating factors: standing, walking, activity  Relieving factors: rest   Are you having pain? Yes: NPRS scale: 0-2 /10 Pain location: R knee  Pain description: sore  Aggravating factors: increased activity  Relieving factors: none stated   PRECAUTIONS: None  WEIGHT BEARING RESTRICTIONS: No  FALLS:  Has patient fallen in last 6 months? No 1 fall 1 year ago, at home.  No injury   PLOF: Independent  PATIENT GOALS:  Decreased pain   NEXT MD VISIT:   OBJECTIVE:   DIAGNOSTIC FINDINGS:  PATIENT SURVEYS:   Fotot- low back-  initial:  51.6 Visit  6:  57.3   COGNITION: Overall cognitive status: Within functional limits for tasks assessed     SENSATION: WFL  EDEMA:   POSTURE:    No Significant postural limitations  PALPATION: Tender to palpate bil hip- greater trochanters Tenderness in L4/5 region centrally, and into bil SI, top of sacrum, and into bil glutes    LOWER EXTREMITY ROM:  Hips: WFL Back: [facility]  Active /Passive ROM Left eval Right eval  Hip flexion    Hip extension    Hip abduction    Hip adduction    Hip internal rotation    Hip external rotation    Knee flexion wfl 110  Knee extension  -15  Ankle dorsiflexion    Ankle plantarflexion    Ankle inversion    Ankle eversion     (Blank rows = not tested)  LOWER EXTREMITY MMT:  MMT Left eval Right  eval  Hip flexion 4  4  Hip extension    Hip abduction 4 4  Hip adduction    Hip internal rotation    Hip external rotation    Knee flexion 5 4  Knee extension 5 4  Ankle dorsiflexion    Ankle plantarflexion    Ankle inversion    Ankle eversion     (Blank rows = not tested)   LOWER EXTREMITY SPECIAL TESTS:    GAIT: Distance walked: 100 ft  Assistive device utilized: None Level of assistance: Complete Independence Comments: varus position  of R knee, mild increase in lateral displacement   TODAY'S TREATMENT:                                                                                                                              DATE:   06/07/23. Therapeutic Exercise: Aerobic:   Supine:  Hip ER fallouts x 15;  Clams GTB x15; quad sets x 15 on R;  S/L : hip abd x 10 bil;  Seated: Standing:  Hip abd 3 x 5  bil;   Stretches:     Neuromuscular Re-education: Manual Therapy:  PROM and joint mobs for knee flex and ext on R;  STM/roller to bil hips/glute med.  Self Care:   Therapeutic Exercise: Aerobic:  Bike L 1 x 6 min  Supine: SLR 1 x 10 bil;    quad sets x 10 on R; Clams GTB x 20;  Seated:   LAQ 2.5 lb x 15 bil;  Standing:  Hip abd 2 x 10 bil; March x 15 no UE support;   Stretches:    Hip ER fallouts x 10;  Neuromuscular Re-education: Manual Therapy: Self Care:   Therapeutic Exercise: Aerobic: Supine:  pelvic tilts x 20;  SLR 2 x 5 bil;   TA with education on optimal contraction, with breathing 2 x 10;  S/L: hip abd x10 bil    clams   x  10 bil;  Seated:  education on optimal seated position, neutral pelvis, and hip hinge motion.  Sit to stand 2 x 5;  Standing: Stretches:   SKTC 30 sec x 3 bil;   Hip ER fallouts x 10;  Neuromuscular Re-education: Manual Therapy: PROM for R knee flex and ext,  Therapeutic Activity: Self Care:    PATIENT EDUCATION:  Education details: updated and reviewed HEP,  Person educated: Patient Education method: Explanation, Demonstration, Tactile cues, Verbal cues, and Handouts Education comprehension: verbalized understanding, returned demonstration, verbal cues required, tactile cues required, and needs further education   HOME EXERCISE PROGRAM: Access Code: [email or web address]   ASSESSMENT:  CLINICAL IMPRESSION: 06/07/2023 Pt overall with improving pain. She has decreased pain in hips today with standing hip abd. She does have continued soreness with palpation of hip/gr troch but  improving pain with activity. Pt still awaiting testing to be cleared for total knee replacement. Pt to benefit from continued care for back, hip pain and strengthening for R knee. Plan to continue more standing strengthening next visit as able. Pt asymptomatic during and after treatment. Pulse levels normal throughout and BP stable at 116/62. We discussed any changes in heart racing or her not feeling well that she will go to ER, pt states understanding.    Eval: Patient presents with primary complaint of  pain in low back. She has pain in low lumbar and sacral region. She also has  pain in bil hips, consistent with bursitis. R knee has significant OA and she is scheduled for replacement in November. She has ROM loss for flexion and extension in knee, as well as mild gait deficits. Hip pain likely influenced by knee dysfunction and gait deficits. Pt with pain in multiple areas, and has lack of effective HEP for management. Pt with decreased ability for full functional activities. Pt will  benefit from skilled PT to improve deficits and pain and to return to PLOF.   OBJECTIVE IMPAIRMENTS: Abnormal gait, decreased activity tolerance, decreased mobility, decreased ROM, decreased strength, increased muscle spasms, improper body mechanics, and pain.   ACTIVITY LIMITATIONS: lifting, bending, sitting, standing, squatting, stairs, transfers, hygiene/grooming, and locomotion level  PARTICIPATION LIMITATIONS: meal prep, cleaning, laundry, shopping, and community activity  PERSONAL FACTORS: Time since onset of injury/illness/exacerbation and 1 comorbidity: multiple pain locations  are also affecting patient's functional outcome.   REHAB POTENTIAL: Good  CLINICAL DECISION MAKING: Stable/uncomplicated  EVALUATION COMPLEXITY: Low   GOALS: Goals reviewed with patient? Yes   SHORT TERM GOALS: Target date: 05/26/2023   Pt to be independent with initial HEP  Goal status: MET   LONG TERM GOALS: Target  date: 07/07/2023  Pt to be independent with final HEP  Goal status: INITIAL  2.  Pt to report improved hip strength to 0-2/10 with activity   Goal status: INITIAL  3.  Pt to report decreased pain in low back to 0-3/10 with standing activity .   Goal status: INITIAL  4.  Pt to demo R knee being at max capacity for ROM and strength, to ensure best pre-op function.   Goal status: INITIAL    PLAN:  PT FREQUENCY: 1-2x/week  PT DURATION: 8 weeks  PLANNED INTERVENTIONS: Therapeutic exercises, Therapeutic activity, Neuromuscular re-education, Patient/Family education, Self Care, Joint mobilization, Joint manipulation, Stair training, Orthotic/Fit training, DME instructions, Aquatic Therapy, Dry Needling, Electrical stimulation, Cryotherapy, Moist heat, Taping, Ultrasound, Ionotophoresis 4mg /ml Dexamethasone, Manual therapy,  Vasopneumatic device, Traction, Spinal manipulation, Spinal mobilization,Balance training, Gait training,   PLAN FOR NEXT  SESSION:  try bridging, continue hip mobility and strength.    Sedalia Muta, PT, DPT 11:09 AM  06/07/23

## 2023-06-09 ENCOUNTER — Ambulatory Visit: Payer: Medicare Other

## 2023-06-09 ENCOUNTER — Encounter: Payer: Medicare Other | Admitting: Physical Therapy

## 2023-06-09 DIAGNOSIS — R002 Palpitations: Secondary | ICD-10-CM | POA: Diagnosis not present

## 2023-06-13 ENCOUNTER — Telehealth: Payer: Self-pay

## 2023-06-13 NOTE — Telephone Encounter (Signed)
Call to patient to discuss heart monitor results showed multiple episodes of atrial tachycardia which comes from the top of the heart.  Dr. Mayford Knife recommends to increase Cardizem CD to 300mg  daily and followup with extender in 4 weeks. Patient states she has several concerns she wants to address before she changes her cardizem dose. She states that she thought Dr. Mayford Knife told her at her visit on 05/18/23 that she could not go up on cardizem because her BP was too low. Patient also concerned about being on lisinopril while her Cr is slightly elevated.Patient has BMP scheduled for tomorrow 06/14/23 and states she also has a cath coming up and is wondering if she needs to wait until after cath to change her cardizem dose. Patient responses forwarded to Dr. Mayford Knife.

## 2023-06-13 NOTE — Telephone Encounter (Signed)
-----   Message from Nurse Kinnie Feil C sent at 06/13/2023 10:05 AM EDT -----  ----- Message ----- From: Quintella Reichert, MD Sent: 06/09/2023   6:46 PM EDT To: Willow Ora, MD; [facility] Triage  Heart monitor showed multiple episodes of atrial tachycardia which comes from the top of the heart.  I would like her to increase Cardizem CD to 300mg  daily and followup with extender in 4 weeks

## 2023-06-14 ENCOUNTER — Encounter: Payer: Medicare Other | Admitting: Physical Therapy

## 2023-06-14 ENCOUNTER — Ambulatory Visit: Payer: Medicare Other | Attending: Cardiology

## 2023-06-14 DIAGNOSIS — R7989 Other specified abnormal findings of blood chemistry: Secondary | ICD-10-CM | POA: Diagnosis not present

## 2023-06-15 ENCOUNTER — Inpatient Hospital Stay: Payer: Medicare Other

## 2023-06-15 ENCOUNTER — Telehealth: Payer: Self-pay | Admitting: *Deleted

## 2023-06-15 ENCOUNTER — Inpatient Hospital Stay: Payer: Medicare Other | Attending: Hematology

## 2023-06-15 VITALS — BP 137/58 | HR 61 | Temp 97.8°F | Resp 18

## 2023-06-15 DIAGNOSIS — D519 Vitamin B12 deficiency anemia, unspecified: Secondary | ICD-10-CM

## 2023-06-15 DIAGNOSIS — D649 Anemia, unspecified: Secondary | ICD-10-CM | POA: Diagnosis not present

## 2023-06-15 DIAGNOSIS — E538 Deficiency of other specified B group vitamins: Secondary | ICD-10-CM | POA: Insufficient documentation

## 2023-06-15 DIAGNOSIS — Z853 Personal history of malignant neoplasm of breast: Secondary | ICD-10-CM | POA: Diagnosis not present

## 2023-06-15 DIAGNOSIS — M8580 Other specified disorders of bone density and structure, unspecified site: Secondary | ICD-10-CM

## 2023-06-15 LAB — BASIC METABOLIC PANEL
BUN/Creatinine Ratio: 16 (ref 12–28)
BUN: 21 mg/dL (ref 8–27)
CO2: 21 mmol/L (ref 20–29)
Calcium: 9.9 mg/dL (ref 8.7–10.3)
Chloride: 101 mmol/L (ref 96–106)
Creatinine, Ser: 1.28 mg/dL — ABNORMAL HIGH (ref 0.57–1.00)
Glucose: 108 mg/dL — ABNORMAL HIGH (ref 70–99)
Potassium: 4.2 mmol/L (ref 3.5–5.2)
Sodium: 139 mmol/L (ref 134–144)
eGFR: 43 mL/min/{1.73_m2} — ABNORMAL LOW (ref >59)

## 2023-06-15 LAB — VITAMIN B12: Vitamin B-12: 251 pg/mL (ref 180–914)

## 2023-06-15 MED ORDER — CYANOCOBALAMIN 1000 MCG/ML IJ SOLN
1000.0000 ug | Freq: Once | INTRAMUSCULAR | Status: AC
  Administered 2023-06-15: 1000 ug via INTRAMUSCULAR
  Filled 2023-06-15: qty 1

## 2023-06-15 NOTE — Telephone Encounter (Addendum)
Cardiac Catheterization scheduled at [facility] for: Tuesday June 20, 2023 1:30 PM Arrival time [facility] Entr[facility] A at: 8:30 AM-pre-procedure hydration  Nothing to eat after midnight prior to procedure, clear liquids until 5 AM day of procedure.  Medication instructions: -Hold:  Lisinopril/Lasix-day before and day of hospital-per protocol -GFR 43  Pt reports she is not currently taking Celebrex. -Other usual morning medications can be taken with sips of water including aspirin 81 mg.  Plan to go home the same day, you will only stay overnight if medically necessary.  You must have responsible adult to drive you home.  Someone must be with you the first 24 hours after you arrive home.  Reviewed procedure instructions with patient.

## 2023-06-16 ENCOUNTER — Encounter: Payer: Medicare Other | Admitting: Physical Therapy

## 2023-06-16 LAB — INTRINSIC FACTOR ANTIBODIES: Intrinsic Factor: 1 [AU]/ml (ref 0.0–1.1)

## 2023-06-18 ENCOUNTER — Other Ambulatory Visit: Payer: Self-pay | Admitting: Hematology

## 2023-06-18 DIAGNOSIS — C50812 Malignant neoplasm of overlapping sites of left female breast: Secondary | ICD-10-CM

## 2023-06-18 DIAGNOSIS — D519 Vitamin B12 deficiency anemia, unspecified: Secondary | ICD-10-CM

## 2023-06-19 ENCOUNTER — Telehealth: Payer: Self-pay | Admitting: Hematology

## 2023-06-19 ENCOUNTER — Ambulatory Visit (INDEPENDENT_AMBULATORY_CARE_PROVIDER_SITE_OTHER): Payer: Medicare Other | Admitting: Physical Therapy

## 2023-06-19 ENCOUNTER — Telehealth: Payer: Self-pay

## 2023-06-19 ENCOUNTER — Encounter: Payer: Self-pay | Admitting: Physical Therapy

## 2023-06-19 DIAGNOSIS — M25552 Pain in left hip: Secondary | ICD-10-CM

## 2023-06-19 DIAGNOSIS — M25551 Pain in right hip: Secondary | ICD-10-CM | POA: Diagnosis not present

## 2023-06-19 DIAGNOSIS — M5459 Other low back pain: Secondary | ICD-10-CM

## 2023-06-19 DIAGNOSIS — M25561 Pain in right knee: Secondary | ICD-10-CM

## 2023-06-19 DIAGNOSIS — G8929 Other chronic pain: Secondary | ICD-10-CM | POA: Diagnosis not present

## 2023-06-19 NOTE — Telephone Encounter (Signed)
Called the pt about her B 12 results, and her recommendation  from Dr. Mosetta Putt about her B 12 levels. I let the pt know that I will send scheduling a message to get her set up for her B 12 weekly inj/labs. Pt verbalized understanding.   Jarelis Ehlert M,CMA

## 2023-06-19 NOTE — Therapy (Addendum)
OUTPATIENT PHYSICAL THERAPY LOWER EXTREMITY TREATMENT    Patient Name: Madison Clements MRN: [id number] DOB:09-Mar-1946, 77 y.o., female Today's Date: 06/19/2023  END OF SESSION:  PT End of Session - 06/19/23 1021     Visit Number 8    Number of Visits 16    Date for PT Re-Evaluation 07/07/23    Authorization Type Medicare    PT Start Time 1023    PT Stop Time 1101    PT Time Calculation (min) 38 min    Activity Tolerance Patient tolerated treatment well    Behavior During Therapy WFL for tasks assessed/performed                Past Medical History:  Diagnosis Date   Allergy 1990   Morphine following surgery   Arthritis    generalized   Atypical mole 02/24/2015   RGHT LATERAL THIGH MODERATE   Atypical mole 01/20/2022   Left Breast (mild)   Atypical mole 01/20/2022   Mid Back (mild)   Atypical nevi 02/24/2015   RIGHT NECK MILD   Atypical nevi 08/25/2015   LEFT UPPER ARM MILD   Atypical nevi 08/25/2015   LEFT LATERAL FOREARM MILD/FREE   Atypical nevi 02/23/2016   LEFT THIGH MODERATE/FREE   Atypical nevi 02/23/2016   LEFT MEDIAL LEG MODERATE/FREE   Atypical nevi 02/23/2016   LEFT UPPER BACK MILD /FREE   Atypical nevi 08/23/2016   RIGHT ANT PROX THIGH MILD /FREE   Atypical nevi 07/25/2018   MID LOWER BACK MODERATE   Atypical nevi 01/22/2019   LEFT MID BACK MILD /FREE   Atypical nevi 01/22/2019   LEFT NECK MILD   Atypical nevi 08/06/2019   LEFT INNER BREAST MODERATE W/S   Atypical nevi 08/06/2019   LEFT OUTER SIDE MILD/FREE   Blood transfusion without reported diagnosis    hx of   Breast cancer (HCC) 2022   CAD (coronary artery disease), native coronary artery 02/07/2019   Minimal CAD at the time of cath in 2016 in [facility] [place]   Family history of breast cancer 08/19/2021   Family history of pancreatic cancer 08/19/2021   Fatty liver 05/21/2020   Gastric polyps 10/02/2018   EGD 03/2015; benign   GERD (gastroesophageal reflux disease)  10/02/2018   on meds   History of melanoma 10/02/2018   Abdomen 2012; 2013; s/p local excisions.    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 DISTAL LOWER LEG TREATED BY DR. Leda Quail   Sleep apnea    Sleep apnea 05/26/2020   uses CPAP   Tricuspid regurgitation    moderate by echo 04/2023   Past Surgical History:  Procedure Laterality Date   ABDOMINAL HYSTERECTOMY  1990   APPENDECTOMY  1962   BLADDER SUSPENSION  1990   BREAST LUMPECTOMY WITH RADIOACTIVE SEED LOCALIZATION Left 09/09/2021   Procedure: LEFT BREAST LUMPECTOMY WITH RADIOACTIVE SEED LOCALIZATION X2;  Surgeon: Manus Rudd, MD;  Location: [facility];  Service: General;  Laterality: Left;   COLONOSCOPY  2016   in -hx of polyps  EYE SURGERY  2013 torn retina laser   MENISCUS REPAIR Right 2001   right knee   thumb surgery Left    TONSILLECTOMY AND ADENOIDECTOMY     tuabl ligation     TUBAL LIGATION  1975   WISDOM TOOTH EXTRACTION     Patient Active Problem List   Diagnosis Date Noted   Tricuspid regurgitation    Primary osteoarthritis of right shoulder 04/11/2023   Trochanteric bursitis of both hips 04/11/2023   Atrial tachycardia (HCC) 02/09/2023   Osteopenia 09/19/2022   SVT (supraventricular tachycardia) (HCC) 07/11/2022   Genetic testing 08/30/2021   Family history of breast cancer 08/19/2021   Family history of pancreatic cancer 08/19/2021   Malignant neoplasm of overlapping sites of left breast in female, estrogen receptor positive (HCC) 08/16/2021   Hepatic steatosis 07/28/2020   OSA (obstructive sleep  apnea) 05/26/2020   Coronary artery disease involving native coronary artery of native heart without angina pectoris 02/07/2019   GERD (gastroesophageal reflux disease) 10/02/2018   Essential hypertension 10/02/2018   Mixed hyperlipidemia 10/02/2018   Osteoporosis of forearm 10/02/2018   Primary osteoarthritis involving multiple joints 10/02/2018   Pulmonary hypertension, primary (HCC) 10/02/2018   History of melanoma 10/02/2018   Polyp of colon, adenomatous 10/02/2018   Gastric polyps 10/02/2018    PCP: Asencion Partridge   REFERRING PROVIDER: Asencion Partridge   REFERRING DIAG: bil hip pain, low back pain, R knee pain   THERAPY DIAG:  Other low back pain  Bilateral hip pain  Chronic pain of right knee  Rationale for Evaluation and Treatment: Rehabilitation  ONSET DATE:    SUBJECTIVE:   SUBJECTIVE STATEMENT: Last Saturday knee started hurting more. Feeling a bit better now, but still sore.  Also has increased pain in L low back, has not improved.  Having heart cath tomorrow.  Has been off celebrex and tylenol arthritis.    Eval: Pt states several areas of pain. She has pain and OA in R knee, and is scheduled for R TKA in  November. L knee non painful at this time. She also has pain in Low back: ongoing for about 6 months. Prednisone helped, also taking tylenol arthritis, up to 4/day and topical creams. States increased pain with Increased activity, walking and standing. Pain location is  Very low, center of back. Sitting more comfortable. Not currently doing any exercise for back. Hips are both painful L and R. Flare up from time to time. Has had injections in the past. But no PT.    PERTINENT HISTORY: Breast CA 2022, new heart valve issue. , CAD,  PAIN:  Are you having pain? Yes: NPRS scale: 0-3/10 Pain location: back  Pain description: sore , tight  Aggravating factors: standing, walking  Relieving factors: sitting, rest   Are you having pain? Yes: NPRS scale: 0-1/10 Pain  location: Bil hips  Pain description: sore  Aggravating factors: standing, walking, activity  Relieving factors: rest   Are you having pain? Yes: NPRS scale: 0-2 /10 Pain location: R knee  Pain description: sore  Aggravating factors: increased activity  Relieving factors: none stated   PRECAUTIONS: None  WEIGHT BEARING RESTRICTIONS: No  FALLS:  Has patient fallen in last 6 months? No 1 fall 1 year ago, at home.  No injury   PLOF: Independent  PATIENT GOALS:  Decreased pain   NEXT MD VISIT:   OBJECTIVE:   DIAGNOSTIC FINDINGS:   PATIENT SURVEYS:   Fotot- low back-  initial:  51.6 Visit  6:  57.3   COGNITION: Overall cognitive status: Within functional limits for tasks assessed     SENSATION: WFL  EDEMA:   POSTURE:    No Significant postural limitations  PALPATION: Tender to palpate bil hip- greater trochanters Tenderness in L4/5 region centrally, and into bil SI, top of sacrum, and into bil glutes    LOWER EXTREMITY ROM:  Hips: WFL Back: [facility]  Active /Passive ROM Left eval Right eval  Hip flexion    Hip extension    Hip abduction    Hip adduction    Hip internal rotation    Hip external rotation    Knee flexion wfl 110  Knee extension  -15  Ankle dorsiflexion    Ankle plantarflexion    Ankle inversion    Ankle eversion     (Blank rows = not tested)  LOWER EXTREMITY MMT:  MMT Left eval Right  eval  Hip flexion 4  4  Hip extension    Hip abduction 4 4  Hip adduction    Hip internal rotation    Hip external rotation    Knee flexion 5 4  Knee extension 5 4  Ankle dorsiflexion    Ankle plantarflexion    Ankle inversion    Ankle eversion     (Blank rows = not tested)   LOWER EXTREMITY SPECIAL TESTS:    GAIT: Distance walked: 100 ft  Assistive device utilized: None Level of assistance: Complete Independence Comments: varus position of R knee, mild increase in lateral displacement   TODAY'S TREATMENT:                                                                                                                               DATE:   06/19/23. Therapeutic Exercise: Aerobic:   Supine:  Hip ER fallouts x 15;  Clams GTB x15; quad sets x 15 on R; SLR x 10 bil with TA;  S/L : hip abd x 10 bil;  Seated: Standing:  Hip abd 3 x 5  bil;   Stretches:    LTR x 10;  Neuromuscular Re-education: Manual Therapy:  PROM and joint mobs for knee flex and ext on R;   Self Care:   Therapeutic Exercise: Aerobic:  Bike L 1 x 6 min  Supine: SLR 1 x 10 bil;    quad sets x 10 on R; Clams GTB x 20;  Seated:   LAQ 2.5 lb x 15 bil;  Standing:  Hip abd 2 x 10 bil; March x 15 no UE support;   Stretches:    Hip ER fallouts x 10;  Neuromuscular Re-education: Manual Therapy: Self Care:   Therapeutic Exercise: Aerobic: Supine:  pelvic tilts x 20;  SLR 2 x 5 bil- cueing for TA for decreased back pain with exercise.;    S/L: hip abd x10 bil    clams   x 10 bil;  Seated:  education on optimal seated position, neutral pelvis, and  hip hinge motion.  Sit to stand 2 x 5;  Standing: Stretches:   SKTC 30 sec x 3 bil;   Hip ER fallouts x 10; supine piriformis 30 sec x 2 on L;  Neuromuscular Re-education: Manual Therapy: PROM for R knee flex and ext,  Therapeutic Activity: Self Care:    PATIENT EDUCATION:  Education details: updated and reviewed HEP,  Person educated: Patient Education method: Explanation, Demonstration, Tactile cues, Verbal cues, and Handouts Education comprehension: verbalized understanding, returned demonstration, verbal cues required, tactile cues required, and needs further education   HOME EXERCISE PROGRAM: Access Code: [email or web address]   ASSESSMENT:  CLINICAL IMPRESSION: 06/19/2023 Pt with increased soreness in L side of low back today. She has minimal pain in hips and has been doing HEP for this. We reviewed need for continued HEP even if back is  sore. Pt with no increased pain in back with activities today. Pt  to benefit from continued care. She is having heart cath tomorrow, will wait until next week to return.    Eval: Patient presents with primary complaint of  pain in low back. She has pain in low lumbar and sacral region. She also has  pain in bil hips, consistent with bursitis. R knee has significant OA and she is scheduled for replacement in November. She has ROM loss for flexion and extension in knee, as well as mild gait deficits. Hip pain likely influenced by knee dysfunction and gait deficits. Pt with pain in multiple areas, and has lack of effective HEP for management. Pt with decreased ability for full functional activities. Pt will  benefit from skilled PT to improve deficits and pain and to return to PLOF.   OBJECTIVE IMPAIRMENTS: Abnormal gait, decreased activity tolerance, decreased mobility, decreased ROM, decreased strength, increased muscle spasms, improper body mechanics, and pain.   ACTIVITY LIMITATIONS: lifting, bending, sitting, standing, squatting, stairs, transfers, hygiene/grooming, and locomotion level  PARTICIPATION LIMITATIONS: meal prep, cleaning, laundry, shopping, and community activity  PERSONAL FACTORS: Time since onset of injury/illness/exacerbation and 1 comorbidity: multiple pain locations  are also affecting patient's functional outcome.   REHAB POTENTIAL: Good  CLINICAL DECISION MAKING: Stable/uncomplicated  EVALUATION COMPLEXITY: Low   GOALS: Goals reviewed with patient? Yes   SHORT TERM GOALS: Target date: 05/26/2023   Pt to be independent with initial HEP  Goal status: MET   LONG TERM GOALS: Target date: 07/07/2023  Pt to be independent with final HEP  Goal status: INITIAL  2.  Pt to report improved hip strength to 0-2/10 with activity   Goal status: INITIAL  3.  Pt to report decreased pain in low back to 0-3/10 with standing activity .   Goal status: INITIAL  4.  Pt to demo R knee being at max capacity for ROM and strength, to ensure  best pre-op function.   Goal status: INITIAL    PLAN:  PT FREQUENCY: 1-2x/week  PT DURATION: 8 weeks  PLANNED INTERVENTIONS: Therapeutic exercises, Therapeutic activity, Neuromuscular re-education, Patient/Family education, Self Care, Joint mobilization, Joint manipulation, Stair training, Orthotic/Fit training, DME instructions, Aquatic Therapy, Dry Needling, Electrical stimulation, Cryotherapy, Moist heat, Taping, Ultrasound, Ionotophoresis 4mg /ml Dexamethasone, Manual therapy,  Vasopneumatic device, Traction, Spinal manipulation, Spinal mobilization,Balance training, Gait training,   PLAN FOR NEXT SESSION:  try bridging, continue hip mobility and strength.    Sedalia Muta, PT, DPT 10:22 AM  06/19/23    PHYSICAL THERAPY DISCHARGE SUMMARY  Visits from Start of Care: 8   Plan: Patient agrees to discharge.  Patient  goals were partially met. Patient is being discharged due to not returning since last visit.   Sedalia Muta, PT, DPT 1:40 PM  08/21/23

## 2023-06-20 ENCOUNTER — Other Ambulatory Visit: Payer: Self-pay

## 2023-06-20 ENCOUNTER — Ambulatory Visit (HOSPITAL_COMMUNITY)
Admission: RE | Admit: 2023-06-20 | Discharge: 2023-06-20 | Disposition: A | Discharge status: Home / Self Care | Payer: Medicare Other | Attending: Cardiology | Admitting: Cardiology

## 2023-06-20 ENCOUNTER — Encounter (HOSPITAL_COMMUNITY)
Admission: RE | Disposition: A | Discharge status: Home / Self Care | Payer: Self-pay | Source: Home / Self Care | Attending: Cardiology

## 2023-06-20 DIAGNOSIS — I2510 Atherosclerotic heart disease of native coronary artery without angina pectoris: Secondary | ICD-10-CM | POA: Insufficient documentation

## 2023-06-20 DIAGNOSIS — R0602 Shortness of breath: Secondary | ICD-10-CM | POA: Insufficient documentation

## 2023-06-20 DIAGNOSIS — I272 Pulmonary hypertension, unspecified: Secondary | ICD-10-CM | POA: Diagnosis not present

## 2023-06-20 HISTORY — PX: RIGHT HEART CATH AND CORONARY ANGIOGRAPHY: CATH118264

## 2023-06-20 LAB — POCT I-STAT EG7
Acid-base deficit: 3 mmol/L — ABNORMAL HIGH (ref 0.0–2.0)
Acid-base deficit: 3 mmol/L — ABNORMAL HIGH (ref 0.0–2.0)
Bicarbonate: 21.6 mmol/L (ref 20.0–28.0)
Bicarbonate: 21.9 mmol/L (ref 20.0–28.0)
Calcium, Ion: 1.3 mmol/L (ref 1.15–1.40)
Calcium, Ion: 1.32 mmol/L (ref 1.15–1.40)
HCT: 27 % — ABNORMAL LOW (ref 36.0–46.0)
HCT: 27 % — ABNORMAL LOW (ref 36.0–46.0)
Hemoglobin: 9.2 g/dL — ABNORMAL LOW (ref 12.0–15.0)
Hemoglobin: 9.2 g/dL — ABNORMAL LOW (ref 12.0–15.0)
O2 Saturation: 62 %
O2 Saturation: 62 %
Potassium: 3.7 mmol/L (ref 3.5–5.1)
Potassium: 3.7 mmol/L (ref 3.5–5.1)
Sodium: 139 mmol/L (ref 135–145)
Sodium: 139 mmol/L (ref 135–145)
TCO2: 23 mmol/L (ref 22–32)
TCO2: 23 mmol/L (ref 22–32)
pCO2, Ven: 35.8 mm[Hg] — ABNORMAL LOW (ref 44–60)
pCO2, Ven: 36.4 mm[Hg] — ABNORMAL LOW (ref 44–60)
pH, Ven: 7.387 (ref 7.25–7.43)
pH, Ven: 7.389 (ref 7.25–7.43)
pO2, Ven: 32 mm[Hg] (ref 32–45)
pO2, Ven: 32 mm[Hg] (ref 32–45)

## 2023-06-20 SURGERY — RIGHT HEART CATH AND CORONARY ANGIOGRAPHY
Anesthesia: LOCAL

## 2023-06-20 MED ORDER — LIDOCAINE HCL (PF) 1 % IJ SOLN
INTRAMUSCULAR | Status: AC
  Filled 2023-06-20: qty 30

## 2023-06-20 MED ORDER — FENTANYL CITRATE (PF) 100 MCG/2ML IJ SOLN
INTRAMUSCULAR | Status: DC | PRN
  Administered 2023-06-20: 25 ug via INTRAVENOUS

## 2023-06-20 MED ORDER — LIDOCAINE HCL (PF) 1 % IJ SOLN
INTRAMUSCULAR | Status: DC | PRN
  Administered 2023-06-20 (×2): 2 mL

## 2023-06-20 MED ORDER — LABETALOL HCL 5 MG/ML IV SOLN: 10.0000 mg | INTRAVENOUS | Status: DC | PRN

## 2023-06-20 MED ORDER — VERAPAMIL HCL 2.5 MG/ML IV SOLN
INTRAVENOUS | Status: DC | PRN
  Administered 2023-06-20: 10 mL via INTRA_ARTERIAL

## 2023-06-20 MED ORDER — IOHEXOL 350 MG/ML SOLN
INTRAVENOUS | Status: DC | PRN
  Administered 2023-06-20: 38 mL

## 2023-06-20 MED ORDER — VERAPAMIL HCL 2.5 MG/ML IV SOLN
INTRAVENOUS | Status: AC
  Filled 2023-06-20: qty 2

## 2023-06-20 MED ORDER — HEPARIN SODIUM (PORCINE) 1000 UNIT/ML IJ SOLN
INTRAMUSCULAR | Status: AC
  Filled 2023-06-20: qty 10

## 2023-06-20 MED ORDER — HYDRALAZINE HCL 20 MG/ML IJ SOLN: 10.0000 mg | INTRAMUSCULAR | Status: DC | PRN

## 2023-06-20 MED ORDER — MIDAZOLAM HCL 2 MG/2ML IJ SOLN
INTRAMUSCULAR | Status: AC
  Filled 2023-06-20: qty 2

## 2023-06-20 MED ORDER — SODIUM CHLORIDE 0.9 % IV SOLN: 250.0000 mL | INTRAVENOUS | Status: DC | PRN

## 2023-06-20 MED ORDER — DILTIAZEM HCL ER COATED BEADS 300 MG PO CP24: 300.0000 mg | ORAL_CAPSULE | Freq: Every day | ORAL | 3 refills | Status: DC

## 2023-06-20 MED ORDER — SODIUM CHLORIDE 0.9 % IV SOLN: INTRAVENOUS | Status: DC

## 2023-06-20 MED ORDER — SODIUM CHLORIDE 0.9 % WEIGHT BASED INFUSION
3.0000 mL/kg/h | INTRAVENOUS | Status: AC
  Administered 2023-06-20: 3 mL/kg/h via INTRAVENOUS

## 2023-06-20 MED ORDER — HEPARIN (PORCINE) IN NACL 1000-0.9 UT/500ML-% IV SOLN
INTRAVENOUS | Status: DC | PRN
  Administered 2023-06-20 (×2): 500 mL

## 2023-06-20 MED ORDER — ONDANSETRON HCL 4 MG/2ML IJ SOLN: 4.0000 mg | Freq: Four times a day (QID) | INTRAMUSCULAR | Status: DC | PRN

## 2023-06-20 MED ORDER — ACETAMINOPHEN 325 MG PO TABS: 650.0000 mg | ORAL_TABLET | ORAL | Status: DC | PRN

## 2023-06-20 MED ORDER — FENTANYL CITRATE (PF) 100 MCG/2ML IJ SOLN
INTRAMUSCULAR | Status: AC
  Filled 2023-06-20: qty 2

## 2023-06-20 MED ORDER — ASPIRIN 81 MG PO CHEW: 81.0000 mg | CHEWABLE_TABLET | ORAL | Status: DC

## 2023-06-20 MED ORDER — MIDAZOLAM HCL 2 MG/2ML IJ SOLN
INTRAMUSCULAR | Status: DC | PRN
  Administered 2023-06-20: 1 mg via INTRAVENOUS

## 2023-06-20 MED ORDER — HEPARIN SODIUM (PORCINE) 1000 UNIT/ML IJ SOLN
INTRAMUSCULAR | Status: DC | PRN
  Administered 2023-06-20: 3000 [IU] via INTRAVENOUS

## 2023-06-20 MED ORDER — SODIUM CHLORIDE 0.9% FLUSH: 3.0000 mL | INTRAVENOUS | Status: DC | PRN

## 2023-06-20 MED ORDER — SODIUM CHLORIDE 0.9 % WEIGHT BASED INFUSION: 1.0000 mL/kg/h | INTRAVENOUS | Status: DC

## 2023-06-20 MED ORDER — SODIUM CHLORIDE 0.9% FLUSH: 3.0000 mL | Freq: Two times a day (BID) | INTRAVENOUS | Status: DC

## 2023-06-20 SURGICAL SUPPLY — 12 items
CATH 5FR JL3.5 JR4 ANG PIG MP (CATHETERS) IMPLANT
CATH BALLN WEDGE 5F 110CM (CATHETERS) IMPLANT
DEVICE RAD TR BAND REGULAR (VASCULAR PRODUCTS) IMPLANT
GLIDESHEATH SLEND SS 6F .021 (SHEATH) IMPLANT
GUIDEWIRE .025 260CM (WIRE) IMPLANT
GUIDEWIRE INQWIRE 1.5J.035X260 (WIRE) IMPLANT
INQWIRE 1.5J .035X260CM (WIRE) ×1
PACK CARDIAC CATHETERIZATION (CUSTOM PROCEDURE TRAY) ×1 IMPLANT
PROTECTION STATION PRESSURIZED (MISCELLANEOUS) ×1
SET ATX-X65L (MISCELLANEOUS) IMPLANT
SHEATH GLIDE SLENDER 4/5FR (SHEATH) IMPLANT
STATION PROTECTION PRESSURIZED (MISCELLANEOUS) IMPLANT

## 2023-06-20 NOTE — Interval H&P Note (Signed)
History and Physical Interval Note:  06/20/2023 1:52 PM  Madison Clements Fellows  has presented today for surgery, with the diagnosis of shortness of breath.  The various methods of treatment have been discussed with the patient and family. After consideration of risks, benefits and other options for treatment, the patient has consented to  Procedure(s): RIGHT/LEFT HEART CATH AND CORONARY ANGIOGRAPHY (N/A) as a surgical intervention.  The patient's history has been reviewed, patient examined, no change in status, stable for surgery.  I have reviewed the patient's chart and labs.  Questions were answered to the patient's satisfaction.     Raffi Milstein [employer]

## 2023-06-20 NOTE — Telephone Encounter (Signed)
Call to patient to explain Dr. Mayford Knife advises to stop lisinopril and increase cardizem cd to 300 mg daily. Patient agrees to plan, all questions answered. Appt w/ APP booked for 07/24/23.

## 2023-06-20 NOTE — Addendum Note (Signed)
Addended by: Rexene Edison L on: 06/20/2023 11:05 AM   Modules accepted: Orders

## 2023-06-20 NOTE — Discharge Instructions (Signed)
Radial Site Care  This sheet gives you information about how to care for yourself after your procedure. Your health care provider may also give you more specific instructions. If you have problems or questions, contact your health care provider. What can I expect after the procedure? After the procedure, it is common to have: Bruising and tenderness at the catheter insertion area. Follow these instructions at home: Medicines Take over-the-counter and prescription medicines only as told by your health care provider. Insertion site care Follow instructions from your health care provider about how to take care of your insertion site. Make sure you: Wash your hands with soap and water before you remove your bandage (dressing). If soap and water are not available, use hand sanitizer. May remove dressing in 24 hours. Check your insertion site every day for signs of infection. Check for: Redness, swelling, or pain. Fluid or blood. Pus or a bad smell. Warmth. Do no take baths, swim, or use a hot tub for 5 days. You may shower 24-48 hours after the procedure. Remove the dressing and gently wash the site with plain soap and water. Pat the area dry with a clean towel. Do not rub the site. That could cause bleeding. Do not apply powder or lotion to the site. Activity  For 24 hours after the procedure, or as directed by your health care provider: Do not flex or bend the affected arm. Do not push or pull heavy objects with the affected arm. Do not drive yourself home from the hospital or clinic. You may drive 24 hours after the procedure. Do not operate machinery or power tools. KEEP ARM ELEVATED THE REMAINDER OF THE DAY. Do not push, pull or lift anything that is heavier than 10 lb for 5 days. Ask your health care provider when it is okay to: Return to work or school. Resume usual physical activities or sports. Resume sexual activity. General instructions If the catheter site starts to  bleed, raise your arm and put firm pressure on the site. If the bleeding does not stop, get help right away. This is a medical emergency. DRINK PLENTY OF FLUIDS FOR THE NEXT 2-3 DAYS. No alcohol consumption for 24 hours after receiving sedation. If you went home on the same day as your procedure, a responsible adult should be with you for the first 24 hours after you arrive home. Keep all follow-up visits as told by your health care provider. This is important. Contact a health care provider if: You have a fever. You have redness, swelling, or yellow drainage around your insertion site. Get help right away if: You have unusual pain at the radial site. The catheter insertion area swells very fast. The insertion area is bleeding, and the bleeding does not stop when you hold steady pressure on the area. Your arm or hand becomes pale, cool, tingly, or numb. These symptoms may represent a serious problem that is an emergency. Do not wait to see if the symptoms will go away. Get medical help right away. Call your local emergency services (911 in the [place] Do not drive yourself to the hospital. Summary After the procedure, it is common to have bruising and tenderness at the site. Follow instructions from your health care provider about how to take care of your radial site wound. Check the wound every day for signs of infection.  This information is not intended to replace advice given to you by your health care provider. Make sure you discuss any questions you have with   your health care provider. Document Revised: 10/04/2017 Document Reviewed: 10/04/2017 Elsevier Patient Education  2020 [employer]  

## 2023-06-21 ENCOUNTER — Encounter (HOSPITAL_COMMUNITY): Payer: Self-pay | Admitting: Cardiology

## 2023-06-23 ENCOUNTER — Inpatient Hospital Stay: Payer: Medicare Other

## 2023-06-23 VITALS — BP 137/63 | HR 71 | Temp 98.4°F | Resp 18

## 2023-06-23 DIAGNOSIS — E538 Deficiency of other specified B group vitamins: Secondary | ICD-10-CM | POA: Diagnosis not present

## 2023-06-23 DIAGNOSIS — Z853 Personal history of malignant neoplasm of breast: Secondary | ICD-10-CM | POA: Diagnosis not present

## 2023-06-23 DIAGNOSIS — D649 Anemia, unspecified: Secondary | ICD-10-CM | POA: Diagnosis not present

## 2023-06-23 DIAGNOSIS — M8580 Other specified disorders of bone density and structure, unspecified site: Secondary | ICD-10-CM

## 2023-06-23 MED ORDER — CYANOCOBALAMIN 1000 MCG/ML IJ SOLN
1000.0000 ug | Freq: Once | INTRAMUSCULAR | Status: AC
  Administered 2023-06-23: 1000 ug via INTRAMUSCULAR
  Filled 2023-06-23: qty 1

## 2023-06-24 ENCOUNTER — Other Ambulatory Visit: Payer: Self-pay | Admitting: Family Medicine

## 2023-06-28 ENCOUNTER — Encounter: Payer: Medicare Other | Admitting: Physical Therapy

## 2023-06-28 ENCOUNTER — Encounter: Payer: Self-pay | Admitting: Hematology

## 2023-06-30 ENCOUNTER — Inpatient Hospital Stay: Payer: Medicare Other

## 2023-06-30 DIAGNOSIS — Z853 Personal history of malignant neoplasm of breast: Secondary | ICD-10-CM | POA: Diagnosis not present

## 2023-06-30 DIAGNOSIS — M8580 Other specified disorders of bone density and structure, unspecified site: Secondary | ICD-10-CM

## 2023-06-30 DIAGNOSIS — D649 Anemia, unspecified: Secondary | ICD-10-CM | POA: Diagnosis not present

## 2023-06-30 DIAGNOSIS — E538 Deficiency of other specified B group vitamins: Secondary | ICD-10-CM | POA: Diagnosis not present

## 2023-06-30 MED ORDER — CYANOCOBALAMIN 1000 MCG/ML IJ SOLN
1000.0000 ug | Freq: Once | INTRAMUSCULAR | Status: AC
  Administered 2023-06-30: 1000 ug via INTRAMUSCULAR
  Filled 2023-06-30: qty 1

## 2023-07-07 ENCOUNTER — Other Ambulatory Visit: Payer: Self-pay

## 2023-07-07 ENCOUNTER — Inpatient Hospital Stay: Payer: Medicare Other

## 2023-07-07 VITALS — BP 140/78 | HR 79 | Resp 18

## 2023-07-07 DIAGNOSIS — M8580 Other specified disorders of bone density and structure, unspecified site: Secondary | ICD-10-CM

## 2023-07-07 DIAGNOSIS — D649 Anemia, unspecified: Secondary | ICD-10-CM | POA: Diagnosis not present

## 2023-07-07 DIAGNOSIS — E538 Deficiency of other specified B group vitamins: Secondary | ICD-10-CM | POA: Diagnosis not present

## 2023-07-07 DIAGNOSIS — Z853 Personal history of malignant neoplasm of breast: Secondary | ICD-10-CM | POA: Diagnosis not present

## 2023-07-07 MED ORDER — CYANOCOBALAMIN 1000 MCG/ML IJ SOLN
1000.0000 ug | Freq: Once | INTRAMUSCULAR | Status: AC
  Administered 2023-07-07: 1000 ug via INTRAMUSCULAR

## 2023-07-13 ENCOUNTER — Inpatient Hospital Stay: Payer: Medicare Other

## 2023-07-13 VITALS — BP 146/72 | HR 70 | Temp 97.8°F | Resp 18

## 2023-07-13 DIAGNOSIS — E538 Deficiency of other specified B group vitamins: Secondary | ICD-10-CM | POA: Diagnosis not present

## 2023-07-13 DIAGNOSIS — C50812 Malignant neoplasm of overlapping sites of left female breast: Secondary | ICD-10-CM

## 2023-07-13 DIAGNOSIS — D519 Vitamin B12 deficiency anemia, unspecified: Secondary | ICD-10-CM

## 2023-07-13 DIAGNOSIS — M8580 Other specified disorders of bone density and structure, unspecified site: Secondary | ICD-10-CM

## 2023-07-13 DIAGNOSIS — D649 Anemia, unspecified: Secondary | ICD-10-CM | POA: Diagnosis not present

## 2023-07-13 DIAGNOSIS — Z853 Personal history of malignant neoplasm of breast: Secondary | ICD-10-CM | POA: Diagnosis not present

## 2023-07-13 LAB — CBC WITH DIFFERENTIAL/PLATELET
Abs Immature Granulocytes: 0.05 10*3/uL (ref 0.00–0.07)
Basophils Absolute: 0 10*3/uL (ref 0.0–0.1)
Basophils Relative: 1 %
Eosinophils Absolute: 0.1 10*3/uL (ref 0.0–0.5)
Eosinophils Relative: 2 %
HCT: 31.5 % — ABNORMAL LOW (ref 36.0–46.0)
Hemoglobin: 10.9 g/dL — ABNORMAL LOW (ref 12.0–15.0)
Immature Granulocytes: 1 %
Lymphocytes Relative: 22 %
Lymphs Abs: 1.1 10*3/uL (ref 0.7–4.0)
MCH: 33 pg (ref 26.0–34.0)
MCHC: 34.6 g/dL (ref 30.0–36.0)
MCV: 95.5 fL (ref 80.0–100.0)
Monocytes Absolute: 0.4 10*3/uL (ref 0.1–1.0)
Monocytes Relative: 8 %
Neutro Abs: 3.4 10*3/uL (ref 1.7–7.7)
Neutrophils Relative %: 66 %
Platelets: 191 10*3/uL (ref 150–400)
RBC: 3.3 MIL/uL — ABNORMAL LOW (ref 3.87–5.11)
RDW: 17.3 % — ABNORMAL HIGH (ref 11.5–15.5)
WBC: 5 10*3/uL (ref 4.0–10.5)
nRBC: 0.8 % — ABNORMAL HIGH (ref 0.0–0.2)

## 2023-07-13 LAB — COMPREHENSIVE METABOLIC PANEL
ALT: 28 U/L (ref 0–44)
AST: 29 U/L (ref 15–41)
Albumin: 4.2 g/dL (ref 3.5–5.0)
Alkaline Phosphatase: 80 U/L (ref 38–126)
Anion gap: 8 (ref 5–15)
BUN: 19 mg/dL (ref 8–23)
CO2: 27 mmol/L (ref 22–32)
Calcium: 9.9 mg/dL (ref 8.9–10.3)
Chloride: 104 mmol/L (ref 98–111)
Creatinine, Ser: 1.01 mg/dL — ABNORMAL HIGH (ref 0.44–1.00)
GFR, Estimated: 57 mL/min — ABNORMAL LOW (ref >60)
Glucose, Bld: 109 mg/dL — ABNORMAL HIGH (ref 70–99)
Potassium: 3.7 mmol/L (ref 3.5–5.1)
Sodium: 139 mmol/L (ref 135–145)
Total Bilirubin: 1 mg/dL (ref 0.3–1.2)
Total Protein: 6.8 g/dL (ref 6.5–8.1)

## 2023-07-13 MED ORDER — CYANOCOBALAMIN 1000 MCG/ML IJ SOLN
1000.0000 ug | Freq: Once | INTRAMUSCULAR | Status: AC
  Administered 2023-07-13: 1000 ug via INTRAMUSCULAR
  Filled 2023-07-13: qty 1

## 2023-07-13 NOTE — Patient Instructions (Signed)
 Vitamin B12 Injection What is this medication? Vitamin B12 (VAHY tuh min B12) prevents and treats low vitamin B12 levels in your body. It is used in people who do not get enough vitamin B12 from their diet or when their digestive tract does not absorb enough. Vitamin B12 plays an important role in maintaining the health of your nervous system and red blood cells. This medicine may be used for other purposes; ask your health care provider or pharmacist if you have questions. COMMON BRAND NAME(S): B-12 Compliance Kit, B-12 Injection Kit, Cyomin, Dodex, LA-12, Nutri-Twelve, Physicians EZ Use B-12, Primabalt, Vitamin Deficiency Injectable System - B12 What should I tell my care team before I take this medication? They need to know if you have any of these conditions: Kidney disease Leber's disease Megaloblastic anemia An unusual or allergic reaction to cyanocobalamin, cobalt, other medications, foods, dyes, or preservatives Pregnant or trying to get pregnant Breast-feeding How should I use this medication? This medication is injected into a muscle or deeply under the skin. It is usually given in a clinic or care team's office. However, your care team may teach you how to inject yourself. Follow all instructions. Talk to your care team about the use of this medication in children. Special care may be needed. Overdosage: If you think you have taken too much of this medicine contact a poison control center or emergency room at once. NOTE: This medicine is only for you. Do not share this medicine with others. What if I miss a dose? If you are given your dose at a clinic or care team's office, call to reschedule your appointment. If you give your own injections, and you miss a dose, take it as soon as you can. If it is almost time for your next dose, take only that dose. Do not take double or extra doses. What may interact with this medication? Alcohol Colchicine This list may not describe all possible  interactions. Give your health care provider a list of all the medicines, herbs, non-prescription drugs, or dietary supplements you use. Also tell them if you smoke, drink alcohol, or use illegal drugs. Some items may interact with your medicine. What should I watch for while using this medication? Visit your care team regularly. You may need blood work done while you are taking this medication. You may need to follow a special diet. Talk to your care team. Limit your alcohol intake and avoid smoking to get the best benefit. What side effects may I notice from receiving this medication? Side effects that you should report to your care team as soon as possible: Allergic reactions--skin rash, itching, hives, swelling of the face, lips, tongue, or throat Swelling of the ankles, hands, or feet Trouble breathing Side effects that usually do not require medical attention (report to your care team if they continue or are bothersome): Diarrhea This list may not describe all possible side effects. Call your doctor for medical advice about side effects. You may report side effects to FDA at 1-800-FDA-1088. Where should I keep my medication? Keep out of the reach of children. Store at room temperature between 15 and 30 degrees C (59 and 85 degrees F). Protect from light. Throw away any unused medication after the expiration date. NOTE: This sheet is a summary. It may not cover all possible information. If you have questions about this medicine, talk to your doctor, pharmacist, or health care provider.  2024 Elsevier/Gold Standard (2021-05-11 00:00:00)

## 2023-07-14 ENCOUNTER — Telehealth: Payer: Self-pay

## 2023-07-14 ENCOUNTER — Encounter: Payer: Self-pay | Admitting: Family Medicine

## 2023-07-14 ENCOUNTER — Ambulatory Visit (INDEPENDENT_AMBULATORY_CARE_PROVIDER_SITE_OTHER): Payer: Medicare Other | Admitting: Family Medicine

## 2023-07-14 VITALS — BP 136/58 | HR 71 | Temp 98.6°F | Ht 63.0 in | Wt 151.6 lb

## 2023-07-14 DIAGNOSIS — C50812 Malignant neoplasm of overlapping sites of left female breast: Secondary | ICD-10-CM

## 2023-07-14 DIAGNOSIS — I10 Essential (primary) hypertension: Secondary | ICD-10-CM | POA: Diagnosis not present

## 2023-07-14 DIAGNOSIS — I361 Nonrheumatic tricuspid (valve) insufficiency: Secondary | ICD-10-CM | POA: Diagnosis not present

## 2023-07-14 DIAGNOSIS — Z170 Estrogen receptor positive status [ER+]: Secondary | ICD-10-CM | POA: Diagnosis not present

## 2023-07-14 DIAGNOSIS — I471 Supraventricular tachycardia, unspecified: Secondary | ICD-10-CM | POA: Diagnosis not present

## 2023-07-14 DIAGNOSIS — M810 Age-related osteoporosis without current pathological fracture: Secondary | ICD-10-CM

## 2023-07-14 DIAGNOSIS — E782 Mixed hyperlipidemia: Secondary | ICD-10-CM

## 2023-07-14 DIAGNOSIS — I270 Primary pulmonary hypertension: Secondary | ICD-10-CM

## 2023-07-14 DIAGNOSIS — K760 Fatty (change of) liver, not elsewhere classified: Secondary | ICD-10-CM

## 2023-07-14 DIAGNOSIS — Z23 Encounter for immunization: Secondary | ICD-10-CM | POA: Diagnosis not present

## 2023-07-14 NOTE — Telephone Encounter (Addendum)
Called patient to relay results below as per Dr. Mosetta Putt. Patient voiced full understanding. Sent message to scheduling to set appointment for labs.    ----- Message from Malachy Mood sent at 07/13/2023  8:49 PM EDT ----- Please let pt know her anemia improved some, but not resolved. Add lab B12 level to today's lab if possible (check with lab), if not, add lab appointment next week 11/7 and order B12 level, thanks   Malachy Mood

## 2023-07-14 NOTE — Progress Notes (Signed)
Subjective  Chief Complaint  Patient presents with   Annual Exam    Pt here for Annual exam and is currently fasting    Hypertension    HPI: Madison Clements is a 77 y.o. female who presents to [facility] [place] today for a Female Wellness Visit. She also has the concerns and/or needs as listed above in the chief complaint. These will be addressed in addition to the Health Maintenance Visit.   Discussed the use of AI scribe software for clinical note transcription with the patient, who gave verbal consent to proceed.  History of Present Illness   The patient, with a history of breast cancer, heart disease, and osteoporosis, presents for a routine follow-up. She reports feeling physically well overall, but has been experiencing some health issues.  The patient's oncologist has been monitoring her low hemoglobin levels and has initiated B12 injections and prenatal vitamins. Despite this treatment, the patient's hemoglobin levels remain slightly below normal.  The patient also underwent a left and right heart catheterization, with results appearing normal upon her review. However, she has been experiencing elevated blood pressure, palpitations, and significant shortness of breath, even with minimal exertion.  In addition to these cardiovascular concerns, the patient reports increased fatigue compared to her usual state. Despite this, she sleeps well and maintains a good appetite. Physical activity is limited due to her cardiovascular symptoms.  The patient also has osteoporosis, for which she is receiving Zemaira. She reports flu-like symptoms after the first dose, which were less severe after premedication with Tylenol and Claritin for subsequent doses.  Lastly, the patient mentions experiencing ankle swelling since her Lasix dosage was halved. She plans to discuss this with her cardiologist at her next appointment.   Patient had lab work done yesterday.  See  below. Assessment and Plan    Encounter Diagnoses  Name Primary?   Essential hypertension Yes   Need for influenza vaccination    Mixed hyperlipidemia    Pulmonary hypertension, primary (HCC)    Malignant neoplasm of overlapping sites of left breast in female, estrogen receptor positive (HCC)    Osteoporosis of forearm    Hepatic steatosis    SVT (supraventricular tachycardia) (HCC)    Nonrheumatic tricuspid valve regurgitation      Atrial Tachycardia and Supraventricular Tachycardia (SVT) Experiencing palpitations and shortness of breath. Symptoms slightly improved but still present. Discussed increasing Cardizem XT to better control symptoms. Risks of untreated tachycardia include worsening symptoms and potential heart failure. Benefits of medication adjustment include symptom control. - Discuss increasing Cardizem XT with cardiologist  Hypertension Blood pressure slightly elevated. Cardiologist increased Cardizem XT to 300 mg and discontinued lisinopril. Risks of hypertension include stroke and heart attack. Benefits of adjusting medication include better blood pressure control. - Discuss blood pressure management with cardiologist - Consider increasing Cardizem XT to 360 mg or reintroducing lisinopril  Tricuspid Regurgitation Ongoing management with cardiologist. No new symptoms reported. Discussed risks of progression and benefits of current management. - Continue current management with cardiologist  Ankle Edema Swelling in ankles after reduction of Lasix. Discussed with cardiologist. Risks of untreated edema include discomfort and potential worsening of heart failure. Benefits of increasing Lasix include reduced swelling. - Increase Lasix dosage - Monitor blood pressure and discuss with PA at follow-up  Breast Cancer Follow-Up Ongoing follow-up with oncologist. Hemoglobin slightly low, currently on B12 injections and prenatal vitamins. Hemoglobin improving but not yet  normal. Discussed risks and benefits of continuing B12  injections and prenatal vitamins. Anticipated outcome is normalization of hemoglobin levels. - Continue B12 injections - Recheck B12 levels  Osteoporosis Currently on Zometa. Initial severe flu-like symptoms managed with premedication (Tylenol and Claritin). Discussed risks of osteoporosis progression and benefits of Zemaira. Anticipated outcome is stabilization of bone density. - Continue Zometa with premedication  General Health Maintenance Partially vaccinated for shingles. Hesitant about second dose but open to future vaccination. Discussed benefits of completing Shingrix vaccine series, including 95% efficacy rate. Cholesterol, liver, and thyroid levels previously checked and normal. - Consider completing Shingrix vaccine series - Annual follow-up unless needed sooner Had first Shingrix vaccination but defers second.  She will continue to consider getting the second vaccination.  She did not have any adverse effects.  Does not like getting shots and pharmacy.  Follow-up - Follow-up with cardiologist on November 12 - Annual follow-up with primary care unless needed sooner.      I spent a total of 46 minutes for this patient encounter. Time spent included preparation, face-to-face counseling with the patient and coordination of care, review of chart and records, and documentation of the encounter.     Patient Active Problem List   Diagnosis Date Noted Date Diagnosed   Tricuspid regurgitation      Priority: High    severe by echo 04/2023 Dr. Mayford Knife: "Echo showed normal heart function EF 60-65%. Also read out as severe leakiness of TV with normal RV and RA size.  I personally reviewed her echo images and I think her TR is moderate. 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  Left heart catheterization in 2016 - minimal vascular disease Echocardiogram 02/2019 with worsening pulm htn: moderate. Mild diastolic dysfunction. Nl EF 2D echo 04/2022 showed normal heart function with mildly dilated RV, mild to moderate TR and mildly calcified AV.  No Pulmonary HTN but RV now enlarged - repeat echo in 6 months for pulmnoary HTN      History of melanoma 10/02/2018     Priority: High    Abdomen 2012; 2013; s/p local excisions.     Polyp of colon, adenomatous 10/02/2018     Priority: High    Colonoscopy 03/2015 and 2021    Primary osteoarthritis of right shoulder 04/11/2023     Priority: Medium    Hepatic steatosis 07/28/2020     Priority: Medium    GERD (gastroesophageal reflux disease) 10/02/2018     Priority: Medium    Osteoporosis of forearm 10/02/2018     Priority: Medium     Dexa 05/2022 t = -3.7 at forearm, -2.1 at hip; offered fosamax. Significant decreases. dexa 11/3019  t = -1.5 lowest, stable osteopenia. Repeat in 2 years.  Dexa 2019: T = -1.4 lowest; recheck 2 years.     Primary osteoarthritis involving multiple joints 10/02/2018     Priority: Medium     Daily celebrex; bilateral knees, h/o right meniscal repair; has had viscosupplementation and steroid injections in the past.  Bilateral hands, 2024: xrays lumbar spine with multilevel DJD and bilateral hips with  mild OA changes    Gastric polyps 10/02/2018     Priority: Medium     EGD 03/2015; benign; done for dysphagia    Trochanteric bursitis of both hips 04/11/2023    Atrial tachycardia (HCC) 02/09/2023    Osteopenia 09/19/2022    Genetic testing 08/30/2021     Negative hereditary cancer genetic testing: no pathogenic variants detected in [facility] BRCAPlus Panel and CancerNext-Expanded +RNAinsight Panel.  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  The report dates are 08/27/2021 and 08/30/2021.   The BRCAplus panel offered by [employer] and includes sequencing and deletion/duplication analysis for the following 8 genes: ATM, BRCA1, BRCA2, CDH1, CHEK2, PALB2, PTEN, and TP53.  The CancerNext-Expanded gene panel offered by [facility] and includes sequencing, rearrangement, and RNA analysis for the following 77 genes: AIP, ALK, APC, ATM, AXIN2, BAP1, BARD1, BLM, BMPR1A, BRCA1, BRCA2, BRIP1, CDC73, CDH1, CDK4, CDKN1B, CDKN2A, CHEK2, CTNNA1, DICER1, FANCC, FH, FLCN, GALNT12, KIF1B, LZTR1, MAX, MEN1, MET, MLH1, MSH2, MSH3, MSH6, MUTYH, NBN, NF1, NF2, NTHL1, PALB2, PHOX2B, PMS2, POT1, PRKAR1A, PTCH1, PTEN, RAD51C, RAD51D, RB1, RECQL, RET, SDHA, SDHAF2, SDHB, SDHC, SDHD, SMAD4, SMARCA4, SMARCB1, SMARCE1, STK11, SUFU, TMEM127, TP53, TSC1, TSC2, VHL and XRCC2 (sequencing and deletion/duplication); EGFR, EGLN1, HOXB13, KIT, MITF, PDGFRA, POLD1, and POLE (sequencing only); EPCAM and GREM1 (deletion/duplication only).      Family history of breast cancer 08/19/2021    Family history of pancreatic cancer 08/19/2021    Health Maintenance  Topic Date Due   Zoster Vaccines- Shingrix (2 of 2) 08/19/2020   COVID-19 Vaccine (4 - 2023-24 season) 07/30/2023 (Originally 05/14/2023)   MAMMOGRAM  01/31/2024   Medicare Annual Wellness (AWV)  03/20/2024   DEXA SCAN  05/18/2024   Colonoscopy  08/14/2025   Pneumonia Vaccine 70+ Years old  Completed   INFLUENZA VACCINE  Completed    Hepatitis C Screening  Completed   HPV VACCINES  Aged Out   DTaP/Tdap/Td  Discontinued   Immunization History  Administered Date(s) Administered   Fluad Quad(high Dose 65+) 07/16/2019, 07/09/2021   Fluad Trivalent(High Dose 65+) 07/14/2023   Influenza, High Dose Seasonal PF 06/04/2020   PFIZER(Purple Top)SARS-COV-2 Vaccination 09/27/2019, 10/18/2019, 07/03/2020   Pneumococcal Conjugate-13 07/18/2013   Pneumococcal Polysaccharide-23 09/11/2009   Zoster Recombinant(Shingrix) 06/24/2020   We updated and reviewed the patient's past history in detail and it is documented below. Allergies: Patient is allergic to morphine and morphine and codeine. Past Medical History Patient  has a past medical history of Allergy (1990), Arthritis, Atypical mole (02/24/2015), Atypical mole (01/20/2022), Atypical mole (01/20/2022), Atypical nevi (02/24/2015), Atypical nevi (08/25/2015), Atypical nevi (08/25/2015), Atypical nevi (02/23/2016), Atypical nevi (02/23/2016), Atypical nevi (02/23/2016), Atypical nevi (08/23/2016), Atypical nevi (07/25/2018), Atypical nevi (01/22/2019), Atypical nevi (01/22/2019), Atypical nevi (08/06/2019), Atypical nevi (08/06/2019), Blood transfusion without reported diagnosis, Breast cancer (HCC) (2022), CAD (coronary artery disease), native coronary artery (02/07/2019), Family history of breast cancer (08/19/2021), Family history of pancreatic cancer (08/19/2021), Fatty liver (05/21/2020), Gastric polyps (10/02/2018), GERD (gastroesophageal reflux disease) (10/02/2018), History of melanoma (10/02/2018), Hyperlipidemia, Hypertension, Melanoma (HCC) (2012), Osteopenia,  Personal history of radiation therapy, Polyp of colon, adenomatous (10/02/2018), PONV (postoperative nausea and vomiting), Pulmonary hypertension, primary (HCC) (10/02/2018), SCCA (squamous cell carcinoma) of skin (09/18/2017), Sleep apnea, Sleep apnea (05/26/2020), and Tricuspid regurgitation. Past Surgical History Patient   has a past surgical history that includes Meniscus repair (Right, 2001); Appendectomy (1962); Abdominal hysterectomy (1990); Bladder suspension (1990); thumb surgery (Left); Colonoscopy (2016); tuabl ligation; Tonsillectomy and adenoidectomy; Wisdom tooth extraction; Eye surgery (2013 torn retina laser); Tubal ligation (1975); Breast lumpectomy with radioactive seed localization (Left, 09/09/2021); and RIGHT HEART CATH AND CORONARY ANGIOGRAPHY (N/A, 06/20/2023). Family History: Patient family history includes Arthritis in her mother, paternal grandmother, and sister; Asthma in her maternal grandfather and paternal grandfather; Breast cancer (age of onset: 8) in her sister; COPD in her paternal grandfather; Diabetes in her sister; Early death in her maternal grandmother; Heart attack in her father; Heart disease in her father, maternal uncle, maternal uncle, and paternal grandmother; High Cholesterol in her sister; High blood pressure in her father, maternal grandmother, mother, paternal grandmother, and sister; Hyperlipidemia in her mother; Hypertension in her father, maternal grandmother, mother, and paternal grandmother; Kidney disease in her maternal grandfather and paternal grandfather; Pancreatic cancer in her maternal aunt; Prostate cancer in her cousin; Stroke in her maternal grandmother, mother, and paternal aunt; Varicose Veins in her mother and sister. Social History:  Patient  reports that she quit smoking about 26 years ago. Her smoking use included cigarettes. She started smoking about 36 years ago. She has a 2.5 pack-year smoking history. She has never used smokeless tobacco. She reports current alcohol use of about 10.0 standard drinks of alcohol per week. She reports that she does not use drugs.  Review of Systems: Constitutional: negative for fever or malaise Ophthalmic: negative for photophobia, double vision or loss of vision Cardiovascular: negative for chest pain, dyspnea on exertion,  or new LE swelling + palpitations Respiratory: + for SOB or persistent cough Gastrointestinal: negative for abdominal pain, change in bowel habits or melena Genitourinary: negative for dysuria or gross hematuria, no abnormal uterine bleeding or disharge Musculoskeletal: negative for new gait disturbance or muscular weakness Integumentary: negative for new or persistent rashes, no breast lumps Neurological: negative for TIA or stroke symptoms Psychiatric: negative for SI or delusions Allergic/Immunologic: negative for hives  Patient Care Team    Relationship Specialty Notifications Start End  Willow Ora, MD PCP - General Family Medicine  10/02/18   Quintella Reichert, MD PCP - Cardiology Cardiology  05/10/21   Quintella Reichert, MD Consulting Physician Cardiology  02/13/19   Sherrie George, MD Consulting Physician Ophthalmology  08/02/19   Glyn Ade, PA-C Physician Assistant Dermatology  08/02/19   Manus Rudd, MD Consulting Physician General Surgery  08/13/21   Malachy Mood, MD Consulting Physician Hematology  08/13/21   Lonie Peak, MD Attending Physician Radiation Oncology  08/13/21   Erroll Luna, [facility] (Inactive)  Pharmacist  08/24/21    Comment: [phone]    Objective  Vitals: BP (!) 136/58   Pulse 71   Temp 98.6 F (37 C)   Ht 5\' 3"  (1.6 m)   Wt 151 lb 9.6 oz (68.8 kg)   SpO2 97%   BMI 26.85 kg/m  General:  Well developed, well nourished, no acute distress, looks good Psych:  Alert and orientedx3,normal mood and affect HEENT:  Normocephalic, atraumatic, non-icteric sclera,  supple neck without adenopathy, mass or thyromegaly Cardiovascular:  Normal S1, S2, RRR without gallop, rub or murmur  Respiratory:  Good breath sounds bilaterally, CTAB with normal respiratory effort Extremities: Trace edema bilaterally Neurologic:    Mental status is normal.  Gross motor and sensory exams are normal.  No tremor  No visits with results within 1 Day(s) from this visit.   Latest known visit with results is:  Appointment on 07/13/2023  Component Date Value Ref Range Status   Sodium 07/13/2023 139  135 - 145 mmol/L Final   Potassium 07/13/2023 3.7  3.5 - 5.1 mmol/L Final   Chloride 07/13/2023 104  98 - 111 mmol/L Final   CO2 07/13/2023 27  22 - 32 mmol/L Final   Glucose, Bld 07/13/2023 109 (H)  70 - 99 mg/dL Final   BUN 16/06/9603 19  8 - 23 mg/dL Final   Creatinine, Ser 07/13/2023 1.01 (H)  0.44 - 1.00 mg/dL Final   Calcium 54/05/8118 9.9  8.9 - 10.3 mg/dL Final   Total Protein 14/78/2956 6.8  6.5 - 8.1 g/dL Final   Albumin 21/30/8657 4.2  3.5 - 5.0 g/dL Final   AST 84/69/6295 29  15 - 41 U/L Final   ALT 07/13/2023 28  0 - 44 U/L Final   Alkaline Phosphatase 07/13/2023 80  38 - 126 U/L Final   Total Bilirubin 07/13/2023 1.0  0.3 - 1.2 mg/dL Final   GFR, Estimated 07/13/2023 57 (L)  >60 mL/min Final   Anion gap 07/13/2023 8  5 - 15 Final   WBC 07/13/2023 5.0  4.0 - 10.5 K/uL Final   RBC 07/13/2023 3.30 (L)  3.87 - 5.11 MIL/uL Final   Hemoglobin 07/13/2023 10.9 (L)  12.0 - 15.0 g/dL Final   HCT 28/41/3244 31.5 (L)  36.0 - 46.0 % Final   MCV 07/13/2023 95.5  80.0 - 100.0 fL Final   MCH 07/13/2023 33.0  26.0 - 34.0 pg Final   MCHC 07/13/2023 34.6  30.0 - 36.0 g/dL Final   RDW 09/14/7251 17.3 (H)  11.5 - 15.5 % Final   Platelets 07/13/2023 191  150 - 400 K/uL Final   nRBC 07/13/2023 0.8 (H)  0.0 - 0.2 % Final   Neutrophils Relative % 07/13/2023 66  % Final   Neutro Abs 07/13/2023 3.4  1.7 - 7.7 K/uL Final   Lymphocytes Relative 07/13/2023 22  % Final   Lymphs Abs 07/13/2023 1.1  0.7 - 4.0 K/uL Final   Monocytes Relative 07/13/2023 8  % Final   Monocytes Absolute 07/13/2023 0.4  0.1 - 1.0 K/uL Final   Eosinophils Relative 07/13/2023 2  % Final   Eosinophils Absolute 07/13/2023 0.1  0.0 - 0.5 K/uL Final   Basophils Relative 07/13/2023 1  % Final   Basophils Absolute 07/13/2023 0.0  0.0 - 0.1 K/uL Final   Immature Granulocytes 07/13/2023 1  % Final    Abs Immature Granulocytes 07/13/2023 0.05  0.00 - 0.07 K/uL Final    Commons side effects, risks, benefits, and alternatives for medications and treatment plan prescribed today were discussed, and the patient expressed understanding of the given instructions. Patient is instructed to call or message via MyChart if he/she has any questions or concerns regarding our treatment plan. No barriers to understanding were identified. We discussed Red Flag symptoms and signs in detail. Patient expressed understanding regarding what to do in case of urgent or emergency type symptoms.  Medication list was reconciled, printed and provided to the patient in AVS. Patient instructions and summary information was reviewed with the patient as documented in the AVS. This note was  prepared with assistance of Conservation officer, historic buildings. Occasional wrong-word or sound-a-like substitutions may have occurred due to the inherent limitations of voice recognition software

## 2023-07-17 ENCOUNTER — Telehealth: Payer: Self-pay | Admitting: Hematology

## 2023-07-20 ENCOUNTER — Inpatient Hospital Stay: Payer: Medicare Other

## 2023-07-20 ENCOUNTER — Inpatient Hospital Stay: Payer: Medicare Other | Attending: Hematology

## 2023-07-20 ENCOUNTER — Other Ambulatory Visit: Payer: Medicare Other

## 2023-07-20 DIAGNOSIS — E538 Deficiency of other specified B group vitamins: Secondary | ICD-10-CM | POA: Diagnosis not present

## 2023-07-20 DIAGNOSIS — M8580 Other specified disorders of bone density and structure, unspecified site: Secondary | ICD-10-CM

## 2023-07-20 DIAGNOSIS — D649 Anemia, unspecified: Secondary | ICD-10-CM | POA: Diagnosis not present

## 2023-07-20 DIAGNOSIS — Z853 Personal history of malignant neoplasm of breast: Secondary | ICD-10-CM | POA: Insufficient documentation

## 2023-07-20 DIAGNOSIS — D519 Vitamin B12 deficiency anemia, unspecified: Secondary | ICD-10-CM

## 2023-07-20 LAB — CBC WITH DIFFERENTIAL/PLATELET
Abs Immature Granulocytes: 0.05 10*3/uL (ref 0.00–0.07)
Basophils Absolute: 0 10*3/uL (ref 0.0–0.1)
Basophils Relative: 1 %
Eosinophils Absolute: 0.1 10*3/uL (ref 0.0–0.5)
Eosinophils Relative: 1 %
HCT: 32.6 % — ABNORMAL LOW (ref 36.0–46.0)
Hemoglobin: 11.2 g/dL — ABNORMAL LOW (ref 12.0–15.0)
Immature Granulocytes: 1 %
Lymphocytes Relative: 25 %
Lymphs Abs: 1.4 10*3/uL (ref 0.7–4.0)
MCH: 33.1 pg (ref 26.0–34.0)
MCHC: 34.4 g/dL (ref 30.0–36.0)
MCV: 96.4 fL (ref 80.0–100.0)
Monocytes Absolute: 0.4 10*3/uL (ref 0.1–1.0)
Monocytes Relative: 8 %
Neutro Abs: 3.5 10*3/uL (ref 1.7–7.7)
Neutrophils Relative %: 64 %
Platelets: 234 10*3/uL (ref 150–400)
RBC: 3.38 MIL/uL — ABNORMAL LOW (ref 3.87–5.11)
RDW: 17.1 % — ABNORMAL HIGH (ref 11.5–15.5)
WBC: 5.5 10*3/uL (ref 4.0–10.5)
nRBC: 0.4 % — ABNORMAL HIGH (ref 0.0–0.2)

## 2023-07-20 MED ORDER — CYANOCOBALAMIN 1000 MCG/ML IJ SOLN
1000.0000 ug | Freq: Once | INTRAMUSCULAR | Status: AC
  Administered 2023-07-20: 1000 ug via INTRAMUSCULAR

## 2023-07-24 NOTE — Progress Notes (Unsigned)
Cardiology Office Note:  .   Date:  07/25/2023  ID:  [name], DOB 05-07-1946, MRN [id number] PCP: Willow Ora, MD  [facility] Providers Cardiologist:  Armanda Magic, MD    Patient Profile: .      PMH Pulmonary hypertension Likely combination of WHO group 2 pulmonary venous hypertension from diastolic dysfunction and Group 3 from OSA Coronary artery disease Mild CAD with calcified vessels on cath 2016 Coronary CTA 07/19/2022 CAC score 344 (76th percentile) LAD 25-49% calcified plaque prox 50-69% calcified plaque mid vessel at D1 take off 25-49% plaque distally D1 1-24% calcified plaque proximally LCx 1-24% mixed plaque prox/mid RCA 1-25% calcified plaque prox, 1-24% mixed plaque mid, 1-24% calcified plaque distal R/LHC 06/20/2023 Prox RCA to Mid RCA 20% Mid Cx 30% Ost LAD to mLAD 40% 1st diag 60% Normal filling pressures, normal PAP Hypertension Hyperlipidemia Estrogen receptor positive breast CA OSA on CPAP Chronic HFpEF SVT  Echocardiogram in 2018 in [place] showed normal LV function EF 55-60, G2 DD, mild to moderate TR and mild pulmonary hypertension with RVSP 39 mmHg.  Repeat 2D echo 1 year later was unchanged.  Nuclear stress test 2015 showed no ischemia.  Cardiac catheterization 2016 showed mild CAD with calcified vessels.  Home sleep study 07/2021 showed moderate OSA with AHI 18/H and O2 sats as low as 72%.  She was referred to ENT for consideration of inspire device and was scheduled for sleep induced endoscopy, unfortunately was diagnosed with breast cancer in the interim and endoscopy was postponed.  She underwent lumpectomy on 09/09/2021.  Echocardiogram 03/2022 showed normalization of PA pressures with treatment of sleep apnea on CPAP, normal LVEF.  Cardiac monitor for palpitations revealed multiple episodes of atrial tachycardia with longest lasting 6 minutes 2 seconds.  She was advised to continue Cardizem and start Toprol-XL 25 mg  daily. Was referred to EP with plan to continue medical therapy. She later reported sleep disturbance with metoprolol and it was discontinued. Cardizem dose was increased.   Last cardiology clinic visit was 05/18/23 with Dr. Mayford Knife.  She was tolerating PAP therapy well. She reported chronic DOE walking up stairs and other short distances which was very frustrating to her.  She was also having palpitations coincided with shortness of breath.  Dr. Mayford Knife recommended that she proceed with right and left heart cath to assess worsening DOE.  Cardiac cath 06/20/2023 revealed nonobstructive CAD, normal filling pressures, and normal pulmonary artery pressure. Cardiac monitor revealed predominant NSR with average HR 69 bpm, NSVT lasting 7 beats, multiple episodes of SVT. She was advised to discontinue lisinopril and increase Cardizem to 300 mg daily.        History of Present Illness: .   Madison Clements is a very pleasant 77 y.o. female who is here today for follow-up and is accompanied by her sister-in-law. She reports persistent shortness of breath, often associated with palpitations. Reports feeling winded after minimal physical exertion, such as walking half a block or vacuuming. Heart rates range from 40 to 133 beats per minute.  No chest pain, orthopnea, PND, presyncope, syncope.  She did not tolerate metoprolol, describing a feeling of being filled with cement and being unable to function. Since increasing diltiazem and stopping lisinopril, her blood pressure has been fluctuating, with recent readings ranging from 124/64 to 152/84. Reports other family members, including her father who had CABG, did not tolerate beta blockers. Noted increased pedal edema when Lasix was decreased prior to cath. No improvement  in symptoms since increasing diltiazem from 240 mg daily to 300 mg daily. We reviewed echo, cath, and CPX results in detail and questions were answered to her satisfaction.   Discussed the use of AI  scribe software for clinical note transcription with the patient, who gave verbal consent to proceed.   ROS: See HPI       Studies Reviewed: .        Risk Assessment/Calculations:             Physical Exam:   VS:  BP 130/60 (BP Location: Left Arm, Patient Position: Sitting, Cuff Size: Normal)   Pulse 62   Ht 5\' 3"  (1.6 m)   Wt 149 lb (67.6 kg)   SpO2 98%   BMI 26.39 kg/m    Wt Readings from Last 3 Encounters:  07/25/23 149 lb (67.6 kg)  07/14/23 151 lb 9.6 oz (68.8 kg)  06/20/23 148 lb (67.1 kg)    GEN: Well nourished, well developed in no acute distress NECK: No JVD; No carotid bruits CARDIAC: RRR, no murmurs, rubs, gallops RESPIRATORY:  Clear to auscultation without rales, wheezing or rhonchi  ABDOMEN: Soft, non-tender, non-distended EXTREMITIES:  No edema; No deformity     ASSESSMENT AND PLAN: .    DOE/Pulmonary hypertension: Continues to have significant DOE. No orthopnea or PND.  Appears euvolemic on exam.  Has increased pedal edema on lower dose of diuretic. Eats a low sodium diet. OSA well controlled on CPAP.  Right and left heart catheterization 06/20/2023 revealed normal filling pressures. Echo 05/11/2023 revealed normal EF 60 to 65%, read out as severe leakiness of tricuspid valve with normal RV and RA size.  Images reviewed by Dr. Mayford Knife, primary cardiologist, who feels her TR is moderate with plan to repeat echo in 6 months. CPX 02/15/23 reealed normal functional capacity with low ventilatory threshold suggestive of exercise-training effect possibly limited by body habitus. Plan discussed with Dr. Tenny Craw, DOD. Deconditioning likely 2/2 to long-standing symptom of DOE.  We will continue Lasix 40 mg daily.  We are adding amlodipine 2.5 mg to see if this will provide her some antianginal benefit.  Will ask Dr. Mayford Knife to consider referral to pulmonary rehab.  If she is not a candidate for pulmonary rehab, recommended she hire a Systems analyst and to reach out to [place] if she needs  assistance with finding someone. Continue low sodium diet. Consider addition of SGLT2i at next office visit.   Palpitations/SVT: Multiple episodes of SVT noted on monitoring, with the longest lasting about one minute. She reports palpitations and fatigue. Current treatment with Diltiazem 300 mg not providing complete symptom relief. Consideration given to trying a different beta blocker such as Nebivolol, but she has family history of intolerance to beta blockers. We will add amlodipine 2.5 mg daily. Continue monitoring symptoms and heart rate.  CAD: Nonobstructive CAD on [facility] 06/20/23. She is having DOE but no chest pain. We are adding amlodipine 2.5 mg daily in hopes that she will have less DOE and be able to increase physical activity. I will see her back in soon follow-up for further medication titration.  She has some slight bruising since starting aspirin. I have asked her to continue aspirin 81 mg daily and notify us if bruising worsens.  Continue rosuvastatin.  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 She does not appear volume overloaded on exam. Normal right heart filling pressures on cath 06/20/23. Plan to repeat echo in 6 months.  Symptoms of DOE and palpitations have been present for several months. We will plan for repeat echo early 2025 unless clinically indicated prior.   OSA: She reports compliance with PAP.  No acute concerns.  Hypertension: BP is well controlled in clinic but home readings have increased since discontinuation of lisinopril.  We will have her start amlodipine 2.5 mg daily.  Renal function stable on 07/13/2023.  Will continue Lasix 40 mg daily and diltiazem 300 mg daily. Continue to monitor BP and report concerns.   Hyperlipidemia LDL goal < 70: LDL 64 on 03/08/23, at goal. Continue rosuvastatin.       Dispo: 4 weeks with me  Signed, Eligha Bridegroom, NP-C

## 2023-07-25 ENCOUNTER — Encounter: Payer: Self-pay | Admitting: Nurse Practitioner

## 2023-07-25 ENCOUNTER — Ambulatory Visit: Payer: Medicare Other | Attending: Nurse Practitioner | Admitting: Nurse Practitioner

## 2023-07-25 VITALS — BP 130/60 | HR 62 | Ht 63.0 in | Wt 149.0 lb

## 2023-07-25 DIAGNOSIS — G4733 Obstructive sleep apnea (adult) (pediatric): Secondary | ICD-10-CM | POA: Diagnosis not present

## 2023-07-25 DIAGNOSIS — I270 Primary pulmonary hypertension: Secondary | ICD-10-CM | POA: Diagnosis not present

## 2023-07-25 DIAGNOSIS — R0609 Other forms of dyspnea: Secondary | ICD-10-CM | POA: Diagnosis not present

## 2023-07-25 DIAGNOSIS — I361 Nonrheumatic tricuspid (valve) insufficiency: Secondary | ICD-10-CM

## 2023-07-25 DIAGNOSIS — I471 Supraventricular tachycardia, unspecified: Secondary | ICD-10-CM | POA: Diagnosis not present

## 2023-07-25 DIAGNOSIS — R002 Palpitations: Secondary | ICD-10-CM

## 2023-07-25 DIAGNOSIS — I2510 Atherosclerotic heart disease of native coronary artery without angina pectoris: Secondary | ICD-10-CM

## 2023-07-25 DIAGNOSIS — E785 Hyperlipidemia, unspecified: Secondary | ICD-10-CM

## 2023-07-25 MED ORDER — AMLODIPINE BESYLATE 2.5 MG PO TABS: 2.5000 mg | ORAL_TABLET | Freq: Every day | ORAL | 3 refills | Status: DC

## 2023-07-25 NOTE — Patient Instructions (Addendum)
Medication Instructions:   START nebivolol 5 mg once daily - can take at bedtime to avoid daytime fatigue   *If you need a refill on your cardiac medications before your next appointment, please call your pharmacy*   Lab Work:  None ordered.  If you have labs (blood work) drawn today and your tests are completely normal, you will receive your results only by: MyChart Message (if you have MyChart) OR A paper copy in the mail If you have any lab test that is abnormal or we need to change your treatment, we will call you to review the results.   Testing/Procedures:  None ordered.   Follow-Up: At [facility], you and your health needs are our priority.  As part of our continuing mission to provide you with exceptional heart care, we have created designated Provider Care Teams.  These Care Teams include your primary Cardiologist (physician) and Advanced Practice Providers (APPs -  Physician Assistants and Nurse Practitioners) who all work together to provide you with the care you need, when you need it.  We recommend signing up for the patient portal called "MyChart".  Sign up information is provided on this After Visit Summary.  MyChart is used to connect with patients for Virtual Visits (Telemedicine).  Patients are able to view lab/test results, encounter notes, upcoming appointments, etc.  Non-urgent messages can be sent to your provider as well.   To learn more about what you can do with MyChart, go to [email or web address].    Your next appointment:   5 week(s)  Provider:   Eligha Bridegroom, NP         Other Instructions

## 2023-07-26 MED ORDER — NEBIVOLOL HCL 5 MG PO TABS: 5.0000 mg | ORAL_TABLET | Freq: Every day | ORAL | 3 refills | Status: DC

## 2023-07-26 NOTE — Addendum Note (Signed)
Addended by: Levi Aland on: 07/26/2023 09:42 AM   Modules accepted: Orders

## 2023-08-03 ENCOUNTER — Other Ambulatory Visit: Payer: Self-pay | Admitting: Nurse Practitioner

## 2023-08-03 DIAGNOSIS — R6889 Other general symptoms and signs: Secondary | ICD-10-CM

## 2023-08-03 DIAGNOSIS — I272 Pulmonary hypertension, unspecified: Secondary | ICD-10-CM

## 2023-08-07 ENCOUNTER — Ambulatory Visit (HOSPITAL_COMMUNITY): Admit: 2023-08-07 | Payer: Medicare Other | Admitting: Orthopedic Surgery

## 2023-08-07 SURGERY — ARTHROPLASTY, KNEE, TOTAL
Anesthesia: Choice | Site: Knee | Laterality: Right

## 2023-08-08 ENCOUNTER — Ambulatory Visit: Payer: Medicare Other | Admitting: Nurse Practitioner

## 2023-08-16 ENCOUNTER — Inpatient Hospital Stay: Payer: Medicare Other | Attending: Hematology

## 2023-08-16 ENCOUNTER — Other Ambulatory Visit: Payer: Self-pay

## 2023-08-16 ENCOUNTER — Telehealth: Payer: Self-pay | Admitting: Hematology

## 2023-08-16 ENCOUNTER — Inpatient Hospital Stay (HOSPITAL_BASED_OUTPATIENT_CLINIC_OR_DEPARTMENT_OTHER): Payer: Medicare Other | Admitting: Hematology

## 2023-08-16 ENCOUNTER — Inpatient Hospital Stay: Payer: Medicare Other

## 2023-08-16 VITALS — BP 135/52 | HR 71 | Temp 97.3°F | Resp 18 | Wt 153.6 lb

## 2023-08-16 DIAGNOSIS — E538 Deficiency of other specified B group vitamins: Secondary | ICD-10-CM | POA: Insufficient documentation

## 2023-08-16 DIAGNOSIS — Z170 Estrogen receptor positive status [ER+]: Secondary | ICD-10-CM

## 2023-08-16 DIAGNOSIS — D649 Anemia, unspecified: Secondary | ICD-10-CM

## 2023-08-16 DIAGNOSIS — M1990 Unspecified osteoarthritis, unspecified site: Secondary | ICD-10-CM | POA: Insufficient documentation

## 2023-08-16 DIAGNOSIS — Z853 Personal history of malignant neoplasm of breast: Secondary | ICD-10-CM | POA: Diagnosis not present

## 2023-08-16 DIAGNOSIS — Z90710 Acquired absence of both cervix and uterus: Secondary | ICD-10-CM | POA: Diagnosis not present

## 2023-08-16 DIAGNOSIS — C50812 Malignant neoplasm of overlapping sites of left female breast: Secondary | ICD-10-CM | POA: Diagnosis not present

## 2023-08-16 DIAGNOSIS — E2839 Other primary ovarian failure: Secondary | ICD-10-CM | POA: Diagnosis not present

## 2023-08-16 DIAGNOSIS — Z923 Personal history of irradiation: Secondary | ICD-10-CM | POA: Diagnosis not present

## 2023-08-16 DIAGNOSIS — M8580 Other specified disorders of bone density and structure, unspecified site: Secondary | ICD-10-CM

## 2023-08-16 DIAGNOSIS — Z1231 Encounter for screening mammogram for malignant neoplasm of breast: Secondary | ICD-10-CM

## 2023-08-16 DIAGNOSIS — D519 Vitamin B12 deficiency anemia, unspecified: Secondary | ICD-10-CM

## 2023-08-16 LAB — CBC WITH DIFFERENTIAL/PLATELET
Abs Immature Granulocytes: 0.04 10*3/uL (ref 0.00–0.07)
Basophils Absolute: 0 10*3/uL (ref 0.0–0.1)
Basophils Relative: 0 %
Eosinophils Absolute: 0 10*3/uL (ref 0.0–0.5)
Eosinophils Relative: 0 %
HCT: 33.8 % — ABNORMAL LOW (ref 36.0–46.0)
Hemoglobin: 11.6 g/dL — ABNORMAL LOW (ref 12.0–15.0)
Immature Granulocytes: 1 %
Lymphocytes Relative: 19 %
Lymphs Abs: 1.3 10*3/uL (ref 0.7–4.0)
MCH: 33 pg (ref 26.0–34.0)
MCHC: 34.3 g/dL (ref 30.0–36.0)
MCV: 96 fL (ref 80.0–100.0)
Monocytes Absolute: 0.7 10*3/uL (ref 0.1–1.0)
Monocytes Relative: 10 %
Neutro Abs: 4.9 10*3/uL (ref 1.7–7.7)
Neutrophils Relative %: 70 %
Platelets: 204 10*3/uL (ref 150–400)
RBC: 3.52 MIL/uL — ABNORMAL LOW (ref 3.87–5.11)
RDW: 14.7 % (ref 11.5–15.5)
WBC: 6.9 10*3/uL (ref 4.0–10.5)
nRBC: 0 % (ref 0.0–0.2)

## 2023-08-16 LAB — VITAMIN B12: Vitamin B-12: 443 pg/mL (ref 180–914)

## 2023-08-16 LAB — FERRITIN: Ferritin: 175 ng/mL (ref 11–307)

## 2023-08-16 MED ORDER — CYANOCOBALAMIN 1000 MCG/ML IJ SOLN
1000.0000 ug | Freq: Once | INTRAMUSCULAR | Status: AC
  Administered 2023-08-16: 1000 ug via INTRAMUSCULAR
  Filled 2023-08-16: qty 1

## 2023-08-16 NOTE — Assessment & Plan Note (Signed)
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 Biopsy 08/11/21 confirmed invasive mammary carcinoma. -left lumpectomy on 09/09/21 by Dr. 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 No nodes were removed due to her age. -she received adjuvant RT, completed in 11/2021 -she started adjuvant tamoxifen in 12/2021, but could not tolerate. She subsequently tried anastrozole and still can not tolerate. She stopped in 04/2022 -Screening mammogram in May 2024 were negative. -continue breast cancer surveillance

## 2023-08-16 NOTE — Progress Notes (Signed)
[facility]   [phone] Fax:([phone]) [phone]   Clinic Follow up Note   Patient Care Team: Willow Ora, MD as PCP - General (Family Medicine) Quintella Reichert, MD as PCP - Cardiology (Cardiology) Quintella Reichert, MD as Consulting Physician (Cardiology) Sherrie George, MD as Consulting Physician (Ophthalmology) Glyn Ade, PA-C as Physician Assistant (Dermatology) Manus Rudd, MD as Consulting Physician (General Surgery) Malachy Mood, MD as Consulting Physician (Hematology) Lonie Peak, MD as Attending Physician (Radiation Oncology) Erroll Luna, [facility] (Inactive) (Pharmacist)  Date of Service:  08/16/2023  CHIEF COMPLAINT: f/u of anemia and B12 deficiency   CURRENT THERAPY:  B12 injection monthly  Prenatal multivitamin  Oncology History   Malignant neoplasm of overlapping sites of left breast in female, estrogen receptor positive (HCC) Stage IA, pT2, cN0, ER+/PR+/HER2-, Grade 2, RS 21 -she was under short-term f/u for left breast asymmetry. Biopsy 08/11/21 confirmed invasive mammary carcinoma. -left lumpectomy on 09/09/21 by Dr. 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 No nodes were removed due to her age. -she received adjuvant RT, completed in 11/2021 -she started adjuvant tamoxifen in 12/2021, but could not tolerate. She subsequently tried anastrozole and still can not tolerate. She stopped in 04/2022 -Screening mammogram in May 2024 were negative. -continue breast cancer surveillance     Assessment and Plan    Breast Cancer -Continue cancer monitoring  Anemia and B12 deficiency Chronic anemia with improvement in hemoglobin levels from 9.2 in September to 11.6 today. Persistent fatigue despite taking prenatal vitamins and B12 injections. B12 levels were low previously, and B12 injections have been administered since August. Ferritin levels were high in August, possibly due to chronic inflammation or other causes.  Discussed the option of switching to oral B12 if levels improve, but patient prefers continuing with injections. - Continue B12 injections. If today's B12 level is still low, administer injections every two weeks. If normal, continue monthly injections. - Check ferritin levels again.  Osteoarthritis Osteoarthritis. No specific symptoms or treatment discussed in this visit.  General Health Maintenance Taking prenatal vitamins which include iron. No other general health maintenance issues discussed.     Plan -Lab reviewed, anemia improved -Continue B12 injection monthly if B12 level adequate today -Continue prenatal multivitamin daily -Next follow-up scheduled in February 2025    SUMMARY OF ONCOLOGIC HISTORY: Oncology History Overview Note   Cancer Staging  Malignant neoplasm of overlapping sites of left breast in female, estrogen receptor positive (HCC) Staging form: Breast, AJCC 8th Edition - Clinical stage from 08/11/2021: Stage IA (cT1c, cN0, cM0, G2, ER+, PR+, HER2-) - Signed by Malachy Mood, MD on 08/18/2021     Malignant neoplasm of overlapping sites of left breast in female, estrogen receptor positive (HCC)  07/27/2021 Mammogram   EXAM: DIGITAL DIAGNOSTIC UNILATERAL LEFT MAMMOGRAM WITH TOMOSYNTHESIS AND CAD; ULTRASOUND LEFT BREAST LIMITED  IMPRESSION: 1. There is a suspicious mass in the left breast at 9 o'clock measuring 1.8 cm.   2. There is a 1.0 cm group of suspicious linear calcifications anterior to the suspicious mass in the left breast, as well as a faint 2 mm group which lie 1 cm lateral to the linear calcifications.   3.  No evidence of left axillary lymphadenopathy   08/11/2021 Cancer Staging   Staging form: Breast, AJCC 8th Edition - Clinical stage from 08/11/2021: Stage IA (cT1c, cN0, cM0, G2, ER+, PR+, HER2-) - Signed by Malachy Mood, MD on 08/18/2021 Stage prefix: Initial diagnosis Histologic grading  system: 3 grade system   08/11/2021 Initial Biopsy    Diagnosis 1. Breast, left, needle core biopsy, 9 o'clock, ribbon clip - INVASIVE MAMMARY CARCINOMA - SEE COMMENT 2. Breast, left, needle core biopsy, lower inner quadrant, x clip - INVASIVE MAMMARY CARCINOMA - MAMMARY CARCINOMA IN-SITU - CALCIFICATIONS - SEE COMMENT Microscopic Comment 1. The biopsy material shows an infiltrative proliferation of cells with arranged linearly and in small clusters. Based on the biopsy, the carcinoma appears Nottingham grade 2 of 3 and measures 1.2 cm in greatest linear extent. 2. Based on the biopsy, the carcinoma appears Nottingham grade 2 of 3 and measures 0.2 cm in greatest linear extent.  1. PROGNOSTIC INDICATORS Results: The tumor cells are NEGATIVE for Her2 (1+). Estrogen Receptor: 100%, POSITIVE, STRONG STAINING INTENSITY Progesterone Receptor: 60%, POSITIVE, STRONG STAINING INTENSITY Proliferation Marker Ki67: 10%  2. PROGNOSTIC INDICATORS Results: The tumor cells are NEGATIVE for Her2 (1+). Estrogen Receptor: 100%, POSITIVE, STRONG STAINING INTENSITY Progesterone Receptor: 60%, POSITIVE, STRONG STAINING INTENSITY Proliferation Marker Ki67: 15%   08/13/2021 Mammogram   EXAM: DIGITAL DIAGNOSTIC UNILATERAL RIGHT MAMMOGRAM WITH TOMOSYNTHESIS AND CAD  IMPRESSION: No mammographic evidence for malignancy.   08/16/2021 Initial Diagnosis   Malignant neoplasm of overlapping sites of left breast in female, estrogen receptor positive (HCC)   08/25/2021 Imaging   EXAM: BILATERAL BREAST MRI WITH AND WITHOUT CONTRAST  IMPRESSION: 1. Biopsy-proven invasive ductal carcinoma measuring approximately 2.8 x 1.7 x 1.5 cm in the inner breast at middle depth, associated with the ribbon shaped tissue marking clip placed at the time of core needle biopsy. Enhancement extends approximately 1.4 cm posterior to the clip. 2. Approximate 1.8 cm post biopsy hematoma with associated rim enhancement at the site of the biopsy-proven invasive ductal carcinoma and  DCIS in the LOWER INNER QUADRANT at anterior depth associated with the X shaped tissue marking clip. 3. In combination, the overall enhancement spans approximately 4 cm. 4. No MRI evidence of malignancy involving the RIGHT breast. 5. No pathologic lymphadenopathy.   08/27/2021 Genetic Testing   egative hereditary cancer genetic testing: no pathogenic variants detected in [facility] BRCAPlus Panel and CancerNext-Expanded +RNAinsight Panel.  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  The report dates are 08/27/2021 and 08/30/2021.   The BRCAplus panel offered by [employer] and includes sequencing and deletion/duplication analysis for the following 8 genes: ATM, BRCA1, BRCA2, CDH1, CHEK2, PALB2, PTEN, and TP53.  The CancerNext-Expanded gene panel offered by [facility] and includes sequencing, rearrangement, and RNA analysis for the following 77 genes: AIP, ALK, APC, ATM, AXIN2, BAP1, BARD1, BLM, BMPR1A, BRCA1, BRCA2, BRIP1, CDC73, CDH1, CDK4, CDKN1B, CDKN2A, CHEK2, CTNNA1, DICER1, FANCC, FH, FLCN, GALNT12, KIF1B, LZTR1, MAX, MEN1, MET, MLH1, MSH2, MSH3, MSH6, MUTYH, NBN, NF1, NF2, NTHL1, PALB2, PHOX2B, PMS2, POT1, PRKAR1A, PTCH1, PTEN, RAD51C, RAD51D, RB1, RECQL, RET, SDHA, SDHAF2, SDHB, SDHC, SDHD, SMAD4, SMARCA4, SMARCB1, SMARCE1, STK11, SUFU, TMEM127, TP53, TSC1, TSC2, VHL and XRCC2 (sequencing and deletion/duplication); EGFR, EGLN1, HOXB13, KIT, MITF, PDGFRA, POLD1, and POLE (sequencing only); EPCAM and GREM1 (deletion/duplication only).    09/09/2021 Definitive Surgery   FINAL MICROSCOPIC DIAGNOSIS:   A. BREAST, LEFT, LUMPECTOMY:  -  Invasive ductal carcinoma, Nottingham grade 2 of 3, 3.5 cm  -  Ductal carcinoma in-situ, intermediate grade  -  Margins uninvolved by carcinoma (<0.1 cm; anterior)  -  Previous biopsy site changes present    09/09/2021 Oncotype testing   Oncotype DX was obtained on the final surgical sample and the  recurrence score of 21 predicts  a risk of recurrence outside the breast over the next 9 years of 7%, if the patient's only systemic therapy is an antiestrogen for 5 years.  It also predicts no benefit from chemotherapy.   10/20/2021 - 11/16/2021 Radiation Therapy   Site Technique Total Dose (Gy) Dose per Fx (Gy) Completed Fx Beam Energies  Breast, Left: Breast_L_axilla 3D 40.05/40.05 2.67 15/15 10X  Breast, Left: Breast_L_Bst 3D 10/10 2 5/5 6X, 10X     12/06/2021 - 04/2022 Anti-estrogen oral therapy   Tamoxifen x 5 years unable to tolerate, attempted anastrozole and had similar side effects.  Opted to forego adjuvant antiestrogen therapy      Discussed the use of AI scribe software for clinical note transcription with the patient, who gave verbal consent to proceed.  History of Present Illness   The patient, with a history of breast cancer, presents for a follow-up visit for anemia. She has been taking prenatal vitamins as recommended during her last visit three months ago, but has not noticed any significant changes in her symptoms. She continues to experience fatigue. The patient's hemoglobin levels have improved slightly from 9.2 to 11.6 over the past three months, but are still not back to normal.  In addition to the anemia, the patient has been receiving B12 injections due to a previously low B12 level. She has been receiving these injections since August, initially weekly, then monthly, and then back to weekly due to persistently low levels. Despite the injections, the patient has not noticed any significant changes in her symptoms.  The patient also mentions a high ferritin level detected in a test conducted in August. She expresses concern about this as she has not been taking any iron supplements, except for the iron present in the prenatal vitamins.         All other systems were reviewed with the patient and are negative.  MEDICAL HISTORY:  Past Medical History:  Diagnosis Date   Allergy 1990   Morphine  following surgery   Arthritis    generalized   Atypical mole 02/24/2015   RGHT LATERAL THIGH MODERATE   Atypical mole 01/20/2022   Left Breast (mild)   Atypical mole 01/20/2022   Mid Back (mild)   Atypical nevi 02/24/2015   RIGHT NECK MILD   Atypical nevi 08/25/2015   LEFT UPPER ARM MILD   Atypical nevi 08/25/2015   LEFT LATERAL FOREARM MILD/FREE   Atypical nevi 02/23/2016   LEFT THIGH MODERATE/FREE   Atypical nevi 02/23/2016   LEFT MEDIAL LEG MODERATE/FREE   Atypical nevi 02/23/2016   LEFT UPPER BACK MILD /FREE   Atypical nevi 08/23/2016   RIGHT ANT PROX THIGH MILD /FREE   Atypical nevi 07/25/2018   MID LOWER BACK MODERATE   Atypical nevi 01/22/2019   LEFT MID BACK MILD /FREE   Atypical nevi 01/22/2019   LEFT NECK MILD   Atypical nevi 08/06/2019   LEFT INNER BREAST MODERATE W/S   Atypical nevi 08/06/2019   LEFT OUTER SIDE MILD/FREE   Blood transfusion without reported diagnosis    hx of   Breast cancer (HCC) 2022   CAD (coronary artery disease), native coronary artery 02/07/2019   Minimal CAD at the time of cath in 2016 in [facility]   Family history of breast cancer 08/19/2021   Family history of pancreatic cancer 08/19/2021   Fatty liver 05/21/2020   Gastric polyps 10/02/2018   EGD 03/2015; benign   GERD (gastroesophageal reflux disease)  10/02/2018   on meds   History of melanoma 10/02/2018   Abdomen 2012; 2013; s/p local excisions.    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 DISTAL LOWER LEG TREATED BY DR. Leda Quail   Sleep apnea    Sleep apnea 05/26/2020   uses  CPAP   Tricuspid regurgitation    moderate by echo 04/2023    SURGICAL HISTORY: Past Surgical History:  Procedure Laterality Date   ABDOMINAL HYSTERECTOMY  1990   APPENDECTOMY  1962   BLADDER SUSPENSION  1990   BREAST LUMPECTOMY WITH RADIOACTIVE SEED LOCALIZATION Left 09/09/2021   Procedure: LEFT BREAST LUMPECTOMY WITH RADIOACTIVE SEED LOCALIZATION X2;  Surgeon: Manus Rudd, MD;  Location:  SURGERY CENTER;  Service: General;  Laterality: Left;   COLONOSCOPY  2016   in [place]-hx of polyps   EYE SURGERY  2013 torn retina laser   MENISCUS REPAIR Right 2001   right knee   RIGHT HEART CATH AND CORONARY ANGIOGRAPHY N/A 06/20/2023   Procedure: RIGHT HEART CATH AND CORONARY ANGIOGRAPHY;  Surgeon: Laurey Morale, MD;  Location[facility] INVASIVE CV LAB;  Service: Cardiovascular;  Laterality: N/A;   thumb surgery Left    TONSILLECTOMY AND ADENOIDECTOMY     tuabl ligation     TUBAL LIGATION  1975   WISDOM TOOTH EXTRACTION      I have reviewed the social history and family history with the patient and they are unchanged from previous note.  ALLERGIES:  is allergic to morphine and morphine and codeine.  MEDICATIONS:  Current Outpatient Medications  Medication Sig Dispense Refill   aspirin EC 81 MG tablet Take 1 tablet (81 mg total) by mouth daily. Swallow whole. 30 tablet 12   diltiazem (CARDIZEM CD) 300 MG 24 hr capsule Take 1 capsule (300 mg total) by mouth daily. 90 capsule 3   furosemide (LASIX) 40 MG tablet Take 40 mg by mouth daily.     nebivolol (BYSTOLIC) 5 MG tablet Take 1 tablet (5 mg total) by mouth daily. 30 tablet 3   Prenatal Vit-Fe Fumarate-FA (PRENATAL VITAMIN PO) Take by mouth daily. Prenatal w/folic acid po daily     Probiotic Product (ADVANCED PROBIOTIC PO) Take 1 capsule by mouth daily.     rosuvastatin (CRESTOR) 40 MG tablet Take 1 tablet (40 mg total) by mouth at bedtime. 90 tablet 3   No current facility-administered medications for this visit.    PHYSICAL  EXAMINATION: ECOG PERFORMANCE STATUS: 1 - Symptomatic but completely ambulatory  Vitals:   08/16/23 1252  BP: (!) 135/52  Pulse: 71  Resp: 18  Temp: (!) 97.3 F (36.3 C)  SpO2: 95%   Wt Readings from Last 3 Encounters:  08/16/23 153 lb 9.6 oz (69.7 kg)  07/25/23 149 lb (67.6 kg)  07/14/23 151 lb 9.6 oz (68.8 kg)     GENERAL:alert, no distress and comfortable SKIN: skin color, texture, turgor are normal, no rashes or significant lesions EYES: normal, Conjunctiva are pink and non-injected, sclera clear Musculoskeletal:no cyanosis of digits and no clubbing  NEURO: alert &  oriented x 3 with fluent speech, no focal motor/sensory deficits  LABORATORY DATA:  I have reviewed the data as listed    Latest Ref Rng & Units 08/16/2023   12:35 PM 07/20/2023    3:04 PM 07/13/2023   11:32 AM  CBC  WBC 4.0 - 10.5 K/uL 6.9  5.5  5.0   Hemoglobin 12.0 - 15.0 g/dL 69.6  29.5  28.4   Hematocrit 36.0 - 46.0 % 33.8  32.6  31.5   Platelets 150 - 400 K/uL 204  234  191         Latest Ref Rng & Units 07/13/2023   11:32 AM 06/20/2023    2:07 PM 06/20/2023    2:06 PM  CMP  Glucose 70 - 99 mg/dL 132     BUN 8 - 23 mg/dL 19     Creatinine 4.40 - 1.00 mg/dL 1.02     Sodium 725 - 366 mmol/L 139  139  139   Potassium 3.5 - 5.1 mmol/L 3.7  3.7  3.7   Chloride 98 - 111 mmol/L 104     CO2 22 - 32 mmol/L 27     Calcium 8.9 - 10.3 mg/dL 9.9     Total Protein 6.5 - 8.1 g/dL 6.8     Total Bilirubin 0.3 - 1.2 mg/dL 1.0     Alkaline Phos 38 - 126 U/L 80     AST 15 - 41 U/L 29     ALT 0 - 44 U/L 28         RADIOGRAPHIC STUDIES: I have personally reviewed the radiological images as listed and agreed with the findings in the report. No results found.    Orders Placed This Encounter  Procedures   MM Digital Screening    Standing Status:   Future    Standing Expiration Date:   08/15/2024    Order Specific Question:   Reason for Exam (SYMPTOM  OR DIAGNOSIS REQUIRED)    Answer:   screening     Order Specific Question:   Preferred imaging location?    Answer:   [facility]   DG Bone Density    Standing Status:   Future    Standing Expiration Date:   08/15/2024    Order Specific Question:   Reason for Exam (SYMPTOM  OR DIAGNOSIS REQUIRED)    Answer:   screening    Order Specific Question:   Preferred imaging location?    Answer:   [facility]   Ferritin    ADD TO LAB DRAW Today    Standing Status:   Future    Number of Occurrences:   1    Standing Expiration Date:   08/15/2024   All questions were answered. The patient knows to call the clinic with any problems, questions or concerns. No barriers to learning was detected. The total time spent in the appointment was 25 minutes.     Malachy Mood, MD 08/16/2023

## 2023-08-17 ENCOUNTER — Inpatient Hospital Stay: Payer: Medicare Other

## 2023-08-17 ENCOUNTER — Encounter (HOSPITAL_COMMUNITY): Payer: Self-pay

## 2023-08-17 ENCOUNTER — Inpatient Hospital Stay: Payer: Medicare Other | Admitting: Hematology

## 2023-08-17 ENCOUNTER — Telehealth (HOSPITAL_COMMUNITY): Payer: Self-pay

## 2023-08-17 NOTE — Telephone Encounter (Signed)
Attempted to call patient in regards to Pulmonary Rehab - LM on VM Mailed letter 

## 2023-08-18 ENCOUNTER — Telehealth: Payer: Self-pay | Admitting: Hematology

## 2023-08-19 ENCOUNTER — Encounter: Payer: Self-pay | Admitting: Hematology

## 2023-08-21 ENCOUNTER — Encounter (HOSPITAL_COMMUNITY): Payer: Self-pay

## 2023-08-23 ENCOUNTER — Encounter (HOSPITAL_COMMUNITY)
Admission: RE | Admit: 2023-08-23 | Discharge: 2023-08-23 | Disposition: A | Discharge status: Home / Self Care | Payer: Medicare Other | Source: Ambulatory Visit | Attending: Cardiology | Admitting: Cardiology

## 2023-08-23 ENCOUNTER — Encounter (HOSPITAL_COMMUNITY): Payer: Self-pay

## 2023-08-23 VITALS — BP 118/58 | HR 67 | Wt 153.0 lb

## 2023-08-23 DIAGNOSIS — I272 Pulmonary hypertension, unspecified: Secondary | ICD-10-CM | POA: Insufficient documentation

## 2023-08-23 NOTE — Progress Notes (Signed)
Pulmonary Individual Treatment Plan  Patient Details  Name: Madison Clements MRN: [id number] Date of Birth: [birth year] Referring Provider:   Doristine Devoid Pulmonary Rehab Walk Test from 08/23/2023 in [facility]  Referring Provider Turner       Initial Encounter Date:  Flowsheet Row Pulmonary Rehab Walk Test from 08/23/2023 in [facility]  Date 08/23/23       Visit Diagnosis: Pulmonary hypertension (HCC)  Patient's Home Medications on Admission:   Current Outpatient Medications:    aspirin EC 81 MG tablet, Take 1 tablet (81 mg total) by mouth daily. Swallow whole., Disp: 30 tablet, Rfl: 12   diltiazem (CARDIZEM CD) 300 MG 24 hr capsule, Take 1 capsule (300 mg total) by mouth daily., Disp: 90 capsule, Rfl: 3   furosemide (LASIX) 40 MG tablet, Take 40 mg by mouth daily., Disp: , Rfl:    lisinopril (ZESTRIL) 10 MG tablet, Take 10 mg by mouth daily., Disp: , Rfl:    Prenatal Vit-Fe Fumarate-FA (PRENATAL VITAMIN PO), Take by mouth daily. Prenatal w/folic acid po daily, Disp: , Rfl:    Probiotic Product (ADVANCED PROBIOTIC PO), Take 1 capsule by mouth daily., Disp: , Rfl:    rosuvastatin (CRESTOR) 40 MG tablet, Take 1 tablet (40 mg total) by mouth at bedtime., Disp: 90 tablet, Rfl: 3   nebivolol (BYSTOLIC) 5 MG tablet, Take 1 tablet (5 mg total) by mouth daily., Disp: 30 tablet, Rfl: 3  Past Medical History: Past Medical History:  Diagnosis Date   Allergy 1990   Morphine following surgery   Arthritis    generalized   Atypical mole 02/24/2015   RGHT LATERAL THIGH MODERATE   Atypical mole 01/20/2022   Left Breast (mild)   Atypical mole 01/20/2022   Mid Back (mild)   Atypical nevi 02/24/2015   RIGHT NECK MILD   Atypical nevi 08/25/2015   LEFT UPPER ARM MILD   Atypical nevi 08/25/2015   LEFT LATERAL FOREARM MILD/FREE   Atypical nevi 02/23/2016   LEFT  THIGH MODERATE/FREE   Atypical nevi 02/23/2016   LEFT MEDIAL LEG MODERATE/FREE   Atypical nevi 02/23/2016   LEFT UPPER BACK MILD /FREE   Atypical nevi 08/23/2016   RIGHT ANT PROX THIGH MILD /FREE   Atypical nevi 07/25/2018   MID LOWER BACK MODERATE   Atypical nevi 01/22/2019   LEFT MID BACK MILD /FREE   Atypical nevi 01/22/2019   LEFT NECK MILD   Atypical nevi 08/06/2019   LEFT INNER BREAST MODERATE W/S   Atypical nevi 08/06/2019   LEFT OUTER SIDE MILD/FREE   Blood transfusion without reported diagnosis    hx of   Breast cancer (HCC) 2022   CAD (coronary artery disease), native coronary artery 02/07/2019   Minimal CAD at the time of cath in 2016 in [facility]   Family history of breast cancer 08/19/2021   Family history of pancreatic cancer 08/19/2021   Fatty liver 05/21/2020   Gastric polyps 10/02/2018   EGD 03/2015; benign   GERD (gastroesophageal reflux disease) 10/02/2018   on meds   History of melanoma 10/02/2018   Abdomen 2012; 2013; s/p local excisions.    Hyperlipidemia    on meds   Hypertension    on meds   Melanoma (HCC) 2012   MM- central lower abdomen- (ECX) [facility]   Osteopenia    Personal history of radiation therapy  Polyp of colon, adenomatous 10/02/2018   Colonoscopy 03/2015; recheck in 2021   PONV (postoperative nausea and vomiting)    Pulmonary hypertension, primary (HCC) 10/02/2018   SCCA (squamous cell carcinoma) of skin 09/18/2017   RIGHT ANT. DISTAL LOWER LEG TREATED BY DR. Leda Quail   Sleep apnea    Sleep apnea 05/26/2020   uses CPAP   Tricuspid regurgitation    moderate by echo 04/2023    Tobacco Use: Social History   Tobacco Use  Smoking Status Former   Current packs/day: 0.00   Average packs/day: 0.3 packs/day for 10.0 years (2.5 ttl pk-yrs)   Types: Cigarettes   Start date: 09/12/1986   Quit date: 09/12/1996   Years since quitting: 26.9  Smokeless Tobacco Never    Labs: Review Flowsheet  More data  exists      Latest Ref Rng & Units 03/09/2021 07/09/2021 05/18/2022 03/08/2023 06/20/2023  Labs for ITP Cardiac and Pulmonary Rehab  Cholestrol 100 - 199 mg/dL 161  096  045  409  -  LDL (calc) 0 - 99 mg/dL 55  66  63  61  -  HDL-C >39 mg/dL 66  81.19  48  55  -  Trlycerides 0 - 149 mg/dL 147  829.5  621  308  -  Bicarbonate 20.0 - 28.0 mmol/L - - - - 21.6  21.9   TCO2 22 - 32 mmol/L - - - - 23  23   Acid-base deficit 0.0 - 2.0 mmol/L - - - - 3.0  3.0   O2 Saturation % - - - - 62  62     Details       Multiple values from one day are sorted in reverse-chronological order         Capillary Blood Glucose: No results found for: "GLUCAP"   Pulmonary Assessment Scores:  Pulmonary Assessment Scores     Row Name 08/23/23 1110         ADL UCSD   ADL Phase Entry     SOB Score total 53       CAT Score   CAT Score 17       mMRC Score   mMRC Score 4             UCSD: Self-administered rating of dyspnea associated with activities of daily living (ADLs) 6-point scale (0 = "not at all" to 5 = "maximal or unable to do because of breathlessness")  Scoring Scores range from 0 to 120.  Minimally important difference is 5 units  CAT: CAT can identify the health impairment of COPD patients and is better correlated with disease progression.  CAT has a scoring range of zero to 40. The CAT score is classified into four groups of low (less than 10), medium (10 - 20), high (21-30) and very high (31-40) based on the impact level of disease on health status. A CAT score over 10 suggests significant symptoms.  A worsening CAT score could be explained by an exacerbation, poor medication adherence, poor inhaler technique, or progression of COPD or comorbid conditions.  CAT MCID is 2 points  mMRC: mMRC (Modified Medical Research Council) Dyspnea Scale is used to assess the degree of baseline functional disability in patients of respiratory disease due to dyspnea. No minimal important  difference is established. A decrease in score of 1 point or greater is considered a positive change.   Pulmonary Function Assessment:  Pulmonary Function Assessment - 08/23/23 1040  Breath   Bilateral Breath Sounds Clear    Shortness of Breath Yes;Limiting activity             Exercise Target Goals: Exercise Program Goal: Individual exercise prescription set using results from initial 6 min walk test and THRR while considering  patient's activity barriers and safety.   Exercise Prescription Goal: Initial exercise prescription builds to 30-45 minutes a day of aerobic activity, 2-3 days per week.  Home exercise guidelines will be given to patient during program as part of exercise prescription that the participant will acknowledge.  Activity Barriers & Risk Stratification:  Activity Barriers & Cardiac Risk Stratification - 08/23/23 1038       Activity Barriers & Cardiac Risk Stratification   Activity Barriers Arthritis;Deconditioning;Muscular Weakness;Shortness of Breath   needs right knee replacement   Cardiac Risk Stratification Moderate             6 Minute Walk:  6 Minute Walk     Row Name 08/23/23 1114         6 Minute Walk   Phase Initial     Distance 1129 feet     Walk Time 6 minutes     # of Rest Breaks 0     MPH 2.14     METS 2.12     RPE 13     Perceived Dyspnea  1     VO2 Peak 7.42     Symptoms No     Resting HR 67 bpm     Resting BP 118/58     Resting Oxygen Saturation  97 %     Exercise Oxygen Saturation  during 6 min walk 95 %     Max Ex. HR 94 bpm     Max Ex. BP 158/56     2 Minute Post BP 124/58       Interval HR   1 Minute HR 83     2 Minute HR 91     3 Minute HR 94     4 Minute HR 93     5 Minute HR 94     6 Minute HR 91     2 Minute Post HR 75     Interval Heart Rate? Yes       Interval Oxygen   Interval Oxygen? Yes     Baseline Oxygen Saturation % 97 %     1 Minute Oxygen Saturation % 96 %     1 Minute Liters of  Oxygen 0 L     2 Minute Oxygen Saturation % 96 %     2 Minute Liters of Oxygen 0 L     3 Minute Oxygen Saturation % 96 %     3 Minute Liters of Oxygen 0 L     4 Minute Oxygen Saturation % 95 %     4 Minute Liters of Oxygen 0 L     5 Minute Oxygen Saturation % 95 %     5 Minute Liters of Oxygen 0 L     6 Minute Oxygen Saturation % 95 %     6 Minute Liters of Oxygen 0 L     2 Minute Post Oxygen Saturation % 98 %     2 Minute Post Liters of Oxygen 0 L              Oxygen Initial Assessment:  Oxygen Initial Assessment - 08/23/23 1039       Home Oxygen   Home  Oxygen Device None    Sleep Oxygen Prescription CPAP    Home Exercise Oxygen Prescription None    Home Resting Oxygen Prescription None      Initial 6 min Walk   Oxygen Used None      Program Oxygen Prescription   Program Oxygen Prescription None      Intervention   Short Term Goals To learn and understand importance of monitoring SPO2 with pulse oximeter and demonstrate accurate use of the pulse oximeter.;To learn and understand importance of maintaining oxygen saturations>88%;To learn and demonstrate proper pursed lip breathing techniques or other breathing techniques.     Long  Term Goals Maintenance of O2 saturations>88%;Verbalizes importance of monitoring SPO2 with pulse oximeter and return demonstration;Exhibits proper breathing techniques, such as pursed lip breathing or other method taught during program session             Oxygen Re-Evaluation:   Oxygen Discharge (Final Oxygen Re-Evaluation):   Initial Exercise Prescription:  Initial Exercise Prescription - 08/23/23 1100       Date of Initial Exercise RX and Referring Provider   Date 08/23/23    Referring Provider Turner    Expected Discharge Date 11/16/23      NuStep   Level 1    SPM 60    Minutes 15    METs 2      Recumbant Elliptical   Level 1    RPM 40    Watts 20    Minutes 15    METs 2      Prescription Details   Frequency  (times per week) 2    Duration Progress to 30 minutes of continuous aerobic without signs/symptoms of physical distress      Intensity   THRR 40-80% of Max Heartrate 56-113    Ratings of Perceived Exertion 11-13    Perceived Dyspnea 0-4      Progression   Progression Continue to progress workloads to maintain intensity without signs/symptoms of physical distress.      Resistance Training   Training Prescription Yes    Weight blue bands    Reps 10-15             Perform Capillary Blood Glucose checks as needed.  Exercise Prescription Changes:   Exercise Comments:   Exercise Goals and Review:   Exercise Goals     Row Name 08/23/23 1039             Exercise Goals   Increase Physical Activity Yes       Intervention Provide advice, education, support and counseling about physical activity/exercise needs.;Develop an individualized exercise prescription for aerobic and resistive training based on initial evaluation findings, risk stratification, comorbidities and participant's personal goals.       Expected Outcomes Short Term: Attend rehab on a regular basis to increase amount of physical activity.;Long Term: Add in home exercise to make exercise part of routine and to increase amount of physical activity.;Long Term: Exercising regularly at least 3-5 days a week.       Increase Strength and Stamina Yes       Intervention Provide advice, education, support and counseling about physical activity/exercise needs.;Develop an individualized exercise prescription for aerobic and resistive training based on initial evaluation findings, risk stratification, comorbidities and participant's personal goals.       Expected Outcomes Short Term: Increase workloads from initial exercise prescription for resistance, speed, and METs.;Short Term: Perform resistance training exercises routinely during rehab and add in resistance training at  home;Long Term: Improve cardiorespiratory fitness,  muscular endurance and strength as measured by increased METs and functional capacity ( )       Able to understand and use rate of perceived exertion (RPE) scale Yes       Intervention Provide education and explanation on how to use RPE scale       Expected Outcomes Short Term: Able to use RPE daily in rehab to express subjective intensity level;Long Term:  Able to use RPE to guide intensity level when exercising independently       Able to understand and use Dyspnea scale Yes       Intervention Provide education and explanation on how to use Dyspnea scale       Expected Outcomes Short Term: Able to use Dyspnea scale daily in rehab to express subjective sense of shortness of breath during exertion;Long Term: Able to use Dyspnea scale to guide intensity level when exercising independently       Knowledge and understanding of Target Heart Rate Range (THRR) Yes       Intervention Provide education and explanation of THRR including how the numbers were predicted and where they are located for reference       Expected Outcomes Short Term: Able to state/look up THRR;Long Term: Able to use THRR to govern intensity when exercising independently;Short Term: Able to use daily as guideline for intensity in rehab       Understanding of Exercise Prescription Yes       Intervention Provide education, explanation, and written materials on patient's individual exercise prescription       Expected Outcomes Short Term: Able to explain program exercise prescription;Long Term: Able to explain home exercise prescription to exercise independently                Exercise Goals Re-Evaluation :   Discharge Exercise Prescription (Final Exercise Prescription Changes):   Nutrition:  Target Goals: Understanding of nutrition guidelines, daily intake of sodium 1500mg , cholesterol 200mg , calories 30% from fat and 7% or less from saturated fats, daily to have 5 or more servings of fruits and  vegetables.  Biometrics:  Pre Biometrics - 08/23/23 1024       Pre Biometrics   Grip Strength 30 kg              Nutrition Therapy Plan and Nutrition Goals:   Nutrition Assessments:  MEDIFICTS Score Key: >=70 Need to make dietary changes  40-70 Heart Healthy Diet <= 40 Therapeutic Level Cholesterol Diet   Picture Your Plate Scores: <16 Unhealthy dietary pattern with much room for improvement. 41-50 Dietary pattern unlikely to meet recommendations for good health and room for improvement. 51-60 More healthful dietary pattern, with some room for improvement.  >60 Healthy dietary pattern, although there may be some specific behaviors that could be improved.    Nutrition Goals Re-Evaluation:   Nutrition Goals Discharge (Final Nutrition Goals Re-Evaluation):   Psychosocial: Target Goals: Acknowledge presence or absence of significant depression and/or stress, maximize coping skills, provide positive support system. Participant is able to verbalize types and ability to use techniques and skills needed for reducing stress and depression.  Initial Review & Psychosocial Screening:  Initial Psych Review & Screening - 08/23/23 1036       Initial Review   Current issues with None Identified      Family Dynamics   Good Support System? Yes    Comments Has friends and family as her support      Barriers  Psychosocial barriers to participate in program There are no identifiable barriers or psychosocial needs.      Screening Interventions   Interventions Encouraged to exercise             Quality of Life Scores:  Scores of 19 and below usually indicate a poorer quality of life in these areas.  A difference of  2-3 points is a clinically meaningful difference.  A difference of 2-3 points in the total score of the Quality of Life Index has been associated with significant improvement in overall quality of life, self-image, physical symptoms, and general health in  studies assessing change in quality of life.  PHQ-9: Review Flowsheet  More data exists      08/23/2023 07/14/2023 05/24/2023 04/28/2023 04/11/2023  Depression screen PHQ 2/9  Decreased Interest 0 0 0 0 0  Down, Depressed, Hopeless 0 0 0 0 0  PHQ - 2 Score 0 0 0 0 0  Altered sleeping 0 - - - -  Tired, decreased energy 1 - - - -  Change in appetite 0 - - - -  Feeling bad or failure about yourself  0 - - - -  Trouble concentrating 0 - - - -  Moving slowly or fidgety/restless 0 - - - -  Suicidal thoughts 0 - - - -  PHQ-9 Score 1 - - - -  Difficult doing work/chores Not difficult at all - - - -    Details           Interpretation of Total Score  Total Score Depression Severity:  1-4 = Minimal depression, 5-9 = Mild depression, 10-14 = Moderate depression, 15-19 = Moderately severe depression, 20-27 = Severe depression   Psychosocial Evaluation and Intervention:  Psychosocial Evaluation - 08/23/23 1037       Psychosocial Evaluation & Interventions   Interventions Encouraged to exercise with the program and follow exercise prescription    Comments Madison Clements denies any psychosocial barriers or concerns at this time.    Expected Outcomes For Madison Clements to participate in PR free of any psychosocial barriers or concerns    Continue Psychosocial Services  No Follow up required             Psychosocial Re-Evaluation:   Psychosocial Discharge (Final Psychosocial Re-Evaluation):   Education: Education Goals: Education classes will be provided on a weekly basis, covering required topics. Participant will state understanding/return demonstration of topics presented.  Learning Barriers/Preferences:  Learning Barriers/Preferences - 08/23/23 1037       Learning Barriers/Preferences   Learning Barriers None    Learning Preferences Written Material;Skilled Demonstration             Education Topics: Know Your Numbers Group instruction that is supported by a PowerPoint  presentation. Instructor discusses importance of knowing and understanding resting, exercise, and post-exercise oxygen saturation, heart rate, and blood pressure. Oxygen saturation, heart rate, blood pressure, rating of perceived exertion, and dyspnea are reviewed along with a normal range for these values.    Exercise for the Pulmonary Patient Group instruction that is supported by a PowerPoint presentation. Instructor discusses benefits of exercise, core components of exercise, frequency, duration, and intensity of an exercise routine, importance of utilizing pulse oximetry during exercise, safety while exercising, and options of places to exercise outside of rehab.    MET Level  Group instruction provided by PowerPoint, verbal discussion, and written material to support subject matter. Instructor reviews what METs are and how to increase METs.  Pulmonary Medications Verbally interactive group education provided by instructor with focus on inhaled medications and proper administration.   Anatomy and Physiology of the Respiratory System Group instruction provided by PowerPoint, verbal discussion, and written material to support subject matter. Instructor reviews respiratory cycle and anatomical components of the respiratory system and their functions. Instructor also reviews differences in obstructive and restrictive respiratory diseases with examples of each.    Oxygen Safety Group instruction provided by PowerPoint, verbal discussion, and written material to support subject matter. There is an overview of "What is Oxygen" and "Why do we need it".  Instructor also reviews how to create a safe environment for oxygen use, the importance of using oxygen as prescribed, and the risks of noncompliance. There is a brief discussion on traveling with oxygen and resources the patient may utilize.   Oxygen Use Group instruction provided by PowerPoint, verbal discussion, and written material to  discuss how supplemental oxygen is prescribed and different types of oxygen supply systems. Resources for more information are provided.    Breathing Techniques Group instruction that is supported by demonstration and informational handouts. Instructor discusses the benefits of pursed lip and diaphragmatic breathing and detailed demonstration on how to perform both.     Risk Factor Reduction Group instruction that is supported by a PowerPoint presentation. Instructor discusses the definition of a risk factor, different risk factors for pulmonary disease, and how the heart and lungs work together.   Pulmonary Diseases Group instruction provided by PowerPoint, verbal discussion, and written material to support subject matter. Instructor gives an overview of the different type of pulmonary diseases. There is also a discussion on risk factors and symptoms as well as ways to manage the diseases.   Stress and Energy Conservation Group instruction provided by PowerPoint, verbal discussion, and written material to support subject matter. Instructor gives an overview of stress and the impact it can have on the body. Instructor also reviews ways to reduce stress. There is also a discussion on energy conservation and ways to conserve energy throughout the day.   Warning Signs and Symptoms Group instruction provided by PowerPoint, verbal discussion, and written material to support subject matter. Instructor reviews warning signs and symptoms of stroke, heart attack, cold and flu. Instructor also reviews ways to prevent the spread of infection.   Other Education Group or individual verbal, written, or video instructions that support the educational goals of the pulmonary rehab program.    Knowledge Questionnaire Score:  Knowledge Questionnaire Score - 08/23/23 1110       Knowledge Questionnaire Score   Pre Score 16/18             Core Components/Risk Factors/Patient Goals at Admission:   Personal Goals and Risk Factors at Admission - 08/23/23 1038       Core Components/Risk Factors/Patient Goals on Admission   Improve shortness of breath with ADL's Yes    Intervention Provide education, individualized exercise plan and daily activity instruction to help decrease symptoms of SOB with activities of daily living.    Expected Outcomes Short Term: Improve cardiorespiratory fitness to achieve a reduction of symptoms when performing ADLs;Long Term: Be able to perform more ADLs without symptoms or delay the onset of symptoms             Core Components/Risk Factors/Patient Goals Review:    Core Components/Risk Factors/Patient Goals at Discharge (Final Review):    ITP Comments:   Comments: Dr. Mechele Collin is Medical Director for Pulmonary Rehab at [facility]  [facility].

## 2023-08-23 NOTE — Progress Notes (Signed)
Madison Clements 77 y.o. female Pulmonary Rehab Orientation Note This patient who was referred to Pulmonary Rehab by Dr. Mayford Knife with the diagnosis of Pulmonary hypertension arrived today in Cardiac and Pulmonary Rehab. She  arrived ambulatory with normal gait. She  does not carry portable oxygen. [employer] is the provider for their DME. Per patient, Madison Clements uses oxygen never. Color good, skin warm and dry. Patient is oriented to time and place. Patient's medical history, psychosocial health, and medications reviewed. Psychosocial assessment reveals patient lives with alone. Madison Clements is currently retired. Patient hobbies include spending time with others and playing cards . Patient reports her stress level is low. Areas of stress/anxiety include health. Patient does not exhibit signs of depression. PHQ2/9 score 0/1. Madison Clements shows good  coping skills with positive outlook on life. Offered emotional support and reassurance. Will continue to monitor. Physical assessment performed by Nurse pick: Essie Hart RN. Please see their orientation physical assessment note. Sandria reports she does take medications as prescribed. Patient states she follows a regular  diet. The patient reports no specific efforts to gain or lose weight.. Patient's weight will be monitored closely. Demonstration and practice of PLB using pulse oximeter. Madison Clements able to return demonstration satisfactorily. Safety and hand hygiene in the exercise area reviewed with patient. Madison Clements voices understanding of the information reviewed. Department expectations discussed with patient and achievable goals were set. The patient shows enthusiasm about attending the program and we look forward to working with Madison Clements. Madison Clements completed a 6 min walk test today and is scheduled to begin exercise on 09/05/23@ 10:15.   [id number] Madison Clements, BSRT

## 2023-08-23 NOTE — Progress Notes (Signed)
Pulmonary Rehab Orientation Physical Assessment Note  Physical assessment reveals patient is alert and oriented x 4. Heart rate is normal, breath sounds clear to auscultation, no wheezes, rales, or rhonchi. Pt denies chronic cough. Bowel sounds present x4 quads. Pt denies abdominal discomfort, nausea, vomiting, diarrhea or constipation. Grip strength equal, strong. Distal pulses +2; no swelling to lower extremities but patient states her feet swell at night.   Essie Hart, RN, BSN

## 2023-08-24 ENCOUNTER — Telehealth: Payer: Self-pay | Admitting: Hematology

## 2023-08-26 NOTE — Progress Notes (Unsigned)
Cardiology Office Note:  .   Date:  08/31/2023  ID:  Madison Clements, DOB Nov 15, 1945, MRN [id number] PCP: Madison Ora, MD  [facility] Providers Cardiologist:  Madison Magic, MD    Patient Profile: .      PMH Pulmonary hypertension Likely combination of WHO group 2 pulmonary venous hypertension from diastolic dysfunction and Group 3 from OSA Coronary artery disease Mild CAD with calcified vessels on cath 2016 Coronary CTA 07/19/2022 CAC score 344 (76th percentile) LAD 25-49% calcified plaque prox 50-69% calcified plaque mid vessel at D1 take off 25-49% plaque distally D1 1-24% calcified plaque proximally LCx 1-24% mixed plaque prox/mid RCA 1-25% calcified plaque prox, 1-24% mixed plaque mid, 1-24% calcified plaque distal R/LHC 06/20/2023 Prox RCA to Mid RCA 20% Mid Cx 30% Ost LAD to mLAD 40% 1st diag 60% Normal filling pressures, normal PAP Hypertension Hyperlipidemia Estrogen receptor positive breast CA OSA on CPAP Chronic HFpEF SVT  Echocardiogram in 2018 in [place] showed normal LV function EF 55-60, G2 DD, mild to moderate TR and mild pulmonary hypertension with RVSP 39 mmHg.  Repeat 2D echo 1 year later was unchanged.  Nuclear stress test 2015 showed no ischemia.  Cardiac catheterization 2016 showed mild CAD with calcified vessels.  Home sleep study 07/2021 showed moderate OSA with AHI 18/H and O2 sats as low as 72%.  She was referred to ENT for consideration of inspire device and was scheduled for sleep induced endoscopy, unfortunately was diagnosed with breast cancer in the interim and endoscopy was postponed.  She underwent lumpectomy on 09/09/2021.  Echocardiogram 03/2022 showed normalization of PA pressures with treatment of sleep apnea on CPAP, normal LVEF.  Cardiac monitor for palpitations revealed multiple episodes of atrial tachycardia with longest lasting 6 minutes 2 seconds.  She was advised to continue Cardizem and start Toprol-XL 25 mg  daily. Was referred to EP with plan to continue medical therapy. She later reported sleep disturbance with metoprolol and it was discontinued. Cardizem dose was increased.   Seen in clinic 05/18/23 by Dr. Mayford Clements, was tolerating PAP therapy well. She reported chronic DOE walking up stairs and other short distances which was very frustrating to her.  Was also having palpitations that coincided with shortness of breath.  Dr. Mayford Clements recommended that she proceed with right and left heart cath to assess worsening DOE.  Cardiac cath 06/20/2023 revealed nonobstructive CAD, normal filling pressures, and normal pulmonary artery pressure. Cardiac monitor revealed predominant NSR with average HR 69 bpm, NSVT lasting 7 beats, multiple episodes of SVT. She was advised to discontinue lisinopril and increase Cardizem to 300 mg daily.   Seen by me on 07/25/23 for follow-up, accompanied by her sister-in-law. She reported persistent shortness of breath, often associated with palpitations. Reports feeling winded after minimal physical exertion, such as walking half a block or vacuuming. Heart rates range from 40 to 133 beats per minute.  No chest pain, orthopnea, PND, presyncope, syncope.  She did not tolerate metoprolol, describing a feeling of being filled with cement and being unable to function. Since increasing diltiazem and stopping lisinopril, her blood pressure has been fluctuating, with recent readings ranging from 124/64 to 152/84. Reports other family members, including her father who had CABG, did not tolerate beta blockers. Noted increased pedal edema when Lasix was decreased prior to cath. No improvement in symptoms since increasing diltiazem from 240 mg daily to 300 mg daily. We reviewed echo, cath, and CPX results in detail and questions were answered to  her satisfaction. Consideration given to various agents for BP management. Initially she was started on nebivolol, but HR was low and she had a headaches, so she  resumed lisinoprol which she was taking prior to cardiac cath. We referred her to pulmonary rehab for management of dyspnea.        History of Present Illness: .   Madison Clements is a very pleasant 77 y.o. female who is here today for follow-up of dyspnea and hypertension. She had previously tried nebivolol for hypertension but experienced an uncomfortably low heart rate. HR remained consistently < 60 bpm on 1/2 dose and she discontinued the medication. She then then resumed lisinopril, which seems to be managing her blood pressure effectively. She reports some improvement in palpitations and dyspnea. One recent episode of heart rate exceeding 120 bpm. She still experiences breathlessness when walking on an incline. Is starting cardiac rehab, which she hopes will further improve her symptoms. She is on B12 shots and prenatal vitamins for low hemoglobin levels. Recently discontinued Celebrex for arthritis which her sister-in-law wonders if this has contributed to her improvement in symptoms. Recently recovered from a urinary tract infection.  She decreased her intake of Lasix during this time and did not have any adverse effects of edema or increased shortness of breath.  She denies chest pain, orthopnea, PND, presyncope, syncope.  Discussed the use of AI scribe software for clinical note transcription with the patient, who gave verbal consent to proceed.   ROS: See HPI       Studies Reviewed: .        Risk Assessment/Calculations:             Physical Exam:   VS:  BP 138/62   Pulse 61   Ht 5\' 3"  (1.6 m)   Wt 148 lb 12.8 oz (67.5 kg)   SpO2 96%   BMI 26.36 kg/m    Wt Readings from Last 3 Encounters:  08/31/23 148 lb 12.8 oz (67.5 kg)  08/23/23 153 lb (69.4 kg)  08/16/23 153 lb 9.6 oz (69.7 kg)    GEN: Well nourished, well developed in no acute distress NECK: No JVD; No carotid bruits CARDIAC: RRR, no murmurs, rubs, gallops RESPIRATORY:  Clear to auscultation without rales,  wheezing or rhonchi  ABDOMEN: Soft, non-tender, non-distended EXTREMITIES:  No edema; No deformity     ASSESSMENT AND PLAN: .    DOE/Pulmonary hypertension: She reports DOE with walking up inclines. No edema, orthopnea or PND. Appears euvolemic on exam. Right and left heart catheterization 06/20/2023 revealed normal filling pressures. Echo 05/11/2023 revealed normal EF 60 to 65%, read out as severe leakiness of tricuspid valve with normal RV and RA size. CPX 02/15/23 reealed normal functional capacity with low ventilatory threshold suggestive of exercise-training effect possibly limited by body habitus.  She reports she is actually feeling better since last office visit 07/25/2023.  We tried nebivolol for management of DOE/SVT which she did not tolerate. She resumed lisinopril and is feeling well on current dose. She is starting pulmonary rehab.  We will continue Lasix 40 mg daily. Intolerant of beta blockers. Will get BMET today to ensure stable renal function. Continue low sodium diet.   Palpitations/SVT: Cardiac monitor completed 06/09/23 revealed multiple episodes of SVT with longest lasting about one minute. Occasional palpitations. She has not been able to tolerate metoprolol or nebivolol.  Continue diltiazem.  CAD: Nonobstructive CAD on [facility] 06/20/23. Has DOE with walking up inclines but no chest pain.  No  symptoms concerning for worsening angina.  She is starting pulmonary rehab.  Slight bruising since starting aspirin. I have asked her to continue aspirin 81 mg daily and notify us if bruising worsens. Continue rosuvastatin.  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 She does not appear volume overloaded on exam. Normal right heart filling pressures on cath 06/20/23. Plan to repeat echo in 6 months. She has chronic stable DOE. We will monitor clinically for now. Echo scheduled for 11/13/22.   OSA: She reports compliance with PAP.  No acute  concerns.  Hypertension: BP has been well controlled since resume lisinopril. Will get BMET today to ensure stable renal function.    Hyperlipidemia LDL goal < 70: LDL 64 on 03/08/23, at goal. Continue rosuvastatin.       Dispo: Follow-up in 3-4 months with Dr. Mayford Clements after your echo  Signed, Eligha Bridegroom, NP-C

## 2023-08-27 ENCOUNTER — Encounter (HOSPITAL_COMMUNITY): Payer: Self-pay

## 2023-08-27 ENCOUNTER — Other Ambulatory Visit: Payer: Self-pay

## 2023-08-27 ENCOUNTER — Ambulatory Visit (HOSPITAL_COMMUNITY)
Admission: RE | Admit: 2023-08-27 | Discharge: 2023-08-27 | Disposition: A | Discharge status: Home / Self Care | Payer: Medicare Other | Source: Ambulatory Visit | Attending: Emergency Medicine | Admitting: Emergency Medicine

## 2023-08-27 VITALS — BP 137/78 | HR 72 | Temp 97.8°F | Resp 16

## 2023-08-27 DIAGNOSIS — N3001 Acute cystitis with hematuria: Secondary | ICD-10-CM | POA: Diagnosis not present

## 2023-08-27 LAB — POCT URINALYSIS DIP (MANUAL ENTRY)
Bilirubin, UA: NEGATIVE
Glucose, UA: 100 mg/dL — AB
Ketones, POC UA: NEGATIVE mg/dL
Nitrite, UA: POSITIVE — AB
Protein Ur, POC: NEGATIVE mg/dL
Spec Grav, UA: <=1.005 — AB (ref 1.010–1.025)
Urobilinogen, UA: 1 U/dL
pH, UA: 6 (ref 5.0–8.0)

## 2023-08-27 MED ORDER — CEPHALEXIN 500 MG PO CAPS: 500.0000 mg | ORAL_CAPSULE | Freq: Two times a day (BID) | ORAL | 0 refills | Status: AC

## 2023-08-27 NOTE — Discharge Instructions (Addendum)
We will call you if anything on urine culture requires a change in therapy (about 1-3 days)  In the meantime I am treating you for a urinary tract infection. Please take the antibiotic as prescribed, with food to avoid upset stomach. Drink lots of fluids!  Please watch for worsening symptoms including abdominal pain, upper back pain, fever, weakness, confusion.  If these occur please return immediately or go to the emergency department.

## 2023-08-27 NOTE — ED Triage Notes (Signed)
Pt c/o burning sensation and frequency with urination for the past 2 days. Taking OTC medication with no relief.

## 2023-08-27 NOTE — ED Provider Notes (Signed)
[facility]    CSN: [id number] Arrival date & time: 08/27/23  1303     History   Chief Complaint Chief Complaint  Patient presents with   Urinary Frequency    Took drug store UTI TEST AND BOTH leukocyte and nitrite showed positive on test strip. - Entered by patient    HPI Madison Clements is a 77 y.o. female.  2-day history of dysuria and urinary frequency Reports she bought an over-the-counter test that had positive nitrates and leukocytes Has taken azo 2 doses No fever, abd pain, flank pain  Past Medical History:  Diagnosis Date   Allergy 1990   Morphine following surgery   Arthritis    generalized   Atypical mole 02/24/2015   RGHT LATERAL THIGH MODERATE   Atypical mole 01/20/2022   Left Breast (mild)   Atypical mole 01/20/2022   Mid Back (mild)   Atypical nevi 02/24/2015   RIGHT NECK MILD   Atypical nevi 08/25/2015   LEFT UPPER ARM MILD   Atypical nevi 08/25/2015   LEFT LATERAL FOREARM MILD/FREE   Atypical nevi 02/23/2016   LEFT THIGH MODERATE/FREE   Atypical nevi 02/23/2016   LEFT MEDIAL LEG MODERATE/FREE   Atypical nevi 02/23/2016   LEFT UPPER BACK MILD /FREE   Atypical nevi 08/23/2016   RIGHT ANT PROX THIGH MILD /FREE   Atypical nevi 07/25/2018   MID LOWER BACK MODERATE   Atypical nevi 01/22/2019   LEFT MID BACK MILD /FREE   Atypical nevi 01/22/2019   LEFT NECK MILD   Atypical nevi 08/06/2019   LEFT INNER BREAST MODERATE W/S   Atypical nevi 08/06/2019   LEFT OUTER SIDE MILD/FREE   Blood transfusion without reported diagnosis    hx of   Breast cancer (HCC) 2022   CAD (coronary artery disease), native coronary artery 02/07/2019   Minimal CAD at the time of cath in 2016 in [facility]   Family history of breast cancer 08/19/2021   Family history of pancreatic cancer 08/19/2021   Fatty liver 05/21/2020   Gastric polyps 10/02/2018   EGD 03/2015; benign   GERD (gastroesophageal reflux disease) 10/02/2018   on meds    History of melanoma 10/02/2018   Abdomen 2012; 2013; s/p local excisions.    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 DISTAL LOWER LEG TREATED BY DR. Leda Quail   Sleep apnea    Sleep apnea 05/26/2020   uses CPAP   Tricuspid regurgitation    moderate by echo 04/2023    Patient Active Problem List   Diagnosis Date Noted   Tricuspid regurgitation    Primary osteoarthritis of right shoulder 04/11/2023   Trochanteric bursitis of both hips 04/11/2023   Atrial tachycardia (HCC) 02/09/2023   Osteopenia 09/19/2022   SVT (supraventricular tachycardia) (HCC) 07/11/2022   Genetic testing 08/30/2021   Family history of breast cancer 08/19/2021   Family history of pancreatic cancer 08/19/2021   Malignant neoplasm of overlapping sites of left breast in female, estrogen receptor positive (HCC) 08/16/2021   Hepatic steatosis 07/28/2020  OSA (obstructive sleep apnea) 05/26/2020   Coronary artery disease involving native coronary artery of native heart without angina pectoris 02/07/2019   GERD (gastroesophageal reflux disease) 10/02/2018   Essential hypertension 10/02/2018   Mixed hyperlipidemia 10/02/2018   Osteoporosis of forearm 10/02/2018   Primary osteoarthritis involving multiple joints 10/02/2018   Pulmonary hypertension, primary (HCC) 10/02/2018   History of melanoma 10/02/2018   Polyp of colon, adenomatous 10/02/2018   Gastric polyps 10/02/2018    Past Surgical History:  Procedure Laterality Date   ABDOMINAL HYSTERECTOMY  1990   APPENDECTOMY  1962   BLADDER SUSPENSION  1990   BREAST LUMPECTOMY  WITH RADIOACTIVE SEED LOCALIZATION Left 09/09/2021   Procedure: LEFT BREAST LUMPECTOMY WITH RADIOACTIVE SEED LOCALIZATION X2;  Surgeon: Manus Rudd, MD;  Location: [facility];  Service: General;  Laterality: Left;   COLONOSCOPY  2016   in [place]-hx of polyps   EYE SURGERY  2013 torn retina laser   MENISCUS REPAIR Right 2001   right knee   RIGHT HEART CATH AND CORONARY ANGIOGRAPHY N/A 06/20/2023   Procedure: RIGHT HEART CATH AND CORONARY ANGIOGRAPHY;  Surgeon: Laurey Morale, MD;  Location: [facility] INVASIVE CV LAB;  Service: Cardiovascular;  Laterality: N/A;   thumb surgery Left    TONSILLECTOMY AND ADENOIDECTOMY     tuabl ligation     TUBAL LIGATION  1975   WISDOM TOOTH EXTRACTION      OB History   No obstetric history on file.      Home Medications    Prior to Admission medications   Medication Sig Start Date End Date Taking? Authorizing Provider  cephALEXin (KEFLEX) 500 MG capsule Take 1 capsule (500 mg total) by mouth 2 (two) times daily for 5 days. 08/27/23 09/01/23 Yes Casmer Yepiz, Ray Church  aspirin EC 81 MG tablet Take 1 tablet (81 mg total) by mouth daily. Swallow whole. 05/18/23   Quintella Reichert, MD  diltiazem (CARDIZEM CD) 300 MG 24 hr capsule Take 1 capsule (300 mg total) by mouth daily. 06/20/23   Quintella Reichert, MD  furosemide (LASIX) 40 MG tablet Take 40 mg by mouth daily.    [provider]  lisinopril (ZESTRIL) 10 MG tablet Take 10 mg by mouth daily.    [provider]  nebivolol (BYSTOLIC) 5 MG tablet Take 1 tablet (5 mg total) by mouth daily. 07/26/23   Swinyer, Zachary George, NP  Prenatal Vit-Fe Fumarate-FA (PRENATAL VITAMIN PO) Take by mouth daily. Prenatal w/folic acid po daily    [provider]  Probiotic Product (ADVANCED PROBIOTIC PO) Take 1 capsule by mouth daily.    [provider]  rosuvastatin (CRESTOR) 40 MG tablet Take 1 tablet (40 mg total) by mouth at bedtime. 07/12/22   Willow Ora, MD    Family  History Family History  Problem Relation Age of Onset   Arthritis Mother    High blood pressure Mother    Hyperlipidemia Mother    Hypertension Mother    Stroke Mother    Varicose Veins Mother    Heart attack Father    Heart disease Father    High blood pressure Father    Hypertension Father    Arthritis Sister    Diabetes Sister    High Cholesterol Sister    High blood pressure Sister    Breast cancer Sister 75   Varicose Veins Sister    Pancreatic cancer Maternal Aunt        dx early 89s  Heart disease Maternal Uncle    Heart disease Maternal Uncle    Stroke Paternal Aunt    Early death Maternal Grandmother        Stroke at 13   High blood pressure Maternal Grandmother    Hypertension Maternal Grandmother    Stroke Maternal Grandmother    Asthma Maternal Grandfather    Kidney disease Maternal Grandfather    Arthritis Paternal Grandmother    Heart disease Paternal Grandmother    High blood pressure Paternal Grandmother    Hypertension Paternal Grandmother    Kidney disease Paternal Grandfather    Asthma Paternal Grandfather    COPD Paternal Grandfather    Prostate cancer Cousin        paternal cousins x2; dx after 58   Colon polyps Neg Hx    Colon cancer Neg Hx    Esophageal cancer Neg Hx    Rectal cancer Neg Hx    Stomach cancer Neg Hx     Social History Social History   Tobacco Use   Smoking status: Former    Current packs/day: 0.00    Average packs/day: 0.3 packs/day for 10.0 years (2.5 ttl pk-yrs)    Types: Cigarettes    Start date: 09/12/1986    Quit date: 09/12/1996    Years since quitting: 26.9   Smokeless tobacco: Never  Vaping Use   Vaping status: Never Used  Substance Use Topics   Alcohol use: Not Currently    Comment: occassional    Drug use: Never     Allergies   Morphine and Morphine and codeine   Review of Systems Review of Systems Per HPI  Physical Exam Triage Vital Signs ED Triage Vitals [08/27/23 1327]  Encounter Vitals  Group     BP (!) 166/73     Systolic BP Percentile      Diastolic BP Percentile      Pulse Rate 72     Resp 16     Temp 97.8 F (36.6 C)     Temp Source Oral     SpO2 98 %     Weight      Height      Head Circumference      Peak Flow      Pain Score 5     Pain Loc      Pain Education      Exclude from Growth Chart    No data found.  Updated Vital Signs BP 137/78 (BP Location: Right Arm)   Pulse 72   Temp 97.8 F (36.6 C) (Oral)   Resp 16   SpO2 98%    Physical Exam Vitals and nursing note reviewed.  Constitutional:      General: She is not in acute distress. HENT:     Mouth/Throat:     Mouth: Mucous membranes are moist.     Pharynx: Oropharynx is clear.  Eyes:     Conjunctiva/sclera: Conjunctivae normal.  Cardiovascular:     Rate and Rhythm: Normal rate and regular rhythm.     Heart sounds: Normal heart sounds.  Pulmonary:     Effort: Pulmonary effort is normal.     Breath sounds: Normal breath sounds.  Abdominal:     General: Bowel sounds are normal.     Palpations: Abdomen is soft.     Tenderness: There is no abdominal tenderness. There is no right CVA tenderness, left CVA tenderness or guarding.  Neurological:     Mental Status: She is alert and oriented  to person, place, and time.      UC Treatments / Results  Labs (all labs ordered are listed, but only abnormal results are displayed) Labs Reviewed  POCT URINALYSIS DIP (MANUAL ENTRY) - Abnormal; Notable for the following components:      Result Value   Color, UA orange (*)    Glucose, UA =100 (*)    Spec Grav, UA <=1.005 (*)    Blood, UA small (*)    Nitrite, UA Positive (*)    Leukocytes, UA Small (1+) (*)    All other components within normal limits  URINE CULTURE    EKG  Radiology No results found.  Procedures Procedures  Medications Ordered in UC Medications - No data to display  Initial Impression / Assessment and Plan / UC Course  I have reviewed the triage vital signs and  the nursing notes.  Pertinent labs & imaging results that were available during my care of the patient were reviewed by me and considered in my medical decision making (see chart for details).  Afebrile, well appearing. Initially hypertensive but normalized on recheck. UA with small leuks, +nitrates, small RBC Will culture. Not sure if nitrates are from azo use In the meantime treat for acute cystitis with keflex BID x 5 Increased fluids, monitoring symptoms  Return precautions discussed. Patient agrees to plan  Final Clinical Impressions(s) / UC Diagnoses   Final diagnoses:  Acute cystitis with hematuria     Discharge Instructions      We will call you if anything on urine culture requires a change in therapy (about 1-3 days)  In the meantime I am treating you for a urinary tract infection. Please take the antibiotic as prescribed, with food to avoid upset stomach. Drink lots of fluids!  Please watch for worsening symptoms including abdominal pain, upper back pain, fever, weakness, confusion.  If these occur please return immediately or go to the emergency department.     ED Prescriptions     Medication Sig Dispense Auth. Provider   cephALEXin (KEFLEX) 500 MG capsule Take 1 capsule (500 mg total) by mouth 2 (two) times daily for 5 days. 10 capsule Milledge Gerding, Lurena Joiner, PA-C      PDMP not reviewed this encounter.   Marlow Baars, [place] 08/27/23 2956

## 2023-08-29 LAB — URINE CULTURE: Culture: 50000 — AB

## 2023-08-30 DIAGNOSIS — M79671 Pain in right foot: Secondary | ICD-10-CM | POA: Diagnosis not present

## 2023-08-30 DIAGNOSIS — L600 Ingrowing nail: Secondary | ICD-10-CM | POA: Diagnosis not present

## 2023-08-31 ENCOUNTER — Encounter (HOSPITAL_COMMUNITY): Payer: Medicare Other

## 2023-08-31 ENCOUNTER — Encounter: Payer: Self-pay | Admitting: Nurse Practitioner

## 2023-08-31 ENCOUNTER — Ambulatory Visit: Payer: Medicare Other | Attending: Nurse Practitioner | Admitting: Nurse Practitioner

## 2023-08-31 VITALS — BP 138/62 | HR 61 | Ht 63.0 in | Wt 148.8 lb

## 2023-08-31 DIAGNOSIS — R0609 Other forms of dyspnea: Secondary | ICD-10-CM | POA: Diagnosis not present

## 2023-08-31 DIAGNOSIS — I2510 Atherosclerotic heart disease of native coronary artery without angina pectoris: Secondary | ICD-10-CM | POA: Insufficient documentation

## 2023-08-31 DIAGNOSIS — I272 Pulmonary hypertension, unspecified: Secondary | ICD-10-CM | POA: Diagnosis not present

## 2023-08-31 DIAGNOSIS — H2513 Age-related nuclear cataract, bilateral: Secondary | ICD-10-CM | POA: Diagnosis not present

## 2023-08-31 DIAGNOSIS — E785 Hyperlipidemia, unspecified: Secondary | ICD-10-CM | POA: Insufficient documentation

## 2023-08-31 DIAGNOSIS — H25013 Cortical age-related cataract, bilateral: Secondary | ICD-10-CM | POA: Diagnosis not present

## 2023-08-31 DIAGNOSIS — H31001 Unspecified chorioretinal scars, right eye: Secondary | ICD-10-CM | POA: Diagnosis not present

## 2023-08-31 DIAGNOSIS — I471 Supraventricular tachycardia, unspecified: Secondary | ICD-10-CM | POA: Insufficient documentation

## 2023-08-31 DIAGNOSIS — G4733 Obstructive sleep apnea (adult) (pediatric): Secondary | ICD-10-CM | POA: Diagnosis not present

## 2023-08-31 DIAGNOSIS — R002 Palpitations: Secondary | ICD-10-CM | POA: Diagnosis not present

## 2023-08-31 DIAGNOSIS — I361 Nonrheumatic tricuspid (valve) insufficiency: Secondary | ICD-10-CM | POA: Diagnosis not present

## 2023-08-31 DIAGNOSIS — H52203 Unspecified astigmatism, bilateral: Secondary | ICD-10-CM | POA: Diagnosis not present

## 2023-08-31 NOTE — Patient Instructions (Signed)
Medication Instructions:   Your physician recommends that you continue on your current medications as directed. Please refer to the Current Medication list given to you today.   *If you need a refill on your cardiac medications before your next appointment, please call your pharmacy*   Lab Work:  TODAY!!!! BMET  If you have labs (blood work) drawn today and your tests are completely normal, you will receive your results only by: MyChart Message (if you have MyChart) OR A paper copy in the mail If you have any lab test that is abnormal or we need to change your treatment, we will call you to review the results.   Testing/Procedures:  Your physician has requested that you have an echocardiogram. Echocardiography is a painless test that uses sound waves to create images of your heart. It provides your doctor with information about the size and shape of your heart and how well your heart's chambers and valves are working. This procedure takes approximately one hour. There are no restrictions for this procedure. Please do NOT wear cologne, perfume  or lotions (deodorant is allowed). Please arrive 15 minutes prior to your appointment time.  Please note: We ask at that you not bring children with you during ultrasound (echo/ vascular) testing. Due to room size and safety concerns, children are not allowed in the ultrasound rooms during exams. Our front office staff cannot provide observation of children in our lobby area while testing is being conducted. An adult accompanying a patient to their appointment will only be allowed in the ultrasound room at the discretion of the ultrasound technician under special circumstances. We apologize for any inconvenience.    Follow-Up: At [facility], you and your health needs are our priority.  As part of our continuing mission to provide you with exceptional heart care, we have created designated Provider Care Teams.  These Care Teams include  your primary Cardiologist (physician) and Advanced Practice Providers (APPs -  Physician Assistants and Nurse Practitioners) who all work together to provide you with the care you need, when you need it.  We recommend signing up for the patient portal called "MyChart".  Sign up information is provided on this After Visit Summary.  MyChart is used to connect with patients for Virtual Visits (Telemedicine).  Patients are able to view lab/test results, encounter notes, upcoming appointments, etc.  Non-urgent messages can be sent to your provider as well.   To learn more about what you can do with MyChart, go to [email or web address].    Your next appointment:   3 month(s)  Provider:   Armanda Magic, MD

## 2023-09-01 LAB — BASIC METABOLIC PANEL
BUN/Creatinine Ratio: 19 (ref 12–28)
BUN: 23 mg/dL (ref 8–27)
CO2: 21 mmol/L (ref 20–29)
Calcium: 10.6 mg/dL — ABNORMAL HIGH (ref 8.7–10.3)
Chloride: 101 mmol/L (ref 96–106)
Creatinine, Ser: 1.18 mg/dL — ABNORMAL HIGH (ref 0.57–1.00)
Glucose: 104 mg/dL — ABNORMAL HIGH (ref 70–99)
Potassium: 5 mmol/L (ref 3.5–5.2)
Sodium: 138 mmol/L (ref 134–144)
eGFR: 47 mL/min/{1.73_m2} — ABNORMAL LOW (ref >59)

## 2023-09-02 ENCOUNTER — Other Ambulatory Visit: Payer: Self-pay | Admitting: Family Medicine

## 2023-09-05 ENCOUNTER — Encounter (HOSPITAL_COMMUNITY)
Admission: RE | Admit: 2023-09-05 | Discharge: 2023-09-05 | Disposition: A | Discharge status: Home / Self Care | Payer: Medicare Other | Source: Ambulatory Visit | Attending: Cardiology | Admitting: Cardiology

## 2023-09-05 VITALS — Wt 149.3 lb

## 2023-09-05 DIAGNOSIS — I272 Pulmonary hypertension, unspecified: Secondary | ICD-10-CM

## 2023-09-05 NOTE — Progress Notes (Signed)
Pulmonary Individual Treatment Plan  Patient Details  Name: Madison Clements MRN: [id number] Date of Birth: 11/04/1945 Referring Provider:   Doristine Devoid Pulmonary Rehab Walk Test from 08/23/2023 in [facility]  Referring Provider Turner       Initial Encounter Date:  Flowsheet Row Pulmonary Rehab Walk Test from 08/23/2023 in [facility]  Date 08/23/23       Visit Diagnosis: Pulmonary hypertension (HCC)  Patient's Home Medications on Admission:   Current Outpatient Medications:    aspirin EC 81 MG tablet, Take 1 tablet (81 mg total) by mouth daily. Swallow whole., Disp: 30 tablet, Rfl: 12   diltiazem (CARDIZEM CD) 300 MG 24 hr capsule, Take 1 capsule (300 mg total) by mouth daily., Disp: 90 capsule, Rfl: 3   furosemide (LASIX) 40 MG tablet, Take 40 mg by mouth daily., Disp: , Rfl:    lisinopril (ZESTRIL) 10 MG tablet, Take 10 mg by mouth daily., Disp: , Rfl:    Prenatal Vit-Fe Fumarate-FA (PRENATAL VITAMIN PO), Take by mouth daily. Prenatal w/folic acid po daily, Disp: , Rfl:    Probiotic Product (ADVANCED PROBIOTIC PO), Take 1 capsule by mouth daily., Disp: , Rfl:    rosuvastatin (CRESTOR) 40 MG tablet, TAKE 1 TABLET (40 MG TOTAL) BY MOUTH AT BEDTIME., Disp: 90 tablet, Rfl: 3  Past Medical History: Past Medical History:  Diagnosis Date   Allergy 1990   Morphine following surgery   Arthritis    generalized   Atypical mole 02/24/2015   RGHT LATERAL THIGH MODERATE   Atypical mole 01/20/2022   Left Breast (mild)   Atypical mole 01/20/2022   Mid Back (mild)   Atypical nevi 02/24/2015   RIGHT NECK MILD   Atypical nevi 08/25/2015   LEFT UPPER ARM MILD   Atypical nevi 08/25/2015   LEFT LATERAL FOREARM MILD/FREE   Atypical nevi 02/23/2016   LEFT THIGH MODERATE/FREE   Atypical nevi 02/23/2016   LEFT MEDIAL LEG MODERATE/FREE   Atypical nevi 02/23/2016    LEFT UPPER BACK MILD /FREE   Atypical nevi 08/23/2016   RIGHT ANT PROX THIGH MILD /FREE   Atypical nevi 07/25/2018   MID LOWER BACK MODERATE   Atypical nevi 01/22/2019   LEFT MID BACK MILD /FREE   Atypical nevi 01/22/2019   LEFT NECK MILD   Atypical nevi 08/06/2019   LEFT INNER BREAST MODERATE W/S   Atypical nevi 08/06/2019   LEFT OUTER SIDE MILD/FREE   Blood transfusion without reported diagnosis    hx of   Breast cancer (HCC) 2022   CAD (coronary artery disease), native coronary artery 02/07/2019   Minimal CAD at the time of cath in 2016 in [facility]   Family history of breast cancer 08/19/2021   Family history of pancreatic cancer 08/19/2021   Fatty liver 05/21/2020   Gastric polyps 10/02/2018   EGD 03/2015; benign   GERD (gastroesophageal reflux disease) 10/02/2018   on meds   History of melanoma 10/02/2018   Abdomen 2012; 2013; s/p local excisions.    Hyperlipidemia    on meds   Hypertension    on meds   Melanoma (HCC) 2012   MM- central lower abdomen- (ECX) [facility]   Osteopenia    Personal history of radiation therapy    Polyp of colon, adenomatous 10/02/2018   Colonoscopy 03/2015; recheck in 2021   PONV (postoperative nausea and vomiting)  Pulmonary hypertension, primary (HCC) 10/02/2018   SCCA (squamous cell carcinoma) of skin 09/18/2017   RIGHT ANT. DISTAL LOWER LEG TREATED BY DR. Leda Quail   Sleep apnea    Sleep apnea 05/26/2020   uses CPAP   Tricuspid regurgitation    moderate by echo 04/2023    Tobacco Use: Social History   Tobacco Use  Smoking Status Former   Current packs/day: 0.00   Average packs/day: 0.3 packs/day for 10.0 years (2.5 ttl pk-yrs)   Types: Cigarettes   Start date: 09/12/1986   Quit date: 09/12/1996   Years since quitting: 26.9  Smokeless Tobacco Never    Labs: Review Flowsheet  More data exists      Latest Ref Rng & Units 03/09/2021 07/09/2021 05/18/2022 03/08/2023 06/20/2023  Labs for ITP Cardiac  and Pulmonary Rehab  Cholestrol 100 - 199 mg/dL 960  454  098  119  -  LDL (calc) 0 - 99 mg/dL 55  66  63  61  -  HDL-C >39 mg/dL 66  14.78  48  55  -  Trlycerides 0 - 149 mg/dL 295  621.3  086  578  -  Bicarbonate 20.0 - 28.0 mmol/L - - - - 21.6  21.9   TCO2 22 - 32 mmol/L - - - - 23  23   Acid-base deficit 0.0 - 2.0 mmol/L - - - - 3.0  3.0   O2 Saturation % - - - - 62  62     Details       Multiple values from one day are sorted in reverse-chronological order         Capillary Blood Glucose: No results found for: "GLUCAP"   Pulmonary Assessment Scores:  Pulmonary Assessment Scores     Row Name 08/23/23 1110         ADL UCSD   ADL Phase Entry     SOB Score total 53       CAT Score   CAT Score 17       mMRC Score   mMRC Score 4             UCSD: Self-administered rating of dyspnea associated with activities of daily living (ADLs) 6-point scale (0 = "not at all" to 5 = "maximal or unable to do because of breathlessness")  Scoring Scores range from 0 to 120.  Minimally important difference is 5 units  CAT: CAT can identify the health impairment of COPD patients and is better correlated with disease progression.  CAT has a scoring range of zero to 40. The CAT score is classified into four groups of low (less than 10), medium (10 - 20), high (21-30) and very high (31-40) based on the impact level of disease on health status. A CAT score over 10 suggests significant symptoms.  A worsening CAT score could be explained by an exacerbation, poor medication adherence, poor inhaler technique, or progression of COPD or comorbid conditions.  CAT MCID is 2 points  mMRC: mMRC (Modified Medical Research Council) Dyspnea Scale is used to assess the degree of baseline functional disability in patients of respiratory disease due to dyspnea. No minimal important difference is established. A decrease in score of 1 point or greater is considered a positive change.   Pulmonary  Function Assessment:  Pulmonary Function Assessment - 08/23/23 1040       Breath   Bilateral Breath Sounds Clear    Shortness of Breath Yes;Limiting activity  Exercise Target Goals: Exercise Program Goal: Individual exercise prescription set using results from initial 6 min walk test and THRR while considering  patient's activity barriers and safety.   Exercise Prescription Goal: Initial exercise prescription builds to 30-45 minutes a day of aerobic activity, 2-3 days per week.  Home exercise guidelines will be given to patient during program as part of exercise prescription that the participant will acknowledge.  Activity Barriers & Risk Stratification:  Activity Barriers & Cardiac Risk Stratification - 08/23/23 1038       Activity Barriers & Cardiac Risk Stratification   Activity Barriers Arthritis;Deconditioning;Muscular Weakness;Shortness of Breath   needs right knee replacement   Cardiac Risk Stratification Moderate             6 Minute Walk:  6 Minute Walk     Row Name 08/23/23 1114         6 Minute Walk   Phase Initial     Distance 1129 feet     Walk Time 6 minutes     # of Rest Breaks 0     MPH 2.14     METS 2.12     RPE 13     Perceived Dyspnea  1     VO2 Peak 7.42     Symptoms No     Resting HR 67 bpm     Resting BP 118/58     Resting Oxygen Saturation  97 %     Exercise Oxygen Saturation  during 6 min walk 95 %     Max Ex. HR 94 bpm     Max Ex. BP 158/56     2 Minute Post BP 124/58       Interval HR   1 Minute HR 83     2 Minute HR 91     3 Minute HR 94     4 Minute HR 93     5 Minute HR 94     6 Minute HR 91     2 Minute Post HR 75     Interval Heart Rate? Yes       Interval Oxygen   Interval Oxygen? Yes     Baseline Oxygen Saturation % 97 %     1 Minute Oxygen Saturation % 96 %     1 Minute Liters of Oxygen 0 L     2 Minute Oxygen Saturation % 96 %     2 Minute Liters of Oxygen 0 L     3 Minute Oxygen Saturation %  96 %     3 Minute Liters of Oxygen 0 L     4 Minute Oxygen Saturation % 95 %     4 Minute Liters of Oxygen 0 L     5 Minute Oxygen Saturation % 95 %     5 Minute Liters of Oxygen 0 L     6 Minute Oxygen Saturation % 95 %     6 Minute Liters of Oxygen 0 L     2 Minute Post Oxygen Saturation % 98 %     2 Minute Post Liters of Oxygen 0 L              Oxygen Initial Assessment:  Oxygen Initial Assessment - 08/23/23 1039       Home Oxygen   Home Oxygen Device None    Sleep Oxygen Prescription CPAP    Home Exercise Oxygen Prescription None    Home Resting Oxygen Prescription None  Initial 6 min Walk   Oxygen Used None      Program Oxygen Prescription   Program Oxygen Prescription None      Intervention   Short Term Goals To learn and understand importance of monitoring SPO2 with pulse oximeter and demonstrate accurate use of the pulse oximeter.;To learn and understand importance of maintaining oxygen saturations>88%;To learn and demonstrate proper pursed lip breathing techniques or other breathing techniques.     Long  Term Goals Maintenance of O2 saturations>88%;Verbalizes importance of monitoring SPO2 with pulse oximeter and return demonstration;Exhibits proper breathing techniques, such as pursed lip breathing or other method taught during program session             Oxygen Re-Evaluation:  Oxygen Re-Evaluation     Row Name 08/23/23 1039             Goals/Expected Outcomes   Goals/Expected Outcomes Compliance and understanding of oxygen saturation monitoring and breathing techniques to decrease shortness of breath.                Oxygen Discharge (Final Oxygen Re-Evaluation):  Oxygen Re-Evaluation - 08/23/23 1039       Goals/Expected Outcomes   Goals/Expected Outcomes Compliance and understanding of oxygen saturation monitoring and breathing techniques to decrease shortness of breath.             Initial Exercise Prescription:  Initial  Exercise Prescription - 08/23/23 1100       Date of Initial Exercise RX and Referring Provider   Date 08/23/23    Referring Provider Turner    Expected Discharge Date 11/16/23      NuStep   Level 1    SPM 60    Minutes 15    METs 2      Recumbant Elliptical   Level 1    RPM 40    Watts 20    Minutes 15    METs 2      Prescription Details   Frequency (times per week) 2    Duration Progress to 30 minutes of continuous aerobic without signs/symptoms of physical distress      Intensity   THRR 40-80% of Max Heartrate 56-113    Ratings of Perceived Exertion 11-13    Perceived Dyspnea 0-4      Progression   Progression Continue to progress workloads to maintain intensity without signs/symptoms of physical distress.      Resistance Training   Training Prescription Yes    Weight blue bands    Reps 10-15             Perform Capillary Blood Glucose checks as needed.  Exercise Prescription Changes:   Exercise Prescription Changes     Row Name 09/05/23 1200             Response to Exercise   Blood Pressure (Admit) 112/58       Blood Pressure (Exercise) 180/60       Blood Pressure (Exit) 110/50       Heart Rate (Admit) 67 bpm       Heart Rate (Exercise) 93 bpm       Heart Rate (Exit) 71 bpm       Oxygen Saturation (Admit) 97 %       Oxygen Saturation (Exercise) 93 %       Oxygen Saturation (Exit) 97 %       Rating of Perceived Exertion (Exercise) 13       Perceived Dyspnea (Exercise) 1  Duration Continue with 30 min of aerobic exercise without signs/symptoms of physical distress.       Intensity THRR unchanged         Progression   Progression Continue to progress workloads to maintain intensity without signs/symptoms of physical distress.       Average METs 2.7         Resistance Training   Training Prescription Yes       Weight red bands       Reps 10-15       Time 10 Minutes         NuStep   Level 1       SPM 50       Minutes 15        METs 2.3         Recumbant Elliptical   Level 1       RPM 40       Minutes 15       METs 3.2                Exercise Comments:   Exercise Comments     Row Name 09/05/23 1208           Exercise Comments Pt completed first day of group exercise. She exercised on the recumbent elliptical for 15 min, level 1, METs 3.2. she then exercised on the recumbent stepper for 15 min, level 1, METs 2.3. Pt tolerated well and was able to perform warm up and cooldown exercises standing, including squats. Discussed METs with good reception.                Exercise Goals and Review:   Exercise Goals     Row Name 08/23/23 1039             Exercise Goals   Increase Physical Activity Yes       Intervention Provide advice, education, support and counseling about physical activity/exercise needs.;Develop an individualized exercise prescription for aerobic and resistive training based on initial evaluation findings, risk stratification, comorbidities and participant's personal goals.       Expected Outcomes Short Term: Attend rehab on a regular basis to increase amount of physical activity.;Long Term: Add in home exercise to make exercise part of routine and to increase amount of physical activity.;Long Term: Exercising regularly at least 3-5 days a week.       Increase Strength and Stamina Yes       Intervention Provide advice, education, support and counseling about physical activity/exercise needs.;Develop an individualized exercise prescription for aerobic and resistive training based on initial evaluation findings, risk stratification, comorbidities and participant's personal goals.       Expected Outcomes Short Term: Increase workloads from initial exercise prescription for resistance, speed, and METs.;Short Term: Perform resistance training exercises routinely during rehab and add in resistance training at home;Long Term: Improve cardiorespiratory fitness, muscular endurance and strength  as measured by increased METs and functional capacity ( )       Able to understand and use rate of perceived exertion (RPE) scale Yes       Intervention Provide education and explanation on how to use RPE scale       Expected Outcomes Short Term: Able to use RPE daily in rehab to express subjective intensity level;Long Term:  Able to use RPE to guide intensity level when exercising independently       Able to understand and use Dyspnea scale Yes       Intervention  Provide education and explanation on how to use Dyspnea scale       Expected Outcomes Short Term: Able to use Dyspnea scale daily in rehab to express subjective sense of shortness of breath during exertion;Long Term: Able to use Dyspnea scale to guide intensity level when exercising independently       Knowledge and understanding of Target Heart Rate Range (THRR) Yes       Intervention Provide education and explanation of THRR including how the numbers were predicted and where they are located for reference       Expected Outcomes Short Term: Able to state/look up THRR;Long Term: Able to use THRR to govern intensity when exercising independently;Short Term: Able to use daily as guideline for intensity in rehab       Understanding of Exercise Prescription Yes       Intervention Provide education, explanation, and written materials on patient's individual exercise prescription       Expected Outcomes Short Term: Able to explain program exercise prescription;Long Term: Able to explain home exercise prescription to exercise independently                Exercise Goals Re-Evaluation :  Exercise Goals Re-Evaluation     Row Name 08/28/23 0735             Exercise Goal Re-Evaluation   Exercise Goals Review Increase Physical Activity;Able to understand and use Dyspnea scale;Understanding of Exercise Prescription;Increase Strength and Stamina;Knowledge and understanding of Target Heart Rate Range (THRR);Able to understand and use rate  of perceived exertion (RPE) scale       Comments [name] is scheduled to start exercise on 12/24. Will continue to monitor and progress as able.       Expected Outcomes Through exercise at rehab and home, the patient will decrease shortness of breath with daily activities and feel confident in carrying out an exercise regimen at home.                Discharge Exercise Prescription (Final Exercise Prescription Changes):  Exercise Prescription Changes - 09/05/23 1200       Response to Exercise   Blood Pressure (Admit) 112/58    Blood Pressure (Exercise) 180/60    Blood Pressure (Exit) 110/50    Heart Rate (Admit) 67 bpm    Heart Rate (Exercise) 93 bpm    Heart Rate (Exit) 71 bpm    Oxygen Saturation (Admit) 97 %    Oxygen Saturation (Exercise) 93 %    Oxygen Saturation (Exit) 97 %    Rating of Perceived Exertion (Exercise) 13    Perceived Dyspnea (Exercise) 1    Duration Continue with 30 min of aerobic exercise without signs/symptoms of physical distress.    Intensity THRR unchanged      Progression   Progression Continue to progress workloads to maintain intensity without signs/symptoms of physical distress.    Average METs 2.7      Resistance Training   Training Prescription Yes    Weight red bands    Reps 10-15    Time 10 Minutes      NuStep   Level 1    SPM 50    Minutes 15    METs 2.3      Recumbant Elliptical   Level 1    RPM 40    Minutes 15    METs 3.2             Nutrition:  Target Goals: Understanding of nutrition guidelines, daily  intake of sodium 1500mg , cholesterol 200mg , calories 30% from fat and 7% or less from saturated fats, daily to have 5 or more servings of fruits and vegetables.  Biometrics:  Pre Biometrics - 08/23/23 1024       Pre Biometrics   Grip Strength 30 kg              Nutrition Therapy Plan and Nutrition Goals:   Nutrition Assessments:  MEDIFICTS Score Key: >=70 Need to make dietary changes  40-70 Heart  Healthy Diet <= 40 Therapeutic Level Cholesterol Diet   Picture Your Plate Scores: <95 Unhealthy dietary pattern with much room for improvement. 41-50 Dietary pattern unlikely to meet recommendations for good health and room for improvement. 51-60 More healthful dietary pattern, with some room for improvement.  >60 Healthy dietary pattern, although there may be some specific behaviors that could be improved.    Nutrition Goals Re-Evaluation:   Nutrition Goals Discharge (Final Nutrition Goals Re-Evaluation):   Psychosocial: Target Goals: Acknowledge presence or absence of significant depression and/or stress, maximize coping skills, provide positive support system. Participant is able to verbalize types and ability to use techniques and skills needed for reducing stress and depression.  Initial Review & Psychosocial Screening:  Initial Psych Review & Screening - 08/23/23 1036       Initial Review   Current issues with None Identified      Family Dynamics   Good Support System? Yes    Comments Has friends and family as her support      Barriers   Psychosocial barriers to participate in program There are no identifiable barriers or psychosocial needs.      Screening Interventions   Interventions Encouraged to exercise             Quality of Life Scores:  Scores of 19 and below usually indicate a poorer quality of life in these areas.  A difference of  2-3 points is a clinically meaningful difference.  A difference of 2-3 points in the total score of the Quality of Life Index has been associated with significant improvement in overall quality of life, self-image, physical symptoms, and general health in studies assessing change in quality of life.  PHQ-9: Review Flowsheet  More data exists      08/23/2023 07/14/2023 05/24/2023 04/28/2023 04/11/2023  Depression screen PHQ 2/9  Decreased Interest 0 0 0 0 0  Down, Depressed, Hopeless 0 0 0 0 0  PHQ - 2 Score 0 0 0 0 0   Altered sleeping 0 - - - -  Tired, decreased energy 1 - - - -  Change in appetite 0 - - - -  Feeling bad or failure about yourself  0 - - - -  Trouble concentrating 0 - - - -  Moving slowly or fidgety/restless 0 - - - -  Suicidal thoughts 0 - - - -  PHQ-9 Score 1 - - - -  Difficult doing work/chores Not difficult at all - - - -   Interpretation of Total Score  Total Score Depression Severity:  1-4 = Minimal depression, 5-9 = Mild depression, 10-14 = Moderate depression, 15-19 = Moderately severe depression, 20-27 = Severe depression   Psychosocial Evaluation and Intervention:  Psychosocial Evaluation - 08/23/23 1037       Psychosocial Evaluation & Interventions   Interventions Encouraged to exercise with the program and follow exercise prescription    Comments [name] denies any psychosocial barriers or concerns at this time.  Expected Outcomes For [name] to participate in PR free of any psychosocial barriers or concerns    Continue Psychosocial Services  No Follow up required             Psychosocial Re-Evaluation:  Psychosocial Re-Evaluation     Row Name 09/01/23 [phone]             Psychosocial Re-Evaluation   Current issues with None Identified       Comments Pt is scheduled to start the PR program on 09/05/23. No new barriers or concerns since orientation on 12/11.       Expected Outcomes For [name] to participate in PR free of any psychosocial barriers or concerns.       Interventions Encouraged to attend Pulmonary Rehabilitation for the exercise       Continue Psychosocial Services  No Follow up required                Psychosocial Discharge (Final Psychosocial Re-Evaluation):  Psychosocial Re-Evaluation - 09/01/23 0955       Psychosocial Re-Evaluation   Current issues with None Identified    Comments Pt is scheduled to start the PR program on 09/05/23. No new barriers or concerns since orientation on 12/11.    Expected Outcomes For [name] to  participate in PR free of any psychosocial barriers or concerns.    Interventions Encouraged to attend Pulmonary Rehabilitation for the exercise    Continue Psychosocial Services  No Follow up required             Education: Education Goals: Education classes will be provided on a weekly basis, covering required topics. Participant will state understanding/return demonstration of topics presented.  Learning Barriers/Preferences:  Learning Barriers/Preferences - 08/23/23 1037       Learning Barriers/Preferences   Learning Barriers None    Learning Preferences Written Material;Skilled Demonstration             Education Topics: Know Your Numbers Group instruction that is supported by a PowerPoint presentation. Instructor discusses importance of knowing and understanding resting, exercise, and post-exercise oxygen saturation, heart rate, and blood pressure. Oxygen saturation, heart rate, blood pressure, rating of perceived exertion, and dyspnea are reviewed along with a normal range for these values.    Exercise for the Pulmonary Patient Group instruction that is supported by a PowerPoint presentation. Instructor discusses benefits of exercise, core components of exercise, frequency, duration, and intensity of an exercise routine, importance of utilizing pulse oximetry during exercise, safety while exercising, and options of places to exercise outside of rehab.    MET Level  Group instruction provided by PowerPoint, verbal discussion, and written material to support subject matter. Instructor reviews what METs are and how to increase METs.    Pulmonary Medications Verbally interactive group education provided by instructor with focus on inhaled medications and proper administration.   Anatomy and Physiology of the Respiratory System Group instruction provided by PowerPoint, verbal discussion, and written material to support subject matter. Instructor reviews respiratory  cycle and anatomical components of the respiratory system and their functions. Instructor also reviews differences in obstructive and restrictive respiratory diseases with examples of each.    Oxygen Safety Group instruction provided by PowerPoint, verbal discussion, and written material to support subject matter. There is an overview of "What is Oxygen" and "Why do we need it".  Instructor also reviews how to create a safe environment for oxygen use, the importance of using oxygen as prescribed, and the risks of noncompliance. There is  a brief discussion on traveling with oxygen and resources the patient may utilize.   Oxygen Use Group instruction provided by PowerPoint, verbal discussion, and written material to discuss how supplemental oxygen is prescribed and different types of oxygen supply systems. Resources for more information are provided.    Breathing Techniques Group instruction that is supported by demonstration and informational handouts. Instructor discusses the benefits of pursed lip and diaphragmatic breathing and detailed demonstration on how to perform both.     Risk Factor Reduction Group instruction that is supported by a PowerPoint presentation. Instructor discusses the definition of a risk factor, different risk factors for pulmonary disease, and how the heart and lungs work together.   Pulmonary Diseases Group instruction provided by PowerPoint, verbal discussion, and written material to support subject matter. Instructor gives an overview of the different type of pulmonary diseases. There is also a discussion on risk factors and symptoms as well as ways to manage the diseases.   Stress and Energy Conservation Group instruction provided by PowerPoint, verbal discussion, and written material to support subject matter. Instructor gives an overview of stress and the impact it can have on the body. Instructor also reviews ways to reduce stress. There is also a discussion on  energy conservation and ways to conserve energy throughout the day.   Warning Signs and Symptoms Group instruction provided by PowerPoint, verbal discussion, and written material to support subject matter. Instructor reviews warning signs and symptoms of stroke, heart attack, cold and flu. Instructor also reviews ways to prevent the spread of infection.   Other Education Group or individual verbal, written, or video instructions that support the educational goals of the pulmonary rehab program.    Knowledge Questionnaire Score:  Knowledge Questionnaire Score - 08/23/23 1110       Knowledge Questionnaire Score   Pre Score 16/18             Core Components/Risk Factors/Patient Goals at Admission:  Personal Goals and Risk Factors at Admission - 08/23/23 1038       Core Components/Risk Factors/Patient Goals on Admission   Improve shortness of breath with ADL's Yes    Intervention Provide education, individualized exercise plan and daily activity instruction to help decrease symptoms of SOB with activities of daily living.    Expected Outcomes Short Term: Improve cardiorespiratory fitness to achieve a reduction of symptoms when performing ADLs;Long Term: Be able to perform more ADLs without symptoms or delay the onset of symptoms             Core Components/Risk Factors/Patient Goals Review:   Goals and Risk Factor Review     Row Name 09/01/23 0957             Core Components/Risk Factors/Patient Goals Review   Personal Goals Review Improve shortness of breath with ADL's;Develop more efficient breathing techniques such as purse lipped breathing and diaphragmatic breathing and practicing self-pacing with activity.       Review [name] is scheduled to start the PR program on 09/05/23. Goal progressing for improving shortness of breath with ADL's. Goal progressing for developing more efficient breathing techniques such as purse lipped breathing and diaphragmatic breathing;  and practicing self-pacing with activity.       Expected Outcomes To improve shortness of breath with ADL's and develop more efficient breathing techniques such as purse lipped breathing and diaphragmatic breathing; and practicing self-pacing with activity.                Core Components/Risk  Factors/Patient Goals at Discharge (Final Review):   Goals and Risk Factor Review - 09/01/23 0957       Core Components/Risk Factors/Patient Goals Review   Personal Goals Review Improve shortness of breath with ADL's;Develop more efficient breathing techniques such as purse lipped breathing and diaphragmatic breathing and practicing self-pacing with activity.    Review [name] is scheduled to start the PR program on 09/05/23. Goal progressing for improving shortness of breath with ADL's. Goal progressing for developing more efficient breathing techniques such as purse lipped breathing and diaphragmatic breathing; and practicing self-pacing with activity.    Expected Outcomes To improve shortness of breath with ADL's and develop more efficient breathing techniques such as purse lipped breathing and diaphragmatic breathing; and practicing self-pacing with activity.             ITP Comments: Pt is making expected progress toward Pulmonary Rehab goals after completing 1 session(s). Recommend continued exercise, life style modification, education, and utilization of breathing techniques to increase stamina and strength, while also decreasing shortness of breath with exertion.  Dr. Mechele Collin is Medical Director for Pulmonary Rehab at [facility].

## 2023-09-05 NOTE — Progress Notes (Signed)
Pulmonary Individual Treatment Plan  Patient Details  Name: Raine Bloyer MRN: [id number] Date of Birth: 1946-04-11 Referring Provider:   Doristine Devoid Pulmonary Rehab Walk Test from 08/23/2023 in [facility]  Referring Provider Turner       Initial Encounter Date:  Flowsheet Row Pulmonary Rehab Walk Test from 08/23/2023 in [facility]  Date 08/23/23       Visit Diagnosis: Pulmonary hypertension (HCC)  Patient's Home Medications on Admission:   Current Outpatient Medications:    aspirin EC 81 MG tablet, Take 1 tablet (81 mg total) by mouth daily. Swallow whole., Disp: 30 tablet, Rfl: 12   diltiazem (CARDIZEM CD) 300 MG 24 hr capsule, Take 1 capsule (300 mg total) by mouth daily., Disp: 90 capsule, Rfl: 3   furosemide (LASIX) 40 MG tablet, Take 40 mg by mouth daily., Disp: , Rfl:    lisinopril (ZESTRIL) 10 MG tablet, Take 10 mg by mouth daily., Disp: , Rfl:    Prenatal Vit-Fe Fumarate-FA (PRENATAL VITAMIN PO), Take by mouth daily. Prenatal w/folic acid po daily, Disp: , Rfl:    Probiotic Product (ADVANCED PROBIOTIC PO), Take 1 capsule by mouth daily., Disp: , Rfl:    rosuvastatin (CRESTOR) 40 MG tablet, TAKE 1 TABLET (40 MG TOTAL) BY MOUTH AT BEDTIME., Disp: 90 tablet, Rfl: 3  Past Medical History: Past Medical History:  Diagnosis Date   Allergy 1990   Morphine following surgery   Arthritis    generalized   Atypical mole 02/24/2015   RGHT LATERAL THIGH MODERATE   Atypical mole 01/20/2022   Left Breast (mild)   Atypical mole 01/20/2022   Mid Back (mild)   Atypical nevi 02/24/2015   RIGHT NECK MILD   Atypical nevi 08/25/2015   LEFT UPPER ARM MILD   Atypical nevi 08/25/2015   LEFT LATERAL FOREARM MILD/FREE   Atypical nevi 02/23/2016   LEFT THIGH MODERATE/FREE   Atypical nevi 02/23/2016   LEFT MEDIAL LEG MODERATE/FREE   Atypical nevi 02/23/2016    LEFT UPPER BACK MILD /FREE   Atypical nevi 08/23/2016   RIGHT ANT PROX THIGH MILD /FREE   Atypical nevi 07/25/2018   MID LOWER BACK MODERATE   Atypical nevi 01/22/2019   LEFT MID BACK MILD /FREE   Atypical nevi 01/22/2019   LEFT NECK MILD   Atypical nevi 08/06/2019   LEFT INNER BREAST MODERATE W/S   Atypical nevi 08/06/2019   LEFT OUTER SIDE MILD/FREE   Blood transfusion without reported diagnosis    hx of   Breast cancer (HCC) 2022   CAD (coronary artery disease), native coronary artery 02/07/2019   Minimal CAD at the time of cath in 2016 in [facility] [place]   Family history of breast cancer 08/19/2021   Family history of pancreatic cancer 08/19/2021   Fatty liver 05/21/2020   Gastric polyps 10/02/2018   EGD 03/2015; benign   GERD (gastroesophageal reflux disease) 10/02/2018   on meds   History of melanoma 10/02/2018   Abdomen 2012; 2013; s/p local excisions.    Hyperlipidemia    on meds   Hypertension    on meds   Melanoma (HCC) 2012   MM- central lower abdomen- (ECX) [facility]   Osteopenia    Personal history of radiation therapy    Polyp of colon, adenomatous 10/02/2018   Colonoscopy 03/2015; recheck in 2021   PONV (postoperative nausea and vomiting)  Pulmonary hypertension, primary (HCC) 10/02/2018   SCCA (squamous cell carcinoma) of skin 09/18/2017   RIGHT ANT. DISTAL LOWER LEG TREATED BY DR. Leda Quail   Sleep apnea    Sleep apnea 05/26/2020   uses CPAP   Tricuspid regurgitation    moderate by echo 04/2023    Tobacco Use: Social History   Tobacco Use  Smoking Status Former   Current packs/day: 0.00   Average packs/day: 0.3 packs/day for 10.0 years (2.5 ttl pk-yrs)   Types: Cigarettes   Start date: 09/12/1986   Quit date: 09/12/1996   Years since quitting: 26.9  Smokeless Tobacco Never    Labs: Review Flowsheet  More data exists      Latest Ref Rng & Units 03/09/2021 07/09/2021 05/18/2022 03/08/2023 06/20/2023  Labs for ITP Cardiac  and Pulmonary Rehab  Cholestrol 100 - 199 mg/dL 478  295  621  308  -  LDL (calc) 0 - 99 mg/dL 55  66  63  61  -  HDL-C >39 mg/dL 66  65.78  48  55  -  Trlycerides 0 - 149 mg/dL 469  629.5  284  132  -  Bicarbonate 20.0 - 28.0 mmol/L - - - - 21.6  21.9   TCO2 22 - 32 mmol/L - - - - 23  23   Acid-base deficit 0.0 - 2.0 mmol/L - - - - 3.0  3.0   O2 Saturation % - - - - 62  62     Details       Multiple values from one day are sorted in reverse-chronological order         Capillary Blood Glucose: No results found for: "GLUCAP"   Pulmonary Assessment Scores:  Pulmonary Assessment Scores     Row Name 08/23/23 1110         ADL UCSD   ADL Phase Entry     SOB Score total 53       CAT Score   CAT Score 17       mMRC Score   mMRC Score 4             UCSD: Self-administered rating of dyspnea associated with activities of daily living (ADLs) 6-point scale (0 = "not at all" to 5 = "maximal or unable to do because of breathlessness")  Scoring Scores range from 0 to 120.  Minimally important difference is 5 units  CAT: CAT can identify the health impairment of COPD patients and is better correlated with disease progression.  CAT has a scoring range of zero to 40. The CAT score is classified into four groups of low (less than 10), medium (10 - 20), high (21-30) and very high (31-40) based on the impact level of disease on health status. A CAT score over 10 suggests significant symptoms.  A worsening CAT score could be explained by an exacerbation, poor medication adherence, poor inhaler technique, or progression of COPD or comorbid conditions.  CAT MCID is 2 points  mMRC: mMRC (Modified Medical Research Council) Dyspnea Scale is used to assess the degree of baseline functional disability in patients of respiratory disease due to dyspnea. No minimal important difference is established. A decrease in score of 1 point or greater is considered a positive change.   Pulmonary  Function Assessment:  Pulmonary Function Assessment - 08/23/23 1040       Breath   Bilateral Breath Sounds Clear    Shortness of Breath Yes;Limiting activity  Exercise Target Goals: Exercise Program Goal: Individual exercise prescription set using results from initial 6 min walk test and THRR while considering  patient's activity barriers and safety.   Exercise Prescription Goal: Initial exercise prescription builds to 30-45 minutes a day of aerobic activity, 2-3 days per week.  Home exercise guidelines will be given to patient during program as part of exercise prescription that the participant will acknowledge.  Activity Barriers & Risk Stratification:  Activity Barriers & Cardiac Risk Stratification - 08/23/23 1038       Activity Barriers & Cardiac Risk Stratification   Activity Barriers Arthritis;Deconditioning;Muscular Weakness;Shortness of Breath   needs right knee replacement   Cardiac Risk Stratification Moderate             6 Minute Walk:  6 Minute Walk     Row Name 08/23/23 1114         6 Minute Walk   Phase Initial     Distance 1129 feet     Walk Time 6 minutes     # of Rest Breaks 0     MPH 2.14     METS 2.12     RPE 13     Perceived Dyspnea  1     VO2 Peak 7.42     Symptoms No     Resting HR 67 bpm     Resting BP 118/58     Resting Oxygen Saturation  97 %     Exercise Oxygen Saturation  during 6 min walk 95 %     Max Ex. HR 94 bpm     Max Ex. BP 158/56     2 Minute Post BP 124/58       Interval HR   1 Minute HR 83     2 Minute HR 91     3 Minute HR 94     4 Minute HR 93     5 Minute HR 94     6 Minute HR 91     2 Minute Post HR 75     Interval Heart Rate? Yes       Interval Oxygen   Interval Oxygen? Yes     Baseline Oxygen Saturation % 97 %     1 Minute Oxygen Saturation % 96 %     1 Minute Liters of Oxygen 0 L     2 Minute Oxygen Saturation % 96 %     2 Minute Liters of Oxygen 0 L     3 Minute Oxygen Saturation %  96 %     3 Minute Liters of Oxygen 0 L     4 Minute Oxygen Saturation % 95 %     4 Minute Liters of Oxygen 0 L     5 Minute Oxygen Saturation % 95 %     5 Minute Liters of Oxygen 0 L     6 Minute Oxygen Saturation % 95 %     6 Minute Liters of Oxygen 0 L     2 Minute Post Oxygen Saturation % 98 %     2 Minute Post Liters of Oxygen 0 L              Oxygen Initial Assessment:  Oxygen Initial Assessment - 08/23/23 1039       Home Oxygen   Home Oxygen Device None    Sleep Oxygen Prescription CPAP    Home Exercise Oxygen Prescription None    Home Resting Oxygen Prescription None  Initial 6 min Walk   Oxygen Used None      Program Oxygen Prescription   Program Oxygen Prescription None      Intervention   Short Term Goals To learn and understand importance of monitoring SPO2 with pulse oximeter and demonstrate accurate use of the pulse oximeter.;To learn and understand importance of maintaining oxygen saturations>88%;To learn and demonstrate proper pursed lip breathing techniques or other breathing techniques.     Long  Term Goals Maintenance of O2 saturations>88%;Verbalizes importance of monitoring SPO2 with pulse oximeter and return demonstration;Exhibits proper breathing techniques, such as pursed lip breathing or other method taught during program session             Oxygen Re-Evaluation:  Oxygen Re-Evaluation     Row Name 08/23/23 1039             Goals/Expected Outcomes   Goals/Expected Outcomes Compliance and understanding of oxygen saturation monitoring and breathing techniques to decrease shortness of breath.                Oxygen Discharge (Final Oxygen Re-Evaluation):  Oxygen Re-Evaluation - 08/23/23 1039       Goals/Expected Outcomes   Goals/Expected Outcomes Compliance and understanding of oxygen saturation monitoring and breathing techniques to decrease shortness of breath.             Initial Exercise Prescription:  Initial  Exercise Prescription - 08/23/23 1100       Date of Initial Exercise RX and Referring Provider   Date 08/23/23    Referring Provider Turner    Expected Discharge Date 11/16/23      NuStep   Level 1    SPM 60    Minutes 15    METs 2      Recumbant Elliptical   Level 1    RPM 40    Watts 20    Minutes 15    METs 2      Prescription Details   Frequency (times per week) 2    Duration Progress to 30 minutes of continuous aerobic without signs/symptoms of physical distress      Intensity   THRR 40-80% of Max Heartrate 56-113    Ratings of Perceived Exertion 11-13    Perceived Dyspnea 0-4      Progression   Progression Continue to progress workloads to maintain intensity without signs/symptoms of physical distress.      Resistance Training   Training Prescription Yes    Weight blue bands    Reps 10-15             Perform Capillary Blood Glucose checks as needed.  Exercise Prescription Changes:   Exercise Comments:   Exercise Goals and Review:   Exercise Goals     Row Name 08/23/23 1039             Exercise Goals   Increase Physical Activity Yes       Intervention Provide advice, education, support and counseling about physical activity/exercise needs.;Develop an individualized exercise prescription for aerobic and resistive training based on initial evaluation findings, risk stratification, comorbidities and participant's personal goals.       Expected Outcomes Short Term: Attend rehab on a regular basis to increase amount of physical activity.;Long Term: Add in home exercise to make exercise part of routine and to increase amount of physical activity.;Long Term: Exercising regularly at least 3-5 days a week.       Increase Strength and Stamina Yes  Intervention Provide advice, education, support and counseling about physical activity/exercise needs.;Develop an individualized exercise prescription for aerobic and resistive training based on initial  evaluation findings, risk stratification, comorbidities and participant's personal goals.       Expected Outcomes Short Term: Increase workloads from initial exercise prescription for resistance, speed, and METs.;Short Term: Perform resistance training exercises routinely during rehab and add in resistance training at home;Long Term: Improve cardiorespiratory fitness, muscular endurance and strength as measured by increased METs and functional capacity ( )       Able to understand and use rate of perceived exertion (RPE) scale Yes       Intervention Provide education and explanation on how to use RPE scale       Expected Outcomes Short Term: Able to use RPE daily in rehab to express subjective intensity level;Long Term:  Able to use RPE to guide intensity level when exercising independently       Able to understand and use Dyspnea scale Yes       Intervention Provide education and explanation on how to use Dyspnea scale       Expected Outcomes Short Term: Able to use Dyspnea scale daily in rehab to express subjective sense of shortness of breath during exertion;Long Term: Able to use Dyspnea scale to guide intensity level when exercising independently       Knowledge and understanding of Target Heart Rate Range (THRR) Yes       Intervention Provide education and explanation of THRR including how the numbers were predicted and where they are located for reference       Expected Outcomes Short Term: Able to state/look up THRR;Long Term: Able to use THRR to govern intensity when exercising independently;Short Term: Able to use daily as guideline for intensity in rehab       Understanding of Exercise Prescription Yes       Intervention Provide education, explanation, and written materials on patient's individual exercise prescription       Expected Outcomes Short Term: Able to explain program exercise prescription;Long Term: Able to explain home exercise prescription to exercise independently                 Exercise Goals Re-Evaluation :  Exercise Goals Re-Evaluation     Row Name 08/28/23 0735             Exercise Goal Re-Evaluation   Exercise Goals Review Increase Physical Activity;Able to understand and use Dyspnea scale;Understanding of Exercise Prescription;Increase Strength and Stamina;Knowledge and understanding of Target Heart Rate Range (THRR);Able to understand and use rate of perceived exertion (RPE) scale       Comments [name] is scheduled to start exercise on 12/24. Will continue to monitor and progress as able.       Expected Outcomes Through exercise at rehab and home, the patient will decrease shortness of breath with daily activities and feel confident in carrying out an exercise regimen at home.                Discharge Exercise Prescription (Final Exercise Prescription Changes):   Nutrition:  Target Goals: Understanding of nutrition guidelines, daily intake of sodium 1500mg , cholesterol 200mg , calories 30% from fat and 7% or less from saturated fats, daily to have 5 or more servings of fruits and vegetables.  Biometrics:  Pre Biometrics - 08/23/23 1024       Pre Biometrics   Grip Strength 30 kg  Nutrition Therapy Plan and Nutrition Goals:   Nutrition Assessments:  MEDIFICTS Score Key: >=70 Need to make dietary changes  40-70 Heart Healthy Diet <= 40 Therapeutic Level Cholesterol Diet   Picture Your Plate Scores: <62 Unhealthy dietary pattern with much room for improvement. 41-50 Dietary pattern unlikely to meet recommendations for good health and room for improvement. 51-60 More healthful dietary pattern, with some room for improvement.  >60 Healthy dietary pattern, although there may be some specific behaviors that could be improved.    Nutrition Goals Re-Evaluation:   Nutrition Goals Discharge (Final Nutrition Goals Re-Evaluation):   Psychosocial: Target Goals: Acknowledge presence or absence of significant  depression and/or stress, maximize coping skills, provide positive support system. Participant is able to verbalize types and ability to use techniques and skills needed for reducing stress and depression.  Initial Review & Psychosocial Screening:  Initial Psych Review & Screening - 08/23/23 1036       Initial Review   Current issues with None Identified      Family Dynamics   Good Support System? Yes    Comments Has friends and family as her support      Barriers   Psychosocial barriers to participate in program There are no identifiable barriers or psychosocial needs.      Screening Interventions   Interventions Encouraged to exercise             Quality of Life Scores:  Scores of 19 and below usually indicate a poorer quality of life in these areas.  A difference of  2-3 points is a clinically meaningful difference.  A difference of 2-3 points in the total score of the Quality of Life Index has been associated with significant improvement in overall quality of life, self-image, physical symptoms, and general health in studies assessing change in quality of life.  PHQ-9: Review Flowsheet  More data exists      08/23/2023 07/14/2023 05/24/2023 04/28/2023 04/11/2023  Depression screen PHQ 2/9  Decreased Interest 0 0 0 0 0  Down, Depressed, Hopeless 0 0 0 0 0  PHQ - 2 Score 0 0 0 0 0  Altered sleeping 0 - - - -  Tired, decreased energy 1 - - - -  Change in appetite 0 - - - -  Feeling bad or failure about yourself  0 - - - -  Trouble concentrating 0 - - - -  Moving slowly or fidgety/restless 0 - - - -  Suicidal thoughts 0 - - - -  PHQ-9 Score 1 - - - -  Difficult doing work/chores Not difficult at all - - - -   Interpretation of Total Score  Total Score Depression Severity:  1-4 = Minimal depression, 5-9 = Mild depression, 10-14 = Moderate depression, 15-19 = Moderately severe depression, 20-27 = Severe depression   Psychosocial Evaluation and Intervention:   Psychosocial Evaluation - 08/23/23 1037       Psychosocial Evaluation & Interventions   Interventions Encouraged to exercise with the program and follow exercise prescription    Comments [name] denies any psychosocial barriers or concerns at this time.    Expected Outcomes For [name] to participate in PR free of any psychosocial barriers or concerns    Continue Psychosocial Services  No Follow up required             Psychosocial Re-Evaluation:  Psychosocial Re-Evaluation     Row Name 09/01/23 [phone]  Psychosocial Re-Evaluation   Current issues with None Identified       Comments Pt is scheduled to start the PR program on 09/05/23. No new barriers or concerns since orientation on 12/11.       Expected Outcomes For [name] to participate in PR free of any psychosocial barriers or concerns.       Interventions Encouraged to attend Pulmonary Rehabilitation for the exercise       Continue Psychosocial Services  No Follow up required                Psychosocial Discharge (Final Psychosocial Re-Evaluation):  Psychosocial Re-Evaluation - 09/01/23 0955       Psychosocial Re-Evaluation   Current issues with None Identified    Comments Pt is scheduled to start the PR program on 09/05/23. No new barriers or concerns since orientation on 12/11.    Expected Outcomes For [name] to participate in PR free of any psychosocial barriers or concerns.    Interventions Encouraged to attend Pulmonary Rehabilitation for the exercise    Continue Psychosocial Services  No Follow up required             Education: Education Goals: Education classes will be provided on a weekly basis, covering required topics. Participant will state understanding/return demonstration of topics presented.  Learning Barriers/Preferences:  Learning Barriers/Preferences - 08/23/23 1037       Learning Barriers/Preferences   Learning Barriers None    Learning Preferences Written Material;Skilled  Demonstration             Education Topics: Know Your Numbers Group instruction that is supported by a PowerPoint presentation. Instructor discusses importance of knowing and understanding resting, exercise, and post-exercise oxygen saturation, heart rate, and blood pressure. Oxygen saturation, heart rate, blood pressure, rating of perceived exertion, and dyspnea are reviewed along with a normal range for these values.    Exercise for the Pulmonary Patient Group instruction that is supported by a PowerPoint presentation. Instructor discusses benefits of exercise, core components of exercise, frequency, duration, and intensity of an exercise routine, importance of utilizing pulse oximetry during exercise, safety while exercising, and options of places to exercise outside of rehab.    MET Level  Group instruction provided by PowerPoint, verbal discussion, and written material to support subject matter. Instructor reviews what METs are and how to increase METs.    Pulmonary Medications Verbally interactive group education provided by instructor with focus on inhaled medications and proper administration.   Anatomy and Physiology of the Respiratory System Group instruction provided by PowerPoint, verbal discussion, and written material to support subject matter. Instructor reviews respiratory cycle and anatomical components of the respiratory system and their functions. Instructor also reviews differences in obstructive and restrictive respiratory diseases with examples of each.    Oxygen Safety Group instruction provided by PowerPoint, verbal discussion, and written material to support subject matter. There is an overview of "What is Oxygen" and "Why do we need it".  Instructor also reviews how to create a safe environment for oxygen use, the importance of using oxygen as prescribed, and the risks of noncompliance. There is a brief discussion on traveling with oxygen and resources the  patient may utilize.   Oxygen Use Group instruction provided by PowerPoint, verbal discussion, and written material to discuss how supplemental oxygen is prescribed and different types of oxygen supply systems. Resources for more information are provided.    Breathing Techniques Group instruction that is supported by demonstration and informational handouts. Instructor  discusses the benefits of pursed lip and diaphragmatic breathing and detailed demonstration on how to perform both.     Risk Factor Reduction Group instruction that is supported by a PowerPoint presentation. Instructor discusses the definition of a risk factor, different risk factors for pulmonary disease, and how the heart and lungs work together.   Pulmonary Diseases Group instruction provided by PowerPoint, verbal discussion, and written material to support subject matter. Instructor gives an overview of the different type of pulmonary diseases. There is also a discussion on risk factors and symptoms as well as ways to manage the diseases.   Stress and Energy Conservation Group instruction provided by PowerPoint, verbal discussion, and written material to support subject matter. Instructor gives an overview of stress and the impact it can have on the body. Instructor also reviews ways to reduce stress. There is also a discussion on energy conservation and ways to conserve energy throughout the day.   Warning Signs and Symptoms Group instruction provided by PowerPoint, verbal discussion, and written material to support subject matter. Instructor reviews warning signs and symptoms of stroke, heart attack, cold and flu. Instructor also reviews ways to prevent the spread of infection.   Other Education Group or individual verbal, written, or video instructions that support the educational goals of the pulmonary rehab program.    Knowledge Questionnaire Score:  Knowledge Questionnaire Score - 08/23/23 1110        Knowledge Questionnaire Score   Pre Score 16/18             Core Components/Risk Factors/Patient Goals at Admission:  Personal Goals and Risk Factors at Admission - 08/23/23 1038       Core Components/Risk Factors/Patient Goals on Admission   Improve shortness of breath with ADL's Yes    Intervention Provide education, individualized exercise plan and daily activity instruction to help decrease symptoms of SOB with activities of daily living.    Expected Outcomes Short Term: Improve cardiorespiratory fitness to achieve a reduction of symptoms when performing ADLs;Long Term: Be able to perform more ADLs without symptoms or delay the onset of symptoms             Core Components/Risk Factors/Patient Goals Review:   Goals and Risk Factor Review     Row Name 09/01/23 0957             Core Components/Risk Factors/Patient Goals Review   Personal Goals Review Improve shortness of breath with ADL's;Develop more efficient breathing techniques such as purse lipped breathing and diaphragmatic breathing and practicing self-pacing with activity.       Review [name] is scheduled to start the PR program on 09/05/23. Goal progressing for improving shortness of breath with ADL's. Goal progressing for developing more efficient breathing techniques such as purse lipped breathing and diaphragmatic breathing; and practicing self-pacing with activity.       Expected Outcomes To improve shortness of breath with ADL's and develop more efficient breathing techniques such as purse lipped breathing and diaphragmatic breathing; and practicing self-pacing with activity.                Core Components/Risk Factors/Patient Goals at Discharge (Final Review):   Goals and Risk Factor Review - 09/01/23 0957       Core Components/Risk Factors/Patient Goals Review   Personal Goals Review Improve shortness of breath with ADL's;Develop more efficient breathing techniques such as purse lipped breathing and  diaphragmatic breathing and practicing self-pacing with activity.    Review [name] is scheduled to  start the PR program on 09/05/23. Goal progressing for improving shortness of breath with ADL's. Goal progressing for developing more efficient breathing techniques such as purse lipped breathing and diaphragmatic breathing; and practicing self-pacing with activity.    Expected Outcomes To improve shortness of breath with ADL's and develop more efficient breathing techniques such as purse lipped breathing and diaphragmatic breathing; and practicing self-pacing with activity.             ITP Comments:Pt is making expected progress toward Pulmonary Rehab goals after completing 0 session(s). Recommend continued exercise, life style modification, education, and utilization of breathing techniques to increase stamina and strength, while also decreasing shortness of breath with exertion.  Dr. Mechele Collin is Medical Director for Pulmonary Rehab at [facility].

## 2023-09-05 NOTE — Progress Notes (Signed)
Daily Session Note  Patient Details  Name: Madison Clements MRN: [id number] Date of Birth: 03-22-1946 Referring Provider:   Doristine Devoid Pulmonary Rehab Walk Test from 08/23/2023 in [facility] for Heart, Vascular, & Lung Health  Referring Provider Turner       Encounter Date: 09/05/2023  Check In:  Session Check In - 09/05/23 1114       Check-In   Supervising physician immediately available to respond to emergencies [facility] MD immediately available    Physician(s) Edd Fabian, NP    [facility]    Staff Present Essie Hart, RN, BSN;Casey Katrinka Blazing, RT;Jhamal Plucinski [facility] BS, ACSM-CEP, Exercise Physiologist;Olinty Peggye Pitt, MS, ACSM-CEP, Exercise Physiologist    Virtual Visit No    Medication changes reported     No    Fall or balance concerns reported    No    Tobacco Cessation No Change    Warm-up and Cool-down Not performed (comment)    Resistance Training Performed Yes    VAD Patient? No    PAD/SET Patient? No      Pain Assessment   Currently in Pain? No/denies    Pain Score 0-No pain    Multiple Pain Sites No             Capillary Blood Glucose: No results found for this or any previous visit (from the past 24 hours).   Exercise Prescription Changes - 09/05/23 1200       Response to Exercise   Blood Pressure (Admit) 112/58    Blood Pressure (Exercise) 180/60    Blood Pressure (Exit) 110/50    Heart Rate (Admit) 67 bpm    Heart Rate (Exercise) 93 bpm    Heart Rate (Exit) 71 bpm    Oxygen Saturation (Admit) 97 %    Oxygen Saturation (Exercise) 93 %    Oxygen Saturation (Exit) 97 %    Rating of Perceived Exertion (Exercise) 13    Perceived Dyspnea (Exercise) 1    Duration Continue with 30 min of aerobic exercise without signs/symptoms of physical distress.    Intensity THRR unchanged      Progression   Progression Continue to progress workloads to maintain intensity without signs/symptoms of physical distress.     Average METs 2.7      Resistance Training   Training Prescription Yes    Weight red bands    Reps 10-15    Time 10 Minutes      NuStep   Level 1    SPM 50    Minutes 15    METs 2.3      Recumbant Elliptical   Level 1    RPM 40    Minutes 15    METs 3.2             Social History   Tobacco Use  Smoking Status Former   Current packs/day: 0.00   Average packs/day: 0.3 packs/day for 10.0 years (2.5 ttl pk-yrs)   Types: Cigarettes   Start date: 09/12/1986   Quit date: 09/12/1996   Years since quitting: 26.9  Smokeless Tobacco Never    Goals Met:  Exercise tolerated well No report of concerns or symptoms today Strength training completed today  Goals Unmet:  Not Applicable  Comments: Service time is from 1015 to 1136.    Dr. Mechele Collin is Medical Director for Pulmonary Rehab at [facility].

## 2023-09-07 ENCOUNTER — Telehealth: Payer: Self-pay | Admitting: Hematology

## 2023-09-07 ENCOUNTER — Encounter (HOSPITAL_COMMUNITY)
Admission: RE | Admit: 2023-09-07 | Discharge: 2023-09-07 | Disposition: A | Discharge status: Home / Self Care | Payer: Medicare Other | Source: Ambulatory Visit | Attending: Cardiology | Admitting: Cardiology

## 2023-09-07 DIAGNOSIS — I272 Pulmonary hypertension, unspecified: Secondary | ICD-10-CM

## 2023-09-07 NOTE — Progress Notes (Signed)
Daily Session Note  Patient Details  Name: Madison Clements MRN: [id number] Date of Birth: [birth year] Referring Provider:   Doristine Devoid Pulmonary Rehab Walk Test from 08/23/2023 in [facility] for Heart, Vascular, & Lung Health  Referring Provider Turner       Encounter Date: 09/07/2023  Check In:  Session Check In - 09/07/23 1029       Check-In   Supervising physician immediately available to respond to emergencies [facility] MD immediately available    Physician(s) Reather Littler, NP    [facility]    Staff Present Essie Hart, RN, BSN;Casey Katrinka Blazing, RT;Randi Lybrook BS, ACSM-CEP, Exercise Physiologist;Maria Whitaker, RN, BSN    Virtual Visit No    Medication changes reported     No    Fall or balance concerns reported    No    Tobacco Cessation No Change    Warm-up and Cool-down Performed as group-led Writer Performed Yes    VAD Patient? No    PAD/SET Patient? No      Pain Assessment   Currently in Pain? No/denies    Multiple Pain Sites No             Capillary Blood Glucose: No results found for this or any previous visit (from the past 24 hours).    Social History   Tobacco Use  Smoking Status Former   Current packs/day: 0.00   Average packs/day: 0.3 packs/day for 10.0 years (2.5 ttl pk-yrs)   Types: Cigarettes   Start date: 09/12/1986   Quit date: 09/12/1996   Years since quitting: 27.0  Smokeless Tobacco Never    Goals Met:  Exercise tolerated well No report of concerns or symptoms today Strength training completed today  Goals Unmet:  Not Applicable  Comments: Service time is from 1017 to 1152    Dr. Mechele Collin is Medical Director for Pulmonary Rehab at [facility].

## 2023-09-07 NOTE — Telephone Encounter (Signed)
Left patient a message in regards to changed appointment times on 09/11/2023

## 2023-09-11 ENCOUNTER — Inpatient Hospital Stay: Payer: Medicare Other

## 2023-09-11 VITALS — BP 136/57 | HR 78 | Temp 98.2°F | Resp 17

## 2023-09-11 DIAGNOSIS — E538 Deficiency of other specified B group vitamins: Secondary | ICD-10-CM | POA: Diagnosis not present

## 2023-09-11 DIAGNOSIS — M8580 Other specified disorders of bone density and structure, unspecified site: Secondary | ICD-10-CM

## 2023-09-11 DIAGNOSIS — Z90710 Acquired absence of both cervix and uterus: Secondary | ICD-10-CM | POA: Diagnosis not present

## 2023-09-11 DIAGNOSIS — Z853 Personal history of malignant neoplasm of breast: Secondary | ICD-10-CM | POA: Diagnosis not present

## 2023-09-11 DIAGNOSIS — Z923 Personal history of irradiation: Secondary | ICD-10-CM | POA: Diagnosis not present

## 2023-09-11 DIAGNOSIS — D649 Anemia, unspecified: Secondary | ICD-10-CM | POA: Diagnosis not present

## 2023-09-11 DIAGNOSIS — M1990 Unspecified osteoarthritis, unspecified site: Secondary | ICD-10-CM | POA: Diagnosis not present

## 2023-09-11 MED ORDER — CYANOCOBALAMIN 1000 MCG/ML IJ SOLN
1000.0000 ug | Freq: Once | INTRAMUSCULAR | Status: AC
  Administered 2023-09-11: 1000 ug via INTRAMUSCULAR
  Filled 2023-09-11: qty 1

## 2023-09-12 ENCOUNTER — Encounter (HOSPITAL_COMMUNITY)
Admission: RE | Admit: 2023-09-12 | Discharge: 2023-09-12 | Disposition: A | Discharge status: Home / Self Care | Payer: Medicare Other | Source: Ambulatory Visit | Attending: Cardiology | Admitting: Cardiology

## 2023-09-12 ENCOUNTER — Ambulatory Visit: Payer: Medicare Other

## 2023-09-12 DIAGNOSIS — I272 Pulmonary hypertension, unspecified: Secondary | ICD-10-CM | POA: Diagnosis not present

## 2023-09-12 NOTE — Progress Notes (Signed)
 Daily Session Note  Patient Details  Name: Madison Clements MRN: [id number] Date of Birth: September 11, 1946 Referring Provider:   Conrad Ports Pulmonary Rehab Walk Test from 08/23/2023 in [facility] for Heart, Vascular, & Lung Health  Referring Provider Turner       Encounter Date: 09/12/2023  Check In:  Session Check In - 09/12/23 1200       Check-In   Supervising physician immediately available to respond to emergencies [facility] MD immediately available    Physician(s) Orren Fabry, PA    [facility]    Staff Present Ronal Levin, RN, BSN;Casey Smith, RT;Randi Coy BS, ACSM-CEP, Exercise Physiologist    Virtual Visit No    Medication changes reported     No    Fall or balance concerns reported    No    Tobacco Cessation No Change    Warm-up and Cool-down Performed as group-led instruction    Resistance Training Performed Yes    VAD Patient? No    PAD/SET Patient? No      Pain Assessment   Currently in Pain? No/denies    Multiple Pain Sites No             Capillary Blood Glucose: No results found for this or any previous visit (from the past 24 hours).    Social History   Tobacco Use  Smoking Status Former   Current packs/day: 0.00   Average packs/day: 0.3 packs/day for 10.0 years (2.5 ttl pk-yrs)   Types: Cigarettes   Start date: 09/12/1986   Quit date: 09/12/1996   Years since quitting: 27.0  Smokeless Tobacco Never    Goals Met:  Independence with exercise equipment Exercise tolerated well No report of concerns or symptoms today Strength training completed today  Goals Unmet:  Not Applicable  Comments: Service time is from 1012 to 1142    Dr. Slater Staff is Medical Director for Pulmonary Rehab at [facility].

## 2023-09-14 ENCOUNTER — Encounter (HOSPITAL_COMMUNITY)
Admission: RE | Admit: 2023-09-14 | Discharge: 2023-09-14 | Disposition: A | Discharge status: Home / Self Care | Payer: Medicare Other | Source: Ambulatory Visit | Attending: Cardiology | Admitting: Cardiology

## 2023-09-14 DIAGNOSIS — I272 Pulmonary hypertension, unspecified: Secondary | ICD-10-CM | POA: Insufficient documentation

## 2023-09-14 NOTE — Progress Notes (Signed)
 Daily Session Note  Patient Details  Name: Madison Clements MRN: [id number] Date of Birth: 02/06/1946 Referring Provider:   Conrad Ports Pulmonary Rehab Walk Test from 08/23/2023 in [facility] for Heart, Vascular, & Lung Health  Referring Provider Turner       Encounter Date: 09/14/2023  Check In:  Session Check In - 09/14/23 1119       Check-In   Supervising physician immediately available to respond to emergencies [facility] immediately available    Physician(s) Rosaline Skains, NP    [facility]    Staff Present Ronal Levin, RN, BSN;Casey Smith, RT;Randi Reeve BS, ACSM-CEP, Exercise Physiologist    Virtual Visit No    Medication changes reported     No    Fall or balance concerns reported    No    Tobacco Cessation No Change    Warm-up and Cool-down Performed as group-led instruction    Resistance Training Performed Yes    VAD Patient? No    PAD/SET Patient? No      Pain Assessment   Currently in Pain? No/denies    Multiple Pain Sites No             Capillary Blood Glucose: No results found for this or any previous visit (from the past 24 hours).    Social History   Tobacco Use  Smoking Status Former   Current packs/day: 0.00   Average packs/day: 0.3 packs/day for 10.0 years (2.5 ttl pk-yrs)   Types: Cigarettes   Start date: 09/12/1986   Quit date: 09/12/1996   Years since quitting: 27.0  Smokeless Tobacco Never    Goals Met:  Independence with exercise equipment Exercise tolerated well No report of concerns or symptoms today Strength training completed today  Goals Unmet:  Not Applicable  Comments: Service time is from 1017 to 1150    Dr. Slater Staff is Medical Director for Pulmonary Rehab at [facility].

## 2023-09-19 ENCOUNTER — Encounter (HOSPITAL_COMMUNITY)
Admission: RE | Admit: 2023-09-19 | Discharge: 2023-09-19 | Disposition: A | Discharge status: Home / Self Care | Payer: Medicare Other | Source: Ambulatory Visit | Attending: Cardiology

## 2023-09-19 VITALS — Wt 149.3 lb

## 2023-09-19 DIAGNOSIS — I272 Pulmonary hypertension, unspecified: Secondary | ICD-10-CM

## 2023-09-19 NOTE — Progress Notes (Signed)
 Daily Session Note  Patient Details  Name: Madison Clements MRN: [id number] Date of Birth: February 11, 1946 Referring Provider:   Conrad Ports Pulmonary Rehab Walk Test from 08/23/2023 in [facility] for Heart, Vascular, & Lung Health  Referring Provider Turner       Encounter Date: 09/19/2023  Check In:  Session Check In - 09/19/23 1019       Check-In   Supervising physician immediately available to respond to emergencies [facility] immediately available    Physician(s) Rosaline Skains, NP    [facility]    Staff Present Ronal Levin, RN, BSN;Saraia Platner Claudene, RT;Randi [facility] BS, ACSM-CEP, Exercise Physiologist;Kaylee Nicholaus, MS, ACSM-CEP, Exercise Physiologist    Virtual Visit No    Medication changes reported     No    Fall or balance concerns reported    No    Tobacco Cessation No Change    Warm-up and Cool-down Performed as group-led instruction    Resistance Training Performed Yes    VAD Patient? No    PAD/SET Patient? No      Pain Assessment   Currently in Pain? No/denies    Multiple Pain Sites No             Capillary Blood Glucose: No results found for this or any previous visit (from the past 24 hours).   Exercise Prescription Changes - 09/19/23 1100       Response to Exercise   Blood Pressure (Admit) 124/60    Blood Pressure (Exercise) 188/60    Blood Pressure (Exit) 136/64    Heart Rate (Admit) 71 bpm    Heart Rate (Exercise) 99 bpm    Heart Rate (Exit) 72 bpm    Oxygen Saturation (Admit) 98 %    Oxygen Saturation (Exercise) 95 %    Oxygen Saturation (Exit) 98 %    Rating of Perceived Exertion (Exercise) 13    Perceived Dyspnea (Exercise) 1    Duration Continue with 30 min of aerobic exercise without signs/symptoms of physical distress.    Intensity THRR unchanged      Progression   Progression Continue to progress workloads to maintain intensity without signs/symptoms of physical distress.       Resistance Training   Training Prescription Yes    Weight red bands    Reps 10-15    Time 10 Minutes      NuStep   Level 2    Minutes 15    METs 2.9      Recumbant Elliptical   Level 2    Minutes 15    METs 4.6             Social History   Tobacco Use  Smoking Status Former   Current packs/day: 0.00   Average packs/day: 0.3 packs/day for 10.0 years (2.5 ttl pk-yrs)   Types: Cigarettes   Start date: 09/12/1986   Quit date: 09/12/1996   Years since quitting: 27.0  Smokeless Tobacco Never    Goals Met:  Proper associated with RPD/PD & O2 Sat Independence with exercise equipment Exercise tolerated well No report of concerns or symptoms today Strength training completed today  Goals Unmet:  Not Applicable  Comments: Service time is from 1005 to 1126.    Dr. Slater Staff is Medical Director for Pulmonary Rehab at [facility].

## 2023-09-21 ENCOUNTER — Encounter (HOSPITAL_COMMUNITY)
Admission: RE | Admit: 2023-09-21 | Discharge: 2023-09-21 | Disposition: A | Discharge status: Home / Self Care | Payer: Medicare Other | Source: Ambulatory Visit | Attending: Cardiology | Admitting: Cardiology

## 2023-09-21 ENCOUNTER — Encounter (HOSPITAL_COMMUNITY): Payer: Medicare Other

## 2023-09-21 DIAGNOSIS — I272 Pulmonary hypertension, unspecified: Secondary | ICD-10-CM

## 2023-09-21 NOTE — Progress Notes (Signed)
 Daily Session Note  Patient Details  Name: Madison Clements MRN: [id number] Date of Birth: 09-15-1945 Referring Provider:   Conrad Ports Pulmonary Rehab Walk Test from 08/23/2023 in [facility] for Heart, Vascular, & Lung Health  Referring Provider Turner       Encounter Date: 09/21/2023  Check In:  Session Check In - 09/21/23 1016       Check-In   Supervising physician immediately available to respond to emergencies [facility] MD immediately available    Physician(s) Barnie Press, NP    [facility]    Staff Present Ronal Levin, RN, BSN;Mirka Barbone Claudene, RT;Randi [facility] BS, ACSM-CEP, Exercise Physiologist;Kaylee Nicholaus, MS, ACSM-CEP, Exercise Physiologist    Virtual Visit No    Medication changes reported     No    Fall or balance concerns reported    No    Tobacco Cessation No Change    Warm-up and Cool-down Performed as group-led instruction    Resistance Training Performed Yes    VAD Patient? No    PAD/SET Patient? No      Pain Assessment   Currently in Pain? No/denies    Multiple Pain Sites No             Capillary Blood Glucose: No results found for this or any previous visit (from the past 24 hours).    Social History   Tobacco Use  Smoking Status Former   Current packs/day: 0.00   Average packs/day: 0.3 packs/day for 10.0 years (2.5 ttl pk-yrs)   Types: Cigarettes   Start date: 09/12/1986   Quit date: 09/12/1996   Years since quitting: 27.0  Smokeless Tobacco Never    Goals Met:  Proper associated with RPD/PD & O2 Sat Independence with exercise equipment Exercise tolerated well No report of concerns or symptoms today Strength training completed today  Goals Unmet:  Not Applicable  Comments: Service time is from 1010 to 1145.    Dr. Slater Staff is Medical Director for Pulmonary Rehab at [facility].

## 2023-09-26 ENCOUNTER — Encounter (HOSPITAL_COMMUNITY): Payer: Medicare Other

## 2023-09-26 ENCOUNTER — Encounter (HOSPITAL_COMMUNITY)
Admission: RE | Admit: 2023-09-26 | Discharge: 2023-09-26 | Disposition: A | Discharge status: Home / Self Care | Payer: Medicare Other | Source: Ambulatory Visit | Attending: Cardiology

## 2023-09-26 DIAGNOSIS — I272 Pulmonary hypertension, unspecified: Secondary | ICD-10-CM | POA: Diagnosis not present

## 2023-09-26 NOTE — Progress Notes (Signed)
 Daily Session Note  Patient Details  Name: Madison Clements MRN: [id number] Date of Birth: [birth year] Referring Provider:   Conrad Ports Pulmonary Rehab Walk Test from 08/23/2023 in [facility] for Heart, Vascular, & Lung Health  Referring Provider Turner       Encounter Date: 09/26/2023  Check In:  Session Check In - 09/26/23 1023       Check-In   Supervising physician immediately available to respond to emergencies [facility] MD immediately available    Physician(s) Lamarr Satterfield, NP    [facility]    Staff Present Ronal Levin, RN, BSN;Shritha Bresee Claudene, RT;Randi [facility] BS, ACSM-CEP, Exercise Physiologist;Kaylee Nicholaus, MS, ACSM-CEP, Exercise Physiologist    Virtual Visit No    Medication changes reported     No    Fall or balance concerns reported    No    Tobacco Cessation No Change    Warm-up and Cool-down Performed as group-led instruction    Resistance Training Performed Yes    VAD Patient? No    PAD/SET Patient? No      Pain Assessment   Currently in Pain? No/denies    Multiple Pain Sites No             Capillary Blood Glucose: No results found for this or any previous visit (from the past 24 hours).    Social History   Tobacco Use  Smoking Status Former   Current packs/day: 0.00   Average packs/day: 0.3 packs/day for 10.0 years (2.5 ttl pk-yrs)   Types: Cigarettes   Start date: 09/12/1986   Quit date: 09/12/1996   Years since quitting: 27.0  Smokeless Tobacco Never    Goals Met:  Proper associated with RPD/PD & O2 Sat Independence with exercise equipment Exercise tolerated well No report of concerns or symptoms today Strength training completed today  Goals Unmet:  Not Applicable  Comments: Service time is from 1012 to 1130.    Dr. Slater Staff is Medical Director for Pulmonary Rehab at [facility].

## 2023-09-28 ENCOUNTER — Encounter (HOSPITAL_COMMUNITY)
Admission: RE | Admit: 2023-09-28 | Discharge: 2023-09-28 | Disposition: A | Discharge status: Home / Self Care | Payer: Medicare Other | Source: Ambulatory Visit | Attending: Cardiology | Admitting: Cardiology

## 2023-09-28 ENCOUNTER — Encounter (HOSPITAL_COMMUNITY): Payer: Medicare Other

## 2023-09-28 DIAGNOSIS — I272 Pulmonary hypertension, unspecified: Secondary | ICD-10-CM

## 2023-09-28 NOTE — Progress Notes (Signed)
Daily Session Note  Patient Details  Name: Madison Clements MRN: [id number] Date of Birth: [birth year] Referring Provider:   Doristine Devoid Pulmonary Rehab Walk Test from 08/23/2023 in [facility] for Heart, Vascular, & Lung Health  Referring Provider Turner       Encounter Date: 09/28/2023  Check In:  Session Check In - 09/28/23 1019       Check-In   Supervising physician immediately available to respond to emergencies [facility] MD immediately available    Physician(s) Joni Reining, NP    [facility]    Staff Present Essie Hart, RN, BSN;Casey Katrinka Blazing, RT;Randi [facility] BS, ACSM-CEP, Exercise Physiologist;Lynn Sissel Earlene Plater, MS, ACSM-CEP, Exercise Physiologist    Virtual Visit No    Medication changes reported     No    Fall or balance concerns reported    No    Tobacco Cessation No Change    Warm-up and Cool-down Performed as group-led instruction    Resistance Training Performed Yes    VAD Patient? No    PAD/SET Patient? No      Pain Assessment   Currently in Pain? No/denies    Pain Score 0-No pain    Multiple Pain Sites No             Capillary Blood Glucose: No results found for this or any previous visit (from the past 24 hours).    Social History   Tobacco Use  Smoking Status Former   Current packs/day: 0.00   Average packs/day: 0.3 packs/day for 10.0 years (2.5 ttl pk-yrs)   Types: Cigarettes   Start date: 09/12/1986   Quit date: 09/12/1996   Years since quitting: 27.0  Smokeless Tobacco Never    Goals Met:  Proper associated with RPD/PD & O2 Sat Exercise tolerated well No report of concerns or symptoms today Strength training completed today  Goals Unmet:  Not Applicable  Comments: Service time is from 1011 to 1130.    Dr. Mechele Collin is Medical Director for Pulmonary Rehab at [facility].

## 2023-10-03 ENCOUNTER — Encounter (HOSPITAL_COMMUNITY)
Admission: RE | Admit: 2023-10-03 | Discharge: 2023-10-03 | Disposition: A | Discharge status: Home / Self Care | Payer: Medicare Other | Source: Ambulatory Visit | Attending: Cardiology | Admitting: Cardiology

## 2023-10-03 ENCOUNTER — Encounter (HOSPITAL_COMMUNITY): Payer: Medicare Other

## 2023-10-03 VITALS — Wt 149.9 lb

## 2023-10-03 DIAGNOSIS — I272 Pulmonary hypertension, unspecified: Secondary | ICD-10-CM | POA: Diagnosis not present

## 2023-10-03 NOTE — Progress Notes (Signed)
Daily Session Note  Patient Details  Name: Madison Clements MRN: [id number] Date of Birth: [birth year] Referring Provider:   Doristine Devoid Pulmonary Rehab Walk Test from 08/23/2023 in [facility] for Heart, Vascular, & Lung Health  Referring Provider Turner       Encounter Date: 10/03/2023  Check In:  Session Check In - 10/03/23 1126       Check-In   Supervising physician immediately available to respond to emergencies [facility] MD immediately available    Physician(s) Bernadene Person, NP    Location [facility]    Staff Present Essie Hart, RN, BSN;Ellis Koffler Katrinka Blazing, Zella Richer, MS, ACSM-CEP, Exercise Physiologist;Johnny Hale Bogus, MS, Exercise Physiologist    Virtual Visit No    Medication changes reported     No    Fall or balance concerns reported    No    Tobacco Cessation No Change    Warm-up and Cool-down Performed as group-led instruction    Resistance Training Performed Yes    VAD Patient? No    PAD/SET Patient? No      Pain Assessment   Currently in Pain? No/denies    Multiple Pain Sites No             Capillary Blood Glucose: No results found for this or any previous visit (from the past 24 hours).   Exercise Prescription Changes - 10/03/23 1200       Response to Exercise   Blood Pressure (Admit) 126/60    Blood Pressure (Exercise) 144/64    Blood Pressure (Exit) 128/52    Heart Rate (Admit) 66 bpm    Heart Rate (Exercise) 91 bpm    Heart Rate (Exit) 70 bpm    Oxygen Saturation (Admit) 97 %    Oxygen Saturation (Exercise) 96 %    Oxygen Saturation (Exit) 96 %    Rating of Perceived Exertion (Exercise) 12    Perceived Dyspnea (Exercise) 1    Duration Continue with 30 min of aerobic exercise without signs/symptoms of physical distress.    Intensity THRR unchanged      Progression   Progression Continue to progress workloads to maintain intensity without signs/symptoms of physical distress.      Resistance Training    Training Prescription Yes    Weight red bands    Reps 10-15    Time 10 Minutes      NuStep   Level 2    SPM 115    Minutes 15    METs 3.4      Recumbant Elliptical   Level 2    Minutes 15    METs 5.4             Social History   Tobacco Use  Smoking Status Former   Current packs/day: 0.00   Average packs/day: 0.3 packs/day for 10.0 years (2.5 ttl pk-yrs)   Types: Cigarettes   Start date: 09/12/1986   Quit date: 09/12/1996   Years since quitting: 27.0  Smokeless Tobacco Never    Goals Met:  Proper associated with RPD/PD & O2 Sat Independence with exercise equipment Exercise tolerated well No report of concerns or symptoms today Strength training completed today  Goals Unmet:  Not Applicable  Comments: Service time is from 1019 to 1142.    Dr. Mechele Collin is Medical Director for Pulmonary Rehab at [facility].

## 2023-10-04 NOTE — Progress Notes (Signed)
Pulmonary Individual Treatment Plan  Patient Details  Name: Madison Clements MRN: [id number] Date of Birth: 1946-02-19 Referring Provider:   Doristine Devoid Pulmonary Rehab Walk Test from 08/23/2023 in [facility]  Referring Provider Turner       Initial Encounter Date:  Flowsheet Row Pulmonary Rehab Walk Test from 08/23/2023 in [facility] for Heart, Vascular, & Lung Health  Date 08/23/23       Visit Diagnosis: Pulmonary hypertension (HCC)  Patient's Home Medications on Admission:   Current Outpatient Medications:    aspirin EC 81 MG tablet, Take 1 tablet (81 mg total) by mouth daily. Swallow whole., Disp: 30 tablet, Rfl: 12   diltiazem (CARDIZEM CD) 300 MG 24 hr capsule, Take 1 capsule (300 mg total) by mouth daily., Disp: 90 capsule, Rfl: 3   furosemide (LASIX) 40 MG tablet, Take 40 mg by mouth daily., Disp: , Rfl:    lisinopril (ZESTRIL) 10 MG tablet, Take 10 mg by mouth daily., Disp: , Rfl:    Prenatal Vit-Fe Fumarate-FA (PRENATAL VITAMIN PO), Take by mouth daily. Prenatal w/folic acid po daily, Disp: , Rfl:    Probiotic Product (ADVANCED PROBIOTIC PO), Take 1 capsule by mouth daily., Disp: , Rfl:    rosuvastatin (CRESTOR) 40 MG tablet, TAKE 1 TABLET (40 MG TOTAL) BY MOUTH AT BEDTIME., Disp: 90 tablet, Rfl: 3  Past Medical History: Past Medical History:  Diagnosis Date   Allergy 1990   Morphine following surgery   Arthritis    generalized   Atypical mole 02/24/2015   RGHT LATERAL THIGH MODERATE   Atypical mole 01/20/2022   Left Breast (mild)   Atypical mole 01/20/2022   Mid Back (mild)   Atypical nevi 02/24/2015   RIGHT NECK MILD   Atypical nevi 08/25/2015   LEFT UPPER ARM MILD   Atypical nevi 08/25/2015   LEFT LATERAL FOREARM MILD/FREE   Atypical nevi 02/23/2016   LEFT THIGH MODERATE/FREE   Atypical nevi 02/23/2016   LEFT MEDIAL LEG MODERATE/FREE   Atypical nevi 02/23/2016    LEFT UPPER BACK MILD /FREE   Atypical nevi 08/23/2016   RIGHT ANT PROX THIGH MILD /FREE   Atypical nevi 07/25/2018   MID LOWER BACK MODERATE   Atypical nevi 01/22/2019   LEFT MID BACK MILD /FREE   Atypical nevi 01/22/2019   LEFT NECK MILD   Atypical nevi 08/06/2019   LEFT INNER BREAST MODERATE W/S   Atypical nevi 08/06/2019   LEFT OUTER SIDE MILD/FREE   Blood transfusion without reported diagnosis    hx of   Breast cancer (HCC) 2022   CAD (coronary artery disease), native coronary artery 02/07/2019   Minimal CAD at the time of cath in 2016 in [facility]   Family history of breast cancer 08/19/2021   Family history of pancreatic cancer 08/19/2021   Fatty liver 05/21/2020   Gastric polyps 10/02/2018   EGD 03/2015; benign   GERD (gastroesophageal reflux disease) 10/02/2018   on meds   History of melanoma 10/02/2018   Abdomen 2012; 2013; s/p local excisions.    Hyperlipidemia    on meds   Hypertension    on meds   Melanoma (HCC) 2012   MM- central lower abdomen- (ECX) [facility]   Osteopenia    Personal history of radiation therapy    Polyp of colon, adenomatous 10/02/2018   Colonoscopy 03/2015; recheck in 2021   PONV (postoperative nausea and vomiting)  Pulmonary hypertension, primary (HCC) 10/02/2018   SCCA (squamous cell carcinoma) of skin 09/18/2017   RIGHT ANT. DISTAL LOWER LEG TREATED BY DR. Leda Quail   Sleep apnea    Sleep apnea 05/26/2020   uses CPAP   Tricuspid regurgitation    moderate by echo 04/2023    Tobacco Use: Social History   Tobacco Use  Smoking Status Former   Current packs/day: 0.00   Average packs/day: 0.3 packs/day for 10.0 years (2.5 ttl pk-yrs)   Types: Cigarettes   Start date: 09/12/1986   Quit date: 09/12/1996   Years since quitting: 27.0  Smokeless Tobacco Never    Labs: Review Flowsheet  More data exists      Latest Ref Rng & Units 03/09/2021 07/09/2021 05/18/2022 03/08/2023 06/20/2023  Labs for ITP Cardiac  and Pulmonary Rehab  Cholestrol 100 - 199 mg/dL 440  102  725  366  -  LDL (calc) 0 - 99 mg/dL 55  66  63  61  -  HDL-C >39 mg/dL 66  44.03  48  55  -  Trlycerides 0 - 149 mg/dL 474  259.5  638  756  -  Bicarbonate 20.0 - 28.0 mmol/L - - - - 21.6  21.9   TCO2 22 - 32 mmol/L - - - - 23  23   Acid-base deficit 0.0 - 2.0 mmol/L - - - - 3.0  3.0   O2 Saturation % - - - - 62  62     Details       Multiple values from one day are sorted in reverse-chronological order         Capillary Blood Glucose: No results found for: "GLUCAP"   Pulmonary Assessment Scores:  Pulmonary Assessment Scores     Row Name 08/23/23 1110         ADL UCSD   ADL Phase Entry     SOB Score total 53       CAT Score   CAT Score 17       mMRC Score   mMRC Score 4             UCSD: Self-administered rating of dyspnea associated with activities of daily living (ADLs) 6-point scale (0 = "not at all" to 5 = "maximal or unable to do because of breathlessness")  Scoring Scores range from 0 to 120.  Minimally important difference is 5 units  CAT: CAT can identify the health impairment of COPD patients and is better correlated with disease progression.  CAT has a scoring range of zero to 40. The CAT score is classified into four groups of low (less than 10), medium (10 - 20), high (21-30) and very high (31-40) based on the impact level of disease on health status. A CAT score over 10 suggests significant symptoms.  A worsening CAT score could be explained by an exacerbation, poor medication adherence, poor inhaler technique, or progression of COPD or comorbid conditions.  CAT MCID is 2 points  mMRC: mMRC (Modified Medical Research Council) Dyspnea Scale is used to assess the degree of baseline functional disability in patients of respiratory disease due to dyspnea. No minimal important difference is established. A decrease in score of 1 point or greater is considered a positive change.   Pulmonary  Function Assessment:  Pulmonary Function Assessment - 08/23/23 1040       Breath   Bilateral Breath Sounds Clear    Shortness of Breath Yes;Limiting activity  Exercise Target Goals: Exercise Program Goal: Individual exercise prescription set using results from initial 6 min walk test and THRR while considering  patient's activity barriers and safety.   Exercise Prescription Goal: Initial exercise prescription builds to 30-45 minutes a day of aerobic activity, 2-3 days per week.  Home exercise guidelines will be given to patient during program as part of exercise prescription that the participant will acknowledge.  Activity Barriers & Risk Stratification:  Activity Barriers & Cardiac Risk Stratification - 08/23/23 1038       Activity Barriers & Cardiac Risk Stratification   Activity Barriers Arthritis;Deconditioning;Muscular Weakness;Shortness of Breath   needs right knee replacement   Cardiac Risk Stratification Moderate             6 Minute Walk:  6 Minute Walk     Row Name 08/23/23 1114         6 Minute Walk   Phase Initial     Distance 1129 feet     Walk Time 6 minutes     # of Rest Breaks 0     MPH 2.14     METS 2.12     RPE 13     Perceived Dyspnea  1     VO2 Peak 7.42     Symptoms No     Resting HR 67 bpm     Resting BP 118/58     Resting Oxygen Saturation  97 %     Exercise Oxygen Saturation  during 6 min walk 95 %     Max Ex. HR 94 bpm     Max Ex. BP 158/56     2 Minute Post BP 124/58       Interval HR   1 Minute HR 83     2 Minute HR 91     3 Minute HR 94     4 Minute HR 93     5 Minute HR 94     6 Minute HR 91     2 Minute Post HR 75     Interval Heart Rate? Yes       Interval Oxygen   Interval Oxygen? Yes     Baseline Oxygen Saturation % 97 %     1 Minute Oxygen Saturation % 96 %     1 Minute Liters of Oxygen 0 L     2 Minute Oxygen Saturation % 96 %     2 Minute Liters of Oxygen 0 L     3 Minute Oxygen Saturation %  96 %     3 Minute Liters of Oxygen 0 L     4 Minute Oxygen Saturation % 95 %     4 Minute Liters of Oxygen 0 L     5 Minute Oxygen Saturation % 95 %     5 Minute Liters of Oxygen 0 L     6 Minute Oxygen Saturation % 95 %     6 Minute Liters of Oxygen 0 L     2 Minute Post Oxygen Saturation % 98 %     2 Minute Post Liters of Oxygen 0 L              Oxygen Initial Assessment:  Oxygen Initial Assessment - 08/23/23 1039       Home Oxygen   Home Oxygen Device None    Sleep Oxygen Prescription CPAP    Home Exercise Oxygen Prescription None    Home Resting Oxygen Prescription None  Initial 6 min Walk   Oxygen Used None      Program Oxygen Prescription   Program Oxygen Prescription None      Intervention   Short Term Goals To learn and understand importance of monitoring SPO2 with pulse oximeter and demonstrate accurate use of the pulse oximeter.;To learn and understand importance of maintaining oxygen saturations>88%;To learn and demonstrate proper pursed lip breathing techniques or other breathing techniques.     Long  Term Goals Maintenance of O2 saturations>88%;Verbalizes importance of monitoring SPO2 with pulse oximeter and return demonstration;Exhibits proper breathing techniques, such as pursed lip breathing or other method taught during program session             Oxygen Re-Evaluation:  Oxygen Re-Evaluation     Row Name 08/23/23 1039 09/29/23 1106           Program Oxygen Prescription   Program Oxygen Prescription -- None        Home Oxygen   Home Oxygen Device -- None      Sleep Oxygen Prescription -- CPAP      Home Exercise Oxygen Prescription -- None      Home Resting Oxygen Prescription -- None        Goals/Expected Outcomes   Short Term Goals -- To learn and understand importance of monitoring SPO2 with pulse oximeter and demonstrate accurate use of the pulse oximeter.;To learn and understand importance of maintaining oxygen saturations>88%;To  learn and demonstrate proper pursed lip breathing techniques or other breathing techniques.       Long  Term Goals -- Maintenance of O2 saturations>88%;Verbalizes importance of monitoring SPO2 with pulse oximeter and return demonstration;Exhibits proper breathing techniques, such as pursed lip breathing or other method taught during program session      Goals/Expected Outcomes Compliance and understanding of oxygen saturation monitoring and breathing techniques to decrease shortness of breath. Compliance and understanding of oxygen saturation monitoring and breathing techniques to decrease shortness of breath.               Oxygen Discharge (Final Oxygen Re-Evaluation):  Oxygen Re-Evaluation - 09/29/23 1106       Program Oxygen Prescription   Program Oxygen Prescription None      Home Oxygen   Home Oxygen Device None    Sleep Oxygen Prescription CPAP    Home Exercise Oxygen Prescription None    Home Resting Oxygen Prescription None      Goals/Expected Outcomes   Short Term Goals To learn and understand importance of monitoring SPO2 with pulse oximeter and demonstrate accurate use of the pulse oximeter.;To learn and understand importance of maintaining oxygen saturations>88%;To learn and demonstrate proper pursed lip breathing techniques or other breathing techniques.     Long  Term Goals Maintenance of O2 saturations>88%;Verbalizes importance of monitoring SPO2 with pulse oximeter and return demonstration;Exhibits proper breathing techniques, such as pursed lip breathing or other method taught during program session    Goals/Expected Outcomes Compliance and understanding of oxygen saturation monitoring and breathing techniques to decrease shortness of breath.             Initial Exercise Prescription:  Initial Exercise Prescription - 08/23/23 1100       Date of Initial Exercise RX and Referring Provider   Date 08/23/23    Referring Provider Turner    Expected Discharge Date  11/16/23      NuStep   Level 1    SPM 60    Minutes 15    METs 2  Recumbant Elliptical   Level 1    RPM 40    Watts 20    Minutes 15    METs 2      Prescription Details   Frequency (times per week) 2    Duration Progress to 30 minutes of continuous aerobic without signs/symptoms of physical distress      Intensity   THRR 40-80% of Max Heartrate 56-113    Ratings of Perceived Exertion 11-13    Perceived Dyspnea 0-4      Progression   Progression Continue to progress workloads to maintain intensity without signs/symptoms of physical distress.      Resistance Training   Training Prescription Yes    Weight blue bands    Reps 10-15             Perform Capillary Blood Glucose checks as needed.  Exercise Prescription Changes:   Exercise Prescription Changes     Row Name 09/05/23 1200 09/19/23 1100 10/03/23 1200         Response to Exercise   Blood Pressure (Admit) 112/58 124/60 126/60     Blood Pressure (Exercise) 180/60 188/60 144/64     Blood Pressure (Exit) 110/50 136/64 128/52     Heart Rate (Admit) 67 bpm 71 bpm 66 bpm     Heart Rate (Exercise) 93 bpm 99 bpm 91 bpm     Heart Rate (Exit) 71 bpm 72 bpm 70 bpm     Oxygen Saturation (Admit) 97 % 98 % 97 %     Oxygen Saturation (Exercise) 93 % 95 % 96 %     Oxygen Saturation (Exit) 97 % 98 % 96 %     Rating of Perceived Exertion (Exercise) 13 13 12      Perceived Dyspnea (Exercise) 1 1 1      Duration Continue with 30 min of aerobic exercise without signs/symptoms of physical distress. Continue with 30 min of aerobic exercise without signs/symptoms of physical distress. Continue with 30 min of aerobic exercise without signs/symptoms of physical distress.     Intensity THRR unchanged THRR unchanged THRR unchanged       Progression   Progression Continue to progress workloads to maintain intensity without signs/symptoms of physical distress. Continue to progress workloads to maintain intensity without  signs/symptoms of physical distress. Continue to progress workloads to maintain intensity without signs/symptoms of physical distress.     Average METs 2.7 -- --       Resistance Training   Training Prescription Yes Yes Yes     Weight red bands red bands red bands     Reps 10-15 10-15 10-15     Time 10 Minutes 10 Minutes 10 Minutes       NuStep   Level 1 2 2      SPM 50 -- 115     Minutes 15 15 15      METs 2.3 2.9 3.4       Recumbant Elliptical   Level 1 2 2      RPM 40 -- --     Minutes 15 15 15      METs 3.2 4.6 5.4              Exercise Comments:   Exercise Comments     Row Name 09/05/23 1208           Exercise Comments Pt completed first day of group exercise. She exercised on the recumbent elliptical for 15 min, level 1, METs 3.2. she then exercised on the recumbent  stepper for 15 min, level 1, METs 2.3. Pt tolerated well and was able to perform warm up and cooldown exercises standing, including squats. Discussed METs with good reception.                Exercise Goals and Review:   Exercise Goals     Row Name 08/23/23 1039             Exercise Goals   Increase Physical Activity Yes       Intervention Provide advice, education, support and counseling about physical activity/exercise needs.;Develop an individualized exercise prescription for aerobic and resistive training based on initial evaluation findings, risk stratification, comorbidities and participant's personal goals.       Expected Outcomes Short Term: Attend rehab on a regular basis to increase amount of physical activity.;Long Term: Add in home exercise to make exercise part of routine and to increase amount of physical activity.;Long Term: Exercising regularly at least 3-5 days a week.       Increase Strength and Stamina Yes       Intervention Provide advice, education, support and counseling about physical activity/exercise needs.;Develop an individualized exercise prescription for aerobic and  resistive training based on initial evaluation findings, risk stratification, comorbidities and participant's personal goals.       Expected Outcomes Short Term: Increase workloads from initial exercise prescription for resistance, speed, and METs.;Short Term: Perform resistance training exercises routinely during rehab and add in resistance training at home;Long Term: Improve cardiorespiratory fitness, muscular endurance and strength as measured by increased METs and functional capacity ( )       Able to understand and use rate of perceived exertion (RPE) scale Yes       Intervention Provide education and explanation on how to use RPE scale       Expected Outcomes Short Term: Able to use RPE daily in rehab to express subjective intensity level;Long Term:  Able to use RPE to guide intensity level when exercising independently       Able to understand and use Dyspnea scale Yes       Intervention Provide education and explanation on how to use Dyspnea scale       Expected Outcomes Short Term: Able to use Dyspnea scale daily in rehab to express subjective sense of shortness of breath during exertion;Long Term: Able to use Dyspnea scale to guide intensity level when exercising independently       Knowledge and understanding of Target Heart Rate Range (THRR) Yes       Intervention Provide education and explanation of THRR including how the numbers were predicted and where they are located for reference       Expected Outcomes Short Term: Able to state/look up THRR;Long Term: Able to use THRR to govern intensity when exercising independently;Short Term: Able to use daily as guideline for intensity in rehab       Understanding of Exercise Prescription Yes       Intervention Provide education, explanation, and written materials on patient's individual exercise prescription       Expected Outcomes Short Term: Able to explain program exercise prescription;Long Term: Able to explain home exercise prescription  to exercise independently                Exercise Goals Re-Evaluation :  Exercise Goals Re-Evaluation     Row Name 08/28/23 0735 09/29/23 1103           Exercise Goal Re-Evaluation   Exercise Goals Review Increase Physical  Activity;Able to understand and use Dyspnea scale;Understanding of Exercise Prescription;Increase Strength and Stamina;Knowledge and understanding of Target Heart Rate Range (THRR);Able to understand and use rate of perceived exertion (RPE) scale Increase Physical Activity;Able to understand and use Dyspnea scale;Understanding of Exercise Prescription;Increase Strength and Stamina;Knowledge and understanding of Target Heart Rate Range (THRR);Able to understand and use rate of perceived exertion (RPE) scale      Comments [name] is scheduled to start exercise on 12/24. Will continue to monitor and progress as able. [name] has completed 8 exercise sessions. She exercises for 15 min on the recumbent elliptical and Nustep. She averages 4.9 METs at level 2 on the recumbent elliptical and 3.2 METs at level 2 on the Nustep. [name] performs the warmup and cooldown standing without limitations. She has increased her workload for both exercise modes as METs have significantly increased on the recumbent elliptical and Nustep. [name] toterates progressions well and seems to like rehab. Will continue to monitor and progress as able.      Expected Outcomes Through exercise at rehab and home, the patient will decrease shortness of breath with daily activities and feel confident in carrying out an exercise regimen at home. Through exercise at rehab and home, the patient will decrease shortness of breath with daily activities and feel confident in carrying out an exercise regimen at home.               Discharge Exercise Prescription (Final Exercise Prescription Changes):  Exercise Prescription Changes - 10/03/23 1200       Response to Exercise   Blood Pressure (Admit) 126/60     Blood Pressure (Exercise) 144/64    Blood Pressure (Exit) 128/52    Heart Rate (Admit) 66 bpm    Heart Rate (Exercise) 91 bpm    Heart Rate (Exit) 70 bpm    Oxygen Saturation (Admit) 97 %    Oxygen Saturation (Exercise) 96 %    Oxygen Saturation (Exit) 96 %    Rating of Perceived Exertion (Exercise) 12    Perceived Dyspnea (Exercise) 1    Duration Continue with 30 min of aerobic exercise without signs/symptoms of physical distress.    Intensity THRR unchanged      Progression   Progression Continue to progress workloads to maintain intensity without signs/symptoms of physical distress.      Resistance Training   Training Prescription Yes    Weight red bands    Reps 10-15    Time 10 Minutes      NuStep   Level 2    SPM 115    Minutes 15    METs 3.4      Recumbant Elliptical   Level 2    Minutes 15    METs 5.4             Nutrition:  Target Goals: Understanding of nutrition guidelines, daily intake of sodium 1500mg , cholesterol 200mg , calories 30% from fat and 7% or less from saturated fats, daily to have 5 or more servings of fruits and vegetables.  Biometrics:  Pre Biometrics - 08/23/23 1024       Pre Biometrics   Grip Strength 30 kg              Nutrition Therapy Plan and Nutrition Goals:   Nutrition Assessments:  MEDIFICTS Score Key: >=70 Need to make dietary changes  40-70 Heart Healthy Diet <= 40 Therapeutic Level Cholesterol Diet   Picture Your Plate Scores: <29 Unhealthy dietary pattern with much room for improvement.  41-50 Dietary pattern unlikely to meet recommendations for good health and room for improvement. 51-60 More healthful dietary pattern, with some room for improvement.  >60 Healthy dietary pattern, although there may be some specific behaviors that could be improved.    Nutrition Goals Re-Evaluation:   Nutrition Goals Discharge (Final Nutrition Goals Re-Evaluation):   Psychosocial: Target Goals: Acknowledge presence  or absence of significant depression and/or stress, maximize coping skills, provide positive support system. Participant is able to verbalize types and ability to use techniques and skills needed for reducing stress and depression.  Initial Review & Psychosocial Screening:  Initial Psych Review & Screening - 08/23/23 1036       Initial Review   Current issues with None Identified      Family Dynamics   Good Support System? Yes    Comments Has friends and family as her support      Barriers   Psychosocial barriers to participate in program There are no identifiable barriers or psychosocial needs.      Screening Interventions   Interventions Encouraged to exercise             Quality of Life Scores:  Scores of 19 and below usually indicate a poorer quality of life in these areas.  A difference of  2-3 points is a clinically meaningful difference.  A difference of 2-3 points in the total score of the Quality of Life Index has been associated with significant improvement in overall quality of life, self-image, physical symptoms, and general health in studies assessing change in quality of life.  PHQ-9: Review Flowsheet  More data exists      08/23/2023 07/14/2023 05/24/2023 04/28/2023 04/11/2023  Depression screen PHQ 2/9  Decreased Interest 0 0 0 0 0  Down, Depressed, Hopeless 0 0 0 0 0  PHQ - 2 Score 0 0 0 0 0  Altered sleeping 0 - - - -  Tired, decreased energy 1 - - - -  Change in appetite 0 - - - -  Feeling bad or failure about yourself  0 - - - -  Trouble concentrating 0 - - - -  Moving slowly or fidgety/restless 0 - - - -  Suicidal thoughts 0 - - - -  PHQ-9 Score 1 - - - -  Difficult doing work/chores Not difficult at all - - - -   Interpretation of Total Score  Total Score Depression Severity:  1-4 = Minimal depression, 5-9 = Mild depression, 10-14 = Moderate depression, 15-19 = Moderately severe depression, 20-27 = Severe depression   Psychosocial Evaluation and  Intervention:  Psychosocial Evaluation - 08/23/23 1037       Psychosocial Evaluation & Interventions   Interventions Encouraged to exercise with the program and follow exercise prescription    Comments [name] denies any psychosocial barriers or concerns at this time.    Expected Outcomes For [name] to participate in PR free of any psychosocial barriers or concerns    Continue Psychosocial Services  No Follow up required             Psychosocial Re-Evaluation:  Psychosocial Re-Evaluation     Row Name 09/01/23 0955 09/29/23 1044           Psychosocial Re-Evaluation   Current issues with None Identified None Identified      Comments Pt is scheduled to start the PR program on 09/05/23. No new barriers or concerns since orientation on 12/11. [name] continues to deny any psychosocial barriers or concerns at  this time.      Expected Outcomes For [name] to participate in PR free of any psychosocial barriers or concerns. For [name] to participate in PR free of any psychosocial barriers or concerns.      Interventions Encouraged to attend Pulmonary Rehabilitation for the exercise Encouraged to attend Pulmonary Rehabilitation for the exercise      Continue Psychosocial Services  No Follow up required No Follow up required               Psychosocial Discharge (Final Psychosocial Re-Evaluation):  Psychosocial Re-Evaluation - 09/29/23 1044       Psychosocial Re-Evaluation   Current issues with None Identified    Comments [name] continues to deny any psychosocial barriers or concerns at this time.    Expected Outcomes For [name] to participate in PR free of any psychosocial barriers or concerns.    Interventions Encouraged to attend Pulmonary Rehabilitation for the exercise    Continue Psychosocial Services  No Follow up required             Education: Education Goals: Education classes will be provided on a weekly basis, covering required topics. Participant will state  understanding/return demonstration of topics presented.  Learning Barriers/Preferences:  Learning Barriers/Preferences - 08/23/23 1037       Learning Barriers/Preferences   Learning Barriers None    Learning Preferences Written Material;Skilled Demonstration             Education Topics: Know Your Numbers Group instruction that is supported by a PowerPoint presentation. Instructor discusses importance of knowing and understanding resting, exercise, and post-exercise oxygen saturation, heart rate, and blood pressure. Oxygen saturation, heart rate, blood pressure, rating of perceived exertion, and dyspnea are reviewed along with a normal range for these values.  Flowsheet Row PULMONARY REHAB CHRONIC OBSTRUCTIVE PULMONARY DISEASE from 09/14/2023 in [facility] for Heart, Vascular, & Lung Health  Date 09/14/23  Educator EP  Instruction Review Code 1- Verbalizes Understanding       Exercise for the Pulmonary Patient Group instruction that is supported by a PowerPoint presentation. Instructor discusses benefits of exercise, core components of exercise, frequency, duration, and intensity of an exercise routine, importance of utilizing pulse oximetry during exercise, safety while exercising, and options of places to exercise outside of rehab.  Flowsheet Row PULMONARY REHAB CHRONIC OBSTRUCTIVE PULMONARY DISEASE from 09/07/2023 in [facility] for Heart, Vascular, & Lung Health  Date 09/07/23  Educator EP  Instruction Review Code 1- Verbalizes Understanding       MET Level  Group instruction provided by PowerPoint, verbal discussion, and written material to support subject matter. Instructor reviews what METs are and how to increase METs.    Pulmonary Medications Verbally interactive group education provided by instructor with focus on inhaled medications and proper administration.   Anatomy and Physiology of the Respiratory  System Group instruction provided by PowerPoint, verbal discussion, and written material to support subject matter. Instructor reviews respiratory cycle and anatomical components of the respiratory system and their functions. Instructor also reviews differences in obstructive and restrictive respiratory diseases with examples of each.    Oxygen Safety Group instruction provided by PowerPoint, verbal discussion, and written material to support subject matter. There is an overview of "What is Oxygen" and "Why do we need it".  Instructor also reviews how to create a safe environment for oxygen use, the importance of using oxygen as prescribed, and the risks of noncompliance. There is a brief discussion on  traveling with oxygen and resources the patient may utilize. Flowsheet Row PULMONARY REHAB OTHER RESPIRATORY from 09/21/2023 in [facility] for Heart, Vascular, & Lung Health  Date 09/21/23  Educator RN  Instruction Review Code 1- Verbalizes Understanding       Oxygen Use Group instruction provided by PowerPoint, verbal discussion, and written material to discuss how supplemental oxygen is prescribed and different types of oxygen supply systems. Resources for more information are provided.  Flowsheet Row PULMONARY REHAB OTHER RESPIRATORY from 09/28/2023 in [facility] for Heart, Vascular, & Lung Health  Date 09/28/23  Educator RT  Instruction Review Code 1- Verbalizes Understanding       Breathing Techniques Group instruction that is supported by demonstration and informational handouts. Instructor discusses the benefits of pursed lip and diaphragmatic breathing and detailed demonstration on how to perform both.     Risk Factor Reduction Group instruction that is supported by a PowerPoint presentation. Instructor discusses the definition of a risk factor, different risk factors for pulmonary disease, and how the heart and lungs work  together.   Pulmonary Diseases Group instruction provided by PowerPoint, verbal discussion, and written material to support subject matter. Instructor gives an overview of the different type of pulmonary diseases. There is also a discussion on risk factors and symptoms as well as ways to manage the diseases.   Stress and Energy Conservation Group instruction provided by PowerPoint, verbal discussion, and written material to support subject matter. Instructor gives an overview of stress and the impact it can have on the body. Instructor also reviews ways to reduce stress. There is also a discussion on energy conservation and ways to conserve energy throughout the day.   Warning Signs and Symptoms Group instruction provided by PowerPoint, verbal discussion, and written material to support subject matter. Instructor reviews warning signs and symptoms of stroke, heart attack, cold and flu. Instructor also reviews ways to prevent the spread of infection.   Other Education Group or individual verbal, written, or video instructions that support the educational goals of the pulmonary rehab program.    Knowledge Questionnaire Score:  Knowledge Questionnaire Score - 08/23/23 1110       Knowledge Questionnaire Score   Pre Score 16/18             Core Components/Risk Factors/Patient Goals at Admission:  Personal Goals and Risk Factors at Admission - 08/23/23 1038       Core Components/Risk Factors/Patient Goals on Admission   Improve shortness of breath with ADL's Yes    Intervention Provide education, individualized exercise plan and daily activity instruction to help decrease symptoms of SOB with activities of daily living.    Expected Outcomes Short Term: Improve cardiorespiratory fitness to achieve a reduction of symptoms when performing ADLs;Long Term: Be able to perform more ADLs without symptoms or delay the onset of symptoms             Core Components/Risk Factors/Patient  Goals Review:   Goals and Risk Factor Review     Row Name 09/01/23 0957 09/29/23 1044           Core Components/Risk Factors/Patient Goals Review   Personal Goals Review Improve shortness of breath with ADL's;Develop more efficient breathing techniques such as purse lipped breathing and diaphragmatic breathing and practicing self-pacing with activity. Improve shortness of breath with ADL's;Develop more efficient breathing techniques such as purse lipped breathing and diaphragmatic breathing and practicing self-pacing with activity.  Review [name] is scheduled to start the PR program on 09/05/23. Goal progressing for improving shortness of breath with ADL's. Goal progressing for developing more efficient breathing techniques such as purse lipped breathing and diaphragmatic breathing; and practicing self-pacing with activity. Goal progressing on improving her shortness of breath with ADLs. Goal progressing on developing more efficient breathing techniques such as purse lipped breathing and diaphragmatic breathing; and practicing self-pacing with activity. [name] has remained on RA to keep her sats >88% while exercising. We will continue to monitor Sylvia's progress throughout the program.      Expected Outcomes To improve shortness of breath with ADL's and develop more efficient breathing techniques such as purse lipped breathing and diaphragmatic breathing; and practicing self-pacing with activity. To improve shortness of breath with ADL's and develop more efficient breathing techniques such as purse lipped breathing and diaphragmatic breathing; and practicing self-pacing with activity.               Core Components/Risk Factors/Patient Goals at Discharge (Final Review):   Goals and Risk Factor Review - 09/29/23 1044       Core Components/Risk Factors/Patient Goals Review   Personal Goals Review Improve shortness of breath with ADL's;Develop more efficient breathing techniques such as  purse lipped breathing and diaphragmatic breathing and practicing self-pacing with activity.    Review Goal progressing on improving her shortness of breath with ADLs. Goal progressing on developing more efficient breathing techniques such as purse lipped breathing and diaphragmatic breathing; and practicing self-pacing with activity. [name] has remained on RA to keep her sats >88% while exercising. We will continue to monitor Sylvia's progress throughout the program.    Expected Outcomes To improve shortness of breath with ADL's and develop more efficient breathing techniques such as purse lipped breathing and diaphragmatic breathing; and practicing self-pacing with activity.             ITP Comments: Pt is making expected progress toward Pulmonary Rehab goals after completing 9 session(s). Recommend continued exercise, life style modification, education, and utilization of breathing techniques to increase stamina and strength, while also decreasing shortness of breath with exertion.  Dr. Mechele Collin is Medical Director for Pulmonary Rehab at [facility].

## 2023-10-05 ENCOUNTER — Encounter (HOSPITAL_COMMUNITY)
Admission: RE | Admit: 2023-10-05 | Discharge: 2023-10-05 | Disposition: A | Discharge status: Home / Self Care | Payer: Medicare Other | Source: Ambulatory Visit | Attending: Cardiology | Admitting: Cardiology

## 2023-10-05 ENCOUNTER — Encounter (HOSPITAL_COMMUNITY): Payer: Medicare Other

## 2023-10-05 DIAGNOSIS — I272 Pulmonary hypertension, unspecified: Secondary | ICD-10-CM | POA: Diagnosis not present

## 2023-10-05 NOTE — Progress Notes (Signed)
Daily Session Note  Patient Details  Name: Madison Clements MRN: [id number] Date of Birth: 1945-11-06 Referring Provider:   Doristine Devoid Pulmonary Rehab Walk Test from 08/23/2023 in [facility] for Heart, Vascular, & Lung Health  Referring Provider Turner       Encounter Date: 10/05/2023  Check In:  Session Check In - 10/05/23 1108       Check-In   Supervising physician immediately available to respond to emergencies [facility] MD immediately available    Physician(s) Neila Gear, NP    Location [facility]    Staff Present Essie Hart, RN, Doris Cheadle, MS, ACSM-CEP, Exercise Physiologist;David Manus Gunning, MS, ACSM-CEP, CCRP, Exercise Physiologist;Hareem Surowiec Glenetta Borg, MS, Exercise Physiologist    Virtual Visit No    Medication changes reported     No    Fall or balance concerns reported    No    Tobacco Cessation No Change    Warm-up and Cool-down Performed as group-led instruction    Resistance Training Performed Yes    VAD Patient? No    PAD/SET Patient? No      Pain Assessment   Currently in Pain? No/denies    Pain Score 0-No pain    Multiple Pain Sites No             Capillary Blood Glucose: No results found for this or any previous visit (from the past 24 hours).    Social History   Tobacco Use  Smoking Status Former   Current packs/day: 0.00   Average packs/day: 0.3 packs/day for 10.0 years (2.5 ttl pk-yrs)   Types: Cigarettes   Start date: 09/12/1986   Quit date: 09/12/1996   Years since quitting: 27.0  Smokeless Tobacco Never    Goals Met:  Proper associated with RPD/PD & O2 Sat Independence with exercise equipment Exercise tolerated well No report of concerns or symptoms today Strength training completed today  Goals Unmet:  Not Applicable  Comments: Service time is from 1016 to 1145.    Dr. Mechele Collin is Medical Director for Pulmonary Rehab at [facility].

## 2023-10-09 ENCOUNTER — Encounter: Payer: Self-pay | Admitting: Gastroenterology

## 2023-10-09 ENCOUNTER — Telehealth: Payer: Self-pay | Admitting: Internal Medicine

## 2023-10-09 NOTE — Telephone Encounter (Signed)
Patient called stated she is having difficulty swallowing food is getting stuck. Please advise.

## 2023-10-10 ENCOUNTER — Encounter (HOSPITAL_COMMUNITY): Payer: Medicare Other

## 2023-10-10 ENCOUNTER — Encounter (HOSPITAL_COMMUNITY)
Admission: RE | Admit: 2023-10-10 | Discharge: 2023-10-10 | Disposition: A | Discharge status: Home / Self Care | Payer: Medicare Other | Source: Ambulatory Visit | Attending: Cardiology | Admitting: Cardiology

## 2023-10-10 DIAGNOSIS — I272 Pulmonary hypertension, unspecified: Secondary | ICD-10-CM | POA: Diagnosis not present

## 2023-10-10 NOTE — Telephone Encounter (Signed)
Chart reviewed. OK for direct EGD with dilation in [facility], with me. Thanks, Dr. Marina Goodell

## 2023-10-10 NOTE — Progress Notes (Signed)
Daily Session Note  Patient Details  Name: Madison Clements MRN: [id number] Date of Birth: Mar 31, 1946 Referring Provider:   Doristine Devoid Pulmonary Rehab Walk Test from 08/23/2023 in [facility] for Heart, Vascular, & Lung Health  Referring Provider Turner       Encounter Date: 10/10/2023  Check In:  Session Check In - 10/10/23 1018       Check-In   Supervising physician immediately available to respond to emergencies [facility] MD immediately available    Physician(s) Tereso Newcomer, PA    [facility]    Staff Present Essie Hart, RN, Doris Cheadle, MS, ACSM-CEP, Exercise Physiologist;Tannor Pyon Sonnie Alamo, MS, ACSM-CEP, Exercise Physiologist    Virtual Visit No    Medication changes reported     No    Fall or balance concerns reported    No    Tobacco Cessation No Change    Warm-up and Cool-down Performed as group-led instruction    Resistance Training Performed Yes    VAD Patient? No    PAD/SET Patient? No      Pain Assessment   Currently in Pain? No/denies    Pain Score 0-No pain    Multiple Pain Sites No             Capillary Blood Glucose: No results found for this or any previous visit (from the past 24 hours).    Social History   Tobacco Use  Smoking Status Former   Current packs/day: 0.00   Average packs/day: 0.3 packs/day for 10.0 years (2.5 ttl pk-yrs)   Types: Cigarettes   Start date: 09/12/1986   Quit date: 09/12/1996   Years since quitting: 27.0  Smokeless Tobacco Never    Goals Met:  Proper associated with RPD/PD & O2 Sat Independence with exercise equipment Exercise tolerated well No report of concerns or symptoms today Strength training completed today  Goals Unmet:  Not Applicable  Comments: Service time is from 1009 to 1130.    Dr. Mechele Collin is Medical Director for Pulmonary Rehab at [facility].

## 2023-10-10 NOTE — Telephone Encounter (Signed)
Pt currently scheduled for an OV with Dr. Marina Goodell 11/20/23. States she has had esophageal dilations in the past. She had an episode this past weekend where she got choked on a pork skewer. Pt is wanting to know if she needs an OV or if she could just be scheduled for the procedure. Please advise.

## 2023-10-11 ENCOUNTER — Encounter: Payer: Self-pay | Admitting: Internal Medicine

## 2023-10-11 NOTE — Telephone Encounter (Signed)
[name] please see note below. Pt can be scheduled for a previsit and a direct EGD with dilation in the [facility] per Dr. Marina Goodell.

## 2023-10-12 ENCOUNTER — Encounter (HOSPITAL_COMMUNITY): Payer: Medicare Other

## 2023-10-12 ENCOUNTER — Encounter (HOSPITAL_COMMUNITY)
Admission: RE | Admit: 2023-10-12 | Discharge: 2023-10-12 | Disposition: A | Discharge status: Home / Self Care | Payer: Medicare Other | Source: Ambulatory Visit | Attending: Cardiology | Admitting: Cardiology

## 2023-10-12 DIAGNOSIS — I272 Pulmonary hypertension, unspecified: Secondary | ICD-10-CM | POA: Diagnosis not present

## 2023-10-12 NOTE — Progress Notes (Signed)
Daily Session Note  Patient Details  Name: Madison Clements MRN: [id number] Date of Birth: Feb 05, 1946 Referring Provider:   Doristine Devoid Pulmonary Rehab Walk Test from 08/23/2023 in [facility] for Heart, Vascular, & Lung Health  Referring Provider Turner       Encounter Date: 10/12/2023  Check In:  Session Check In - 10/12/23 1024       Check-In   Supervising physician immediately available to respond to emergencies [facility] MD immediately available    Physician(s) Edd Fabian, NP    [facility]    Staff Present Essie Hart, RN, Doris Cheadle, MS, ACSM-CEP, Exercise Physiologist;Rayna Brenner Katrinka Blazing, RT;Jetta Walker BS, ACSM-CEP, Exercise Physiologist    Virtual Visit No    Medication changes reported     No    Fall or balance concerns reported    No    Tobacco Cessation No Change    Warm-up and Cool-down Performed as group-led instruction    Resistance Training Performed Yes    VAD Patient? No    PAD/SET Patient? No      Pain Assessment   Currently in Pain? No/denies    Multiple Pain Sites No             Capillary Blood Glucose: No results found for this or any previous visit (from the past 24 hours).    Social History   Tobacco Use  Smoking Status Former   Current packs/day: 0.00   Average packs/day: 0.3 packs/day for 10.0 years (2.5 ttl pk-yrs)   Types: Cigarettes   Start date: 09/12/1986   Quit date: 09/12/1996   Years since quitting: 27.0  Smokeless Tobacco Never    Goals Met:  Proper associated with RPD/PD & O2 Sat Independence with exercise equipment Exercise tolerated well No report of concerns or symptoms today Strength training completed today  Goals Unmet:  Not Applicable  Comments: Service time is from 1016 to 1144.    Dr. Mechele Collin is Medical Director for Pulmonary Rehab at [facility].

## 2023-10-17 ENCOUNTER — Ambulatory Visit (AMBULATORY_SURGERY_CENTER): Payer: Medicare Other

## 2023-10-17 ENCOUNTER — Encounter (HOSPITAL_COMMUNITY): Payer: Medicare Other

## 2023-10-17 ENCOUNTER — Encounter (HOSPITAL_COMMUNITY)
Admission: RE | Admit: 2023-10-17 | Discharge: 2023-10-17 | Disposition: A | Discharge status: Home / Self Care | Payer: Medicare Other | Source: Ambulatory Visit | Attending: Cardiology | Admitting: Cardiology

## 2023-10-17 VITALS — Wt 148.4 lb

## 2023-10-17 VITALS — Ht 63.0 in | Wt 148.0 lb

## 2023-10-17 DIAGNOSIS — I272 Pulmonary hypertension, unspecified: Secondary | ICD-10-CM | POA: Diagnosis not present

## 2023-10-17 DIAGNOSIS — K219 Gastro-esophageal reflux disease without esophagitis: Secondary | ICD-10-CM

## 2023-10-17 DIAGNOSIS — R1310 Dysphagia, unspecified: Secondary | ICD-10-CM

## 2023-10-17 NOTE — Progress Notes (Signed)
Daily Session Note  Patient Details  Name: Madison Clements MRN: [id number] Date of Birth: August 03, 1946 Referring Provider:   Doristine Devoid Pulmonary Rehab Walk Test from 08/23/2023 in [facility] for Heart, Vascular, & Lung Health  Referring Provider Turner       Encounter Date: 10/17/2023  Check In:  Session Check In - 10/17/23 1023       Check-In   Supervising physician immediately available to respond to emergencies [facility] MD immediately available    Physician(s) Edd Fabian, NP    [facility]    Staff Present Essie Hart, RN, Doris Cheadle, MS, ACSM-CEP, Exercise Physiologist;Casey Erin Sons BS, ACSM-CEP, Exercise Physiologist    Virtual Visit No    Medication changes reported     No    Fall or balance concerns reported    No    Tobacco Cessation No Change    Warm-up and Cool-down Performed as group-led instruction    Resistance Training Performed Yes    VAD Patient? No    PAD/SET Patient? No      Pain Assessment   Currently in Pain? No/denies    Multiple Pain Sites No             Capillary Blood Glucose: No results found for this or any previous visit (from the past 24 hours).   Exercise Prescription Changes - 10/17/23 1200       Response to Exercise   Blood Pressure (Admit) 118/60    Blood Pressure (Exercise) 160/60    Blood Pressure (Exit) 102/60    Heart Rate (Admit) 67 bpm    Heart Rate (Exercise) 96 bpm    Heart Rate (Exit) 73 bpm    Oxygen Saturation (Admit) 97 %    Oxygen Saturation (Exercise) 96 %    Oxygen Saturation (Exit) 98 %    Rating of Perceived Exertion (Exercise) 13    Perceived Dyspnea (Exercise) 1    Duration Continue with 30 min of aerobic exercise without signs/symptoms of physical distress.    Intensity THRR unchanged      Progression   Progression Continue to progress workloads to maintain intensity without signs/symptoms of physical distress.      Resistance  Training   Training Prescription Yes    Weight red bands    Reps 10-15    Time 10 Minutes      NuStep   Level 2    SPM 95    Minutes 15    METs 3      Recumbant Elliptical   Level 3    Minutes 15    METs 3.8             Social History   Tobacco Use  Smoking Status Former   Current packs/day: 0.00   Average packs/day: 0.3 packs/day for 10.0 years (2.5 ttl pk-yrs)   Types: Cigarettes   Start date: 09/12/1986   Quit date: 09/12/1996   Years since quitting: 27.1  Smokeless Tobacco Never    Goals Met:  Independence with exercise equipment Exercise tolerated well No report of concerns or symptoms today Strength training completed today  Goals Unmet:  Not Applicable  Comments: Service time is from 1016 to 1135.    Dr. Mechele Collin is Medical Director for Pulmonary Rehab at [facility].

## 2023-10-17 NOTE — Progress Notes (Signed)
No egg or soy allergy known to patient  No issues known to pt with past sedation with any surgeries or procedures Patient denies ever being told they had issues or difficulty with intubation  No FH of Malignant Hyperthermia Pt is not on diet pills Pt is not on  home 02  OSA - CPAP  Pt is not on blood thinners  Pt denies issues with constipation  No A fib or A flutter. Hx SVT.  Have any cardiac testing pending-- ECHO in march 6 month follow up  LOA: independent   PV completed with patient. Prep instructions sent via mychart and home address.

## 2023-10-19 ENCOUNTER — Encounter (HOSPITAL_COMMUNITY): Admission: RE | Admit: 2023-10-19 | Payer: Medicare Other | Source: Ambulatory Visit

## 2023-10-19 ENCOUNTER — Encounter (HOSPITAL_COMMUNITY): Payer: Medicare Other

## 2023-10-24 ENCOUNTER — Encounter (HOSPITAL_COMMUNITY)
Admission: RE | Admit: 2023-10-24 | Discharge: 2023-10-24 | Disposition: A | Discharge status: Home / Self Care | Payer: Medicare Other | Source: Ambulatory Visit | Attending: Cardiology

## 2023-10-24 ENCOUNTER — Encounter (HOSPITAL_COMMUNITY): Payer: Medicare Other

## 2023-10-24 DIAGNOSIS — I272 Pulmonary hypertension, unspecified: Secondary | ICD-10-CM | POA: Diagnosis not present

## 2023-10-24 NOTE — Progress Notes (Signed)
Daily Session Note  Patient Details  Name: Madison Clements MRN: [id number] Date of Birth: May 11, 1946 Referring Provider:   Doristine Devoid Pulmonary Rehab Walk Test from 08/23/2023 in [facility] for Heart, Vascular, & Lung Health  Referring Provider Turner       Encounter Date: 10/24/2023  Check In:  Session Check In - 10/24/23 1029       Check-In   Supervising physician immediately available to respond to emergencies [facility] MD immediately available    Physician(s) Eligha Bridegroom ,NP    [facility]    Staff Present Essie Hart, RN, Doris Cheadle, MS, ACSM-CEP, Exercise Physiologist;Casey Erin Sons BS, ACSM-CEP, Exercise Physiologist    Virtual Visit No    Medication changes reported     No    Fall or balance concerns reported    No    Tobacco Cessation No Change    Warm-up and Cool-down Performed as group-led instruction    Resistance Training Performed Yes    VAD Patient? No    PAD/SET Patient? No      Pain Assessment   Currently in Pain? No/denies             Capillary Blood Glucose: No results found for this or any previous visit (from the past 24 hours).    Social History   Tobacco Use  Smoking Status Former   Current packs/day: 0.00   Average packs/day: 0.3 packs/day for 10.0 years (2.5 ttl pk-yrs)   Types: Cigarettes   Start date: 09/12/1986   Quit date: 09/12/1996   Years since quitting: 27.1  Smokeless Tobacco Never    Goals Met:  Independence with exercise equipment Exercise tolerated well No report of concerns or symptoms today Strength training completed today  Goals Unmet:  Not Applicable  Comments: Service time is from 1015 to 1138.    Dr. Mechele Collin is Medical Director for Pulmonary Rehab at [facility].

## 2023-10-26 ENCOUNTER — Encounter (HOSPITAL_COMMUNITY)
Admission: RE | Admit: 2023-10-26 | Discharge: 2023-10-26 | Disposition: A | Discharge status: Home / Self Care | Payer: Medicare Other | Source: Ambulatory Visit | Attending: Cardiology

## 2023-10-26 ENCOUNTER — Encounter (HOSPITAL_COMMUNITY): Payer: Medicare Other

## 2023-10-26 VITALS — Wt 147.0 lb

## 2023-10-26 DIAGNOSIS — I272 Pulmonary hypertension, unspecified: Secondary | ICD-10-CM | POA: Diagnosis not present

## 2023-10-26 NOTE — Progress Notes (Signed)
Daily Session Note  Patient Details  Name: Madison Clements MRN: [id number] Date of Birth: 1945-10-10 Referring Provider:   Doristine Devoid Pulmonary Rehab Walk Test from 08/23/2023 in [facility] for Heart, Vascular, & Lung Health  Referring Provider Turner       Encounter Date: 10/26/2023  Check In:  Session Check In - 10/26/23 1023       Check-In   Supervising physician immediately available to respond to emergencies [facility] MD immediately available    Physician(s) Rise Paganini, NP    [facility]    Staff Present Essie Hart, RN, Doris Cheadle, MS, ACSM-CEP, Exercise Physiologist;Casey Chester Holstein, MS, Exercise Physiologist    Virtual Visit No    Medication changes reported     No    Fall or balance concerns reported    No    Tobacco Cessation No Change    Warm-up and Cool-down Performed as group-led instruction    Resistance Training Performed Yes    VAD Patient? No    PAD/SET Patient? No      Pain Assessment   Currently in Pain? No/denies    Multiple Pain Sites No             Capillary Blood Glucose: No results found for this or any previous visit (from the past 24 hours).    Social History   Tobacco Use  Smoking Status Former   Current packs/day: 0.00   Average packs/day: 0.3 packs/day for 10.0 years (2.5 ttl pk-yrs)   Types: Cigarettes   Start date: 09/12/1986   Quit date: 09/12/1996   Years since quitting: 27.1  Smokeless Tobacco Never    Goals Met:  Proper associated with RPD/PD & O2 Sat Independence with exercise equipment Exercise tolerated well No report of concerns or symptoms today Strength training completed today  Goals Unmet:  Not Applicable  Comments: Service time is from 1010 to 1147.    Dr. Mechele Collin is Medical Director for Pulmonary Rehab at [facility].

## 2023-10-29 DIAGNOSIS — D519 Vitamin B12 deficiency anemia, unspecified: Secondary | ICD-10-CM | POA: Insufficient documentation

## 2023-10-29 NOTE — Assessment & Plan Note (Deleted)
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 Biopsy 08/11/21 confirmed invasive mammary carcinoma. -left lumpectomy on 09/09/21 by Dr. 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 No nodes were removed due to her age. -she received adjuvant RT, completed in 11/2021 -she started adjuvant tamoxifen in 12/2021, but could not tolerate. She subsequently tried anastrozole and still can not tolerate. She stopped in 04/2022 -Screening mammogram in May 2024 were negative. -continue breast cancer surveillance

## 2023-10-29 NOTE — Assessment & Plan Note (Deleted)
-  Patient developed anemia with hemoglobin 9.2 in October 2024, anemia workup showed low B12 level at 162, intrinsic factor was negative -Patient has been on B12 injection since then with loading dose initially

## 2023-10-29 NOTE — Assessment & Plan Note (Deleted)
-  Her previous DEXA on 11/15/19 showed osteopenia (T-score -1.5). -Dexa 05/2022 showed T score -3.7 at forearm, -2.1 at hip; bisphosphonate was discussed with her last time. -she started Zometa in January 2024, plan to continue every 6 months for a total of 2 years.

## 2023-10-30 ENCOUNTER — Inpatient Hospital Stay: Payer: Medicare Other

## 2023-10-30 ENCOUNTER — Inpatient Hospital Stay: Payer: Medicare Other | Admitting: Hematology

## 2023-10-30 ENCOUNTER — Telehealth: Payer: Self-pay | Admitting: Hematology

## 2023-10-30 DIAGNOSIS — Z170 Estrogen receptor positive status [ER+]: Secondary | ICD-10-CM

## 2023-10-30 DIAGNOSIS — M810 Age-related osteoporosis without current pathological fracture: Secondary | ICD-10-CM

## 2023-10-30 DIAGNOSIS — D519 Vitamin B12 deficiency anemia, unspecified: Secondary | ICD-10-CM

## 2023-10-30 NOTE — Telephone Encounter (Signed)
Patient is aware of rescheduled appointment times/dates 

## 2023-10-31 ENCOUNTER — Encounter (HOSPITAL_COMMUNITY)
Admission: RE | Admit: 2023-10-31 | Discharge: 2023-10-31 | Disposition: A | Discharge status: Home / Self Care | Payer: Medicare Other | Source: Ambulatory Visit | Attending: Cardiology | Admitting: Cardiology

## 2023-10-31 ENCOUNTER — Encounter (HOSPITAL_COMMUNITY): Payer: Medicare Other

## 2023-10-31 DIAGNOSIS — I272 Pulmonary hypertension, unspecified: Secondary | ICD-10-CM

## 2023-11-01 NOTE — Progress Notes (Signed)
Pulmonary Individual Treatment Plan  Patient Details  Name: Madison Clements MRN: [id number] Date of Birth: 02/18/1946 Referring Provider:   Doristine Devoid Pulmonary Rehab Walk Test from 08/23/2023 in [facility]  Referring Provider Turner       Initial Encounter Date:  Flowsheet Row Pulmonary Rehab Walk Test from 08/23/2023 in [facility]  Date 08/23/23       Visit Diagnosis: Pulmonary hypertension (HCC)  Patient's Home Medications on Admission:   Current Outpatient Medications:    aspirin EC 81 MG tablet, Take 1 tablet (81 mg total) by mouth daily. Swallow whole., Disp: 30 tablet, Rfl: 12   diltiazem (CARDIZEM CD) 300 MG 24 hr capsule, Take 1 capsule (300 mg total) by mouth daily., Disp: 90 capsule, Rfl: 3   furosemide (LASIX) 40 MG tablet, Take 40 mg by mouth daily., Disp: , Rfl:    lisinopril (ZESTRIL) 10 MG tablet, Take 10 mg by mouth daily., Disp: , Rfl:    omeprazole (PRILOSEC) 20 MG capsule, Take 20 mg by mouth daily., Disp: , Rfl:    Prenatal Vit-Fe Fumarate-FA (PRENATAL VITAMIN PO), Take by mouth daily. Prenatal w/folic acid po daily, Disp: , Rfl:    Probiotic Product (ADVANCED PROBIOTIC PO), Take 1 capsule by mouth daily., Disp: , Rfl:    rosuvastatin (CRESTOR) 40 MG tablet, TAKE 1 TABLET (40 MG TOTAL) BY MOUTH AT BEDTIME., Disp: 90 tablet, Rfl: 3  Past Medical History: Past Medical History:  Diagnosis Date   Allergy 1990   Morphine following surgery   Arthritis    generalized   Atypical mole 02/24/2015   RGHT LATERAL THIGH MODERATE   Atypical mole 01/20/2022   Left Breast (mild)   Atypical mole 01/20/2022   Mid Back (mild)   Atypical nevi 02/24/2015   RIGHT NECK MILD   Atypical nevi 08/25/2015   LEFT UPPER ARM MILD   Atypical nevi 08/25/2015   LEFT LATERAL FOREARM MILD/FREE   Atypical nevi 02/23/2016   LEFT THIGH MODERATE/FREE    Atypical nevi 02/23/2016   LEFT MEDIAL LEG MODERATE/FREE   Atypical nevi 02/23/2016   LEFT UPPER BACK MILD /FREE   Atypical nevi 08/23/2016   RIGHT ANT PROX THIGH MILD /FREE   Atypical nevi 07/25/2018   MID LOWER BACK MODERATE   Atypical nevi 01/22/2019   LEFT MID BACK MILD /FREE   Atypical nevi 01/22/2019   LEFT NECK MILD   Atypical nevi 08/06/2019   LEFT INNER BREAST MODERATE W/S   Atypical nevi 08/06/2019   LEFT OUTER SIDE MILD/FREE   Blood transfusion without reported diagnosis    hx of   Breast cancer (HCC) 2022   CAD (coronary artery disease), native coronary artery 02/07/2019   Minimal CAD at the time of cath in 2016 in [facility]   Family history of breast cancer 08/19/2021   Family history of pancreatic cancer 08/19/2021   Fatty liver 05/21/2020   Gastric polyps 10/02/2018   EGD 03/2015; benign   GERD (gastroesophageal reflux disease) 10/02/2018   on meds   History of melanoma 10/02/2018   Abdomen 2012; 2013; s/p local excisions.    Hyperlipidemia    on meds   Hypertension    on meds   Melanoma (HCC) 2012   MM- central lower abdomen- (ECX) [facility]   Osteopenia    Personal history of radiation therapy    Polyp of colon,  adenomatous 10/02/2018   Colonoscopy 03/2015; recheck in 2021   PONV (postoperative nausea and vomiting)    Pulmonary hypertension, primary (HCC) 10/02/2018   SCCA (squamous cell carcinoma) of skin 09/18/2017   RIGHT ANT. DISTAL LOWER LEG TREATED BY DR. Leda Quail   Sleep apnea    Sleep apnea 05/26/2020   uses CPAP   Tricuspid regurgitation    moderate by echo 04/2023    Tobacco Use: Social History   Tobacco Use  Smoking Status Former   Current packs/day: 0.00   Average packs/day: 0.3 packs/day for 10.0 years (2.5 ttl pk-yrs)   Types: Cigarettes   Start date: 09/12/1986   Quit date: 09/12/1996   Years since quitting: 27.1  Smokeless Tobacco Never    Labs: Review Flowsheet  More data exists      Latest Ref  Rng & Units 03/09/2021 07/09/2021 05/18/2022 03/08/2023 06/20/2023  Labs for ITP Cardiac and Pulmonary Rehab  Cholestrol 100 - 199 mg/dL 409  811  914  782  -  LDL (calc) 0 - 99 mg/dL 55  66  63  61  -  HDL-C >39 mg/dL 66  95.62  48  55  -  Trlycerides 0 - 149 mg/dL 130  865.7  846  962  -  Bicarbonate 20.0 - 28.0 mmol/L - - - - 21.6  21.9   TCO2 22 - 32 mmol/L - - - - 23  23   Acid-base deficit 0.0 - 2.0 mmol/L - - - - 3.0  3.0   O2 Saturation % - - - - 62  62     Details       Multiple values from one day are sorted in reverse-chronological order         Capillary Blood Glucose: No results found for: "GLUCAP"   Pulmonary Assessment Scores:  Pulmonary Assessment Scores     Row Name 08/23/23 1110         ADL UCSD   ADL Phase Entry     SOB Score total 53       CAT Score   CAT Score 17       mMRC Score   mMRC Score 4             UCSD: Self-administered rating of dyspnea associated with activities of daily living (ADLs) 6-point scale (0 = "not at all" to 5 = "maximal or unable to do because of breathlessness")  Scoring Scores range from 0 to 120.  Minimally important difference is 5 units  CAT: CAT can identify the health impairment of COPD patients and is better correlated with disease progression.  CAT has a scoring range of zero to 40. The CAT score is classified into four groups of low (less than 10), medium (10 - 20), high (21-30) and very high (31-40) based on the impact level of disease on health status. A CAT score over 10 suggests significant symptoms.  A worsening CAT score could be explained by an exacerbation, poor medication adherence, poor inhaler technique, or progression of COPD or comorbid conditions.  CAT MCID is 2 points  mMRC: mMRC (Modified Medical Research Council) Dyspnea Scale is used to assess the degree of baseline functional disability in patients of respiratory disease due to dyspnea. No minimal important difference is established. A  decrease in score of 1 point or greater is considered a positive change.   Pulmonary Function Assessment:  Pulmonary Function Assessment - 08/23/23 1040       Breath  Bilateral Breath Sounds Clear    Shortness of Breath Yes;Limiting activity             Exercise Target Goals: Exercise Program Goal: Individual exercise prescription set using results from initial 6 min walk test and THRR while considering  patient's activity barriers and safety.   Exercise Prescription Goal: Initial exercise prescription builds to 30-45 minutes a day of aerobic activity, 2-3 days per week.  Home exercise guidelines will be given to patient during program as part of exercise prescription that the participant will acknowledge.  Activity Barriers & Risk Stratification:  Activity Barriers & Cardiac Risk Stratification - 08/23/23 1038       Activity Barriers & Cardiac Risk Stratification   Activity Barriers Arthritis;Deconditioning;Muscular Weakness;Shortness of Breath   needs right knee replacement   Cardiac Risk Stratification Moderate             6 Minute Walk:  6 Minute Walk     Row Name 08/23/23 1114         6 Minute Walk   Phase Initial     Distance 1129 feet     Walk Time 6 minutes     # of Rest Breaks 0     MPH 2.14     METS 2.12     RPE 13     Perceived Dyspnea  1     VO2 Peak 7.42     Symptoms No     Resting HR 67 bpm     Resting BP 118/58     Resting Oxygen Saturation  97 %     Exercise Oxygen Saturation  during 6 min walk 95 %     Max Ex. HR 94 bpm     Max Ex. BP 158/56     2 Minute Post BP 124/58       Interval HR   1 Minute HR 83     2 Minute HR 91     3 Minute HR 94     4 Minute HR 93     5 Minute HR 94     6 Minute HR 91     2 Minute Post HR 75     Interval Heart Rate? Yes       Interval Oxygen   Interval Oxygen? Yes     Baseline Oxygen Saturation % 97 %     1 Minute Oxygen Saturation % 96 %     1 Minute Liters of Oxygen 0 L     2 Minute Oxygen  Saturation % 96 %     2 Minute Liters of Oxygen 0 L     3 Minute Oxygen Saturation % 96 %     3 Minute Liters of Oxygen 0 L     4 Minute Oxygen Saturation % 95 %     4 Minute Liters of Oxygen 0 L     5 Minute Oxygen Saturation % 95 %     5 Minute Liters of Oxygen 0 L     6 Minute Oxygen Saturation % 95 %     6 Minute Liters of Oxygen 0 L     2 Minute Post Oxygen Saturation % 98 %     2 Minute Post Liters of Oxygen 0 L              Oxygen Initial Assessment:  Oxygen Initial Assessment - 08/23/23 1039       Home Oxygen   Home Oxygen Device None  Sleep Oxygen Prescription CPAP    Home Exercise Oxygen Prescription None    Home Resting Oxygen Prescription None      Initial 6 min Walk   Oxygen Used None      Program Oxygen Prescription   Program Oxygen Prescription None      Intervention   Short Term Goals To learn and understand importance of monitoring SPO2 with pulse oximeter and demonstrate accurate use of the pulse oximeter.;To learn and understand importance of maintaining oxygen saturations>88%;To learn and demonstrate proper pursed lip breathing techniques or other breathing techniques.     Long  Term Goals Maintenance of O2 saturations>88%;Verbalizes importance of monitoring SPO2 with pulse oximeter and return demonstration;Exhibits proper breathing techniques, such as pursed lip breathing or other method taught during program session             Oxygen Re-Evaluation:  Oxygen Re-Evaluation     Row Name 08/23/23 1039 09/29/23 1106 10/23/23 1142         Program Oxygen Prescription   Program Oxygen Prescription -- None None       Home Oxygen   Home Oxygen Device -- None None     Sleep Oxygen Prescription -- CPAP CPAP     Home Exercise Oxygen Prescription -- None None     Home Resting Oxygen Prescription -- None None       Goals/Expected Outcomes   Short Term Goals -- To learn and understand importance of monitoring SPO2 with pulse oximeter and  demonstrate accurate use of the pulse oximeter.;To learn and understand importance of maintaining oxygen saturations>88%;To learn and demonstrate proper pursed lip breathing techniques or other breathing techniques.  To learn and understand importance of monitoring SPO2 with pulse oximeter and demonstrate accurate use of the pulse oximeter.;To learn and understand importance of maintaining oxygen saturations>88%;To learn and demonstrate proper pursed lip breathing techniques or other breathing techniques.      Long  Term Goals -- Maintenance of O2 saturations>88%;Verbalizes importance of monitoring SPO2 with pulse oximeter and return demonstration;Exhibits proper breathing techniques, such as pursed lip breathing or other method taught during program session Maintenance of O2 saturations>88%;Verbalizes importance of monitoring SPO2 with pulse oximeter and return demonstration;Exhibits proper breathing techniques, such as pursed lip breathing or other method taught during program session     Goals/Expected Outcomes Compliance and understanding of oxygen saturation monitoring and breathing techniques to decrease shortness of breath. Compliance and understanding of oxygen saturation monitoring and breathing techniques to decrease shortness of breath. Compliance and understanding of oxygen saturation monitoring and breathing techniques to decrease shortness of breath.              Oxygen Discharge (Final Oxygen Re-Evaluation):  Oxygen Re-Evaluation - 10/23/23 1142       Program Oxygen Prescription   Program Oxygen Prescription None      Home Oxygen   Home Oxygen Device None    Sleep Oxygen Prescription CPAP    Home Exercise Oxygen Prescription None    Home Resting Oxygen Prescription None      Goals/Expected Outcomes   Short Term Goals To learn and understand importance of monitoring SPO2 with pulse oximeter and demonstrate accurate use of the pulse oximeter.;To learn and understand importance of  maintaining oxygen saturations>88%;To learn and demonstrate proper pursed lip breathing techniques or other breathing techniques.     Long  Term Goals Maintenance of O2 saturations>88%;Verbalizes importance of monitoring SPO2 with pulse oximeter and return demonstration;Exhibits proper breathing techniques, such as pursed lip  breathing or other method taught during program session    Goals/Expected Outcomes Compliance and understanding of oxygen saturation monitoring and breathing techniques to decrease shortness of breath.             Initial Exercise Prescription:  Initial Exercise Prescription - 08/23/23 1100       Date of Initial Exercise RX and Referring Provider   Date 08/23/23    Referring Provider Turner    Expected Discharge Date 11/16/23      NuStep   Level 1    SPM 60    Minutes 15    METs 2      Recumbant Elliptical   Level 1    RPM 40    Watts 20    Minutes 15    METs 2      Prescription Details   Frequency (times per week) 2    Duration Progress to 30 minutes of continuous aerobic without signs/symptoms of physical distress      Intensity   THRR 40-80% of Max Heartrate 56-113    Ratings of Perceived Exertion 11-13    Perceived Dyspnea 0-4      Progression   Progression Continue to progress workloads to maintain intensity without signs/symptoms of physical distress.      Resistance Training   Training Prescription Yes    Weight blue bands    Reps 10-15             Perform Capillary Blood Glucose checks as needed.  Exercise Prescription Changes:   Exercise Prescription Changes     Row Name 09/05/23 1200 09/19/23 1100 10/03/23 1200 10/17/23 1200 10/31/23 1100     Response to Exercise   Blood Pressure (Admit) 112/58 124/60 126/60 118/60 102/72   Blood Pressure (Exercise) 180/60 188/60 144/64 160/60 --   Blood Pressure (Exit) 110/50 136/64 128/52 102/60 106/54   Heart Rate (Admit) 67 bpm 71 bpm 66 bpm 67 bpm 67 bpm   Heart Rate (Exercise)  93 bpm 99 bpm 91 bpm 96 bpm 92 bpm   Heart Rate (Exit) 71 bpm 72 bpm 70 bpm 73 bpm 71 bpm   Oxygen Saturation (Admit) 97 % 98 % 97 % 97 % 99 %   Oxygen Saturation (Exercise) 93 % 95 % 96 % 96 % 98 %   Oxygen Saturation (Exit) 97 % 98 % 96 % 98 % 98 %   Rating of Perceived Exertion (Exercise) 13 13 12 13 13    Perceived Dyspnea (Exercise) 1 1 1 1 1    Duration Continue with 30 min of aerobic exercise without signs/symptoms of physical distress. Continue with 30 min of aerobic exercise without signs/symptoms of physical distress. Continue with 30 min of aerobic exercise without signs/symptoms of physical distress. Continue with 30 min of aerobic exercise without signs/symptoms of physical distress. Continue with 30 min of aerobic exercise without signs/symptoms of physical distress.   Intensity THRR unchanged THRR unchanged THRR unchanged THRR unchanged THRR unchanged     Progression   Progression Continue to progress workloads to maintain intensity without signs/symptoms of physical distress. Continue to progress workloads to maintain intensity without signs/symptoms of physical distress. Continue to progress workloads to maintain intensity without signs/symptoms of physical distress. Continue to progress workloads to maintain intensity without signs/symptoms of physical distress. Continue to progress workloads to maintain intensity without signs/symptoms of physical distress.   Average METs 2.7 -- -- -- 4     Resistance Training   Training Prescription Yes Yes Yes Yes  Yes   Weight red bands red bands red bands red bands red bands   Reps 10-15 10-15 10-15 10-15 10-15   Time 10 Minutes 10 Minutes 10 Minutes 10 Minutes 10 Minutes     NuStep   Level 1 2 2 2 3    SPM 50 -- 115 95 93   Minutes 15 15 15 15 15    METs 2.3 2.9 3.4 3 3      Recumbant Elliptical   Level 1 2 2 3 3    RPM 40 -- -- -- 68   Watts -- -- -- -- 70   Minutes 15 15 15 15 15    METs 3.2 4.6 5.4 3.8 5            Exercise  Comments:   Exercise Comments     Row Name 09/05/23 1208           Exercise Comments Pt completed first day of group exercise. She exercised on the recumbent elliptical for 15 min, level 1, METs 3.2. she then exercised on the recumbent stepper for 15 min, level 1, METs 2.3. Pt tolerated well and was able to perform warm up and cooldown exercises standing, including squats. Discussed METs with good reception.                Exercise Goals and Review:   Exercise Goals     Row Name 08/23/23 1039             Exercise Goals   Increase Physical Activity Yes       Intervention Provide advice, education, support and counseling about physical activity/exercise needs.;Develop an individualized exercise prescription for aerobic and resistive training based on initial evaluation findings, risk stratification, comorbidities and participant's personal goals.       Expected Outcomes Short Term: Attend rehab on a regular basis to increase amount of physical activity.;Long Term: Add in home exercise to make exercise part of routine and to increase amount of physical activity.;Long Term: Exercising regularly at least 3-5 days a week.       Increase Strength and Stamina Yes       Intervention Provide advice, education, support and counseling about physical activity/exercise needs.;Develop an individualized exercise prescription for aerobic and resistive training based on initial evaluation findings, risk stratification, comorbidities and participant's personal goals.       Expected Outcomes Short Term: Increase workloads from initial exercise prescription for resistance, speed, and METs.;Short Term: Perform resistance training exercises routinely during rehab and add in resistance training at home;Long Term: Improve cardiorespiratory fitness, muscular endurance and strength as measured by increased METs and functional capacity ( )       Able to understand and use rate of perceived exertion (RPE)  scale Yes       Intervention Provide education and explanation on how to use RPE scale       Expected Outcomes Short Term: Able to use RPE daily in rehab to express subjective intensity level;Long Term:  Able to use RPE to guide intensity level when exercising independently       Able to understand and use Dyspnea scale Yes       Intervention Provide education and explanation on how to use Dyspnea scale       Expected Outcomes Short Term: Able to use Dyspnea scale daily in rehab to express subjective sense of shortness of breath during exertion;Long Term: Able to use Dyspnea scale to guide intensity level when exercising independently  Knowledge and understanding of Target Heart Rate Range (THRR) Yes       Intervention Provide education and explanation of THRR including how the numbers were predicted and where they are located for reference       Expected Outcomes Short Term: Able to state/look up THRR;Long Term: Able to use THRR to govern intensity when exercising independently;Short Term: Able to use daily as guideline for intensity in rehab       Understanding of Exercise Prescription Yes       Intervention Provide education, explanation, and written materials on patient's individual exercise prescription       Expected Outcomes Short Term: Able to explain program exercise prescription;Long Term: Able to explain home exercise prescription to exercise independently                Exercise Goals Re-Evaluation :  Exercise Goals Re-Evaluation     Row Name 08/28/23 0735 09/29/23 1103 10/23/23 1135         Exercise Goal Re-Evaluation   Exercise Goals Review Increase Physical Activity;Able to understand and use Dyspnea scale;Understanding of Exercise Prescription;Increase Strength and Stamina;Knowledge and understanding of Target Heart Rate Range (THRR);Able to understand and use rate of perceived exertion (RPE) scale Increase Physical Activity;Able to understand and use Dyspnea  scale;Understanding of Exercise Prescription;Increase Strength and Stamina;Knowledge and understanding of Target Heart Rate Range (THRR);Able to understand and use rate of perceived exertion (RPE) scale Increase Physical Activity;Able to understand and use Dyspnea scale;Understanding of Exercise Prescription;Increase Strength and Stamina;Knowledge and understanding of Target Heart Rate Range (THRR);Able to understand and use rate of perceived exertion (RPE) scale     Comments Madison Clements is scheduled to start exercise on 12/24. Will continue to monitor and progress as able. Madison Clements has completed 8 exercise sessions. She exercises for 15 min on the recumbent elliptical and Nustep. She averages 4.9 METs at level 2 on the recumbent elliptical and 3.2 METs at level 2 on the Nustep. Madison Clements performs the warmup and cooldown standing without limitations. She has increased her workload for both exercise modes as METs have significantly increased on the recumbent elliptical and Nustep. Madison Clements toterates progressions well and seems to like rehab. Will continue to monitor and progress as able. Madison Clements has completed 13 exercise sessions. She exercises for 15 min on the recumbent elliptical and Nustep. She averages 3.8 METs at level 3 on the recumbent elliptical and 3.0 METs at level 3 on the Nustep. Madison Clements performs the warmup and cooldown standing without limitations. Madison Clements continues her gradual progress on both exercise modes. She has missed a couple sessions due to personal commitments. Will continue to monitor and progress as able.     Expected Outcomes Through exercise at rehab and home, the patient will decrease shortness of breath with daily activities and feel confident in carrying out an exercise regimen at home. Through exercise at rehab and home, the patient will decrease shortness of breath with daily activities and feel confident in carrying out an exercise regimen at home. Through exercise at rehab and home, the patient  will decrease shortness of breath with daily activities and feel confident in carrying out an exercise regimen at home.              Discharge Exercise Prescription (Final Exercise Prescription Changes):  Exercise Prescription Changes - 10/31/23 1100       Response to Exercise   Blood Pressure (Admit) 102/72    Blood Pressure (Exit) 106/54    Heart Rate (Admit) 67  bpm    Heart Rate (Exercise) 92 bpm    Heart Rate (Exit) 71 bpm    Oxygen Saturation (Admit) 99 %    Oxygen Saturation (Exercise) 98 %    Oxygen Saturation (Exit) 98 %    Rating of Perceived Exertion (Exercise) 13    Perceived Dyspnea (Exercise) 1    Duration Continue with 30 min of aerobic exercise without signs/symptoms of physical distress.    Intensity THRR unchanged      Progression   Progression Continue to progress workloads to maintain intensity without signs/symptoms of physical distress.    Average METs 4      Resistance Training   Training Prescription Yes    Weight red bands    Reps 10-15    Time 10 Minutes      NuStep   Level 3    SPM 93    Minutes 15    METs 3      Recumbant Elliptical   Level 3    RPM 68    Watts 70    Minutes 15    METs 5             Nutrition:  Target Goals: Understanding of nutrition guidelines, daily intake of sodium 1500mg , cholesterol 200mg , calories 30% from fat and 7% or less from saturated fats, daily to have 5 or more servings of fruits and vegetables.  Biometrics:  Pre Biometrics - 08/23/23 1024       Pre Biometrics   Grip Strength 30 kg              Nutrition Therapy Plan and Nutrition Goals:   Nutrition Assessments:  MEDIFICTS Score Key: >=70 Need to make dietary changes  40-70 Heart Healthy Diet <= 40 Therapeutic Level Cholesterol Diet   Picture Your Plate Scores: <53 Unhealthy dietary pattern with much room for improvement. 41-50 Dietary pattern unlikely to meet recommendations for good health and room for  improvement. 51-60 More healthful dietary pattern, with some room for improvement.  >60 Healthy dietary pattern, although there may be some specific behaviors that could be improved.    Nutrition Goals Re-Evaluation:   Nutrition Goals Discharge (Final Nutrition Goals Re-Evaluation):   Psychosocial: Target Goals: Acknowledge presence or absence of significant depression and/or stress, maximize coping skills, provide positive support system. Participant is able to verbalize types and ability to use techniques and skills needed for reducing stress and depression.  Initial Review & Psychosocial Screening:  Initial Psych Review & Screening - 08/23/23 1036       Initial Review   Current issues with None Identified      Family Dynamics   Good Support System? Yes    Comments Has friends and family as her support      Barriers   Psychosocial barriers to participate in program There are no identifiable barriers or psychosocial needs.      Screening Interventions   Interventions Encouraged to exercise             Quality of Life Scores:  Scores of 19 and below usually indicate a poorer quality of life in these areas.  A difference of  2-3 points is a clinically meaningful difference.  A difference of 2-3 points in the total score of the Quality of Life Index has been associated with significant improvement in overall quality of life, self-image, physical symptoms, and general health in studies assessing change in quality of life.  PHQ-9: Review Flowsheet  More data exists  08/23/2023 07/14/2023 05/24/2023 04/28/2023 04/11/2023  Depression screen PHQ 2/9  Decreased Interest 0 0 0 0 0  Down, Depressed, Hopeless 0 0 0 0 0  PHQ - 2 Score 0 0 0 0 0  Altered sleeping 0 - - - -  Tired, decreased energy 1 - - - -  Change in appetite 0 - - - -  Feeling bad or failure about yourself  0 - - - -  Trouble concentrating 0 - - - -  Moving slowly or fidgety/restless 0 - - - -  Suicidal  thoughts 0 - - - -  PHQ-9 Score 1 - - - -  Difficult doing work/chores Not difficult at all - - - -   Interpretation of Total Score  Total Score Depression Severity:  1-4 = Minimal depression, 5-9 = Mild depression, 10-14 = Moderate depression, 15-19 = Moderately severe depression, 20-27 = Severe depression   Psychosocial Evaluation and Intervention:  Psychosocial Evaluation - 08/23/23 1037       Psychosocial Evaluation & Interventions   Interventions Encouraged to exercise with the program and follow exercise prescription    Comments Madison Clements denies any psychosocial barriers or concerns at this time.    Expected Outcomes For Madison Clements to participate in PR free of any psychosocial barriers or concerns    Continue Psychosocial Services  No Follow up required             Psychosocial Re-Evaluation:  Psychosocial Re-Evaluation     Row Name 09/01/23 0955 09/29/23 1044 10/25/23 1044         Psychosocial Re-Evaluation   Current issues with None Identified None Identified None Identified     Comments Pt is scheduled to start the PR program on 09/05/23. No new barriers or concerns since orientation on 12/11. Madison Clements continues to deny any psychosocial barriers or concerns at this time. Madison Clements denies any psychosocial barriers or concerns at this time.     Expected Outcomes For Madison Clements to participate in PR free of any psychosocial barriers or concerns. For Madison Clements to participate in PR free of any psychosocial barriers or concerns. For Madison Clements to participate in PR free of any psychosocial barriers or concerns.     Interventions Encouraged to attend Pulmonary Rehabilitation for the exercise Encouraged to attend Pulmonary Rehabilitation for the exercise Encouraged to attend Pulmonary Rehabilitation for the exercise     Continue Psychosocial Services  No Follow up required No Follow up required No Follow up required              Psychosocial Discharge (Final Psychosocial Re-Evaluation):   Psychosocial Re-Evaluation - 10/25/23 1044       Psychosocial Re-Evaluation   Current issues with None Identified    Comments Madison Clements denies any psychosocial barriers or concerns at this time.    Expected Outcomes For Madison Clements to participate in PR free of any psychosocial barriers or concerns.    Interventions Encouraged to attend Pulmonary Rehabilitation for the exercise    Continue Psychosocial Services  No Follow up required             Education: Education Goals: Education classes will be provided on a weekly basis, covering required topics. Participant will state understanding/return demonstration of topics presented.  Learning Barriers/Preferences:  Learning Barriers/Preferences - 08/23/23 1037       Learning Barriers/Preferences   Learning Barriers None    Learning Preferences Written Material;Skilled Demonstration             Education Topics: Know Your  Numbers Group instruction that is supported by a PowerPoint presentation. Instructor discusses importance of knowing and understanding resting, exercise, and post-exercise oxygen saturation, heart rate, and blood pressure. Oxygen saturation, heart rate, blood pressure, rating of perceived exertion, and dyspnea are reviewed along with a normal range for these values.  Flowsheet Row PULMONARY REHAB CHRONIC OBSTRUCTIVE PULMONARY DISEASE from 09/14/2023 in [facility] for Heart, Vascular, & Lung Health  Date 09/14/23  Educator EP  Instruction Review Code 1- Verbalizes Understanding       Exercise for the Pulmonary Patient Group instruction that is supported by a PowerPoint presentation. Instructor discusses benefits of exercise, core components of exercise, frequency, duration, and intensity of an exercise routine, importance of utilizing pulse oximetry during exercise, safety while exercising, and options of places to exercise outside of rehab.  Flowsheet Row PULMONARY REHAB CHRONIC OBSTRUCTIVE  PULMONARY DISEASE from 09/07/2023 in [facility] for [facility], Vascular, & Lung Health  Date 09/07/23  Educator EP  Instruction Review Code 1- Verbalizes Understanding       MET Level  Group instruction provided by PowerPoint, verbal discussion, and written material to support subject matter. Instructor reviews what METs are and how to increase METs.    Pulmonary Medications Verbally interactive group education provided by instructor with focus on inhaled medications and proper administration.   Anatomy and Physiology of the Respiratory System Group instruction provided by PowerPoint, verbal discussion, and written material to support subject matter. Instructor reviews respiratory cycle and anatomical components of the respiratory system and their functions. Instructor also reviews differences in obstructive and restrictive respiratory diseases with examples of each.    Oxygen Safety Group instruction provided by PowerPoint, verbal discussion, and written material to support subject matter. There is an overview of "What is Oxygen" and "Why do we need it".  Instructor also reviews how to create a safe environment for oxygen use, the importance of using oxygen as prescribed, and the risks of noncompliance. There is a brief discussion on traveling with oxygen and resources the patient may utilize. Flowsheet Row PULMONARY REHAB OTHER RESPIRATORY from 09/21/2023 in [facility] for Heart, Vascular, & Lung Health  Date 09/21/23  Educator RN  Instruction Review Code 1- Verbalizes Understanding       Oxygen Use Group instruction provided by PowerPoint, verbal discussion, and written material to discuss how supplemental oxygen is prescribed and different types of oxygen supply systems. Resources for more information are provided.  Flowsheet Row PULMONARY REHAB OTHER RESPIRATORY from 09/28/2023 in [facility] for Heart, Vascular,  & Lung Health  Date 09/28/23  Educator RT  Instruction Review Code 1- Verbalizes Understanding       Breathing Techniques Group instruction that is supported by demonstration and informational handouts. Instructor discusses the benefits of pursed lip and diaphragmatic breathing and detailed demonstration on how to perform both.  Flowsheet Row PULMONARY REHAB OTHER RESPIRATORY from 10/05/2023 in [facility] for Heart, Vascular, & Lung Health  Date 10/05/23  Educator RN  Instruction Review Code 1- Verbalizes Understanding        Risk Factor Reduction Group instruction that is supported by a PowerPoint presentation. Instructor discusses the definition of a risk factor, different risk factors for pulmonary disease, and how the heart and lungs work together. Flowsheet Row PULMONARY REHAB OTHER RESPIRATORY from 10/26/2023 in [facility] for Heart, Vascular, & Lung Health  Date 10/26/23  Educator EP  Instruction Review Code 1- Verbalizes Understanding       Pulmonary Diseases Group instruction provided by PowerPoint, verbal discussion, and written material to support subject matter. Instructor gives an overview of the different type of pulmonary diseases. There is also a discussion on risk factors and symptoms as well as ways to manage the diseases.   Stress and Energy Conservation Group instruction provided by PowerPoint, verbal discussion, and written material to support subject matter. Instructor gives an overview of stress and the impact it can have on the body. Instructor also reviews ways to reduce stress. There is also a discussion on energy conservation and ways to conserve energy throughout the day. Flowsheet Row PULMONARY REHAB OTHER RESPIRATORY from 10/12/2023 in [facility] for Heart, Vascular, & Lung Health  Date 10/12/23  Educator RN  Instruction Review Code 1- Verbalizes Understanding       Warning  Signs and Symptoms Group instruction provided by PowerPoint, verbal discussion, and written material to support subject matter. Instructor reviews warning signs and symptoms of stroke, heart attack, cold and flu. Instructor also reviews ways to prevent the spread of infection.   Other Education Group or individual verbal, written, or video instructions that support the educational goals of the pulmonary rehab program.    Knowledge Questionnaire Score:  Knowledge Questionnaire Score - 08/23/23 1110       Knowledge Questionnaire Score   Pre Score 16/18             Core Components/Risk Factors/Patient Goals at Admission:  Personal Goals and Risk Factors at Admission - 08/23/23 1038       Core Components/Risk Factors/Patient Goals on Admission   Improve shortness of breath with ADL's Yes    Intervention Provide education, individualized exercise plan and daily activity instruction to help decrease symptoms of SOB with activities of daily living.    Expected Outcomes Short Term: Improve cardiorespiratory fitness to achieve a reduction of symptoms when performing ADLs;Long Term: Be able to perform more ADLs without symptoms or delay the onset of symptoms             Core Components/Risk Factors/Patient Goals Review:   Goals and Risk Factor Review     Row Name 09/01/23 0957 09/29/23 1044 10/25/23 1044         Core Components/Risk Factors/Patient Goals Review   Personal Goals Review Improve shortness of breath with ADL's;Develop more efficient breathing techniques such as purse lipped breathing and diaphragmatic breathing and practicing self-pacing with activity. Improve shortness of breath with ADL's;Develop more efficient breathing techniques such as purse lipped breathing and diaphragmatic breathing and practicing self-pacing with activity. Improve shortness of breath with ADL's;Develop more efficient breathing techniques such as purse lipped breathing and diaphragmatic  breathing and practicing self-pacing with activity.     Review Madison Clements is scheduled to start the PR program on 09/05/23. Goal progressing for improving shortness of breath with ADL's. Goal progressing for developing more efficient breathing techniques such as purse lipped breathing and diaphragmatic breathing; and practicing self-pacing with activity. Goal progressing on improving her shortness of breath with ADLs. Goal progressing on developing more efficient breathing techniques such as purse lipped breathing and diaphragmatic breathing; and practicing self-pacing with activity. Madison Clements has remained on RA to keep her sats >88% while exercising. We will continue to monitor Sylvia's progress throughout the program. Goal progressing on improving her shortness of breath with ADLs. Goal met on developing more efficient breathing techniques such as purse lipped breathing and diaphragmatic  breathing; and practicing self-pacing with activity. Madison Clements is able to demonstrate purse lip breathing when she gets short of breath. She also knows how to pace herself based on her rate of perceived exertion score. Madison Clements has remained on RA to keep her sats >88% while exercising. She is currently exercising on the recumbant elliptical and the Nustep. We will continue to monitor Sylvia's progress throughout the program.     Expected Outcomes To improve shortness of breath with ADL's and develop more efficient breathing techniques such as purse lipped breathing and diaphragmatic breathing; and practicing self-pacing with activity. To improve shortness of breath with ADL's and develop more efficient breathing techniques such as purse lipped breathing and diaphragmatic breathing; and practicing self-pacing with activity. To improve shortness of breath with ADL's              Core Components/Risk Factors/Patient Goals at Discharge (Final Review):   Goals and Risk Factor Review - 10/25/23 1044       Core Components/Risk  Factors/Patient Goals Review   Personal Goals Review Improve shortness of breath with ADL's;Develop more efficient breathing techniques such as purse lipped breathing and diaphragmatic breathing and practicing self-pacing with activity.    Review Goal progressing on improving her shortness of breath with ADLs. Goal met on developing more efficient breathing techniques such as purse lipped breathing and diaphragmatic breathing; and practicing self-pacing with activity. Madison Clements is able to demonstrate purse lip breathing when she gets short of breath. She also knows how to pace herself based on her rate of perceived exertion score. Madison Clements has remained on RA to keep her sats >88% while exercising. She is currently exercising on the recumbant elliptical and the Nustep. We will continue to monitor Sylvia's progress throughout the program.    Expected Outcomes To improve shortness of breath with ADL's             ITP Comments: Pt is making expected progress toward Pulmonary Rehab goals after completing 15 session(s). Recommend continued exercise, life style modification, education, and utilization of breathing techniques to increase stamina and strength, while also decreasing shortness of breath with exertion.  Dr. Mechele Collin is Medical Director for Pulmonary Rehab at [facility].

## 2023-11-02 ENCOUNTER — Encounter (HOSPITAL_COMMUNITY): Payer: Medicare Other

## 2023-11-07 ENCOUNTER — Encounter (HOSPITAL_COMMUNITY)
Admission: RE | Admit: 2023-11-07 | Discharge: 2023-11-07 | Disposition: A | Discharge status: Home / Self Care | Payer: Medicare Other | Source: Ambulatory Visit | Attending: Cardiology

## 2023-11-07 ENCOUNTER — Encounter (HOSPITAL_COMMUNITY): Payer: Medicare Other

## 2023-11-07 DIAGNOSIS — I272 Pulmonary hypertension, unspecified: Secondary | ICD-10-CM

## 2023-11-07 NOTE — Progress Notes (Signed)
 Daily Session Note  Patient Details  Name: Madison Clements MRN: [id number] Date of Birth: 1945-12-26 Referring Provider:   Doristine Devoid Pulmonary Rehab Walk Test from 08/23/2023 in [facility] for Heart, Vascular, & Lung Health  Referring Provider Turner       Encounter Date: 11/07/2023  Check In:  Session Check In - 11/07/23 1036       Check-In   Supervising physician immediately available to respond to emergencies [facility] MD immediately available    Physician(s) Robin Searing, NP    Location [facility]    Staff Present Essie Hart, RN, Doris Cheadle, MS, ACSM-CEP, Exercise Physiologist;Casey Katrinka Blazing, RT    Virtual Visit No    Medication changes reported     No    Fall or balance concerns reported    No    Tobacco Cessation No Change    Warm-up and Cool-down Performed as group-led instruction    Resistance Training Performed Yes    VAD Patient? No    PAD/SET Patient? No      Pain Assessment   Currently in Pain? No/denies    Multiple Pain Sites No             Capillary Blood Glucose: No results found for this or any previous visit (from the past 24 hours).    Social History   Tobacco Use  Smoking Status Former   Current packs/day: 0.00   Average packs/day: 0.3 packs/day for 10.0 years (2.5 ttl pk-yrs)   Types: Cigarettes   Start date: 09/12/1986   Quit date: 09/12/1996   Years since quitting: 27.1  Smokeless Tobacco Never    Goals Met:  Proper associated with RPD/PD & O2 Sat Independence with exercise equipment Exercise tolerated well No report of concerns or symptoms today Strength training completed today  Goals Unmet:  Not Applicable  Comments: Service time is from 1022 to 1150.    Dr. Mechele Collin is Medical Director for Pulmonary Rehab at [facility].

## 2023-11-08 ENCOUNTER — Ambulatory Visit (AMBULATORY_SURGERY_CENTER): Payer: Medicare Other | Admitting: Internal Medicine

## 2023-11-08 ENCOUNTER — Encounter: Payer: Self-pay | Admitting: Internal Medicine

## 2023-11-08 VITALS — BP 93/48 | HR 65 | Temp 98.6°F | Resp 17 | Ht 63.0 in | Wt 148.0 lb

## 2023-11-08 DIAGNOSIS — K449 Diaphragmatic hernia without obstruction or gangrene: Secondary | ICD-10-CM | POA: Diagnosis not present

## 2023-11-08 DIAGNOSIS — K317 Polyp of stomach and duodenum: Secondary | ICD-10-CM

## 2023-11-08 DIAGNOSIS — R1310 Dysphagia, unspecified: Secondary | ICD-10-CM | POA: Diagnosis not present

## 2023-11-08 DIAGNOSIS — K222 Esophageal obstruction: Secondary | ICD-10-CM

## 2023-11-08 DIAGNOSIS — K219 Gastro-esophageal reflux disease without esophagitis: Secondary | ICD-10-CM | POA: Diagnosis not present

## 2023-11-08 DIAGNOSIS — G4730 Sleep apnea, unspecified: Secondary | ICD-10-CM | POA: Diagnosis not present

## 2023-11-08 DIAGNOSIS — I10 Essential (primary) hypertension: Secondary | ICD-10-CM | POA: Diagnosis not present

## 2023-11-08 DIAGNOSIS — Z885 Allergy status to narcotic agent status: Secondary | ICD-10-CM | POA: Diagnosis not present

## 2023-11-08 MED ORDER — SODIUM CHLORIDE 0.9 % IV SOLN: 500.0000 mL | INTRAVENOUS | Status: DC

## 2023-11-08 NOTE — Progress Notes (Signed)
 HISTORY OF PRESENT ILLNESS:  Madison Clements is a 78 y.o. female with a history of GERD complicated by esophageal stricture for which she underwent dilation in 2021.  Called the office recently with transient food impaction.  Now for EGD with dilation  REVIEW OF SYSTEMS:  All non-GI ROS negative except for  Past Medical History:  Diagnosis Date   Allergy 1990   Morphine following surgery   Arthritis    generalized   Atypical mole 02/24/2015   RGHT LATERAL THIGH MODERATE   Atypical mole 01/20/2022   Left Breast (mild)   Atypical mole 01/20/2022   Mid Back (mild)   Atypical nevi 02/24/2015   RIGHT NECK MILD   Atypical nevi 08/25/2015   LEFT UPPER ARM MILD   Atypical nevi 08/25/2015   LEFT LATERAL FOREARM MILD/FREE   Atypical nevi 02/23/2016   LEFT THIGH MODERATE/FREE   Atypical nevi 02/23/2016   LEFT MEDIAL LEG MODERATE/FREE   Atypical nevi 02/23/2016   LEFT UPPER BACK MILD /FREE   Atypical nevi 08/23/2016   RIGHT ANT PROX THIGH MILD /FREE   Atypical nevi 07/25/2018   MID LOWER BACK MODERATE   Atypical nevi 01/22/2019   LEFT MID BACK MILD /FREE   Atypical nevi 01/22/2019   LEFT NECK MILD   Atypical nevi 08/06/2019   LEFT INNER BREAST MODERATE W/S   Atypical nevi 08/06/2019   LEFT OUTER SIDE MILD/FREE   Blood transfusion without reported diagnosis    hx of   Breast cancer (HCC) 2022   CAD (coronary artery disease), native coronary artery 02/07/2019   Minimal CAD at the time of cath in 2016 in [facility] [place]   Family history of breast cancer 08/19/2021   Family history of pancreatic cancer 08/19/2021   Fatty liver 05/21/2020   Gastric polyps 10/02/2018   EGD 03/2015; benign   GERD (gastroesophageal reflux disease) 10/02/2018   on meds   History of melanoma 10/02/2018   Abdomen 2012; 2013; s/p local excisions.    Hyperlipidemia    on meds   Hypertension    on meds   Melanoma (HCC) 2012   MM- central lower abdomen- (ECX) [facility]    Osteopenia    Personal history of radiation therapy    Polyp of colon, adenomatous 10/02/2018   Colonoscopy 03/2015; recheck in 2021   PONV (postoperative nausea and vomiting)    Pulmonary hypertension, primary (HCC) 10/02/2018   SCCA (squamous cell carcinoma) of skin 09/18/2017   RIGHT ANT. DISTAL LOWER LEG TREATED BY DR. Leda Quail   Sleep apnea    Sleep apnea 05/26/2020   uses CPAP   Tricuspid regurgitation    moderate by echo 04/2023    Past Surgical History:  Procedure Laterality Date   ABDOMINAL HYSTERECTOMY  1990   APPENDECTOMY  1962   BLADDER SUSPENSION  1990   BREAST LUMPECTOMY WITH RADIOACTIVE SEED LOCALIZATION Left 09/09/2021   Procedure: LEFT BREAST LUMPECTOMY WITH RADIOACTIVE SEED LOCALIZATION X2;  Surgeon: Manus Rudd, MD;  Location: [facility];  Service: General;  Laterality: Left;   COLONOSCOPY  2016   in [place]-hx of polyps   EYE SURGERY  2013 torn retina laser   MENISCUS REPAIR Right 2001   right knee   RIGHT HEART CATH AND CORONARY ANGIOGRAPHY N/A 06/20/2023   Procedure: RIGHT HEART CATH AND CORONARY ANGIOGRAPHY;  Surgeon: Laurey Morale, MD;  Location: [facility] INVASIVE CV LAB;  Service: Cardiovascular;  Laterality: N/A;   thumb surgery Left  TONSILLECTOMY AND ADENOIDECTOMY     tuabl ligation     TUBAL LIGATION  1975   WISDOM TOOTH EXTRACTION      Social History Madison Clements  reports that she quit smoking about 27 years ago. Her smoking use included cigarettes. She started smoking about 37 years ago. She has a 2.5 pack-year smoking history. She has never used smokeless tobacco. She reports that she does not currently use alcohol. She reports that she does not use drugs.  family history includes Arthritis in her mother, paternal grandmother, and sister; Asthma in her maternal grandfather and paternal grandfather; Breast cancer (age of onset: 44) in her sister; COPD in her paternal grandfather; Diabetes in her sister; Early death in her  maternal grandmother; Heart attack in her father; Heart disease in her father, maternal uncle, maternal uncle, and paternal grandmother; High Cholesterol in her sister; High blood pressure in her father, maternal grandmother, mother, paternal grandmother, and sister; Hyperlipidemia in her mother; Hypertension in her father, maternal grandmother, mother, and paternal grandmother; Kidney disease in her maternal grandfather and paternal grandfather; Pancreatic cancer in her maternal aunt; Prostate cancer in her cousin; Stroke in her maternal grandmother, mother, and paternal aunt; Varicose Veins in her mother and sister.  Allergies  Allergen Reactions   Morphine Rash and Nausea And Vomiting   Metoprolol Other (See Comments)   Nebivolol Other (See Comments)    bradycardia   Morphine And Codeine Hives, Dermatitis and Rash       PHYSICAL EXAMINATION: Vital signs: BP 131/61   Pulse 70   Temp 98.6 F (37 C)   Resp 12   Ht 5\' 3"  (1.6 m)   Wt 148 lb (67.1 kg)   SpO2 95%   BMI 26.22 kg/m  General: Well-developed, well-nourished, no acute distress HEENT: Sclerae are anicteric, conjunctiva pink. Oral mucosa intact Lungs: Clear Heart: Regular Abdomen: soft, nontender, nondistended, no obvious ascites, no peritoneal signs, normal bowel sounds. No organomegaly. Extremities: No edema Psychiatric: alert and oriented x3. Cooperative     ASSESSMENT:  GERD complicated by peptic stricture.  Now with recurrent dysphagia   PLAN: EGD with dilation.

## 2023-11-08 NOTE — Progress Notes (Signed)
Pt states no changes to health hx since previsit

## 2023-11-08 NOTE — Progress Notes (Signed)
 Vss nad trans to pacu

## 2023-11-08 NOTE — Progress Notes (Signed)
 Called to room to assist during endoscopic procedure.  Patient ID and intended procedure confirmed with present staff. Received instructions for my participation in the procedure from the performing physician.

## 2023-11-08 NOTE — Op Note (Signed)
 [facility] Patient Name: Madison Clements Procedure Date: 11/08/2023 11:00 AM MRN: [id number] Endoscopist: Wilhemina Bonito. Marina Goodell , MD, [id number] Age: 78 Referring MD:  Date of Birth: 06/08/1946 Gender: Female Account #: [id number] Procedure:                Upper GI endoscopy Indications:              Dysphagia, Therapeutic procedure, Esophageal reflux Medicines:                Monitored Anesthesia Care Procedure:                Pre-Anesthesia Assessment:                           - Prior to the procedure, a History and Physical                            was performed, and patient medications and                            allergies were reviewed. The patient's tolerance of                            previous anesthesia was also reviewed. The risks                            and benefits of the procedure and the sedation                            options and risks were discussed with the patient.                            All questions were answered, and informed consent                            was obtained. Prior Anticoagulants: The patient has                            taken no anticoagulant or antiplatelet agents.                            After reviewing the risks and benefits, the patient                            was deemed in satisfactory condition to undergo the                            procedure.                           After obtaining informed consent, the endoscope was                            passed under direct vision. Throughout the  procedure, the patient's blood pressure, pulse, and                            oxygen saturations were monitored continuously. The                            GIF [id number] #[id number] was introduced through the                            mouth, and advanced to the second part of duodenum.                            The upper GI endoscopy was accomplished without                            difficulty. The  patient tolerated the procedure                            well. Scope In: Scope Out: Findings:                 One benign-appearing, intrinsic moderate stenosis                            was found 31 cm from the incisors. This stenosis                            measured 1.5 cm (inner diameter). The scope was                            withdrawn. Dilation was performed with a Maloney                            dilator with no resistance at 54 Fr.                           The exam of the esophagus was otherwise normal.                           The stomach revealed multiple benign fundic gland                            type polyps and a sliding hiatal hernia.                           The examined duodenum was normal.                           The cardia and gastric fundus were normal on                            retroflexion. Complications:            No immediate complications. Estimated Blood Loss:     Estimated blood loss: none. Impression:               -  Benign-appearing esophageal stenosis. Dilated.                           - Normal stomach say benign fundic gland polyps.                            Hiatal hernia.                           - Normal examined duodenum.                           - No specimens collected. Recommendation:           - Patient has a contact number available for                            emergencies. The signs and symptoms of potential                            delayed complications were discussed with the                            patient. Return to normal activities tomorrow.                            Written discharge instructions were provided to the                            patient.                           -Post dilation diet.                           - Continue present medications.                           -Office follow-up with Dr. Marina Goodell in 6 to 8 weeks Wilhemina Bonito. Marina Goodell, MD 11/08/2023 11:24:26 AM This report has been signed  electronically.

## 2023-11-08 NOTE — Patient Instructions (Addendum)
 Follow post dilation diet - resume medications.  Follow up with Dr. Marina Goodell scheduled - if you need to reschedule, please contact the office directly.    YOU HAD AN ENDOSCOPIC PROCEDURE TODAY AT THE [facility]:   Refer to the procedure report that was given to you for any specific questions about what was found during the examination.  If the procedure report does not answer your questions, please call your gastroenterologist to clarify.  If you requested that your care partner not be given the details of your procedure findings, then the procedure report has been included in a sealed envelope for you to review at your convenience later.  YOU SHOULD EXPECT: Some feelings of bloating in the abdomen. Passage of more gas than usual.  Walking can help get rid of the air that was put into your GI tract during the procedure and reduce the bloating. If you had a lower endoscopy (such as a colonoscopy or flexible sigmoidoscopy) you may notice spotting of blood in your stool or on the toilet paper. If you underwent a bowel prep for your procedure, you may not have a normal bowel movement for a few days.  Please Note:  You might notice some irritation and congestion in your nose or some drainage.  This is from the oxygen used during your procedure.  There is no need for concern and it should clear up in a day or so.  SYMPTOMS TO REPORT IMMEDIATELY:  Following upper endoscopy (EGD)  Vomiting of blood or coffee ground material  New chest pain or pain under the shoulder blades  Painful or persistently difficult swallowing  New shortness of breath  Fever of 100F or higher  Black, tarry-looking stools  For urgent or emergent issues, a gastroenterologist can be reached at any hour by calling [phone]. Do not use MyChart messaging for urgent concerns.    DIET:  Follow post dilation diet handout.  ACTIVITY:  You should plan to take it easy for the rest of today and you should NOT DRIVE or  use heavy machinery until tomorrow (because of the sedation medicines used during the test).    FOLLOW UP: Our staff will call the number listed on your records the next business day following your procedure.  We will call around 7:15- 8:00 am to check on you and address any questions or concerns that you may have regarding the information given to you following your procedure. If we do not reach you, we will leave a message.     If any biopsies were taken you will be contacted by phone or by letter within the next 1-3 weeks.  Please call us at [phone] if you have not heard about the biopsies in 3 weeks.    SIGNATURES/CONFIDENTIALITY: You and/or your care partner have signed paperwork which will be entered into your electronic medical record.  These signatures attest to the fact that that the information above on your After Visit Summary has been reviewed and is understood.  Full responsibility of the confidentiality of this discharge information lies with you and/or your care-partner.

## 2023-11-09 ENCOUNTER — Encounter (HOSPITAL_COMMUNITY): Payer: Medicare Other

## 2023-11-09 ENCOUNTER — Encounter (HOSPITAL_COMMUNITY)
Admission: RE | Admit: 2023-11-09 | Discharge: 2023-11-09 | Disposition: A | Discharge status: Home / Self Care | Payer: Medicare Other | Source: Ambulatory Visit | Attending: Cardiology | Admitting: Cardiology

## 2023-11-09 ENCOUNTER — Telehealth: Payer: Self-pay

## 2023-11-09 DIAGNOSIS — I272 Pulmonary hypertension, unspecified: Secondary | ICD-10-CM

## 2023-11-09 NOTE — Progress Notes (Signed)
 Daily Session Note  Patient Details  Name: Madison Clements MRN: [id number] Date of Birth: Dec 29, 1945 Referring Provider:   Doristine Devoid Pulmonary Rehab Walk Test from 08/23/2023 in [facility]  Referring Provider Turner       Encounter Date: 11/09/2023  Check In:  Session Check In - 11/09/23 1029       Check-In   Supervising physician immediately available to respond to emergencies [facility] MD immediately available    Physician(s) Bernadene Person, NP    Location [facility]    Staff Present Essie Hart, RN, Doris Cheadle, MS, ACSM-CEP, Exercise Physiologist;Casey Erin Sons BS, ACSM-CEP, Exercise Physiologist;Samantha Belarus, RD, LDN    Virtual Visit No    Medication changes reported     No    Fall or balance concerns reported    No    Tobacco Cessation No Change    Warm-up and Cool-down Performed as group-led instruction    Resistance Training Performed Yes    VAD Patient? No    PAD/SET Patient? No      Pain Assessment   Currently in Pain? No/denies             Capillary Blood Glucose: No results found for this or any previous visit (from the past 24 hours).    Social History   Tobacco Use  Smoking Status Former   Current packs/day: 0.00   Average packs/day: 0.3 packs/day for 10.0 years (2.5 ttl pk-yrs)   Types: Cigarettes   Start date: 09/12/1986   Quit date: 09/12/1996   Years since quitting: 27.1  Smokeless Tobacco Never    Goals Met:  Independence with exercise equipment Exercise tolerated well No report of concerns or symptoms today Strength training completed today  Goals Unmet:  Not Applicable  Comments: Service time is from 1010 to 1138.    Dr. Mechele Collin is Medical Director for Pulmonary Rehab at [facility]

## 2023-11-09 NOTE — Telephone Encounter (Signed)
  Follow up Call-     11/08/2023   10:11 AM  Call back number  Post procedure Call Back phone  # [phone]  Permission to leave phone message Yes     Patient questions:  Do you have a fever, pain , or abdominal swelling? No. Pain Score  0 *  Have you tolerated food without any problems? Yes.    Have you been able to return to your normal activities? Yes.    Do you have any questions about your discharge instructions: Diet   No. Medications  No. Follow up visit  No.  Do you have questions or concerns about your Care? No.  Actions: * If pain score is 4 or above: No action needed, pain <4.

## 2023-11-13 ENCOUNTER — Ambulatory Visit (HOSPITAL_COMMUNITY): Payer: Medicare Other | Attending: Cardiology

## 2023-11-13 DIAGNOSIS — I071 Rheumatic tricuspid insufficiency: Secondary | ICD-10-CM | POA: Diagnosis not present

## 2023-11-13 LAB — ECHOCARDIOGRAM COMPLETE
AR max vel: 2.01 cm2
AV Area VTI: 2.06 cm2
AV Area mean vel: 2.06 cm2
AV Mean grad: 4.5 mmHg
AV Peak grad: 8.1 mmHg
Ao pk vel: 1.43 m/s
Area-P 1/2: 3.5 cm2
S' Lateral: 1.6 cm

## 2023-11-14 ENCOUNTER — Encounter (HOSPITAL_COMMUNITY)
Admission: RE | Admit: 2023-11-14 | Discharge: 2023-11-14 | Disposition: A | Discharge status: Home / Self Care | Payer: Medicare Other | Source: Ambulatory Visit | Attending: Cardiology | Admitting: Cardiology

## 2023-11-14 ENCOUNTER — Encounter (HOSPITAL_COMMUNITY): Payer: Medicare Other

## 2023-11-14 VITALS — Wt 144.8 lb

## 2023-11-14 DIAGNOSIS — I272 Pulmonary hypertension, unspecified: Secondary | ICD-10-CM | POA: Diagnosis not present

## 2023-11-14 NOTE — Progress Notes (Signed)
 Daily Session Note  Patient Details  Name: Madison Clements MRN: [id number] Date of Birth: [birth year] Referring Provider:   Doristine Devoid Pulmonary Rehab Walk Test from 08/23/2023 in [facility]  Referring Provider Turner       Encounter Date: 11/14/2023  Check In:  Session Check In - 11/14/23 1023       Check-In   Supervising physician immediately available to respond to emergencies [facility] MD immediately available    Physician(s) Bernadene Person, NP    [facility]    Staff Present Essie Hart, RN, Doris Cheadle, MS, ACSM-CEP, Exercise Physiologist;Casey Erin Sons BS, ACSM-CEP, Exercise Physiologist;Samantha Belarus, RD, LDN    Virtual Visit No    Medication changes reported     No    Fall or balance concerns reported    No    Tobacco Cessation No Change    Warm-up and Cool-down Performed as group-led instruction    Resistance Training Performed Yes    VAD Patient? No    PAD/SET Patient? No      Pain Assessment   Currently in Pain? No/denies    Multiple Pain Sites No             Capillary Blood Glucose: No results found for this or any previous visit (from the past 24 hours).   Exercise Prescription Changes - 11/14/23 1100       Response to Exercise   Blood Pressure (Admit) 102/58    Blood Pressure (Exercise) 142/62    Blood Pressure (Exit) 96/56    Heart Rate (Admit) 70 bpm    Heart Rate (Exercise) 98 bpm    Heart Rate (Exit) 78 bpm    Oxygen Saturation (Admit) 97 %    Oxygen Saturation (Exercise) 94 %    Oxygen Saturation (Exit) 96 %    Rating of Perceived Exertion (Exercise) 14    Perceived Dyspnea (Exercise) 2    Duration Continue with 30 min of aerobic exercise without signs/symptoms of physical distress.    Intensity THRR unchanged      Progression   Progression Continue to progress workloads to maintain intensity without signs/symptoms of physical  distress.      Resistance Training   Training Prescription Yes    Weight red bands    Reps 10-15    Time 10 Minutes      NuStep   Level 4    SPM 79    Minutes 15    METs 2.8      Recumbant Elliptical   Level 3    Minutes 15    METs 5.4             Social History   Tobacco Use  Smoking Status Former   Current packs/day: 0.00   Average packs/day: 0.3 packs/day for 10.0 years (2.5 ttl pk-yrs)   Types: Cigarettes   Start date: 09/12/1986   Quit date: 09/12/1996   Years since quitting: 27.1  Smokeless Tobacco Never    Goals Met:  Proper associated with RPD/PD & O2 Sat Independence with exercise equipment Exercise tolerated well No report of concerns or symptoms today Strength training completed today  Goals Unmet:  Not Applicable  Comments: Service time is from 1010 to 1132.    Dr. Mechele Collin is Medical Director for Pulmonary Rehab at [facility].

## 2023-11-16 ENCOUNTER — Encounter (HOSPITAL_COMMUNITY)
Admission: RE | Admit: 2023-11-16 | Discharge: 2023-11-16 | Disposition: A | Discharge status: Home / Self Care | Payer: Medicare Other | Source: Ambulatory Visit | Attending: Cardiology | Admitting: Cardiology

## 2023-11-16 ENCOUNTER — Encounter (HOSPITAL_COMMUNITY): Payer: Medicare Other

## 2023-11-16 DIAGNOSIS — I272 Pulmonary hypertension, unspecified: Secondary | ICD-10-CM | POA: Diagnosis not present

## 2023-11-16 NOTE — Progress Notes (Signed)
 Daily Session Note  Patient Details  Name: Madison Clements MRN: [id number] Date of Birth: [birth year] Referring Provider:   Doristine Devoid Pulmonary Rehab Walk Test from 08/23/2023 in [facility]  Referring Provider Turner       Encounter Date: 11/16/2023  Check In:  Session Check In - 11/16/23 1022       Check-In   Supervising physician immediately available to respond to emergencies [facility] MD immediately available    Physician(s) Robin Searing, nP    [facility]    Staff Present Essie Hart, RN, Doris Cheadle, MS, ACSM-CEP, Exercise Physiologist;Kodie Pick Erin Sons BS, ACSM-CEP, Exercise Physiologist    Virtual Visit No    Medication changes reported     No    Tobacco Cessation No Change    Warm-up and Cool-down Performed as group-led instruction    Resistance Training Performed Yes    VAD Patient? No    PAD/SET Patient? No      Pain Assessment   Currently in Pain? No/denies             Capillary Blood Glucose: No results found for this or any previous visit (from the past 24 hours).    Social History   Tobacco Use  Smoking Status Former   Current packs/day: 0.00   Average packs/day: 0.3 packs/day for 10.0 years (2.5 ttl pk-yrs)   Types: Cigarettes   Start date: 09/12/1986   Quit date: 09/12/1996   Years since quitting: 27.1  Smokeless Tobacco Never    Goals Met:  Proper associated with RPD/PD & O2 Sat Independence with exercise equipment Exercise tolerated well No report of concerns or symptoms today Strength training completed today  Goals Unmet:  Not Applicable  Comments: Service time is from 1008 to 1134.    Dr. Mechele Collin is Medical Director for Pulmonary Rehab at [facility].

## 2023-11-20 ENCOUNTER — Inpatient Hospital Stay: Payer: Medicare Other | Attending: Hematology

## 2023-11-20 ENCOUNTER — Inpatient Hospital Stay (HOSPITAL_BASED_OUTPATIENT_CLINIC_OR_DEPARTMENT_OTHER): Payer: Medicare Other | Admitting: Hematology

## 2023-11-20 ENCOUNTER — Ambulatory Visit: Payer: Medicare Other | Admitting: Gastroenterology

## 2023-11-20 ENCOUNTER — Other Ambulatory Visit: Payer: Self-pay

## 2023-11-20 ENCOUNTER — Other Ambulatory Visit: Payer: Self-pay | Admitting: Hematology

## 2023-11-20 ENCOUNTER — Inpatient Hospital Stay: Payer: Medicare Other

## 2023-11-20 VITALS — BP 136/52 | HR 85 | Temp 97.8°F | Resp 20 | Ht 63.0 in | Wt 147.8 lb

## 2023-11-20 DIAGNOSIS — Z853 Personal history of malignant neoplasm of breast: Secondary | ICD-10-CM | POA: Diagnosis not present

## 2023-11-20 DIAGNOSIS — M8580 Other specified disorders of bone density and structure, unspecified site: Secondary | ICD-10-CM

## 2023-11-20 DIAGNOSIS — M810 Age-related osteoporosis without current pathological fracture: Secondary | ICD-10-CM | POA: Diagnosis not present

## 2023-11-20 DIAGNOSIS — L989 Disorder of the skin and subcutaneous tissue, unspecified: Secondary | ICD-10-CM

## 2023-11-20 DIAGNOSIS — D519 Vitamin B12 deficiency anemia, unspecified: Secondary | ICD-10-CM | POA: Diagnosis not present

## 2023-11-20 DIAGNOSIS — Z1231 Encounter for screening mammogram for malignant neoplasm of breast: Secondary | ICD-10-CM

## 2023-11-20 DIAGNOSIS — C50812 Malignant neoplasm of overlapping sites of left female breast: Secondary | ICD-10-CM

## 2023-11-20 DIAGNOSIS — Z170 Estrogen receptor positive status [ER+]: Secondary | ICD-10-CM | POA: Diagnosis not present

## 2023-11-20 DIAGNOSIS — N6320 Unspecified lump in the left breast, unspecified quadrant: Secondary | ICD-10-CM

## 2023-11-20 LAB — CBC WITH DIFFERENTIAL/PLATELET
Abs Immature Granulocytes: 0.08 10*3/uL — ABNORMAL HIGH (ref 0.00–0.07)
Basophils Absolute: 0 10*3/uL (ref 0.0–0.1)
Basophils Relative: 1 %
Eosinophils Absolute: 0 10*3/uL (ref 0.0–0.5)
Eosinophils Relative: 1 %
HCT: 27.9 % — ABNORMAL LOW (ref 36.0–46.0)
Hemoglobin: 9.1 g/dL — ABNORMAL LOW (ref 12.0–15.0)
Immature Granulocytes: 2 %
Lymphocytes Relative: 24 %
Lymphs Abs: 1.1 10*3/uL (ref 0.7–4.0)
MCH: 31.1 pg (ref 26.0–34.0)
MCHC: 32.6 g/dL (ref 30.0–36.0)
MCV: 95.2 fL (ref 80.0–100.0)
Monocytes Absolute: 0.3 10*3/uL (ref 0.1–1.0)
Monocytes Relative: 7 %
Neutro Abs: 3 10*3/uL (ref 1.7–7.7)
Neutrophils Relative %: 65 %
Platelets: 193 10*3/uL (ref 150–400)
RBC: 2.93 MIL/uL — ABNORMAL LOW (ref 3.87–5.11)
RDW: 17.4 % — ABNORMAL HIGH (ref 11.5–15.5)
WBC: 4.4 10*3/uL (ref 4.0–10.5)
nRBC: 3.8 % — ABNORMAL HIGH (ref 0.0–0.2)

## 2023-11-20 LAB — VITAMIN B12: Vitamin B-12: 445 pg/mL (ref 180–914)

## 2023-11-20 LAB — CMP (CANCER CENTER ONLY)
ALT: 20 U/L (ref 0–44)
AST: 39 U/L (ref 15–41)
Albumin: 4.2 g/dL (ref 3.5–5.0)
Alkaline Phosphatase: 96 U/L (ref 38–126)
Anion gap: 11 (ref 5–15)
BUN: 24 mg/dL — ABNORMAL HIGH (ref 8–23)
CO2: 25 mmol/L (ref 22–32)
Calcium: 9.5 mg/dL (ref 8.9–10.3)
Chloride: 101 mmol/L (ref 98–111)
Creatinine: 1.24 mg/dL — ABNORMAL HIGH (ref 0.44–1.00)
GFR, Estimated: 44 mL/min — ABNORMAL LOW (ref >60)
Glucose, Bld: 124 mg/dL — ABNORMAL HIGH (ref 70–99)
Potassium: 3.5 mmol/L (ref 3.5–5.1)
Sodium: 137 mmol/L (ref 135–145)
Total Bilirubin: 0.5 mg/dL (ref 0.0–1.2)
Total Protein: 7.3 g/dL (ref 6.5–8.1)

## 2023-11-20 MED ORDER — CYANOCOBALAMIN 1000 MCG/ML IJ SOLN
1000.0000 ug | Freq: Once | INTRAMUSCULAR | Status: AC
  Administered 2023-11-20: 1000 ug via INTRAMUSCULAR
  Filled 2023-11-20: qty 1

## 2023-11-20 MED ORDER — SODIUM CHLORIDE 0.9 % IV SOLN: Freq: Once | INTRAVENOUS | Status: AC

## 2023-11-20 MED ORDER — ZOLEDRONIC ACID 4 MG/100ML IV SOLN
4.0000 mg | Freq: Once | INTRAVENOUS | Status: AC
Start: 2023-11-20 — End: 2023-11-20
  Administered 2023-11-20: 4 mg via INTRAVENOUS
  Filled 2023-11-20: qty 100

## 2023-11-20 NOTE — Patient Instructions (Signed)

## 2023-11-20 NOTE — Progress Notes (Signed)
 [facility]   [phone] [phone]   Clinic Follow up Note   Patient Care Team: Willow Ora, MD as PCP - General (Family Medicine) Quintella Reichert, MD as PCP - Cardiology (Cardiology) Quintella Reichert, MD as Consulting Physician (Cardiology) Sherrie George, MD as Consulting Physician (Ophthalmology) Glyn Ade, PA-C as Physician Assistant (Dermatology) Manus Rudd, MD as Consulting Physician (General Surgery) Malachy Mood, MD as Consulting Physician (Hematology) Lonie Peak, MD as Attending Physician (Radiation Oncology) Erroll Luna, [facility] (Inactive) (Pharmacist)  Date of Service:  11/20/2023  CHIEF COMPLAINT: f/u of breast cancer and anemia  CURRENT THERAPY:  Zometa every 6 months B12 injection monthly  Oncology History   Malignant neoplasm of overlapping sites of left breast in female, estrogen receptor positive (HCC) Stage IA, pT2, cN0, ER+/PR+/HER2-, Grade 2, RS 21 -she was under short-term f/u for left breast asymmetry. Biopsy 08/11/21 confirmed invasive mammary carcinoma. -left lumpectomy on 09/09/21 by Dr. 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 No nodes were removed due to her age. -she received adjuvant RT, completed in 11/2021 -she started adjuvant tamoxifen in 12/2021, but could not tolerate. She subsequently tried anastrozole and still can not tolerate. She stopped in 04/2022 -Screening mammogram in May 2024 were negative. -continue breast cancer surveillance    Osteoporosis of forearm -Her previous DEXA on 11/15/19 showed osteopenia (T-score -1.5). -Dexa 05/2022 showed T score -3.7 at forearm, -2.1 at hip; bisphosphonate was discussed with her last time. -she started Zometa in January 2024, plan to continue every 6 months for a total of 2 years.  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 B12 deficiency noted, with missed injections for the past two months. Anemia likely multifactorial, including B12 deficiency and possible anemia of chronic disease due to heart condition. Emphasized the need for regular B12 injections to maintain hemoglobin levels and prevent further drops. Weekly injections may be necessary if B12 levels remain low. - Administer B12 injection today - Schedule monthly B12 injections - Check B12 levels and hemoglobin in three months - Consider weekly B12 injections if levels remain low  Breast cancer follow-up Breast cancer with recent sensitivity and discomfort in the scar area and new soreness in the lateral breast. Due for a mammogram in May, prefers diagnostic mammogram, but insurance may not cover it. Discussed differences between screening and diagnostic mammograms and potential need for more frequent monitoring post-surgery. - Order screening mammogram for May - Evaluate scar area and lateral breast discomfort; no immediate concerns noted - Refer to [facility] for evaluation of scalp bumps  General Health Maintenance Due for a mammogram in May, no colonoscopy to date. Undergoing pulmonary rehab with a history of heart valve issues. Discussed importance of continuing pulmonary rehab and addressing heart valve issues with cardiologist. - Order screening mammogram for May - Refer to [facility] for evaluation of scalp bumps - Continue pulmonary rehab - Discuss heart valve issues with cardiologist on March 31  Follow-up - Schedule monthly B12 injections - Check B12 levels and hemoglobin in three months - Follow up in six months or sooner if blood counts drop - Schedule Zometa  infusion today and another  in six months.    -Dermatology referral for her skin lesions on the scalp     SUMMARY OF ONCOLOGIC HISTORY: Oncology History Overview Note   Cancer Staging  Malignant neoplasm of overlapping sites of left breast in female, estrogen receptor positive (HCC) Staging form: Breast, AJCC 8th Edition - Clinical stage from 08/11/2021: Stage IA (cT1c, cN0, cM0, G2, ER+, PR+, HER2-) - Signed by Malachy Mood, MD on 08/18/2021     Malignant neoplasm of overlapping sites of left breast in female, estrogen receptor positive (HCC)  07/27/2021 Mammogram   EXAM: DIGITAL DIAGNOSTIC UNILATERAL LEFT MAMMOGRAM WITH TOMOSYNTHESIS AND CAD; ULTRASOUND LEFT BREAST LIMITED  IMPRESSION: 1. There is a suspicious mass in the left breast at 9 o'clock measuring 1.8 cm.   2. There is a 1.0 cm group of suspicious linear calcifications anterior to the suspicious mass in the left breast, as well as a faint 2 mm group which lie 1 cm lateral to the linear calcifications.   3.  No evidence of left axillary lymphadenopathy   08/11/2021 Cancer Staging   Staging form: Breast, AJCC 8th Edition - Clinical stage from 08/11/2021: Stage IA (cT1c, cN0, cM0, G2, ER+, PR+, HER2-) - Signed by Malachy Mood, MD on 08/18/2021 Stage prefix: Initial diagnosis Histologic grading system: 3 grade system   08/11/2021 Initial Biopsy   Diagnosis 1. Breast, left, needle core biopsy, 9 o'clock, ribbon clip - INVASIVE MAMMARY CARCINOMA - SEE COMMENT 2. Breast, left, needle core biopsy, lower inner quadrant, x clip - INVASIVE MAMMARY CARCINOMA - MAMMARY CARCINOMA IN-SITU - CALCIFICATIONS - SEE COMMENT Microscopic Comment 1. The biopsy material shows an infiltrative proliferation of cells with arranged linearly and in small clusters. Based on the biopsy, the carcinoma appears Nottingham grade 2 of 3 and measures 1.2 cm in greatest linear extent. 2. Based on the biopsy, the carcinoma appears Nottingham grade 2 of 3 and measures 0.2 cm in  greatest linear extent.  1. PROGNOSTIC INDICATORS Results: The tumor cells are NEGATIVE for Her2 (1+). Estrogen Receptor: 100%, POSITIVE, STRONG STAINING INTENSITY Progesterone Receptor: 60%, POSITIVE, STRONG STAINING INTENSITY Proliferation Marker Ki67: 10%  2. PROGNOSTIC INDICATORS Results: The tumor cells are NEGATIVE for Her2 (1+). Estrogen Receptor: 100%, POSITIVE, STRONG STAINING INTENSITY Progesterone Receptor: 60%, POSITIVE, STRONG STAINING INTENSITY Proliferation Marker Ki67: 15%   08/13/2021 Mammogram   EXAM: DIGITAL DIAGNOSTIC UNILATERAL RIGHT MAMMOGRAM WITH TOMOSYNTHESIS AND CAD  IMPRESSION: No mammographic evidence for malignancy.   08/16/2021 Initial Diagnosis   Malignant neoplasm of overlapping sites of left breast in female, estrogen receptor positive (HCC)   08/25/2021 Imaging   EXAM: BILATERAL BREAST MRI WITH AND WITHOUT CONTRAST  IMPRESSION: 1. Biopsy-proven invasive ductal carcinoma measuring approximately 2.8 x 1.7 x 1.5 cm in the inner breast at middle depth, associated with the ribbon shaped tissue marking clip placed at the time of core needle biopsy. Enhancement extends approximately 1.4 cm posterior to the clip. 2. Approximate 1.8 cm post biopsy hematoma with associated rim enhancement at the site of the biopsy-proven invasive ductal carcinoma and DCIS in the LOWER INNER QUADRANT at anterior depth associated with the X shaped tissue marking clip. 3. In combination, the overall enhancement spans approximately 4 cm. 4. No MRI evidence of malignancy involving the RIGHT breast. 5. No pathologic lymphadenopathy.   08/27/2021 Genetic Testing   egative hereditary cancer genetic testing: no pathogenic variants detected in [facility] BRCAPlus Panel and CancerNext-Expanded +RNAinsight Panel.  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  The report dates are 08/27/2021 and 08/30/2021.   The BRCAplus panel offered by [employer] and  includes sequencing and deletion/duplication analysis for the following 8 genes: ATM, BRCA1, BRCA2, CDH1, CHEK2, PALB2, PTEN, and TP53.  The CancerNext-Expanded gene panel offered by [facility] and includes sequencing, rearrangement, and RNA analysis for the following 77 genes: AIP, ALK, APC, ATM, AXIN2, BAP1, BARD1, BLM, BMPR1A, BRCA1, BRCA2, BRIP1, CDC73, CDH1, CDK4, CDKN1B, CDKN2A, CHEK2, CTNNA1, DICER1, FANCC, FH, FLCN, GALNT12, KIF1B, LZTR1, MAX, MEN1, MET, MLH1, MSH2, MSH3, MSH6, MUTYH, NBN, NF1, NF2, NTHL1, PALB2, PHOX2B, PMS2, POT1, PRKAR1A, PTCH1, PTEN, RAD51C, RAD51D, RB1, RECQL, RET, SDHA, SDHAF2, SDHB, SDHC, SDHD, SMAD4, SMARCA4, SMARCB1, SMARCE1, STK11, SUFU, TMEM127, TP53, TSC1, TSC2, VHL and XRCC2 (sequencing and deletion/duplication); EGFR, EGLN1, HOXB13, KIT, MITF, PDGFRA, POLD1, and POLE (sequencing only); EPCAM and GREM1 (deletion/duplication only).    09/09/2021 Definitive Surgery   FINAL MICROSCOPIC DIAGNOSIS:   A. BREAST, LEFT, LUMPECTOMY:  -  Invasive ductal carcinoma, Nottingham grade 2 of 3, 3.5 cm  -  Ductal carcinoma in-situ, intermediate grade  -  Margins uninvolved by carcinoma (<0.1 cm; anterior)  -  Previous biopsy site changes present    09/09/2021 Oncotype testing   Oncotype DX was obtained on the final surgical sample and the recurrence score of 21 predicts a risk of recurrence outside the breast over the next 9 years of 7%, if the patient's only systemic therapy is an antiestrogen for 5 years.  It also predicts no benefit from chemotherapy.   10/20/2021 - 11/16/2021 Radiation Therapy   Site Technique Total Dose (Gy) Dose per Fx (Gy) Completed Fx Beam Energies  Breast, Left: Breast_L_axilla 3D 40.05/40.05 2.67 15/15 10X  Breast, Left: Breast_L_Bst 3D 10/10 2 5/5 6X, 10X     12/06/2021 - 04/2022 Anti-estrogen oral therapy   Tamoxifen x 5 years unable to tolerate, attempted anastrozole and had similar side effects.  Opted to forego adjuvant antiestrogen therapy       Discussed the use of AI scribe software for clinical note transcription with the patient, who gave verbal consent to proceed.  History of Present Illness   The patient is an 78 year old female with a history of breast cancer, anemia, and B12 deficiency. She presents today for follow-up of her anemia and B12 deficiency. She reports feeling fatigued and experiencing shortness of breath, which she attributes to her heart issues. She is currently undergoing pulmonary rehab and recently completed a six-minute walk test. Despite taking prenatal vitamins and receiving B12 shots, her hemoglobin has dropped since her last visit. She has not received a B12 shot in a couple of months due to scheduling issues. She also reports sensitivity in the area of her previous breast cancer and two bumps on her head that have appeared in the last month.         All other systems were reviewed with the patient and are negative.  MEDICAL HISTORY:  Past Medical History:  Diagnosis Date   Allergy 1990   Morphine following surgery   Arthritis    generalized   Atypical mole 02/24/2015   RGHT LATERAL THIGH MODERATE   Atypical mole 01/20/2022   Left Breast (mild)   Atypical mole 01/20/2022   Mid Back (mild)   Atypical nevi 02/24/2015   RIGHT NECK MILD   Atypical nevi 08/25/2015   LEFT UPPER ARM MILD   Atypical nevi 08/25/2015   LEFT LATERAL FOREARM MILD/FREE   Atypical nevi 02/23/2016   LEFT THIGH MODERATE/FREE  Atypical nevi 02/23/2016   LEFT MEDIAL LEG MODERATE/FREE   Atypical nevi 02/23/2016   LEFT UPPER BACK MILD /FREE   Atypical nevi 08/23/2016   RIGHT ANT PROX THIGH MILD /FREE   Atypical nevi 07/25/2018   MID LOWER BACK MODERATE   Atypical nevi 01/22/2019   LEFT MID BACK MILD /FREE   Atypical nevi 01/22/2019   LEFT NECK MILD   Atypical nevi 08/06/2019   LEFT INNER BREAST MODERATE W/S   Atypical nevi 08/06/2019   LEFT OUTER SIDE MILD/FREE   Blood transfusion without reported diagnosis     hx of   Breast cancer (HCC) 2022   CAD (coronary artery disease), native coronary artery 02/07/2019   Minimal CAD at the time of cath in 2016 in [facility]   Family history of breast cancer 08/19/2021   Family history of pancreatic cancer 08/19/2021   Fatty liver 05/21/2020   Gastric polyps 10/02/2018   EGD 03/2015; benign   GERD (gastroesophageal reflux disease) 10/02/2018   on meds   History of melanoma 10/02/2018   Abdomen 2012; 2013; s/p local excisions.    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 DISTAL LOWER LEG TREATED BY DR. Leda Quail   Sleep apnea    Sleep apnea 05/26/2020   uses CPAP   Tricuspid regurgitation    moderate by echo 04/2023    SURGICAL HISTORY: Past Surgical History:  Procedure Laterality Date   ABDOMINAL HYSTERECTOMY  1990   APPENDECTOMY  1962   BLADDER SUSPENSION  1990   BREAST LUMPECTOMY WITH RADIOACTIVE SEED LOCALIZATION Left 09/09/2021   Procedure: LEFT BREAST LUMPECTOMY WITH RADIOACTIVE SEED LOCALIZATION X2;  Surgeon: Manus Rudd, MD;  Location: [facility];  Service: General;  Laterality: Left;   COLONOSCOPY  2016   in [place]-hx of polyps   EYE SURGERY  2013 torn retina laser   MENISCUS REPAIR Right 2001   right knee   RIGHT HEART CATH AND CORONARY ANGIOGRAPHY N/A 06/20/2023   Procedure: RIGHT HEART CATH AND CORONARY ANGIOGRAPHY;  Surgeon: Laurey Morale, MD;  Location: [facility] INVASIVE CV LAB;  Service: Cardiovascular;  Laterality: N/A;   thumb surgery Left    TONSILLECTOMY AND ADENOIDECTOMY     tuabl ligation     TUBAL LIGATION  1975    WISDOM TOOTH EXTRACTION      I have reviewed the social history and family history with the patient and they are unchanged from previous note.  ALLERGIES:  is allergic to morphine, metoprolol, nebivolol, and morphine and codeine.  MEDICATIONS:  Current Outpatient Medications  Medication Sig Dispense Refill   aspirin EC 81 MG tablet Take 1 tablet (81 mg total) by mouth daily. Swallow whole. 30 tablet 12   diltiazem (CARDIZEM CD) 300 MG 24 hr capsule Take 1 capsule (300 mg total) by mouth daily. 90 capsule 3   furosemide (LASIX) 40 MG tablet Take 40 mg by mouth daily.     lisinopril (ZESTRIL) 10 MG tablet Take 10 mg by mouth daily.     omeprazole (PRILOSEC) 20 MG  capsule Take 20 mg by mouth daily.     Prenatal Vit-Fe Fumarate-FA (PRENATAL VITAMIN PO) Take by mouth daily. Prenatal w/folic acid po daily     Probiotic Product (ADVANCED PROBIOTIC PO) Take 1 capsule by mouth daily.     rosuvastatin (CRESTOR) 40 MG tablet TAKE 1 TABLET (40 MG TOTAL) BY MOUTH AT BEDTIME. 90 tablet 3   No current facility-administered medications for this visit.    PHYSICAL EXAMINATION: ECOG PERFORMANCE STATUS: 1 - Symptomatic but completely ambulatory  Vitals:   11/20/23 1411  BP: (!) 136/52  Pulse: 85  Resp: 20  Temp: 97.8 F (36.6 C)  SpO2: 95%   Wt Readings from Last 3 Encounters:  11/20/23 147 lb 12.8 oz (67 kg)  11/14/23 144 lb 13.5 oz (65.7 kg)  11/08/23 148 lb (67.1 kg)     GENERAL:alert, no distress and comfortable SKIN: skin color, texture, turgor are normal, no rashes or significant lesions EYES: normal, Conjunctiva are pink and non-injected, sclera clear NECK: supple, thyroid normal size, non-tender, without nodularity LYMPH:  no palpable lymphadenopathy in the cervical, axillary  LUNGS: clear to auscultation and percussion with normal breathing effort HEART: regular rate & rhythm and no murmurs and no lower extremity edema ABDOMEN:abdomen soft, non-tender and normal bowel  sounds Musculoskeletal:no cyanosis of digits and no clubbing  NEURO: alert & oriented x 3 with fluent speech, no focal motor/sensory deficits HEENT: Two subcutaneous bumps on scalp, no color change, skin intact. BREAST: Scar tissue palpable along incision line of left breast, no masses.      LABORATORY DATA:  I have reviewed the data as listed    Latest Ref Rng & Units 11/20/2023    1:50 PM 08/16/2023   12:35 PM 07/20/2023    3:04 PM  CBC  WBC 4.0 - 10.5 K/uL 4.4  6.9  5.5   Hemoglobin 12.0 - 15.0 g/dL 9.1  95.2  84.1   Hematocrit 36.0 - 46.0 % 27.9  33.8  32.6   Platelets 150 - 400 K/uL 193  204  234         Latest Ref Rng & Units 11/20/2023    2:50 PM 08/31/2023    3:42 PM 07/13/2023   11:32 AM  CMP  Glucose 70 - 99 mg/dL 324  401  027   BUN 8 - 23 mg/dL 24  23  19    Creatinine 0.44 - 1.00 mg/dL 2.53  6.64  4.03   Sodium 135 - 145 mmol/L 137  138  139   Potassium 3.5 - 5.1 mmol/L 3.5  5.0  3.7   Chloride 98 - 111 mmol/L 101  101  104   CO2 22 - 32 mmol/L 25  21  27    Calcium 8.9 - 10.3 mg/dL 9.5  47.4  9.9   Total Protein 6.5 - 8.1 g/dL 7.3   6.8   Total Bilirubin 0.0 - 1.2 mg/dL 0.5   1.0   Alkaline Phos 38 - 126 U/L 96   80   AST 15 - 41 U/L 39   29   ALT 0 - 44 U/L 20   28       RADIOGRAPHIC STUDIES: I have personally reviewed the radiological images as listed and agreed with the findings in the report. No results found.    Orders Placed This Encounter  Procedures   Ambulatory referral to Dermatology    Referral Priority:   Routine    Referral Type:   Consultation  Referral Reason:   Specialty Services Required    Requested Specialty:   Dermatology    Number of Visits Requested:   1   All questions were answered. The patient knows to call the clinic with any problems, questions or concerns. No barriers to learning was detected. The total time spent in the appointment was 30 minutes.     Malachy Mood, MD 11/20/2023

## 2023-11-20 NOTE — Assessment & Plan Note (Signed)
-  Her previous DEXA on 11/15/19 showed osteopenia (T-score -1.5). -Dexa 05/2022 showed T score -3.7 at forearm, -2.1 at hip; bisphosphonate was discussed with her last time. -she started Zometa in January 2024, plan to continue every 6 months for a total of 2 years.

## 2023-11-20 NOTE — Assessment & Plan Note (Signed)
-  Patient developed anemia with hemoglobin 9.2 in October 2024, anemia workup showed low B12 level at 162, intrinsic factor was negative -Patient has been on B12 injection since then with loading dose initially

## 2023-11-20 NOTE — Assessment & Plan Note (Signed)
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 Biopsy 08/11/21 confirmed invasive mammary carcinoma. -left lumpectomy on 09/09/21 by Dr. 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 No nodes were removed due to her age. -she received adjuvant RT, completed in 11/2021 -she started adjuvant tamoxifen in 12/2021, but could not tolerate. She subsequently tried anastrozole and still can not tolerate. She stopped in 04/2022 -Screening mammogram in May 2024 were negative. -continue breast cancer surveillance

## 2023-11-21 ENCOUNTER — Telehealth (HOSPITAL_COMMUNITY): Payer: Self-pay

## 2023-11-21 ENCOUNTER — Inpatient Hospital Stay (HOSPITAL_COMMUNITY)
Admission: RE | Admit: 2023-11-21 | Discharge: 2023-11-21 | Disposition: A | Discharge status: Home / Self Care | Payer: Medicare Other | Source: Ambulatory Visit

## 2023-11-21 ENCOUNTER — Telehealth: Payer: Self-pay | Admitting: Hematology

## 2023-11-21 NOTE — Telephone Encounter (Signed)
 Patient is aware of scheduled appointment times/dates as per scheduling orders on 11/20/2023

## 2023-11-21 NOTE — Telephone Encounter (Signed)
 Called Madison Clements since she was supposed to graduate today. Asked pt if she would like to be done today or come Thursday to finish up. Pt requested to be done today. Will discharge/ graduate pt.

## 2023-11-22 ENCOUNTER — Telehealth: Payer: Self-pay | Admitting: *Deleted

## 2023-11-22 ENCOUNTER — Telehealth: Payer: Self-pay | Admitting: Cardiology

## 2023-11-22 ENCOUNTER — Telehealth: Payer: Self-pay | Admitting: Family Medicine

## 2023-11-22 NOTE — Telephone Encounter (Signed)
 Patient returned call to follow-up on scheduling a TEE.

## 2023-11-22 NOTE — Telephone Encounter (Signed)
 [facility] faxed  Surgical Clearance, to be filled out by provider. Patient requested to send it back via Fax within 5-days. Document is located in providers tray at front office.Please advise at  [phone].

## 2023-11-22 NOTE — Telephone Encounter (Signed)
   Name: Madison Clements  DOB: March 06, 1946  MRN: [id number]  Primary Cardiologist: Armanda Magic, MD  Chart reviewed as part of pre-operative protocol coverage. The patient has an upcoming visit scheduled with Dr. Mayford Knife on 12/11/2023 at which time clearance can be addressed in case there are any issues that would impact surgical recommendations.  Right total knee arthroplasty is not scheduled until 02/26/2024 as below. I added preop FYI to appointment note so that provider is aware to address at time of outpatient visit.  Per office protocol the cardiology provider should forward their finalized clearance decision and recommendations regarding antiplatelet therapy to the requesting party below.    Aspirin hold can be addressed during office visit by primary cardiologist Dr. Mayford Knife.    I will route this message as FYI to requesting party and remove this message from the preop box as separate preop APP input not needed at this time.   Please call with any questions.  Denyce Robert, NP  11/22/2023, 2:22 PM

## 2023-11-22 NOTE — Telephone Encounter (Signed)
   Pre-operative Risk Assessment    Patient Name: Madison Clements  DOB: September 29, 1945 MRN: [id number]   Date of last office visit: 08/31/23 Eligha Bridegroom, NP Date of next office visit: 12/11/23 DR. TURNER   Request for Surgical Clearance    Procedure:   RIGHT TOTAL KNEE ARTHROPLASTY  Date of Surgery:  Clearance 02/26/24                                Surgeon:  DR. Ollen Gross Surgeon's Group or Practice Name:  [facility] Phone number:  [phone] Aida Raider Fax number:  [phone]   Type of Clearance Requested:   - Medical  - Pharmacy:  Hold Aspirin     Type of Anesthesia:   CHOICE   Additional requests/questions:    Elpidio Anis   11/22/2023, 11:25 AM

## 2023-11-22 NOTE — Telephone Encounter (Signed)
 Spoke with pt regarding an appointment with APP to get set up for a TEE as suggested by Dr. Mayford Knife. I was unable to find an APP appointment earlier than the appointment the pt has already with [facility] on 3/31. Pt was told to keep her appointment with Turner to get set up for a TEE. Pt verbalized understanding. All questions, if any, were answered.

## 2023-11-22 NOTE — Progress Notes (Signed)
 Discharge Progress Report  Patient Details  Name: Madison Clements MRN: [id number] Date of Birth: Jan 24, 1946 Referring Provider:   Doristine Devoid Pulmonary Rehab Walk Test from 08/23/2023 in [facility]  Referring Provider Turner        Number of Visits: 29  Reason for Discharge:  Patient has met program and personal goals.  Smoking History:  Social History   Tobacco Use  Smoking Status Former   Current packs/day: 0.00   Average packs/day: 0.3 packs/day for 10.0 years (2.5 ttl pk-yrs)   Types: Cigarettes   Start date: 09/12/1986   Quit date: 09/12/1996   Years since quitting: 27.2  Smokeless Tobacco Never    Diagnosis:  Pulmonary hypertension (HCC)  ADL UCSD:  Pulmonary Assessment Scores     Row Name 08/23/23 1110 11/16/23 1047       ADL UCSD   ADL Phase Entry Exit    SOB Score total 53 61      CAT Score   CAT Score 17 18      mMRC Score   mMRC Score 4 4             Initial Exercise Prescription:  Initial Exercise Prescription - 08/23/23 1100       Date of Initial Exercise RX and Referring Provider   Date 08/23/23    Referring Provider Turner    Expected Discharge Date 11/16/23      NuStep   Level 1    SPM 60    Minutes 15    METs 2      Recumbant Elliptical   Level 1    RPM 40    Watts 20    Minutes 15    METs 2      Prescription Details   Frequency (times per week) 2    Duration Progress to 30 minutes of continuous aerobic without signs/symptoms of physical distress      Intensity   THRR 40-80% of Max Heartrate 56-113    Ratings of Perceived Exertion 11-13    Perceived Dyspnea 0-4      Progression   Progression Continue to progress workloads to maintain intensity without signs/symptoms of physical distress.      Resistance Training   Training Prescription Yes    Weight blue bands    Reps 10-15             Discharge Exercise Prescription (Final Exercise  Prescription Changes):  Exercise Prescription Changes - 11/14/23 1100       Response to Exercise   Blood Pressure (Admit) 102/58    Blood Pressure (Exercise) 142/62    Blood Pressure (Exit) 96/56    Heart Rate (Admit) 70 bpm    Heart Rate (Exercise) 98 bpm    Heart Rate (Exit) 78 bpm    Oxygen Saturation (Admit) 97 %    Oxygen Saturation (Exercise) 94 %    Oxygen Saturation (Exit) 96 %    Rating of Perceived Exertion (Exercise) 14    Perceived Dyspnea (Exercise) 2    Duration Continue with 30 min of aerobic exercise without signs/symptoms of physical distress.    Intensity THRR unchanged      Progression   Progression Continue to progress workloads to maintain intensity without signs/symptoms of physical distress.      Resistance Training   Training Prescription Yes    Weight red bands    Reps 10-15    Time 10 Minutes  NuStep   Level 4    SPM 79    Minutes 15    METs 2.8      Recumbant Elliptical   Level 3    Minutes 15    METs 5.4             Functional Capacity:  6 Minute Walk     Row Name 08/23/23 1114 11/16/23 1115       6 Minute Walk   Phase Initial Discharge    Distance 1129 feet 1382 feet    Distance % Change -- 22.4 %    Distance Feet Change -- 253 ft    Walk Time 6 minutes 6 minutes    # of Rest Breaks 0 0    MPH 2.14 2.62    METS 2.12 2.66    RPE 13 17    Perceived Dyspnea  1 3    VO2 Peak 7.42 9.3    Symptoms No No    Resting HR 67 bpm 69 bpm    Resting BP 118/58 104/60    Resting Oxygen Saturation  97 % 97 %    Exercise Oxygen Saturation  during 6 min walk 95 % 95 %    Max Ex. HR 94 bpm 113 bpm    Max Ex. BP 158/56 140/60    2 Minute Post BP 124/58 122/60      Interval HR   1 Minute HR 83 88    2 Minute HR 91 107    3 Minute HR 94 112    4 Minute HR 93 112    5 Minute HR 94 113    6 Minute HR 91 113    2 Minute Post HR 75 90    Interval Heart Rate? Yes --      Interval Oxygen   Interval Oxygen? Yes --    Baseline  Oxygen Saturation % 97 % 97 %    1 Minute Oxygen Saturation % 96 % 97 %    1 Minute Liters of Oxygen 0 L 0 L    2 Minute Oxygen Saturation % 96 % 95 %    2 Minute Liters of Oxygen 0 L 0 L    3 Minute Oxygen Saturation % 96 % 97 %    3 Minute Liters of Oxygen 0 L 0 L    4 Minute Oxygen Saturation % 95 % 96 %    4 Minute Liters of Oxygen 0 L 0 L    5 Minute Oxygen Saturation % 95 % 97 %    5 Minute Liters of Oxygen 0 L 0 L    6 Minute Oxygen Saturation % 95 % 98 %    6 Minute Liters of Oxygen 0 L 0 L    2 Minute Post Oxygen Saturation % 98 % 99 %    2 Minute Post Liters of Oxygen 0 L 0 L             Psychological, QOL, Others - Outcomes: PHQ 2/9:    11/16/2023   10:48 AM 08/23/2023   11:09 AM 07/14/2023   10:04 AM 05/24/2023    9:57 AM 04/28/2023    9:08 AM  Depression screen PHQ 2/9  Decreased Interest 0 0 0 0 0  Down, Depressed, Hopeless 0 0 0 0 0  PHQ - 2 Score 0 0 0 0 0  Altered sleeping 0 0     Tired, decreased energy 1 1  Change in appetite 1 0     Feeling bad or failure about yourself  0 0     Trouble concentrating 0 0     Moving slowly or fidgety/restless 0 0     Suicidal thoughts 0 0     PHQ-9 Score 2 1     Difficult doing work/chores Not difficult at all Not difficult at all       Quality of Life:   Personal Goals: Goals established at orientation with interventions provided to work toward goal.  Personal Goals and Risk Factors at Admission - 08/23/23 1038       Core Components/Risk Factors/Patient Goals on Admission   Improve shortness of breath with ADL's Yes    Intervention Provide education, individualized exercise plan and daily activity instruction to help decrease symptoms of SOB with activities of daily living.    Expected Outcomes Short Term: Improve cardiorespiratory fitness to achieve a reduction of symptoms when performing ADLs;Long Term: Be able to perform more ADLs without symptoms or delay the onset of symptoms               Personal Goals Discharge:  Goals and Risk Factor Review     Row Name 09/01/23 0957 09/29/23 1044 10/25/23 1044 11/22/23 1502       Core Components/Risk Factors/Patient Goals Review   Personal Goals Review Improve shortness of breath with ADL's;Develop more efficient breathing techniques such as purse lipped breathing and diaphragmatic breathing and practicing self-pacing with activity. Improve shortness of breath with ADL's;Develop more efficient breathing techniques such as purse lipped breathing and diaphragmatic breathing and practicing self-pacing with activity. Improve shortness of breath with ADL's;Develop more efficient breathing techniques such as purse lipped breathing and diaphragmatic breathing and practicing self-pacing with activity. Improve shortness of breath with ADL's    Review [name] is scheduled to start the PR program on 09/05/23. Goal progressing for improving shortness of breath with ADL's. Goal progressing for developing more efficient breathing techniques such as purse lipped breathing and diaphragmatic breathing; and practicing self-pacing with activity. Goal progressing on improving her shortness of breath with ADLs. Goal progressing on developing more efficient breathing techniques such as purse lipped breathing and diaphragmatic breathing; and practicing self-pacing with activity. [name] has remained on RA to keep her sats >88% while exercising. We will continue to monitor Sylvia's progress throughout the program. Goal progressing on improving her shortness of breath with ADLs. Goal met on developing more efficient breathing techniques such as purse lipped breathing and diaphragmatic breathing; and practicing self-pacing with activity. [name] is able to demonstrate purse lip breathing when she gets short of breath. She also knows how to pace herself based on her rate of perceived exertion score. [name] has remained on RA to keep her sats >88% while exercising. She is currently  exercising on the recumbant elliptical and the Nustep. We will continue to monitor Sylvia's progress throughout the program. [name] graduated from the PR program on 11/21/23. Goal was not met on improving SOB with ADL's. Her SOB scores increased from 53 to 61.    Expected Outcomes To improve shortness of breath with ADL's and develop more efficient breathing techniques such as purse lipped breathing and diaphragmatic breathing; and practicing self-pacing with activity. To improve shortness of breath with ADL's and develop more efficient breathing techniques such as purse lipped breathing and diaphragmatic breathing; and practicing self-pacing with activity. To improve shortness of breath with ADL's Patient will continue to benefit from ongoing nutrition, exercise, and lifestyle  modification             Exercise Goals and Review:  Exercise Goals     Row Name 08/23/23 1039             Exercise Goals   Increase Physical Activity Yes       Intervention Provide advice, education, support and counseling about physical activity/exercise needs.;Develop an individualized exercise prescription for aerobic and resistive training based on initial evaluation findings, risk stratification, comorbidities and participant's personal goals.       Expected Outcomes Short Term: Attend rehab on a regular basis to increase amount of physical activity.;Long Term: Add in home exercise to make exercise part of routine and to increase amount of physical activity.;Long Term: Exercising regularly at least 3-5 days a week.       Increase Strength and Stamina Yes       Intervention Provide advice, education, support and counseling about physical activity/exercise needs.;Develop an individualized exercise prescription for aerobic and resistive training based on initial evaluation findings, risk stratification, comorbidities and participant's personal goals.       Expected Outcomes Short Term: Increase workloads from initial  exercise prescription for resistance, speed, and METs.;Short Term: Perform resistance training exercises routinely during rehab and add in resistance training at home;Long Term: Improve cardiorespiratory fitness, muscular endurance and strength as measured by increased METs and functional capacity ( )       Able to understand and use rate of perceived exertion (RPE) scale Yes       Intervention Provide education and explanation on how to use RPE scale       Expected Outcomes Short Term: Able to use RPE daily in rehab to express subjective intensity level;Long Term:  Able to use RPE to guide intensity level when exercising independently       Able to understand and use Dyspnea scale Yes       Intervention Provide education and explanation on how to use Dyspnea scale       Expected Outcomes Short Term: Able to use Dyspnea scale daily in rehab to express subjective sense of shortness of breath during exertion;Long Term: Able to use Dyspnea scale to guide intensity level when exercising independently       Knowledge and understanding of Target Heart Rate Range (THRR) Yes       Intervention Provide education and explanation of THRR including how the numbers were predicted and where they are located for reference       Expected Outcomes Short Term: Able to state/look up THRR;Long Term: Able to use THRR to govern intensity when exercising independently;Short Term: Able to use daily as guideline for intensity in rehab       Understanding of Exercise Prescription Yes       Intervention Provide education, explanation, and written materials on patient's individual exercise prescription       Expected Outcomes Short Term: Able to explain program exercise prescription;Long Term: Able to explain home exercise prescription to exercise independently                Exercise Goals Re-Evaluation:  Exercise Goals Re-Evaluation     Row Name 08/28/23 0735 09/29/23 1103 10/23/23 1135 11/22/23 0826        Exercise Goal Re-Evaluation   Exercise Goals Review Increase Physical Activity;Able to understand and use Dyspnea scale;Understanding of Exercise Prescription;Increase Strength and Stamina;Knowledge and understanding of Target Heart Rate Range (THRR);Able to understand and use rate of perceived exertion (RPE) scale Increase Physical  Activity;Able to understand and use Dyspnea scale;Understanding of Exercise Prescription;Increase Strength and Stamina;Knowledge and understanding of Target Heart Rate Range (THRR);Able to understand and use rate of perceived exertion (RPE) scale Increase Physical Activity;Able to understand and use Dyspnea scale;Understanding of Exercise Prescription;Increase Strength and Stamina;Knowledge and understanding of Target Heart Rate Range (THRR);Able to understand and use rate of perceived exertion (RPE) scale Increase Physical Activity;Able to understand and use Dyspnea scale;Understanding of Exercise Prescription;Increase Strength and Stamina;Knowledge and understanding of Target Heart Rate Range (THRR);Able to understand and use rate of perceived exertion (RPE) scale    Comments [name] is scheduled to start exercise on 12/24. Will continue to monitor and progress as able. [name] has completed 8 exercise sessions. She exercises for 15 min on the recumbent elliptical and Nustep. She averages 4.9 METs at level 2 on the recumbent elliptical and 3.2 METs at level 2 on the Nustep. [name] performs the warmup and cooldown standing without limitations. She has increased her workload for both exercise modes as METs have significantly increased on the recumbent elliptical and Nustep. [name] toterates progressions well and seems to like rehab. Will continue to monitor and progress as able. [name] has completed 13 exercise sessions. She exercises for 15 min on the recumbent elliptical and Nustep. She averages 3.8 METs at level 3 on the recumbent elliptical and 3.0 METs at level 3 on the Nustep.  [name] performs the warmup and cooldown standing without limitations. [name] continues her gradual progress on both exercise modes. She has missed a couple sessions due to personal commitments. Will continue to monitor and progress as able. [name] has completed 19 exercise sessions. her peak METs were 5.4 on the recumbent elliptical and 2.9 on the Nustep. [name] plans to continue exercise at home.    Expected Outcomes Through exercise at rehab and home, the patient will decrease shortness of breath with daily activities and feel confident in carrying out an exercise regimen at home. Through exercise at rehab and home, the patient will decrease shortness of breath with daily activities and feel confident in carrying out an exercise regimen at home. Through exercise at rehab and home, the patient will decrease shortness of breath with daily activities and feel confident in carrying out an exercise regimen at home. Through exercise at rehab and home, the patient will decrease shortness of breath with daily activities and feel confident in carrying out an exercise regimen at home.             Nutrition & Weight - Outcomes:  Pre Biometrics - 08/23/23 1024       Pre Biometrics   Grip Strength 30 kg              Nutrition:   Nutrition Discharge:   Education Questionnaire Score:  Knowledge Questionnaire Score - 11/16/23 1048       Knowledge Questionnaire Score   Post Score 17/18             Goals reviewed with patient; copy given to patient.

## 2023-11-23 ENCOUNTER — Other Ambulatory Visit: Payer: Self-pay | Admitting: Hematology

## 2023-11-23 ENCOUNTER — Ambulatory Visit
Admission: RE | Admit: 2023-11-23 | Discharge: 2023-11-23 | Discharge status: Home / Self Care | Source: Ambulatory Visit | Attending: Hematology

## 2023-11-23 ENCOUNTER — Ambulatory Visit
Admission: RE | Admit: 2023-11-23 | Discharge: 2023-11-23 | Disposition: A | Discharge status: Home / Self Care | Source: Ambulatory Visit | Attending: Hematology | Admitting: Hematology

## 2023-11-23 DIAGNOSIS — Z9889 Other specified postprocedural states: Secondary | ICD-10-CM

## 2023-11-23 DIAGNOSIS — N6324 Unspecified lump in the left breast, lower inner quadrant: Secondary | ICD-10-CM | POA: Diagnosis not present

## 2023-11-23 DIAGNOSIS — Z853 Personal history of malignant neoplasm of breast: Secondary | ICD-10-CM

## 2023-11-23 DIAGNOSIS — N6320 Unspecified lump in the left breast, unspecified quadrant: Secondary | ICD-10-CM

## 2023-11-23 DIAGNOSIS — R92322 Mammographic fibroglandular density, left breast: Secondary | ICD-10-CM | POA: Diagnosis not present

## 2023-11-24 DIAGNOSIS — Z0279 Encounter for issue of other medical certificate: Secondary | ICD-10-CM

## 2023-11-28 DIAGNOSIS — H25811 Combined forms of age-related cataract, right eye: Secondary | ICD-10-CM | POA: Diagnosis not present

## 2023-11-28 DIAGNOSIS — Z961 Presence of intraocular lens: Secondary | ICD-10-CM | POA: Diagnosis not present

## 2023-11-28 DIAGNOSIS — H2511 Age-related nuclear cataract, right eye: Secondary | ICD-10-CM | POA: Diagnosis not present

## 2023-11-28 DIAGNOSIS — H25011 Cortical age-related cataract, right eye: Secondary | ICD-10-CM | POA: Diagnosis not present

## 2023-11-28 NOTE — Telephone Encounter (Signed)
 Form has been faxed.

## 2023-12-04 ENCOUNTER — Ambulatory Visit (INDEPENDENT_AMBULATORY_CARE_PROVIDER_SITE_OTHER): Admitting: Family Medicine

## 2023-12-04 ENCOUNTER — Ambulatory Visit

## 2023-12-04 ENCOUNTER — Other Ambulatory Visit: Payer: Self-pay | Admitting: Hematology

## 2023-12-04 VITALS — BP 120/70 | HR 74 | Temp 97.7°F | Ht 63.0 in | Wt 139.8 lb

## 2023-12-04 DIAGNOSIS — C50812 Malignant neoplasm of overlapping sites of left female breast: Secondary | ICD-10-CM

## 2023-12-04 DIAGNOSIS — I10 Essential (primary) hypertension: Secondary | ICD-10-CM | POA: Diagnosis not present

## 2023-12-04 DIAGNOSIS — R059 Cough, unspecified: Secondary | ICD-10-CM | POA: Diagnosis not present

## 2023-12-04 DIAGNOSIS — I270 Primary pulmonary hypertension: Secondary | ICD-10-CM | POA: Diagnosis not present

## 2023-12-04 DIAGNOSIS — J208 Acute bronchitis due to other specified organisms: Secondary | ICD-10-CM | POA: Diagnosis not present

## 2023-12-04 DIAGNOSIS — B9689 Other specified bacterial agents as the cause of diseases classified elsewhere: Secondary | ICD-10-CM | POA: Diagnosis not present

## 2023-12-04 DIAGNOSIS — R918 Other nonspecific abnormal finding of lung field: Secondary | ICD-10-CM | POA: Diagnosis not present

## 2023-12-04 MED ORDER — PROMETHAZINE-DM 6.25-15 MG/5ML PO SYRP: 5.0000 mL | ORAL_SOLUTION | Freq: Four times a day (QID) | ORAL | 0 refills | Status: DC | PRN

## 2023-12-04 MED ORDER — BENZONATATE 200 MG PO CAPS: 200.0000 mg | ORAL_CAPSULE | Freq: Two times a day (BID) | ORAL | 0 refills | Status: DC | PRN

## 2023-12-04 MED ORDER — AZITHROMYCIN 250 MG PO TABS: ORAL_TABLET | ORAL | 0 refills | Status: DC

## 2023-12-04 NOTE — Patient Instructions (Signed)
 Please follow up if symptoms do not improve or as needed.    VISIT SUMMARY:  Today, we discussed your persistent cough and associated symptoms that have been ongoing for about five weeks. We also addressed your recent weight loss, mild anemia, and elevated blood pressure.  YOUR PLAN:  -ACUTE RESPIRATORY INFECTION: You have an acute respiratory infection, which is an infection that affects your airways and can be caused by viruses or bacteria. We will do a chest x-ray to get a better look at your lungs. I have prescribed antibiotics and a cough medicine that includes an anti-nausea component to help manage your symptoms. Additionally, I have prescribed Tessalon Perles to help reduce your cough. Please take vitamin C, zinc, and a multivitamin to support your immune system. Monitor your symptoms and let us know if they worsen.  -WEIGHT LOSS: You have experienced significant weight loss due to a decrease in appetite. It is important to maintain good nutrition to help your body recover. Please try to eat well and monitor your weight. If you continue to lose weight or your appetite does not improve, let us know.  -ANEMIA: You have mild anemia, which means you have a lower than normal number of red blood cells. This can make you feel tired. We will keep an eye on this as you recover from your infection.  -HYPERTENSION: Your blood pressure is currently elevated, likely due to your illness. Your home readings are normal, so this is not a chronic issue. We will continue to monitor your blood pressure.  INSTRUCTIONS:  Please get a chest x-ray as soon as possible. Take the prescribed antibiotics and cough medicine as directed. Continue to monitor your symptoms, weight, and blood pressure. Follow up with [place] if your symptoms worsen or if you experience persistent appetite loss or weight loss.

## 2023-12-04 NOTE — Progress Notes (Signed)
 Subjective  CC:  Chief Complaint  Patient presents with   Cough    Pt stated that she has had a cough for the past 5 weeks that never went away. COVID test was negative    HPI: Madison Clements is a 78 y.o. female who presents to the office today to address the problems listed above in the chief complaint. Discussed the use of AI scribe software for clinical note transcription with the patient, who gave verbal consent to proceed.  History of Present Illness   Madison Clements "[name]" is an 78 year old female who presents with a persistent cough and associated symptoms.  She has been experiencing a persistent cough for approximately five weeks. Initially, the symptoms included a cough, headache, body aches, chills, and a low-grade fever, which improved slightly but never fully resolved. The symptoms worsened again last Thursday, with the cough becoming more productive and harsh. No shortness of breath, wheezing, or chest pain, but she notes some baseline shortness of breath.  There has been a significant decrease in appetite since the onset of symptoms five weeks ago, leading to noticeable weight loss. No nausea, vomiting, or diarrhea, but she feels generally exhausted. She has not experienced any fever today and is not currently aching as much.  She took a COVID test on Friday or Saturday, which was negative. She has not been exposed to anyone with the flu.  During the initial episode five weeks ago, she managed symptoms with over-the-counter medications. Currently, she is not taking any medication for the cough due to concerns about nausea. She mentions that she is sleeping well and does not cough at night. She has been blowing her nose frequently but denies any sinus pain. She has not had a chest x-ray in a while.     Wt Readings from Last 3 Encounters:  12/04/23 139 lb 12.8 oz (63.4 kg)  11/20/23 147 lb 12.8 oz (67 kg)  11/14/23 144 lb 13.5 oz (65.7 kg)     Assessment   1. Acute bacterial bronchitis   2. Pulmonary hypertension, primary (HCC)   3. Essential hypertension      Plan  Assessment and Plan    Acute Respiratory Infection Symptoms suggest acute respiratory infection with prolonged course. Differential includes viral or bacterial etiology. Negative COVID-19 tests, no influenza exposure. Chest x-ray needed due to symptom duration. - Order chest x-ray. - Prescribe antibiotics. Zpak - Prescribe cough medicine with anti-nausea component.phenergan/DM - Prescribe Tessalon Perles. - Advise vitamin C, zinc, multivitamin. - Monitor symptoms.  Weight Loss Significant weight loss with appetite loss. Nutritional intake crucial to prevent further loss and aid recovery. - Encourage nutritional intake. - Monitor weight. - Advise reporting persistent appetite loss or weight loss.  Anemia Mild anemia contributing to fatigue.  Hypertension Elevated blood pressure likely due to illness, not chronic. Home readings normal.        Orders Placed This Encounter  Procedures   DG Chest 2 View   Meds ordered this encounter  Medications   azithromycin (ZITHROMAX) 250 MG tablet    Sig: Take 2 tabs today, then 1 tab daily for 4 days    Dispense:  1 each    Refill:  0   promethazine-dextromethorphan (PROMETHAZINE-DM) 6.25-15 MG/5ML syrup    Sig: Take 5 mLs by mouth 4 (four) times daily as needed for cough.    Dispense:  180 mL    Refill:  0   benzonatate (TESSALON) 200 MG capsule  Sig: Take 1 capsule (200 mg total) by mouth 2 (two) times daily as needed for cough.    Dispense:  20 capsule    Refill:  0     I reviewed the patients updated PMH, FH, and SocHx.    Patient Active Problem List   Diagnosis Date Noted   Tricuspid regurgitation     Priority: High   SVT (supraventricular tachycardia) (HCC) 07/11/2022    Priority: High   Malignant neoplasm of overlapping sites of left breast in female, estrogen receptor positive (HCC) 08/16/2021     Priority: High   OSA (obstructive sleep apnea) 05/26/2020    Priority: High   Coronary artery disease involving native coronary artery of native heart without angina pectoris 02/07/2019    Priority: High   Essential hypertension 10/02/2018    Priority: High   Mixed hyperlipidemia 10/02/2018    Priority: High   Pulmonary hypertension, primary (HCC) 10/02/2018    Priority: High   History of melanoma 10/02/2018    Priority: High   Polyp of colon, adenomatous 10/02/2018    Priority: High   Primary osteoarthritis of right shoulder 04/11/2023    Priority: Medium    Hepatic steatosis 07/28/2020    Priority: Medium    GERD (gastroesophageal reflux disease) 10/02/2018    Priority: Medium    Osteoporosis of forearm 10/02/2018    Priority: Medium    Primary osteoarthritis involving multiple joints 10/02/2018    Priority: Medium    Gastric polyps 10/02/2018    Priority: Medium    B12 deficiency anemia 10/29/2023   Trochanteric bursitis of both hips 04/11/2023   Atrial tachycardia (HCC) 02/09/2023   Osteopenia 09/19/2022   Genetic testing 08/30/2021   Family history of breast cancer 08/19/2021   Family history of pancreatic cancer 08/19/2021   Current Meds  Medication Sig   aspirin EC 81 MG tablet Take 1 tablet (81 mg total) by mouth daily. Swallow whole.   azithromycin (ZITHROMAX) 250 MG tablet Take 2 tabs today, then 1 tab daily for 4 days   benzonatate (TESSALON) 200 MG capsule Take 1 capsule (200 mg total) by mouth 2 (two) times daily as needed for cough.   diltiazem (CARDIZEM CD) 300 MG 24 hr capsule Take 1 capsule (300 mg total) by mouth daily.   furosemide (LASIX) 40 MG tablet Take 40 mg by mouth daily.   lisinopril (ZESTRIL) 10 MG tablet Take 10 mg by mouth daily.   omeprazole (PRILOSEC) 20 MG capsule Take 20 mg by mouth daily.   Prenatal Vit-Fe Fumarate-FA (PRENATAL VITAMIN PO) Take by mouth daily. Prenatal w/folic acid po daily   Probiotic Product (ADVANCED PROBIOTIC PO)  Take 1 capsule by mouth daily.   promethazine-dextromethorphan (PROMETHAZINE-DM) 6.25-15 MG/5ML syrup Take 5 mLs by mouth 4 (four) times daily as needed for cough.   rosuvastatin (CRESTOR) 40 MG tablet TAKE 1 TABLET (40 MG TOTAL) BY MOUTH AT BEDTIME.    Allergies: Patient is allergic to morphine, metoprolol, nebivolol, and morphine and codeine. Family History: Patient family history includes Arthritis in her mother, paternal grandmother, and sister; Asthma in her maternal grandfather and paternal grandfather; Breast cancer (age of onset: 41) in her sister; COPD in her paternal grandfather; Diabetes in her sister; Early death in her maternal grandmother; Heart attack in her father; Heart disease in her father, maternal uncle, maternal uncle, and paternal grandmother; High Cholesterol in her sister; High blood pressure in her father, maternal grandmother, mother, paternal grandmother, and sister; Hyperlipidemia in her mother;  Hypertension in her father, maternal grandmother, mother, and paternal grandmother; Kidney disease in her maternal grandfather and paternal grandfather; Pancreatic cancer in her maternal aunt; Prostate cancer in her cousin; Stroke in her maternal grandmother, mother, and paternal aunt; Varicose Veins in her mother and sister. Social History:  Patient  reports that she quit smoking about 27 years ago. Her smoking use included cigarettes. She started smoking about 37 years ago. She has a 2.5 pack-year smoking history. She has never used smokeless tobacco. She reports that she does not currently use alcohol. She reports that she does not use drugs.  Review of Systems: Constitutional: Negative for fever malaise or anorexia Cardiovascular: negative for chest pain Respiratory: negative for SOB or persistent cough Gastrointestinal: negative for abdominal pain  Objective  Vitals: BP 120/70 Comment: home reading this morning  Pulse 74   Temp 97.7 F (36.5 C)   Ht 5\' 3"  (1.6 m)   Wt  139 lb 12.8 oz (63.4 kg)   SpO2 99%   BMI 24.76 kg/m  General: no acute distress , A&Ox3, spasm cough present HEENT: PEERL, conjunctiva normal, neck is supple Cardiovascular:  RRR without murmur or gallop.  Respiratory:  Good breath sounds bilaterally, CTAB with normal respiratory effort Skin:  Warm, no rashes  Commons side effects, risks, benefits, and alternatives for medications and treatment plan prescribed today were discussed, and the patient expressed understanding of the given instructions. Patient is instructed to call or message via MyChart if he/she has any questions or concerns regarding our treatment plan. No barriers to understanding were identified. We discussed Red Flag symptoms and signs in detail. Patient expressed understanding regarding what to do in case of urgent or emergency type symptoms.  Medication list was reconciled, printed and provided to the patient in AVS. Patient instructions and summary information was reviewed with the patient as documented in the AVS. This note was prepared with assistance of Dragon voice recognition software. Occasional wrong-word or sound-a-like substitutions may have occurred due to the inherent limitations of voice recognition software

## 2023-12-08 ENCOUNTER — Encounter: Payer: Self-pay | Admitting: Family Medicine

## 2023-12-11 ENCOUNTER — Encounter: Payer: Self-pay | Admitting: Cardiology

## 2023-12-11 ENCOUNTER — Other Ambulatory Visit: Payer: Self-pay

## 2023-12-11 ENCOUNTER — Other Ambulatory Visit: Payer: Self-pay | Admitting: Cardiology

## 2023-12-11 ENCOUNTER — Ambulatory Visit: Payer: Medicare Other | Attending: Cardiology | Admitting: Cardiology

## 2023-12-11 VITALS — BP 126/58 | HR 72 | Resp 16 | Ht 63.0 in | Wt 139.6 lb

## 2023-12-11 DIAGNOSIS — I270 Primary pulmonary hypertension: Secondary | ICD-10-CM | POA: Insufficient documentation

## 2023-12-11 DIAGNOSIS — I272 Pulmonary hypertension, unspecified: Secondary | ICD-10-CM | POA: Diagnosis not present

## 2023-12-11 DIAGNOSIS — I361 Nonrheumatic tricuspid (valve) insufficiency: Secondary | ICD-10-CM | POA: Insufficient documentation

## 2023-12-11 DIAGNOSIS — E785 Hyperlipidemia, unspecified: Secondary | ICD-10-CM | POA: Diagnosis not present

## 2023-12-11 DIAGNOSIS — I2510 Atherosclerotic heart disease of native coronary artery without angina pectoris: Secondary | ICD-10-CM | POA: Insufficient documentation

## 2023-12-11 DIAGNOSIS — G4733 Obstructive sleep apnea (adult) (pediatric): Secondary | ICD-10-CM | POA: Insufficient documentation

## 2023-12-11 DIAGNOSIS — Z170 Estrogen receptor positive status [ER+]: Secondary | ICD-10-CM

## 2023-12-11 DIAGNOSIS — I471 Supraventricular tachycardia, unspecified: Secondary | ICD-10-CM | POA: Insufficient documentation

## 2023-12-11 DIAGNOSIS — I071 Rheumatic tricuspid insufficiency: Secondary | ICD-10-CM

## 2023-12-11 DIAGNOSIS — R0609 Other forms of dyspnea: Secondary | ICD-10-CM

## 2023-12-11 NOTE — H&P (View-Only) (Signed)
 Cardiology Office Note:    Date:  12/11/2023   ID:  Madison Clements, DOB 05-31-1946, MRN [id number]  PCP:  Willow Ora, MD  Cardiologist:  Armanda Magic, MD    Referring MD: Willow Ora, MD   Chief Complaint  Patient presents with   Follow-up    Pulmonary hypertension, shortness of breath, CAD, hyperlipidemia, hypertension, obstructive sleep apnea, PVCs, atrial tachycardia, tricuspid regurgitation     History of Present Illness:    Madison Clements is a 78 y.o. female with a hx of  hyperlipidemia, SVT/PACs/PVCs followed by EP, GERD, ASCAD, hypertension and pulmonary hypertension.  She had an echocardiogram done and 2018 in [place] which showed normal LV function with the EF 55 to 60%, grade 2 diastolic dysfunction with mild to moderate TR and mild pulmonary hypertension with RVSP 39 mmHg.  She has been on Lasix 40 mg daily.  Cardiac catheterization done in 2016 showed mild CAD with calcified vessels.    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  2D echo done 04/05/2022 showed normal LV function with EF 60 to 65% and 1 diastolic dysfunction, mild to moderate TR and normal RVSP at 29 mmHg.   Her last echo done 05/11/2023 showed EF 60 to 65% and normal RV size and function but severe TR.  PA systolic pressure could not be estimated because of the eccentricity of the TR jet.  She had a home sleep study done showing moderate OSA with AHI 18/hr and O2 sats as low as 72%.  She became very frustrated with her CPAP and wanted to be evaluated for the inspire device and was referred to ENT and felt to be a good candidate for inspire and was scheduled for a sleep induced endoscopy.  Unfortunately she was diagnosed with breast cancer in the interim and the sleep induced endoscopy was postponed.  She underwent lumpectomy on 09/09/2021 s/p XRT.  She decided  to stay on CPAP for now.  She is here today for followup and is doing well.  She continues to have problems with chronic fatigue which she has had for several years. She denies any chest pain or pressure, SOB, DOE, PND, orthopnea, LE edema, dizziness or syncope. She has been having problems with racing of her heart beat after she has showered, emptied the dishwasher or any exertional activities. She is compliant with her meds and is tolerating meds with no SE.    She is doing well with her PAP device and thinks that she has gotten used to it.  She tolerates the mask and feels the pressure is adequate.  Since going on PAP she feels rested in the am and has no significant daytime sleepiness.  She denies any significant mouth or nasal dryness or nasal congestion.  She does not think that he snores.    Past Medical History:  Diagnosis Date   Allergy 1990   Morphine following surgery   Arthritis    generalized   Atypical mole 02/24/2015   RGHT LATERAL THIGH MODERATE   Atypical mole 01/20/2022   Left Breast (mild)   Atypical mole 01/20/2022   Mid Back (mild)   Atypical nevi 02/24/2015   RIGHT NECK MILD   Atypical nevi 08/25/2015   LEFT UPPER ARM MILD   Atypical nevi 08/25/2015   LEFT LATERAL FOREARM MILD/FREE   Atypical nevi 02/23/2016   LEFT  THIGH MODERATE/FREE   Atypical nevi 02/23/2016   LEFT MEDIAL LEG MODERATE/FREE   Atypical nevi 02/23/2016   LEFT UPPER BACK MILD /FREE   Atypical nevi 08/23/2016   RIGHT ANT PROX THIGH MILD /FREE   Atypical nevi 07/25/2018   MID LOWER BACK MODERATE   Atypical nevi 01/22/2019   LEFT MID BACK MILD /FREE   Atypical nevi 01/22/2019   LEFT NECK MILD   Atypical nevi 08/06/2019   LEFT INNER BREAST MODERATE W/S   Atypical nevi 08/06/2019   LEFT OUTER SIDE MILD/FREE   Blood transfusion without reported diagnosis    hx of   Breast cancer (HCC) 2022   CAD (coronary artery disease), native coronary artery 02/07/2019   Minimal CAD at the time of cath  in 2016 in [facility] [place]   Family history of breast cancer 08/19/2021   Family history of pancreatic cancer 08/19/2021   Fatty liver 05/21/2020   Gastric polyps 10/02/2018   EGD 03/2015; benign   GERD (gastroesophageal reflux disease) 10/02/2018   on meds   History of melanoma 10/02/2018   Abdomen 2012; 2013; s/p local excisions.    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 DISTAL LOWER LEG TREATED BY DR. Leda Quail   Sleep apnea    Sleep apnea 05/26/2020   uses CPAP   Tricuspid regurgitation    moderate by echo 04/2023    Past Surgical History:  Procedure Laterality Date   ABDOMINAL HYSTERECTOMY  1990   APPENDECTOMY  1962   BLADDER SUSPENSION  1990   BREAST LUMPECTOMY WITH RADIOACTIVE SEED LOCALIZATION Left 09/09/2021   Procedure: LEFT BREAST LUMPECTOMY WITH RADIOACTIVE SEED LOCALIZATION X2;  Surgeon: Manus Rudd, MD;  Location: [facility];  Service: General;  Laterality: Left;   COLONOSCOPY  2016   in [place]-hx of polyps   EYE SURGERY  2013 torn retina laser   MENISCUS REPAIR Right 2001   right knee   RIGHT HEART CATH AND CORONARY ANGIOGRAPHY N/A 06/20/2023   Procedure: RIGHT HEART CATH AND CORONARY ANGIOGRAPHY;  Surgeon: Laurey Morale, MD;  Location: [facility] INVASIVE CV LAB;  Service: Cardiovascular;  Laterality: N/A;   thumb surgery Left    TONSILLECTOMY AND ADENOIDECTOMY     tuabl ligation     TUBAL LIGATION  1975   WISDOM TOOTH EXTRACTION      Current Medications: Current Meds  Medication Sig   aspirin EC 81 MG tablet Take 1 tablet (81 mg total) by mouth daily.  Swallow whole.   benzonatate (TESSALON) 200 MG capsule Take 1 capsule (200 mg total) by mouth 2 (two) times daily as needed for cough.   diltiazem (CARDIZEM CD) 300 MG 24 hr capsule Take 1 capsule (300 mg total) by mouth daily.   furosemide (LASIX) 40 MG tablet Take 40 mg by mouth daily.   lisinopril (ZESTRIL) 10 MG tablet Take 10 mg by mouth daily.   omeprazole (PRILOSEC) 20 MG capsule Take 20 mg by mouth daily.   Prenatal Vit-Fe Fumarate-FA (PRENATAL VITAMIN PO) Take by mouth daily. Prenatal w/folic  acid po daily   Probiotic Product (ADVANCED PROBIOTIC PO) Take 1 capsule by mouth daily.   promethazine-dextromethorphan (PROMETHAZINE-DM) 6.25-15 MG/5ML syrup Take 5 mLs by mouth 4 (four) times daily as needed for cough.   rosuvastatin (CRESTOR) 40 MG tablet TAKE 1 TABLET (40 MG TOTAL) BY MOUTH AT BEDTIME.     Allergies:   Morphine, Metoprolol, Nebivolol, and Morphine and codeine   Social History   Socioeconomic History   Marital status: Widowed    Spouse name: Not on file   Number of children: 1   Years of education: Not on file   Highest education level: Some college, no degree  Occupational History   Occupation: Retired [employer]  Tobacco Use   Smoking status: Former    Current packs/day: 0.00    Average packs/day: 0.3 packs/day for 10.0 years (2.5 ttl pk-yrs)    Types: Cigarettes    Start date: 09/12/1986    Quit date: 09/12/1996    Years since quitting: 27.2   Smokeless tobacco: Never  Vaping Use   Vaping status: Never Used  Substance and Sexual Activity   Alcohol use: Not Currently    Comment: occassional    Drug use: Never   Sexual activity: Not Currently    Birth control/protection: Abstinence, Post-menopausal    Comment: hysterectomy  Other Topics Concern   Not on file  Social History Narrative   Widowed 2018 after 40 years of marriage   One daughter, 2 grandchildren. Lives in [place]   Sister lives in same complex in [place]    Previously lived in [place], [place]    Social Drivers of Health   Financial Resource Strain: Low Risk  (12/04/2023)   Overall Financial Resource Strain (CARDIA)    Difficulty of Paying Living Expenses: Not hard at all  Food Insecurity: No Food Insecurity (12/04/2023)   Hunger Vital Sign    Worried About Running Out of Food in the Last Year: Never true    Ran Out of Food in the Last Year: Never true  Transportation Needs: No Transportation Needs (12/04/2023)   PRAPARE - Administrator, Civil Service (Medical): No    Lack of Transportation (Non-Medical): No  Physical Activity: Insufficiently Active (12/04/2023)   Exercise Vital Sign    Days of Exercise per Week: 3 days    Minutes of Exercise per Session: 20 min  Stress: No Stress Concern Present (12/04/2023)   [employer] of Occupational Health - Occupational Stress Questionnaire    Feeling of Stress : Not at all  Social Connections: Moderately Integrated (12/04/2023)   Social Connection and Isolation Panel [NHANES]    Frequency of Communication with Friends and Family: More than three times a week    Frequency of Social Gatherings with Friends and Family: More than three times a week    Attends Religious Services: More than 4 times per year    Active Member of [employer] or Organizations: Yes    Attends Banker Meetings: More than 4 times per year    Marital Status: Widowed     Family History: The patient's family history includes Arthritis in her mother, paternal grandmother, and sister; Asthma in her maternal grandfather and paternal grandfather; Breast cancer (age of onset: 72) in her sister; COPD in her paternal grandfather; Diabetes in her sister; Early death in her maternal grandmother; Heart attack in her father; Heart disease in her father, maternal uncle, maternal uncle, and paternal grandmother; High Cholesterol in her sister; High blood pressure  in her father, maternal grandmother, mother, paternal grandmother, and sister; Hyperlipidemia in  her mother; Hypertension in her father, maternal grandmother, mother, and paternal grandmother; Kidney disease in her maternal grandfather and paternal grandfather; Pancreatic cancer in her maternal aunt; Prostate cancer in her cousin; Stroke in her maternal grandmother, mother, and paternal aunt; Varicose Veins in her mother and sister. There is no history of Colon polyps, Colon cancer, Esophageal cancer, Rectal cancer, or Stomach cancer.  ROS:   Please see the history of present illness.    ROS  All other systems reviewed and negative.   EKGs/Labs/Other Studies Reviewed:    The following studies were reviewed today: none  Recent Labs: 11/20/2023: ALT 20; BUN 24; Creatinine 1.24; Hemoglobin 9.1; Platelets 193; Potassium 3.5; Sodium 137   Recent Lipid Panel    Component Value Date/Time   CHOL 151 03/08/2023 1038   TRIG 218 (H) 03/08/2023 1038   HDL 55 03/08/2023 1038   CHOLHDL 2.7 03/08/2023 1038   CHOLHDL 3 07/09/2021 0935   VLDL 27.0 07/09/2021 0935   LDLCALC 61 03/08/2023 1038   LDLCALC 55 03/09/2021 0914   LDLDIRECT 68.0 01/14/2020 0904    Physical Exam:    VS:  BP (!) 126/58 (BP Location: Left Arm, Patient Position: Sitting, Cuff Size: Normal)   Pulse 72   Resp 16   Ht 5\' 3"  (1.6 m)   Wt 139 lb 9.6 oz (63.3 kg)   SpO2 98%   BMI 24.73 kg/m     Wt Readings from Last 3 Encounters:  12/11/23 139 lb 9.6 oz (63.3 kg)  12/04/23 139 lb 12.8 oz (63.4 kg)  11/20/23 147 lb 12.8 oz (67 kg)    GEN: Well nourished, well developed in no acute distress HEENT: Normal NECK: No JVD; No carotid bruits LYMPHATICS: No lymphadenopathy CARDIAC:RRR, no murmurs, rubs, gallops RESPIRATORY:  Clear to auscultation without rales, wheezing or rhonchi  ABDOMEN: Soft, non-tender, non-distended MUSCULOSKELETAL:  No edema; No deformity  SKIN: Warm and dry NEUROLOGIC:  Alert and oriented x 3 PSYCHIATRIC:  Normal affect  ASSESSMENT:    1. Pulmonary HTN (HCC)   2. DOE (dyspnea on exertion)    3. 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 Hyperlipidemia LDL goal <70   5. Pulmonary hypertension, primary (HCC)   6. OSA (obstructive sleep apnea)   7. SVT (supraventricular tachycardia) (HCC)   8. Nonrheumatic tricuspid valve regurgitation      PLAN:    In order of problems listed above:  1.  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 -sleep study showed moderate OSA with AHI 18/hr and O2 sats as low as 72%>>now on CPAP followed by Pulmonary -echo 03/2022 showed normalization of PA pressures (RVSP ) with treatment of sleep apnea on CPAP -Repeat echo 05/11/2023 with EF 60 to 65%, normal RV size and function.  Echo read out as severe TR but unable to assess PA pressure due to eccentricity of TR jet.  I reviewed the images and felt the TR was only moderate.  -Cardiac cath 07/02/2023 showed nonobstructive CAD with normal filling pressures and no evidence of pulmonary hypertension  -She appears euvolemic on exam today -2D echo in January showed very mild pulmonary hypertension suspect likely related to noncompliance with CPAP therapy -continue CPAP therapy -Continue prescription management with Lasix  40 mg daily with as needed refills -I have personally reviewed and interpreted outside labs performed by patient's PCP which showed serum creatinine 1.24 potassium 3.5 on 11/20/2023    2. ASCAD -mild by cath in 2016 in [facility].   -Nuclear stress test 03/2020 for coronary artery calcifications noted on chest CT year was normal.   -coronary CTA 2023 showed coronary Ca score of 344 with 50-69% mid LAD, 25-49% proximal and distal LAD, <25% proximal to mid LCx and proximal to mid RCA -Cath 07/02/2023 showed 20% proximal to mid RCA, 30% mid left circumflex, 40% ostial  to mid LAD and 60% D1 with normal filling pressures -She has not had any anginal chest pain -Continue prescription drug management with aspirin 81 mg daily and Crestor 40 mg daily with as needed refills  3.  Hyperlipidemia  -LDL goal < 70 -Check FLP and ALT -Continue drug management with Crestor 40 mg daily with as needed refills  4.  Hypertension  -BP control on exam today -Continue prescription drug management with Cardizem CD 300 mg daily with as needed refills -I have personally reviewed and interpreted outside labs performed by patient's PCP which showed serum creatinine 1.24 potassium 3.5 on 11/20/2023  5.  OSA - The patient is tolerating PAP therapy well without any problems. The PAP download performed by his DME was personally reviewed and interpreted by me today and showed an AHI of 0.8 /hr on 9 cm H2O with 33% compliance in using more than 4 hours nightly.  The patient has been using and benefiting from PAP use and will continue to benefit from therapy.  -I encouraged her to be more compliant with her device  6. SVT/PVCs/PACs -Followed by EP -Event monitor 06/02/2023 showed SVT consistent with atrial tachycardia lasting as long as 1 minute at a time and some episodes of nonsustained VT lasting up to 7 beats. -her heart only races when she is doing any kind of exertional activity but not at rest.  She is very sedentary and I suspect that this is related to sedentary state and debilitation and not related to arrhythmia as it never occurs at rest -Continue drug management with Cardizem CD 300 mg daily with as needed refills  6. Tricuspid regurgitation -2D echo 11/30/2023 showed EF 65 to 70% with G1 DD normal RV function with mildly enlarged RV moderate-severe TR -We reviewed the findings of the echo -I have recommended setting up for TEE to get a better sense of why she has such significant TR -Informed Consent   Shared Decision Making/Informed Consent  The risks [esophageal damage,  perforation (1:10,000 risk), bleeding, pharyngeal hematoma as well as other potential complications associated with conscious sedation including aspiration, arrhythmia, respiratory failure and death], benefits (treatment guidance and diagnostic support) and alternatives of a transesophageal echocardiogram were discussed in detail with Ms. Hatfield and she is willing to proceed.      Followup with me in 1 year   Time Spent: 25 minutes total time of encounter, including 15 minutes spent in face-to-face patient care on the date of this encounter. This time includes coordination of care and counseling regarding above mentioned problem list. Remainder of non-face-to-face time involved reviewing chart documents/testing relevant to the patient encounter and documentation in the medical record. I have independently reviewed documentation from referring provider  Medication Adjustments/Labs and Tests Ordered: Current medicines are reviewed at length with the patient today.  Concerns regarding medicines are outlined above.  No orders of the defined types  were placed in this encounter.   No orders of the defined types were placed in this encounter.   Signed, Armanda Magic, MD  12/11/2023 11:07 AM    [facility]

## 2023-12-11 NOTE — Progress Notes (Signed)
 Cardiology Office Note:    Date:  12/11/2023   ID:  Madison Clements, DOB 05-31-1946, MRN [id number]  PCP:  Willow Ora, MD  Cardiologist:  Armanda Magic, MD    Referring MD: Willow Ora, MD   Chief Complaint  Patient presents with   Follow-up    Pulmonary hypertension, shortness of breath, CAD, hyperlipidemia, hypertension, obstructive sleep apnea, PVCs, atrial tachycardia, tricuspid regurgitation     History of Present Illness:    Madison Clements is a 78 y.o. female with a hx of  hyperlipidemia, SVT/PACs/PVCs followed by EP, GERD, ASCAD, hypertension and pulmonary hypertension.  She had an echocardiogram done and 2018 in [place] which showed normal LV function with the EF 55 to 60%, grade 2 diastolic dysfunction with mild to moderate TR and mild pulmonary hypertension with RVSP 39 mmHg.  She has been on Lasix 40 mg daily.  Cardiac catheterization done in 2016 showed mild CAD with calcified vessels.    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  2D echo done 04/05/2022 showed normal LV function with EF 60 to 65% and 1 diastolic dysfunction, mild to moderate TR and normal RVSP at 29 mmHg.   Her last echo done 05/11/2023 showed EF 60 to 65% and normal RV size and function but severe TR.  PA systolic pressure could not be estimated because of the eccentricity of the TR jet.  She had a home sleep study done showing moderate OSA with AHI 18/hr and O2 sats as low as 72%.  She became very frustrated with her CPAP and wanted to be evaluated for the inspire device and was referred to ENT and felt to be a good candidate for inspire and was scheduled for a sleep induced endoscopy.  Unfortunately she was diagnosed with breast cancer in the interim and the sleep induced endoscopy was postponed.  She underwent lumpectomy on 09/09/2021 s/p XRT.  She decided  to stay on CPAP for now.  She is here today for followup and is doing well.  She continues to have problems with chronic fatigue which she has had for several years. She denies any chest pain or pressure, SOB, DOE, PND, orthopnea, LE edema, dizziness or syncope. She has been having problems with racing of her heart beat after she has showered, emptied the dishwasher or any exertional activities. She is compliant with her meds and is tolerating meds with no SE.    She is doing well with her PAP device and thinks that she has gotten used to it.  She tolerates the mask and feels the pressure is adequate.  Since going on PAP she feels rested in the am and has no significant daytime sleepiness.  She denies any significant mouth or nasal dryness or nasal congestion.  She does not think that he snores.    Past Medical History:  Diagnosis Date   Allergy 1990   Morphine following surgery   Arthritis    generalized   Atypical mole 02/24/2015   RGHT LATERAL THIGH MODERATE   Atypical mole 01/20/2022   Left Breast (mild)   Atypical mole 01/20/2022   Mid Back (mild)   Atypical nevi 02/24/2015   RIGHT NECK MILD   Atypical nevi 08/25/2015   LEFT UPPER ARM MILD   Atypical nevi 08/25/2015   LEFT LATERAL FOREARM MILD/FREE   Atypical nevi 02/23/2016   LEFT  THIGH MODERATE/FREE   Atypical nevi 02/23/2016   LEFT MEDIAL LEG MODERATE/FREE   Atypical nevi 02/23/2016   LEFT UPPER BACK MILD /FREE   Atypical nevi 08/23/2016   RIGHT ANT PROX THIGH MILD /FREE   Atypical nevi 07/25/2018   MID LOWER BACK MODERATE   Atypical nevi 01/22/2019   LEFT MID BACK MILD /FREE   Atypical nevi 01/22/2019   LEFT NECK MILD   Atypical nevi 08/06/2019   LEFT INNER BREAST MODERATE W/S   Atypical nevi 08/06/2019   LEFT OUTER SIDE MILD/FREE   Blood transfusion without reported diagnosis    hx of   Breast cancer (HCC) 2022   CAD (coronary artery disease), native coronary artery 02/07/2019   Minimal CAD at the time of cath  in 2016 in [facility] [place]   Family history of breast cancer 08/19/2021   Family history of pancreatic cancer 08/19/2021   Fatty liver 05/21/2020   Gastric polyps 10/02/2018   EGD 03/2015; benign   GERD (gastroesophageal reflux disease) 10/02/2018   on meds   History of melanoma 10/02/2018   Abdomen 2012; 2013; s/p local excisions.    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 DISTAL LOWER LEG TREATED BY DR. Leda Quail   Sleep apnea    Sleep apnea 05/26/2020   uses CPAP   Tricuspid regurgitation    moderate by echo 04/2023    Past Surgical History:  Procedure Laterality Date   ABDOMINAL HYSTERECTOMY  1990   APPENDECTOMY  1962   BLADDER SUSPENSION  1990   BREAST LUMPECTOMY WITH RADIOACTIVE SEED LOCALIZATION Left 09/09/2021   Procedure: LEFT BREAST LUMPECTOMY WITH RADIOACTIVE SEED LOCALIZATION X2;  Surgeon: Manus Rudd, MD;  Location: [facility];  Service: General;  Laterality: Left;   COLONOSCOPY  2016   in [place]-hx of polyps   EYE SURGERY  2013 torn retina laser   MENISCUS REPAIR Right 2001   right knee   RIGHT HEART CATH AND CORONARY ANGIOGRAPHY N/A 06/20/2023   Procedure: RIGHT HEART CATH AND CORONARY ANGIOGRAPHY;  Surgeon: Laurey Morale, MD;  Location: [facility] INVASIVE CV LAB;  Service: Cardiovascular;  Laterality: N/A;   thumb surgery Left    TONSILLECTOMY AND ADENOIDECTOMY     tuabl ligation     TUBAL LIGATION  1975   WISDOM TOOTH EXTRACTION      Current Medications: Current Meds  Medication Sig   aspirin EC 81 MG tablet Take 1 tablet (81 mg total) by mouth daily.  Swallow whole.   benzonatate (TESSALON) 200 MG capsule Take 1 capsule (200 mg total) by mouth 2 (two) times daily as needed for cough.   diltiazem (CARDIZEM CD) 300 MG 24 hr capsule Take 1 capsule (300 mg total) by mouth daily.   furosemide (LASIX) 40 MG tablet Take 40 mg by mouth daily.   lisinopril (ZESTRIL) 10 MG tablet Take 10 mg by mouth daily.   omeprazole (PRILOSEC) 20 MG capsule Take 20 mg by mouth daily.   Prenatal Vit-Fe Fumarate-FA (PRENATAL VITAMIN PO) Take by mouth daily. Prenatal w/folic  acid po daily   Probiotic Product (ADVANCED PROBIOTIC PO) Take 1 capsule by mouth daily.   promethazine-dextromethorphan (PROMETHAZINE-DM) 6.25-15 MG/5ML syrup Take 5 mLs by mouth 4 (four) times daily as needed for cough.   rosuvastatin (CRESTOR) 40 MG tablet TAKE 1 TABLET (40 MG TOTAL) BY MOUTH AT BEDTIME.     Allergies:   Morphine, Metoprolol, Nebivolol, and Morphine and codeine   Social History   Socioeconomic History   Marital status: Widowed    Spouse name: Not on file   Number of children: 1   Years of education: Not on file   Highest education level: Some college, no degree  Occupational History   Occupation: Retired [employer]  Tobacco Use   Smoking status: Former    Current packs/day: 0.00    Average packs/day: 0.3 packs/day for 10.0 years (2.5 ttl pk-yrs)    Types: Cigarettes    Start date: 09/12/1986    Quit date: 09/12/1996    Years since quitting: 27.2   Smokeless tobacco: Never  Vaping Use   Vaping status: Never Used  Substance and Sexual Activity   Alcohol use: Not Currently    Comment: occassional    Drug use: Never   Sexual activity: Not Currently    Birth control/protection: Abstinence, Post-menopausal    Comment: hysterectomy  Other Topics Concern   Not on file  Social History Narrative   Widowed 2018 after 40 years of marriage   One daughter, 2 grandchildren. Lives in [place]   Sister lives in same complex in [place]    Previously lived in [place], [place]    Social Drivers of Health   Financial Resource Strain: Low Risk  (12/04/2023)   Overall Financial Resource Strain (CARDIA)    Difficulty of Paying Living Expenses: Not hard at all  Food Insecurity: No Food Insecurity (12/04/2023)   Hunger Vital Sign    Worried About Running Out of Food in the Last Year: Never true    Ran Out of Food in the Last Year: Never true  Transportation Needs: No Transportation Needs (12/04/2023)   PRAPARE - Administrator, Civil Service (Medical): No    Lack of Transportation (Non-Medical): No  Physical Activity: Insufficiently Active (12/04/2023)   Exercise Vital Sign    Days of Exercise per Week: 3 days    Minutes of Exercise per Session: 20 min  Stress: No Stress Concern Present (12/04/2023)   [employer] of Occupational Health - Occupational Stress Questionnaire    Feeling of Stress : Not at all  Social Connections: Moderately Integrated (12/04/2023)   Social Connection and Isolation Panel [NHANES]    Frequency of Communication with Friends and Family: More than three times a week    Frequency of Social Gatherings with Friends and Family: More than three times a week    Attends Religious Services: More than 4 times per year    Active Member of [employer] or Organizations: Yes    Attends Banker Meetings: More than 4 times per year    Marital Status: Widowed     Family History: The patient's family history includes Arthritis in her mother, paternal grandmother, and sister; Asthma in her maternal grandfather and paternal grandfather; Breast cancer (age of onset: 72) in her sister; COPD in her paternal grandfather; Diabetes in her sister; Early death in her maternal grandmother; Heart attack in her father; Heart disease in her father, maternal uncle, maternal uncle, and paternal grandmother; High Cholesterol in her sister; High blood pressure  in her father, maternal grandmother, mother, paternal grandmother, and sister; Hyperlipidemia in  her mother; Hypertension in her father, maternal grandmother, mother, and paternal grandmother; Kidney disease in her maternal grandfather and paternal grandfather; Pancreatic cancer in her maternal aunt; Prostate cancer in her cousin; Stroke in her maternal grandmother, mother, and paternal aunt; Varicose Veins in her mother and sister. There is no history of Colon polyps, Colon cancer, Esophageal cancer, Rectal cancer, or Stomach cancer.  ROS:   Please see the history of present illness.    ROS  All other systems reviewed and negative.   EKGs/Labs/Other Studies Reviewed:    The following studies were reviewed today: none  Recent Labs: 11/20/2023: ALT 20; BUN 24; Creatinine 1.24; Hemoglobin 9.1; Platelets 193; Potassium 3.5; Sodium 137   Recent Lipid Panel    Component Value Date/Time   CHOL 151 03/08/2023 1038   TRIG 218 (H) 03/08/2023 1038   HDL 55 03/08/2023 1038   CHOLHDL 2.7 03/08/2023 1038   CHOLHDL 3 07/09/2021 0935   VLDL 27.0 07/09/2021 0935   LDLCALC 61 03/08/2023 1038   LDLCALC 55 03/09/2021 0914   LDLDIRECT 68.0 01/14/2020 0904    Physical Exam:    VS:  BP (!) 126/58 (BP Location: Left Arm, Patient Position: Sitting, Cuff Size: Normal)   Pulse 72   Resp 16   Ht 5\' 3"  (1.6 m)   Wt 139 lb 9.6 oz (63.3 kg)   SpO2 98%   BMI 24.73 kg/m     Wt Readings from Last 3 Encounters:  12/11/23 139 lb 9.6 oz (63.3 kg)  12/04/23 139 lb 12.8 oz (63.4 kg)  11/20/23 147 lb 12.8 oz (67 kg)    GEN: Well nourished, well developed in no acute distress HEENT: Normal NECK: No JVD; No carotid bruits LYMPHATICS: No lymphadenopathy CARDIAC:RRR, no murmurs, rubs, gallops RESPIRATORY:  Clear to auscultation without rales, wheezing or rhonchi  ABDOMEN: Soft, non-tender, non-distended MUSCULOSKELETAL:  No edema; No deformity  SKIN: Warm and dry NEUROLOGIC:  Alert and oriented x 3 PSYCHIATRIC:  Normal affect  ASSESSMENT:    1. Pulmonary HTN (HCC)   2. DOE (dyspnea on exertion)    3. 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 Hyperlipidemia LDL goal <70   5. Pulmonary hypertension, primary (HCC)   6. OSA (obstructive sleep apnea)   7. SVT (supraventricular tachycardia) (HCC)   8. Nonrheumatic tricuspid valve regurgitation      PLAN:    In order of problems listed above:  1.  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 -sleep study showed moderate OSA with AHI 18/hr and O2 sats as low as 72%>>now on CPAP followed by Pulmonary -echo 03/2022 showed normalization of PA pressures (RVSP ) with treatment of sleep apnea on CPAP -Repeat echo 05/11/2023 with EF 60 to 65%, normal RV size and function.  Echo read out as severe TR but unable to assess PA pressure due to eccentricity of TR jet.  I reviewed the images and felt the TR was only moderate.  -Cardiac cath 07/02/2023 showed nonobstructive CAD with normal filling pressures and no evidence of pulmonary hypertension  -She appears euvolemic on exam today -2D echo in January showed very mild pulmonary hypertension suspect likely related to noncompliance with CPAP therapy -continue CPAP therapy -Continue prescription management with Lasix  40 mg daily with as needed refills -I have personally reviewed and interpreted outside labs performed by patient's PCP which showed serum creatinine 1.24 potassium 3.5 on 11/20/2023    2. ASCAD -mild by cath in 2016 in [facility].   -Nuclear stress test 03/2020 for coronary artery calcifications noted on chest CT year was normal.   -coronary CTA 2023 showed coronary Ca score of 344 with 50-69% mid LAD, 25-49% proximal and distal LAD, <25% proximal to mid LCx and proximal to mid RCA -Cath 07/02/2023 showed 20% proximal to mid RCA, 30% mid left circumflex, 40% ostial  to mid LAD and 60% D1 with normal filling pressures -She has not had any anginal chest pain -Continue prescription drug management with aspirin 81 mg daily and Crestor 40 mg daily with as needed refills  3.  Hyperlipidemia  -LDL goal < 70 -Check FLP and ALT -Continue drug management with Crestor 40 mg daily with as needed refills  4.  Hypertension  -BP control on exam today -Continue prescription drug management with Cardizem CD 300 mg daily with as needed refills -I have personally reviewed and interpreted outside labs performed by patient's PCP which showed serum creatinine 1.24 potassium 3.5 on 11/20/2023  5.  OSA - The patient is tolerating PAP therapy well without any problems. The PAP download performed by his DME was personally reviewed and interpreted by me today and showed an AHI of 0.8 /hr on 9 cm H2O with 33% compliance in using more than 4 hours nightly.  The patient has been using and benefiting from PAP use and will continue to benefit from therapy.  -I encouraged her to be more compliant with her device  6. SVT/PVCs/PACs -Followed by EP -Event monitor 06/02/2023 showed SVT consistent with atrial tachycardia lasting as long as 1 minute at a time and some episodes of nonsustained VT lasting up to 7 beats. -her heart only races when she is doing any kind of exertional activity but not at rest.  She is very sedentary and I suspect that this is related to sedentary state and debilitation and not related to arrhythmia as it never occurs at rest -Continue drug management with Cardizem CD 300 mg daily with as needed refills  6. Tricuspid regurgitation -2D echo 11/30/2023 showed EF 65 to 70% with G1 DD normal RV function with mildly enlarged RV moderate-severe TR -We reviewed the findings of the echo -I have recommended setting up for TEE to get a better sense of why she has such significant TR -Informed Consent   Shared Decision Making/Informed Consent  The risks [esophageal damage,  perforation (1:10,000 risk), bleeding, pharyngeal hematoma as well as other potential complications associated with conscious sedation including aspiration, arrhythmia, respiratory failure and death], benefits (treatment guidance and diagnostic support) and alternatives of a transesophageal echocardiogram were discussed in detail with Madison Clements and she is willing to proceed.      Followup with me in 1 year   Time Spent: 25 minutes total time of encounter, including 15 minutes spent in face-to-face patient care on the date of this encounter. This time includes coordination of care and counseling regarding above mentioned problem list. Remainder of non-face-to-face time involved reviewing chart documents/testing relevant to the patient encounter and documentation in the medical record. I have independently reviewed documentation from referring provider  Medication Adjustments/Labs and Tests Ordered: Current medicines are reviewed at length with the patient today.  Concerns regarding medicines are outlined above.  No orders of the defined types  were placed in this encounter.   No orders of the defined types were placed in this encounter.   Signed, Armanda Magic, MD  12/11/2023 11:07 AM    [facility]

## 2023-12-11 NOTE — Addendum Note (Signed)
 Addended by: Virl Axe, Kariah Loredo L on: 12/11/2023 11:38 AM   Modules accepted: Orders

## 2023-12-11 NOTE — Patient Instructions (Signed)
 Dear Madison Clements  You are scheduled for a TEE (Transesophageal Echocardiogram) on Thursday, April 24 with Dr. Izora Ribas.  Please arrive at the [facility] (Main Entrance A) at [facility]: [address] [place], [place] [address] at 6:30 AM (This time is 1 hour(s) before your procedure to ensure your preparation).   Free valet parking service is available. You will check in at ADMITTING.   *Please Note: You will receive a call the day before your procedure to confirm the appointment time. That time may have changed from the original time based on the schedule for that day.*    DIET:  Nothing to eat or drink after midnight except a sip of water with medications (see medication instructions below)  MEDICATION INSTRUCTIONS: !!IF ANY NEW MEDICATIONS ARE STARTED AFTER TODAY, PLEASE NOTIFY YOUR PROVIDER AS SOON AS POSSIBLE!!  FYI: Medications such as Semaglutide (Ozempic, [place]), Tirzepatide (Mounjaro, Zepbound), Dulaglutide (Trulicity), etc ("GLP1 agonists") AND Canagliflozin (Invokana), Dapagliflozin (Farxiga), Empagliflozin (Jardiance), Ertugliflozin (Steglatro), Bexagliflozin [employer]) or any combination with one of these drugs such as Invokamet (Canagliflozin/Metformin), Synjardy (Empagliflozin/Metformin), etc ("SGLT2 inhibitors") must be held around the time of a procedure. This is not a comprehensive list of all of these drugs. Please review all of your medications and talk to your provider if you take any one of these. If you are not sure, ask your provider.   LABS: Go to any [facility] to get [employer]  and CBC  FYI:  For your safety, and to allow [place] to monitor your vital signs accurately during the surgery/procedure we request: If you have artificial nails, gel coating, SNS etc, please have those removed prior to your surgery/procedure. Not having the nail coverings /polish removed may result in cancellation or delay of your surgery/procedure.  Your support person will  be asked to wait in the waiting room during your procedure.  It is OK to have someone drop you off and come back when you are ready to be discharged.  You cannot drive after the procedure and will need someone to drive you home.  Bring your insurance cards.  *Special Note: Every effort is made to have your procedure done on time. Occasionally there are emergencies that occur at the hospital that may cause delays. Please be patient if a delay does occur.    Medication Instructions:  Your physician recommends that you continue on your current medications as directed. Please refer to the Current Medication list given to you today.  *If you need a refill on your cardiac medications before your next appointment, please call your pharmacy*  Lab Work: Fasting lipid panel, ALT If you have labs (blood work) drawn today and your tests are completely normal, you will receive your results only by: MyChart Message (if you have MyChart) OR A paper copy in the mail If you have any lab test that is abnormal or we need to change your treatment, we will call you to review the results.  Testing/Procedures: TEE  Follow-Up: At [facility], you and your health needs are our priority.  As part of our continuing mission to provide you with exceptional heart care, our providers are all part of one team.  This team includes your primary Cardiologist (physician) and Advanced Practice Providers or APPs (Physician Assistants and Nurse Practitioners) who all work together to provide you with the care you need, when you need it.  Your next appointment:   1 year(s)  Provider:   Armanda Magic, MD  We recommend signing up for the patient portal called "MyChart".  Sign up information is provided on this After Visit Summary.  MyChart is used to connect with patients for Virtual Visits (Telemedicine).  Patients are able to view lab/test results, encounter notes, upcoming appointments, etc.  Non-urgent messages  can be sent to your provider as well.   To learn more about what you can do with MyChart, go to [email or web address].   Other Instructions       1st Floor: - Lobby - Registration  - Pharmacy  - Lab - Cafe  2nd Floor: - PV Lab - Diagnostic Testing (echo, CT, nuclear med)  3rd Floor: - Vacant  4th Floor: - TCTS (cardiothoracic surgery) - AFib Clinic - Structural Heart Clinic - Vascular Surgery  - Vascular Ultrasound  5th Floor: - HeartCare Cardiology (general and EP) - Clinical Pharmacy for coumadin, hypertension, lipid, weight-loss medications, and med management appointments    Valet parking services will be available as well.

## 2023-12-11 NOTE — Addendum Note (Signed)
 Addended by: Erick Alley on: 12/11/2023 11:23 AM   Modules accepted: Orders

## 2023-12-12 DIAGNOSIS — I2510 Atherosclerotic heart disease of native coronary artery without angina pectoris: Secondary | ICD-10-CM | POA: Diagnosis not present

## 2023-12-12 DIAGNOSIS — G4733 Obstructive sleep apnea (adult) (pediatric): Secondary | ICD-10-CM | POA: Diagnosis not present

## 2023-12-12 DIAGNOSIS — I361 Nonrheumatic tricuspid (valve) insufficiency: Secondary | ICD-10-CM | POA: Diagnosis not present

## 2023-12-12 DIAGNOSIS — I272 Pulmonary hypertension, unspecified: Secondary | ICD-10-CM | POA: Diagnosis not present

## 2023-12-12 DIAGNOSIS — I471 Supraventricular tachycardia, unspecified: Secondary | ICD-10-CM | POA: Diagnosis not present

## 2023-12-12 DIAGNOSIS — I270 Primary pulmonary hypertension: Secondary | ICD-10-CM | POA: Diagnosis not present

## 2023-12-12 DIAGNOSIS — R0609 Other forms of dyspnea: Secondary | ICD-10-CM | POA: Diagnosis not present

## 2023-12-12 DIAGNOSIS — E785 Hyperlipidemia, unspecified: Secondary | ICD-10-CM | POA: Diagnosis not present

## 2023-12-12 LAB — CBC
Hematocrit: 27.6 % — ABNORMAL LOW (ref 34.0–46.6)
Hemoglobin: 9.1 g/dL — ABNORMAL LOW (ref 11.1–15.9)
MCH: 30.2 pg (ref 26.6–33.0)
MCHC: 33 g/dL (ref 31.5–35.7)
MCV: 92 fL (ref 79–97)
NRBC: 1 % — ABNORMAL HIGH (ref 0–0)
Platelets: 316 10*3/uL (ref 150–450)
RBC: 3.01 x10E6/uL — ABNORMAL LOW (ref 3.77–5.28)
RDW: 16.1 % — ABNORMAL HIGH (ref 11.7–15.4)
WBC: 8.5 10*3/uL (ref 3.4–10.8)

## 2023-12-13 ENCOUNTER — Telehealth: Payer: Self-pay

## 2023-12-13 DIAGNOSIS — E785 Hyperlipidemia, unspecified: Secondary | ICD-10-CM

## 2023-12-13 DIAGNOSIS — Z79899 Other long term (current) drug therapy: Secondary | ICD-10-CM

## 2023-12-13 DIAGNOSIS — I10 Essential (primary) hypertension: Secondary | ICD-10-CM

## 2023-12-13 LAB — BASIC METABOLIC PANEL WITH GFR
BUN/Creatinine Ratio: 9 — ABNORMAL LOW (ref 12–28)
BUN: 24 mg/dL (ref 8–27)
CO2: 17 mmol/L — ABNORMAL LOW (ref 20–29)
Calcium: 9.5 mg/dL (ref 8.7–10.3)
Chloride: 100 mmol/L (ref 96–106)
Creatinine, Ser: 2.74 mg/dL — ABNORMAL HIGH (ref 0.57–1.00)
Glucose: 116 mg/dL — ABNORMAL HIGH (ref 70–99)
Potassium: 3.8 mmol/L (ref 3.5–5.2)
Sodium: 137 mmol/L (ref 134–144)
eGFR: 17 mL/min/{1.73_m2} — ABNORMAL LOW (ref >59)

## 2023-12-13 LAB — LIPID PANEL
Chol/HDL Ratio: 3.2 ratio (ref 0.0–4.4)
Cholesterol, Total: 123 mg/dL (ref 100–199)
HDL: 39 mg/dL — ABNORMAL LOW (ref >39)
LDL Chol Calc (NIH): 58 mg/dL (ref 0–99)
Triglycerides: 152 mg/dL — ABNORMAL HIGH (ref 0–149)
VLDL Cholesterol Cal: 26 mg/dL (ref 5–40)

## 2023-12-13 LAB — ALT: ALT: 54 IU/L — ABNORMAL HIGH (ref 0–32)

## 2023-12-13 MED ORDER — ROSUVASTATIN CALCIUM 20 MG PO TABS: 20.0000 mg | ORAL_TABLET | Freq: Every day | ORAL | 3 refills | Status: DC

## 2023-12-13 MED ORDER — FUROSEMIDE 20 MG PO TABS: 20.0000 mg | ORAL_TABLET | Freq: Every day | ORAL | 3 refills | Status: DC

## 2023-12-13 MED ORDER — EZETIMIBE 10 MG PO TABS: 10.0000 mg | ORAL_TABLET | Freq: Every day | ORAL | 3 refills | Status: DC

## 2023-12-13 NOTE — Telephone Encounter (Signed)
-----   Message from Armanda Magic sent at 12/13/2023 11:31 AM EDT ----- Hemoglobin is lower than what it was 3 months ago at 9.1 today and was 11.6.  ALT is elevated today compared in the past.  LDL is at goal.  Decrease Crestor back to 20 mg daily and add Zetia 10 mg daily with repeat FLP and ALT in 6 weeks  Please forward a copy of these labs to her PCP

## 2023-12-13 NOTE — Telephone Encounter (Signed)
 Spoke with pt regarding her results. Crestor was decreased to 20 mg daily and Zetia 10 mg daily was added. FLP and ALT were ordered  to be done in 6 weeks and released. Lab results were sent to pt's PCP per Dr. Mayford Knife and oncologist Dr. Mosetta Putt as requested by patient. Pt verbalized understanding. All questions, if any, were answered.

## 2023-12-13 NOTE — Telephone Encounter (Signed)
 This encounter was created in error - please disregard.

## 2023-12-13 NOTE — Telephone Encounter (Signed)
-----   Message from Nurse Vena Austria sent at 12/13/2023  2:22 PM EDT -----  ----- Message ----- From: Quintella Reichert, MD Sent: 12/13/2023  11:33 AM EDT To: [facility] Triage  Serum creatinine is bumped from 1.24->>2.74.  I would like her to hold her Lasix for 3 days and then decrease to 20 mg daily.  Please find out if she is still taking lisinopril and if she is she needs to stop the lisinopril.  Please get a BMET on Friday 4/4

## 2023-12-13 NOTE — Telephone Encounter (Signed)
 Spoke with pt regarding lab results. Pt told to hold Lasix for 3 days and then decrease to 20 mg daily. A new prescription was ordered. Pt stated that she is taking lisinopril. Pt told to stop taking lisinopril for now. Pt aware to have BMET drawn 4/4. BMET ordered and released. Pt verbalized understanding. All questions, if any, were answered.

## 2023-12-14 NOTE — Telephone Encounter (Signed)
 Reviewed labs: anemic with normal b12 levels.  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  Acute on chronic KD: cards is adjusting lasix and holding lisinopril. Is repeating blood work next week.  Elevated LFT: adjusting down dose of statin.   Will f/u after next lab results.

## 2023-12-15 DIAGNOSIS — Z79899 Other long term (current) drug therapy: Secondary | ICD-10-CM | POA: Diagnosis not present

## 2023-12-15 DIAGNOSIS — E785 Hyperlipidemia, unspecified: Secondary | ICD-10-CM | POA: Diagnosis not present

## 2023-12-15 DIAGNOSIS — I10 Essential (primary) hypertension: Secondary | ICD-10-CM | POA: Diagnosis not present

## 2023-12-16 ENCOUNTER — Encounter: Payer: Self-pay | Admitting: Family Medicine

## 2023-12-16 DIAGNOSIS — J189 Pneumonia, unspecified organism: Secondary | ICD-10-CM

## 2023-12-16 DIAGNOSIS — I2510 Atherosclerotic heart disease of native coronary artery without angina pectoris: Secondary | ICD-10-CM

## 2023-12-16 DIAGNOSIS — I270 Primary pulmonary hypertension: Secondary | ICD-10-CM

## 2023-12-16 DIAGNOSIS — C50812 Malignant neoplasm of overlapping sites of left female breast: Secondary | ICD-10-CM

## 2023-12-16 DIAGNOSIS — J90 Pleural effusion, not elsewhere classified: Secondary | ICD-10-CM

## 2023-12-16 LAB — LIPID PANEL
Chol/HDL Ratio: 3.7 ratio (ref 0.0–4.4)
Cholesterol, Total: 133 mg/dL (ref 100–199)
HDL: 36 mg/dL — ABNORMAL LOW (ref >39)
LDL Chol Calc (NIH): 62 mg/dL (ref 0–99)
Triglycerides: 211 mg/dL — ABNORMAL HIGH (ref 0–149)
VLDL Cholesterol Cal: 35 mg/dL (ref 5–40)

## 2023-12-16 LAB — BASIC METABOLIC PANEL WITH GFR
BUN/Creatinine Ratio: 13 (ref 12–28)
BUN: 26 mg/dL (ref 8–27)
CO2: 17 mmol/L — ABNORMAL LOW (ref 20–29)
Calcium: 9 mg/dL (ref 8.7–10.3)
Chloride: 101 mmol/L (ref 96–106)
Creatinine, Ser: 2.08 mg/dL — ABNORMAL HIGH (ref 0.57–1.00)
Glucose: 107 mg/dL — ABNORMAL HIGH (ref 70–99)
Potassium: 4.4 mmol/L (ref 3.5–5.2)
Sodium: 137 mmol/L (ref 134–144)
eGFR: 24 mL/min/{1.73_m2} — ABNORMAL LOW (ref >59)

## 2023-12-16 LAB — ALT: ALT: 43 IU/L — ABNORMAL HIGH (ref 0–32)

## 2023-12-16 NOTE — Progress Notes (Signed)
 Chest xray showing left pneumonia and pleural effusion.  Will repeat in 6-8 weeks.  Dr. Mosetta Putt, please let me know if you'd prefer a chest CT now or further evaluation of the effusion now; her symptoms were consistent with pneumonia.  Thanks.

## 2023-12-18 ENCOUNTER — Ambulatory Visit

## 2023-12-18 ENCOUNTER — Telehealth: Payer: Self-pay

## 2023-12-18 DIAGNOSIS — Z79899 Other long term (current) drug therapy: Secondary | ICD-10-CM

## 2023-12-18 NOTE — Telephone Encounter (Signed)
 Spoke with pt regarding her results. Pt was told to hold Lisinopril and Lasix for now and to come in to [facility] for a BMET in one week. Pt verbalized understanding. All questions, if any, were answered. A BMET was ordered and released.

## 2023-12-18 NOTE — Telephone Encounter (Signed)
-----   Message from Nurse Zelphia Cairo sent at 12/18/2023  8:13 AM EDT -----  ----- Message ----- From: Quintella Reichert, MD Sent: 12/17/2023   8:39 AM EDT To: Willow Ora, MD; Cv Div Ch St Triage  SCR has improved some but still elevated - continue to hold Lisinopril and hold Lasix.  Repeat BMET in 1 week

## 2023-12-20 ENCOUNTER — Encounter: Payer: Self-pay | Admitting: Cardiology

## 2023-12-22 ENCOUNTER — Ambulatory Visit: Admitting: Internal Medicine

## 2023-12-22 ENCOUNTER — Encounter: Payer: Self-pay | Admitting: Internal Medicine

## 2023-12-22 VITALS — BP 134/58 | HR 77 | Temp 98.1°F | Ht 62.0 in | Wt 141.2 lb

## 2023-12-22 DIAGNOSIS — J90 Pleural effusion, not elsewhere classified: Secondary | ICD-10-CM

## 2023-12-22 DIAGNOSIS — Z862 Personal history of diseases of the blood and blood-forming organs and certain disorders involving the immune mechanism: Secondary | ICD-10-CM | POA: Diagnosis not present

## 2023-12-22 DIAGNOSIS — Z87891 Personal history of nicotine dependence: Secondary | ICD-10-CM

## 2023-12-22 DIAGNOSIS — I361 Nonrheumatic tricuspid (valve) insufficiency: Secondary | ICD-10-CM | POA: Diagnosis not present

## 2023-12-22 DIAGNOSIS — Z923 Personal history of irradiation: Secondary | ICD-10-CM | POA: Diagnosis not present

## 2023-12-22 DIAGNOSIS — R053 Chronic cough: Secondary | ICD-10-CM | POA: Diagnosis not present

## 2023-12-22 DIAGNOSIS — Z853 Personal history of malignant neoplasm of breast: Secondary | ICD-10-CM

## 2023-12-22 DIAGNOSIS — Z8669 Personal history of other diseases of the nervous system and sense organs: Secondary | ICD-10-CM | POA: Diagnosis not present

## 2023-12-22 DIAGNOSIS — R634 Abnormal weight loss: Secondary | ICD-10-CM | POA: Diagnosis not present

## 2023-12-22 DIAGNOSIS — R7989 Other specified abnormal findings of blood chemistry: Secondary | ICD-10-CM | POA: Diagnosis not present

## 2023-12-22 LAB — PROTIME-INR
INR: 1.4 ratio — ABNORMAL HIGH (ref 0.8–1.0)
Prothrombin Time: 14.6 s — ABNORMAL HIGH (ref 9.6–13.1)

## 2023-12-22 LAB — CBC WITH DIFFERENTIAL/PLATELET
Basophils Absolute: 0 10*3/uL (ref 0.0–0.1)
Basophils Relative: 0.5 % (ref 0.0–3.0)
Eosinophils Absolute: 0 10*3/uL (ref 0.0–0.7)
Eosinophils Relative: 0 % (ref 0.0–5.0)
HCT: 24.2 % — ABNORMAL LOW (ref 36.0–46.0)
Hemoglobin: 8.2 g/dL — ABNORMAL LOW (ref 12.0–15.0)
Lymphocytes Relative: 22.9 % (ref 12.0–46.0)
Lymphs Abs: 1.4 10*3/uL (ref 0.7–4.0)
MCHC: 34 g/dL (ref 30.0–36.0)
MCV: 91.4 fl (ref 78.0–100.0)
Monocytes Absolute: 0.5 10*3/uL (ref 0.1–1.0)
Monocytes Relative: 8.6 % (ref 3.0–12.0)
Neutro Abs: 4.2 10*3/uL (ref 1.4–7.7)
Neutrophils Relative %: 68 % (ref 43.0–77.0)
Platelets: 229 10*3/uL (ref 150.0–400.0)
RBC: 2.65 Mil/uL — ABNORMAL LOW (ref 3.87–5.11)
RDW: 17.8 % — ABNORMAL HIGH (ref 11.5–15.5)
WBC: 6.2 10*3/uL (ref 4.0–10.5)

## 2023-12-22 LAB — HEPATIC FUNCTION PANEL
ALT: 30 U/L (ref 0–35)
AST: 44 U/L — ABNORMAL HIGH (ref 0–37)
Albumin: 4 g/dL (ref 3.5–5.2)
Alkaline Phosphatase: 116 U/L (ref 39–117)
Bilirubin, Direct: 0.1 mg/dL (ref 0.0–0.3)
Total Bilirubin: 0.7 mg/dL (ref 0.2–1.2)
Total Protein: 7 g/dL (ref 6.0–8.3)

## 2023-12-22 LAB — SEDIMENTATION RATE: Sed Rate: 44 mm/h — ABNORMAL HIGH (ref 0–30)

## 2023-12-22 LAB — TSH: TSH: 2.23 u[IU]/mL (ref 0.35–5.50)

## 2023-12-22 NOTE — Progress Notes (Signed)
 IOV 2022 Chief complaint: Follow-up for pulmonary hypertension  HPI: 78 year old with history of pulmonary hypertension, hyperlipidemia, skin cancer Referred for evaluation of pulmonary hypertension  This was initially diagnosed in 2010 in [place].  Previous echocardiogram with RVSP in the 30-40 range.  She was evaluated by pulmonologist in 2014 with normal PFTs and was told that she had mild OSA on sleep study that did not require treatment. More recently she has been having increasing dyspnea on exertion, lower extremity edema.  She has osteoarthritis.  Denies morning stiffness, difficulty swallowing, Raynaud's syndrome.  Recent echocardiogram with elevation in RVSP and has been referred to pulmonary for further evaluation.  She is also being evaluated for petechial rash by primary care  She follows with Dr. Mayford Knife from cardiology Seen by Dr. Marina Goodell, GI in September 2021 with work-up for elevated transaminases with ultrasound showing hepatic steatosis Work-up for alpha-1 antitrypsin, hepatitis, autoimmune hepatitis is negative.  Pets: No pets Occupation: Retired Associate Professor for [employer] Exposures: No known exposures.  No mold, hot tub, Jacuzzi.  No feather pillows or comforters Smoking history: 5-pack-year smoker.  Quit in 1998 Travel history: Previously lived in [place], [place].  No significant recent travel Relevant family history: No significant family history of lung disease  Interim history: Started on CPAP in November 2021 for diagnosis of moderate sleep apnea. She is using it every day but sometimes not more than 4 hours.  Has issues with mask fit and sleep quality while on CPAP. PFTs: 06/04/2020 FVC 2.58 [106%], FEV1 2.07 [115%], F/F 80, TLC 5.14 [111%], DLCO 14.57 [84%] Normal test  Labs: CMP from 01/14/2020-significant for slightly elevated transaminases and total bilirubin. 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  She is not comfortable with the mask and will get a mask fitting with DME.  If there are persistent issues then consider dental device. Discussed with Dr. Mayford Knife, cardiology.  We will repeat echocardiogram after 3 to 6 months of adequate therapy. If pulmonary hypertension is persistent then get right heart cath  Plan/Recommendations: Continue CPAP at 9 cm Mask fitting  OV 12/22/2023  Subjective:  Patient ID: Madison Clements, female , DOB: 05-15-1946 , age 78 y.o. , MRN: [id number] , ADDRESS: [address] [place] [address] PCP Willow Ora, MD Patient Care Team: Willow Ora, MD as PCP - General (Family Medicine) Quintella Reichert, MD as PCP - Cardiology (Cardiology) Quintella Reichert, MD as Consulting Physician (Cardiology) Sherrie George, MD as Consulting Physician (Ophthalmology) Glyn Ade, PA-C as Physician Assistant (Dermatology) Manus Rudd, MD as Consulting Physician (General Surgery) Malachy Mood, MD as Consulting Physician (Hematology) Lonie Peak, MD as Attending Physician (Radiation Oncology) Erroll Luna, [facility] (Inactive) (Pharmacist)  This Provider for this visit: Treatment Team:  Attending Provider: Chilton Greathouse, MD    12/22/2023 -   Chief Complaint  Patient presents with   Pulmonary Consult    Referred by Dr. Asencion Partridge for eval of recent  PNA and left pleural effusion.  She has had persistent cough since  Feb 2025. Cough feels like it's productive but it's not. She has DOE with walking short distances such as parking lot to our lobby today.      HPI Madison Clements 78 y.o. -new consult for left pleural effusion referred by Dr. Carolanne Grumbling  Over 3 years ago she saw Dr. Dessie Coma for potential pulmonary hypertension evaluation which she states was because of her sleep apnea and since using CPAP it is no longer present in the recent echocardiograms.  She is here with the sister.  She lives in the [place] area.  She says she is doing well except for the fact that routine echocardiogram showed severe tricuspid regurgitation and she is having a TEE next week.  She has baseline dyspnea on exertion.  For the current issues pleural effusion left side  It appears that mid February 2025 she had flulike symptoms possibly 1 classic flu though never tested with chills fever body ache.  Was a self-limiting illness.  In she was left with a residual cough however.  While this cough persisted then by mid-to-late February 2025 she had symptoms again of fever and chills and this time on December 05, 2023 she had a chest x-ray that I personally visualized and showed a small pleural effusion.  She was then given Z-Pak which then resolved her fever but she states she still coughs.  Currently ongoing cough for over 5 weeks or 2 months.  Is a dry cough.  She says is a lot of postnasal drainage and she clears her throat there is no wheezing.  During all this time she has got up unintentional weight loss of 13 pounds.  Other pleural effusion related history - Baseline creatinine around 1 mg percent but starting December 12, 2023 with supper 2.74 mg percent -Echo findings of tricuspid regurgitation that is severe in March 2025 -No prior TSH -No prior QuantiFERON gold - Last BNP in 2021 -She has a history of left-sided breast cancer with status post radiation   [place] thora at bedside in office 12/22/2023  -There is  definitely a small to moderate left-sided pleural effusion that is free-flowing.  On the right side there is no effusion.  ECHO March 2025 per Dr Mayford Knife  Echo showed normal pumping function of the heart muscle with increased stiffness of the heart called diastolic dysfunction normal for age.  The right ventricle is mildly enlarged with mild pulmonary hypertension.  There is moderate to severe tricuspid regurgitation.  Cath 07/02/2023 showed no evidence of pulmonary hypertension.  I would like her to be seen by an extender to get set up for TEE to assess degree of tricuspid regurgitation as it is read out is very eccentric and may be very sever      PFT     Latest Ref Rng & Units 06/04/2020    9:53 AM  PFT Results  FVC-Pre L 2.58   FVC-Predicted Pre % 106   FVC-Post L 2.51   FVC-Predicted Post % 104   Pre FEV1/FVC % % 80   Post FEV1/FCV % % 86   FEV1-Pre L 2.07   FEV1-Predicted Pre % 115   FEV1-Post L 2.17   DLCO uncorrected ml/min/mmHg 14.57   DLCO UNC% % 84   DLCO corrected ml/min/mmHg 14.57   DLCO COR %Predicted % 84   DLVA Predicted % 78   TLC L 5.14   TLC % Predicted % 111  RV % Predicted % 116        SIT STAND TEST - goal 15 times   12/22/2023    O2 used ra   PRobe - finter or forehead finger   Number sit and stand completed - goal 15 15   Time taken to complete 55 seconds   Resting Pulse Ox/HR/Dyspnea  99% and 74/min and dyspnea of 1/10    Peak measures 97 % and 94/min and dyspnea of 2/10   Final Pulse Ox/HR 99% and 88/min and dyspnea of 1/10   Desaturated </= 88% no   Desaturated <= 3% points no   Got Tachycardic >/= 90/min yes   Miscellaneous comments Did well      CPST JUNE 2024   CPX:  Exercise testing with gas exchange demonstrates a normal peak VO2 of 15.2 ml/kg/min (94% of the age/gender/weight matched sedentary norms). The RER of 1.16 indicates a maximal effort. When adjusted to the patient's ideal body weight of 124 lb (56.3 kg) the peak VO2 is  18.6 ml/kg (ibw)/min (102% of the ibw-adjusted predicted). The VE/VCO2 slope is mildly elevated and indicates mildly increased dead space ventilation. The oxygen uptake efficiency slope (OUES) is normal. The VO2 at the ventilatory threshold was normal at 77% of the predicted peak VO2. At peak exercise, the ventilation reached 47% of the measured MVV indicating ventilatory reserve remained. The O2pulse (a surrogate for stroke volume) increased with incremental exercise, reaching peak at 8 ml/beat (100% predicted).    Conclusion: Exercise testing with gas exchange demonstrates normal functional capacity when compared to matched sedentary norms. There is no clear cardiopulmonary abnormality, but cannot be completely excluded with the mildly elevated VE/VCO2 slope that can indicate increased pulmonary pressures with exercise. Patient's body habitus is contributing to exercise intolerance with 8% improvement in PVO2 with corrections for ideal body weight.    Test, report and preliminary impression by:  Reggy Eye, MS, ACSM-RCEP  02/15/2023 3:56 PM     CT chest NOV 2023 IMPRESSION: No acute or significant extracardiac findings.   Surgical clips and asymmetric density LEFT breast, recommend correlation with surgical history.   Subpleural interstitial changes anterolateral LEFT upper lobe question scarring or prior radiation therapy.     Electronically Signed   By: Ulyses Southward M.D.   On: 07/19/2022 16:02  Sleep: Home PSG 04/13/2020-moderate sleep apnea, AHI 18 with desats CPAP titration 05/26/2020-trial of CPAP at 9 cm H2O.  Did not require supplemental oxygen  CPAP download on 9 cmH2O 10/05/2010- usage days greater than 4 hours 60% Residual AHI 2.2   has a past medical history of Allergy (1990), Arthritis, Atypical mole (02/24/2015), Atypical mole (01/20/2022), Atypical mole (01/20/2022), Atypical nevi (02/24/2015), Atypical nevi (08/25/2015), Atypical nevi (08/25/2015), Atypical  nevi (02/23/2016), Atypical nevi (02/23/2016), Atypical nevi (02/23/2016), Atypical nevi (08/23/2016), Atypical nevi (07/25/2018), Atypical nevi (01/22/2019), Atypical nevi (01/22/2019), Atypical nevi (08/06/2019), Atypical nevi (08/06/2019), Blood transfusion without reported diagnosis, Breast cancer (HCC) (2022), CAD (coronary artery disease), native coronary artery (02/07/2019), Family history of breast cancer (08/19/2021), Family history of pancreatic cancer (08/19/2021), Fatty liver (05/21/2020), Gastric polyps (10/02/2018), GERD (gastroesophageal reflux disease) (10/02/2018), History of melanoma (10/02/2018), Hyperlipidemia, Hypertension, Melanoma (HCC) (2012), Osteopenia, Personal history of radiation therapy, Polyp of colon, adenomatous (10/02/2018), PONV (postoperative nausea and vomiting), Pulmonary hypertension, primary (HCC) (10/02/2018), SCCA (squamous cell carcinoma) of skin (09/18/2017), Sleep apnea, Sleep apnea (05/26/2020), and Tricuspid regurgitation.   reports that she quit smoking about 27 years ago. Her smoking use included cigarettes. She  started smoking about 37 years ago. She has a 2.5 pack-year smoking history. She has never used smokeless tobacco.  Past Surgical History:  Procedure Laterality Date   ABDOMINAL HYSTERECTOMY  1990   APPENDECTOMY  1962   BLADDER SUSPENSION  1990   BREAST LUMPECTOMY WITH RADIOACTIVE SEED LOCALIZATION Left 09/09/2021   Procedure: LEFT BREAST LUMPECTOMY WITH RADIOACTIVE SEED LOCALIZATION X2;  Surgeon: Manus Rudd, MD;  Location: [facility];  Service: General;  Laterality: Left;   COLONOSCOPY  2016   in [place]-hx of polyps   EYE SURGERY  2013 torn retina laser   MENISCUS REPAIR Right 2001   right knee   RIGHT HEART CATH AND CORONARY ANGIOGRAPHY N/A 06/20/2023   Procedure: RIGHT HEART CATH AND CORONARY ANGIOGRAPHY;  Surgeon: Laurey Morale, MD;  Location: [facility] INVASIVE CV [facility];  Service: Cardiovascular;  Laterality: N/A;   thumb surgery  Left    TONSILLECTOMY AND ADENOIDECTOMY     tuabl ligation     TUBAL LIGATION  1975   WISDOM TOOTH EXTRACTION      Allergies  Allergen Reactions   Morphine Rash and Nausea And Vomiting   Metoprolol Other (See Comments)   Nebivolol Other (See Comments)    bradycardia   Morphine And Codeine Hives, Dermatitis and Rash    Immunization History  Administered Date(s) Administered   Fluad Quad(high Dose 65+) 07/16/2019, 07/09/2021   Fluad Trivalent(High Dose 65+) 07/14/2023   Influenza, High Dose Seasonal PF 06/04/2020   PFIZER(Purple Top)SARS-COV-2 Vaccination 09/27/2019, 10/18/2019, 07/03/2020   Pneumococcal Conjugate-13 07/18/2013   Pneumococcal Polysaccharide-23 09/11/2009   Zoster Recombinant(Shingrix) 06/24/2020    Family History  Problem Relation Age of Onset   Arthritis Mother    High blood pressure Mother    Hyperlipidemia Mother    Hypertension Mother    Stroke Mother    Varicose Veins Mother    Heart attack Father    Heart disease Father    High blood pressure Father    Hypertension Father    Arthritis Sister    Diabetes Sister    High Cholesterol Sister    High blood pressure Sister    Breast cancer Sister 68   Varicose Veins Sister    Pancreatic cancer Maternal Aunt        dx early 80s   Heart disease Maternal Uncle    Heart disease Maternal Uncle    Stroke Paternal Aunt    Early death Maternal Grandmother        Stroke at 21   High blood pressure Maternal Grandmother    Hypertension Maternal Grandmother    Stroke Maternal Grandmother    Asthma Maternal Grandfather    Kidney disease Maternal Grandfather    Arthritis Paternal Grandmother    Heart disease Paternal Grandmother    High blood pressure Paternal Grandmother    Hypertension Paternal Grandmother    Kidney disease Paternal Grandfather    Asthma Paternal Grandfather    COPD Paternal Grandfather    Prostate cancer Cousin        paternal cousins x2; dx after 89   Colon polyps Neg Hx    Colon  cancer Neg Hx    Esophageal cancer Neg Hx    Rectal cancer Neg Hx    Stomach cancer Neg Hx      Current Outpatient Medications:    aspirin EC 81 MG tablet, Take 1 tablet (81 mg total) by mouth daily. Swallow whole., Disp: 30 tablet, Rfl: 12   diltiazem (CARDIZEM  CD) 300 MG 24 hr capsule, Take 1 capsule (300 mg total) by mouth daily., Disp: 90 capsule, Rfl: 3   ezetimibe (ZETIA) 10 MG tablet, Take 1 tablet (10 mg total) by mouth daily., Disp: 90 tablet, Rfl: 3   furosemide (LASIX) 20 MG tablet, Take 1 tablet (20 mg total) by mouth daily., Disp: 90 tablet, Rfl: 3   lisinopril (ZESTRIL) 10 MG tablet, Take 10 mg by mouth daily., Disp: , Rfl:    omeprazole (PRILOSEC) 20 MG capsule, Take 20 mg by mouth daily., Disp: , Rfl:    Prenatal Vit-Fe Fumarate-FA (PRENATAL VITAMIN PO), Take by mouth daily. Prenatal w/folic acid po daily, Disp: , Rfl:    Probiotic Product (ADVANCED PROBIOTIC PO), Take 1 capsule by mouth daily., Disp: , Rfl:    rosuvastatin (CRESTOR) 20 MG tablet, Take 1 tablet (20 mg total) by mouth daily., Disp: 90 tablet, Rfl: 3      Objective:   Vitals:   12/22/23 0906  BP: (!) 134/58  Pulse: 77  Temp: 98.1 F (36.7 C)  TempSrc: Oral  SpO2: 99%  Weight: 141 lb 3.2 oz (64 kg)  Height: 5\' 2"  (1.575 m)    Estimated body mass index is 25.83 kg/m as calculated from the following:   Height as of this encounter: 5\' 2"  (1.575 m).   Weight as of this encounter: 141 lb 3.2 oz (64 kg).  @WEIGHTCHANGE @  Filed Weights   12/22/23 0906  Weight: 141 lb 3.2 oz (64 kg)     Physical Exam   General: No distress. Looks well. Hasdry laryngeal cough O2 at rest: no Cane present: no Sitting in wheel chair: no Frail: no Obese: no Neuro: Alert and Oriented x 3. GCS 15. Speech normal Psych: Pleasant Resp:  Barrel Chest - no.  Wheeze - no, Crackles - no, No overt respiratory distress. DECREASED AIR ENTRY LEFT ASE CVS: Normal heart sounds. Murmurs - no Ext: Stigmata of Connective  Tissue Disease - no HEENT: Normal upper airway. PEERL +. No post nasal drip        Assessment:       ICD-10-CM   1. Pleural effusion, left  J90 Lactate Dehydrogenase (LDH)    Hepatic function panel    CBC w/Diff    INR/PT    TSH    US THORACENTESIS ASP PLEURAL SPACE W/IMG GUIDE    QuantiFERON-TB Gold Plus    Sed Rate (ESR)    Sed Rate (ESR)    Lactate Dehydrogenase (LDH)    Hepatic function panel    CBC w/Diff    INR/PT    TSH    QuantiFERON-TB Gold Plus    2. Chronic cough  R05.3 Lactate Dehydrogenase (LDH)    Hepatic function panel    CBC w/Diff    INR/PT    TSH    QuantiFERON-TB Gold Plus    Sed Rate (ESR)    Sed Rate (ESR)    Lactate Dehydrogenase (LDH)    Hepatic function panel    CBC w/Diff    INR/PT    TSH    QuantiFERON-TB Gold Plus    3. History of anemia  Z86.2 Lactate Dehydrogenase (LDH)    Hepatic function panel    CBC w/Diff    INR/PT    TSH    QuantiFERON-TB Gold Plus    Sed Rate (ESR)    Sed Rate (ESR)    Lactate Dehydrogenase (LDH)    Hepatic function panel    CBC w/Diff  INR/PT    TSH    QuantiFERON-TB Gold Plus    4. Elevated serum creatinine  R79.89 Lactate Dehydrogenase (LDH)    Hepatic function panel    CBC w/Diff    INR/PT    TSH    QuantiFERON-TB Gold Plus    Sed Rate (ESR)    Sed Rate (ESR)    Lactate Dehydrogenase (LDH)    Hepatic function panel    CBC w/Diff    INR/PT    TSH    QuantiFERON-TB Gold Plus    5. Weight loss, unintentional  R63.4     6. History of breast cancer  Z85.3 Lactate Dehydrogenase (LDH)    Hepatic function panel    CBC w/Diff    INR/PT    TSH    QuantiFERON-TB Gold Plus    Sed Rate (ESR)    Sed Rate (ESR)    Lactate Dehydrogenase (LDH)    Hepatic function panel    CBC w/Diff    INR/PT    TSH    QuantiFERON-TB Gold Plus    7. History of radiation exposure  Z92.3 Lactate Dehydrogenase (LDH)    Hepatic function panel    CBC w/Diff    INR/PT    TSH    QuantiFERON-TB Gold Plus     Sed Rate (ESR)    Sed Rate (ESR)    Lactate Dehydrogenase (LDH)    Hepatic function panel    CBC w/Diff    INR/PT    TSH    QuantiFERON-TB Gold Plus    8. Nonrheumatic tricuspid valve regurgitation  I36.1 Lactate Dehydrogenase (LDH)    Hepatic function panel    CBC w/Diff    INR/PT    TSH    QuantiFERON-TB Gold Plus    Sed Rate (ESR)    Sed Rate (ESR)    Lactate Dehydrogenase (LDH)    Hepatic function panel    CBC w/Diff    INR/PT    TSH    QuantiFERON-TB Gold Plus    9. History of obstructive sleep apnea  Z86.69 Lactate Dehydrogenase (LDH)    Hepatic function panel    CBC w/Diff    INR/PT    TSH    QuantiFERON-TB Gold Plus    Sed Rate (ESR)    Sed Rate (ESR)    Lactate Dehydrogenase (LDH)    Hepatic function panel    CBC w/Diff    INR/PT    TSH    QuantiFERON-TB Gold Plus         Plan:     Patient Instructions     ICD-10-CM   1. Pleural effusion, left  J90     2. Chronic cough  R05.3     3. History of anemia  Z86.2     4. Elevated serum creatinine  R79.89      Glad you are off lisinopril which can make cough worse Unclear what is driving your pleural effusion - post infection or cardiac  Plan - check bnp - check cbc, lft, chemistry - check coags - check TSH - check ESR and QUantiferon gold -refer [place] thora with labs - OK for TEE next week   Followup  - 2-3 weeks with APP/Koven Belinsky ; video visit ok to review labs and next steps  - HRCt chest depending on course   FOLLOWUP Return for with Dr Marchelle Gearing, with any of the APPS, Face to Face OR Video Visit.    SIGNATURE    Dr. Kalman Shan, M.D.,  F.C.C.P,  Pulmonary and Critical Care Medicine Staff Physician, [facility] Center Director - Interstitial Lung Disease  Program  Pulmonary Fibrosis [facility] at [facility] [place], [place], [address]  Pager: [phone], If no answer or between  15:00h - 7:00h: call [phone] Telephone: [phone]  2:16 PM 12/22/2023

## 2023-12-22 NOTE — Patient Instructions (Addendum)
 ICD-10-CM   1. Pleural effusion, left  J90     2. Chronic cough  R05.3     3. History of anemia  Z86.2     4. Elevated serum creatinine  R79.89      Glad you are off lisinopril which can make cough worse Unclear what is driving your pleural effusion - post infection or cardiac  Plan - check bnp - check cbc, lft, chemistry - check coags - check TSH - check ESR and QUantiferon gold -refer [place] thora with labs - OK for TEE next week   Followup  - 2-3 weeks with APP/Glinda Natzke ; video visit ok to review labs and next steps  - HRCt chest depending on course

## 2023-12-25 ENCOUNTER — Ambulatory Visit (HOSPITAL_COMMUNITY)
Admission: RE | Admit: 2023-12-25 | Discharge: 2023-12-25 | Disposition: A | Discharge status: Home / Self Care | Source: Ambulatory Visit | Attending: Internal Medicine | Admitting: Internal Medicine

## 2023-12-25 ENCOUNTER — Inpatient Hospital Stay: Attending: Hematology

## 2023-12-25 ENCOUNTER — Other Ambulatory Visit: Payer: Self-pay | Admitting: Hematology

## 2023-12-25 ENCOUNTER — Ambulatory Visit (HOSPITAL_COMMUNITY)
Admission: RE | Admit: 2023-12-25 | Discharge: 2023-12-25 | Disposition: A | Discharge status: Home / Self Care | Source: Ambulatory Visit | Attending: Urology | Admitting: Urology

## 2023-12-25 DIAGNOSIS — Z48813 Encounter for surgical aftercare following surgery on the respiratory system: Secondary | ICD-10-CM | POA: Diagnosis not present

## 2023-12-25 DIAGNOSIS — C50812 Malignant neoplasm of overlapping sites of left female breast: Secondary | ICD-10-CM | POA: Insufficient documentation

## 2023-12-25 DIAGNOSIS — M8580 Other specified disorders of bone density and structure, unspecified site: Secondary | ICD-10-CM

## 2023-12-25 DIAGNOSIS — J910 Malignant pleural effusion: Secondary | ICD-10-CM | POA: Insufficient documentation

## 2023-12-25 DIAGNOSIS — C801 Malignant (primary) neoplasm, unspecified: Secondary | ICD-10-CM | POA: Insufficient documentation

## 2023-12-25 DIAGNOSIS — Z1732 Human epidermal growth factor receptor 2 negative status: Secondary | ICD-10-CM | POA: Insufficient documentation

## 2023-12-25 DIAGNOSIS — D519 Vitamin B12 deficiency anemia, unspecified: Secondary | ICD-10-CM | POA: Insufficient documentation

## 2023-12-25 DIAGNOSIS — C4490 Unspecified malignant neoplasm of skin, unspecified: Secondary | ICD-10-CM | POA: Diagnosis not present

## 2023-12-25 DIAGNOSIS — Z90710 Acquired absence of both cervix and uterus: Secondary | ICD-10-CM | POA: Insufficient documentation

## 2023-12-25 DIAGNOSIS — Z1721 Progesterone receptor positive status: Secondary | ICD-10-CM | POA: Insufficient documentation

## 2023-12-25 DIAGNOSIS — Z170 Estrogen receptor positive status [ER+]: Secondary | ICD-10-CM | POA: Insufficient documentation

## 2023-12-25 DIAGNOSIS — Z923 Personal history of irradiation: Secondary | ICD-10-CM | POA: Insufficient documentation

## 2023-12-25 DIAGNOSIS — R918 Other nonspecific abnormal finding of lung field: Secondary | ICD-10-CM | POA: Diagnosis not present

## 2023-12-25 DIAGNOSIS — J984 Other disorders of lung: Secondary | ICD-10-CM | POA: Diagnosis not present

## 2023-12-25 DIAGNOSIS — I272 Pulmonary hypertension, unspecified: Secondary | ICD-10-CM | POA: Insufficient documentation

## 2023-12-25 DIAGNOSIS — J90 Pleural effusion, not elsewhere classified: Secondary | ICD-10-CM

## 2023-12-25 DIAGNOSIS — I10 Essential (primary) hypertension: Secondary | ICD-10-CM | POA: Insufficient documentation

## 2023-12-25 LAB — GRAM STAIN

## 2023-12-25 LAB — BODY FLUID CELL COUNT WITH DIFFERENTIAL
Eos, Fluid: 0 %
Lymphs, Fluid: 58 %
Monocyte-Macrophage-Serous Fluid: 32 % — ABNORMAL LOW (ref 50–90)
Neutrophil Count, Fluid: 10 % (ref 0–25)
Total Nucleated Cell Count, Fluid: 817 uL (ref 0–1000)

## 2023-12-25 LAB — LACTATE DEHYDROGENASE, PLEURAL OR PERITONEAL FLUID: LD, Fluid: 121 U/L — ABNORMAL HIGH (ref 3–23)

## 2023-12-25 LAB — PROTEIN, PLEURAL OR PERITONEAL FLUID: Total protein, fluid: 4.1 g/dL

## 2023-12-25 LAB — GLUCOSE, PLEURAL OR PERITONEAL FLUID: Glucose, Fluid: 128 mg/dL

## 2023-12-25 MED ORDER — LIDOCAINE HCL 1 % IJ SOLN
INTRAMUSCULAR | Status: AC
  Filled 2023-12-25: qty 20

## 2023-12-25 MED ORDER — CYANOCOBALAMIN 1000 MCG/ML IJ SOLN
1000.0000 ug | Freq: Once | INTRAMUSCULAR | Status: AC
  Administered 2023-12-25: 1000 ug via INTRAMUSCULAR
  Filled 2023-12-25: qty 1

## 2023-12-25 NOTE — Procedures (Signed)
 PROCEDURE SUMMARY:  Successful image-guided left diagnostic and therapeutic thoracentesis. Yielded 0.9 liters of clear, straw-colored pleural fluid. Patient tolerated procedure well. EBL: Zero No immediate complications.  Specimen was sent for labs. Post procedure CXR shows no pneumothorax.  Please see imaging section of Epic for full dictation.  Lovena Rubinstein PA-C 12/25/2023 3:37 PM

## 2023-12-26 DIAGNOSIS — Z79899 Other long term (current) drug therapy: Secondary | ICD-10-CM | POA: Diagnosis not present

## 2023-12-26 LAB — BASIC METABOLIC PANEL WITH GFR
BUN/Creatinine Ratio: 8 — ABNORMAL LOW (ref 12–28)
BUN: 13 mg/dL (ref 8–27)
CO2: 21 mmol/L (ref 20–29)
Calcium: 9.5 mg/dL (ref 8.7–10.3)
Chloride: 102 mmol/L (ref 96–106)
Creatinine, Ser: 1.57 mg/dL — ABNORMAL HIGH (ref 0.57–1.00)
Glucose: 105 mg/dL — ABNORMAL HIGH (ref 70–99)
Potassium: 3.9 mmol/L (ref 3.5–5.2)
Sodium: 140 mmol/L (ref 134–144)
eGFR: 33 mL/min/{1.73_m2} — ABNORMAL LOW (ref >59)

## 2023-12-26 LAB — QUANTIFERON-TB GOLD PLUS
Mitogen-NIL: 0.84 [IU]/mL
NIL: 0.05 [IU]/mL
QuantiFERON-TB Gold Plus: NEGATIVE
TB1-NIL: 0.01 [IU]/mL
TB2-NIL: 0 [IU]/mL

## 2023-12-26 LAB — LACTATE DEHYDROGENASE

## 2023-12-28 LAB — ACID FAST SMEAR (AFB, MYCOBACTERIA): Acid Fast Smear: NEGATIVE

## 2023-12-29 ENCOUNTER — Telehealth: Payer: Self-pay | Admitting: Hematology

## 2023-12-29 LAB — CYTOLOGY - NON PAP

## 2023-12-30 LAB — CULTURE, BODY FLUID W GRAM STAIN -BOTTLE: Culture: NO GROWTH

## 2023-12-31 ENCOUNTER — Encounter: Payer: Self-pay | Admitting: Internal Medicine

## 2023-12-31 NOTE — Progress Notes (Signed)
 Pleural fluid results are suggesting cancerous fluid of the breast . To discuss 01/05/24 visit

## 2024-01-01 ENCOUNTER — Telehealth: Payer: Self-pay | Admitting: Hematology

## 2024-01-01 DIAGNOSIS — C799 Secondary malignant neoplasm of unspecified site: Secondary | ICD-10-CM | POA: Diagnosis not present

## 2024-01-01 DIAGNOSIS — D485 Neoplasm of uncertain behavior of skin: Secondary | ICD-10-CM | POA: Diagnosis not present

## 2024-01-01 DIAGNOSIS — D2262 Melanocytic nevi of left upper limb, including shoulder: Secondary | ICD-10-CM | POA: Diagnosis not present

## 2024-01-01 DIAGNOSIS — L821 Other seborrheic keratosis: Secondary | ICD-10-CM | POA: Diagnosis not present

## 2024-01-01 DIAGNOSIS — D225 Melanocytic nevi of trunk: Secondary | ICD-10-CM | POA: Diagnosis not present

## 2024-01-01 DIAGNOSIS — L814 Other melanin hyperpigmentation: Secondary | ICD-10-CM | POA: Diagnosis not present

## 2024-01-01 DIAGNOSIS — D2272 Melanocytic nevi of left lower limb, including hip: Secondary | ICD-10-CM | POA: Diagnosis not present

## 2024-01-01 NOTE — Telephone Encounter (Signed)
 Rescheduled appointment per provider requested. The patient is aware of the appointment details.

## 2024-01-01 NOTE — Assessment & Plan Note (Deleted)
 Stage IA, pT2, cN0, ER+/PR+/HER2-, Grade 2, RS 21, malignant pleural effusion in 12/2023 -she was under short-term f/u for left breast asymmetry. Biopsy 08/11/21 confirmed invasive mammary carcinoma. -left lumpectomy on 09/09/21 by Dr. 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 No nodes were removed due to her age. -she received adjuvant RT, completed in 11/2021 -she started adjuvant tamoxifen  in 12/2021, but could not tolerate. She subsequently tried anastrozole  and still can not tolerate. She stopped in 04/2022 -Screening mammogram in May 2024 were negative. - She developed pleural effusion in April 2025, cytology unfortunately showed malignant cells, consistent with breast primary. ERand PR moderately positive, Her2 negative (IHC 0)

## 2024-01-01 NOTE — Assessment & Plan Note (Signed)
 Stage IA, pT2, cN0, ER+/PR+/HER2-, Grade 2, RS 21, malignant pleural effusion in 12/2023 -she was under short-term f/u for left breast asymmetry. Biopsy 08/11/21 confirmed invasive mammary carcinoma. -left lumpectomy on 09/09/21 by Dr. 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 No nodes were removed due to her age. -she received adjuvant RT, completed in 11/2021 -she started adjuvant tamoxifen  in 12/2021, but could not tolerate. She subsequently tried anastrozole  and still can not tolerate. She stopped in 04/2022 -Screening mammogram in May 2024 were negative. - She developed pleural effusion in April 2025, cytology unfortunately showed malignant cells, consistent with breast primary. ERand PR moderately positive, Her2 negative (IHC 0)

## 2024-01-01 NOTE — Assessment & Plan Note (Signed)
-  Patient developed anemia with hemoglobin 9.2 in October 2024, anemia workup showed low B12 level at 162, intrinsic factor was negative -Patient has been on B12 injection since then with loading dose initially

## 2024-01-02 ENCOUNTER — Inpatient Hospital Stay (HOSPITAL_BASED_OUTPATIENT_CLINIC_OR_DEPARTMENT_OTHER): Admitting: Hematology

## 2024-01-02 ENCOUNTER — Ambulatory Visit: Admitting: Hematology

## 2024-01-02 ENCOUNTER — Encounter: Payer: Self-pay | Admitting: Hematology

## 2024-01-02 VITALS — BP 134/60 | HR 103 | Temp 98.1°F | Ht 62.0 in | Wt 142.8 lb

## 2024-01-02 DIAGNOSIS — I272 Pulmonary hypertension, unspecified: Secondary | ICD-10-CM | POA: Diagnosis not present

## 2024-01-02 DIAGNOSIS — Z1721 Progesterone receptor positive status: Secondary | ICD-10-CM | POA: Diagnosis not present

## 2024-01-02 DIAGNOSIS — Z90710 Acquired absence of both cervix and uterus: Secondary | ICD-10-CM | POA: Diagnosis not present

## 2024-01-02 DIAGNOSIS — Z1732 Human epidermal growth factor receptor 2 negative status: Secondary | ICD-10-CM | POA: Diagnosis not present

## 2024-01-02 DIAGNOSIS — Z923 Personal history of irradiation: Secondary | ICD-10-CM | POA: Diagnosis not present

## 2024-01-02 DIAGNOSIS — J910 Malignant pleural effusion: Secondary | ICD-10-CM | POA: Diagnosis not present

## 2024-01-02 DIAGNOSIS — C50812 Malignant neoplasm of overlapping sites of left female breast: Secondary | ICD-10-CM

## 2024-01-02 DIAGNOSIS — Z170 Estrogen receptor positive status [ER+]: Secondary | ICD-10-CM | POA: Diagnosis not present

## 2024-01-02 DIAGNOSIS — I10 Essential (primary) hypertension: Secondary | ICD-10-CM | POA: Diagnosis not present

## 2024-01-02 DIAGNOSIS — D519 Vitamin B12 deficiency anemia, unspecified: Secondary | ICD-10-CM

## 2024-01-02 LAB — FLOW CYTOMETRY REQUEST - FLUID (INPATIENT)

## 2024-01-02 MED ORDER — ANASTROZOLE 1 MG PO TABS: 1.0000 mg | ORAL_TABLET | Freq: Every day | ORAL | 1 refills | Status: DC

## 2024-01-02 NOTE — Progress Notes (Signed)
 [facility]   Telephone:([phone]) [phone] [phone]   Clinic Follow up Note   Patient Care Team: Luevenia Saha, MD as PCP - General (Family Medicine) Jacqueline Matsu, MD as PCP - Cardiology (Cardiology) Jacqueline Matsu, MD as Consulting Physician (Cardiology) Rexene Catching, MD as Consulting Physician (Ophthalmology) Dorthey Gave, PA-C as Physician Assistant (Dermatology) Dareen Ebbing, MD as Consulting Physician (General Surgery) Sonja Mandaree, MD as Consulting Physician (Hematology) Colie Dawes, MD as Attending Physician (Radiation Oncology) Myrle Aspen, [facility] (Inactive) (Pharmacist)  Date of Service:  01/02/2024  CHIEF COMPLAINT: Newly diagnosed metastatic breast cancer  CURRENT THERAPY:  Pending anastrozole   Oncology History   Malignant neoplasm of overlapping sites of left breast in female, estrogen receptor positive (HCC) Stage IA, pT2, cN0, ER+/PR+/HER2-, Grade 2, RS 21, malignant pleural effusion in 12/2023 -she was under short-term f/u for left breast asymmetry. Biopsy 08/11/21 confirmed invasive mammary carcinoma. -left lumpectomy on 09/09/21 by Dr. 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 No nodes were removed due to her age. -she received adjuvant RT, completed in 11/2021 -she started adjuvant tamoxifen  in 12/2021, but could not tolerate. She subsequently tried anastrozole  and still can not tolerate. She stopped in 04/2022 -Screening mammogram in May 2024 were negative. - She developed pleural effusion in April 2025, cytology unfortunately showed malignant cells, consistent with breast primary. 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 Estrogen receptor and progesterone receptor positive, HER2 negative. Previous anti-estrogen therapy poorly tolerated due to fatigue. Discussed treatment options including oral anastrozole  and fulvestrant injection. She prefers fulvestrant injection due to previous poor tolerance of oral medications, but is willing to try anastrozole  again due to the possibility that that she may need a [name] scan.  - I recommend FDG PET scan to assess for further metastasis.  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 Fluid drainage performed on April 14, providing symptomatic relief. Recurrence of effusion likely without treatment, necessitating potential repeat drainage. - Monitor symptoms and consider repeat drainage if necessary - Coordinate with pulmonologist for potential future drainage  Anemia, unspecified Anemia likely related to cancer. Previous B12 deficiency managed with monthly B12 injections. - Continue monthly B12 injections - Monitor hemoglobin levels  Pulmonary hypertension Pulmonary hypertension with recent improvement on echocardiogram. No acute management changes.  Tricuspid regurgitation Tricuspid regurgitation noted in medical history. No acute management changes.   Hypertension Hypertension managed with medication adjustments. Recent discontinuation of lisinopril  due to kidney function concerns.   Plan -I reviewed her recent cytology from pleural effusion which confirmed metastatic breast cancer - Schedule PET scan within 1-2 weeks - Follow up with phone visit 3 days after PET scan results - I called in anastrozole  1 mg daily for her, she is willing to try again, if she does not tolerate, will switch to fulvestrant  injection.      SUMMARY OF ONCOLOGIC HISTORY: Oncology History Overview Note   Cancer Staging  Malignant neoplasm of overlapping sites of  left breast in female, estrogen receptor positive (HCC) Staging form: Breast, AJCC 8th Edition - Clinical stage from 08/11/2021: Stage IA (cT1c, cN0, cM0, G2, ER+, PR+, HER2-) - Signed by Sonja Girard, MD on 08/18/2021     Malignant neoplasm of overlapping sites of left breast in female, estrogen receptor positive (HCC)  07/27/2021 Mammogram   EXAM: DIGITAL DIAGNOSTIC UNILATERAL LEFT MAMMOGRAM WITH TOMOSYNTHESIS AND CAD; ULTRASOUND LEFT BREAST LIMITED  IMPRESSION: 1. There is a suspicious mass in the left breast at 9 o'clock measuring 1.8 cm.   2. There is a 1.0 cm group of suspicious linear calcifications anterior to the suspicious mass in the left breast, as well as a faint 2 mm group which lie 1 cm lateral to the linear calcifications.   3.  No evidence of left axillary lymphadenopathy   08/11/2021 Cancer Staging   Staging form: Breast, AJCC 8th Edition - Clinical stage from 08/11/2021: Stage IA (cT1c, cN0, cM0, G2, ER+, PR+, HER2-) - Signed by Sonja Lincoln, MD on 08/18/2021 Stage prefix: Initial diagnosis Histologic grading system: 3 grade system   08/11/2021 Initial Biopsy   Diagnosis 1. Breast, left, needle core biopsy, 9 o'clock, ribbon clip - INVASIVE MAMMARY CARCINOMA - SEE COMMENT 2. Breast, left, needle core biopsy, lower inner quadrant, x clip - INVASIVE MAMMARY CARCINOMA - MAMMARY CARCINOMA IN-SITU - CALCIFICATIONS - SEE COMMENT Microscopic Comment 1. The biopsy material shows an infiltrative proliferation of cells with arranged linearly and in small clusters. Based on the biopsy, the carcinoma appears Nottingham grade 2 of 3 and measures 1.2 cm in greatest linear extent. 2. Based on the biopsy, the carcinoma appears Nottingham grade 2 of 3 and measures 0.2 cm in greatest linear extent.  1. PROGNOSTIC  INDICATORS Results: The tumor cells are NEGATIVE for Her2 (1+). Estrogen Receptor: 100%, POSITIVE, STRONG STAINING INTENSITY Progesterone Receptor: 60%, POSITIVE, STRONG STAINING INTENSITY Proliferation Marker Ki67: 10%  2. PROGNOSTIC INDICATORS Results: The tumor cells are NEGATIVE for Her2 (1+). Estrogen Receptor: 100%, POSITIVE, STRONG STAINING INTENSITY Progesterone Receptor: 60%, POSITIVE, STRONG STAINING INTENSITY Proliferation Marker Ki67: 15%   08/13/2021 Mammogram   EXAM: DIGITAL DIAGNOSTIC UNILATERAL RIGHT MAMMOGRAM WITH TOMOSYNTHESIS AND CAD  IMPRESSION: No mammographic evidence for malignancy.   08/16/2021 Initial Diagnosis   Malignant neoplasm of overlapping sites of left breast in female, estrogen receptor positive (HCC)   08/25/2021 Imaging   EXAM: BILATERAL BREAST MRI WITH AND WITHOUT CONTRAST  IMPRESSION: 1. Biopsy-proven invasive ductal carcinoma measuring approximately 2.8 x 1.7 x 1.5 cm in the inner breast at middle depth, associated with the ribbon shaped tissue marking clip placed at the time of core needle biopsy. Enhancement extends approximately 1.4 cm posterior to the clip. 2. Approximate 1.8 cm post biopsy hematoma with associated rim enhancement at the site of the biopsy-proven invasive ductal carcinoma and DCIS in the LOWER INNER QUADRANT at anterior depth associated with the X shaped tissue marking clip. 3. In combination, the overall enhancement spans approximately 4 cm. 4. No MRI evidence of malignancy involving the RIGHT breast. 5. No pathologic lymphadenopathy.   08/27/2021 Genetic Testing   egative hereditary cancer genetic testing: no pathogenic variants detected in [facility] BRCAPlus Panel and CancerNext-Expanded +RNAinsight Panel.  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  The report dates are 08/27/2021 and 08/30/2021.   The BRCAplus panel offered by [employer] and includes sequencing and  deletion/duplication analysis for the following 8 genes: ATM, BRCA1, BRCA2, CDH1, CHEK2, PALB2, PTEN, and TP53.  The  CancerNext-Expanded gene panel offered by [employer] and includes sequencing, rearrangement, and RNA analysis for the following 77 genes: AIP, ALK, APC, ATM, AXIN2, BAP1, BARD1, BLM, BMPR1A, BRCA1, BRCA2, BRIP1, CDC73, CDH1, CDK4, CDKN1B, CDKN2A, CHEK2, CTNNA1, DICER1, FANCC, FH, FLCN, GALNT12, KIF1B, LZTR1, MAX, MEN1, MET, MLH1, MSH2, MSH3, MSH6, MUTYH, NBN, NF1, NF2, NTHL1, PALB2, PHOX2B, PMS2, POT1, PRKAR1A, PTCH1, PTEN, RAD51C, RAD51D, RB1, RECQL, RET, SDHA, SDHAF2, SDHB, SDHC, SDHD, SMAD4, SMARCA4, SMARCB1, SMARCE1, STK11, SUFU, TMEM127, TP53, TSC1, TSC2, VHL and XRCC2 (sequencing and deletion/duplication); EGFR, EGLN1, HOXB13, KIT, MITF, PDGFRA, POLD1, and POLE (sequencing only); EPCAM and GREM1 (deletion/duplication only).    09/09/2021 Definitive Surgery   FINAL MICROSCOPIC DIAGNOSIS:   A. BREAST, LEFT, LUMPECTOMY:  -  Invasive ductal carcinoma, Nottingham grade 2 of 3, 3.5 cm  -  Ductal carcinoma in-situ, intermediate grade  -  Margins uninvolved by carcinoma (<0.1 cm; anterior)  -  Previous biopsy site changes present    09/09/2021 Oncotype testing   Oncotype DX was obtained on the final surgical sample and the recurrence score of 21 predicts a risk of recurrence outside the breast over the next 9 years of 7%, if the patient's only systemic therapy is an antiestrogen for 5 years.  It also predicts no benefit from chemotherapy.   10/20/2021 - 11/16/2021 Radiation Therapy   Site Technique Total Dose (Gy) Dose per Fx (Gy) Completed Fx Beam Energies  Breast, Left: Breast_L_axilla 3D 40.05/40.05 2.67 15/15 10X  Breast, Left: Breast_L_Bst 3D 10/10 2 5/5 6X, 10X     12/06/2021 - 04/2022 Anti-estrogen oral therapy   Tamoxifen  x 5 years unable to tolerate, attempted anastrozole  and had similar side effects.  Opted to forego adjuvant antiestrogen therapy      Discussed the use  of AI scribe software for clinical note transcription with the patient, who gave verbal consent to proceed.  History of Present Illness The patient, an 78 year old female with a history of breast cancer, presents for follow-up after a recent diagnosis of malignant pleural effusion. She reports experiencing shortness of breath, which led to the discovery of the pleural effusion. She also mentions a persistent cough that has been present for about two months, which she initially attributed to a virus. The cough is more pronounced during the day and is triggered by talking. She denies any coughing at night.  The patient also reports a lack of appetite, which has been ongoing since February. She has experienced some weight loss, losing about 13 pounds, but has since gained a couple of pounds back. She reports a significant decrease in energy levels, stating that she feels "zero" energy. She also mentions experiencing fatigue for some time.  The patient has a history of heart disease and was undergoing rehab for her heart. She also mentions having had two bouts of what she thought was a virus. She has been taken off Lasix , lisinopril , and half of her Crestor  medication due to kidney problems. She reports some ankle swelling, which she attributes to being taken off Lasix .  The patient has a history of breast cancer and has recently been diagnosed with malignant pleural effusion. She underwent thoracentesis on April 14th, during which cancer cells were found in the drained fluid. She reports feeling better after the drainage but still has a persistent cough.     All other systems were reviewed with the patient and are negative.  MEDICAL HISTORY:  Past Medical History:  Diagnosis Date   Allergy 1990   Morphine following surgery  Arthritis    generalized   Atypical mole 02/24/2015   RGHT LATERAL THIGH MODERATE   Atypical mole 01/20/2022   Left Breast (mild)   Atypical mole 01/20/2022   Mid Back  (mild)   Atypical nevi 02/24/2015   RIGHT NECK MILD   Atypical nevi 08/25/2015   LEFT UPPER ARM MILD   Atypical nevi 08/25/2015   LEFT LATERAL FOREARM MILD/FREE   Atypical nevi 02/23/2016   LEFT THIGH MODERATE/FREE   Atypical nevi 02/23/2016   LEFT MEDIAL LEG MODERATE/FREE   Atypical nevi 02/23/2016   LEFT UPPER BACK MILD /FREE   Atypical nevi 08/23/2016   RIGHT ANT PROX THIGH MILD /FREE   Atypical nevi 07/25/2018   MID LOWER BACK MODERATE   Atypical nevi 01/22/2019   LEFT MID BACK MILD /FREE   Atypical nevi 01/22/2019   LEFT NECK MILD   Atypical nevi 08/06/2019   LEFT INNER BREAST MODERATE W/S   Atypical nevi 08/06/2019   LEFT OUTER SIDE MILD/FREE   Blood transfusion without reported diagnosis    hx of   Breast cancer (HCC) 2022   CAD (coronary artery disease), native coronary artery 02/07/2019   Minimal CAD at the time of cath in 2016 in [place]    Family history of breast cancer 08/19/2021   Family history of pancreatic cancer 08/19/2021   Fatty liver 05/21/2020   Gastric polyps 10/02/2018   EGD 03/2015; benign   GERD (gastroesophageal reflux disease) 10/02/2018   on meds   History of melanoma 10/02/2018   Abdomen 2012; 2013; s/p local excisions.    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 DISTAL LOWER LEG TREATED BY DR. Duaine German   Sleep apnea    Sleep apnea 05/26/2020   uses CPAP   Tricuspid regurgitation    moderate by echo 04/2023    SURGICAL HISTORY: Past Surgical History:  Procedure Laterality Date   ABDOMINAL HYSTERECTOMY  1990   APPENDECTOMY  1962    BLADDER SUSPENSION  1990   BREAST LUMPECTOMY WITH RADIOACTIVE SEED LOCALIZATION Left 09/09/2021   Procedure: LEFT BREAST LUMPECTOMY WITH RADIOACTIVE SEED LOCALIZATION X2;  Surgeon: Dareen Ebbing, MD;  Location: [facility];  Service: General;  Laterality: Left;   COLONOSCOPY  2016   in [place]-hx of polyps   EYE SURGERY  2013 torn retina laser   MENISCUS REPAIR Right 2001   right knee   RIGHT HEART CATH AND CORONARY ANGIOGRAPHY N/A 06/20/2023   Procedure: RIGHT HEART CATH AND CORONARY ANGIOGRAPHY;  Surgeon: Darlis Eisenmenger, MD;  Location: [facility] INVASIVE CV LAB;  Service: Cardiovascular;  Laterality: N/A;   thumb surgery Left    TONSILLECTOMY AND ADENOIDECTOMY     tuabl ligation     TUBAL LIGATION  1975   WISDOM TOOTH EXTRACTION      I have reviewed the social history and family history with the patient and they are unchanged from previous note.  ALLERGIES:  is allergic to morphine, metoprolol , nebivolol , and morphine and codeine.  MEDICATIONS:  Current Outpatient Medications  Medication Sig Dispense  Refill   anastrozole  (ARIMIDEX ) 1 MG tablet Take 1 tablet (1 mg total) by mouth daily. 30 tablet 1   aspirin  EC 81 MG tablet Take 1 tablet (81 mg total) by mouth daily. Swallow whole. 30 tablet 12   diltiazem  (CARDIZEM  CD) 300 MG 24 hr capsule Take 1 capsule (300 mg total) by mouth daily. 90 capsule 3   ezetimibe  (ZETIA ) 10 MG tablet Take 1 tablet (10 mg total) by mouth daily. 90 tablet 3   omeprazole  (PRILOSEC) 20 MG capsule Take 20 mg by mouth daily.     Prenatal Vit-Fe Fumarate-FA (PRENATAL VITAMIN PO) Take by mouth daily. Prenatal w/folic acid po daily     Probiotic Product (ADVANCED PROBIOTIC PO) Take 1 capsule by mouth daily.     rosuvastatin  (CRESTOR ) 20 MG tablet Take 1 tablet (20 mg total) by mouth daily. 90 tablet 3   No current facility-administered medications for this visit.    PHYSICAL EXAMINATION: ECOG PERFORMANCE STATUS: 2 - Symptomatic, <50% confined to  bed  Vitals:   01/02/24 1106 01/02/24 1108  BP: (!) 160/60 134/60  Pulse: (!) 103   Temp: 98.1 F (36.7 C)   SpO2: 94%    Wt Readings from Last 3 Encounters:  01/02/24 142 lb 12.8 oz (64.8 kg)  12/22/23 141 lb 3.2 oz (64 kg)  12/11/23 139 lb 9.6 oz (63.3 kg)     GENERAL:alert, no distress and comfortable SKIN: skin color, texture, turgor are normal, no rashes or significant lesions EYES: normal, Conjunctiva are pink and non-injected, sclera clear NECK: supple, thyroid  normal size, non-tender, without nodularity LYMPH:  no palpable lymphadenopathy in the cervical, axillary  LUNGS: clear to auscultation and percussion with normal breathing effort with decreased breath sounds on the lower left chest HEART: regular rate & rhythm and no murmurs and no lower extremity edema ABDOMEN:abdomen soft, non-tender and normal bowel sounds Musculoskeletal:no cyanosis of digits and no clubbing  NEURO: alert & oriented x 3 with fluent speech, no focal motor/sensory deficits  Physical Exam   LABORATORY DATA:  I have reviewed the data as listed    Latest Ref Rng & Units 12/22/2023    9:50 AM 12/12/2023    9:50 AM 11/20/2023    1:50 PM  CBC  WBC 4.0 - 10.5 K/uL 6.2  8.5  4.4   Hemoglobin 12.0 - 15.0 g/dL 8.2 Repeated and verified X2.  9.1  9.1   Hematocrit 36.0 - 46.0 % 24.2  27.6  27.9   Platelets 150.0 - 400.0 K/uL 229.0  316  193         Latest Ref Rng & Units 12/26/2023   11:30 AM 12/22/2023    9:50 AM 12/15/2023   11:14 AM  CMP  Glucose 70 - 99 mg/dL 161   096   BUN 8 - 27 mg/dL 13   26   Creatinine 0.45 - 1.00 mg/dL 4.09   8.11   Sodium 914 - 144 mmol/L 140   137   Potassium 3.5 - 5.2 mmol/L 3.9   4.4   Chloride 96 - 106 mmol/L 102   101   CO2 20 - 29 mmol/L 21   17   Calcium  8.7 - 10.3 mg/dL 9.5   9.0   Total Protein 6.0 - 8.3 g/dL  7.0    Total Bilirubin 0.2 - 1.2 mg/dL  0.7    Alkaline Phos 39 - 117 U/L  116    AST 0 - 37 U/L  44  ALT 0 - 35 U/L  30         RADIOGRAPHIC STUDIES: I have personally reviewed the radiological images as listed and agreed with the findings in the report. No results found.    Orders Placed This Encounter  Procedures   NM PET Image Initial (PI) Skull Base To Thigh    Standing Status:   Future    Expected Date:   01/09/2024    Expiration Date:   01/01/2025    If indicated for the ordered procedure, I authorize the administration of a radiopharmaceutical per Radiology protocol:   Yes    Preferred imaging location?:   [facility]   All questions were answered. The patient knows to call the clinic with any problems, questions or concerns. No barriers to learning was detected. The total time spent in the appointment was 40 minutes.     Sonja [place], MD 01/02/2024

## 2024-01-03 NOTE — Progress Notes (Signed)
 Spoke to patient and instructed them to come at 0630  and to be NPO after 0000.  Medications reviewed.    Confirmed that patient will have a ride home and someone to stay with them for 24 hours after the procedure.

## 2024-01-03 NOTE — Anesthesia Preprocedure Evaluation (Signed)
 Anesthesia Evaluation  Patient identified by MRN, date of birth, ID band Patient awake    Reviewed: Allergy & Precautions, NPO status , Patient's Chart, lab work & pertinent test results  History of Anesthesia Complications (+) PONV and history of anesthetic complications  Airway Mallampati: III  TM Distance: >3 FB Neck ROM: Full    Dental no notable dental hx.    Pulmonary sleep apnea and Continuous Positive Airway Pressure Ventilation , former smoker   Pulmonary exam normal breath sounds clear to auscultation       Cardiovascular hypertension, Pt. on medications pulmonary hypertension(-) angina + CAD  (-) Past MI Normal cardiovascular exam+ dysrhythmias Supra Ventricular Tachycardia + Valvular Problems/Murmurs (Severe TR)  Rhythm:Regular Rate:Normal  11/13/2023 TTE  1. Left ventricular ejection fraction, by estimation, is 65 to 70%. The  left ventricle has normal function. The left ventricle has no regional  wall motion abnormalities. Left ventricular diastolic parameters are  consistent with Grade I diastolic  dysfunction (impaired relaxation).   2. Right ventricular systolic function is normal. The right ventricular  size is mildly enlarged. There is mildly elevated pulmonary artery  systolic pressure. The estimated right ventricular systolic pressure is  39.7 mmHg.   3. Right atrial size was moderately dilated.   4. The mitral valve is normal in structure. No evidence of mitral valve  regurgitation. No evidence of mitral stenosis.   5. Tricuspid valve regurgitation is moderate to severe.   6. The aortic valve is tricuspid. There is mild calcification of the  aortic valve. Aortic valve regurgitation is not visualized. Aortic valve  sclerosis/calcification is present, without any evidence of aortic  stenosis.   7. The inferior vena cava is normal in size with greater than 50%  respiratory variability, suggesting right atrial  pressure of 3 mmHg     Neuro/Psych   Anxiety        GI/Hepatic ,GERD  Medicated and Controlled,,  Endo/Other    Renal/GU Renal diseaseLab Results      Component                Value               Date                              K                        3.9                 12/26/2023                        CREATININE               1.57 (H)            12/26/2023                  Musculoskeletal  (+) Arthritis ,    Abdominal   Peds  Hematology  (+) Blood dyscrasia, anemia Lab Results      Component                Value               Date                      WBC  6.2                 12/22/2023                HGB                                          12/22/2023            8.2 Repeated and verified X2. (L)      HCT                      24.2 (L)            12/22/2023                MCV                      91.4                12/22/2023                PLT                      229.0               12/22/2023              Anesthesia Other Findings All: Morphine Codiene, nebivolol , metoprolol   Metstatic breast CA  Reproductive/Obstetrics                             Anesthesia Physical Anesthesia Plan  ASA: 3  Anesthesia Plan: MAC   Post-op Pain Management: Minimal or no pain anticipated   Induction: Intravenous  PONV Risk Score and Plan: 2 and Propofol  infusion and Treatment may vary due to age or medical condition  Airway Management Planned: Natural Airway and Nasal Cannula  Additional Equipment: None  Intra-op Plan:   Post-operative Plan:   Informed Consent: I have reviewed the patients History and Physical, chart, labs and discussed the procedure including the risks, benefits and alternatives for the proposed anesthesia with the patient or authorized representative who has indicated his/her understanding and acceptance.     Dental advisory given  Plan Discussed with:   Anesthesia Plan Comments:         Anesthesia Quick Evaluation

## 2024-01-04 ENCOUNTER — Ambulatory Visit (HOSPITAL_COMMUNITY)

## 2024-01-04 ENCOUNTER — Other Ambulatory Visit: Payer: Self-pay

## 2024-01-04 ENCOUNTER — Telehealth: Payer: Self-pay | Admitting: Hematology

## 2024-01-04 ENCOUNTER — Encounter (HOSPITAL_COMMUNITY)
Admission: RE | Disposition: A | Discharge status: Home / Self Care | Payer: Self-pay | Source: Home / Self Care | Attending: Internal Medicine

## 2024-01-04 ENCOUNTER — Ambulatory Visit (HOSPITAL_COMMUNITY): Payer: Self-pay | Admitting: Anesthesiology

## 2024-01-04 ENCOUNTER — Ambulatory Visit (HOSPITAL_BASED_OUTPATIENT_CLINIC_OR_DEPARTMENT_OTHER): Payer: Self-pay | Admitting: Anesthesiology

## 2024-01-04 ENCOUNTER — Telehealth: Payer: Self-pay

## 2024-01-04 ENCOUNTER — Encounter (HOSPITAL_COMMUNITY): Payer: Self-pay | Admitting: Internal Medicine

## 2024-01-04 ENCOUNTER — Inpatient Hospital Stay

## 2024-01-04 ENCOUNTER — Ambulatory Visit (HOSPITAL_COMMUNITY)
Admission: RE | Admit: 2024-01-04 | Discharge: 2024-01-04 | Disposition: A | Discharge status: Home / Self Care | Attending: Internal Medicine | Admitting: Internal Medicine

## 2024-01-04 DIAGNOSIS — K219 Gastro-esophageal reflux disease without esophagitis: Secondary | ICD-10-CM | POA: Diagnosis not present

## 2024-01-04 DIAGNOSIS — G4733 Obstructive sleep apnea (adult) (pediatric): Secondary | ICD-10-CM | POA: Diagnosis not present

## 2024-01-04 DIAGNOSIS — I340 Nonrheumatic mitral (valve) insufficiency: Secondary | ICD-10-CM

## 2024-01-04 DIAGNOSIS — C50812 Malignant neoplasm of overlapping sites of left female breast: Secondary | ICD-10-CM | POA: Diagnosis not present

## 2024-01-04 DIAGNOSIS — Z87891 Personal history of nicotine dependence: Secondary | ICD-10-CM | POA: Insufficient documentation

## 2024-01-04 DIAGNOSIS — I10 Essential (primary) hypertension: Secondary | ICD-10-CM | POA: Diagnosis not present

## 2024-01-04 DIAGNOSIS — I272 Pulmonary hypertension, unspecified: Secondary | ICD-10-CM | POA: Insufficient documentation

## 2024-01-04 DIAGNOSIS — I2510 Atherosclerotic heart disease of native coronary artery without angina pectoris: Secondary | ICD-10-CM | POA: Insufficient documentation

## 2024-01-04 DIAGNOSIS — D519 Vitamin B12 deficiency anemia, unspecified: Secondary | ICD-10-CM | POA: Diagnosis not present

## 2024-01-04 DIAGNOSIS — I4719 Other supraventricular tachycardia: Secondary | ICD-10-CM | POA: Insufficient documentation

## 2024-01-04 DIAGNOSIS — I361 Nonrheumatic tricuspid (valve) insufficiency: Secondary | ICD-10-CM

## 2024-01-04 DIAGNOSIS — I071 Rheumatic tricuspid insufficiency: Secondary | ICD-10-CM | POA: Insufficient documentation

## 2024-01-04 DIAGNOSIS — Z79899 Other long term (current) drug therapy: Secondary | ICD-10-CM | POA: Diagnosis not present

## 2024-01-04 DIAGNOSIS — J910 Malignant pleural effusion: Secondary | ICD-10-CM | POA: Diagnosis not present

## 2024-01-04 DIAGNOSIS — Z1732 Human epidermal growth factor receptor 2 negative status: Secondary | ICD-10-CM | POA: Diagnosis not present

## 2024-01-04 DIAGNOSIS — E785 Hyperlipidemia, unspecified: Secondary | ICD-10-CM | POA: Insufficient documentation

## 2024-01-04 DIAGNOSIS — Z1721 Progesterone receptor positive status: Secondary | ICD-10-CM | POA: Diagnosis not present

## 2024-01-04 DIAGNOSIS — Z170 Estrogen receptor positive status [ER+]: Secondary | ICD-10-CM | POA: Diagnosis not present

## 2024-01-04 HISTORY — PX: TRANSESOPHAGEAL ECHOCARDIOGRAM (CATH LAB): EP1270

## 2024-01-04 LAB — COMPREHENSIVE METABOLIC PANEL WITH GFR
ALT: 18 U/L (ref 0–44)
AST: 37 U/L (ref 15–41)
Albumin: 3.9 g/dL (ref 3.5–5.0)
Alkaline Phosphatase: 95 U/L (ref 38–126)
Anion gap: 9 (ref 5–15)
BUN: 13 mg/dL (ref 8–23)
CO2: 23 mmol/L (ref 22–32)
Calcium: 9.3 mg/dL (ref 8.9–10.3)
Chloride: 107 mmol/L (ref 98–111)
Creatinine, Ser: 1.28 mg/dL — ABNORMAL HIGH (ref 0.44–1.00)
GFR, Estimated: 42 mL/min — ABNORMAL LOW (ref >60)
Glucose, Bld: 112 mg/dL — ABNORMAL HIGH (ref 70–99)
Potassium: 3.2 mmol/L — ABNORMAL LOW (ref 3.5–5.1)
Sodium: 139 mmol/L (ref 135–145)
Total Bilirubin: 0.9 mg/dL (ref 0.0–1.2)
Total Protein: 6.4 g/dL — ABNORMAL LOW (ref 6.5–8.1)

## 2024-01-04 LAB — CBC WITH DIFFERENTIAL/PLATELET
Abs Immature Granulocytes: 0.2 10*3/uL — ABNORMAL HIGH (ref 0.00–0.07)
Basophils Absolute: 0 10*3/uL (ref 0.0–0.1)
Basophils Relative: 1 %
Eosinophils Absolute: 0 10*3/uL (ref 0.0–0.5)
Eosinophils Relative: 0 %
HCT: 24.2 % — ABNORMAL LOW (ref 36.0–46.0)
Hemoglobin: 7.9 g/dL — ABNORMAL LOW (ref 12.0–15.0)
Immature Granulocytes: 5 %
Lymphocytes Relative: 27 %
Lymphs Abs: 1.2 10*3/uL (ref 0.7–4.0)
MCH: 30.7 pg (ref 26.0–34.0)
MCHC: 32.6 g/dL (ref 30.0–36.0)
MCV: 94.2 fL (ref 80.0–100.0)
Monocytes Absolute: 0.4 10*3/uL (ref 0.1–1.0)
Monocytes Relative: 9 %
Neutro Abs: 2.6 10*3/uL (ref 1.7–7.7)
Neutrophils Relative %: 58 %
Platelets: 150 10*3/uL (ref 150–400)
RBC: 2.57 MIL/uL — ABNORMAL LOW (ref 3.87–5.11)
RDW: 18.9 % — ABNORMAL HIGH (ref 11.5–15.5)
WBC: 4.4 10*3/uL (ref 4.0–10.5)
nRBC: 11.7 % — ABNORMAL HIGH (ref 0.0–0.2)

## 2024-01-04 LAB — POCT I-STAT, CHEM 8
BUN: 10 mg/dL (ref 8–23)
Calcium, Ion: 1.27 mmol/L (ref 1.15–1.40)
Chloride: 106 mmol/L (ref 98–111)
Creatinine, Ser: 1.5 mg/dL — ABNORMAL HIGH (ref 0.44–1.00)
Glucose, Bld: 118 mg/dL — ABNORMAL HIGH (ref 70–99)
HCT: 22 % — ABNORMAL LOW (ref 36.0–46.0)
Hemoglobin: 7.5 g/dL — ABNORMAL LOW (ref 12.0–15.0)
Potassium: 3.3 mmol/L — ABNORMAL LOW (ref 3.5–5.1)
Sodium: 140 mmol/L (ref 135–145)
TCO2: 22 mmol/L (ref 22–32)

## 2024-01-04 LAB — ECHO TEE

## 2024-01-04 LAB — SAMPLE TO BLOOD BANK

## 2024-01-04 LAB — ABO/RH: ABO/RH(D): B NEG

## 2024-01-04 SURGERY — TRANSESOPHAGEAL ECHOCARDIOGRAM (TEE) (CATHLAB)
Anesthesia: Monitor Anesthesia Care

## 2024-01-04 MED ORDER — SODIUM CHLORIDE 0.9% FLUSH: 3.0000 mL | Freq: Two times a day (BID) | INTRAVENOUS | Status: DC

## 2024-01-04 MED ORDER — PHENYLEPHRINE HCL (PRESSORS) 10 MG/ML IV SOLN
INTRAVENOUS | Status: DC | PRN
  Administered 2024-01-04 (×3): 80 ug via INTRAVENOUS

## 2024-01-04 MED ORDER — SODIUM CHLORIDE 0.9% FLUSH: 3.0000 mL | INTRAVENOUS | Status: DC | PRN

## 2024-01-04 MED ORDER — SODIUM CHLORIDE 0.9 % IV SOLN
INTRAVENOUS | Status: DC | PRN
Start: 2024-01-04 — End: 2024-01-04

## 2024-01-04 MED ORDER — PROPOFOL 10 MG/ML IV BOLUS
INTRAVENOUS | Status: DC | PRN
  Administered 2024-01-04: 60 mg via INTRAVENOUS

## 2024-01-04 MED ORDER — LIDOCAINE HCL (CARDIAC) PF 100 MG/5ML IV SOSY
PREFILLED_SYRINGE | INTRAVENOUS | Status: DC | PRN
  Administered 2024-01-04: 80 mg via INTRATRACHEAL

## 2024-01-04 MED ORDER — PROPOFOL 500 MG/50ML IV EMUL
INTRAVENOUS | Status: DC | PRN
  Administered 2024-01-04: 100 ug/kg/min via INTRAVENOUS

## 2024-01-04 NOTE — Anesthesia Postprocedure Evaluation (Signed)
 Anesthesia Post Note  Patient: Miguel Medal  Procedure(s) Performed: TRANSESOPHAGEAL ECHOCARDIOGRAM     Patient location during evaluation: Endoscopy Anesthesia Type: MAC Level of consciousness: awake and alert Pain management: pain level controlled Vital Signs Assessment: post-procedure vital signs reviewed and stable Respiratory status: spontaneous breathing, nonlabored ventilation, respiratory function stable and patient connected to nasal cannula oxygen Cardiovascular status: blood pressure returned to baseline and stable Postop Assessment: no apparent nausea or vomiting Anesthetic complications: no  No notable events documented.  Last Vitals:  Vitals:   01/04/24 0840 01/04/24 0850  BP: (P) 135/63 135/63  Pulse:  (!) 59  Resp:  14  Temp:    SpO2:  95%    Last Pain:  Vitals:   01/04/24 0818  PainSc: 0-No pain                 Rosalita Combe

## 2024-01-04 NOTE — Interval H&P Note (Signed)
 History and Physical Interval Note:  01/04/2024 7:25 AM  Madison Clements  has presented today for surgery, with the diagnosis of SEVERE TRICUSPID REGURGITATION.  The various methods of treatment have been discussed with the patient and family. After consideration of risks, benefits and other options for treatment, the patient has consented to  Procedure(s): TRANSESOPHAGEAL ECHOCARDIOGRAM (N/A) as a surgical intervention.  The patient's history has been reviewed, patient examined, no change in status, stable for surgery.  I have reviewed the patient's chart and labs.  Questions were answered to the patient's satisfaction.     Chikita Dogan A Jissel Slavens

## 2024-01-04 NOTE — Progress Notes (Signed)
 Dr Paulita Boss informed of HGB 7.5 today.  ASA stopped on discharge.  Pt has PET scan 4/28 and sees oncologist on May 5th.  Instructed to seek medical attention sooner if feels increasing weakness, dizzyness or SOB.

## 2024-01-04 NOTE — Transfer of Care (Signed)
 Immediate Anesthesia Transfer of Care Note  Patient: Madison Clements  Procedure(s) Performed: TRANSESOPHAGEAL ECHOCARDIOGRAM  Patient Location: PACU  Anesthesia Type:MAC  Level of Consciousness: awake, alert , and oriented  Airway & Oxygen Therapy: Patient Spontanous Breathing and Patient connected to face mask oxygen  Post-op Assessment: Report given to RN and Post -op Vital signs reviewed and stable  Post vital signs: Reviewed and stable  Last Vitals:  Vitals Value Taken Time  BP    Temp    Pulse 62 01/04/24 0818  Resp 34 01/04/24 0818  SpO2 92 % 01/04/24 0818  Vitals shown include unfiled device data.  Last Pain: There were no vitals filed for this visit.       Complications: No notable events documented.

## 2024-01-04 NOTE — CV Procedure (Addendum)
    TRANSESOPHAGEAL ECHOCARDIOGRAM   NAME:  Madison Clements    MRN: [id number] DOB:  09/21/1945    ADMIT DATE: 01/04/2024  INDICATIONS: Tricuspid regurgitation  PROCEDURE:   Informed consent was obtained prior to the procedure. The risks, benefits and alternatives for the procedure were discussed and the patient comprehended these risks.  Risks include, but are not limited to, cough, sore throat, vomiting, nausea, somnolence, esophageal and stomach trauma or perforation, bleeding, low blood pressure, aspiration, pneumonia, infection, trauma to the teeth and death.    Procedural time out performed. The oropharynx was anesthetized with topical 1% benzocaine.    Anesthesia was administered by Dr. Darline Eis and team.   The patient's heart rate, blood pressure, and oxygen saturation are monitored continuously during the procedure.   The transesophageal probe was inserted in the esophagus and stomach without difficulty and multiple views were obtained.   COMPLICATIONS:    There were no immediate complications.  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  Further characterization in full reporting. Incidental pleural effusion noted.  Full report to follow. Further management per primary team.   Gloriann Larger, MD [place]  [facility]  8:22 AM  Review her effusion with patient. Reviewed her low Hgb- she notes no bleeding diathesis.  Has f/u with pulmonology. Has PET scan then f/up with hematology/oncology. Will send message to her cardiologist, pulmonologist, and oncologist.  Hold ASA for now  Gloriann Larger, MD FASE [facility] Cardiologist [facility]  [address] [place], [address] [place], [place] [address] [phone]  8:38 AM

## 2024-01-04 NOTE — Telephone Encounter (Signed)
 Received telephone call from patient's sister, Darcey Earthly with the patient on speaker phone.  Sister reports patient had TEE procedure today. Sister states patient's hbg was 7.5. Sister reports patient is very weak. Sister states they were advised to make Dr. Maryalice Smaller aware of patient's hbg level. Sister inquiring if patient should have hbg rechecked here at [facility] and also inquiring if patient needs blood transfusion?  Let sister know that I would forward her message to Dr. Maryalice Smaller and we will follow-up once Dr. Maryalice Smaller advises further.

## 2024-01-04 NOTE — Progress Notes (Signed)
  Echocardiogram Echocardiogram Transesophageal has been performed.  Dione Franks 01/04/2024, 8:29 AM

## 2024-01-05 ENCOUNTER — Telehealth: Admitting: Internal Medicine

## 2024-01-05 ENCOUNTER — Ambulatory Visit: Admitting: Internal Medicine

## 2024-01-05 ENCOUNTER — Other Ambulatory Visit: Payer: Self-pay | Admitting: Nurse Practitioner

## 2024-01-05 ENCOUNTER — Other Ambulatory Visit: Payer: Self-pay

## 2024-01-05 DIAGNOSIS — J910 Malignant pleural effusion: Secondary | ICD-10-CM | POA: Diagnosis not present

## 2024-01-05 DIAGNOSIS — D519 Vitamin B12 deficiency anemia, unspecified: Secondary | ICD-10-CM

## 2024-01-05 DIAGNOSIS — D649 Anemia, unspecified: Secondary | ICD-10-CM

## 2024-01-05 DIAGNOSIS — J90 Pleural effusion, not elsewhere classified: Secondary | ICD-10-CM

## 2024-01-05 DIAGNOSIS — C50812 Malignant neoplasm of overlapping sites of left female breast: Secondary | ICD-10-CM

## 2024-01-05 DIAGNOSIS — I071 Rheumatic tricuspid insufficiency: Secondary | ICD-10-CM

## 2024-01-05 LAB — PREPARE RBC (CROSSMATCH)

## 2024-01-05 NOTE — Progress Notes (Signed)
 IOV 2022 Chief complaint: Follow-up for pulmonary hypertension  HPI: 78 year old with history of pulmonary hypertension, hyperlipidemia, skin cancer Referred for evaluation of pulmonary hypertension  This was initially diagnosed in 2010 in [place] .  Previous echocardiogram with RVSP in the 30-40 range.  She was evaluated by pulmonologist in 2014 with normal PFTs and was told that she had mild OSA on sleep study that did not require treatment. More recently she has been having increasing dyspnea on exertion, lower extremity edema.  She has osteoarthritis.  Denies morning stiffness, difficulty swallowing, Raynaud's syndrome.  Recent echocardiogram with elevation in RVSP and has been referred to pulmonary for further evaluation.  She is also being evaluated for petechial rash by primary care  She follows with Dr. Micael Adas from cardiology Seen by Dr. Elvin Hammer, GI in September 2021 with work-up for elevated transaminases with ultrasound showing hepatic steatosis Work-up for alpha-1 antitrypsin, hepatitis, autoimmune hepatitis is negative.  Pets: No pets Occupation: Retired Associate Professor for [employer] Exposures: No known exposures.  No mold, hot tub, Jacuzzi.  No feather pillows or comforters Smoking history: 5-pack-year smoker.  Quit in 1998 Travel history: Previously lived in [place] , [place] .  No significant recent travel Relevant family history: No significant family history of lung disease  Interim history: Started on CPAP in November 2021 for diagnosis of moderate sleep apnea. She is using it every day but sometimes not more than 4 hours.  Has issues with mask fit and sleep quality while on CPAP. PFTs: 06/04/2020 FVC 2.58 [106%], FEV1 2.07 [115%], F/F 80, TLC 5.14 [111%], DLCO 14.57 [84%] Normal test  Labs: CMP from 01/14/2020-significant for slightly elevated transaminases and total bilirubin. 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  She is not comfortable with the mask and will get a mask fitting with DME.  If there are persistent issues then consider dental device. Discussed with Dr. Micael Adas, cardiology.  We will repeat echocardiogram after 3 to 6 months of adequate therapy. If pulmonary hypertension is persistent then get right heart cath  Plan/Recommendations: Continue CPAP at 9 cm Mask fitting  OV 12/22/2023  Subjective:  Patient ID: Ernestine Heads, female , DOB: 01/05/1946 , age 59 y.o. , MRN: [id number] , ADDRESS: [address] [place] [address] PCP Luevenia Saha, MD Patient Care Team: Luevenia Saha, MD as PCP - General (Family Medicine) Jacqueline Matsu, MD as PCP - Cardiology (Cardiology) Jacqueline Matsu, MD as Consulting Physician (Cardiology) Rexene Catching, MD as Consulting Physician (Ophthalmology) Dorthey Gave, PA-C as Physician Assistant (Dermatology) Dareen Ebbing, MD as Consulting Physician (General Surgery) Sonja [place], MD as Consulting Physician (Hematology) Colie Dawes, MD as Attending Physician (Radiation Oncology) Myrle Aspen, [facility] (Inactive) (Pharmacist)  This Provider for this visit: Treatment Team:  Attending Provider: Mannam, Praveen, MD    12/22/2023 -   Chief Complaint  Patient presents with   Pulmonary Consult    Referred by Dr. Karma Oz for  eval of recent PNA and left pleural effusion.  She has had persistent cough since  Feb 2025. Cough feels like it's productive but it's not. She has DOE with walking short distances such as parking lot to our lobby today.      HPI Jonah Kiylee Thoreson 78 y.o. -new consult for left pleural effusion referred by Dr. Starr Eddy  Over 3 years ago she saw Dr. Provine Mannam for potential pulmonary hypertension evaluation which she states was because of her sleep apnea and since using CPAP it is no longer present in the recent echocardiograms.  She is here with the sister.  She lives in the [place] area.  She says she is doing well except for the fact that routine echocardiogram showed severe tricuspid regurgitation and she is having a TEE next week.  She has baseline dyspnea on exertion.  For the current issues pleural effusion left side  It appears that mid February 2025 she had flulike symptoms possibly 1 classic flu though never tested with chills fever body ache.  Was a self-limiting illness.  In she was left with a residual cough however.  While this cough persisted then by mid-to-late February 2025 she had symptoms again of fever and chills and this time on December 05, 2023 she had a chest x-ray that I personally visualized and showed a small pleural effusion.  She was then given Z-Pak which then resolved her fever but she states she still coughs.  Currently ongoing cough for over 5 weeks or 2 months.  Is a dry cough.  She says is a lot of postnasal drainage and she clears her throat there is no wheezing.  During all this time she has got up unintentional weight loss of 13 pounds.  Other pleural effusion related history - Baseline creatinine around 1 mg percent but starting December 12, 2023 with supper 2.74 mg percent -Echo findings of tricuspid regurgitation that is severe in March 2025 -No prior TSH -No prior QuantiFERON gold - Last BNP in 2021 -She has a history of left-sided breast cancer with status post radiation   US  thora at bedside in office 12/22/2023  -There is  definitely a small to moderate left-sided pleural effusion that is free-flowing.  On the right side there is no effusion.  ECHO March 2025 per Dr Micael Adas  Echo showed normal pumping function of the heart muscle with increased stiffness of the heart called diastolic dysfunction normal for age.  The right ventricle is mildly enlarged with mild pulmonary hypertension.  There is moderate to severe tricuspid regurgitation.  Cath 07/02/2023 showed no evidence of pulmonary hypertension.  I would like her to be seen by an extender to get set up for TEE to assess degree of tricuspid regurgitation as it is read out is very eccentric and may be very sever       OV 01/05/2024  Subjective:  Patient ID: Ernestine Heads, female , DOB: 1946/07/10 , age 47 y.o. , MRN: [id number] , ADDRESS: [address] [place] [address] PCP Luevenia Saha, MD Patient Care Team: Luevenia Saha, MD as PCP - General (Family Medicine) Jacqueline Matsu, MD as PCP - Cardiology (Cardiology) Jacqueline Matsu, MD as Consulting Physician (Cardiology) Rexene Catching, MD as Consulting Physician (Ophthalmology) Dorthey Gave, PA-C as Physician Assistant (Dermatology) Dareen Ebbing, MD as Consulting Physician (General Surgery) Sonja , MD as Consulting Physician (Hematology) Colie Dawes, MD as Attending Physician (Radiation Oncology) Myrle Aspen,  RPH (Inactive) (Pharmacist)  This Provider for this visit: Treatment Team:  Attending Provider: Maire Scot, MD  Type of visit: Video Virtual Visit Identification of patient Kaelani Kendrick with [birth year] and MRN [id number] - 2 person identifier Risks: Risks, benefits, limitations of telephone visit explained. Patient understood and verbalized agreement to proceed Anyone else on call: her sister Patient location: hre home This provider location: [address], [address]; [place]; [place] [address]. [facility]. [phone]   01/05/2024 -  Pleural Effusiin    HPI Liesl Dionne Knoop 78 y.o. -here to discuss results of left thoracentesis which she underwent 12/25/2023 here to discuss results.  It is a mild exudate with an LDH of 121 with important thing is that there are malignant cells present consistent with breast cancer.  I immediately alerted Dr. Grayland Le her oncologist and there is a treatment plan and staging plan in progress based on patient on the video visit and her sister and review of the external records.  In addition she underwent TEE that showed severe tricuspid regurgitation.  She is also mildly anemic with a hemoglobin 7.9 g% and feeling fatigued therefore blood transfusions in order in the next few days.  This is all being coordinated by the oncologist.  The current pleural effusion is small.  I indicated that very likely with breast cancer treatment it will improve but if it does not improve and she has persistent malignant pleural effusion a Pleurx catheter would be in order.  She decided that she would follow-up with oncology and return to the pulmonary clinic only if needed.  I was aligned with this and we took a shared decision making on that.      Clinical History: Pulmonary hypertension, hyperlipidemia, skin cancer  Specimen Submitted:  A. PLEURAL FLUID, LEFT, THORACENTESIS:    FINAL MICROSCOPIC DIAGNOSIS:  - Malignant cells present  - Adenocarcinoma consistent with breast primary   SPECIMEN ADEQUACY:  Satisfactory for evaluation      PFT     Latest Ref Rng & Units 06/04/2020    9:53 AM  PFT Results  FVC-Pre L 2.58   FVC-Predicted Pre % 106   FVC-Post L 2.51   FVC-Predicted Post % 104   Pre FEV1/FVC % % 80   Post FEV1/FCV % % 86   FEV1-Pre L 2.07   FEV1-Predicted Pre % 115   FEV1-Post L 2.17   DLCO uncorrected ml/min/mmHg 14.57   DLCO UNC% % 84   DLCO corrected ml/min/mmHg 14.57   DLCO COR %Predicted % 84   DLVA Predicted % 78   TLC L 5.14   TLC % Predicted % 111    RV % Predicted % 116        LAB RESULTS last 96 hours ECHO TEE Result Date: 01/04/2024    TRANSESOPHOGEAL ECHO REPORT   Patient Name:   KAMALEI ROEDER Masten Date of Exam: 01/04/2024 Medical Rec #:  [id number]            Height:       62.0 in Accession #:    [id number]           Weight:       142.8 lb Date of Birth:  12-30-1945            BSA:          1.657 m Patient Age:    78 years             BP:  148/52 mmHg Patient Gender: F                    HR:           67 bpm. Exam Location:  Outpatient Procedure: Transesophageal Echo, 3D Echo, Color Doppler and Cardiac Doppler            (Both Spectral and Color Flow Doppler were utilized during            procedure). Indications:     tricuspid regurgitation  History:         Patient has prior history of Echocardiogram examinations, most                  recent 11/13/2023. CAD, Cancer; Risk Factors:Hypertension,                  Dyslipidemia and Sleep Apnea.  Sonographer:     Dione Franks RDCS Referring Phys:  [phone] TRACI R TURNER Diagnosing Phys: Gloriann Larger MD PROCEDURE: After discussion of the risks and benefits of a TEE, an informed consent was obtained from the patient. The transesophogeal probe was passed without difficulty through the esophogus of the patient. Imaged were obtained with the patient in a left lateral decubitus position. Sedation performed by different physician. The patient was monitored while under deep sedation. Anesthestetic sedation was provided intravenously by Anesthesiology: 238mg  of Propofol , 80mg  of Lidocaine . The patient developed no complications during the procedure.  IMPRESSIONS  1. Left ventricular ejection fraction, by estimation, is 65 to 70%. The left ventricle has normal function.  2. Right ventricular systolic function is normal. The right ventricular size is moderately enlarged.  3. No left atrial/left atrial appendage thrombus was detected. The LAA emptying velocity was 56 cm/s.  4. Right atrial size  was moderately dilated.  5. The mitral valve is normal in structure. Mild mitral valve regurgitation. No evidence of mitral stenosis.  6. Severe, eccentric, mid-ventricular functional, tricuspid regurgitation. Dilated coronary sinus. Septolateral gap 4 mm. No abnormal chordal desnity. Jet originates anteroseptal and takes a posterior course. Tri-leaflet valve. Image quality is limited by LASH; best working view is 100 gastric view. Maximal 2D VC width 0.98 cm. TEER Score 1; favorable tTEER anatomy; difficult imaging windows. Tricuspid valve regurgitation is severe.  7. The aortic valve is tricuspid. Aortic valve regurgitation is not visualized. No aortic stenosis is present.  8. 3D performed of the tricuspid valve and demonstrates Severe, eccentric tricuspid regurgitation. 3D VCA 1.0 cm2. FINDINGS  Left Ventricle: Left ventricular ejection fraction, by estimation, is 65 to 70%. The left ventricle has normal function. The left ventricular internal cavity size was normal in size. Right Ventricle: The right ventricular size is moderately enlarged. No increase in right ventricular wall thickness. Right ventricular systolic function is normal. Left Atrium: Left atrial size was normal in size. No left atrial/left atrial appendage thrombus was detected. The LAA emptying velocity was 56 cm/s. Right Atrium: Right atrial size was moderately dilated. Pericardium: There is no evidence of pericardial effusion. Mitral Valve: The mitral valve is normal in structure. Mild mitral valve regurgitation. No evidence of mitral valve stenosis. Tricuspid Valve: Severe, eccentric, mid-ventricular functional, tricuspid regurgitation. Dilated coronary sinus. Septolateral gap 4 mm. No abnormal chordal desnity. Jet originates anteroseptal and takes a posterior course. Tri-leaflet valve. Image quality is limited by LASH; best working view is 100 gastric view. Maximal 2D VC width 0.98 cm. TEER Score 1; favorable tTEER anatomy; difficult imaging  windows. The tricuspid valve  is normal in structure. Tricuspid valve regurgitation is severe. No evidence of tricuspid stenosis. The flow in the hepatic veins is blunted (decreased) during ventricular systole. Aortic Valve: The aortic valve is tricuspid. Aortic valve regurgitation is not visualized. No aortic stenosis is present. Pulmonic Valve: The pulmonic valve was normal in structure. Pulmonic valve regurgitation is not visualized. No evidence of pulmonic stenosis. Aorta: The aortic root, ascending aorta, aortic arch and descending aorta are all structurally normal, with no evidence of dilitation or obstruction. Venous: A normal flow pattern is recorded from the left upper pulmonary vein, the right upper pulmonary vein, the left lower pulmonary vein and the right lower pulmonary vein. IAS/Shunts: The interatrial septum appears to be lipomatous. No atrial level shunt detected by color flow Doppler. Additional Comments: 3D was performed not requiring image post processing on an independent workstation and was indeterminate. TRICUSPID VALVE TR Peak grad:   40.4 mmHg TR Vmax:        318.00 cm/s Gloriann Larger MD Electronically signed by Gloriann Larger MD Signature Date/Time: 01/04/2024/1:26:46 PM    Final    EP STUDY Result Date: 01/04/2024 See surgical note for result.        has a past medical history of Allergy (1990), Arthritis, Atypical mole (02/24/2015), Atypical mole (01/20/2022), Atypical mole (01/20/2022), Atypical nevi (02/24/2015), Atypical nevi (08/25/2015), Atypical nevi (08/25/2015), Atypical nevi (02/23/2016), Atypical nevi (02/23/2016), Atypical nevi (02/23/2016), Atypical nevi (08/23/2016), Atypical nevi (07/25/2018), Atypical nevi (01/22/2019), Atypical nevi (01/22/2019), Atypical nevi (08/06/2019), Atypical nevi (08/06/2019), Blood transfusion without reported diagnosis, Breast cancer (HCC) (2022), CAD (coronary artery disease), native coronary artery (02/07/2019), Family  history of breast cancer (08/19/2021), Family history of pancreatic cancer (08/19/2021), Fatty liver (05/21/2020), Gastric polyps (10/02/2018), GERD (gastroesophageal reflux disease) (10/02/2018), History of melanoma (10/02/2018), Hyperlipidemia, Hypertension, Melanoma (HCC) (2012), Osteopenia, Personal history of radiation therapy, Polyp of colon, adenomatous (10/02/2018), PONV (postoperative nausea and vomiting), Pulmonary hypertension, primary (HCC) (10/02/2018), SCCA (squamous cell carcinoma) of skin (09/18/2017), Sleep apnea, Sleep apnea (05/26/2020), and Tricuspid regurgitation.   reports that she quit smoking about 27 years ago. Her smoking use included cigarettes. She started smoking about 37 years ago. She has a 2.5 pack-year smoking history. She has never used smokeless tobacco.  Past Surgical History:  Procedure Laterality Date   ABDOMINAL HYSTERECTOMY  1990   APPENDECTOMY  1962   BLADDER SUSPENSION  1990   BREAST LUMPECTOMY WITH RADIOACTIVE SEED LOCALIZATION Left 09/09/2021   Procedure: LEFT BREAST LUMPECTOMY WITH RADIOACTIVE SEED LOCALIZATION X2;  Surgeon: Dareen Ebbing, MD;  Location: [facility];  Service: General;  Laterality: Left;   COLONOSCOPY  2016   in [place]-hx of polyps   EYE SURGERY  2013 torn retina laser   MENISCUS REPAIR Right 2001   right knee   RIGHT HEART CATH AND CORONARY ANGIOGRAPHY N/A 06/20/2023   Procedure: RIGHT HEART CATH AND CORONARY ANGIOGRAPHY;  Surgeon: Darlis Eisenmenger, MD;  Location: [facility] INVASIVE CV LAB;  Service: Cardiovascular;  Laterality: N/A;   thumb surgery Left    TONSILLECTOMY AND ADENOIDECTOMY     TRANSESOPHAGEAL ECHOCARDIOGRAM (CATH LAB) N/A 01/04/2024   Procedure: TRANSESOPHAGEAL ECHOCARDIOGRAM;  Surgeon: Jann Melody, MD;  Location: [facility] INVASIVE CV LAB;  Service: Cardiovascular;  Laterality: N/A;   tuabl ligation     TUBAL LIGATION  1975   WISDOM TOOTH EXTRACTION      Allergies  Allergen Reactions   Morphine Rash  and Nausea And Vomiting   Metoprolol  Other (See Comments)   Nebivolol   Other (See Comments)    bradycardia   Morphine And Codeine Hives, Dermatitis and Rash    Immunization History  Administered Date(s) Administered   Fluad Quad(high Dose 65+) 07/16/2019, 07/09/2021   Fluad Trivalent(High Dose 65+) 07/14/2023   Influenza, High Dose Seasonal PF 06/04/2020   PFIZER(Purple Top)SARS-COV-2 Vaccination 09/27/2019, 10/18/2019, 07/03/2020   Pneumococcal Conjugate-13 07/18/2013   Pneumococcal Polysaccharide-23 09/11/2009   Zoster Recombinant(Shingrix ) 06/24/2020    Family History  Problem Relation Age of Onset   Arthritis Mother    High blood pressure Mother    Hyperlipidemia Mother    Hypertension Mother    Stroke Mother    Varicose Veins Mother    Heart attack Father    Heart disease Father    High blood pressure Father    Hypertension Father    Arthritis Sister    Diabetes Sister    High Cholesterol Sister    High blood pressure Sister    Breast cancer Sister 68   Varicose Veins Sister    Pancreatic cancer Maternal Aunt        dx early 80s   Heart disease Maternal Uncle    Heart disease Maternal Uncle    Stroke Paternal Aunt    Early death Maternal Grandmother        Stroke at 25   High blood pressure Maternal Grandmother    Hypertension Maternal Grandmother    Stroke Maternal Grandmother    Asthma Maternal Grandfather    Kidney disease Maternal Grandfather    Arthritis Paternal Grandmother    Heart disease Paternal Grandmother    High blood pressure Paternal Grandmother    Hypertension Paternal Grandmother    Kidney disease Paternal Grandfather    Asthma Paternal Grandfather    COPD Paternal Grandfather    Prostate cancer Cousin        paternal cousins x2; dx after 53   Colon polyps Neg Hx    Colon cancer Neg Hx    Esophageal cancer Neg Hx    Rectal cancer Neg Hx    Stomach cancer Neg Hx      Current Outpatient Medications:    anastrozole  (ARIMIDEX ) 1 MG  tablet, Take 1 tablet (1 mg total) by mouth daily., Disp: 30 tablet, Rfl: 1   diltiazem  (CARDIZEM  CD) 300 MG 24 hr capsule, Take 1 capsule (300 mg total) by mouth daily., Disp: 90 capsule, Rfl: 3   ezetimibe  (ZETIA ) 10 MG tablet, Take 1 tablet (10 mg total) by mouth daily., Disp: 90 tablet, Rfl: 3   omeprazole  (PRILOSEC) 20 MG capsule, Take 20 mg by mouth daily., Disp: , Rfl:    Prenatal Vit-Fe Fumarate-FA (PRENATAL VITAMIN PO), Take by mouth daily. Prenatal w/folic acid po daily, Disp: , Rfl:    Probiotic Product (ADVANCED PROBIOTIC PO), Take 1 capsule by mouth daily., Disp: , Rfl:    rosuvastatin  (CRESTOR ) 20 MG tablet, Take 1 tablet (20 mg total) by mouth daily., Disp: 90 tablet, Rfl: 3      Objective:   There were no vitals filed for this visit.  Estimated body mass index is 26.12 kg/m as calculated from the following:   Height as of 01/02/24: 5\' 2"  (1.575 m).   Weight as of 01/02/24: 142 lb 12.8 oz (64.8 kg).  @WEIGHTCHANGE @  There were no vitals filed for this visit.   Physical Exam   General: No distress. [place]k well O2 at rest: no Cane present: no Sitting in wheel chair: no Frail: no Obese: no Neuro: Alert  and Oriented x 3. GCS 15. Speech normal Psych: Pleasant Resp:, No overt respiratory distress        Assessment:       ICD-10-CM   1. 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 Pleural effusion, left  J90     3. 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 Pleural effusion, left  J90         Follow with oncology Return if needed for pleurx if the fluid gets worse but oncology can also arrange this with IR   FOLLOWUP Return if symptoms worsen or fail to improve.    SIGNATURE    Dr. Maire Scot, M.D., F.C.C.P,  Pulmonary and Critical Care Medicine Staff Physician, [facility] Director - Interstitial Lung Disease  Program   Pulmonary Fibrosis [facility] Network at [facility] [place], [place], [address]  Pager: [phone], If no answer or between  15:00h - 7:00h: call [phone] Telephone: [phone]  2:51 PM 01/05/2024

## 2024-01-05 NOTE — Patient Instructions (Addendum)
 ICD-10-CM   1. 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 Pleural effusion, left  J90         Follow with oncology Return if needed for pleurx if the fluid gets worse but oncology can also arrange this with IR

## 2024-01-06 ENCOUNTER — Inpatient Hospital Stay

## 2024-01-06 DIAGNOSIS — D649 Anemia, unspecified: Secondary | ICD-10-CM

## 2024-01-06 DIAGNOSIS — Z170 Estrogen receptor positive status [ER+]: Secondary | ICD-10-CM | POA: Diagnosis not present

## 2024-01-06 DIAGNOSIS — D519 Vitamin B12 deficiency anemia, unspecified: Secondary | ICD-10-CM

## 2024-01-06 DIAGNOSIS — J910 Malignant pleural effusion: Secondary | ICD-10-CM | POA: Diagnosis not present

## 2024-01-06 DIAGNOSIS — Z1721 Progesterone receptor positive status: Secondary | ICD-10-CM | POA: Diagnosis not present

## 2024-01-06 DIAGNOSIS — Z1732 Human epidermal growth factor receptor 2 negative status: Secondary | ICD-10-CM | POA: Diagnosis not present

## 2024-01-06 DIAGNOSIS — C50812 Malignant neoplasm of overlapping sites of left female breast: Secondary | ICD-10-CM

## 2024-01-06 MED ORDER — ACETAMINOPHEN 325 MG PO TABS
650.0000 mg | ORAL_TABLET | Freq: Once | ORAL | Status: AC
  Administered 2024-01-06: 650 mg via ORAL
  Filled 2024-01-06: qty 2

## 2024-01-06 MED ORDER — SODIUM CHLORIDE 0.9% IV SOLUTION
250.0000 mL | INTRAVENOUS | Status: DC
  Administered 2024-01-06: 250 mL via INTRAVENOUS

## 2024-01-06 MED ORDER — DIPHENHYDRAMINE HCL 25 MG PO CAPS
25.0000 mg | ORAL_CAPSULE | Freq: Once | ORAL | Status: AC
  Administered 2024-01-06: 25 mg via ORAL
  Filled 2024-01-06: qty 1

## 2024-01-06 NOTE — Patient Instructions (Signed)

## 2024-01-06 NOTE — Progress Notes (Signed)
 Pt here for blood transfusion.  Noted order for 1 unit of blood and confirmed with blood bank that pt has 1 unit available.  Pt stated she was told by Dr. Candise Chambers nurse that pt will be receiving 2 units of blood on Saturday. Hgb  7.9  on  4/24.    Pt completed 1 unit of blood transfusion without difficulty.  AVS given. Informed pt that Dr. Maryalice Smaller will be notified of above issue on Monday.   Pt and sister voiced understanding. Pt has  PET scan scheduled for  Mon  01/08/24  and f/u visit on  5/5 with Dr. Maryalice Smaller.

## 2024-01-07 ENCOUNTER — Encounter: Payer: Self-pay | Admitting: Cardiology

## 2024-01-07 DIAGNOSIS — Z79899 Other long term (current) drug therapy: Secondary | ICD-10-CM

## 2024-01-08 ENCOUNTER — Ambulatory Visit (HOSPITAL_COMMUNITY)
Admission: RE | Admit: 2024-01-08 | Discharge: 2024-01-08 | Disposition: A | Discharge status: Home / Self Care | Source: Ambulatory Visit | Attending: Hematology | Admitting: Hematology

## 2024-01-08 ENCOUNTER — Other Ambulatory Visit: Payer: Self-pay

## 2024-01-08 ENCOUNTER — Encounter: Payer: Self-pay | Admitting: Hematology

## 2024-01-08 DIAGNOSIS — C50812 Malignant neoplasm of overlapping sites of left female breast: Secondary | ICD-10-CM | POA: Insufficient documentation

## 2024-01-08 DIAGNOSIS — Z170 Estrogen receptor positive status [ER+]: Secondary | ICD-10-CM | POA: Diagnosis not present

## 2024-01-08 DIAGNOSIS — C50412 Malignant neoplasm of upper-outer quadrant of left female breast: Secondary | ICD-10-CM | POA: Diagnosis not present

## 2024-01-08 LAB — TYPE AND SCREEN
ABO/RH(D): B NEG
Antibody Screen: NEGATIVE
Unit division: 0

## 2024-01-08 LAB — BPAM RBC
Blood Product Expiration Date: 202505292359
ISSUE DATE / TIME: 202504260845
Unit Type and Rh: 1700

## 2024-01-08 LAB — GLUCOSE, CAPILLARY: Glucose-Capillary: 96 mg/dL (ref 70–99)

## 2024-01-08 MED ORDER — FLUDEOXYGLUCOSE F - 18 (FDG) INJECTION
7.1000 | Freq: Once | INTRAVENOUS | Status: AC
  Administered 2024-01-08: 7.1 via INTRAVENOUS

## 2024-01-10 NOTE — Telephone Encounter (Signed)
 Spoke with pt regarding Dr. Charl Concha suggestions. Pt aware to take 40 mg Lasix  daily for 3 days and then 20 mg daily as needed for lower extremity edema or weight gain greater than 3 pounds in 1 day or 5 pounds in a week . Pt aware of BMET to be drawn in 1 week. BMET ordered and released. Pt aware to follow up about ASA with oncologist. Pt verbalized understanding. All questions if any were answered.

## 2024-01-11 ENCOUNTER — Other Ambulatory Visit: Payer: Self-pay

## 2024-01-12 NOTE — Assessment & Plan Note (Addendum)
 Stage IA, pT2, cN0, ER+/PR+/HER2-, Grade 2, RS 21, malignant pleural effusion in 12/2023 -she was under short-term f/u for left breast asymmetry. Biopsy 08/11/21 confirmed invasive mammary carcinoma. -left lumpectomy on 09/09/21 by Dr. 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 No nodes were removed due to her age. -she received adjuvant RT, completed in 11/2021 -she started adjuvant tamoxifen  in 12/2021, but could not tolerate. She subsequently tried anastrozole  and still can not tolerate. She stopped in 04/2022 -Screening mammogram in May 2024 were negative. - She developed pleural effusion in April 2025, cytology unfortunately showed malignant cells, consistent with breast primary. ERand PR moderately positive, Her2 negative (IHC 0) -FDG PET 01/08/2024 was negative for hypermetabolic lesion or hypermetabolism around the pleura.  I recommend [name] scan for further evaluation. -she has started anastrozole  in 12/2023

## 2024-01-15 ENCOUNTER — Inpatient Hospital Stay

## 2024-01-15 ENCOUNTER — Ambulatory Visit

## 2024-01-15 ENCOUNTER — Encounter: Payer: Self-pay | Admitting: Hematology

## 2024-01-15 ENCOUNTER — Inpatient Hospital Stay (HOSPITAL_BASED_OUTPATIENT_CLINIC_OR_DEPARTMENT_OTHER): Admitting: Hematology

## 2024-01-15 ENCOUNTER — Other Ambulatory Visit

## 2024-01-15 ENCOUNTER — Inpatient Hospital Stay: Attending: Hematology

## 2024-01-15 VITALS — BP 122/52 | HR 91 | Temp 98.4°F | Resp 19 | Ht 62.0 in | Wt 139.6 lb

## 2024-01-15 DIAGNOSIS — Z1732 Human epidermal growth factor receptor 2 negative status: Secondary | ICD-10-CM | POA: Insufficient documentation

## 2024-01-15 DIAGNOSIS — C50812 Malignant neoplasm of overlapping sites of left female breast: Secondary | ICD-10-CM

## 2024-01-15 DIAGNOSIS — Z79811 Long term (current) use of aromatase inhibitors: Secondary | ICD-10-CM | POA: Insufficient documentation

## 2024-01-15 DIAGNOSIS — Z170 Estrogen receptor positive status [ER+]: Secondary | ICD-10-CM

## 2024-01-15 DIAGNOSIS — J910 Malignant pleural effusion: Secondary | ICD-10-CM | POA: Diagnosis not present

## 2024-01-15 DIAGNOSIS — M8580 Other specified disorders of bone density and structure, unspecified site: Secondary | ICD-10-CM

## 2024-01-15 DIAGNOSIS — Z1721 Progesterone receptor positive status: Secondary | ICD-10-CM | POA: Diagnosis not present

## 2024-01-15 DIAGNOSIS — D519 Vitamin B12 deficiency anemia, unspecified: Secondary | ICD-10-CM | POA: Diagnosis not present

## 2024-01-15 DIAGNOSIS — I071 Rheumatic tricuspid insufficiency: Secondary | ICD-10-CM | POA: Insufficient documentation

## 2024-01-15 DIAGNOSIS — M810 Age-related osteoporosis without current pathological fracture: Secondary | ICD-10-CM

## 2024-01-15 DIAGNOSIS — Z923 Personal history of irradiation: Secondary | ICD-10-CM | POA: Insufficient documentation

## 2024-01-15 LAB — CBC WITH DIFFERENTIAL (CANCER CENTER ONLY)
Abs Immature Granulocytes: 0.07 10*3/uL (ref 0.00–0.07)
Basophils Absolute: 0 10*3/uL (ref 0.0–0.1)
Basophils Relative: 1 %
Eosinophils Absolute: 0 10*3/uL (ref 0.0–0.5)
Eosinophils Relative: 1 %
HCT: 32.1 % — ABNORMAL LOW (ref 36.0–46.0)
Hemoglobin: 11.1 g/dL — ABNORMAL LOW (ref 12.0–15.0)
Immature Granulocytes: 2 %
Lymphocytes Relative: 30 %
Lymphs Abs: 1.2 10*3/uL (ref 0.7–4.0)
MCH: 32.2 pg (ref 26.0–34.0)
MCHC: 34.6 g/dL (ref 30.0–36.0)
MCV: 93 fL (ref 80.0–100.0)
Monocytes Absolute: 0.3 10*3/uL (ref 0.1–1.0)
Monocytes Relative: 8 %
Neutro Abs: 2.3 10*3/uL (ref 1.7–7.7)
Neutrophils Relative %: 58 %
Platelet Count: 140 10*3/uL — ABNORMAL LOW (ref 150–400)
RBC: 3.45 MIL/uL — ABNORMAL LOW (ref 3.87–5.11)
RDW: 17 % — ABNORMAL HIGH (ref 11.5–15.5)
WBC Count: 4 10*3/uL (ref 4.0–10.5)
nRBC: 6.6 % — ABNORMAL HIGH (ref 0.0–0.2)

## 2024-01-15 LAB — CMP (CANCER CENTER ONLY)
ALT: 16 U/L (ref 0–44)
AST: 27 U/L (ref 15–41)
Albumin: 4.2 g/dL (ref 3.5–5.0)
Alkaline Phosphatase: 94 U/L (ref 38–126)
Anion gap: 9 (ref 5–15)
BUN: 17 mg/dL (ref 8–23)
CO2: 26 mmol/L (ref 22–32)
Calcium: 9.1 mg/dL (ref 8.9–10.3)
Chloride: 105 mmol/L (ref 98–111)
Creatinine: 1.3 mg/dL — ABNORMAL HIGH (ref 0.44–1.00)
GFR, Estimated: 42 mL/min — ABNORMAL LOW (ref >60)
Glucose, Bld: 113 mg/dL — ABNORMAL HIGH (ref 70–99)
Potassium: 3.2 mmol/L — ABNORMAL LOW (ref 3.5–5.1)
Sodium: 140 mmol/L (ref 135–145)
Total Bilirubin: 1 mg/dL (ref 0.0–1.2)
Total Protein: 6.8 g/dL (ref 6.5–8.1)

## 2024-01-15 LAB — CULTURE, FUNGUS WITHOUT SMEAR

## 2024-01-15 LAB — SAMPLE TO BLOOD BANK

## 2024-01-15 MED ORDER — CYANOCOBALAMIN 1000 MCG/ML IJ SOLN
1000.0000 ug | Freq: Once | INTRAMUSCULAR | Status: AC
  Administered 2024-01-15: 1000 ug via INTRAMUSCULAR
  Filled 2024-01-15: qty 1

## 2024-01-15 MED ORDER — ANASTROZOLE 1 MG PO TABS: 1.0000 mg | ORAL_TABLET | Freq: Every day | ORAL | 1 refills | Status: DC

## 2024-01-15 NOTE — Progress Notes (Signed)
 [facility]   Telephone:([phone] Fax:([phone]   Clinic Follow up Note   Patient Care Team: Madison Saha, MD as PCP - General (Family Medicine) Madison Matsu, MD as PCP - Cardiology (Cardiology) Madison Matsu, MD as Consulting Physician (Cardiology) Madison Catching, MD as Consulting Physician (Ophthalmology) Madison Gave, PA-C as Physician Assistant (Dermatology) Madison Ebbing, MD as Consulting Physician (General Surgery) Madison Carlisle, MD as Consulting Physician (Hematology) Madison Dawes, MD as Attending Physician (Radiation Oncology) Madison Clements, [facility] (Inactive) (Pharmacist)  Date of Service:  01/15/2024  CHIEF COMPLAINT: f/u of metastatic breast cancer   CURRENT THERAPY:  Anastrozole    Oncology History   Malignant neoplasm of overlapping sites of left breast in female, estrogen receptor positive (HCC) Stage IA, pT2, cN0, ER+/PR+/HER2-, Grade 2, RS 21, malignant pleural effusion in 12/2023 -she was under short-term f/u for left breast asymmetry. Biopsy 08/11/21 confirmed invasive mammary carcinoma. -left lumpectomy on 09/09/21 by Dr. Eli Clements showed 3.5 cm IDC and DCIS, margins uninvolved. No nodes were removed due to her age. -she received adjuvant RT, completed in 11/2021 -she started adjuvant tamoxifen  in 12/2021, but could not tolerate. She subsequently tried anastrozole  and still can not tolerate. She stopped in 04/2022 -Screening mammogram in May 2024 were negative. - She developed pleural effusion in April 2025, cytology unfortunately showed malignant cells, consistent with breast primary. ERand PR moderately positive, Her2 negative (IHC 0) -FDG PET 01/08/2024 was negative for hypermetabolic lesion or hypermetabolism around the pleura.  I recommend [name] scan for further evaluation. -she has started anastrozole  in 12/2023    Assessment & Plan Metastatic breast cancer Metastatic breast cancer, ER positive, slow-growing tumor.  Currently on anastrozole . Recent PET scan showed no activity in pleural effusion due to slow uptake of sugar by cancer cells. No other significant findings. Skin biopsy from scalp confirmed ER positive breast cancer. Cyriana PET scan is planned for more specific detection of ER positive breast cancer and to evaluate treatment response. 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 Symptoms include exertional dyspnea and persistent cough likely due to pleural involvement by cancer. Recent thoracentesis performed, and care transitioned to oncology. Monitoring symptoms will guide further intervention. - Monitor symptoms of dyspnea and cough - Repeat chest x-ray or ultrasound if symptoms worsen - Consider thoracentesis if breathing becomes difficult  Anemia Anemia with recent hemoglobin level of 11.1, indicating improvement post blood transfusion two weeks ago. Discontinued 81 mg aspirin  due to anemia and severe tricuspid regurgitation. - Monitor hemoglobin levels - Discontinue 81 mg aspirin   Severe tricuspid regurgitation Severe tricuspid regurgitation diagnosed via TEE. Cardiologist recommended discontinuation of 81 mg aspirin  due to anemia. - Continue monitoring cardiac status - Discontinue 81 mg aspirin   Plan -I personally reviewed her PET scan images with pt and her family -continue anastrozole   -I ordered [name] to be done in next 2-3 weeks -proceed B12 injection today and continue monthly -f/u in a month    SUMMARY OF ONCOLOGIC HISTORY: Oncology History Overview Note   Cancer Staging  Malignant neoplasm of overlapping sites of left breast in female, estrogen receptor positive (HCC) Staging form: Breast, AJCC 8th  Edition - Clinical stage from 08/11/2021: Stage IA (cT1c, cN0, cM0, G2,  ER+, PR+, HER2-) - Signed by Madison Lyncourt, MD on 08/18/2021     Malignant neoplasm of overlapping sites of left breast in female, estrogen receptor positive (HCC)  07/27/2021 Mammogram   EXAM: DIGITAL DIAGNOSTIC UNILATERAL LEFT MAMMOGRAM WITH TOMOSYNTHESIS AND CAD; ULTRASOUND LEFT BREAST LIMITED  IMPRESSION: 1. There is a suspicious mass in the left breast at 9 o'clock measuring 1.8 cm.   2. There is a 1.0 cm group of suspicious linear calcifications anterior to the suspicious mass in the left breast, as well as a faint 2 mm group which lie 1 cm lateral to the linear calcifications.   3.  No evidence of left axillary lymphadenopathy   08/11/2021 Cancer Staging   Staging form: Breast, AJCC 8th Edition - Clinical stage from 08/11/2021: Stage IA (cT1c, cN0, cM0, G2, ER+, PR+, HER2-) - Signed by Madison Corning, MD on 08/18/2021 Stage prefix: Initial diagnosis Histologic grading system: 3 grade system   08/11/2021 Initial Biopsy   Diagnosis 1. Breast, left, needle core biopsy, 9 o'clock, ribbon clip - INVASIVE MAMMARY CARCINOMA - SEE COMMENT 2. Breast, left, needle core biopsy, lower inner quadrant, x clip - INVASIVE MAMMARY CARCINOMA - MAMMARY CARCINOMA IN-SITU - CALCIFICATIONS - SEE COMMENT Microscopic Comment 1. The biopsy material shows an infiltrative proliferation of cells with arranged linearly and in small clusters. Based on the biopsy, the carcinoma appears Nottingham grade 2 of 3 and measures 1.2 cm in greatest linear extent. 2. Based on the biopsy, the carcinoma appears Nottingham grade 2 of 3 and measures 0.2 cm in greatest linear extent.  1. PROGNOSTIC INDICATORS Results: The tumor cells are NEGATIVE for Her2 (1+). Estrogen Receptor: 100%, POSITIVE, STRONG STAINING INTENSITY Progesterone Receptor: 60%, POSITIVE, STRONG STAINING INTENSITY Proliferation Marker Ki67: 10%  2. PROGNOSTIC  INDICATORS Results: The tumor cells are NEGATIVE for Her2 (1+). Estrogen Receptor: 100%, POSITIVE, STRONG STAINING INTENSITY Progesterone Receptor: 60%, POSITIVE, STRONG STAINING INTENSITY Proliferation Marker Ki67: 15%   08/13/2021 Mammogram   EXAM: DIGITAL DIAGNOSTIC UNILATERAL RIGHT MAMMOGRAM WITH TOMOSYNTHESIS AND CAD  IMPRESSION: No mammographic evidence for malignancy.   08/16/2021 Initial Diagnosis   Malignant neoplasm of overlapping sites of left breast in female, estrogen receptor positive (HCC)   08/25/2021 Imaging   EXAM: BILATERAL BREAST MRI WITH AND WITHOUT CONTRAST  IMPRESSION: 1. Biopsy-proven invasive ductal carcinoma measuring approximately 2.8 x 1.7 x 1.5 cm in the inner breast at middle depth, associated with the ribbon shaped tissue marking clip placed at the time of core needle biopsy. Enhancement extends approximately 1.4 cm posterior to the clip. 2. Approximate 1.8 cm post biopsy hematoma with associated rim enhancement at the site of the biopsy-proven invasive ductal carcinoma and DCIS in the LOWER INNER QUADRANT at anterior depth associated with the X shaped tissue marking clip. 3. In combination, the overall enhancement spans approximately 4 cm. 4. No MRI evidence of malignancy involving the RIGHT breast. 5. No pathologic lymphadenopathy.   08/27/2021 Genetic Testing   egative hereditary cancer genetic testing: no pathogenic variants detected in [facility] BRCAPlus Panel and CancerNext-Expanded +RNAinsight Panel.  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  The report dates are 08/27/2021 and 08/30/2021.   The BRCAplus panel offered by [employer] and includes sequencing and deletion/duplication analysis for the following 8 genes: ATM, BRCA1, BRCA2, CDH1, CHEK2, PALB2, PTEN, and TP53.  The CancerNext-Expanded gene panel offered by [facility] and includes sequencing, rearrangement, and RNA analysis for the following 77 genes:  AIP, ALK, APC, ATM, AXIN2, BAP1,  BARD1, BLM, BMPR1A, BRCA1, BRCA2, BRIP1, CDC73, CDH1, CDK4, CDKN1B, CDKN2A, CHEK2, CTNNA1, DICER1, FANCC, FH, FLCN, GALNT12, KIF1B, LZTR1, MAX, MEN1, MET, MLH1, MSH2, MSH3, MSH6, MUTYH, NBN, NF1, NF2, NTHL1, PALB2, PHOX2B, PMS2, POT1, PRKAR1A, PTCH1, PTEN, RAD51C, RAD51D, RB1, RECQL, RET, SDHA, SDHAF2, SDHB, SDHC, SDHD, SMAD4, SMARCA4, SMARCB1, SMARCE1, STK11, SUFU, TMEM127, TP53, TSC1, TSC2, VHL and XRCC2 (sequencing and deletion/duplication); EGFR, EGLN1, HOXB13, KIT, MITF, PDGFRA, POLD1, and POLE (sequencing only); EPCAM and GREM1 (deletion/duplication only).    09/09/2021 Definitive Surgery   FINAL MICROSCOPIC DIAGNOSIS:   A. BREAST, LEFT, LUMPECTOMY:  -  Invasive ductal carcinoma, Nottingham grade 2 of 3, 3.5 cm  -  Ductal carcinoma in-situ, intermediate grade  -  Margins uninvolved by carcinoma (<0.1 cm; anterior)  -  Previous biopsy site changes present    09/09/2021 Oncotype testing   Oncotype DX was obtained on the final surgical sample and the recurrence score of 21 predicts a risk of recurrence outside the breast over the next 9 years of 7%, if the patient's only systemic therapy is an antiestrogen for 5 years.  It also predicts no benefit from chemotherapy.   10/20/2021 - 11/16/2021 Radiation Therapy   Site Technique Total Dose (Gy) Dose per Fx (Gy) Completed Fx Beam Energies  Breast, Left: Breast_L_axilla 3D 40.05/40.05 2.67 15/15 10X  Breast, Left: Breast_L_Bst 3D 10/10 2 5/5 6X, 10X     12/06/2021 - 04/2022 Anti-estrogen oral therapy   Tamoxifen  x 5 years unable to tolerate, attempted anastrozole  and had similar side effects.  Opted to forego adjuvant antiestrogen therapy      Discussed the use of AI scribe software for clinical note transcription with the patient, who Clements verbal consent to proceed.  History of Present Illness Madison Clements "Madison Clements" is an 78 year old female with metastatic breast cancer who presents for follow-up on her  treatment and recent diagnostic studies. She was referred by her pulmonologist for management of her pleural effusion and metastatic breast cancer.  Madison Clements is currently on anastrozole  for metastatic breast cancer, which she has been taking for approximately two weeks. She initially experienced sleep disturbances but is now adjusting well to the medication. She reports no pain or discomfort.  She experiences shortness of breath primarily with exertion, which has improved since a recent procedure to remove pleural effusion. A recent PET scan showed a moderate to large pleural effusion on the left side and a small amount on the right side. Her persistent cough is slightly improved.  A previous thoracentesis and ultrasound showed no effusion on the right side, but a recent chest x-ray in March indicated a new right-sided effusion. Her history includes a skin biopsy revealing ER-positive breast cancer cells on her scalp. She also has anemia, with a current hemoglobin level of 11.1, and received a blood transfusion two weeks ago.     All other systems were reviewed with the patient and are negative.  MEDICAL HISTORY:  Past Medical History:  Diagnosis Date   Allergy 1990   Morphine following surgery   Arthritis    generalized   Atypical mole 02/24/2015   RGHT LATERAL THIGH MODERATE   Atypical mole 01/20/2022   Left Breast (mild)   Atypical mole 01/20/2022   Mid Back (mild)   Atypical nevi 02/24/2015   RIGHT NECK MILD   Atypical nevi 08/25/2015   LEFT UPPER ARM MILD   Atypical nevi 08/25/2015   LEFT LATERAL FOREARM MILD/FREE   Atypical nevi 02/23/2016   LEFT THIGH MODERATE/FREE  Atypical nevi 02/23/2016   LEFT MEDIAL LEG MODERATE/FREE   Atypical nevi 02/23/2016   LEFT UPPER BACK MILD /FREE   Atypical nevi 08/23/2016   RIGHT ANT PROX THIGH MILD /FREE   Atypical nevi 07/25/2018   MID LOWER BACK MODERATE   Atypical nevi 01/22/2019   LEFT MID BACK MILD /FREE   Atypical nevi 01/22/2019    LEFT NECK MILD   Atypical nevi 08/06/2019   LEFT INNER BREAST MODERATE W/S   Atypical nevi 08/06/2019   LEFT OUTER SIDE MILD/FREE   Blood transfusion without reported diagnosis    hx of   Breast cancer (HCC) 2022   CAD (coronary artery disease), native coronary artery 02/07/2019   Minimal CAD at the time of cath in 2016 in [place]     Family history of breast cancer 08/19/2021   Family history of pancreatic cancer 08/19/2021   Fatty liver 05/21/2020   Gastric polyps 10/02/2018   EGD 03/2015; benign   GERD (gastroesophageal reflux disease) 10/02/2018   on meds   History of melanoma 10/02/2018   Abdomen 2012; 2013; s/p local excisions.    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 DISTAL LOWER LEG TREATED BY DR. Duaine German   Sleep apnea    Sleep apnea 05/26/2020   uses CPAP   Tricuspid regurgitation    moderate by echo 04/2023    SURGICAL HISTORY: Past Surgical History:  Procedure Laterality Date   ABDOMINAL HYSTERECTOMY  1990   APPENDECTOMY  1962   BLADDER SUSPENSION  1990   BREAST LUMPECTOMY WITH RADIOACTIVE SEED LOCALIZATION Left 09/09/2021   Procedure: LEFT BREAST LUMPECTOMY WITH RADIOACTIVE SEED LOCALIZATION X2;  Surgeon: Madison Ebbing, MD;  Location: [facility];  Service: General;  Laterality: Left;   COLONOSCOPY  2016   in [place]-hx of polyps   EYE SURGERY  2013 torn retina laser   MENISCUS REPAIR Right 2001   right knee   RIGHT HEART CATH AND CORONARY ANGIOGRAPHY N/A 06/20/2023   Procedure: RIGHT HEART CATH AND CORONARY ANGIOGRAPHY;  Surgeon: Darlis Eisenmenger, MD;  Location: [facility] INVASIVE CV LAB;  Service: Cardiovascular;  Laterality: N/A;   thumb surgery Left    TONSILLECTOMY AND ADENOIDECTOMY     TRANSESOPHAGEAL ECHOCARDIOGRAM (CATH LAB) N/A 01/04/2024   Procedure: TRANSESOPHAGEAL ECHOCARDIOGRAM;  Surgeon: Jann Melody, MD;  Location: [facility] INVASIVE CV LAB;  Service: Cardiovascular;  Laterality: N/A;   tuabl ligation     TUBAL LIGATION  1975   WISDOM TOOTH EXTRACTION      I have reviewed the social history and family history with the patient and they are unchanged from previous note.  ALLERGIES:  is allergic to morphine, metoprolol , nebivolol , and morphine and codeine.  MEDICATIONS:  Current Outpatient Medications  Medication Sig Dispense Refill   anastrozole  (ARIMIDEX ) 1 MG tablet Take 1 tablet (1 mg total) by mouth daily. 90 tablet 1   diltiazem  (CARDIZEM  CD) 300 MG 24 hr capsule Take 1 capsule (300 mg total) by mouth daily. 90 capsule 3   ezetimibe  (ZETIA ) 10 MG tablet  Take 1 tablet (10 mg total) by mouth daily. 90 tablet 3   omeprazole  (PRILOSEC) 20 MG capsule Take 20 mg by mouth daily.     Prenatal Vit-Fe Fumarate-FA (PRENATAL VITAMIN PO) Take by mouth daily. Prenatal w/folic acid po daily     Probiotic Product (ADVANCED PROBIOTIC PO) Take 1 capsule by mouth daily.     rosuvastatin  (CRESTOR ) 20 MG tablet Take 1 tablet (20 mg total) by mouth daily. 90 tablet 3   No current facility-administered medications for this visit.    PHYSICAL EXAMINATION: ECOG PERFORMANCE STATUS: 2 - Symptomatic, <50% confined to bed  Vitals:   01/15/24 1449  BP: (!) 122/52  Pulse: 91  Resp: 19  Temp: 98.4 F (36.9 C)  SpO2: 94%   Wt Readings from Last 3 Encounters:  01/15/24 139 lb 9.6 oz (63.3 kg)  01/02/24 142 lb 12.8 oz (64.8 kg)  12/22/23 141 lb 3.2 oz (64 kg)     GENERAL:alert, no distress and comfortable SKIN: skin color, texture, turgor are normal, no rashes or significant lesions EYES: normal, Conjunctiva are pink and  non-injected, sclera clear Musculoskeletal:no cyanosis of digits and no clubbing  NEURO: alert & oriented x 3 with fluent speech, no focal motor/sensory deficits  Physical Exam    LABORATORY DATA:  I have reviewed the data as listed    Latest Ref Rng & Units 01/15/2024    2:19 PM 01/04/2024    4:14 PM 01/04/2024    7:13 AM  CBC  WBC 4.0 - 10.5 K/uL 4.0  4.4    Hemoglobin 12.0 - 15.0 g/dL 74.2  7.9  7.5   Hematocrit 36.0 - 46.0 % 32.1  24.2  22.0   Platelets 150 - 400 K/uL 140  150          Latest Ref Rng & Units 01/15/2024    2:19 PM 01/04/2024    4:14 PM 01/04/2024    7:13 AM  CMP  Glucose 70 - 99 mg/dL 595  638  756   BUN 8 - 23 mg/dL 17  13  10    Creatinine 0.44 - 1.00 mg/dL 4.33  2.95  1.88   Sodium 135 - 145 mmol/L 140  139  140   Potassium 3.5 - 5.1 mmol/L 3.2  3.2  3.3   Chloride 98 - 111 mmol/L 105  107  106   CO2 22 - 32 mmol/L 26  23    Calcium  8.9 - 10.3 mg/dL 9.1  9.3    Total Protein 6.5 - 8.1 g/dL 6.8  6.4    Total Bilirubin 0.0 - 1.2 mg/dL 1.0  0.9    Alkaline Phos 38 - 126 U/L 94  95    AST 15 - 41 U/L 27  37    ALT 0 - 44 U/L 16  18        RADIOGRAPHIC STUDIES: I have personally reviewed the radiological images as listed and agreed with the findings in the report. No results found.    Orders Placed This Encounter  Procedures   NM PET (CERIANNA) WHOLE BODY    Standing Status:   Future    Expected Date:   01/29/2024    Expiration Date:   01/14/2025    If indicated for the ordered procedure, I authorize the administration of a radiopharmaceutical per Radiology protocol:   Yes    Preferred imaging location?:   [facility]   All questions were answered. The patient knows to call the clinic with  any problems, questions or concerns. No barriers to learning was detected. The total time spent in the appointment was 30 minutes.     Madison [place] Komelik, MD 01/15/2024

## 2024-01-16 NOTE — Progress Notes (Signed)
 Spoke with Crystal with [facility] [phone] (Dr. Theron Flavin) on the status of the Breast Prognostic Profile for [facility] [phone].  Crystal contacted [employer] but had to leave a voicemail with them requesting a call back.  Crystal stated she would contact Dr. Candise Chambers office once she hears back from [employer].

## 2024-01-17 ENCOUNTER — Other Ambulatory Visit: Payer: Self-pay

## 2024-01-19 DIAGNOSIS — Z79899 Other long term (current) drug therapy: Secondary | ICD-10-CM | POA: Diagnosis not present

## 2024-01-19 LAB — BASIC METABOLIC PANEL WITH GFR
BUN/Creatinine Ratio: 13 (ref 12–28)
BUN: 12 mg/dL (ref 8–27)
CO2: 21 mmol/L (ref 20–29)
Calcium: 10.1 mg/dL (ref 8.7–10.3)
Chloride: 103 mmol/L (ref 96–106)
Creatinine, Ser: 0.96 mg/dL (ref 0.57–1.00)
Glucose: 106 mg/dL — ABNORMAL HIGH (ref 70–99)
Potassium: 4 mmol/L (ref 3.5–5.2)
Sodium: 141 mmol/L (ref 134–144)
eGFR: 60 mL/min/{1.73_m2} (ref >59)

## 2024-01-26 ENCOUNTER — Encounter (HOSPITAL_COMMUNITY)
Admission: RE | Admit: 2024-01-26 | Discharge: 2024-01-26 | Disposition: A | Discharge status: Home / Self Care | Source: Ambulatory Visit | Attending: Hematology | Admitting: Hematology

## 2024-01-26 DIAGNOSIS — C50812 Malignant neoplasm of overlapping sites of left female breast: Secondary | ICD-10-CM | POA: Insufficient documentation

## 2024-01-26 DIAGNOSIS — C50919 Malignant neoplasm of unspecified site of unspecified female breast: Secondary | ICD-10-CM | POA: Diagnosis not present

## 2024-01-26 DIAGNOSIS — Z170 Estrogen receptor positive status [ER+]: Secondary | ICD-10-CM | POA: Insufficient documentation

## 2024-01-26 DIAGNOSIS — C773 Secondary and unspecified malignant neoplasm of axilla and upper limb lymph nodes: Secondary | ICD-10-CM | POA: Diagnosis not present

## 2024-01-26 MED ORDER — FLUOROESTRADIOL F 18 4-100 MCI/ML IV SOLN: 5.6690 | Freq: Once | INTRAVENOUS | Status: DC

## 2024-01-30 ENCOUNTER — Encounter: Payer: Self-pay | Admitting: Hematology

## 2024-01-31 NOTE — Progress Notes (Signed)
 Spoke with Madison Clements with [facility] [phone] (Dr. Theron Flavin) on Breast Prognostic Profile for [facility] [phone].  Madison Clements stated she will fax the pathology report to Dr. Candise Chambers office.  Awaiting fax.

## 2024-02-01 ENCOUNTER — Ambulatory Visit: Payer: Medicare Other | Admitting: Internal Medicine

## 2024-02-06 ENCOUNTER — Other Ambulatory Visit: Payer: Self-pay

## 2024-02-06 ENCOUNTER — Telehealth: Payer: Self-pay

## 2024-02-06 ENCOUNTER — Ambulatory Visit
Admission: RE | Admit: 2024-02-06 | Discharge: 2024-02-06 | Disposition: A | Discharge status: Home / Self Care | Source: Ambulatory Visit | Attending: Hematology

## 2024-02-06 ENCOUNTER — Encounter: Payer: Self-pay | Admitting: Dermatology

## 2024-02-06 DIAGNOSIS — Z853 Personal history of malignant neoplasm of breast: Secondary | ICD-10-CM | POA: Diagnosis not present

## 2024-02-06 DIAGNOSIS — Z9889 Other specified postprocedural states: Secondary | ICD-10-CM

## 2024-02-06 DIAGNOSIS — J90 Pleural effusion, not elsewhere classified: Secondary | ICD-10-CM

## 2024-02-06 DIAGNOSIS — C50812 Malignant neoplasm of overlapping sites of left female breast: Secondary | ICD-10-CM

## 2024-02-06 NOTE — Telephone Encounter (Signed)
 Pt called extremely SOB and coughing.  Pt stated she's in need for another thoracentesis and is requesting if Dr. Maryalice Smaller would place order for another thoracentesis.  Consulted with Dr. Candise Chambers APPs regarding order for thoracentesis.  Verbal order given for thoracentesis.  Provided pt with telephone number to Central Scheduling to call to schedule thoracentesis.

## 2024-02-07 ENCOUNTER — Ambulatory Visit (HOSPITAL_COMMUNITY)
Admission: RE | Admit: 2024-02-07 | Discharge: 2024-02-07 | Disposition: A | Discharge status: Home / Self Care | Source: Ambulatory Visit | Attending: Radiology

## 2024-02-07 ENCOUNTER — Other Ambulatory Visit (HOSPITAL_COMMUNITY): Payer: Self-pay | Admitting: Radiology

## 2024-02-07 ENCOUNTER — Ambulatory Visit (HOSPITAL_COMMUNITY)
Admission: RE | Admit: 2024-02-07 | Discharge: 2024-02-07 | Disposition: A | Discharge status: Home / Self Care | Source: Ambulatory Visit | Attending: Hematology | Admitting: Hematology

## 2024-02-07 DIAGNOSIS — C50812 Malignant neoplasm of overlapping sites of left female breast: Secondary | ICD-10-CM | POA: Insufficient documentation

## 2024-02-07 DIAGNOSIS — Z170 Estrogen receptor positive status [ER+]: Secondary | ICD-10-CM | POA: Diagnosis not present

## 2024-02-07 DIAGNOSIS — Z9889 Other specified postprocedural states: Secondary | ICD-10-CM

## 2024-02-07 DIAGNOSIS — J90 Pleural effusion, not elsewhere classified: Secondary | ICD-10-CM | POA: Diagnosis not present

## 2024-02-07 DIAGNOSIS — Z48813 Encounter for surgical aftercare following surgery on the respiratory system: Secondary | ICD-10-CM | POA: Diagnosis not present

## 2024-02-07 DIAGNOSIS — J9811 Atelectasis: Secondary | ICD-10-CM | POA: Diagnosis not present

## 2024-02-07 HISTORY — PX: IR THORACENTESIS ASP PLEURAL SPACE W/IMG GUIDE: IMG5380

## 2024-02-07 MED ORDER — LIDOCAINE HCL 1 % IJ SOLN: 20.0000 mL | Freq: Once | INTRAMUSCULAR | Status: DC

## 2024-02-07 MED ORDER — LIDOCAINE HCL 1 % IJ SOLN
INTRAMUSCULAR | Status: AC
  Filled 2024-02-07: qty 20

## 2024-02-07 NOTE — Procedures (Signed)
 PROCEDURE SUMMARY:  Successful image-guided therapeutic left thoracentesis. Yielded 1.2 liters of straw colored fluid. Patient tolerated procedure well. EBL: trace No immediate complications.  Specimen not sent for labs. Post procedure CXR ordered.  Please see imaging section of Epic for full dictation.  Damian Duke Deanette Tullius PA-C 02/07/2024 1:21 PM

## 2024-02-08 LAB — ACID FAST CULTURE WITH REFLEXED SENSITIVITIES (MYCOBACTERIA): Acid Fast Culture: NEGATIVE

## 2024-02-08 NOTE — Progress Notes (Signed)
 Spoke with [facility] Doylene Genet Reading Room to expedite the read on pt's Cerianna  PET done on 01/25/2024.  Doylene Genet stated she will mark the scan for STAT read.

## 2024-02-12 ENCOUNTER — Inpatient Hospital Stay

## 2024-02-12 ENCOUNTER — Ambulatory Visit

## 2024-02-12 ENCOUNTER — Other Ambulatory Visit: Payer: Self-pay

## 2024-02-12 ENCOUNTER — Telehealth: Payer: Self-pay | Admitting: Pharmacist

## 2024-02-12 ENCOUNTER — Inpatient Hospital Stay: Attending: Hematology

## 2024-02-12 ENCOUNTER — Other Ambulatory Visit (HOSPITAL_COMMUNITY): Payer: Self-pay

## 2024-02-12 ENCOUNTER — Inpatient Hospital Stay (HOSPITAL_BASED_OUTPATIENT_CLINIC_OR_DEPARTMENT_OTHER): Admitting: Hematology

## 2024-02-12 ENCOUNTER — Other Ambulatory Visit (HOSPITAL_COMMUNITY): Payer: Self-pay | Admitting: Hematology

## 2024-02-12 ENCOUNTER — Encounter: Payer: Self-pay | Admitting: Hematology

## 2024-02-12 ENCOUNTER — Other Ambulatory Visit: Payer: Self-pay | Admitting: Pharmacy Technician

## 2024-02-12 VITALS — BP 130/60 | HR 94 | Temp 98.3°F | Resp 15 | Ht 62.0 in | Wt 139.7 lb

## 2024-02-12 DIAGNOSIS — Z170 Estrogen receptor positive status [ER+]: Secondary | ICD-10-CM | POA: Insufficient documentation

## 2024-02-12 DIAGNOSIS — M810 Age-related osteoporosis without current pathological fracture: Secondary | ICD-10-CM | POA: Insufficient documentation

## 2024-02-12 DIAGNOSIS — Z7983 Long term (current) use of bisphosphonates: Secondary | ICD-10-CM | POA: Insufficient documentation

## 2024-02-12 DIAGNOSIS — J90 Pleural effusion, not elsewhere classified: Secondary | ICD-10-CM

## 2024-02-12 DIAGNOSIS — Z79811 Long term (current) use of aromatase inhibitors: Secondary | ICD-10-CM | POA: Insufficient documentation

## 2024-02-12 DIAGNOSIS — R634 Abnormal weight loss: Secondary | ICD-10-CM | POA: Insufficient documentation

## 2024-02-12 DIAGNOSIS — I071 Rheumatic tricuspid insufficiency: Secondary | ICD-10-CM | POA: Insufficient documentation

## 2024-02-12 DIAGNOSIS — C7951 Secondary malignant neoplasm of bone: Secondary | ICD-10-CM | POA: Insufficient documentation

## 2024-02-12 DIAGNOSIS — Z1721 Progesterone receptor positive status: Secondary | ICD-10-CM | POA: Insufficient documentation

## 2024-02-12 DIAGNOSIS — M8580 Other specified disorders of bone density and structure, unspecified site: Secondary | ICD-10-CM

## 2024-02-12 DIAGNOSIS — Z1732 Human epidermal growth factor receptor 2 negative status: Secondary | ICD-10-CM | POA: Insufficient documentation

## 2024-02-12 DIAGNOSIS — J910 Malignant pleural effusion: Secondary | ICD-10-CM | POA: Insufficient documentation

## 2024-02-12 DIAGNOSIS — Z923 Personal history of irradiation: Secondary | ICD-10-CM | POA: Insufficient documentation

## 2024-02-12 DIAGNOSIS — D519 Vitamin B12 deficiency anemia, unspecified: Secondary | ICD-10-CM | POA: Diagnosis not present

## 2024-02-12 DIAGNOSIS — C50812 Malignant neoplasm of overlapping sites of left female breast: Secondary | ICD-10-CM

## 2024-02-12 LAB — CBC WITH DIFFERENTIAL/PLATELET
Abs Immature Granulocytes: 0.04 10*3/uL (ref 0.00–0.07)
Basophils Absolute: 0 10*3/uL (ref 0.0–0.1)
Basophils Relative: 1 %
Eosinophils Absolute: 0.1 10*3/uL (ref 0.0–0.5)
Eosinophils Relative: 1 %
HCT: 29.8 % — ABNORMAL LOW (ref 36.0–46.0)
Hemoglobin: 10.1 g/dL — ABNORMAL LOW (ref 12.0–15.0)
Immature Granulocytes: 1 %
Lymphocytes Relative: 21 %
Lymphs Abs: 1 10*3/uL (ref 0.7–4.0)
MCH: 31.8 pg (ref 26.0–34.0)
MCHC: 33.9 g/dL (ref 30.0–36.0)
MCV: 93.7 fL (ref 80.0–100.0)
Monocytes Absolute: 0.5 10*3/uL (ref 0.1–1.0)
Monocytes Relative: 10 %
Neutro Abs: 3.1 10*3/uL (ref 1.7–7.7)
Neutrophils Relative %: 66 %
Platelets: 217 10*3/uL (ref 150–400)
RBC: 3.18 MIL/uL — ABNORMAL LOW (ref 3.87–5.11)
RDW: 16.8 % — ABNORMAL HIGH (ref 11.5–15.5)
WBC: 4.7 10*3/uL (ref 4.0–10.5)
nRBC: 1.9 % — ABNORMAL HIGH (ref 0.0–0.2)

## 2024-02-12 LAB — COMPREHENSIVE METABOLIC PANEL WITH GFR
ALT: 18 U/L (ref 0–44)
AST: 25 U/L (ref 15–41)
Albumin: 4 g/dL (ref 3.5–5.0)
Alkaline Phosphatase: 101 U/L (ref 38–126)
Anion gap: 9 (ref 5–15)
BUN: 13 mg/dL (ref 8–23)
CO2: 27 mmol/L (ref 22–32)
Calcium: 9.3 mg/dL (ref 8.9–10.3)
Chloride: 103 mmol/L (ref 98–111)
Creatinine, Ser: 1 mg/dL (ref 0.44–1.00)
GFR, Estimated: 57 mL/min — ABNORMAL LOW (ref >60)
Glucose, Bld: 110 mg/dL — ABNORMAL HIGH (ref 70–99)
Potassium: 3.4 mmol/L — ABNORMAL LOW (ref 3.5–5.1)
Sodium: 139 mmol/L (ref 135–145)
Total Bilirubin: 1 mg/dL (ref 0.0–1.2)
Total Protein: 6.6 g/dL (ref 6.5–8.1)

## 2024-02-12 LAB — VITAMIN B12: Vitamin B-12: 337 pg/mL (ref 180–914)

## 2024-02-12 MED ORDER — ONDANSETRON HCL 8 MG PO TABS: 8.0000 mg | ORAL_TABLET | Freq: Three times a day (TID) | ORAL | 2 refills | Status: AC | PRN

## 2024-02-12 MED ORDER — CYANOCOBALAMIN 1000 MCG/ML IJ SOLN
1000.0000 ug | Freq: Once | INTRAMUSCULAR | Status: AC
  Administered 2024-02-12: 1000 ug via INTRAMUSCULAR
  Filled 2024-02-12: qty 1

## 2024-02-12 MED ORDER — PALBOCICLIB 75 MG PO TABS
75.0000 mg | ORAL_TABLET | Freq: Every day | ORAL | 0 refills | Status: DC
  Filled 2024-02-12 – 2024-02-14 (×2): qty 21, 28d supply, fill #0

## 2024-02-12 NOTE — Telephone Encounter (Signed)
 Oral Oncology Pharmacist Encounter  Prior Authorization for Anibal Kent has been approved.    PA# [id number] Effective dates: 02/12/24 through 08/10/24  Patients co-pay is $4098.11  Oral Oncology Clinic will continue to follow.   Jude Norton, PharmD, BCPS, BCOP Hematology/Oncology Clinical Pharmacist [facility] Oral Chemotherapy Navigation Clinics [phone] 02/12/2024 11:16 AM

## 2024-02-12 NOTE — Progress Notes (Signed)
 [facility]   Telephone:([phone] Fax:([phone]   Clinic Follow up Note   Patient Care Team: Luevenia Saha, MD as PCP - General (Family Medicine) Jacqueline Matsu, MD as PCP - Cardiology (Cardiology) Jacqueline Matsu, MD as Consulting Physician (Cardiology) Rexene Catching, MD as Consulting Physician (Ophthalmology) Dorthey Gave, PA-C as Physician Assistant (Dermatology) Dareen Ebbing, MD as Consulting Physician (General Surgery) Sonja Challis, MD as Consulting Physician (Hematology) Colie Dawes, MD as Attending Physician (Radiation Oncology) Myrle Aspen, [facility] (Inactive) (Pharmacist)  Date of Service:  02/12/2024  CHIEF COMPLAINT: f/u of metastatic breast cancer  CURRENT THERAPY:  Anastrozole  1 mg daily, started in April 2025 Pending Ibrance  Oncology History   Malignant neoplasm of overlapping sites of left breast in female, estrogen receptor positive (HCC) Stage IA, pT2, cN0, ER+/PR+/HER2-, Grade 2, RS 21, malignant pleural effusion in 12/2023 -she was under short-term f/u for left breast asymmetry. Biopsy 08/11/21 confirmed invasive mammary carcinoma. -left lumpectomy on 09/09/21 by Dr. 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 No nodes were removed due to her age. -she received adjuvant RT, completed in 11/2021 -she started adjuvant tamoxifen  in 12/2021, but could not tolerate. She subsequently tried anastrozole  and still can not tolerate. She stopped in 04/2022 -Screening mammogram in May 2024 were negative. - She developed pleural effusion in April 2025, cytology unfortunately showed malignant cells, consistent with breast primary. ERand PR moderately positive, Her2 negative (IHC 0) -FDG PET 01/08/2024 was negative for hypermetabolic lesion or hypermetabolism around the pleura.  Cer[place]na  PET scan 01/26/2024 showed diffuse bone mets and mediastinal adenopathy. Pleura was negative on PET -her skin (scalp) biopsy also showed metastatic  breast cancer, ER and PR positive, HER2 negative.   -She has started anastrozole  in 12/2023  - Due to her recurrent pleural effusion, and high disease burden, I will add a low-dose Ibrance for better disease control - Continue Zometa , will change to every 3 months due to her diffuse bone metastasis.   Assessment & Plan Metastatic breast cancer Metastatic breast cancer with pleural effusion, bone metastasis, and scalp involvement. Pleural effusion drained twice, with 1200 cc removed last time. PET scan shows additional bone metastasis in pelvic bone, spine, and sternum. Disease appears more extensive than initially thought. Anastrozole  started on January 03, 2024, but disease progression suggests need for additional treatment. Discussed adding Ibrance (palbociclib), an oral medication, to improve disease control. Common side effects include fatigue, diarrhea, and low blood counts. Discussed risks and benefits, including better disease control and potential side effects. Shared decision to start Ibrance at 75 mg, with dose titration based on tolerance. Median survival for metastatic breast cancer is 3-4 years, with some living over 10 years. Disease appears aggressive, warranting additional treatment. - Start Ibrance (palbociclib) 75 mg, three weeks on, one week off. - Schedule follow-up in two weeks to monitor blood counts. - Order Zometa  infusion at next visit if kidney function is stable. - Schedule thoracentesis in four weeks from last procedure. - Order thyroid  ultrasound to evaluate indeterminate finding on PET scan. - Refer to dietitian for nutritional support. - Monitor for severe diarrhea or dehydration and advise to call if symptoms occur. - Repeat PET scan in three months to assess treatment efficacy.  Osteoporosis and bone metastasis  Osteoporosis with increased risk of fracture due to bone metastasis. Currently receiving Zometa  infusions to strengthen bones and reduce fracture risk. Last  infusion in March 2025, next scheduled for February 20, 2024.  Risk of jaw necrosis discussed, necessitating dental evaluation. - Continue Zometa  infusions every three months. - Ensure dental evaluation to monitor for jaw necrosis risk.  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 Current focus is on managing metastatic breast cancer. Surgery not recommended at this time. Consideration for future intervention if cancer is well controlled and symptoms persist. - Manage symptoms with medication. - Re-evaluate for potential surgical intervention if cancer is well controlled in the future.  Weight loss Weight loss of 15 pounds with decreased appetite. Discussed nutritional supplementation and referral to dietitian for further evaluation and support. - Refer to dietitian for nutritional support. - Recommend nutritional supplements like Ensure or Boost.  Recurrent bilateral pleural effusion - Status post left thoracentesis twice, cytology positive for malignant cells. - Will schedule next thoracentesis 4 weeks from last one   Plan - I personally reviewed her Cerianna  PET scan images with patient and her daughter - Continue anastrozole , I recommend adding Ibrance 75 mg daily 3 weeks on and 1 week of, starting next Monday 6/9, for better disease control. - Changed Zometa  from every 6 months to every 3 months, next due in later June - Lab, follow-up, and Zometa  in 3 weeks - Dietitian referral for weight loss - Ultrasound of thyroid  in next 2 to 4 weeks   SUMMARY OF ONCOLOGIC HISTORY: Oncology History Overview Note   Cancer Staging  Malignant neoplasm of overlapping sites of left breast in female, estrogen receptor positive (HCC) Staging form: Breast, AJCC 8th Edition - Clinical stage from 08/11/2021: Stage IA (cT1c, cN0, cM0, G2, ER+, PR+, HER2-) - Signed by Sonja St. Jacob, MD on 08/18/2021     Malignant neoplasm of overlapping sites  of left breast in female, estrogen receptor positive (HCC)  07/27/2021 Mammogram   EXAM: DIGITAL DIAGNOSTIC UNILATERAL LEFT MAMMOGRAM WITH TOMOSYNTHESIS AND CAD; ULTRASOUND LEFT BREAST LIMITED  IMPRESSION: 1. There is a suspicious mass in the left breast at 9 o'clock measuring 1.8 cm.   2. There is a 1.0 cm group of suspicious linear calcifications anterior to the suspicious mass in the left breast, as well as a faint 2 mm group which lie 1 cm lateral to the linear calcifications.   3.  No evidence of left axillary lymphadenopathy   08/11/2021 Cancer Staging   Staging form: Breast, AJCC 8th Edition - Clinical stage from 08/11/2021: Stage IA (cT1c, cN0, cM0, G2, ER+, PR+, HER2-) - Signed by Sonja [employer], MD on 08/18/2021 Stage prefix: Initial diagnosis Histologic grading system: 3 grade system   08/11/2021 Initial Biopsy   Diagnosis 1. Breast, left, needle core biopsy, 9 o'clock, ribbon clip - INVASIVE MAMMARY CARCINOMA - SEE COMMENT 2. Breast, left, needle core biopsy, lower inner quadrant, x clip - INVASIVE MAMMARY CARCINOMA - MAMMARY CARCINOMA IN-SITU - CALCIFICATIONS - SEE COMMENT Microscopic Comment 1. The biopsy material shows an infiltrative proliferation of cells with arranged linearly and in small clusters. Based on the biopsy, the carcinoma appears Nottingham grade 2 of 3 and measures 1.2 cm in greatest linear extent. 2. Based on the biopsy, the carcinoma appears Nottingham grade 2 of 3 and measures 0.2 cm in greatest linear extent.  1. PROGNOSTIC INDICATORS Results: The tumor cells are NEGATIVE for Her2 (1+). Estrogen Receptor: 100%, POSITIVE, STRONG STAINING INTENSITY Progesterone Receptor: 60%, POSITIVE, STRONG STAINING INTENSITY Proliferation Marker Ki67: 10%  2. PROGNOSTIC INDICATORS Results: The tumor cells are NEGATIVE for Her2 (1+). Estrogen Receptor: 100%, POSITIVE, STRONG STAINING INTENSITY Progesterone Receptor:  60%, POSITIVE, STRONG STAINING  INTENSITY Proliferation Marker Ki67: 15%   08/13/2021 Mammogram   EXAM: DIGITAL DIAGNOSTIC UNILATERAL RIGHT MAMMOGRAM WITH TOMOSYNTHESIS AND CAD  IMPRESSION: No mammographic evidence for malignancy.   08/16/2021 Initial Diagnosis   Malignant neoplasm of overlapping sites of left breast in female, estrogen receptor positive (HCC)   08/25/2021 Imaging   EXAM: BILATERAL BREAST MRI WITH AND WITHOUT CONTRAST  IMPRESSION: 1. Biopsy-proven invasive ductal carcinoma measuring approximately 2.8 x 1.7 x 1.5 cm in the inner breast at middle depth, associated with the ribbon shaped tissue marking clip placed at the time of core needle biopsy. Enhancement extends approximately 1.4 cm posterior to the clip. 2. Approximate 1.8 cm post biopsy hematoma with associated rim enhancement at the site of the biopsy-proven invasive ductal carcinoma and DCIS in the LOWER INNER QUADRANT at anterior depth associated with the X shaped tissue marking clip. 3. In combination, the overall enhancement spans approximately 4 cm. 4. No MRI evidence of malignancy involving the RIGHT breast. 5. No pathologic lymphadenopathy.   08/27/2021 Genetic Testing   egative hereditary cancer genetic testing: no pathogenic variants detected in [facility] BRCAPlus Panel and CancerNext-Expanded +RNAinsight Panel.  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  The report dates are 08/27/2021 and 08/30/2021.   The BRCAplus panel offered by [employer] and includes sequencing and deletion/duplication analysis for the following 8 genes: ATM, BRCA1, BRCA2, CDH1, CHEK2, PALB2, PTEN, and TP53.  The CancerNext-Expanded gene panel offered by [facility] [facility] and includes sequencing, rearrangement, and RNA analysis for the following 77 genes: AIP, ALK, APC, ATM, AXIN2, BAP1, BARD1, BLM, BMPR1A, BRCA1, BRCA2, BRIP1, CDC73, CDH1, CDK4, CDKN1B, CDKN2A, CHEK2, CTNNA1, DICER1, FANCC, FH, FLCN, GALNT12, KIF1B, LZTR1, MAX, MEN1,  MET, MLH1, MSH2, MSH3, MSH6, MUTYH, NBN, NF1, NF2, NTHL1, PALB2, PHOX2B, PMS2, POT1, PRKAR1A, PTCH1, PTEN, RAD51C, RAD51D, RB1, RECQL, RET, SDHA, SDHAF2, SDHB, SDHC, SDHD, SMAD4, SMARCA4, SMARCB1, SMARCE1, STK11, SUFU, TMEM127, TP53, TSC1, TSC2, VHL and XRCC2 (sequencing and deletion/duplication); EGFR, EGLN1, HOXB13, KIT, MITF, PDGFRA, POLD1, and POLE (sequencing only); EPCAM and GREM1 (deletion/duplication only).    09/09/2021 Definitive Surgery   FINAL MICROSCOPIC DIAGNOSIS:   A. BREAST, LEFT, LUMPECTOMY:  -  Invasive ductal carcinoma, Nottingham grade 2 of 3, 3.5 cm  -  Ductal carcinoma in-situ, intermediate grade  -  Margins uninvolved by carcinoma (<0.1 cm; anterior)  -  Previous biopsy site changes present    09/09/2021 Oncotype testing   Oncotype DX was obtained on the final surgical sample and the recurrence score of 21 predicts a risk of recurrence outside the breast over the next 9 years of 7%, if the patient's only systemic therapy is an antiestrogen for 5 years.  It also predicts no benefit from chemotherapy.   10/20/2021 - 11/16/2021 Radiation Therapy   Site Technique Total Dose (Gy) Dose per Fx (Gy) Completed Fx Beam Energies  Breast, Left: Breast_L_axilla 3D 40.05/40.05 2.67 15/15 10X  Breast, Left: Breast_L_Bst 3D 10/10 2 5/5 6X, 10X     12/06/2021 - 04/2022 Anti-estrogen oral therapy   Tamoxifen  x 5 years unable to tolerate, attempted anastrozole  and had similar side effects.  Opted to forego adjuvant antiestrogen therapy      Discussed the use of AI scribe software for clinical note transcription with the patient, who gave verbal consent to proceed.  History of Present Illness Madison Clements "Madison Clements" is an 78 year old female with metastatic breast cancer who presents for follow-up.  She recently underwent two  procedures to drain pleural effusion, with the most recent one last week, which alleviated significant shortness of breath, fatigue, and tachycardia with  minimal exertion. The first procedure was on April 14, and a six-week wait for the second led to symptom exacerbation.  A recent PET scan showed pleural effusion and bone involvement in the pelvic area and spine. She experiences pelvic pain, especially when moving in bed, described as pressure in the buttocks and sternum. Anastrozole  treatment began on April 23, but its effect on symptoms is unclear. Cancerous bumps on her head were confirmed by biopsy but not visible on the PET scan.  She has severe tricuspid regurgitation and was taken off Lasix  due to kidney function concerns. She experiences tachycardia with minimal exertion, leading to exhaustion. Previous x-rays noted thickening in the lower left lung.  Current medications include anastrozole . She received a blood transfusion in April, which significantly improved her symptoms. She has lost about 15 pounds and has a very low appetite, with few foods appealing to her.     All other systems were reviewed with the patient and are negative.  MEDICAL HISTORY:  Past Medical History:  Diagnosis Date   Allergy 1990   Morphine following surgery   Arthritis    generalized   Atypical mole 02/24/2015   RGHT LATERAL THIGH MODERATE   Atypical mole 01/20/2022   Left Breast (mild)   Atypical mole 01/20/2022   Mid Back (mild)   Atypical nevi 02/24/2015   RIGHT NECK MILD   Atypical nevi 08/25/2015   LEFT UPPER ARM MILD   Atypical nevi 08/25/2015   LEFT LATERAL FOREARM MILD/FREE   Atypical nevi 02/23/2016   LEFT THIGH MODERATE/FREE   Atypical nevi 02/23/2016   LEFT MEDIAL LEG MODERATE/FREE   Atypical nevi 02/23/2016   LEFT UPPER BACK MILD /FREE   Atypical nevi 08/23/2016   RIGHT ANT PROX THIGH MILD /FREE   Atypical nevi 07/25/2018   MID LOWER BACK MODERATE   Atypical nevi 01/22/2019   LEFT MID BACK MILD /FREE   Atypical nevi 01/22/2019   LEFT NECK MILD   Atypical nevi 08/06/2019   LEFT INNER BREAST MODERATE W/S   Atypical nevi  08/06/2019   LEFT OUTER SIDE MILD/FREE   Blood transfusion without reported diagnosis    hx of   Breast cancer (HCC) 2022   CAD (coronary artery disease), native coronary artery 02/07/2019   Minimal CAD at the time of cath in 2016 in [place]    Family history of breast cancer 08/19/2021   Family history of pancreatic cancer 08/19/2021   Fatty liver 05/21/2020   Gastric polyps 10/02/2018   EGD 03/2015; benign   GERD (gastroesophageal reflux disease) 10/02/2018   on meds   History of melanoma 10/02/2018   Abdomen 2012; 2013; s/p local excisions.    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 DISTAL LOWER LEG TREATED BY DR. Duaine German   Sleep apnea    Sleep apnea 05/26/2020   uses CPAP   Tricuspid regurgitation    moderate by echo 04/2023  SURGICAL HISTORY: Past Surgical History:  Procedure Laterality Date   ABDOMINAL HYSTERECTOMY  1990   APPENDECTOMY  1962   BLADDER SUSPENSION  1990   BREAST LUMPECTOMY Left 08/2021   BREAST LUMPECTOMY WITH RADIOACTIVE SEED LOCALIZATION Left 09/09/2021   Procedure: LEFT BREAST LUMPECTOMY WITH RADIOACTIVE SEED LOCALIZATION X2;  Surgeon: Dareen Ebbing, MD;  Location: [facility];  Service: General;  Laterality: Left;   COLONOSCOPY  2016   in [place]-hx of polyps   EYE SURGERY  2013 torn retina laser   IR THORACENTESIS ASP PLEURAL SPACE W/IMG GUIDE  02/07/2024   MENISCUS REPAIR Right 2001   right knee   RIGHT HEART CATH AND CORONARY ANGIOGRAPHY N/A 06/20/2023   Procedure: RIGHT HEART CATH AND CORONARY ANGIOGRAPHY;  Surgeon: Darlis Eisenmenger, MD;  Location: [facility]VAS[facility];  Service: Cardiovascular;  Laterality: N/A;   thumb surgery Left    TONSILLECTOMY AND ADENOIDECTOMY     TRANSESOPHAGEAL ECHOCARDIOGRAM (CATH LAB) N/A 01/04/2024   Procedure: TRANSESOPHAGEAL ECHOCARDIOGRAM;  Surgeon: Jann Melody, MD;  Location: [facility];  Service: Cardiovascular;  Laterality: N/A;   tuabl ligation     TUBAL LIGATION  1975   WISDOM TOOTH EXTRACTION      I have reviewed the social history and family history with the patient and they are unchanged from previous note.  ALLERGIES:  is allergic to morphine, metoprolol , nebivolol , and morphine and codeine.  MEDICATIONS:  Current Outpatient Medications  Medication Sig Dispense Refill   anastrozole  (ARIMIDEX ) 1 MG tablet Take 1 tablet (1 mg total) by mouth daily. 90 tablet 1   diltiazem  (CARDIZEM  CD) 300 MG 24 hr capsule Take 1 capsule (300 mg total) by mouth daily. 90 capsule 3   ezetimibe  (ZETIA ) 10 MG tablet Take 1 tablet (10 mg total) by mouth daily. 90 tablet 3   omeprazole  (PRILOSEC) 20 MG capsule Take 20 mg by mouth daily.     palbociclib (IBRANCE) 75 MG tablet Take 1 tablet (75 mg total) by mouth daily. Take for 21 days on, 7 days off, repeat every 28 days. 21 tablet 0   Prenatal Vit-Fe Fumarate-FA (PRENATAL VITAMIN PO) Take by mouth daily. Prenatal w/folic acid po daily     Probiotic Product (ADVANCED PROBIOTIC PO) Take 1 capsule by mouth daily.     rosuvastatin  (CRESTOR ) 20 MG tablet Take 1 tablet (20 mg total) by mouth daily. 90 tablet 3   No current facility-administered medications for this visit.    PHYSICAL EXAMINATION: ECOG PERFORMANCE STATUS: 2 - Symptomatic, <50% confined to bed  Vitals:   02/12/24 1026  BP: 130/60  Pulse: 94  Resp: 15  Temp: 98.3 F (36.8 C)  SpO2: 94%   Wt Readings from Last 3 Encounters:  02/12/24 139 lb 11.2 oz (63.4 kg)  01/15/24 139 lb 9.6 oz (63.3 kg)  01/02/24 142 lb 12.8 oz (64.8 kg)     GENERAL:alert, no  distress and comfortable SKIN: skin color, texture, turgor are normal, no rashes or significant lesions EYES: normal, Conjunctiva are pink and non-injected, sclera clear NECK: supple, thyroid  normal size, non-tender, without nodularity LYMPH:  no palpable lymphadenopathy in the cervical, axillary  LUNGS: clear to auscultation and percussion with normal breathing effort, with decreased breath sound bilateral lung base HEART: regular rate & rhythm and no murmurs and no lower extremity edema ABDOMEN:abdomen soft, non-tender and normal bowel sounds Musculoskeletal:no cyanosis of digits and no clubbing  NEURO: alert & oriented x 3 with fluent speech,  no focal motor/sensory deficits  Physical Exam    LABORATORY DATA:  I have reviewed the data as listed    Latest Ref Rng & Units 02/12/2024   10:14 AM 01/15/2024    2:19 PM 01/04/2024    4:14 PM  CBC  WBC 4.0 - 10.5 K/uL 4.7  4.0  4.4   Hemoglobin 12.0 - 15.0 g/dL 40.9  81.1  7.9   Hematocrit 36.0 - 46.0 % 29.8  32.1  24.2   Platelets 150 - 400 K/uL 217  140  150         Latest Ref Rng & Units 02/12/2024   10:14 AM 01/19/2024   10:36 AM 01/15/2024    2:19 PM  CMP  Glucose 70 - 99 mg/dL 914  782  956   BUN 8 - 23 mg/dL 13  12  17    Creatinine 0.44 - 1.00 mg/dL 2.13  0.86  5.78   Sodium 135 - 145 mmol/L 139  141  140   Potassium 3.5 - 5.1 mmol/L 3.4  4.0  3.2   Chloride 98 - 111 mmol/L 103  103  105   CO2 22 - 32 mmol/L 27  21  26    Calcium  8.9 - 10.3 mg/dL 9.3  46.9  9.1   Total Protein 6.5 - 8.1 g/dL 6.6   6.8   Total Bilirubin 0.0 - 1.2 mg/dL 1.0   1.0   Alkaline Phos 38 - 126 U/L 101   94   AST 15 - 41 U/L 25   27   ALT 0 - 44 U/L 18   16       RADIOGRAPHIC STUDIES: I have personally reviewed the radiological images as listed and agreed with the findings in the report. No results found.    Orders Placed This Encounter  Procedures   US  Soft Tissue Head/Neck    Standing Status:   Future    Expected Date:   02/26/2024     Expiration Date:   02/11/2025    Reason for Exam (SYMPTOM  OR DIAGNOSIS REQUIRED):   abnormal thyroid  uptake on recent cerianna  PET    Preferred imaging location?:   [facility]   Ambulatory Referral to [facility] Nutrition    Referral Priority:   Routine    Referral Type:   Consultation    Referral Reason:   Specialty Services Required    Number of Visits Requested:   1   All questions were answered. The patient knows to call the clinic with any problems, questions or concerns. No barriers to learning was detected. The total time spent in the appointment was 40 minutes, including review of chart and various tests results, discussions about plan of care and coordination of care plan     Sonja Sibley, MD 02/12/2024

## 2024-02-12 NOTE — Telephone Encounter (Signed)
 Oral Oncology Pharmacist Encounter   Received notification that prior authorization for Madison Clements is required.   PA submitted on CoverMyMeds Key: [id number] Status is pending   Oral Oncology Clinic will continue to follow.   Jude Norton, PharmD, BCPS, BCOP Hematology/Oncology Clinical Pharmacist [facility] and [facility] Oral Chemotherapy Navigation Clinics [phone] 02/12/2024 11:16 AM

## 2024-02-12 NOTE — Progress Notes (Signed)
 Specialty Pharmacy Initial Fill Coordination Note  Madison Clements is a 78 y.o. female contacted today regarding refills of specialty medication(s) Palbociclib Madison Clements) .  Patient requested Madison Clements at [facility] [place]  on 02/14/24   Medication will be filled on 02/13/24.   Patient is aware of $0 copayment. [employer] [facility]

## 2024-02-12 NOTE — Progress Notes (Signed)
 Oral Chemotherapy Pharmacist Encounter  Patient was counseled under telephone encounter from 02/12/24.  Jude Norton, PharmD, BCPS, BCOP Hematology/Oncology Clinical Pharmacist [facility] and [facility] Oral Chemotherapy Navigation Clinics [phone] 02/12/2024 3:58 PM

## 2024-02-12 NOTE — Telephone Encounter (Signed)
 Oral Oncology Pharmacist Encounter  Was successful in securing patient a $7500 grant from [facility] to provide copayment coverage for [place]. This will keep the out of pocket expense at $0.     [employer] ID: [id number]   The billing information is as follows and has been shared with [facility].    RxBin: [id number] PCN: [id number] Member ID: [id number] Group ID: [id number] Dates of Eligibility: 01/13/2024 through 01/11/2025  Fund: Breast  Jude Norton, PharmD, BCPS, [facility] Hematology/Oncology Clinical Pharmacist [facility] and [facility] Oral Chemotherapy Navigation Clinics [phone] 02/12/2024 11:23 AM

## 2024-02-12 NOTE — Telephone Encounter (Signed)
 Oral Oncology Pharmacist Encounter  Received new prescription for Ibrance (palbociclib) for the treatment of metastatic HR positive, HER-2 negative breast cancer in conjunction with anastrozole , planned duration until disease progression or unacceptable drug toxicity.  CBC w/ Diff and CMP from 02/12/24 assessed, patient CrCl ~39.2 mL/min - no renal dose adjustments required. Prescription dose and frequency assessed for appropriateness.  Current medication list in Epic reviewed, DDIs with Ibrance identified: Category C drug-drug interaction between Ibrance and Diltiazem  - diltiazem , a moderate CYP3A4 inhibitor may cause increase serum concentrations of Ibrance, thus possible increased side effects. Patient's labs currently stable - patient starting on lowest dose of Ibrance and will have close lab f/u in office. No changes in therapy warranted at this time.   Evaluated chart and no patient barriers to medication adherence noted.   Prescription has been e-scribed to the [facility] for benefits analysis and approval.  Oral Oncology Clinic will continue to follow for insurance authorization, copayment issues, initial counseling and start date.  Jude Norton, PharmD, BCPS, BCOP Hematology/Oncology Clinical Pharmacist [facility] and [facility] Oral Chemotherapy Navigation Clinics [phone] 02/12/2024 12:49 PM

## 2024-02-12 NOTE — Patient Instructions (Signed)
 Vitamin B12 Deficiency Vitamin B12 deficiency occurs when the body does not have enough of this important vitamin. The body needs this vitamin: To make red blood cells. To make DNA. This is the genetic material inside cells. To help the nerves work properly so they can carry messages from the brain to the body. Vitamin B12 deficiency can cause health problems, such as not having enough red blood cells in the blood (anemia). This can lead to nerve damage if untreated. What are the causes? This condition may be caused by: Not eating enough foods that contain vitamin B12. Not having enough stomach acid and digestive fluids to properly absorb vitamin B12 from the food that you eat. Having certain diseases that make it hard to absorb vitamin B12. These diseases include Crohn's disease, chronic pancreatitis, and cystic fibrosis. An autoimmune disorder in which the body does not make enough of a protein (intrinsic factor) within the stomach, resulting in not enough absorption of vitamin B12. Having a surgery in which part of the stomach or small intestine is removed. Taking certain medicines that make it hard for the body to absorb vitamin B12. These include: Heartburn medicines, such as antacids and proton pump inhibitors. Some medicines that are used to treat diabetes. What increases the risk? The following factors may make you more likely to develop a vitamin B12 deficiency: Being an older adult. Eating a vegetarian or vegan diet that does not include any foods that come from animals. Eating a poor diet while you are pregnant. Taking certain medicines. Having alcoholism. What are the signs or symptoms? In some cases, there are no symptoms of this condition. If the condition leads to anemia or nerve damage, various symptoms may occur, such as: Weakness. Tiredness (fatigue). Loss of appetite. Numbness or tingling in your hands and feet. Redness and burning of the tongue. Depression,  confusion, or memory problems. Trouble walking. If anemia is severe, symptoms can include: Shortness of breath. Dizziness. Rapid heart rate. How is this diagnosed? This condition may be diagnosed with a blood test to measure the level of vitamin B12 in your blood. You may also have other tests, including: A group of tests that measure certain characteristics of blood cells (complete blood count, CBC). A blood test to measure intrinsic factor. A procedure where a thin tube with a camera on the end is used to look into your stomach or intestines (endoscopy). Other tests may be needed to discover the cause of the deficiency. How is this treated? Treatment for this condition depends on the cause. This condition may be treated by: Changing your eating and drinking habits, such as: Eating more foods that contain vitamin B12. Drinking less alcohol or no alcohol. Getting vitamin B12 injections. Taking vitamin B12 supplements by mouth (orally). Your health care provider will tell you which dose is best for you. Follow these instructions at home: Eating and drinking  Include foods in your diet that come from animals and contain a lot of vitamin B12. These include: Meats and poultry. This includes beef, pork, chicken, [place], and organ meats, such as liver. Seafood. This includes clams, rainbow trout, salmon, tuna, and haddock. Eggs. Dairy foods such as milk, yogurt, and cheese. Eat foods that have vitamin B12 added to them (are fortified), such as ready-to-eat breakfast cereals. Check the label on the package to see if a food is fortified. The items listed above may not be a complete list of foods and beverages you can eat and drink. Contact a dietitian for  more information. Alcohol use Do not drink alcohol if: Your health care provider tells you not to drink. You are pregnant, may be pregnant, or are planning to become pregnant. If you drink alcohol: Limit how much you have to: 0-1 drink a  day for women. 0-2 drinks a day for men. Know how much alcohol is in your drink. In the [place] one drink equals one 12 oz bottle of beer (355 mL), one 5 oz glass of wine (148 mL), or one 1 oz glass of hard liquor (44 mL). General instructions Get vitamin B12 injections if told to by your health care provider. Take supplements only as told by your health care provider. Follow the directions carefully. Keep all follow-up visits. This is important. Contact a health care provider if: Your symptoms come back. Your symptoms get worse or do not improve with treatment. Get help right away: You develop shortness of breath. You have a rapid heart rate. You have chest pain. You become dizzy or you faint. These symptoms may be an emergency. Get help right away. Call 911. Do not wait to see if the symptoms will go away. Do not drive yourself to the hospital. Summary Vitamin B12 deficiency occurs when the body does not have enough of this important vitamin. Common causes include not eating enough foods that contain vitamin B12, not being able to absorb vitamin B12 from the food that you eat, having a surgery in which part of the stomach or small intestine is removed, or taking certain medicines. Eat foods that have vitamin B12 in them. Treatment may include making a change in the way you eat and drink, getting vitamin B12 injections, or taking vitamin B12 supplements. This information is not intended to replace advice given to you by your health care provider. Make sure you discuss any questions you have with your health care provider. Document Revised: 04/23/2021 Document Reviewed: 04/23/2021 Elsevier Patient Education  2024 [employer].

## 2024-02-12 NOTE — Telephone Encounter (Signed)
 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   Counseled patient on administration, dosing, side effects, monitoring, drug-food interactions, safe handling, storage, and disposal.  Patient will take Ibrance 75mg  tablets, 1 tablet by mouth once daily, with or without food, taken for 3 weeks on, 1 week off, and repeated.  Patient knows to avoid grapefruit and grapefruit juice while on treatment with Ibrance.  Ibrance start date: 02/19/24  Adverse effects include but are not limited to: fatigue, GI upset, nausea, decreased blood counts Diarrhea: Patient will obtain anti diarrheal and alert the office of 4 or more loose stools above baseline. Nausea: will reach out to MD for patient to have a PRN antiemetic on hand at home.   Reviewed with patient importance of keeping a medication schedule and plan for any missed doses. No barriers to medication adherence identified.  Medication reconciliation performed and medication/allergy list updated.  All questions answered.  Madison Clements voiced understanding and appreciation.   Medication education handout placed in mail for patient. Patient knows to call the office with questions or concerns. Oral Chemotherapy Clinic phone number provided to patient.   Jude Norton, PharmD, BCPS, BCOP Hematology/Oncology Clinical Pharmacist [facility] Oral Chemotherapy Navigation Clinics [phone] 02/12/2024 3:53 PM

## 2024-02-12 NOTE — Assessment & Plan Note (Addendum)
 Stage IA, pT2, cN0, ER+/PR+/HER2-, Grade 2, RS 21, malignant pleural effusion in 12/2023 -she was under short-term f/u for left breast asymmetry. Biopsy 08/11/21 confirmed invasive mammary carcinoma. -left lumpectomy on 09/09/21 by Dr. 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 No nodes were removed due to her age. -she received adjuvant RT, completed in 11/2021 -she started adjuvant tamoxifen  in 12/2021, but could not tolerate. She subsequently tried anastrozole  and still can not tolerate. She stopped in 04/2022 -Screening mammogram in May 2024 were negative. - She developed pleural effusion in April 2025, cytology unfortunately showed malignant cells, consistent with breast primary. ERand PR moderately positive, Her2 negative (IHC 0) -FDG PET 01/08/2024 was negative for hypermetabolic lesion or hypermetabolism around the pleura.  [place]  PET scan 01/26/2024 showed diffuse bone mets and mediastinal adenopathy. Pleura was negative on PET -her skin (scalp) biopsy also showed metastatic breast cancer, ER and PR positive, HER2 negative.   -She has started anastrozole  in 12/2023  - Due to her recurrent pleural effusion, and high disease burden, I will add a low-dose Ibrance for better disease control - Continue Zometa , will change to every 3 months due to her diffuse bone metastasis.

## 2024-02-13 NOTE — Addendum Note (Signed)
 Addended by: Sonja Theba on: 02/13/2024 07:30 AM   Modules accepted: Orders

## 2024-02-14 ENCOUNTER — Ambulatory Visit (HOSPITAL_COMMUNITY)
Admission: RE | Admit: 2024-02-14 | Discharge: 2024-02-14 | Disposition: A | Discharge status: Home / Self Care | Source: Ambulatory Visit | Attending: Hematology | Admitting: Hematology

## 2024-02-14 ENCOUNTER — Other Ambulatory Visit: Payer: Self-pay

## 2024-02-14 ENCOUNTER — Other Ambulatory Visit (HOSPITAL_COMMUNITY): Payer: Self-pay

## 2024-02-14 DIAGNOSIS — Z170 Estrogen receptor positive status [ER+]: Secondary | ICD-10-CM | POA: Diagnosis not present

## 2024-02-14 DIAGNOSIS — E042 Nontoxic multinodular goiter: Secondary | ICD-10-CM | POA: Diagnosis not present

## 2024-02-14 DIAGNOSIS — C50812 Malignant neoplasm of overlapping sites of left female breast: Secondary | ICD-10-CM | POA: Diagnosis not present

## 2024-02-15 ENCOUNTER — Other Ambulatory Visit (HOSPITAL_COMMUNITY): Payer: Self-pay

## 2024-02-15 ENCOUNTER — Other Ambulatory Visit: Payer: Self-pay

## 2024-02-19 ENCOUNTER — Other Ambulatory Visit: Payer: Self-pay

## 2024-02-19 DIAGNOSIS — C50812 Malignant neoplasm of overlapping sites of left female breast: Secondary | ICD-10-CM

## 2024-02-19 NOTE — Progress Notes (Signed)
 Verbal order with readback from Dr. Maryalice Smaller for US  Soft Tissue Head/Neck to be done by 02/26/2024.  Order placed and scheduling message sent to Central Scheduling for to contact pt to schedule.

## 2024-02-20 ENCOUNTER — Inpatient Hospital Stay: Admitting: Nutrition

## 2024-02-20 NOTE — Progress Notes (Signed)
 78 year old female diagnosed with metastatic breast cancer and followed by Dr. Maryalice Smaller.  Patient began anastrozole  in April 2025.  Noted Ibrance  added.  Patient is also on Zometa  to strengthen bones and decrease fracture risk.  Past medical history includes CAD, GERD, hyperlipidemia, hypertension, melanoma, osteopenia, radiation therapy.  Medications include Arimidex , Prilosec, Zofran , Ibrance , prenatal vitamin, probiotic.  Labs on June 2 include potassium 3.4, glucose 110, B12 337.  Height: 5 feet 2 inches. Weight: 143.4 pounds increased from 139 pounds June 2. (Question related to fluid accumulation/pleural effusion) Usual body weight: 153 pounds December, 2024, BMI: 26.23  Referral received for weight loss.  Patient has lost approximately 15 pounds due to poor appetite.  She believes most of her weight loss occurred when she was ill in February and March with what she believes was COVID.  She notices increased satiety before thoracentesis.  Certain foods no longer appeal to her however she will eat most fruit, yogurt, cottage cheese with Jell-O, shrimp, hamburgers, and country ham.  She has lost the taste for chocolate.  Currently denies problems chewing and swallowing.  No nausea, vomiting, constipation, or diarrhea.  Reports ongoing fatigue.  Nutrition diagnosis: Unintended weight loss related to inadequate oral intake as evidenced by 6% weight loss over 6 months.  Intervention: Educated on strategies for increasing calories and protein in small frequent meals and snacks. Reviewed high-protein foods.  Encouraged choosing something with protein 5-6 times daily. Recommended oral nutrition supplements and provided samples of Ensure clear, boost resource breeze, Ensure complete, and Carnation breakfast essentials.  Provided coupons. Recommended change milk to fair life milk ultrafiltered for additional protein/low lactose.   Reviewed tips for diarrhea. Nutrition fact sheets provided.  Contact  information given.  Monitoring, evaluation, goals: Tolerate increased calories and protein for weight maintenance/weight gain.  Next visit: Patient will contact RD for questions or concerns.  **Disclaimer: This note was dictated with voice recognition software. Similar sounding words can inadvertently be transcribed and this note may contain transcription errors which may not have been corrected upon publication of note.**

## 2024-02-22 ENCOUNTER — Encounter (HOSPITAL_COMMUNITY): Payer: Self-pay

## 2024-02-22 ENCOUNTER — Ambulatory Visit (HOSPITAL_COMMUNITY)
Admission: RE | Admit: 2024-02-22 | Discharge: 2024-02-22 | Disposition: A | Discharge status: Home / Self Care | Source: Ambulatory Visit | Attending: Hematology | Admitting: Hematology

## 2024-02-22 ENCOUNTER — Other Ambulatory Visit: Payer: Self-pay

## 2024-02-22 ENCOUNTER — Other Ambulatory Visit: Payer: Self-pay | Admitting: Nurse Practitioner

## 2024-02-22 DIAGNOSIS — E042 Nontoxic multinodular goiter: Secondary | ICD-10-CM

## 2024-02-22 DIAGNOSIS — Z170 Estrogen receptor positive status [ER+]: Secondary | ICD-10-CM

## 2024-02-22 NOTE — Progress Notes (Signed)
 Patient called in stating she got her results from her Thyroid  US  and it stated she needed to have  fine-needle aspiration biopsy. Wanted to know when we could set it up. I spoke to Rande Bushy NP about the results and she placed the order. I messaged Melissa Xayasine to set up the biopsy she stated they do this typically at GI so she forwarded the order over to them. They will contact patient to have it scheduled. Called patient and made her aware she would be receiving a call from scheduling to set everything up. She had no further questions at this time.

## 2024-02-22 NOTE — Progress Notes (Signed)
 US  FNA thyroid  biopsy of nodule #6 in left lobe of thyroid  ordered today tod be done at [facility].  -Rande Bushy, NP

## 2024-02-26 ENCOUNTER — Ambulatory Visit (HOSPITAL_COMMUNITY): Admit: 2024-02-26 | Admitting: Orthopedic Surgery

## 2024-02-26 ENCOUNTER — Ambulatory Visit
Admission: RE | Admit: 2024-02-26 | Discharge: 2024-02-26 | Disposition: A | Discharge status: Home / Self Care | Source: Ambulatory Visit | Attending: Nurse Practitioner | Admitting: Nurse Practitioner

## 2024-02-26 ENCOUNTER — Other Ambulatory Visit (HOSPITAL_COMMUNITY)
Admission: RE | Admit: 2024-02-26 | Discharge: 2024-02-26 | Disposition: A | Discharge status: Home / Self Care | Source: Ambulatory Visit | Attending: Physician Assistant | Admitting: Physician Assistant

## 2024-02-26 DIAGNOSIS — E042 Nontoxic multinodular goiter: Secondary | ICD-10-CM | POA: Diagnosis not present

## 2024-02-26 DIAGNOSIS — E041 Nontoxic single thyroid nodule: Secondary | ICD-10-CM | POA: Diagnosis not present

## 2024-02-26 SURGERY — ARTHROPLASTY, KNEE, TOTAL
Anesthesia: Choice | Site: Knee | Laterality: Right

## 2024-02-28 ENCOUNTER — Ambulatory Visit: Payer: Self-pay | Admitting: Nurse Practitioner

## 2024-02-28 ENCOUNTER — Ambulatory Visit (HOSPITAL_COMMUNITY)
Admission: RE | Admit: 2024-02-28 | Discharge: 2024-02-28 | Disposition: A | Discharge status: Home / Self Care | Source: Ambulatory Visit | Attending: Hematology | Admitting: Hematology

## 2024-02-28 ENCOUNTER — Ambulatory Visit (HOSPITAL_COMMUNITY)
Admission: RE | Admit: 2024-02-28 | Discharge: 2024-02-28 | Disposition: A | Discharge status: Home / Self Care | Source: Ambulatory Visit | Attending: Urology | Admitting: Urology

## 2024-02-28 ENCOUNTER — Telehealth: Payer: Self-pay | Admitting: Nurse Practitioner

## 2024-02-28 ENCOUNTER — Other Ambulatory Visit: Payer: Self-pay

## 2024-02-28 DIAGNOSIS — R918 Other nonspecific abnormal finding of lung field: Secondary | ICD-10-CM | POA: Diagnosis not present

## 2024-02-28 DIAGNOSIS — C50812 Malignant neoplasm of overlapping sites of left female breast: Secondary | ICD-10-CM | POA: Diagnosis not present

## 2024-02-28 DIAGNOSIS — Z48813 Encounter for surgical aftercare following surgery on the respiratory system: Secondary | ICD-10-CM | POA: Diagnosis not present

## 2024-02-28 DIAGNOSIS — Z170 Estrogen receptor positive status [ER+]: Secondary | ICD-10-CM | POA: Diagnosis not present

## 2024-02-28 DIAGNOSIS — J90 Pleural effusion, not elsewhere classified: Secondary | ICD-10-CM | POA: Insufficient documentation

## 2024-02-28 LAB — CYTOLOGY - NON PAP

## 2024-02-28 MED ORDER — LIDOCAINE HCL 1 % IJ SOLN
INTRAMUSCULAR | Status: AC
Start: 2024-02-28 — End: 2024-02-28
  Filled 2024-02-28: qty 20

## 2024-02-28 NOTE — Telephone Encounter (Signed)
 Spoke to patient on the phone about thyroid  biopsy. Explained that cells showed atypia of unclear significance. Currently, there is not evidence of malignancy, specifically, no evidence of breast cancer cells. I explained that a biopsy would likeoy be repeated in 6-12 months. It could be repeated sooner if there were any new findings or symptoms. She voiced understanding. She had no further questions.  -Rande Bushy, NP

## 2024-02-28 NOTE — Procedures (Signed)
 PROCEDURE SUMMARY:  Successful image-guided left-sided therapeutic thoracentesis. Yielded 0.825 liters of clear, straw-colored pleural fluid. Patient tolerated procedure well. EBL: Zero No immediate complications.  Post procedure CXR shows no pneumothorax.  Please see imaging section of Epic for full dictation.  Odaliz Mcqueary A Leni Pankonin PA-C 02/28/2024 2:25 PM

## 2024-03-01 ENCOUNTER — Other Ambulatory Visit: Payer: Self-pay

## 2024-03-04 ENCOUNTER — Other Ambulatory Visit (HOSPITAL_COMMUNITY): Payer: Self-pay

## 2024-03-04 ENCOUNTER — Other Ambulatory Visit: Payer: Self-pay

## 2024-03-04 ENCOUNTER — Encounter: Payer: Self-pay | Admitting: Nurse Practitioner

## 2024-03-04 ENCOUNTER — Inpatient Hospital Stay

## 2024-03-04 ENCOUNTER — Encounter (INDEPENDENT_AMBULATORY_CARE_PROVIDER_SITE_OTHER): Payer: Self-pay

## 2024-03-04 ENCOUNTER — Telehealth: Payer: Self-pay | Admitting: Nurse Practitioner

## 2024-03-04 ENCOUNTER — Inpatient Hospital Stay (HOSPITAL_BASED_OUTPATIENT_CLINIC_OR_DEPARTMENT_OTHER): Admitting: Nurse Practitioner

## 2024-03-04 ENCOUNTER — Other Ambulatory Visit: Payer: Self-pay | Admitting: Hematology

## 2024-03-04 VITALS — BP 138/58 | HR 72 | Temp 98.0°F | Resp 17 | Wt 142.7 lb

## 2024-03-04 DIAGNOSIS — J910 Malignant pleural effusion: Secondary | ICD-10-CM | POA: Diagnosis not present

## 2024-03-04 DIAGNOSIS — C50812 Malignant neoplasm of overlapping sites of left female breast: Secondary | ICD-10-CM | POA: Diagnosis not present

## 2024-03-04 DIAGNOSIS — Z170 Estrogen receptor positive status [ER+]: Secondary | ICD-10-CM

## 2024-03-04 DIAGNOSIS — M8580 Other specified disorders of bone density and structure, unspecified site: Secondary | ICD-10-CM

## 2024-03-04 DIAGNOSIS — Z1721 Progesterone receptor positive status: Secondary | ICD-10-CM | POA: Diagnosis not present

## 2024-03-04 DIAGNOSIS — D519 Vitamin B12 deficiency anemia, unspecified: Secondary | ICD-10-CM

## 2024-03-04 DIAGNOSIS — C7951 Secondary malignant neoplasm of bone: Secondary | ICD-10-CM | POA: Diagnosis not present

## 2024-03-04 LAB — COMPREHENSIVE METABOLIC PANEL WITH GFR
ALT: 15 U/L (ref 0–44)
AST: 22 U/L (ref 15–41)
Albumin: 4.1 g/dL (ref 3.5–5.0)
Alkaline Phosphatase: 80 U/L (ref 38–126)
Anion gap: 10 (ref 5–15)
BUN: 12 mg/dL (ref 8–23)
CO2: 23 mmol/L (ref 22–32)
Calcium: 9.3 mg/dL (ref 8.9–10.3)
Chloride: 108 mmol/L (ref 98–111)
Creatinine, Ser: 1.1 mg/dL — ABNORMAL HIGH (ref 0.44–1.00)
GFR, Estimated: 51 mL/min — ABNORMAL LOW (ref >60)
Glucose, Bld: 114 mg/dL — ABNORMAL HIGH (ref 70–99)
Potassium: 3.4 mmol/L — ABNORMAL LOW (ref 3.5–5.1)
Sodium: 141 mmol/L (ref 135–145)
Total Bilirubin: 0.9 mg/dL (ref 0.0–1.2)
Total Protein: 6.4 g/dL — ABNORMAL LOW (ref 6.5–8.1)

## 2024-03-04 LAB — CBC WITH DIFFERENTIAL/PLATELET
Abs Immature Granulocytes: 0.01 10*3/uL (ref 0.00–0.07)
Basophils Absolute: 0 10*3/uL (ref 0.0–0.1)
Basophils Relative: 1 %
Eosinophils Absolute: 0 10*3/uL (ref 0.0–0.5)
Eosinophils Relative: 1 %
HCT: 29.5 % — ABNORMAL LOW (ref 36.0–46.0)
Hemoglobin: 10.2 g/dL — ABNORMAL LOW (ref 12.0–15.0)
Immature Granulocytes: 1 %
Lymphocytes Relative: 51 %
Lymphs Abs: 0.8 10*3/uL (ref 0.7–4.0)
MCH: 33.7 pg (ref 26.0–34.0)
MCHC: 34.6 g/dL (ref 30.0–36.0)
MCV: 97.4 fL (ref 80.0–100.0)
Monocytes Absolute: 0.1 10*3/uL (ref 0.1–1.0)
Monocytes Relative: 7 %
Neutro Abs: 0.6 10*3/uL — ABNORMAL LOW (ref 1.7–7.7)
Neutrophils Relative %: 39 %
Platelets: 113 10*3/uL — ABNORMAL LOW (ref 150–400)
RBC: 3.03 MIL/uL — ABNORMAL LOW (ref 3.87–5.11)
RDW: 18.2 % — ABNORMAL HIGH (ref 11.5–15.5)
Smear Review: NORMAL
WBC: 1.6 10*3/uL — ABNORMAL LOW (ref 4.0–10.5)
nRBC: 1.3 % — ABNORMAL HIGH (ref 0.0–0.2)

## 2024-03-04 LAB — SAMPLE TO BLOOD BANK

## 2024-03-04 LAB — VITAMIN B12: Vitamin B-12: 316 pg/mL (ref 180–914)

## 2024-03-04 MED ORDER — SODIUM CHLORIDE 0.9 % IV SOLN: Freq: Once | INTRAVENOUS | Status: AC

## 2024-03-04 MED ORDER — ZOLEDRONIC ACID 4 MG/100ML IV SOLN
4.0000 mg | Freq: Once | INTRAVENOUS | Status: AC
  Administered 2024-03-04: 4 mg via INTRAVENOUS
  Filled 2024-03-04: qty 100

## 2024-03-04 MED ORDER — PALBOCICLIB 75 MG PO TABS
75.0000 mg | ORAL_TABLET | Freq: Every day | ORAL | 0 refills | Status: DC
  Filled 2024-03-04: qty 21, 28d supply, fill #0

## 2024-03-04 MED ORDER — PALBOCICLIB 75 MG PO TABS
75.0000 mg | ORAL_TABLET | Freq: Every day | ORAL | 0 refills | Status: DC
  Filled 2024-03-05: qty 14, 21d supply, fill #0

## 2024-03-04 NOTE — Progress Notes (Signed)
 Specialty Pharmacy Refill Coordination Note  Spoke with Flis, Othella Sylvia Sylvia (Self).   Sylvia Behl is a 78 y.o. female contacted today regarding refills of specialty medication(s) Ibrance .  Next cycle approx: 03/18/24.   Patient requested: (Patient-Rptd) Pickup at [facility] date: (Patient-Rptd) 03/08/24  Medication will be filled on 03/13/24.

## 2024-03-04 NOTE — Progress Notes (Signed)
 Specialty Pharmacy Ongoing Clinical Assessment Note  Madison Clements is a 78 y.o. female who is being followed by the specialty pharmacy service for RxSp Oncology   Patient's specialty medication(s) reviewed today: Palbociclib  (IBRANCE )   Missed doses in the last 4 weeks: 0   Patient/Caregiver did not have any additional questions or concerns.   Therapeutic benefit summary: Unable to assess   Adverse events/side effects summary: Experienced adverse events/side effects (neutropenia - provider lowered dose to 2 weeks on/1 week off. mild skin itching - provider advised to report if it turns into a rash)   Patient's therapy is appropriate to: Continue    Goals Addressed             This Visit's Progress    Slow Disease Progression       Patient is unable to be assessed as therapy was recently initiated. Patient will maintain adherence         Follow up: 3 months  Pauletta Pickney M Kate Larock Specialty Pharmacist

## 2024-03-04 NOTE — Telephone Encounter (Signed)
 Scheduled appointments per 6/23 los. Talked with the patient and she is aware of the made appointments.

## 2024-03-04 NOTE — Progress Notes (Addendum)
 Patient will pick up on 03/14/24.  This fill date is pending response to refill request from provider. Patient is aware and if they have not received fill by intended date, they must follow up with pharmacy.

## 2024-03-04 NOTE — Addendum Note (Signed)
 Addended by: Allyce Bochicchio K on: 03/04/2024 04:22 PM   Modules accepted: Orders

## 2024-03-04 NOTE — Patient Instructions (Signed)

## 2024-03-04 NOTE — Progress Notes (Signed)
 [facility]   [phone] [phone]    Patient Care Team: [name], MD as PCP - General (Family Medicine) [name], MD as PCP - Cardiology (Cardiology) [name], MD as Consulting Physician (Cardiology) [name], MD as Consulting Physician (Ophthalmology) [name], PA-C as Physician Assistant (Dermatology) [name], MD as Consulting Physician (General Surgery) [name], MD as Consulting Physician (Hematology) [name], MD as Attending Physician (Radiation Oncology) [name], [facility] (Inactive) (Pharmacist)   CHIEF COMPLAINT: Follow-up metastatic breast cancer  Oncology History Overview Note   Cancer Staging  Malignant neoplasm of overlapping sites of left breast in female, estrogen receptor positive (HCC) Staging form: Breast, AJCC 8th Edition - Clinical stage from 08/11/2021: Stage IA (cT1c, cN0, cM0, G2, ER+, PR+, HER2-) - Signed by [name], MD on 08/18/2021     Malignant neoplasm of overlapping sites of left breast in female, estrogen receptor positive (HCC)  07/27/2021 Mammogram   EXAM: DIGITAL DIAGNOSTIC UNILATERAL LEFT MAMMOGRAM WITH TOMOSYNTHESIS AND CAD; ULTRASOUND LEFT BREAST LIMITED  IMPRESSION: 1. There is a suspicious mass in the left breast at 9 o'clock measuring 1.8 cm.   2. There is a 1.0 cm group of suspicious linear calcifications anterior to the suspicious mass in the left breast, as well as a faint 2 mm group which lie 1 cm lateral to the linear calcifications.   3.  No evidence of left axillary lymphadenopathy   08/11/2021 Cancer Staging   Staging form: Breast, AJCC 8th Edition - Clinical stage from 08/11/2021: Stage IA (cT1c, cN0, cM0, G2, ER+, PR+, HER2-) - Signed by [name], MD on 08/18/2021 Stage prefix: Initial diagnosis Histologic grading system: 3 grade system   08/11/2021 Initial Biopsy   Diagnosis 1. Breast, left, needle core biopsy, 9  o'clock, ribbon clip - INVASIVE MAMMARY CARCINOMA - SEE COMMENT 2. Breast, left, needle core biopsy, lower inner quadrant, x clip - INVASIVE MAMMARY CARCINOMA - MAMMARY CARCINOMA IN-SITU - CALCIFICATIONS - SEE COMMENT Microscopic Comment 1. The biopsy material shows an infiltrative proliferation of cells with arranged linearly and in small clusters. Based on the biopsy, the carcinoma appears Nottingham grade 2 of 3 and measures 1.2 cm in greatest linear extent. 2. Based on the biopsy, the carcinoma appears Nottingham grade 2 of 3 and measures 0.2 cm in greatest linear extent.  1. PROGNOSTIC INDICATORS Results: The tumor cells are NEGATIVE for Her2 (1+). Estrogen Receptor: 100%, POSITIVE, STRONG STAINING INTENSITY Progesterone Receptor: 60%, POSITIVE, STRONG STAINING INTENSITY Proliferation Marker Ki67: 10%  2. PROGNOSTIC INDICATORS Results: The tumor cells are NEGATIVE for Her2 (1+). Estrogen Receptor: 100%, POSITIVE, STRONG STAINING INTENSITY Progesterone Receptor: 60%, POSITIVE, STRONG STAINING INTENSITY Proliferation Marker Ki67: 15%   08/13/2021 Mammogram   EXAM: DIGITAL DIAGNOSTIC UNILATERAL RIGHT MAMMOGRAM WITH TOMOSYNTHESIS AND CAD  IMPRESSION: No mammographic evidence for malignancy.   08/16/2021 Initial Diagnosis   Malignant neoplasm of overlapping sites of left breast in female, estrogen receptor positive (HCC)   08/25/2021 Imaging   EXAM: BILATERAL BREAST MRI WITH AND WITHOUT CONTRAST  IMPRESSION: 1. Biopsy-proven invasive ductal carcinoma measuring approximately 2.8 x 1.7 x 1.5 cm in the inner breast at middle depth, associated with the ribbon shaped tissue marking clip placed at the time of core needle biopsy. Enhancement extends approximately 1.4 cm posterior to the clip. 2. Approximate 1.8 cm post biopsy hematoma with associated rim enhancement at the site of the biopsy-proven invasive ductal carcinoma and DCIS  in the LOWER INNER QUADRANT at anterior depth  associated with the X shaped tissue marking clip. 3. In combination, the overall enhancement spans approximately 4 cm. 4. No MRI evidence of malignancy involving the RIGHT breast. 5. No pathologic lymphadenopathy.   08/27/2021 Genetic Testing   egative hereditary cancer genetic testing: no pathogenic variants detected in [facility] BRCAPlus Panel and CancerNext-Expanded +RNAinsight Panel.  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  The report dates are 08/27/2021 and 08/30/2021.   The BRCAplus panel offered by [employer] and includes sequencing and deletion/duplication analysis for the following 8 genes: ATM, BRCA1, BRCA2, CDH1, CHEK2, PALB2, PTEN, and TP53.  The CancerNext-Expanded gene panel offered by [facility] and includes sequencing, rearrangement, and RNA analysis for the following 77 genes: AIP, ALK, APC, ATM, AXIN2, BAP1, BARD1, BLM, BMPR1A, BRCA1, BRCA2, BRIP1, CDC73, CDH1, CDK4, CDKN1B, CDKN2A, CHEK2, CTNNA1, DICER1, FANCC, FH, FLCN, GALNT12, KIF1B, LZTR1, MAX, MEN1, MET, MLH1, MSH2, MSH3, MSH6, MUTYH, NBN, NF1, NF2, NTHL1, PALB2, PHOX2B, PMS2, POT1, PRKAR1A, PTCH1, PTEN, RAD51C, RAD51D, RB1, RECQL, RET, SDHA, SDHAF2, SDHB, SDHC, SDHD, SMAD4, SMARCA4, SMARCB1, SMARCE1, STK11, SUFU, TMEM127, TP53, TSC1, TSC2, VHL and XRCC2 (sequencing and deletion/duplication); EGFR, EGLN1, HOXB13, KIT, MITF, PDGFRA, POLD1, and POLE (sequencing only); EPCAM and GREM1 (deletion/duplication only).    09/09/2021 Definitive Surgery   FINAL MICROSCOPIC DIAGNOSIS:   A. BREAST, LEFT, LUMPECTOMY:  -  Invasive ductal carcinoma, Nottingham grade 2 of 3, 3.5 cm  -  Ductal carcinoma in-situ, intermediate grade  -  Margins uninvolved by carcinoma (<0.1 cm; anterior)  -  Previous biopsy site changes present    09/09/2021 Oncotype testing   Oncotype DX was obtained on the final surgical sample and the recurrence score of 21 predicts a risk of recurrence outside the breast over the  next 9 years of 7%, if the patient's only systemic therapy is an antiestrogen for 5 years.  It also predicts no benefit from chemotherapy.   10/20/2021 - 11/16/2021 Radiation Therapy   Site Technique Total Dose (Gy) Dose per Fx (Gy) Completed Fx Beam Energies  Breast, Left: Breast_L_axilla 3D 40.05/40.05 2.67 15/15 10X  Breast, Left: Breast_L_Bst 3D 10/10 2 5/5 6X, 10X     12/06/2021 - 04/2022 Anti-estrogen oral therapy   Tamoxifen  x 5 years unable to tolerate, attempted anastrozole  and had similar side effects.  Opted to forego adjuvant antiestrogen therapy      CURRENT THERAPY:  Anastrozole  1 mg daily, starting 12/2023 Ibrance  75 mg daily 3 weeks on/1 week off Zometa  every 3 months Therapeutic thoracentesis as needed  INTERVAL HISTORY Madison Clements returns for follow-up as scheduled, last seen by Dr. Lanny 02/12/2024, low-dose Ibrance  was added, she just began her third week, tolerating well.  She noticed sporadic itching but no rash.  She has exertional dyspnea and fatigue which increase/become severe when pleural fluid re-accumulates and she needs thoracentesis.  Feels better after the last one on 6/18.  Asking about Pleurx placement. Cough she's had for months has improved. Eating and drinking well.  Denies nausea/vomiting/diarrhea.  Tolerating AI, no significant hot flashes or other side effects.  ROS  All other systems reviewed and negative  Past Medical History:  Diagnosis Date   Allergy 1990   Morphine following surgery   Arthritis    generalized   Atypical mole 02/24/2015   RGHT LATERAL THIGH MODERATE   Atypical mole 01/20/2022   Left Breast (mild)   Atypical mole 01/20/2022   Mid Back (mild)  Atypical nevi 02/24/2015   RIGHT NECK MILD   Atypical nevi 08/25/2015   LEFT UPPER ARM MILD   Atypical nevi 08/25/2015   LEFT LATERAL FOREARM MILD/FREE   Atypical nevi 02/23/2016   LEFT THIGH MODERATE/FREE   Atypical nevi 02/23/2016   LEFT MEDIAL LEG MODERATE/FREE   Atypical nevi  02/23/2016   LEFT UPPER BACK MILD /FREE   Atypical nevi 08/23/2016   RIGHT ANT PROX THIGH MILD /FREE   Atypical nevi 07/25/2018   MID LOWER BACK MODERATE   Atypical nevi 01/22/2019   LEFT MID BACK MILD /FREE   Atypical nevi 01/22/2019   LEFT NECK MILD   Atypical nevi 08/06/2019   LEFT INNER BREAST MODERATE W/S   Atypical nevi 08/06/2019   LEFT OUTER SIDE MILD/FREE   Blood transfusion without reported diagnosis    hx of   Breast cancer (HCC) 2022   CAD (coronary artery disease), native coronary artery 02/07/2019   Minimal CAD at the time of cath in 2016 in [place]    Family history of breast cancer 08/19/2021   Family history of pancreatic cancer 08/19/2021   Fatty liver 05/21/2020   Gastric polyps 10/02/2018   EGD 03/2015; benign   GERD (gastroesophageal reflux disease) 10/02/2018   on meds   History of melanoma 10/02/2018   Abdomen 2012; 2013; s/p local excisions.    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 DISTAL LOWER LEG TREATED BY DR. GEORGENE   Sleep apnea    Sleep apnea 05/26/2020   uses CPAP   Tricuspid regurgitation    moderate by echo 04/2023     Past Surgical History:  Procedure Laterality Date   ABDOMINAL HYSTERECTOMY  1990   APPENDECTOMY  1962   BLADDER SUSPENSION  1990   BREAST LUMPECTOMY Left 08/2021   BREAST LUMPECTOMY WITH RADIOACTIVE SEED LOCALIZATION Left 09/09/2021   Procedure: LEFT BREAST LUMPECTOMY WITH RADIOACTIVE SEED LOCALIZATION X2;  Surgeon: [name], MD;  Location: [facility];  Service: General;  Laterality: Left;    COLONOSCOPY  2016   in [place]-hx of polyps   EYE SURGERY  2013 torn retina laser   IR THORACENTESIS ASP PLEURAL SPACE W/IMG GUIDE  02/07/2024   MENISCUS REPAIR Right 2001   right knee   RIGHT HEART CATH AND CORONARY ANGIOGRAPHY N/A 06/20/2023   Procedure: RIGHT HEART CATH AND CORONARY ANGIOGRAPHY;  Surgeon: Rolan Ezra RAMAN, MD;  Location: [facility];  Service: Cardiovascular;  Laterality: N/A;   thumb surgery Left    TONSILLECTOMY AND ADENOIDECTOMY     TRANSESOPHAGEAL ECHOCARDIOGRAM (CATH LAB) N/A 01/04/2024   Procedure: TRANSESOPHAGEAL ECHOCARDIOGRAM;  Surgeon: Santo Stanly LABOR, MD;  Location: [facility];  Service: Cardiovascular;  Laterality: N/A;   tuabl ligation     TUBAL LIGATION  1975   WISDOM TOOTH EXTRACTION       Outpatient Encounter Medications as of 03/04/2024  Medication Sig   anastrozole  (ARIMIDEX ) 1 MG tablet Take 1 tablet (1 mg total) by mouth daily.   diltiazem  (CARDIZEM  CD) 300 MG  24 hr capsule Take 1 capsule (300 mg total) by mouth daily.   ezetimibe  (ZETIA ) 10 MG tablet Take 1 tablet (10 mg total) by mouth daily.   omeprazole  (PRILOSEC) 20 MG capsule Take 20 mg by mouth daily.   ondansetron  (ZOFRAN ) 8 MG tablet Take 1 tablet (8 mg total) by mouth every 8 (eight) hours as needed for nausea or vomiting.   palbociclib  (IBRANCE ) 75 MG tablet Take 1 tablet (75 mg total) by mouth daily. Take for 21 days on, 7 days off, repeat every 28 days.   Prenatal Vit-Fe Fumarate-FA (PRENATAL VITAMIN PO) Take by mouth daily. Prenatal w/folic acid po daily   Probiotic Product (ADVANCED PROBIOTIC PO) Take 1 capsule by mouth daily.   rosuvastatin  (CRESTOR ) 20 MG tablet Take 1 tablet (20 mg total) by mouth daily.   No facility-administered encounter medications on file as of 03/04/2024.     Today's Vitals   03/04/24 1030 03/04/24 1040  BP: (!) 138/58   Pulse: 72   Resp: 17   Temp: 98 F (36.7 C)   SpO2: 99%   Weight: 142 lb 11.2 oz (64.7 kg)   PainSc:  0-No pain    Body mass index is 26.1 kg/m.   ECOG PERFORMANCE STATUS: 1 - Symptomatic but completely ambulatory  PHYSICAL EXAM GENERAL:alert, no distress and comfortable SKIN: no rash  EYES: sclera clear NECK: without mass LYMPH:  no palpable cervical or supraclavicular lymphadenopathy  LUNGS: clear with normal breathing effort HEART: regular rate & rhythm, no lower extremity edema ABDOMEN: abdomen soft, non-tender and normal bowel sounds NEURO: alert & oriented x 3 with fluent speech, no focal motor/sensory deficits Breast exam: deferred  CBC    Latest Ref Rng & Units 03/04/2024   10:19 AM 02/12/2024   10:14 AM 01/15/2024    2:19 PM  CBC  WBC 4.0 - 10.5 K/uL 1.6  4.7  4.0   Hemoglobin 12.0 - 15.0 g/dL 89.7  89.8  88.8   Hematocrit 36.0 - 46.0 % 29.5  29.8  32.1   Platelets 150 - 400 K/uL 113  217  140       CMP     Latest Ref Rng & Units 03/04/2024   10:19 AM 02/12/2024   10:14 AM 01/19/2024   10:36 AM  CMP  Glucose 70 - 99 mg/dL 885  889  893   BUN 8 - 23 mg/dL 12  13  12    Creatinine 0.44 - 1.00 mg/dL 8.89  8.99  9.03   Sodium 135 - 145 mmol/L 141  139  141   Potassium 3.5 - 5.1 mmol/L 3.4  3.4  4.0   Chloride 98 - 111 mmol/L 108  103  103   CO2 22 - 32 mmol/L 23  27  21    Calcium  8.9 - 10.3 mg/dL 9.3  9.3  89.8   Total Protein 6.5 - 8.1 g/dL 6.4  6.6    Total Bilirubin 0.0 - 1.2 mg/dL 0.9  1.0    Alkaline Phos 38 - 126 U/L 80  101    AST 15 - 41 U/L 22  25    ALT 0 - 44 U/L 15  18        ASSESSMENT & PLAN: 78 year old female    Malignant neoplasm of overlapping sites of left breast in female, estrogen receptor positive, Stage IA, pT2, cN0, ER+/PR+/HER2-, Grade 2, RS 21, malignant pleural effusion in 12/2023 -she was under short-term f/u for left breast asymmetry.  Biopsy 08/11/21 confirmed invasive mammary carcinoma. -left lumpectomy on 09/09/21 by Dr. 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 No nodes were removed due to her age. -she received adjuvant RT,  completed in 11/2021 -she started adjuvant tamoxifen  in 12/2021, but could not tolerate. She subsequently tried anastrozole  and still could not tolerate. She stopped in 04/2022 -Screening mammogram in May 2024 was negative. - She developed pleural effusion in April 2025, cytology unfortunately showed malignant cells, consistent with breast primary. ERand PR moderately positive, Her2 negative (IHC 0) -FDG PET 01/08/2024 was negative for hypermetabolic lesion or hypermetabolism around the pleura.  [place]  PET scan 01/26/2024 showed diffuse bone mets and mediastinal adenopathy. Pleura was negative on PET -her skin (scalp) biopsy also showed metastatic breast cancer, ER and PR positive, HER2 negative.   -She has started anastrozole  in 12/2023  - Due to her recurrent pleural effusion, and high disease burden, low-dose Ibrance  for better disease control started 02/12/24 (75 mg daily 3 weeks on/1 week off) - Continue Zometa , will change to every 3 months due to her diffuse bone metastasis - due today -Madison Clements appears stable, tolerating Anastrozole  and low-dose Ibrance  well. Her cough has improved.  -We reviewed the results of thyroid  bx which are nondiagnostic/inconclusive. It appers that additional tests on the sample are pending. Will f/up. Discussed potential repeat biopsy in 4-6 months  -Labs reviewed, she has developed moderate neutropenia, reviewed precautions. Will change to 75 mg daily for 2 weeks on/1 week off starting now -F/up and B12 injection in 3 weeks, or sooner if needed.    PLAN: -Labs reviewed -Stop Ibrance  today (completed 2 weeks), off 1 week -Continue Anastrozole , restart cycle 2 Ibrance  6/23 -Zometa  today (q3 months) -Lab, f/up, and B12 injection in 3 weeks -Reviewed plan with Dr. Lanny; pt understands neutropenic precautions   Orders Placed This Encounter  Procedures   US  THORACENTESIS ASP PLEURAL SPACE W/IMG GUIDE    Standing Status:   Standing    Number of Occurrences:   20     Next Expected Occurrence:   03/08/2024    Expiration Date:   03/04/2025    Are labs required for specimen collection?:   No    Reason for Exam (SYMPTOM  OR DIAGNOSIS REQUIRED):   therapeutic, PRN    Preferred imaging location?:   [facility]      All questions were answered. The patient knows to call the clinic with any problems, questions or concerns. No barriers to learning were detected. I spent 20 minutes counseling the patient face to face. The total time spent in the appointment was 30 minutes and more than 50% was on counseling, review of test results, and coordination of care.   Madison Mcquire K Nollie Shiflett, NP 03/04/2024

## 2024-03-05 ENCOUNTER — Other Ambulatory Visit: Payer: Self-pay

## 2024-03-05 NOTE — Progress Notes (Signed)
 Patient's dose was reduce to 1 every day 14 days on, 7 days off, and former dose was discontinued. Spoke with patient, and she would like to receive full 21 doses, which will give her two cycles. Updated refill recurrence to 21 days. Shipping as scheduled 7/2 for delivery 7/3. Next cycle begins 7/7.

## 2024-03-06 ENCOUNTER — Other Ambulatory Visit: Payer: Self-pay

## 2024-03-06 ENCOUNTER — Ambulatory Visit (HOSPITAL_COMMUNITY)

## 2024-03-08 ENCOUNTER — Other Ambulatory Visit: Payer: Self-pay | Admitting: Nurse Practitioner

## 2024-03-08 ENCOUNTER — Encounter: Payer: Self-pay | Admitting: Unknown Physician Specialty

## 2024-03-08 ENCOUNTER — Encounter (HOSPITAL_COMMUNITY): Payer: Self-pay

## 2024-03-08 DIAGNOSIS — C50812 Malignant neoplasm of overlapping sites of left female breast: Secondary | ICD-10-CM

## 2024-03-11 ENCOUNTER — Ambulatory Visit

## 2024-03-13 ENCOUNTER — Other Ambulatory Visit: Payer: Self-pay

## 2024-03-13 NOTE — Progress Notes (Signed)
 Patient called c/o of bilateral knee pain x yesterday. Patient reports she re-started IBRANCE  on Monday. Patient states the pain feels like it's in the knee caps. Patient reports mild swelling, states she is currently icing the knees and taking Tylenol  for relief.  Please advise.

## 2024-03-18 ENCOUNTER — Ambulatory Visit

## 2024-03-18 ENCOUNTER — Other Ambulatory Visit

## 2024-03-21 ENCOUNTER — Ambulatory Visit (HOSPITAL_COMMUNITY)
Admission: RE | Admit: 2024-03-21 | Discharge: 2024-03-21 | Disposition: A | Discharge status: Home / Self Care | Source: Ambulatory Visit | Attending: Nurse Practitioner | Admitting: Nurse Practitioner

## 2024-03-21 ENCOUNTER — Encounter (HOSPITAL_COMMUNITY): Payer: Self-pay | Admitting: Student

## 2024-03-21 ENCOUNTER — Ambulatory Visit (HOSPITAL_COMMUNITY)
Admission: RE | Admit: 2024-03-21 | Discharge: 2024-03-21 | Disposition: A | Discharge status: Home / Self Care | Source: Ambulatory Visit | Attending: Radiology | Admitting: Radiology

## 2024-03-21 VITALS — BP 143/68

## 2024-03-21 DIAGNOSIS — Z170 Estrogen receptor positive status [ER+]: Secondary | ICD-10-CM | POA: Diagnosis not present

## 2024-03-21 DIAGNOSIS — C50812 Malignant neoplasm of overlapping sites of left female breast: Secondary | ICD-10-CM | POA: Diagnosis not present

## 2024-03-21 DIAGNOSIS — J90 Pleural effusion, not elsewhere classified: Secondary | ICD-10-CM | POA: Insufficient documentation

## 2024-03-21 DIAGNOSIS — R918 Other nonspecific abnormal finding of lung field: Secondary | ICD-10-CM | POA: Diagnosis not present

## 2024-03-21 DIAGNOSIS — Z9889 Other specified postprocedural states: Secondary | ICD-10-CM

## 2024-03-21 MED ORDER — LIDOCAINE HCL 1 % IJ SOLN
INTRAMUSCULAR | Status: AC
Start: 2024-03-21 — End: 2024-03-21
  Filled 2024-03-21: qty 20

## 2024-03-21 NOTE — Procedures (Signed)
 PROCEDURE SUMMARY:  Successful US  guided left thoracentesis. Yielded 550 mL of yellow fluid. Pt tolerated procedure well. No immediate complications.  Specimen was not sent for labs. CXR ordered.  EBL < 5 mL  Solmon Selmer Ku PA-C 03/21/2024 11:41 AM

## 2024-03-24 NOTE — Assessment & Plan Note (Signed)
 Stage IA, pT2, cN0, ER+/PR+/HER2-, Grade 2, RS 21, malignant pleural effusion in 12/2023 -she was under short-term f/u for left breast asymmetry. Biopsy 08/11/21 confirmed invasive mammary carcinoma. -left lumpectomy on 09/09/21 by Dr. 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 No nodes were removed due to her age. -she received adjuvant RT, completed in 11/2021 -she started adjuvant tamoxifen  in 12/2021, but could not tolerate. She subsequently tried anastrozole  and still can not tolerate. She stopped in 04/2022 -Screening mammogram in May 2024 were negative. - She developed pleural effusion in April 2025, cytology unfortunately showed malignant cells, consistent with breast primary. ERand PR moderately positive, Her2 negative (IHC 0) -FDG PET 01/08/2024 was negative for hypermetabolic lesion or hypermetabolism around the pleura.  [place]  PET scan 01/26/2024 showed diffuse bone mets and mediastinal adenopathy. Pleura was negative on PET -her skin (scalp) biopsy also showed metastatic breast cancer, ER and PR positive, HER2 negative.   -She has started anastrozole  in 12/2023  - Due to her recurrent pleural effusion, and high disease burden, I will add a low-dose Ibrance for better disease control - Continue Zometa , will change to every 3 months due to her diffuse bone metastasis.

## 2024-03-25 ENCOUNTER — Inpatient Hospital Stay (HOSPITAL_BASED_OUTPATIENT_CLINIC_OR_DEPARTMENT_OTHER): Admitting: Hematology

## 2024-03-25 ENCOUNTER — Inpatient Hospital Stay: Attending: Hematology

## 2024-03-25 ENCOUNTER — Inpatient Hospital Stay

## 2024-03-25 VITALS — BP 136/60 | HR 90 | Temp 97.7°F | Resp 17 | Ht 62.0 in | Wt 145.3 lb

## 2024-03-25 DIAGNOSIS — M25562 Pain in left knee: Secondary | ICD-10-CM | POA: Diagnosis not present

## 2024-03-25 DIAGNOSIS — Z66 Do not resuscitate: Secondary | ICD-10-CM | POA: Insufficient documentation

## 2024-03-25 DIAGNOSIS — M25561 Pain in right knee: Secondary | ICD-10-CM | POA: Diagnosis not present

## 2024-03-25 DIAGNOSIS — Z170 Estrogen receptor positive status [ER+]: Secondary | ICD-10-CM | POA: Diagnosis not present

## 2024-03-25 DIAGNOSIS — Z1732 Human epidermal growth factor receptor 2 negative status: Secondary | ICD-10-CM | POA: Insufficient documentation

## 2024-03-25 DIAGNOSIS — M25521 Pain in right elbow: Secondary | ICD-10-CM | POA: Diagnosis not present

## 2024-03-25 DIAGNOSIS — J90 Pleural effusion, not elsewhere classified: Secondary | ICD-10-CM | POA: Diagnosis not present

## 2024-03-25 DIAGNOSIS — Z1721 Progesterone receptor positive status: Secondary | ICD-10-CM | POA: Diagnosis not present

## 2024-03-25 DIAGNOSIS — C7951 Secondary malignant neoplasm of bone: Secondary | ICD-10-CM | POA: Insufficient documentation

## 2024-03-25 DIAGNOSIS — Z79811 Long term (current) use of aromatase inhibitors: Secondary | ICD-10-CM | POA: Diagnosis not present

## 2024-03-25 DIAGNOSIS — M8580 Other specified disorders of bone density and structure, unspecified site: Secondary | ICD-10-CM

## 2024-03-25 DIAGNOSIS — C50812 Malignant neoplasm of overlapping sites of left female breast: Secondary | ICD-10-CM | POA: Diagnosis not present

## 2024-03-25 DIAGNOSIS — M25511 Pain in right shoulder: Secondary | ICD-10-CM | POA: Diagnosis not present

## 2024-03-25 DIAGNOSIS — D519 Vitamin B12 deficiency anemia, unspecified: Secondary | ICD-10-CM | POA: Diagnosis not present

## 2024-03-25 LAB — CBC WITH DIFFERENTIAL/PLATELET
Abs Immature Granulocytes: 0.01 K/uL (ref 0.00–0.07)
Basophils Absolute: 0 K/uL (ref 0.0–0.1)
Basophils Relative: 1 %
Eosinophils Absolute: 0 K/uL (ref 0.0–0.5)
Eosinophils Relative: 1 %
HCT: 30.7 % — ABNORMAL LOW (ref 36.0–46.0)
Hemoglobin: 10.5 g/dL — ABNORMAL LOW (ref 12.0–15.0)
Immature Granulocytes: 0 %
Lymphocytes Relative: 29 %
Lymphs Abs: 0.9 K/uL (ref 0.7–4.0)
MCH: 34.2 pg — ABNORMAL HIGH (ref 26.0–34.0)
MCHC: 34.2 g/dL (ref 30.0–36.0)
MCV: 100 fL (ref 80.0–100.0)
Monocytes Absolute: 0.5 K/uL (ref 0.1–1.0)
Monocytes Relative: 15 %
Neutro Abs: 1.6 K/uL — ABNORMAL LOW (ref 1.7–7.7)
Neutrophils Relative %: 54 %
Platelets: 214 K/uL (ref 150–400)
RBC: 3.07 MIL/uL — ABNORMAL LOW (ref 3.87–5.11)
RDW: 17.7 % — ABNORMAL HIGH (ref 11.5–15.5)
WBC: 3 K/uL — ABNORMAL LOW (ref 4.0–10.5)
nRBC: 1.7 % — ABNORMAL HIGH (ref 0.0–0.2)

## 2024-03-25 LAB — COMPREHENSIVE METABOLIC PANEL WITH GFR
ALT: 19 U/L (ref 0–44)
AST: 25 U/L (ref 15–41)
Albumin: 4.1 g/dL (ref 3.5–5.0)
Alkaline Phosphatase: 84 U/L (ref 38–126)
Anion gap: 8 (ref 5–15)
BUN: 17 mg/dL (ref 8–23)
CO2: 24 mmol/L (ref 22–32)
Calcium: 9.5 mg/dL (ref 8.9–10.3)
Chloride: 106 mmol/L (ref 98–111)
Creatinine, Ser: 1.22 mg/dL — ABNORMAL HIGH (ref 0.44–1.00)
GFR, Estimated: 45 mL/min — ABNORMAL LOW (ref >60)
Glucose, Bld: 116 mg/dL — ABNORMAL HIGH (ref 70–99)
Potassium: 3.4 mmol/L — ABNORMAL LOW (ref 3.5–5.1)
Sodium: 138 mmol/L (ref 135–145)
Total Bilirubin: 1 mg/dL (ref 0.0–1.2)
Total Protein: 6.3 g/dL — ABNORMAL LOW (ref 6.5–8.1)

## 2024-03-25 MED ORDER — CYANOCOBALAMIN 1000 MCG/ML IJ SOLN
1000.0000 ug | Freq: Once | INTRAMUSCULAR | Status: AC
  Administered 2024-03-25: 1000 ug via INTRAMUSCULAR
  Filled 2024-03-25: qty 1

## 2024-03-25 NOTE — Progress Notes (Signed)
 [facility]   Telephone:(336) [phone] Fax:([phone]) [phone]   Clinic Follow up Note   Patient Care Team: [name], MD as PCP - General (Family Medicine) [name], MD as PCP - Cardiology (Cardiology) [name], MD as Consulting Physician (Cardiology) [name], MD as Consulting Physician (Ophthalmology) [name], PA-C as Physician Assistant (Dermatology) [name], MD as Consulting Physician (General Surgery) [name], MD as Consulting Physician (Hematology) [name], MD as Attending Physician (Radiation Oncology) [name], [facility] (Inactive) (Pharmacist)  Date of Service:  03/25/2024  CHIEF COMPLAINT: f/u of metastatic breast cancer  CURRENT THERAPY:  Supportive care  Oncology History   Malignant neoplasm of overlapping sites of left breast in female, estrogen receptor positive (HCC) Stage IA, pT2, cN0, ER+/PR+/HER2-, Grade 2, RS 21, 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 Biopsy 08/11/21 confirmed invasive mammary carcinoma. -left lumpectomy on 09/09/21 by Dr. 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 No nodes were removed due to her age. -she received adjuvant RT, completed in 11/2021 -she started adjuvant tamoxifen  in 12/2021, but could not tolerate. She subsequently tried anastrozole  and still can not tolerate. She stopped in 04/2022 -Screening mammogram in May 2024 were negative. - She developed pleural effusion in April 2025, cytology unfortunately showed malignant cells, consistent with breast primary. ERand PR moderately positive, Her2 negative (IHC 0) -FDG PET 01/08/2024 was negative for hypermetabolic lesion or hypermetabolism around the pleura.  [place]  PET scan 01/26/2024 showed diffuse bone mets and mediastinal adenopathy. Pleura was negative on PET -her skin (scalp) biopsy also showed metastatic breast cancer, ER  and PR positive, HER2 negative.   -She has started anastrozole  in 12/2023  - Due to her recurrent pleural effusion, and high disease burden, I will add a low-dose Ibrance  for better disease control - Continue Zometa , will change to every 3 months due to her diffuse bone metastasis. - Due to severe joint pain especially in her knees, she stopped her anastrozole  and Ibrance  in early July.  Assessment & Plan Metastatic breast cancer Metastatic breast cancer with bone metastasis and pleural effusion. Significant joint pain potentially related to anastrozole . She has stopped Ibrance  and anastrozole . Thoracentesis provides symptomatic relief. Prefers quality of life over treatment side effects and has opted to pause treatment. - Pause Ibrance  and anastrozole  for a couple of months. - Continue Zemaira infusions. - Encourage calcium  and vitamin D  supplementation. - Advise on fall precautions due to high fracture risk. - Consider tamoxifen  if joint pain improves and she is willing to restart treatment. - Schedule repeat PET scan in six months to monitor disease progression. - Encourage thoracentesis as needed for symptomatic relief from pleural effusion.  Joint pain due to anastrozole  Joint pain affecting shoulders and elbows, likely related to anastrozole . Considering tamoxifen , which has a lower incidence of joint pain. - Consider tamoxifen  if joint pain improves and she is willing to restart treatment. - Prescribe oral steroid (Medrol  pack) to alleviate joint pain.  Knee pain Bilateral knee pain, more pronounced during walking and associated with instability. Right knee osteoarthritis and was scheduled for knee replacement. Pain may be exacerbated by anastrozole . - Refer to orthopedic specialist for evaluation and possible injection therapy. - Prescribe oral steroid (Medrol  pack) to alleviate joint pain.  Mild anemia Mild anemia with hemoglobin at 10.5 g/dL, improved from previous levels. White  blood cell count is 3.0, improved since stopping Ibrance .  Platelet count is normal. - Monitor blood counts. - Advise to report any symptoms of fatigue or exhaustion for further evaluation.  Thyroid  biopsy with atypical cells Thyroid  biopsy showed atypical cells with a 50% risk of malignancy according to Afirma testing. Thyroid  function was normal in April. - No immediate action required regarding thyroid  biopsy. - Advise to monitor for symptoms of thyroid  dysfunction such as heat or cold intolerance.  Goals of Care Prefers quality of life over aggressive treatment, opting for a limited DNR and no life support measures. Completed an advance care plan and designated a healthcare power of attorney. - Document limited DNR and advance care plan in the medical record. - Ensure healthcare power of attorney information is up to date.   Plan - Due to significant joint pain, she has stopped Ibrance  and anastrozole  a few weeks ago, will continue holding dose of medicine for now. - Will repeat thoracentesis as needed for symptom relief, knows to call radiology to schedule - Lab and follow-up in 2 months, will discuss starting tamoxifen  on next visit. - Patient agrees with DNR, I documented. - Continue Zometa  every 3 months, next due in 2 months.   SUMMARY OF ONCOLOGIC HISTORY: Oncology History Overview Note   Cancer Staging  Malignant neoplasm of overlapping sites of left breast in female, estrogen receptor positive (HCC) Staging form: Breast, AJCC 8th Edition - Clinical stage from 08/11/2021: Stage IA (cT1c, cN0, cM0, G2, ER+, PR+, HER2-) - Signed by [name], MD on 08/18/2021     Malignant neoplasm of overlapping sites of left breast in female, estrogen receptor positive (HCC)  07/27/2021 Mammogram   EXAM: DIGITAL DIAGNOSTIC UNILATERAL LEFT MAMMOGRAM WITH TOMOSYNTHESIS AND CAD; ULTRASOUND LEFT BREAST LIMITED  IMPRESSION: 1. There is a suspicious mass in the left breast at 9  o'clock measuring 1.8 cm.   2. There is a 1.0 cm group of suspicious linear calcifications anterior to the suspicious mass in the left breast, as well as a faint 2 mm group which lie 1 cm lateral to the linear calcifications.   3.  No evidence of left axillary lymphadenopathy   08/11/2021 Cancer Staging   Staging form: Breast, AJCC 8th Edition - Clinical stage from 08/11/2021: Stage IA (cT1c, cN0, cM0, G2, ER+, PR+, HER2-) - Signed by [name], MD on 08/18/2021 Stage prefix: Initial diagnosis Histologic grading system: 3 grade system   08/11/2021 Initial Biopsy   Diagnosis 1. Breast, left, needle core biopsy, 9 o'clock, ribbon clip - INVASIVE MAMMARY CARCINOMA - SEE COMMENT 2. Breast, left, needle core biopsy, lower inner quadrant, x clip - INVASIVE MAMMARY CARCINOMA - MAMMARY CARCINOMA IN-SITU - CALCIFICATIONS - SEE COMMENT Microscopic Comment 1. The biopsy material shows an infiltrative proliferation of cells with arranged linearly and in small clusters. Based on the biopsy, the carcinoma appears Nottingham grade 2 of 3 and measures 1.2 cm in greatest linear extent. 2. Based on the biopsy, the carcinoma appears Nottingham grade 2 of 3 and measures 0.2 cm in greatest linear extent.  1. PROGNOSTIC INDICATORS Results: The tumor cells are NEGATIVE for Her2 (1+). Estrogen Receptor: 100%, POSITIVE, STRONG STAINING INTENSITY Progesterone Receptor: 60%, POSITIVE, STRONG STAINING INTENSITY Proliferation Marker Ki67: 10%  2. PROGNOSTIC INDICATORS Results: The tumor cells are NEGATIVE for Her2 (1+). Estrogen Receptor: 100%, POSITIVE, STRONG STAINING INTENSITY Progesterone Receptor: 60%, POSITIVE, STRONG STAINING INTENSITY Proliferation Marker Ki67: 15%   08/13/2021 Mammogram   EXAM: DIGITAL DIAGNOSTIC UNILATERAL RIGHT MAMMOGRAM WITH TOMOSYNTHESIS AND CAD  IMPRESSION: No mammographic evidence  for malignancy.   08/16/2021 Initial Diagnosis   Malignant neoplasm of overlapping  sites of left breast in female, estrogen receptor positive (HCC)   08/25/2021 Imaging   EXAM: BILATERAL BREAST MRI WITH AND WITHOUT CONTRAST  IMPRESSION: 1. Biopsy-proven invasive ductal carcinoma measuring approximately 2.8 x 1.7 x 1.5 cm in the inner breast at middle depth, associated with the ribbon shaped tissue marking clip placed at the time of core needle biopsy. Enhancement extends approximately 1.4 cm posterior to the clip. 2. Approximate 1.8 cm post biopsy hematoma with associated rim enhancement at the site of the biopsy-proven invasive ductal carcinoma and DCIS in the LOWER INNER QUADRANT at anterior depth associated with the X shaped tissue marking clip. 3. In combination, the overall enhancement spans approximately 4 cm. 4. No MRI evidence of malignancy involving the RIGHT breast. 5. No pathologic lymphadenopathy.   08/27/2021 Genetic Testing   egative hereditary cancer genetic testing: no pathogenic variants detected in [facility] BRCAPlus Panel and CancerNext-Expanded +RNAinsight Panel.  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  The report dates are 08/27/2021 and 08/30/2021.   The BRCAplus panel offered by [employer] and includes sequencing and deletion/duplication analysis for the following 8 genes: ATM, BRCA1, BRCA2, CDH1, CHEK2, PALB2, PTEN, and TP53.  The CancerNext-Expanded gene panel offered by [facility] and includes sequencing, rearrangement, and RNA analysis for the following 77 genes: AIP, ALK, APC, ATM, AXIN2, BAP1, BARD1, BLM, BMPR1A, BRCA1, BRCA2, BRIP1, CDC73, CDH1, CDK4, CDKN1B, CDKN2A, CHEK2, CTNNA1, DICER1, FANCC, FH, FLCN, GALNT12, KIF1B, LZTR1, MAX, MEN1, MET, MLH1, MSH2, MSH3, MSH6, MUTYH, NBN, NF1, NF2, NTHL1, PALB2, PHOX2B, PMS2, POT1, PRKAR1A, PTCH1, PTEN, RAD51C, RAD51D, RB1, RECQL, RET, SDHA, SDHAF2, SDHB, SDHC, SDHD, SMAD4, SMARCA4, SMARCB1, SMARCE1, STK11, SUFU, TMEM127, TP53, TSC1, TSC2, VHL and XRCC2 (sequencing and  deletion/duplication); EGFR, EGLN1, HOXB13, KIT, MITF, PDGFRA, POLD1, and POLE (sequencing only); EPCAM and GREM1 (deletion/duplication only).    09/09/2021 Definitive Surgery   FINAL MICROSCOPIC DIAGNOSIS:   A. BREAST, LEFT, LUMPECTOMY:  -  Invasive ductal carcinoma, Nottingham grade 2 of 3, 3.5 cm  -  Ductal carcinoma in-situ, intermediate grade  -  Margins uninvolved by carcinoma (<0.1 cm; anterior)  -  Previous biopsy site changes present    09/09/2021 Oncotype testing   Oncotype DX was obtained on the final surgical sample and the recurrence score of 21 predicts a risk of recurrence outside the breast over the next 9 years of 7%, if the patient's only systemic therapy is an antiestrogen for 5 years.  It also predicts no benefit from chemotherapy.   10/20/2021 - 11/16/2021 Radiation Therapy   Site Technique Total Dose (Gy) Dose per Fx (Gy) Completed Fx Beam Energies  Breast, Left: Breast_L_axilla 3D 40.05/40.05 2.67 15/15 10X  Breast, Left: Breast_L_Bst 3D 10/10 2 5/5 6X, 10X     12/06/2021 - 04/2022 Anti-estrogen oral therapy   Tamoxifen  x 5 years unable to tolerate, attempted anastrozole  and had similar side effects.  Opted to forego adjuvant antiestrogen therapy      Discussed the use of AI scribe software for clinical note transcription with the patient, who gave verbal consent to proceed.  History of Present Illness Madison Clements is an 78 year old female with metastatic breast cancer who presents for follow-up.  She experiences significant bilateral knee pain, particularly during the lifting part of walking, with immediate pain upon standing and worsening with walking. This pain is different from her previous right knee pain and is  located in the front of both knees. There is also joint pain in her shoulders and elbows, which is activity-related and intermittent. Stiffness and discomfort occur in her pelvis and buttocks, especially at night, improving upon getting  up.  She has metastatic breast cancer with bone metastasis, as indicated by a PET scan in May showing involvement in the cervical spine, thoracic spine, shoulders, sternum, and pelvic area. Pelvic pain is most prominent at night. She receives Zometa  infusions for bone strengthening, with the last infusion on June 23rd.  She has mild anemia with a hemoglobin level of 10.5 and an improved white blood cell count of 3.0 since stopping Ibrance . Her platelet count is normal.     All other systems were reviewed with the patient and are negative.  MEDICAL HISTORY:  Past Medical History:  Diagnosis Date   Allergy 1990   Morphine following surgery   Arthritis    generalized   Atypical mole 02/24/2015   RGHT LATERAL THIGH MODERATE   Atypical mole 01/20/2022   Left Breast (mild)   Atypical mole 01/20/2022   Mid Back (mild)   Atypical nevi 02/24/2015   RIGHT NECK MILD   Atypical nevi 08/25/2015   LEFT UPPER ARM MILD   Atypical nevi 08/25/2015   LEFT LATERAL FOREARM MILD/FREE   Atypical nevi 02/23/2016   LEFT THIGH MODERATE/FREE   Atypical nevi 02/23/2016   LEFT MEDIAL LEG MODERATE/FREE   Atypical nevi 02/23/2016   LEFT UPPER BACK MILD /FREE   Atypical nevi 08/23/2016   RIGHT ANT PROX THIGH MILD /FREE   Atypical nevi 07/25/2018   MID LOWER BACK MODERATE   Atypical nevi 01/22/2019   LEFT MID BACK MILD /FREE   Atypical nevi 01/22/2019   LEFT NECK MILD   Atypical nevi 08/06/2019   LEFT INNER BREAST MODERATE W/S   Atypical nevi 08/06/2019   LEFT OUTER SIDE MILD/FREE   Blood transfusion without reported diagnosis    hx of   Breast cancer (HCC) 2022   CAD (coronary artery disease), native coronary artery 02/07/2019   Minimal CAD at the time of cath in 2016 in [place]    Family history of breast cancer 08/19/2021   Family history of pancreatic cancer 08/19/2021   Fatty liver 05/21/2020   Gastric polyps 10/02/2018   EGD 03/2015; benign   GERD (gastroesophageal reflux  disease) 10/02/2018   on meds   History of melanoma 10/02/2018   Abdomen 2012; 2013; s/p local excisions.    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 DISTAL LOWER LEG TREATED BY DR. GEORGENE   Sleep apnea    Sleep apnea 05/26/2020   uses CPAP   Tricuspid regurgitation    moderate by echo 04/2023    SURGICAL HISTORY: Past Surgical History:  Procedure Laterality Date   ABDOMINAL HYSTERECTOMY  1990   APPENDECTOMY  1962   BLADDER SUSPENSION  1990   BREAST LUMPECTOMY Left 08/2021   BREAST LUMPECTOMY WITH RADIOACTIVE SEED LOCALIZATION Left 09/09/2021   Procedure: LEFT BREAST LUMPECTOMY WITH RADIOACTIVE SEED LOCALIZATION X2;  Surgeon:  [name], MD;  Location: [facility];  Service: General;  Laterality: Left;   COLONOSCOPY  2016   in [place]-hx of polyps   EYE SURGERY  2013 torn retina laser   IR THORACENTESIS ASP PLEURAL SPACE W/IMG GUIDE  02/07/2024   MENISCUS REPAIR Right 2001   right knee   RIGHT HEART CATH AND CORONARY ANGIOGRAPHY N/A 06/20/2023   Procedure: RIGHT HEART CATH AND CORONARY ANGIOGRAPHY;  Surgeon: Rolan Ezra RAMAN, MD;  Location: [facility] INVASIVE CV LAB;  Service: Cardiovascular;  Laterality: N/A;   thumb surgery Left    TONSILLECTOMY AND ADENOIDECTOMY     TRANSESOPHAGEAL ECHOCARDIOGRAM (CATH LAB) N/A 01/04/2024   Procedure: TRANSESOPHAGEAL ECHOCARDIOGRAM;  Surgeon: Santo Stanly LABOR, MD;  Location: [facility] INVASIVE CV LAB;  Service: Cardiovascular;  Laterality: N/A;   tuabl ligation     TUBAL LIGATION  1975   WISDOM TOOTH EXTRACTION      I have reviewed the social  history and family history with the patient and they are unchanged from previous note.  ALLERGIES:  is allergic to morphine, metoprolol , nebivolol , and morphine and codeine.  MEDICATIONS:  Current Outpatient Medications  Medication Sig Dispense Refill   anastrozole  (ARIMIDEX ) 1 MG tablet Take 1 tablet (1 mg total) by mouth daily. 90 tablet 1   diltiazem  (CARDIZEM  CD) 300 MG 24 hr capsule Take 1 capsule (300 mg total) by mouth daily. 90 capsule 3   ezetimibe  (ZETIA ) 10 MG tablet Take 1 tablet (10 mg total) by mouth daily. 90 tablet 3   omeprazole  (PRILOSEC) 20 MG capsule Take 20 mg by mouth daily.     ondansetron  (ZOFRAN ) 8 MG tablet Take 1 tablet (8 mg total) by mouth every 8 (eight) hours as needed for nausea or vomiting. 20 tablet 2   palbociclib  (IBRANCE ) 75 MG tablet Take 1 tablet (75 mg total) by mouth daily. Take for 14 days on, 7 days off, repeat every 21 days. 21 tablet 0   Prenatal Vit-Fe Fumarate-FA (PRENATAL VITAMIN PO) Take by mouth daily. Prenatal w/folic acid po daily     Probiotic Product (ADVANCED PROBIOTIC PO) Take 1 capsule by mouth daily.     rosuvastatin  (CRESTOR ) 20 MG tablet Take 1 tablet (20 mg total) by mouth daily. 90 tablet 3   No current facility-administered medications for this visit.    PHYSICAL EXAMINATION: ECOG PERFORMANCE STATUS: 2 - Symptomatic, <50% confined to bed  Vitals:   03/25/24 1326  BP: 136/60  Pulse: 90  Resp: 17  Temp: 97.7 F (36.5 C)  SpO2: 97%   Wt Readings from Last 3 Encounters:  03/25/24 65.9 kg (145 lb 4.8 oz)  03/04/24 64.7 kg (142 lb 11.2 oz)  02/20/24 65 kg (143 lb 6.4 oz)     GENERAL:alert, no distress and comfortable SKIN: skin color, texture, turgor are normal, no rashes or significant lesions EYES: normal, Conjunctiva are pink and non-injected, sclera clear NECK: supple, thyroid  normal size, non-tender, without nodularity LYMPH:  no palpable lymphadenopathy in the cervical, axillary  LUNGS: clear to auscultation and  percussion with normal breathing effort with slightly reduced breath sound on left lung base HEART: regular rate & rhythm and no murmurs and no lower extremity edema ABDOMEN:abdomen soft, non-tender and normal bowel sounds Musculoskeletal:no cyanosis of digits and no clubbing  NEURO: alert & oriented x 3 with fluent speech, no focal motor/sensory deficits  Physical Exam    LABORATORY DATA:  I have reviewed the data as listed    Latest Ref  Rng & Units 03/25/2024    1:09 PM 03/04/2024   10:19 AM 02/12/2024   10:14 AM  CBC  WBC 4.0 - 10.5 K/uL 3.0  1.6  4.7   Hemoglobin 12.0 - 15.0 g/dL 89.4  89.7  89.8   Hematocrit 36.0 - 46.0 % 30.7  29.5  29.8   Platelets 150 - 400 K/uL 214  113  217         Latest Ref Rng & Units 03/25/2024    1:09 PM 03/04/2024   10:19 AM 02/12/2024   10:14 AM  CMP  Glucose 70 - 99 mg/dL 883  885  889   BUN 8 - 23 mg/dL 17  12  13    Creatinine 0.44 - 1.00 mg/dL 8.77  8.89  8.99   Sodium 135 - 145 mmol/L 138  141  139   Potassium 3.5 - 5.1 mmol/L 3.4  3.4  3.4   Chloride 98 - 111 mmol/L 106  108  103   CO2 22 - 32 mmol/L 24  23  27    Calcium  8.9 - 10.3 mg/dL 9.5  9.3  9.3   Total Protein 6.5 - 8.1 g/dL 6.3  6.4  6.6   Total Bilirubin 0.0 - 1.2 mg/dL 1.0  0.9  1.0   Alkaline Phos 38 - 126 U/L 84  80  101   AST 15 - 41 U/L 25  22  25    ALT 0 - 44 U/L 19  15  18        RADIOGRAPHIC STUDIES: I have personally reviewed the radiological images as listed and agreed with the findings in the report. No results found.    Orders Placed This Encounter  Procedures   Do not attempt resuscitation (DNR)    If patient has no pulse and is not breathing:   Do Not Attempt Resuscitation    If patient has a pulse and/or is breathing: Medical Treatment Goals:   LIMITED ADDITIONAL INTERVENTIONS: Use medication/IV fluids and cardiac monitoring as indicated; Do not use intubation or mechanical ventilation (DNI), also provide comfort medications.  Transfer to  Progressive/Stepdown as indicated, avoid Intensive Care.    Consent::   Discussion documented in EHR or advanced directives reviewed   All questions were answered. The patient knows to call the clinic with any problems, questions or concerns. No barriers to learning was detected. The total time spent in the appointment was 40 minutes, including review of chart and various tests results, discussions about plan of care and coordination of care plan     Onita Mattock, MD 03/25/2024

## 2024-03-26 MED ORDER — METHYLPREDNISOLONE 4 MG PO TBPK: ORAL_TABLET | ORAL | 0 refills | Status: DC

## 2024-03-26 NOTE — Addendum Note (Signed)
 Addended by: [name] on: 03/26/2024 07:49 AM   Modules accepted: Orders

## 2024-04-03 ENCOUNTER — Other Ambulatory Visit: Payer: Self-pay | Admitting: Pharmacy Technician

## 2024-04-03 ENCOUNTER — Other Ambulatory Visit: Payer: Self-pay

## 2024-04-03 DIAGNOSIS — J90 Pleural effusion, not elsewhere classified: Secondary | ICD-10-CM

## 2024-04-03 DIAGNOSIS — Z170 Estrogen receptor positive status [ER+]: Secondary | ICD-10-CM

## 2024-04-03 NOTE — Progress Notes (Addendum)
 7.23 Paused program per chart on 7/14 by MD. Patient called in to report that her and MD decided to hold off on Ibrance  until future appointment on 05/27/24.  Per chart and patient reported severe joint pain and prefers quality of life over treatment side effects and has opted to pause treatment.  Confirm with [facility].

## 2024-04-04 ENCOUNTER — Encounter: Payer: Self-pay | Admitting: Hematology

## 2024-04-08 ENCOUNTER — Ambulatory Visit

## 2024-04-10 ENCOUNTER — Encounter (HOSPITAL_COMMUNITY): Payer: Self-pay | Admitting: Emergency Medicine

## 2024-04-10 ENCOUNTER — Other Ambulatory Visit: Payer: Self-pay | Admitting: Hematology

## 2024-04-10 ENCOUNTER — Emergency Department (HOSPITAL_COMMUNITY)

## 2024-04-10 ENCOUNTER — Ambulatory Visit (HOSPITAL_COMMUNITY)
Admission: RE | Admit: 2024-04-10 | Discharge: 2024-04-10 | Disposition: A | Discharge status: Home / Self Care | Source: Ambulatory Visit | Attending: Hematology | Admitting: Hematology

## 2024-04-10 ENCOUNTER — Emergency Department (HOSPITAL_COMMUNITY)
Admission: EM | Admit: 2024-04-10 | Discharge: 2024-04-10 | Disposition: A | Discharge status: Home / Self Care | Attending: Emergency Medicine | Admitting: Emergency Medicine

## 2024-04-10 ENCOUNTER — Other Ambulatory Visit: Payer: Self-pay | Admitting: Cardiology

## 2024-04-10 ENCOUNTER — Other Ambulatory Visit: Payer: Self-pay

## 2024-04-10 ENCOUNTER — Telehealth: Payer: Self-pay

## 2024-04-10 DIAGNOSIS — I10 Essential (primary) hypertension: Secondary | ICD-10-CM | POA: Diagnosis not present

## 2024-04-10 DIAGNOSIS — R0781 Pleurodynia: Secondary | ICD-10-CM

## 2024-04-10 DIAGNOSIS — J9811 Atelectasis: Secondary | ICD-10-CM | POA: Diagnosis not present

## 2024-04-10 DIAGNOSIS — J90 Pleural effusion, not elsewhere classified: Secondary | ICD-10-CM | POA: Insufficient documentation

## 2024-04-10 DIAGNOSIS — I700 Atherosclerosis of aorta: Secondary | ICD-10-CM | POA: Diagnosis not present

## 2024-04-10 DIAGNOSIS — Z79899 Other long term (current) drug therapy: Secondary | ICD-10-CM | POA: Diagnosis not present

## 2024-04-10 DIAGNOSIS — C50812 Malignant neoplasm of overlapping sites of left female breast: Secondary | ICD-10-CM | POA: Diagnosis not present

## 2024-04-10 DIAGNOSIS — Z170 Estrogen receptor positive status [ER+]: Secondary | ICD-10-CM | POA: Insufficient documentation

## 2024-04-10 DIAGNOSIS — E042 Nontoxic multinodular goiter: Secondary | ICD-10-CM | POA: Diagnosis not present

## 2024-04-10 DIAGNOSIS — Z853 Personal history of malignant neoplasm of breast: Secondary | ICD-10-CM | POA: Insufficient documentation

## 2024-04-10 DIAGNOSIS — I2510 Atherosclerotic heart disease of native coronary artery without angina pectoris: Secondary | ICD-10-CM | POA: Diagnosis not present

## 2024-04-10 DIAGNOSIS — Z85820 Personal history of malignant melanoma of skin: Secondary | ICD-10-CM | POA: Diagnosis not present

## 2024-04-10 DIAGNOSIS — R0602 Shortness of breath: Secondary | ICD-10-CM | POA: Diagnosis not present

## 2024-04-10 LAB — TROPONIN I (HIGH SENSITIVITY)
Troponin I (High Sensitivity): 4 ng/L (ref <18)
Troponin I (High Sensitivity): 4 ng/L (ref <18)

## 2024-04-10 LAB — BASIC METABOLIC PANEL WITH GFR
Anion gap: 13 (ref 5–15)
BUN: 14 mg/dL (ref 8–23)
CO2: 22 mmol/L (ref 22–32)
Calcium: 9.5 mg/dL (ref 8.9–10.3)
Chloride: 105 mmol/L (ref 98–111)
Creatinine, Ser: 0.86 mg/dL (ref 0.44–1.00)
GFR, Estimated: >60 mL/min (ref >60)
Glucose, Bld: 100 mg/dL — ABNORMAL HIGH (ref 70–99)
Potassium: 3.4 mmol/L — ABNORMAL LOW (ref 3.5–5.1)
Sodium: 140 mmol/L (ref 135–145)

## 2024-04-10 LAB — CBC
HCT: 35.1 % — ABNORMAL LOW (ref 36.0–46.0)
Hemoglobin: 11.6 g/dL — ABNORMAL LOW (ref 12.0–15.0)
MCH: 34 pg (ref 26.0–34.0)
MCHC: 33 g/dL (ref 30.0–36.0)
MCV: 102.9 fL — ABNORMAL HIGH (ref 80.0–100.0)
Platelets: 230 K/uL (ref 150–400)
RBC: 3.41 MIL/uL — ABNORMAL LOW (ref 3.87–5.11)
RDW: 15.3 % (ref 11.5–15.5)
WBC: 6.2 K/uL (ref 4.0–10.5)
nRBC: 0.3 % — ABNORMAL HIGH (ref 0.0–0.2)

## 2024-04-10 LAB — BRAIN NATRIURETIC PEPTIDE: B Natriuretic Peptide: 33 pg/mL (ref 0.0–100.0)

## 2024-04-10 MED ORDER — LIDOCAINE HCL 1 % IJ SOLN
INTRAMUSCULAR | Status: AC
  Filled 2024-04-10: qty 20

## 2024-04-10 MED ORDER — IOHEXOL 350 MG/ML SOLN
75.0000 mL | Freq: Once | INTRAVENOUS | Status: AC | PRN
  Administered 2024-04-10: 75 mL via INTRAVENOUS

## 2024-04-10 NOTE — ED Triage Notes (Addendum)
 Patient c/o SOB x 3 days. Per report Oncology Dr recommended to be seen in ED for further work up. Patient report she's unable to do her thoracentesis d/t not enough fluids on her lungs today. Patient report 8/10 left rib cage pain when taking a deep breath. Patient denies N/V. Patient denies fever at home. Hx Breast Ca with mets

## 2024-04-10 NOTE — Progress Notes (Signed)
Patient presents for therapeutic thoracentesis. US limited chest shows trace amount of pleural fluid noted  Insufficient to perform a safe thoracentesis. Procedure not performed.   

## 2024-04-10 NOTE — Telephone Encounter (Signed)
 Spoke with pt via telephone regarding Dr. Demetra response to pt's previous call.  Pt stated her HR is running around 125 on exertion, dull pain in posterior mid to upper left back, LLE edema, and SOB.  Stated Dr. Lanny would like for the pt to go to the ED for further evaluation since the pt did not need a thoracentesis today but is still symptomatic.  Pt verbalized understanding and had no further questions or concerns.

## 2024-04-10 NOTE — Telephone Encounter (Signed)
 Pt called stating she went today to have a thoracentesis but based on the sonogram she did not have enough fluid buildup to do the thoracentesis today.  Pt c/o tachycardia, SOB, and pain in the left posterior mid to upper back (lung area).  Pt called asking if Dr. Lanny would like for her to do a CXR or f/u with her pulmonologist regarding her symptoms.  Stated this nurse will make Dr. Lanny aware and f/u with pt.

## 2024-04-10 NOTE — ED Provider Triage Note (Signed)
 Emergency Medicine Provider Triage Evaluation Note  Madison Clements , a 78 y.o. female  was evaluated in triage.  Pt complains of shortness of breath. Patient has hx of metastatic breast cancer and is being seen by oncology. Hx of thoracentesis due to fluid surrounding lungs however on most recent visit not enough fluid to take off. Patient persistently short of breath and told to come in by oncology.   Review of Systems  Positive: Shortness of breath, pain worse with deep breathing Negative: Chest pain, fever, chills, abdominal pain  Physical Exam  BP (!) 168/90   Pulse 78   Temp 98 F (36.7 C)   Resp 20   Ht 5' 2 (1.575 m)   Wt 63 kg   SpO2 100%   BMI 25.42 kg/m  Gen:   Awake, no distress   Resp:  Normal effort  MSK:   Moves extremities without difficulty  Other:  No obvious abnormality with auscultation of heart or lungs, patient talking on room air in full sentences comfortably  Medical Decision Making  Medically screening exam initiated at 5:44 PM.  Appropriate orders placed.  Madison Clements was informed that the remainder of the evaluation will be completed by another provider, this initial triage assessment does not replace that evaluation, and the importance of remaining in the ED until their evaluation is complete.  Orders: CBC, BMP, EKG, CXR, suspicious for PE but due to elevated creatinine in past will hold off on CTA until creatinine is back   Madison Clements, [place] 04/10/24 1748

## 2024-04-10 NOTE — ED Notes (Signed)
 Patient d/c as requested by primary nurse. Patient ok to be d/c'd and turned to ready to be d/c'd.

## 2024-04-10 NOTE — ED Provider Notes (Signed)
 [place] EMERGENCY DEPARTMENT AT [facility] Provider Note   CSN: [id number] Arrival date & time: 04/10/24  1705     Patient presents with: Shortness of Breath   Madison Clements is a 78 y.o. female.  {Add pertinent medical, surgical, social history, OB history to HPI:32947} HPI     Has had 4 thoracentesis before A few days ago will have a little bit more pressure feeling back and chest, pain with deep breaths in left side of the chest starting last night, tried different positions which did not help, got up, laying in bed could not get comfortable, sharp pain has continued throughout the day , whne take a deep breath is 8/10, normal breath is a 4/10, more of a pressure Shortness of breath.  No fever. Some cough not unusual. Swelling feet and ankles.  Since Sunday.  Started taking lasix , similar pattern to before. No nausea, vomiting, no black or bloody stools no appetite. No runny nose or sore throat.   Past Medical History:  Diagnosis Date   Allergy 1990   Morphine following surgery   Arthritis    generalized   Atypical mole 02/24/2015   RGHT LATERAL THIGH MODERATE   Atypical mole 01/20/2022   Left Breast (mild)   Atypical mole 01/20/2022   Mid Back (mild)   Atypical nevi 02/24/2015   RIGHT NECK MILD   Atypical nevi 08/25/2015   LEFT UPPER ARM MILD   Atypical nevi 08/25/2015   LEFT LATERAL FOREARM MILD/FREE   Atypical nevi 02/23/2016   LEFT THIGH MODERATE/FREE   Atypical nevi 02/23/2016   LEFT MEDIAL LEG MODERATE/FREE   Atypical nevi 02/23/2016   LEFT UPPER BACK MILD /FREE   Atypical nevi 08/23/2016   RIGHT ANT PROX THIGH MILD /FREE   Atypical nevi 07/25/2018   MID LOWER BACK MODERATE   Atypical nevi 01/22/2019   LEFT MID BACK MILD /FREE   Atypical nevi 01/22/2019   LEFT NECK MILD   Atypical nevi 08/06/2019   LEFT INNER BREAST MODERATE W/S   Atypical nevi 08/06/2019   LEFT OUTER SIDE MILD/FREE   Blood transfusion without reported diagnosis     hx of   Breast cancer (HCC) 2022   CAD (coronary artery disease), native coronary artery 02/07/2019   Minimal CAD at the time of cath in 2016 in [facility]    Family history of breast cancer 08/19/2021   Family history of pancreatic cancer 08/19/2021   Fatty liver 05/21/2020   Gastric polyps 10/02/2018   EGD 03/2015; benign   GERD (gastroesophageal reflux disease) 10/02/2018   on meds   History of melanoma 10/02/2018   Abdomen 2012; 2013; s/p local excisions.    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 DISTAL LOWER LEG TREATED BY DR. GEORGENE   Sleep apnea    Sleep apnea 05/26/2020   uses CPAP   Tricuspid regurgitation    moderate by echo 04/2023     Prior to Admission medications   Medication Sig Start Date End Date Taking? Authorizing Provider  anastrozole  (ARIMIDEX ) 1  MG tablet Take 1 tablet (1 mg total) by mouth daily. 01/15/24   [name], MD  diltiazem  (CARDIZEM  CD) 300 MG 24 hr capsule Take 1 capsule (300 mg total) by mouth daily. 06/20/23   [name], MD  ezetimibe  (ZETIA ) 10 MG tablet Take 1 tablet (10 mg total) by mouth daily. 12/13/23   [name], MD  methylPREDNISolone  (MEDROL  DOSEPAK) 4 MG TBPK tablet Take once daily after breakfast, start at 6 tabs on first day then decrease 1 tab a day until finish. 03/26/24   [name], MD  omeprazole  (PRILOSEC) 20 MG capsule Take 20 mg by mouth daily. 09/12/23   [provider]  ondansetron  (ZOFRAN ) 8 MG tablet Take 1 tablet (8 mg total) by mouth every 8 (eight) hours as needed for nausea or vomiting. 02/12/24   [name], MD   palbociclib  (IBRANCE ) 75 MG tablet Take 1 tablet (75 mg total) by mouth daily. Take for 14 days on, 7 days off, repeat every 21 days. 03/04/24   Burton, Lacie K, NP  Prenatal Vit-Fe Fumarate-FA (PRENATAL VITAMIN PO) Take by mouth daily. Prenatal w/folic acid po daily    [provider]  Probiotic Product (ADVANCED PROBIOTIC PO) Take 1 capsule by mouth daily.    [provider]  rosuvastatin  (CRESTOR ) 20 MG tablet Take 1 tablet (20 mg total) by mouth daily. 12/13/23   [name], MD    Allergies: Morphine, Metoprolol , Nebivolol , and Morphine and codeine    Review of Systems  Updated Vital Signs BP (!) 168/90   Pulse 78   Temp 98 F (36.7 C)   Resp 20   Ht 5' 2 (1.575 m)   Wt 63 kg   SpO2 100%   BMI 25.42 kg/m   Physical Exam  (all labs ordered are listed, but only abnormal results are displayed) Labs Reviewed  BASIC METABOLIC PANEL WITH GFR - Abnormal; Notable for the following components:      Result Value   Potassium 3.4 (*)    Glucose, Bld 100 (*)    All other components within normal limits  CBC - Abnormal; Notable for the following components:   RBC 3.41 (*)    Hemoglobin 11.6 (*)    HCT 35.1 (*)    MCV 102.9 (*)    nRBC 0.3 (*)    All other components within normal limits    EKG: EKG Interpretation Date/Time:  Wednesday April 10 2024 17:53:44 EDT Ventricular Rate:  71 PR Interval:  191 QRS Duration:  96 QT Interval:  402 QTC Calculation: 437 R Axis:   -19  Text Interpretation: Sinus rhythm Borderline left axis deviation Low voltage, precordial leads RSR' in V1 or V2, right VCD or RVH Confirmed by Ula Barter [phone]) on 04/10/2024 6:00:44 PM  Radiology: IR US  CHEST Result Date: 04/10/2024 INDICATION: 78 year old female. History of breast cancer with recurrent left-sided pleural effusion. Increasing shortness of breath. Request for therapeutic thoracentesis. EXAM: CHEST ULTRASOUND COMPARISON:  None Available. FINDINGS: No significant  pocket of fluid or percutaneous window to allow safe thoracentesis. Risks outweigh the benefits. IMPRESSION: No safe window for percutaneous thoracentesis. Read by: Delon Beagle, NP Electronically Signed   By: Ester Sides M.D.   On: 04/10/2024 14:51    {Document cardiac monitor, telemetry assessment procedure when appropriate:32947} Procedures   Medications Ordered in the ED - No data to display    {Click here for ABCD2, HEART and other calculators REFRESH Note before signing:1}  Medical Decision Making Amount and/or Complexity of Data Reviewed Labs: ordered. Radiology: ordered.  Risk Prescription drug management.   ***  {Document critical care time when appropriate  Document review of labs and clinical decision tools ie CHADS2VASC2, etc  Document your independent review of radiology images and any outside records  Document your discussion with family members, caretakers and with consultants  Document social determinants of health affecting pt's care  Document your decision making why or why not admission, treatments were needed:32947:::1}   Final diagnoses:  None    ED Discharge Orders     None

## 2024-04-11 ENCOUNTER — Other Ambulatory Visit: Payer: Self-pay

## 2024-04-11 DIAGNOSIS — C50812 Malignant neoplasm of overlapping sites of left female breast: Secondary | ICD-10-CM

## 2024-04-11 DIAGNOSIS — J90 Pleural effusion, not elsewhere classified: Secondary | ICD-10-CM

## 2024-04-11 NOTE — Progress Notes (Signed)
 Verbal order w/readback from Dr. Lanny for IR Thoracentesis to be done w/in the next 1 to 2 days d/t pt's recent CT Scan results while in ER on 04/10/2024.  Order placed and Central Scheduling notified to contact pt to schedule thoracentesis.

## 2024-04-12 ENCOUNTER — Ambulatory Visit (HOSPITAL_COMMUNITY)
Admission: RE | Admit: 2024-04-12 | Discharge: 2024-04-12 | Disposition: A | Discharge status: Home / Self Care | Source: Ambulatory Visit

## 2024-04-12 ENCOUNTER — Other Ambulatory Visit (HOSPITAL_COMMUNITY): Payer: Self-pay

## 2024-04-12 ENCOUNTER — Ambulatory Visit (HOSPITAL_COMMUNITY)
Admission: RE | Admit: 2024-04-12 | Discharge: 2024-04-12 | Disposition: A | Discharge status: Home / Self Care | Source: Ambulatory Visit | Attending: Hematology | Admitting: Hematology

## 2024-04-12 ENCOUNTER — Other Ambulatory Visit (HOSPITAL_COMMUNITY)

## 2024-04-12 DIAGNOSIS — J90 Pleural effusion, not elsewhere classified: Secondary | ICD-10-CM

## 2024-04-12 DIAGNOSIS — C50812 Malignant neoplasm of overlapping sites of left female breast: Secondary | ICD-10-CM | POA: Insufficient documentation

## 2024-04-12 DIAGNOSIS — J910 Malignant pleural effusion: Secondary | ICD-10-CM | POA: Insufficient documentation

## 2024-04-12 DIAGNOSIS — Z48813 Encounter for surgical aftercare following surgery on the respiratory system: Secondary | ICD-10-CM | POA: Diagnosis not present

## 2024-04-12 DIAGNOSIS — Z170 Estrogen receptor positive status [ER+]: Secondary | ICD-10-CM | POA: Insufficient documentation

## 2024-04-12 HISTORY — PX: IR THORACENTESIS ASP PLEURAL SPACE W/IMG GUIDE: IMG5380

## 2024-04-12 MED ORDER — LIDOCAINE-EPINEPHRINE 1 %-1:100000 IJ SOLN
20.0000 mL | Freq: Once | INTRAMUSCULAR | Status: AC
  Administered 2024-04-12: 10 mL via INTRADERMAL

## 2024-04-12 MED ORDER — LIDOCAINE-EPINEPHRINE 1 %-1:100000 IJ SOLN
INTRAMUSCULAR | Status: AC
  Filled 2024-04-12: qty 1

## 2024-04-12 NOTE — Procedures (Signed)
 PROCEDURE SUMMARY:  Successful image-guided diagnostic and therapeutic thoracentesis from the left chest.  Yielded 400 milliliters of amber fluid.  No immediate complications.  EBL: zero Patient tolerated well.   Specimen not sent for labs.  Post-procedure CXR ordered and reviewed prior to departure from department.   Please see imaging section of Epic for full dictation.  Zen Cedillos NP 04/12/2024 10:43 AM

## 2024-04-15 ENCOUNTER — Ambulatory Visit

## 2024-04-16 ENCOUNTER — Other Ambulatory Visit: Payer: Self-pay

## 2024-04-17 ENCOUNTER — Other Ambulatory Visit: Payer: Self-pay | Admitting: Nurse Practitioner

## 2024-04-17 DIAGNOSIS — J90 Pleural effusion, not elsewhere classified: Secondary | ICD-10-CM

## 2024-04-17 DIAGNOSIS — Z170 Estrogen receptor positive status [ER+]: Secondary | ICD-10-CM

## 2024-04-18 ENCOUNTER — Telehealth: Payer: Self-pay | Admitting: Hematology

## 2024-04-18 DIAGNOSIS — M170 Bilateral primary osteoarthritis of knee: Secondary | ICD-10-CM | POA: Diagnosis not present

## 2024-04-22 ENCOUNTER — Ambulatory Visit

## 2024-04-25 ENCOUNTER — Encounter: Payer: Self-pay | Admitting: Hematology

## 2024-04-25 ENCOUNTER — Inpatient Hospital Stay

## 2024-04-25 ENCOUNTER — Inpatient Hospital Stay: Attending: Hematology | Admitting: Hematology

## 2024-04-25 ENCOUNTER — Other Ambulatory Visit: Payer: Self-pay

## 2024-04-25 VITALS — BP 160/60 | HR 65 | Temp 98.2°F | Resp 18 | Ht 62.0 in | Wt 144.7 lb

## 2024-04-25 DIAGNOSIS — M8580 Other specified disorders of bone density and structure, unspecified site: Secondary | ICD-10-CM

## 2024-04-25 DIAGNOSIS — D519 Vitamin B12 deficiency anemia, unspecified: Secondary | ICD-10-CM | POA: Diagnosis not present

## 2024-04-25 DIAGNOSIS — C50812 Malignant neoplasm of overlapping sites of left female breast: Secondary | ICD-10-CM

## 2024-04-25 DIAGNOSIS — Z170 Estrogen receptor positive status [ER+]: Secondary | ICD-10-CM

## 2024-04-25 MED ORDER — CYANOCOBALAMIN 1000 MCG/ML IJ SOLN
1000.0000 ug | Freq: Once | INTRAMUSCULAR | Status: AC
  Administered 2024-04-25: 1000 ug via INTRAMUSCULAR
  Filled 2024-04-25: qty 1

## 2024-04-25 NOTE — Assessment & Plan Note (Signed)
 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 Biopsy 08/11/21 confirmed invasive mammary carcinoma. -left lumpectomy on 09/09/21 by Dr. 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 No nodes were removed due to her age. -she received adjuvant RT, completed in 11/2021 -she started adjuvant tamoxifen  in 12/2021, but could not tolerate. She subsequently tried anastrozole  and still can not tolerate. She stopped in 04/2022 -Screening mammogram in May 2024 were negative. - She developed pleural effusion in April 2025, cytology unfortunately showed malignant cells, consistent with breast primary. ERand PR moderately positive, Her2 negative (IHC 0) -FDG PET 01/08/2024 was negative for hypermetabolic lesion or hypermetabolism around the pleura.  [place]  PET scan 01/26/2024 showed diffuse bone mets and mediastinal adenopathy. Pleura was negative on PET -her skin (scalp) biopsy also showed metastatic breast cancer, ER and PR positive, HER2 negative.   -She has started anastrozole  in 12/2023  - Due to her recurrent pleural effusion, and high disease burden, I will add a low-dose Ibrance  for better disease control - Continue Zometa , will change to every 3 months due to her diffuse bone metastasis. - Due to severe joint pain especially in her knees, she stopped her anastrozole  and Ibrance  in early July.

## 2024-04-25 NOTE — Progress Notes (Signed)
 Verbal order w/readback order from Dr. Lanny for [employer] to be drawn on 04/23/2024 prior to pt's PET Scan.  Order placed in EPIC and in [employer] portal.  [employer] kt and requisition given to [facility] Lab Receptionist.    Verbal order w/readback from Dr. Lanny for [employer] +Liquid.  Order placed in EPIC and in [employer] Portal.  [facility] requisition given to [facility] Lab Receptionist.

## 2024-04-25 NOTE — Progress Notes (Signed)
 [facility]   Telephone:([phone] Fax:([phone]) [phone]   Clinic Follow up Note   Patient Care Team: [name], MD as PCP - General (Family Medicine) [name], MD as PCP - Cardiology (Cardiology) [name], MD as Consulting Physician (Cardiology) [name], MD as Consulting Physician (Ophthalmology) [name], PA-C (Inactive) as Physician Assistant (Dermatology) [name], MD as Consulting Physician (General Surgery) [name], MD as Consulting Physician (Hematology) [name], MD as Attending Physician (Radiation Oncology) [name], [facility] (Inactive) (Pharmacist)  Date of Service:  04/25/2024  CHIEF COMPLAINT: f/u of metastatic breast cancer  CURRENT THERAPY:  Supportive care  Oncology History   Malignant neoplasm of overlapping sites of left breast in female, estrogen receptor positive (HCC) Stage IA, pT2, cN0, ER+/PR+/HER2-, Grade 2, RS 21, 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 Biopsy 08/11/21 confirmed invasive mammary carcinoma. -left lumpectomy on 09/09/21 by Dr. 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 No nodes were removed due to her age. -she received adjuvant RT, completed in 11/2021 -she started adjuvant tamoxifen  in 12/2021, but could not tolerate. She subsequently tried anastrozole  and still can not tolerate. She stopped in 04/2022 -Screening mammogram in May 2024 were negative. - She developed pleural effusion in April 2025, cytology unfortunately showed malignant cells, consistent with breast primary. ERand PR moderately positive, Her2 negative (IHC 0) -FDG PET 01/08/2024 was negative for hypermetabolic lesion or hypermetabolism around the pleura.  [place]  PET scan 01/26/2024 showed diffuse bone mets and mediastinal adenopathy. Pleura was negative on PET -her skin (scalp) biopsy also showed metastatic breast  cancer, ER and PR positive, HER2 negative.   -She has started anastrozole  in 12/2023  - Due to her recurrent pleural effusion, and high disease burden, I will add a low-dose Ibrance  for better disease control - Continue Zometa , will change to every 3 months due to her diffuse bone metastasis. - Due to severe joint pain especially in her knees, she stopped her anastrozole  and Ibrance  in early July.   B12 deficiency anemia -Patient developed anemia with hemoglobin 9.2 in October 2024, anemia workup showed low B12 level at 162, intrinsic factor was negative -Patient has been on B12 injection since then with loading dose initially  Assessment & Plan Metastatic breast cancer with bone and intrathoracic lymph node involvement Recent PET scan in May showed diffuse positive breast cancer metastasis through the bone and small estrogen receptor-positive metastatic lymph nodes in the mediastinum. Current treatment options are limited due to intolerance to previous medications (anastrozole  and Ibrance ). Discussed potential use of tamoxifen , but she had previous intolerance. She is not currently on any active treatment for cancer. 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 Effusion is recurrent, but current volume does not warrant a permanent drainage tube. Discussed the possibility of lobulated effusion due to scar tissue, but current imaging suggests free fluid. - Schedule thoracentesis in two weeks - Place standing orders for thoracentesis at the end of each month for the next three months -  Monitor symptoms and  reschedule thoracentesis if needed  Anemia secondary to malignancy and/or therapy Recent ER visit showed labs were not significantly abnormal. Symptoms of fatigue and shortness of breath noted, but no immediate need for blood transfusion. - Administer B12 injection today and again in September  Plan - Recent lab, ED visit, and procedure notes reviewed - Patient is reluctant to try tamoxifen  due to her poor tolerance in the past - She is scheduled for repeated thoracentesis in 2 weeks, I placed another order for future so she can call and schedule. - She is scheduled to return to clinic in a month for follow-up and Zometa  infusion. - Will obtain Guardant360 on her next lab, and the Tempus from her scalp biopsy, to see if she is a candidate for targeted therapy. - Continue supportive care.     SUMMARY OF ONCOLOGIC HISTORY: Oncology History Overview Note   Cancer Staging  Malignant neoplasm of overlapping sites of left breast in female, estrogen receptor positive (HCC) Staging form: Breast, AJCC 8th Edition - Clinical stage from 08/11/2021: Stage IA (cT1c, cN0, cM0, G2, ER+, PR+, HER2-) - Signed by [name], MD on 08/18/2021     Malignant neoplasm of overlapping sites of left breast in female, estrogen receptor positive (HCC)  07/27/2021 Mammogram   EXAM: DIGITAL DIAGNOSTIC UNILATERAL LEFT MAMMOGRAM WITH TOMOSYNTHESIS AND CAD; ULTRASOUND LEFT BREAST LIMITED  IMPRESSION: 1. There is a suspicious mass in the left breast at 9 o'clock measuring 1.8 cm.   2. There is a 1.0 cm group of suspicious linear calcifications anterior to the suspicious mass in the left breast, as well as a faint 2 mm group which lie 1 cm lateral to the linear calcifications.   3.  No evidence of left axillary lymphadenopathy   08/11/2021 Cancer Staging   Staging form: Breast, AJCC 8th Edition - Clinical stage from 08/11/2021: Stage IA (cT1c, cN0, cM0, G2, ER+, PR+, HER2-) - Signed by  [name], MD on 08/18/2021 Stage prefix: Initial diagnosis Histologic grading system: 3 grade system   08/11/2021 Initial Biopsy   Diagnosis 1. Breast, left, needle core biopsy, 9 o'clock, ribbon clip - INVASIVE MAMMARY CARCINOMA - SEE COMMENT 2. Breast, left, needle core biopsy, lower inner quadrant, x clip - INVASIVE MAMMARY CARCINOMA - MAMMARY CARCINOMA IN-SITU - CALCIFICATIONS - SEE COMMENT Microscopic Comment 1. The biopsy material shows an infiltrative proliferation of cells with arranged linearly and in small clusters. Based on the biopsy, the carcinoma appears Nottingham grade 2 of 3 and measures 1.2 cm in greatest linear extent. 2. Based on the biopsy, the carcinoma appears Nottingham grade 2 of 3 and measures 0.2 cm in greatest linear extent.  1. PROGNOSTIC INDICATORS Results: The tumor cells are NEGATIVE for Her2 (1+). Estrogen Receptor: 100%, POSITIVE, STRONG STAINING INTENSITY Progesterone Receptor: 60%, POSITIVE, STRONG STAINING INTENSITY Proliferation Marker Ki67: 10%  2. PROGNOSTIC INDICATORS Results: The tumor cells are NEGATIVE for Her2 (1+). Estrogen Receptor: 100%, POSITIVE, STRONG STAINING INTENSITY Progesterone Receptor: 60%, POSITIVE, STRONG STAINING INTENSITY Proliferation Marker Ki67: 15%   08/13/2021 Mammogram   EXAM: DIGITAL DIAGNOSTIC UNILATERAL RIGHT MAMMOGRAM WITH TOMOSYNTHESIS AND CAD  IMPRESSION: No mammographic evidence for malignancy.   08/16/2021 Initial Diagnosis   Malignant neoplasm of overlapping sites of left breast in female, estrogen receptor positive (HCC)   08/25/2021 Imaging   EXAM: BILATERAL BREAST MRI WITH AND WITHOUT CONTRAST  IMPRESSION: 1. Biopsy-proven invasive ductal carcinoma measuring approximately 2.8 x 1.7 x 1.5 cm in the inner breast at middle depth, associated with the  ribbon shaped tissue marking clip placed at the time of core needle biopsy. Enhancement extends approximately 1.4 cm posterior to the clip. 2.  Approximate 1.8 cm post biopsy hematoma with associated rim enhancement at the site of the biopsy-proven invasive ductal carcinoma and DCIS in the LOWER INNER QUADRANT at anterior depth associated with the X shaped tissue marking clip. 3. In combination, the overall enhancement spans approximately 4 cm. 4. No MRI evidence of malignancy involving the RIGHT breast. 5. No pathologic lymphadenopathy.   08/27/2021 Genetic Testing   egative hereditary cancer genetic testing: no pathogenic variants detected in [facility] BRCAPlus Panel and CancerNext-Expanded +RNAinsight Panel.  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  The report dates are 08/27/2021 and 08/30/2021.   The BRCAplus panel offered by [employer] and includes sequencing and deletion/duplication analysis for the following 8 genes: ATM, BRCA1, BRCA2, CDH1, CHEK2, PALB2, PTEN, and TP53.  The CancerNext-Expanded gene panel offered by [facility] and includes sequencing, rearrangement, and RNA analysis for the following 77 genes: AIP, ALK, APC, ATM, AXIN2, BAP1, BARD1, BLM, BMPR1A, BRCA1, BRCA2, BRIP1, CDC73, CDH1, CDK4, CDKN1B, CDKN2A, CHEK2, CTNNA1, DICER1, FANCC, FH, FLCN, GALNT12, KIF1B, LZTR1, MAX, MEN1, MET, MLH1, MSH2, MSH3, MSH6, MUTYH, NBN, NF1, NF2, NTHL1, PALB2, PHOX2B, PMS2, POT1, PRKAR1A, PTCH1, PTEN, RAD51C, RAD51D, RB1, RECQL, RET, SDHA, SDHAF2, SDHB, SDHC, SDHD, SMAD4, SMARCA4, SMARCB1, SMARCE1, STK11, SUFU, TMEM127, TP53, TSC1, TSC2, VHL and XRCC2 (sequencing and deletion/duplication); EGFR, EGLN1, HOXB13, KIT, MITF, PDGFRA, POLD1, and POLE (sequencing only); EPCAM and GREM1 (deletion/duplication only).    09/09/2021 Definitive Surgery   FINAL MICROSCOPIC DIAGNOSIS:   A. BREAST, LEFT, LUMPECTOMY:  -  Invasive ductal carcinoma, Nottingham grade 2 of 3, 3.5 cm  -  Ductal carcinoma in-situ, intermediate grade  -  Margins uninvolved by carcinoma (<0.1 cm; anterior)  -  Previous biopsy site changes  present    09/09/2021 Oncotype testing   Oncotype DX was obtained on the final surgical sample and the recurrence score of 21 predicts a risk of recurrence outside the breast over the next 9 years of 7%, if the patient's only systemic therapy is an antiestrogen for 5 years.  It also predicts no benefit from chemotherapy.   10/20/2021 - 11/16/2021 Radiation Therapy   Site Technique Total Dose (Gy) Dose per Fx (Gy) Completed Fx Beam Energies  Breast, Left: Breast_L_axilla 3D 40.05/40.05 2.67 15/15 10X  Breast, Left: Breast_L_Bst 3D 10/10 2 5/5 6X, 10X     12/06/2021 - 04/2022 Anti-estrogen oral therapy   Tamoxifen  x 5 years unable to tolerate, attempted anastrozole  and had similar side effects.  Opted to forego adjuvant antiestrogen therapy      Discussed the use of AI scribe software for clinical note transcription with the patient, who gave verbal consent to proceed.  History of Present Illness Madison Clements is an 78 year old female with metastatic breast cancer who presents for follow-up.  She experienced symptoms of pleural effusion, including shortness of breath, fatigue, ankle swelling, and increased heart rate. A thoracentesis was performed during an ER visit, removing 400 cc of fluid and providing significant relief.  She has metastatic breast cancer with known metastasis to the bone and lymph nodes. Previous treatments included anastrozole  and Ibrance , which were discontinued due to joint pain and low white blood cell count. Recent imaging, including a PET scan in May and a CT scan in July, showed diffuse metastatic disease.  She has been off her cancer medication for about  a month, which she believes has improved her joint pain and shoulder pain. Her energy levels have improved, although she experienced fatigue similar to when she needed a blood transfusion. Recent labs did not indicate significant issues.     All other systems were reviewed with the patient and are  negative.  MEDICAL HISTORY:  Past Medical History:  Diagnosis Date   Allergy 1990   Morphine following surgery   Arthritis    generalized   Atypical mole 02/24/2015   RGHT LATERAL THIGH MODERATE   Atypical mole 01/20/2022   Left Breast (mild)   Atypical mole 01/20/2022   Mid Back (mild)   Atypical nevi 02/24/2015   RIGHT NECK MILD   Atypical nevi 08/25/2015   LEFT UPPER ARM MILD   Atypical nevi 08/25/2015   LEFT LATERAL FOREARM MILD/FREE   Atypical nevi 02/23/2016   LEFT THIGH MODERATE/FREE   Atypical nevi 02/23/2016   LEFT MEDIAL LEG MODERATE/FREE   Atypical nevi 02/23/2016   LEFT UPPER BACK MILD /FREE   Atypical nevi 08/23/2016   RIGHT ANT PROX THIGH MILD /FREE   Atypical nevi 07/25/2018   MID LOWER BACK MODERATE   Atypical nevi 01/22/2019   LEFT MID BACK MILD /FREE   Atypical nevi 01/22/2019   LEFT NECK MILD   Atypical nevi 08/06/2019   LEFT INNER BREAST MODERATE W/S   Atypical nevi 08/06/2019   LEFT OUTER SIDE MILD/FREE   Blood transfusion without reported diagnosis    hx of   Breast cancer (HCC) 2022   CAD (coronary artery disease), native coronary artery 02/07/2019   Minimal CAD at the time of cath in 2016 in [place]    Family history of breast cancer 08/19/2021   Family history of pancreatic cancer 08/19/2021   Fatty liver 05/21/2020   Gastric polyps 10/02/2018   EGD 03/2015; benign   GERD (gastroesophageal reflux disease) 10/02/2018   on meds   History of melanoma 10/02/2018   Abdomen 2012; 2013; s/p local excisions.    Hyperlipidemia    on meds   Hypertension    on meds   Melanoma (HCC) 2012   MM- central lower abdomen- (ECX) [facility]   Osteopenia    Personal history of radiation therapy    Polyp of colon, adenomatous 10/02/2018   Colonoscopy 03/2015; recheck in 2021   PONV (postoperative nausea and vomiting)    Pulmonary hypertension, primary (HCC) 10/02/2018   SCCA (squamous cell carcinoma) of skin 09/18/2017    RIGHT ANT. DISTAL LOWER LEG TREATED BY DR. GEORGENE   Sleep apnea    Sleep apnea 05/26/2020   uses CPAP   Tricuspid regurgitation    moderate by echo 04/2023    SURGICAL HISTORY: Past Surgical History:  Procedure Laterality Date   ABDOMINAL HYSTERECTOMY  1990   APPENDECTOMY  1962   BLADDER SUSPENSION  1990   BREAST LUMPECTOMY Left 08/2021   BREAST LUMPECTOMY WITH RADIOACTIVE SEED LOCALIZATION Left 09/09/2021   Procedure: LEFT BREAST LUMPECTOMY WITH RADIOACTIVE SEED LOCALIZATION X2;  Surgeon: [name], MD;  Location: [facility];  Service: General;  Laterality: Left;   COLONOSCOPY  2016   in [place]-hx of polyps   EYE SURGERY  2013 torn retina laser   IR THORACENTESIS ASP PLEURAL SPACE W/IMG GUIDE  02/07/2024   IR THORACENTESIS ASP PLEURAL SPACE W/IMG GUIDE  04/12/2024   MENISCUS REPAIR Right 2001   right knee   RIGHT HEART CATH AND CORONARY ANGIOGRAPHY N/A 06/20/2023   Procedure:  RIGHT HEART CATH AND CORONARY ANGIOGRAPHY;  Surgeon: Rolan Ezra RAMAN, MD;  Location: [facility] INVASIVE CV LAB;  Service: Cardiovascular;  Laterality: N/A;   thumb surgery Left    TONSILLECTOMY AND ADENOIDECTOMY     TRANSESOPHAGEAL ECHOCARDIOGRAM (CATH LAB) N/A 01/04/2024   Procedure: TRANSESOPHAGEAL ECHOCARDIOGRAM;  Surgeon: Santo Stanly LABOR, MD;  Location: [facility] INVASIVE CV LAB;  Service: Cardiovascular;  Laterality: N/A;   tuabl ligation     TUBAL LIGATION  1975   WISDOM TOOTH EXTRACTION      I have reviewed the social history and family history with the patient and they are unchanged from previous note.  ALLERGIES:  is allergic to morphine, metoprolol , nebivolol , and morphine and codeine.  MEDICATIONS:  Current Outpatient Medications  Medication Sig Dispense Refill   diltiazem  (CARDIZEM  CD) 300 MG 24 hr capsule TAKE 1 CAPSULE EVERY DAY 90 capsule 2   ezetimibe  (ZETIA ) 10 MG tablet Take 1 tablet (10 mg total) by mouth daily. 90 tablet 3   omeprazole  (PRILOSEC) 20 MG capsule Take 20 mg  by mouth daily.     ondansetron  (ZOFRAN ) 8 MG tablet Take 1 tablet (8 mg total) by mouth every 8 (eight) hours as needed for nausea or vomiting. 20 tablet 2   Prenatal Vit-Fe Fumarate-FA (PRENATAL VITAMIN PO) Take by mouth daily. Prenatal w/folic acid po daily     Probiotic Product (ADVANCED PROBIOTIC PO) Take 1 capsule by mouth daily.     rosuvastatin  (CRESTOR ) 20 MG tablet Take 1 tablet (20 mg total) by mouth daily. 90 tablet 3   anastrozole  (ARIMIDEX ) 1 MG tablet Take 1 tablet (1 mg total) by mouth daily. (Patient not taking: Reported on 04/25/2024) 90 tablet 1   methylPREDNISolone  (MEDROL  DOSEPAK) 4 MG TBPK tablet Take once daily after breakfast, start at 6 tabs on first day then decrease 1 tab a day until finish. (Patient not taking: Reported on 04/25/2024) 21 tablet 0   palbociclib  (IBRANCE ) 75 MG tablet Take 1 tablet (75 mg total) by mouth daily. Take for 14 days on, 7 days off, repeat every 21 days. (Patient not taking: Reported on 04/25/2024) 21 tablet 0   No current facility-administered medications for this visit.    PHYSICAL EXAMINATION: ECOG PERFORMANCE STATUS: 1 - Symptomatic but completely ambulatory  Vitals:   04/25/24 1147 04/25/24 1150  BP: (!) 182/66 (!) 160/60  Pulse: 65   Resp: 18   Temp: 98.2 F (36.8 C)   SpO2: 100%    Wt Readings from Last 3 Encounters:  04/25/24 144 lb 11.2 oz (65.6 kg)  04/10/24 139 lb (63 kg)  03/25/24 145 lb 4.8 oz (65.9 kg)     GENERAL:alert, no distress and comfortable SKIN: skin color, texture, turgor are normal, no rashes or significant lesions EYES: normal, Conjunctiva are pink and non-injected, sclera clear NECK: supple, thyroid  normal size, non-tender, without nodularity LYMPH:  no palpable lymphadenopathy in the cervical, axillary  LUNGS: clear to auscultation and percussion with normal breathing effort HEART: regular rate & rhythm and no murmurs and no lower extremity edema ABDOMEN:abdomen soft, non-tender and normal bowel  sounds Musculoskeletal:no cyanosis of digits and no clubbing  NEURO: alert & oriented x 3 with fluent speech, no focal motor/sensory deficits  Physical Exam CHEST: Breath sounds diminished at the base, normal on the right side and middle of the chest.  LABORATORY DATA:  I have reviewed the data as listed    Latest Ref Rng & Units 04/10/2024    5:43 PM 03/25/2024  1:09 PM 03/04/2024   10:19 AM  CBC  WBC 4.0 - 10.5 K/uL 6.2  3.0  1.6   Hemoglobin 12.0 - 15.0 g/dL 88.3  89.4  89.7   Hematocrit 36.0 - 46.0 % 35.1  30.7  29.5   Platelets 150 - 400 K/uL 230  214  113         Latest Ref Rng & Units 04/10/2024    5:43 PM 03/25/2024    1:09 PM 03/04/2024   10:19 AM  CMP  Glucose 70 - 99 mg/dL 899  883  885   BUN 8 - 23 mg/dL 14  17  12    Creatinine 0.44 - 1.00 mg/dL 9.13  8.77  8.89   Sodium 135 - 145 mmol/L 140  138  141   Potassium 3.5 - 5.1 mmol/L 3.4  3.4  3.4   Chloride 98 - 111 mmol/L 105  106  108   CO2 22 - 32 mmol/L 22  24  23    Calcium  8.9 - 10.3 mg/dL 9.5  9.5  9.3   Total Protein 6.5 - 8.1 g/dL  6.3  6.4   Total Bilirubin 0.0 - 1.2 mg/dL  1.0  0.9   Alkaline Phos 38 - 126 U/L  84  80   AST 15 - 41 U/L  25  22   ALT 0 - 44 U/L  19  15       RADIOGRAPHIC STUDIES: I have personally reviewed the radiological images as listed and agreed with the findings in the report. No results found.    Orders Placed This Encounter  Procedures   IR THORACENTESIS ASP PLEURAL SPACE W/IMG GUIDE    Standing Status:   Future    Expiration Date:   04/25/2025    Are labs required for specimen collection?:   No    Reason for Exam (SYMPTOM  OR DIAGNOSIS REQUIRED):   symptom relieve    Preferred Imaging Location?:   [facility]   All questions were answered. The patient knows to call the clinic with any problems, questions or concerns. No barriers to learning was detected. The total time spent in the appointment was 40 minutes, including review of chart and various tests results,  discussions about plan of care and coordination of care plan     Onita Mattock, MD 04/25/2024

## 2024-04-25 NOTE — Assessment & Plan Note (Signed)
-  Patient developed anemia with hemoglobin 9.2 in October 2024, anemia workup showed low B12 level at 162, intrinsic factor was negative -Patient has been on B12 injection since then with loading dose initially

## 2024-04-29 ENCOUNTER — Other Ambulatory Visit: Payer: Self-pay

## 2024-05-04 ENCOUNTER — Encounter: Payer: Self-pay | Admitting: Hematology

## 2024-05-09 ENCOUNTER — Ambulatory Visit (HOSPITAL_COMMUNITY)
Admission: RE | Admit: 2024-05-09 | Discharge: 2024-05-09 | Disposition: A | Discharge status: Home / Self Care | Source: Ambulatory Visit | Attending: Radiology | Admitting: Radiology

## 2024-05-09 ENCOUNTER — Ambulatory Visit (HOSPITAL_COMMUNITY)
Admission: RE | Admit: 2024-05-09 | Discharge: 2024-05-09 | Disposition: A | Discharge status: Home / Self Care | Source: Ambulatory Visit | Attending: Nurse Practitioner | Admitting: Nurse Practitioner

## 2024-05-09 DIAGNOSIS — J90 Pleural effusion, not elsewhere classified: Secondary | ICD-10-CM

## 2024-05-09 DIAGNOSIS — C50812 Malignant neoplasm of overlapping sites of left female breast: Secondary | ICD-10-CM | POA: Insufficient documentation

## 2024-05-09 DIAGNOSIS — Z170 Estrogen receptor positive status [ER+]: Secondary | ICD-10-CM | POA: Insufficient documentation

## 2024-05-09 DIAGNOSIS — J910 Malignant pleural effusion: Secondary | ICD-10-CM | POA: Diagnosis not present

## 2024-05-09 DIAGNOSIS — C50919 Malignant neoplasm of unspecified site of unspecified female breast: Secondary | ICD-10-CM | POA: Diagnosis not present

## 2024-05-09 DIAGNOSIS — R918 Other nonspecific abnormal finding of lung field: Secondary | ICD-10-CM | POA: Diagnosis not present

## 2024-05-09 HISTORY — PX: IR THORACENTESIS ASP PLEURAL SPACE W/IMG GUIDE: IMG5380

## 2024-05-09 MED ORDER — LIDOCAINE-EPINEPHRINE 1 %-1:100000 IJ SOLN: 20.0000 mL | Freq: Once | INTRAMUSCULAR | Status: DC

## 2024-05-09 MED ORDER — LIDOCAINE-EPINEPHRINE 1 %-1:100000 IJ SOLN
INTRAMUSCULAR | Status: AC
  Filled 2024-05-09: qty 1

## 2024-05-09 NOTE — Procedures (Signed)
 Ultrasound-guided diagnostic and therapeutic left thoracentesis performed yielding 650 cc of yellow fluid. No immediate complications. Follow-up chest x-ray pending. EBL < 2 cc.

## 2024-05-14 ENCOUNTER — Encounter: Payer: Self-pay | Admitting: Hematology

## 2024-05-16 ENCOUNTER — Other Ambulatory Visit: Payer: Self-pay | Admitting: Nurse Practitioner

## 2024-05-16 ENCOUNTER — Other Ambulatory Visit: Payer: Self-pay

## 2024-05-16 DIAGNOSIS — C50812 Malignant neoplasm of overlapping sites of left female breast: Secondary | ICD-10-CM

## 2024-05-16 NOTE — Assessment & Plan Note (Signed)
 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 Biopsy 08/11/21 confirmed invasive mammary carcinoma. -left lumpectomy on 09/09/21 by Dr. 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 No nodes were removed due to her age. -she received adjuvant RT, completed in 11/2021 -she started adjuvant tamoxifen  in 12/2021, but could not tolerate. She subsequently tried anastrozole  and still can not tolerate. She stopped in 04/2022 -Screening mammogram in May 2024 were negative. - She developed pleural effusion in April 2025, cytology unfortunately showed malignant cells, consistent with breast primary. ERand PR moderately positive, Her2 negative (IHC 0) -FDG PET 01/08/2024 was negative for hypermetabolic lesion or hypermetabolism around the pleura.  [place]  PET scan 01/26/2024 showed diffuse bone mets and mediastinal adenopathy. Pleura was negative on PET -her skin (scalp) biopsy also showed metastatic breast cancer, ER and PR positive, HER2 negative.   -She has started anastrozole  in 12/2023  - Due to her recurrent pleural effusion, and high disease burden, I will add a low-dose Ibrance  for better disease control - Continue Zometa , will change to every 3 months due to her diffuse bone metastasis. - Due to severe joint pain especially in her knees, she stopped her anastrozole  and Ibrance  in early July.

## 2024-05-16 NOTE — Progress Notes (Unsigned)
 Patient Care Team: [name], MD as PCP - General (Family Medicine) [name], MD as PCP - Cardiology (Cardiology) [name], MD as Consulting Physician (Cardiology) [name], MD as Consulting Physician (Ophthalmology) [name], PA-C (Inactive) as Physician Assistant (Dermatology) [name], MD as Consulting Physician (General Surgery) [name], MD as Consulting Physician (Hematology) [name], MD as Attending Physician (Radiation Oncology) [name], [facility] (Inactive) (Pharmacist)  Clinic Day:  05/17/2024  Referring physician: [name], MD  ASSESSMENT & PLAN:   Assessment & Plan: Malignant neoplasm of overlapping sites of left breast in female, estrogen receptor positive (HCC) Stage IA, pT2, cN0, ER+/PR+/HER2-, Grade 2, RS 21, 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 Biopsy 08/11/21 confirmed invasive mammary carcinoma. -left lumpectomy on 09/09/21 by Dr. 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 No nodes were removed due to her age. -she received adjuvant RT, completed in 11/2021 -she started adjuvant tamoxifen  in 12/2021, but could not tolerate. She subsequently tried anastrozole  and still can not tolerate. She stopped in 04/2022 -Screening mammogram in May 2024 were negative. - She developed pleural effusion in April 2025, cytology unfortunately showed malignant cells, consistent with breast primary. ERand PR moderately positive, Her2 negative (IHC 0) -FDG PET 01/08/2024 was negative for hypermetabolic lesion or hypermetabolism around the pleura.  [place]  PET scan 01/26/2024 showed diffuse bone mets and mediastinal adenopathy. Pleura was negative on PET -her skin (scalp) biopsy also showed metastatic breast cancer, ER and PR positive, HER2 negative.   -She has started anastrozole  in 12/2023  - Due to her recurrent pleural effusion, and  high disease burden, I will add a low-dose Ibrance  for better disease control - Continue Zometa , will change to every 3 months due to her diffuse bone metastasis. - Due to severe joint pain especially in her knees, she stopped her anastrozole  and Ibrance  in early July. - Tempus and Guardant360 obtained 05/17/2024.   Petechiae Patient 2 small areas has what appears to be petechiae on the inner right calf. This is not tender or swollen. She reports that there were several more small areas which have faded. The persistent area is fading. She was concerned that her platelets could be low. A check of her blood count today indicates normal platelet count of 225. She does have some mild swelling around both ankles and across the tops of her feet which is itchy at times. She is able to apply cortisone cream to these areas which is helpful.  Encouraged use of hydrocortisone cream to treat itching.  Advised her to raise her feet while sitting, and recommended use of compression socks to help promote blood flow to the heart when standing or sitting for long periods of time.  Malignant pleural effusion Patient recently had thoracentesis #6.  She states that this most recent procedure was extremely painful.  She was told that scar tissue would gradually build up making it more difficult to obtain the pleural fluid.  She was like she went into this with increased anxiety.  During thoracentesis #5, she states that the radiologist hit a nerve, called it a stinger.  It caused a great deal of pain.  Patient feels like she was extremely tense going into procedure #6, making it hard to relax. She feels like she may need something to help with anxiety or sedation prior to subsequent treatments so that it is less painful.  She  has taken Valium  in the past for anxiety due to upcoming procedures.  Did very well with it and with the procedure.  Will do a short-term prescription for Valium  10 mg.  Recommend she take 1/2 to 1 tablet  2 hours prior to the procedure.  If still anxious after 1 hour, she can repeat the dose.  Valium  will help with anxiety, but may also help to serve as a muscle relaxer.  She will have a driver to and from all thoracentesis appointments.  She understands she needs to hold dosing of Xanax  on days when taking Valium .  We reviewed the risk factors of Valium  including dizziness and drowsiness.  Taking too much could reduce the respiratory drive, causing shortness of breath and respiratory emergency.  Cautioned her to take only as prescribed and only prior to thoracentesis appointments.  She voiced understanding and agreement.  Her sister, present in the room with her today, agrees to the instructions as well.  Metastatic breast cancer Patient with metastatic pleural effusion and biopsy-proven metastasis of the skin.  Tempus and Guardant360 were drawn prior to today's visit in order to evaluate for targetable mutations.  She is scheduled for a follow-up visit on 06/11/2024 and will discuss these results with Dr. Lanny and decide upon future treatment.  Hypertension Patient states blood pressure has been running relatively high.  Her doctor took her off lisinopril  due to elevated renal functions a while back.  She has been keeping a blood pressure log.  Systolic blood pressure running between 122 and 180, with mode systolic pressure around 150.  Second check of blood pressure was baseline today at 150/72.  Advised her to continue keeping a log of blood pressure and contact her cardiologist if blood pressures consistently over 160 or 165 systolic.  New blood pressure medicine may need to be started at that point.  Plan Labs reviewed. - CBC unremarkable. - Ferritin and iron panels unremarkable.  - CMP unremarkable other than mild elevation of ALT. - [id number] and Tempus drawn today. Added Valium  5 to 10 mg when needed for thoracentesis procedure. She has follow-up scheduled on 06/11/2024.   The patient  understands the plans discussed today and is in agreement with them.  She knows to contact our office if she develops concerns prior to her next appointment.  I provided 30 minutes of face-to-face time during this encounter and > 50% was spent counseling as documented under my assessment and plan.    Powell FORBES Lessen, NP  [facility] - A DEPT OF [facility] [address] [place] [address] Dept: [phone] Dept Fax: [phone]   No orders of the defined types were placed in this encounter.     CHIEF COMPLAINT:  CC: Left breast cancer, ER +  Current Treatment: Supportive care  INTERVAL HISTORY:  Rasheema is here today for repeat clinical assessment.  Dr. Lanny on 04/25/2024.  She is currently taking ivosidenib every 3 months due to diffuse bone metastases.  She had noticed development of petechia on her right calf. She states that these spots were spread over a 4 inch area.  They have gradually started to fade.  only 2 spots remaining.  They are not itchy.  1 spot there is slightly raised from surrounding skin.  She also has itching and swelling along the top of both feet and along the ankles.  She does have some baseline swelling.  Itching is new.  Uses cortisone cream when needed and that has been helpful.  She states she had an extremely negative events with most recent thoracentesis.  She felt very anxious going into it as she previously experienced a zinger when provider hit a nerve during thoracentesis.  Caused a great deal of pain.  She is concerned that she may not be able to go through another 1 of these procedures without help for anxiety or some sort of sedation.  She denies fevers or chills. She denies unusual pain. Her appetite is good. Her weight has been stable.  I have reviewed the past medical history, past surgical history, social history and family history with the patient and they are unchanged from  previous note.  ALLERGIES:  is allergic to morphine, metoprolol , nebivolol , and morphine and codeine.  MEDICATIONS:  Current Outpatient Medications  Medication Sig Dispense Refill   diazepam  (VALIUM ) 10 MG tablet Take 1.5 to 1 tablet po 2 - 3 hours prior to procedure. May repeat dose in 1 and 1.5 hours prn anxiety 10 tablet 0   diltiazem  (CARDIZEM  CD) 300 MG 24 hr capsule TAKE 1 CAPSULE EVERY DAY 90 capsule 2   ezetimibe  (ZETIA ) 10 MG tablet Take 1 tablet (10 mg total) by mouth daily. 90 tablet 3   omeprazole  (PRILOSEC) 20 MG capsule Take 20 mg by mouth daily.     ondansetron  (ZOFRAN ) 8 MG tablet Take 1 tablet (8 mg total) by mouth every 8 (eight) hours as needed for nausea or vomiting. 20 tablet 2   Prenatal Vit-Fe Fumarate-FA (PRENATAL VITAMIN PO) Take by mouth daily. Prenatal w/folic acid po daily     Probiotic Product (ADVANCED PROBIOTIC PO) Take 1 capsule by mouth daily.     rosuvastatin  (CRESTOR ) 20 MG tablet Take 1 tablet (20 mg total) by mouth daily. 90 tablet 3   anastrozole  (ARIMIDEX ) 1 MG tablet Take 1 tablet (1 mg total) by mouth daily. (Patient not taking: Reported on 04/25/2024) 90 tablet 1   methylPREDNISolone  (MEDROL  DOSEPAK) 4 MG TBPK tablet Take once daily after breakfast, start at 6 tabs on first day then decrease 1 tab a day until finish. (Patient not taking: Reported on 04/25/2024) 21 tablet 0   palbociclib  (IBRANCE ) 75 MG tablet Take 1 tablet (75 mg total) by mouth daily. Take for 14 days on, 7 days off, repeat every 21 days. (Patient not taking: Reported on 04/25/2024) 21 tablet 0   No current facility-administered medications for this visit.    HISTORY OF PRESENT ILLNESS:   Oncology History Overview Note   Cancer Staging  Malignant neoplasm of overlapping sites of left breast in female, estrogen receptor positive (HCC) Staging form: Breast, AJCC 8th Edition - Clinical stage from 08/11/2021: Stage IA (cT1c, cN0, cM0, G2, ER+, PR+, HER2-) - Signed by [name], MD on  08/18/2021     Malignant neoplasm of overlapping sites of left breast in female, estrogen receptor positive (HCC)  07/27/2021 Mammogram   EXAM: DIGITAL DIAGNOSTIC UNILATERAL LEFT MAMMOGRAM WITH TOMOSYNTHESIS AND CAD; ULTRASOUND LEFT BREAST LIMITED  IMPRESSION: 1. There is a suspicious mass in the left breast at 9 o'clock measuring 1.8 cm.   2. There is a 1.0 cm group of suspicious linear calcifications anterior to the suspicious mass in the left breast, as well as a faint 2 mm group which lie 1 cm lateral to the linear calcifications.   3.  No evidence of left axillary lymphadenopathy   08/11/2021 Cancer Staging   Staging form: Breast, AJCC 8th  Edition - Clinical stage from 08/11/2021: Stage IA (cT1c, cN0, cM0, G2, ER+, PR+, HER2-) - Signed by [name], MD on 08/18/2021 Stage prefix: Initial diagnosis Histologic grading system: 3 grade system   08/11/2021 Initial Biopsy   Diagnosis 1. Breast, left, needle core biopsy, 9 o'clock, ribbon clip - INVASIVE MAMMARY CARCINOMA - SEE COMMENT 2. Breast, left, needle core biopsy, lower inner quadrant, x clip - INVASIVE MAMMARY CARCINOMA - MAMMARY CARCINOMA IN-SITU - CALCIFICATIONS - SEE COMMENT Microscopic Comment 1. The biopsy material shows an infiltrative proliferation of cells with arranged linearly and in small clusters. Based on the biopsy, the carcinoma appears Nottingham grade 2 of 3 and measures 1.2 cm in greatest linear extent. 2. Based on the biopsy, the carcinoma appears Nottingham grade 2 of 3 and measures 0.2 cm in greatest linear extent.  1. PROGNOSTIC INDICATORS Results: The tumor cells are NEGATIVE for Her2 (1+). Estrogen Receptor: 100%, POSITIVE, STRONG STAINING INTENSITY Progesterone Receptor: 60%, POSITIVE, STRONG STAINING INTENSITY Proliferation Marker Ki67: 10%  2. PROGNOSTIC INDICATORS Results: The tumor cells are NEGATIVE for Her2 (1+). Estrogen Receptor: 100%, POSITIVE, STRONG STAINING  INTENSITY Progesterone Receptor: 60%, POSITIVE, STRONG STAINING INTENSITY Proliferation Marker Ki67: 15%   08/13/2021 Mammogram   EXAM: DIGITAL DIAGNOSTIC UNILATERAL RIGHT MAMMOGRAM WITH TOMOSYNTHESIS AND CAD  IMPRESSION: No mammographic evidence for malignancy.   08/16/2021 Initial Diagnosis   Malignant neoplasm of overlapping sites of left breast in female, estrogen receptor positive (HCC)   08/25/2021 Imaging   EXAM: BILATERAL BREAST MRI WITH AND WITHOUT CONTRAST  IMPRESSION: 1. Biopsy-proven invasive ductal carcinoma measuring approximately 2.8 x 1.7 x 1.5 cm in the inner breast at middle depth, associated with the ribbon shaped tissue marking clip placed at the time of core needle biopsy. Enhancement extends approximately 1.4 cm posterior to the clip. 2. Approximate 1.8 cm post biopsy hematoma with associated rim enhancement at the site of the biopsy-proven invasive ductal carcinoma and DCIS in the LOWER INNER QUADRANT at anterior depth associated with the X shaped tissue marking clip. 3. In combination, the overall enhancement spans approximately 4 cm. 4. No MRI evidence of malignancy involving the RIGHT breast. 5. No pathologic lymphadenopathy.   08/27/2021 Genetic Testing   egative hereditary cancer genetic testing: no pathogenic variants detected in [facility] BRCAPlus Panel and CancerNext-Expanded +RNAinsight Panel.  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  The report dates are 08/27/2021 and 08/30/2021.   The BRCAplus panel offered by [employer] and includes sequencing and deletion/duplication analysis for the following 8 genes: ATM, BRCA1, BRCA2, CDH1, CHEK2, PALB2, PTEN, and TP53.  The CancerNext-Expanded gene panel offered by [facility] and includes sequencing, rearrangement, and RNA analysis for the following 77 genes: AIP, ALK, APC, ATM, AXIN2, BAP1, BARD1, BLM, BMPR1A, BRCA1, BRCA2, BRIP1, CDC73, CDH1, CDK4, CDKN1B, CDKN2A, CHEK2,  CTNNA1, DICER1, FANCC, FH, FLCN, GALNT12, KIF1B, LZTR1, MAX, MEN1, MET, MLH1, MSH2, MSH3, MSH6, MUTYH, NBN, NF1, NF2, NTHL1, PALB2, PHOX2B, PMS2, POT1, PRKAR1A, PTCH1, PTEN, RAD51C, RAD51D, RB1, RECQL, RET, SDHA, SDHAF2, SDHB, SDHC, SDHD, SMAD4, SMARCA4, SMARCB1, SMARCE1, STK11, SUFU, TMEM127, TP53, TSC1, TSC2, VHL and XRCC2 (sequencing and deletion/duplication); EGFR, EGLN1, HOXB13, KIT, MITF, PDGFRA, POLD1, and POLE (sequencing only); EPCAM and GREM1 (deletion/duplication only).    09/09/2021 Definitive Surgery   FINAL MICROSCOPIC DIAGNOSIS:   A. BREAST, LEFT, LUMPECTOMY:  -  Invasive ductal carcinoma, Nottingham grade 2 of 3, 3.5 cm  -  Ductal carcinoma in-situ, intermediate grade  -  Margins uninvolved by carcinoma (<0.1  cm; anterior)  -  Previous biopsy site changes present    09/09/2021 Oncotype testing   Oncotype DX was obtained on the final surgical sample and the recurrence score of 21 predicts a risk of recurrence outside the breast over the next 9 years of 7%, if the patient's only systemic therapy is an antiestrogen for 5 years.  It also predicts no benefit from chemotherapy.   10/20/2021 - 11/16/2021 Radiation Therapy   Site Technique Total Dose (Gy) Dose per Fx (Gy) Completed Fx Beam Energies  Breast, Left: Breast_L_axilla 3D 40.05/40.05 2.67 15/15 10X  Breast, Left: Breast_L_Bst 3D 10/10 2 5/5 6X, 10X     12/06/2021 - 04/2022 Anti-estrogen oral therapy   Tamoxifen  x 5 years unable to tolerate, attempted anastrozole  and had similar side effects.  Opted to forego adjuvant antiestrogen therapy       REVIEW OF SYSTEMS:   Constitutional: Denies fevers, chills or abnormal weight loss Eyes: Denies blurriness of vision Ears, nose, mouth, throat, and face: Denies mucositis or sore throat Respiratory: Denies cough, dyspnea or wheezes Cardiovascular: Denies palpitation or chest discomfort. She does have mild lower extremity swelling across the tops of her feet.  Gastrointestinal:   Denies nausea, heartburn or change in bowel habits Skin: Denies abnormal skin rashes. Itchy skin on the feet and ankles. No rash noted. Two small areas of petechiae on the right calf.  Lymphatics: Denies new lymphadenopathy or easy bruising Neurological:Denies numbness, tingling or new weaknesses Behavioral/Psych: Mood is stable, no new changes  All other systems were reviewed with the patient and are negative.   VITALS:   Today's Vitals   05/17/24 1100 05/17/24 1119  BP: (!) 170/70 (!) 150/72  Pulse: 80   Resp: 18   Temp: 98.5 F (36.9 C)   TempSrc: Temporal   SpO2: 95%   Weight: 147 lb 1.6 oz (66.7 kg)   Height: 5' 2 (1.575 m)   PainSc: 0-No pain    Body mass index is 26.9 kg/m.   Wt Readings from Last 3 Encounters:  05/17/24 147 lb 1.6 oz (66.7 kg)  04/25/24 144 lb 11.2 oz (65.6 kg)  04/10/24 139 lb (63 kg)    Body mass index is 26.9 kg/m.  Performance status (ECOG): 1 - Symptomatic but completely ambulatory  PHYSICAL EXAM:   GENERAL:alert, no distress and comfortable SKIN: skin color, texture, turgor are normal, no rashes or significant lesions. Two areas of small, purplish lesions noted on the right calf. Each area has 1 purplish lesion in the center surrounded by several pinpoint sized purpleish lesions.  The more posterior lesion is slightly elevated from surrounding skin.  Skin is intact.  No drainage present.  Nontender to palpate.  No redness noted.  No warmth associated with lesions. EYES: normal, Conjunctiva are pink and non-injected, sclera clear OROPHARYNX:no exudate, no erythema and lips, buccal mucosa, and tongue normal  NECK: supple, thyroid  normal size, non-tender, without nodularity LYMPH:  no palpable lymphadenopathy in the cervical, axillary or inguinal LUNGS: clear to auscultation and percussion with normal breathing effort HEART: regular rate & rhythm and no murmurs.  There is very mild nonpitting edema in bilateral feet. ABDOMEN:abdomen soft,  non-tender and normal bowel sounds Musculoskeletal:no cyanosis of digits and no clubbing  NEURO: alert & oriented x 3 with fluent speech, no focal motor/sensory deficits  LABORATORY DATA:  I have reviewed the data as listed    Component Value Date/Time   NA 138 05/17/2024 1026   NA 141 01/19/2024 1036  K 3.9 05/17/2024 1026   CL 105 05/17/2024 1026   CO2 26 05/17/2024 1026   GLUCOSE 107 (H) 05/17/2024 1026   BUN 18 05/17/2024 1026   BUN 12 01/19/2024 1036   CREATININE 0.84 05/17/2024 1026   CREATININE 1.01 (H) 07/28/2020 0901   CALCIUM  9.7 05/17/2024 1026   PROT 6.5 05/17/2024 1026   PROT 7.3 05/25/2023 1117   ALBUMIN 4.3 05/17/2024 1026   ALBUMIN 4.5 05/25/2023 1117   AST 39 05/17/2024 1026   ALT 54 (H) 05/17/2024 1026   ALKPHOS 69 05/17/2024 1026   BILITOT 1.0 05/17/2024 1026   GFRNONAA >60 05/17/2024 1026   GFRNONAA 54 (L) 07/28/2020 0901   GFRAA 63 07/28/2020 0901    Lab Results  Component Value Date   WBC 6.2 05/17/2024   NEUTROABS 4.4 05/17/2024   HGB 13.4 05/17/2024   HCT 38.5 05/17/2024   MCV 97.7 05/17/2024   PLT 225 05/17/2024    RADIOGRAPHIC STUDIES: DG Chest 1 View Result Date: 05/09/2024 CLINICAL DATA:  Status post thoracentesis. EXAM: CHEST  1 VIEW COMPARISON:  04/12/2024. FINDINGS: Decreased left pleural effusion with improved aeration at the left lung base and suspected trace residual left pleural effusion. No pneumothorax. No focal consolidation. Cardiomediastinal contours within limits. No acute osseous abnormality. IMPRESSION: Decreased left pleural effusion with improved aeration at the left lung base. Suspected trace residual left pleural effusion. No pneumothorax. Electronically Signed   By: Harrietta Sherry M.D.   On: 05/09/2024 12:22   IR THORACENTESIS ASP PLEURAL SPACE W/IMG GUIDE Result Date: 05/09/2024 INDICATION: Patient with history of breast cancer with recurrent malignant left pleural effusion. Request received for therapeutic left  thoracentesis. EXAM: ULTRASOUND GUIDED THERAPEUTIC LEFT THORACENTESIS MEDICATIONS: 8 mL 1% lidocaine  with epinephrine  to skin/subcutaneous tissue COMPLICATIONS: None immediate. PROCEDURE: An ultrasound guided thoracentesis was thoroughly discussed with the patient and questions answered. The benefits, risks, alternatives and complications were also discussed. The patient understands and wishes to proceed with the procedure. Written consent was obtained. Ultrasound was performed to localize and mark an adequate pocket of fluid in the left chest. The area was then prepped and draped in the normal sterile fashion. 1% Lidocaine  was used for local anesthesia. Under ultrasound guidance a 6 Fr Safe-T-Centesis catheter was introduced. Thoracentesis was performed. The catheter was removed and a dressing applied. FINDINGS: A total of approximately 650 cc of yellow fluid was removed. IMPRESSION: Successful ultrasound guided therapeutic left thoracentesis yielding 650 cc of pleural fluid. Performed by: Franky Rakers PA-C Electronically Signed   By: Ester Sides M.D.   On: 05/09/2024 12:14

## 2024-05-17 ENCOUNTER — Encounter: Payer: Self-pay | Admitting: Nurse Practitioner

## 2024-05-17 ENCOUNTER — Inpatient Hospital Stay (HOSPITAL_BASED_OUTPATIENT_CLINIC_OR_DEPARTMENT_OTHER): Admitting: Nurse Practitioner

## 2024-05-17 ENCOUNTER — Inpatient Hospital Stay: Attending: Hematology

## 2024-05-17 VITALS — BP 150/72 | HR 80 | Temp 98.5°F | Resp 18 | Ht 62.0 in | Wt 147.1 lb

## 2024-05-17 DIAGNOSIS — R233 Spontaneous ecchymoses: Secondary | ICD-10-CM | POA: Diagnosis not present

## 2024-05-17 DIAGNOSIS — D519 Vitamin B12 deficiency anemia, unspecified: Secondary | ICD-10-CM | POA: Diagnosis not present

## 2024-05-17 DIAGNOSIS — F419 Anxiety disorder, unspecified: Secondary | ICD-10-CM | POA: Diagnosis not present

## 2024-05-17 DIAGNOSIS — Z1721 Progesterone receptor positive status: Secondary | ICD-10-CM | POA: Insufficient documentation

## 2024-05-17 DIAGNOSIS — Z923 Personal history of irradiation: Secondary | ICD-10-CM | POA: Insufficient documentation

## 2024-05-17 DIAGNOSIS — M8589 Other specified disorders of bone density and structure, multiple sites: Secondary | ICD-10-CM | POA: Insufficient documentation

## 2024-05-17 DIAGNOSIS — M7989 Other specified soft tissue disorders: Secondary | ICD-10-CM | POA: Insufficient documentation

## 2024-05-17 DIAGNOSIS — C7951 Secondary malignant neoplasm of bone: Secondary | ICD-10-CM | POA: Diagnosis not present

## 2024-05-17 DIAGNOSIS — C50812 Malignant neoplasm of overlapping sites of left female breast: Secondary | ICD-10-CM

## 2024-05-17 DIAGNOSIS — Z800 Family history of malignant neoplasm of digestive organs: Secondary | ICD-10-CM | POA: Diagnosis not present

## 2024-05-17 DIAGNOSIS — Z170 Estrogen receptor positive status [ER+]: Secondary | ICD-10-CM | POA: Diagnosis not present

## 2024-05-17 DIAGNOSIS — Z803 Family history of malignant neoplasm of breast: Secondary | ICD-10-CM | POA: Insufficient documentation

## 2024-05-17 DIAGNOSIS — Z1732 Human epidermal growth factor receptor 2 negative status: Secondary | ICD-10-CM | POA: Diagnosis not present

## 2024-05-17 DIAGNOSIS — I10 Essential (primary) hypertension: Secondary | ICD-10-CM | POA: Insufficient documentation

## 2024-05-17 DIAGNOSIS — C792 Secondary malignant neoplasm of skin: Secondary | ICD-10-CM | POA: Diagnosis not present

## 2024-05-17 DIAGNOSIS — J910 Malignant pleural effusion: Secondary | ICD-10-CM | POA: Diagnosis not present

## 2024-05-17 LAB — CBC WITH DIFFERENTIAL (CANCER CENTER ONLY)
Abs Immature Granulocytes: 0.01 K/uL (ref 0.00–0.07)
Basophils Absolute: 0 K/uL (ref 0.0–0.1)
Basophils Relative: 0 %
Eosinophils Absolute: 0 K/uL (ref 0.0–0.5)
Eosinophils Relative: 1 %
HCT: 38.5 % (ref 36.0–46.0)
Hemoglobin: 13.4 g/dL (ref 12.0–15.0)
Immature Granulocytes: 0 %
Lymphocytes Relative: 19 %
Lymphs Abs: 1.2 K/uL (ref 0.7–4.0)
MCH: 34 pg (ref 26.0–34.0)
MCHC: 34.8 g/dL (ref 30.0–36.0)
MCV: 97.7 fL (ref 80.0–100.0)
Monocytes Absolute: 0.5 K/uL (ref 0.1–1.0)
Monocytes Relative: 9 %
Neutro Abs: 4.4 K/uL (ref 1.7–7.7)
Neutrophils Relative %: 71 %
Platelet Count: 225 K/uL (ref 150–400)
RBC: 3.94 MIL/uL (ref 3.87–5.11)
RDW: 13.6 % (ref 11.5–15.5)
WBC Count: 6.2 K/uL (ref 4.0–10.5)
nRBC: 0 % (ref 0.0–0.2)

## 2024-05-17 LAB — CMP (CANCER CENTER ONLY)
ALT: 54 U/L — ABNORMAL HIGH (ref 0–44)
AST: 39 U/L (ref 15–41)
Albumin: 4.3 g/dL (ref 3.5–5.0)
Alkaline Phosphatase: 69 U/L (ref 38–126)
Anion gap: 7 (ref 5–15)
BUN: 18 mg/dL (ref 8–23)
CO2: 26 mmol/L (ref 22–32)
Calcium: 9.7 mg/dL (ref 8.9–10.3)
Chloride: 105 mmol/L (ref 98–111)
Creatinine: 0.84 mg/dL (ref 0.44–1.00)
GFR, Estimated: >60 mL/min (ref >60)
Glucose, Bld: 107 mg/dL — ABNORMAL HIGH (ref 70–99)
Potassium: 3.9 mmol/L (ref 3.5–5.1)
Sodium: 138 mmol/L (ref 135–145)
Total Bilirubin: 1 mg/dL (ref 0.0–1.2)
Total Protein: 6.5 g/dL (ref 6.5–8.1)

## 2024-05-17 LAB — IRON AND IRON BINDING CAPACITY (CC-WL,HP ONLY)
Iron: 121 ug/dL (ref 28–170)
Saturation Ratios: 34 % — ABNORMAL HIGH (ref 10.4–31.8)
TIBC: 358 ug/dL (ref 250–450)
UIBC: 237 ug/dL (ref 148–442)

## 2024-05-17 LAB — FERRITIN: Ferritin: 311 ng/mL — ABNORMAL HIGH (ref 11–307)

## 2024-05-17 LAB — MISCELLANEOUS TEST

## 2024-05-17 MED ORDER — DIAZEPAM 10 MG PO TABS: ORAL_TABLET | ORAL | 0 refills | Status: DC

## 2024-05-18 ENCOUNTER — Other Ambulatory Visit: Payer: Self-pay | Admitting: Family Medicine

## 2024-05-20 ENCOUNTER — Other Ambulatory Visit: Payer: Medicare Other

## 2024-05-27 ENCOUNTER — Inpatient Hospital Stay

## 2024-05-27 ENCOUNTER — Other Ambulatory Visit

## 2024-05-27 ENCOUNTER — Ambulatory Visit: Admitting: Nurse Practitioner

## 2024-05-27 VITALS — BP 161/61 | HR 66 | Temp 97.9°F | Resp 19 | Wt 147.2 lb

## 2024-05-27 DIAGNOSIS — C7951 Secondary malignant neoplasm of bone: Secondary | ICD-10-CM | POA: Diagnosis not present

## 2024-05-27 DIAGNOSIS — J910 Malignant pleural effusion: Secondary | ICD-10-CM | POA: Diagnosis not present

## 2024-05-27 DIAGNOSIS — M8580 Other specified disorders of bone density and structure, unspecified site: Secondary | ICD-10-CM

## 2024-05-27 DIAGNOSIS — C50812 Malignant neoplasm of overlapping sites of left female breast: Secondary | ICD-10-CM | POA: Diagnosis not present

## 2024-05-27 DIAGNOSIS — C792 Secondary malignant neoplasm of skin: Secondary | ICD-10-CM | POA: Diagnosis not present

## 2024-05-27 DIAGNOSIS — Z170 Estrogen receptor positive status [ER+]: Secondary | ICD-10-CM | POA: Diagnosis not present

## 2024-05-27 DIAGNOSIS — Z1721 Progesterone receptor positive status: Secondary | ICD-10-CM | POA: Diagnosis not present

## 2024-05-27 MED ORDER — ZOLEDRONIC ACID 4 MG/100ML IV SOLN
4.0000 mg | Freq: Once | INTRAVENOUS | Status: AC
  Administered 2024-05-27: 4 mg via INTRAVENOUS
  Filled 2024-05-27: qty 100

## 2024-05-27 MED ORDER — SODIUM CHLORIDE 0.9 % IV SOLN: INTRAVENOUS | Status: DC

## 2024-05-27 MED ORDER — CYANOCOBALAMIN 1000 MCG/ML IJ SOLN
1000.0000 ug | Freq: Once | INTRAMUSCULAR | Status: AC
  Administered 2024-05-27: 1000 ug via INTRAMUSCULAR
  Filled 2024-05-27: qty 1

## 2024-05-28 ENCOUNTER — Encounter: Payer: Self-pay | Admitting: Hematology

## 2024-05-28 ENCOUNTER — Other Ambulatory Visit: Payer: Self-pay

## 2024-05-30 LAB — GUARDANT 360

## 2024-06-06 NOTE — Assessment & Plan Note (Signed)
 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 Biopsy 08/11/21 confirmed invasive mammary carcinoma. -left lumpectomy on 09/09/21 by Dr. 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 No nodes were removed due to her age. -she received adjuvant RT, completed in 11/2021 -she started adjuvant tamoxifen  in 12/2021, but could not tolerate. She subsequently tried anastrozole  and still can not tolerate. She stopped in 04/2022 -Screening mammogram in May 2024 were negative. - She developed pleural effusion in April 2025, cytology unfortunately showed malignant cells, consistent with breast primary. ERand PR moderately positive, Her2 negative (IHC 0) -FDG PET 01/08/2024 was negative for hypermetabolic lesion or hypermetabolism around the pleura.  Cerianna  PET scan 01/26/2024 showed diffuse bone mets and mediastinal adenopathy. Pleura was negative on PET -her skin (scalp) biopsy also showed metastatic breast cancer, ER and PR positive, HER2 negative.   -She has started anastrozole  in 12/2023  - Due to her recurrent pleural effusion, and high disease burden, I will add a low-dose Ibrance  for better disease control - Continue Zometa , will change to every 3 months due to her diffuse bone metastasis. - Due to severe joint pain especially in her knees, she stopped her anastrozole  and Ibrance  in early July.

## 2024-06-07 ENCOUNTER — Other Ambulatory Visit (HOSPITAL_COMMUNITY): Payer: Self-pay | Admitting: Urology

## 2024-06-07 ENCOUNTER — Ambulatory Visit (HOSPITAL_COMMUNITY)
Admission: RE | Admit: 2024-06-07 | Discharge: 2024-06-07 | Disposition: A | Discharge status: Home / Self Care | Source: Ambulatory Visit | Attending: Urology | Admitting: Urology

## 2024-06-07 ENCOUNTER — Ambulatory Visit (HOSPITAL_COMMUNITY)
Admission: RE | Admit: 2024-06-07 | Discharge: 2024-06-07 | Disposition: A | Discharge status: Home / Self Care | Source: Ambulatory Visit | Attending: Hematology | Admitting: Hematology

## 2024-06-07 DIAGNOSIS — C50812 Malignant neoplasm of overlapping sites of left female breast: Secondary | ICD-10-CM

## 2024-06-07 DIAGNOSIS — J90 Pleural effusion, not elsewhere classified: Secondary | ICD-10-CM

## 2024-06-07 DIAGNOSIS — C50912 Malignant neoplasm of unspecified site of left female breast: Secondary | ICD-10-CM | POA: Diagnosis not present

## 2024-06-07 HISTORY — PX: IR THORACENTESIS ASP PLEURAL SPACE W/IMG GUIDE: IMG5380

## 2024-06-07 MED ORDER — LIDOCAINE-EPINEPHRINE 1 %-1:100000 IJ SOLN
INTRAMUSCULAR | Status: AC
  Filled 2024-06-07: qty 1

## 2024-06-07 NOTE — Procedures (Signed)
 PROCEDURE SUMMARY:  Successful image-guided left-sided therapeutic thoracentesis. Yielded 0.60 liters of clear, straw colored pleural fluid. Patient tolerated procedure well. EBL: Zero No immediate complications.  Post procedure CXR shows no pneumothorax.  Please see imaging section of Epic for full dictation.  [facility] LABOR Kordae Buonocore PA-C 06/07/2024 1:23 PM

## 2024-06-10 ENCOUNTER — Ambulatory Visit (HOSPITAL_BASED_OUTPATIENT_CLINIC_OR_DEPARTMENT_OTHER)
Admission: RE | Admit: 2024-06-10 | Discharge: 2024-06-10 | Disposition: A | Discharge status: Home / Self Care | Source: Ambulatory Visit | Attending: Hematology | Admitting: Hematology

## 2024-06-10 DIAGNOSIS — M85852 Other specified disorders of bone density and structure, left thigh: Secondary | ICD-10-CM | POA: Diagnosis not present

## 2024-06-10 DIAGNOSIS — Z780 Asymptomatic menopausal state: Secondary | ICD-10-CM | POA: Diagnosis not present

## 2024-06-10 DIAGNOSIS — E2839 Other primary ovarian failure: Secondary | ICD-10-CM | POA: Insufficient documentation

## 2024-06-10 DIAGNOSIS — M85851 Other specified disorders of bone density and structure, right thigh: Secondary | ICD-10-CM | POA: Diagnosis not present

## 2024-06-11 ENCOUNTER — Other Ambulatory Visit: Payer: Self-pay

## 2024-06-11 ENCOUNTER — Inpatient Hospital Stay

## 2024-06-11 ENCOUNTER — Inpatient Hospital Stay: Admitting: Hematology

## 2024-06-11 VITALS — BP 150/70 | HR 70 | Temp 98.0°F | Resp 16 | Ht 62.0 in | Wt 151.7 lb

## 2024-06-11 DIAGNOSIS — C50812 Malignant neoplasm of overlapping sites of left female breast: Secondary | ICD-10-CM | POA: Diagnosis not present

## 2024-06-11 DIAGNOSIS — C792 Secondary malignant neoplasm of skin: Secondary | ICD-10-CM | POA: Diagnosis not present

## 2024-06-11 DIAGNOSIS — Z170 Estrogen receptor positive status [ER+]: Secondary | ICD-10-CM

## 2024-06-11 DIAGNOSIS — D519 Vitamin B12 deficiency anemia, unspecified: Secondary | ICD-10-CM

## 2024-06-11 DIAGNOSIS — Z1721 Progesterone receptor positive status: Secondary | ICD-10-CM | POA: Diagnosis not present

## 2024-06-11 DIAGNOSIS — J910 Malignant pleural effusion: Secondary | ICD-10-CM | POA: Diagnosis not present

## 2024-06-11 DIAGNOSIS — C7951 Secondary malignant neoplasm of bone: Secondary | ICD-10-CM | POA: Diagnosis not present

## 2024-06-11 LAB — COMPREHENSIVE METABOLIC PANEL WITH GFR
ALT: 26 U/L (ref 0–44)
AST: 22 U/L (ref 15–41)
Albumin: 4.3 g/dL (ref 3.5–5.0)
Alkaline Phosphatase: 63 U/L (ref 38–126)
Anion gap: 7 (ref 5–15)
BUN: 15 mg/dL (ref 8–23)
CO2: 24 mmol/L (ref 22–32)
Calcium: 9.4 mg/dL (ref 8.9–10.3)
Chloride: 108 mmol/L (ref 98–111)
Creatinine, Ser: 1.02 mg/dL — ABNORMAL HIGH (ref 0.44–1.00)
GFR, Estimated: 56 mL/min — ABNORMAL LOW (ref >60)
Glucose, Bld: 94 mg/dL (ref 70–99)
Potassium: 3.9 mmol/L (ref 3.5–5.1)
Sodium: 139 mmol/L (ref 135–145)
Total Bilirubin: 0.7 mg/dL (ref 0.0–1.2)
Total Protein: 6.5 g/dL (ref 6.5–8.1)

## 2024-06-11 LAB — CBC WITH DIFFERENTIAL/PLATELET
Abs Immature Granulocytes: 0.01 K/uL (ref 0.00–0.07)
Basophils Absolute: 0 K/uL (ref 0.0–0.1)
Basophils Relative: 1 %
Eosinophils Absolute: 0 K/uL (ref 0.0–0.5)
Eosinophils Relative: 1 %
HCT: 39 % (ref 36.0–46.0)
Hemoglobin: 13.6 g/dL (ref 12.0–15.0)
Immature Granulocytes: 0 %
Lymphocytes Relative: 28 %
Lymphs Abs: 1.2 K/uL (ref 0.7–4.0)
MCH: 33.4 pg (ref 26.0–34.0)
MCHC: 34.9 g/dL (ref 30.0–36.0)
MCV: 95.8 fL (ref 80.0–100.0)
Monocytes Absolute: 0.3 K/uL (ref 0.1–1.0)
Monocytes Relative: 8 %
Neutro Abs: 2.7 K/uL (ref 1.7–7.7)
Neutrophils Relative %: 62 %
Platelets: 221 K/uL (ref 150–400)
RBC: 4.07 MIL/uL (ref 3.87–5.11)
RDW: 12.9 % (ref 11.5–15.5)
WBC: 4.3 K/uL (ref 4.0–10.5)
nRBC: 0 % (ref 0.0–0.2)

## 2024-06-11 MED ORDER — DIAZEPAM 5 MG PO TABS: 5.0000 mg | ORAL_TABLET | ORAL | 0 refills | Status: AC | PRN

## 2024-06-11 NOTE — Progress Notes (Signed)
 [facility]   [phone] [phone]   Clinic Follow up Note   Patient Care Team: [name], MD as PCP - General (Family Medicine) [name], MD as PCP - Cardiology (Cardiology) [name], MD as Consulting Physician (Cardiology) [name], MD as Consulting Physician (Ophthalmology) [name], PA-C (Inactive) as Physician Assistant (Dermatology) [name], MD as Consulting Physician (General Surgery) [name], MD as Consulting Physician (Hematology) [name], MD as Attending Physician (Radiation Oncology) [name], [facility] (Inactive) (Pharmacist)  Date of Service:  06/11/2024  CHIEF COMPLAINT: f/u of breast cancer  CURRENT THERAPY:  Supportive care  Oncology History   Malignant neoplasm of overlapping sites of left breast in female, estrogen receptor positive (HCC) Stage IA, pT2, cN0, ER+/PR+/HER2-, Grade 2, RS 21, 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 Biopsy 08/11/21 confirmed invasive mammary carcinoma. -left lumpectomy on 09/09/21 by Dr. 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 No nodes were removed due to her age. -she received adjuvant RT, completed in 11/2021 -she started adjuvant tamoxifen  in 12/2021, but could not tolerate. She subsequently tried anastrozole  and still can not tolerate. She stopped in 04/2022 -Screening mammogram in May 2024 were negative. - She developed pleural effusion in April 2025, cytology unfortunately showed malignant cells, consistent with breast primary. ERand PR moderately positive, Her2 negative (IHC 0) -FDG PET 01/08/2024 was negative for hypermetabolic lesion or hypermetabolism around the pleura.  Cerianna  PET scan 01/26/2024 showed diffuse bone mets and mediastinal adenopathy. Pleura was negative on PET -her skin (scalp) biopsy also  showed metastatic breast cancer, ER and PR positive, HER2 negative.   -She has started anastrozole  in 12/2023  - Due to her recurrent pleural effusion, and high disease burden, I will add a low-dose Ibrance  for better disease control - Continue Zometa , will change to every 3 months due to her diffuse bone metastasis. - Due to severe joint pain especially in her knees, she stopped her anastrozole  and Ibrance  in early July.  Assessment & Plan Metastatic breast cancer Metastatic breast cancer with scalp involvement, malignant pleural effusion, and bone metastases. Previous intolerance to medication has led to no current treatment. Guardian test revealed a mutation without an FDA-approved drug. The disease is not aggressive but poses a risk of progression. ESR1 mutation was not detected, suggesting potential efficacy of anti-estrogen therapy. - Order Tempus test results and review upon availability - Schedule PET scan in two months - Prescribe Valium  5 mg for procedural use - Consider Fulvestrant injection if symptoms worsen or disease progresses on next scan   Osteopenia of bilateral hips Osteopenia with T-scores of -1.3 in the left femur and -1.6 in the right femur. The condition is well-managed with Zemaira administered every three months. - Continue Zemaira every three months  Plan - I reviewed her lab results, including Guardant360, no targeted therapy to follow based on the test result.  Her Tempus is still pending. - We again discussed the other antiestrogen therapy, including fulvestrant injections, patient prefers to continue observation for now. - I ordered thoracentesis for symptom relief, she knows to call interventional radiology as needed to schedule. - Plan to see her back in 2 months with a repeated Cerianna  PET scan   SUMMARY OF ONCOLOGIC HISTORY: Oncology History Overview Note   Cancer Staging  Malignant neoplasm of overlapping sites of left breast in  female, estrogen  receptor positive (HCC) Staging form: Breast, AJCC 8th Edition - Clinical stage from 08/11/2021: Stage IA (cT1c, cN0, cM0, G2, ER+, PR+, HER2-) - Signed by [name], MD on 08/18/2021     Malignant neoplasm of overlapping sites of left breast in female, estrogen receptor positive (HCC)  07/27/2021 Mammogram   EXAM: DIGITAL DIAGNOSTIC UNILATERAL LEFT MAMMOGRAM WITH TOMOSYNTHESIS AND CAD; ULTRASOUND LEFT BREAST LIMITED  IMPRESSION: 1. There is a suspicious mass in the left breast at 9 o'clock measuring 1.8 cm.   2. There is a 1.0 cm group of suspicious linear calcifications anterior to the suspicious mass in the left breast, as well as a faint 2 mm group which lie 1 cm lateral to the linear calcifications.   3.  No evidence of left axillary lymphadenopathy   08/11/2021 Cancer Staging   Staging form: Breast, AJCC 8th Edition - Clinical stage from 08/11/2021: Stage IA (cT1c, cN0, cM0, G2, ER+, PR+, HER2-) - Signed by [name], MD on 08/18/2021 Stage prefix: Initial diagnosis Histologic grading system: 3 grade system   08/11/2021 Initial Biopsy   Diagnosis 1. Breast, left, needle core biopsy, 9 o'clock, ribbon clip - INVASIVE MAMMARY CARCINOMA - SEE COMMENT 2. Breast, left, needle core biopsy, lower inner quadrant, x clip - INVASIVE MAMMARY CARCINOMA - MAMMARY CARCINOMA IN-SITU - CALCIFICATIONS - SEE COMMENT Microscopic Comment 1. The biopsy material shows an infiltrative proliferation of cells with arranged linearly and in small clusters. Based on the biopsy, the carcinoma appears Nottingham grade 2 of 3 and measures 1.2 cm in greatest linear extent. 2. Based on the biopsy, the carcinoma appears Nottingham grade 2 of 3 and measures 0.2 cm in greatest linear extent.  1. PROGNOSTIC INDICATORS Results: The tumor cells are NEGATIVE for Her2 (1+). Estrogen Receptor: 100%, POSITIVE, STRONG STAINING INTENSITY Progesterone Receptor: 60%, POSITIVE, STRONG STAINING  INTENSITY Proliferation Marker Ki67: 10%  2. PROGNOSTIC INDICATORS Results: The tumor cells are NEGATIVE for Her2 (1+). Estrogen Receptor: 100%, POSITIVE, STRONG STAINING INTENSITY Progesterone Receptor: 60%, POSITIVE, STRONG STAINING INTENSITY Proliferation Marker Ki67: 15%   08/13/2021 Mammogram   EXAM: DIGITAL DIAGNOSTIC UNILATERAL RIGHT MAMMOGRAM WITH TOMOSYNTHESIS AND CAD  IMPRESSION: No mammographic evidence for malignancy.   08/16/2021 Initial Diagnosis   Malignant neoplasm of overlapping sites of left breast in female, estrogen receptor positive (HCC)   08/25/2021 Imaging   EXAM: BILATERAL BREAST MRI WITH AND WITHOUT CONTRAST  IMPRESSION: 1. Biopsy-proven invasive ductal carcinoma measuring approximately 2.8 x 1.7 x 1.5 cm in the inner breast at middle depth, associated with the ribbon shaped tissue marking clip placed at the time of core needle biopsy. Enhancement extends approximately 1.4 cm posterior to the clip. 2. Approximate 1.8 cm post biopsy hematoma with associated rim enhancement at the site of the biopsy-proven invasive ductal carcinoma and DCIS in the LOWER INNER QUADRANT at anterior depth associated with the X shaped tissue marking clip. 3. In combination, the overall enhancement spans approximately 4 cm. 4. No MRI evidence of malignancy involving the RIGHT breast. 5. No pathologic lymphadenopathy.   08/27/2021 Genetic Testing   egative hereditary cancer genetic testing: no pathogenic variants detected in [facility] BRCAPlus Panel and CancerNext-Expanded +RNAinsight Panel.  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  The report dates are 08/27/2021 and 08/30/2021.   The BRCAplus panel offered by [employer] and includes sequencing and deletion/duplication analysis for the following 8 genes: ATM, BRCA1, BRCA2, CDH1, CHEK2, PALB2, PTEN, and TP53.  The CancerNext-Expanded gene panel  offered by [employer] and includes sequencing,  rearrangement, and RNA analysis for the following 77 genes: AIP, ALK, APC, ATM, AXIN2, BAP1, BARD1, BLM, BMPR1A, BRCA1, BRCA2, BRIP1, CDC73, CDH1, CDK4, CDKN1B, CDKN2A, CHEK2, CTNNA1, DICER1, FANCC, FH, FLCN, GALNT12, KIF1B, LZTR1, MAX, MEN1, MET, MLH1, MSH2, MSH3, MSH6, MUTYH, NBN, NF1, NF2, NTHL1, PALB2, PHOX2B, PMS2, POT1, PRKAR1A, PTCH1, PTEN, RAD51C, RAD51D, RB1, RECQL, RET, SDHA, SDHAF2, SDHB, SDHC, SDHD, SMAD4, SMARCA4, SMARCB1, SMARCE1, STK11, SUFU, TMEM127, TP53, TSC1, TSC2, VHL and XRCC2 (sequencing and deletion/duplication); EGFR, EGLN1, HOXB13, KIT, MITF, PDGFRA, POLD1, and POLE (sequencing only); EPCAM and GREM1 (deletion/duplication only).    09/09/2021 Definitive Surgery   FINAL MICROSCOPIC DIAGNOSIS:   A. BREAST, LEFT, LUMPECTOMY:  -  Invasive ductal carcinoma, Nottingham grade 2 of 3, 3.5 cm  -  Ductal carcinoma in-situ, intermediate grade  -  Margins uninvolved by carcinoma (<0.1 cm; anterior)  -  Previous biopsy site changes present    09/09/2021 Oncotype testing   Oncotype DX was obtained on the final surgical sample and the recurrence score of 21 predicts a risk of recurrence outside the breast over the next 9 years of 7%, if the patient's only systemic therapy is an antiestrogen for 5 years.  It also predicts no benefit from chemotherapy.   10/20/2021 - 11/16/2021 Radiation Therapy   Site Technique Total Dose (Gy) Dose per Fx (Gy) Completed Fx Beam Energies  Breast, Left: Breast_L_axilla 3D 40.05/40.05 2.67 15/15 10X  Breast, Left: Breast_L_Bst 3D 10/10 2 5/5 6X, 10X     12/06/2021 - 04/2022 Anti-estrogen oral therapy   Tamoxifen  x 5 years unable to tolerate, attempted anastrozole  and had similar side effects.  Opted to forego adjuvant antiestrogen therapy      Discussed the use of AI scribe software for clinical note transcription with the patient, who gave verbal consent to proceed.  History of Present Illness Madison Clements is an 78 year old female with  metastatic breast cancer who presents for follow-up. She is accompanied by Alan, her caregiver.  She feels good overall and had fluid removed last week, which improved her breathing. No new symptoms since her last visit two months ago, including no cough or chest pain. She usually experiences a cough when fluid accumulates, but this resolves after the fluid is removed.  She is not currently taking any medication as previous medications exacerbated her symptoms. She had a Guardian blood test to check for mutations for targeted therapy and a recent bone density test. She is taking Zemaira every three months for bone health.  She stopped taking anastrozole  and Ibrance  in early July. She has experienced joint stiffness in the past, affecting her mobility, and still has some residual difficulty walking. She prefers to avoid medications that might worsen this issue. She uses Valium  for procedures, but the tablets disintegrate when cut, so she is considering a lower dose tablet.     All other systems were reviewed with the patient and are negative.  MEDICAL HISTORY:  Past Medical History:  Diagnosis Date   Allergy 1990   Morphine following surgery   Arthritis    generalized   Atypical mole 02/24/2015   RGHT LATERAL THIGH MODERATE   Atypical mole 01/20/2022   Left Breast (mild)   Atypical mole 01/20/2022   Mid Back (mild)   Atypical nevi 02/24/2015   RIGHT NECK MILD   Atypical nevi 08/25/2015   LEFT UPPER ARM MILD   Atypical nevi 08/25/2015   LEFT LATERAL FOREARM MILD/FREE  Atypical nevi 02/23/2016   LEFT THIGH MODERATE/FREE   Atypical nevi 02/23/2016   LEFT MEDIAL LEG MODERATE/FREE   Atypical nevi 02/23/2016   LEFT UPPER BACK MILD /FREE   Atypical nevi 08/23/2016   RIGHT ANT PROX THIGH MILD /FREE   Atypical nevi 07/25/2018   MID LOWER BACK MODERATE   Atypical nevi 01/22/2019   LEFT MID BACK MILD /FREE   Atypical nevi 01/22/2019   LEFT NECK MILD   Atypical nevi 08/06/2019    LEFT INNER BREAST MODERATE W/S   Atypical nevi 08/06/2019   LEFT OUTER SIDE MILD/FREE   Blood transfusion without reported diagnosis    hx of   Breast cancer (HCC) 2022   CAD (coronary artery disease), native coronary artery 02/07/2019   Minimal CAD at the time of cath in 2016 in [place]    Family history of breast cancer 08/19/2021   Family history of pancreatic cancer 08/19/2021   Fatty liver 05/21/2020   Gastric polyps 10/02/2018   EGD 03/2015; benign   GERD (gastroesophageal reflux disease) 10/02/2018   on meds   History of melanoma 10/02/2018   Abdomen 2012; 2013; s/p local excisions.    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 DISTAL LOWER LEG TREATED BY DR. GEORGENE   Sleep apnea    Sleep apnea 05/26/2020   uses CPAP   Tricuspid regurgitation    moderate by echo 04/2023    SURGICAL HISTORY: Past Surgical History:  Procedure Laterality Date   ABDOMINAL HYSTERECTOMY  1990   APPENDECTOMY  1962   BLADDER SUSPENSION  1990   BREAST LUMPECTOMY Left 08/2021   BREAST LUMPECTOMY WITH RADIOACTIVE SEED LOCALIZATION Left 09/09/2021   Procedure: LEFT BREAST LUMPECTOMY WITH RADIOACTIVE SEED LOCALIZATION X2;  Surgeon: [name], MD;  Location: [facility];  Service: General;  Laterality: Left;   COLONOSCOPY  2016   in [place]-hx of polyps   EYE SURGERY  2013 torn retina laser   IR THORACENTESIS ASP PLEURAL SPACE W/IMG GUIDE  02/07/2024   IR THORACENTESIS ASP PLEURAL SPACE W/IMG GUIDE  04/12/2024   IR THORACENTESIS ASP PLEURAL SPACE W/IMG GUIDE  05/09/2024   IR  THORACENTESIS ASP PLEURAL SPACE W/IMG GUIDE  06/07/2024   MENISCUS REPAIR Right 2001   right knee   RIGHT HEART CATH AND CORONARY ANGIOGRAPHY N/A 06/20/2023   Procedure: RIGHT HEART CATH AND CORONARY ANGIOGRAPHY;  Surgeon: Rolan Ezra RAMAN, MD;  Location[facility] INVASIVE CV LAB;  Service: Cardiovascular;  Laterality: N/A;   thumb surgery Left    TONSILLECTOMY AND ADENOIDECTOMY     TRANSESOPHAGEAL ECHOCARDIOGRAM (CATH LAB) N/A 01/04/2024   Procedure: TRANSESOPHAGEAL ECHOCARDIOGRAM;  Surgeon: Santo Stanly LABOR, MD;  Location: [facility] INVASIVE CV LAB;  Service: Cardiovascular;  Laterality: N/A;   tuabl ligation     TUBAL LIGATION  1975   WISDOM TOOTH EXTRACTION      I have reviewed the social history and family history with the patient and they are unchanged from previous note.  ALLERGIES:  is allergic to morphine, metoprolol , nebivolol , and morphine and  codeine.  MEDICATIONS:  Current Outpatient Medications  Medication Sig Dispense Refill   diazepam  (VALIUM ) 5 MG tablet Take 1 tablet (5 mg total) by mouth as needed for anxiety. Before any invasive procedures or scan 10 tablet 0   diltiazem  (CARDIZEM  CD) 300 MG 24 hr capsule TAKE 1 CAPSULE EVERY DAY 90 capsule 2   ezetimibe  (ZETIA ) 10 MG tablet Take 1 tablet (10 mg total) by mouth daily. 90 tablet 3   omeprazole  (PRILOSEC) 20 MG capsule TAKE 1 CAPSULE EVERY DAY 90 capsule 3   ondansetron  (ZOFRAN ) 8 MG tablet Take 1 tablet (8 mg total) by mouth every 8 (eight) hours as needed for nausea or vomiting. 20 tablet 2   Prenatal Vit-Fe Fumarate-FA (PRENATAL VITAMIN PO) Take by mouth daily. Prenatal w/folic acid po daily     Probiotic Product (ADVANCED PROBIOTIC PO) Take 1 capsule by mouth daily.     rosuvastatin  (CRESTOR ) 20 MG tablet Take 1 tablet (20 mg total) by mouth daily. 90 tablet 3   No current facility-administered medications for this visit.    PHYSICAL EXAMINATION: ECOG PERFORMANCE STATUS: 1 - Symptomatic but completely  ambulatory  Vitals:   06/11/24 0838 06/11/24 0846  BP: (!) 156/51 (!) 150/70  Pulse: 70   Resp: 16   Temp: 98 F (36.7 C)   SpO2: 99%    Wt Readings from Last 3 Encounters:  06/11/24 151 lb 11.2 oz (68.8 kg)  05/27/24 147 lb 4 oz (66.8 kg)  05/17/24 147 lb 1.6 oz (66.7 kg)     GENERAL:alert, no distress and comfortable SKIN: skin color, texture, turgor are normal, no rashes or significant lesions EYES: normal, Conjunctiva are pink and non-injected, sclera clear NECK: supple, thyroid  normal size, non-tender, without nodularity LYMPH:  no palpable lymphadenopathy in the cervical, axillary  LUNGS: clear to auscultation and percussion with normal breathing effort HEART: regular rate & rhythm and no murmurs and no lower extremity edema ABDOMEN:abdomen soft, non-tender and normal bowel sounds Musculoskeletal:no cyanosis of digits and no clubbing  NEURO: alert & oriented x 3 with fluent speech, no focal motor/sensory deficits  Physical Exam CHEST: Lungs clear to auscultation.  LABORATORY DATA:  I have reviewed the data as listed    Latest Ref Rng & Units 06/11/2024    8:13 AM 05/17/2024   10:26 AM 04/10/2024    5:43 PM  CBC  WBC 4.0 - 10.5 K/uL 4.3  6.2  6.2   Hemoglobin 12.0 - 15.0 g/dL 86.3  86.5  88.3   Hematocrit 36.0 - 46.0 % 39.0  38.5  35.1   Platelets 150 - 400 K/uL 221  225  230         Latest Ref Rng & Units 06/11/2024    8:13 AM 05/17/2024   10:26 AM 04/10/2024    5:43 PM  CMP  Glucose 70 - 99 mg/dL 94  892  899   BUN 8 - 23 mg/dL 15  18  14    Creatinine 0.44 - 1.00 mg/dL 8.97  9.15  9.13   Sodium 135 - 145 mmol/L 139  138  140   Potassium 3.5 - 5.1 mmol/L 3.9  3.9  3.4   Chloride 98 - 111 mmol/L 108  105  105   CO2 22 - 32 mmol/L 24  26  22    Calcium  8.9 - 10.3 mg/dL 9.4  9.7  9.5   Total Protein 6.5 - 8.1 g/dL 6.5  6.5    Total Bilirubin 0.0 - 1.2  mg/dL 0.7  1.0    Alkaline Phos 38 - 126 U/L 63  69    AST 15 - 41 U/L 22  39    ALT 0 - 44 U/L 26  54         RADIOGRAPHIC STUDIES: I have personally reviewed the radiological images as listed and agreed with the findings in the report. DG Bone Density Result Date: 06/10/2024 EXAM: DUAL X-RAY ABSORPTIOMETRY (DXA) FOR BONE MINERAL DENSITY 06/10/2024 12:13 pm CLINICAL DATA:  78 year old Female Postmenopausal. Screening Patient is or has been on bone building therapies. TECHNIQUE: An axial (e.g., hips, spine) and/or appendicular (e.g., radius) exam was performed, as appropriate, using GE Secretary/administrator at [employer]. Images are obtained for bone mineral density measurement and are not obtained for diagnostic purposes. [id number] Exclusions: L2-L3. COMPARISON:  None. New baseline. FINDINGS: Scan quality: Good. LUMBAR SPINE (L1, L4): BMD (in g/cm2): 1.391 T-score: 1.7 Z-score: 3.5 LEFT FEMORAL NECK: BMD (in g/cm2): 0.851 T-score: -1.3 Z-score: 0.8 LEFT TOTAL HIP: BMD (in g/cm2): 0.942 T-score: -0.5 Z-score: 1.5 RIGHT FEMORAL NECK: BMD (in g/cm2): 0.813 T-score: -1.6 Z-score: 0.5 RIGHT TOTAL HIP: BMD (in g/cm2): 0.895 T-score: -0.9 Z-score: 1.1 FRAX 10-YEAR PROBABILITY OF FRACTURE: FRAX not reported as the patient is receiving bone building therapy. IMPRESSION: Osteopenia based on BMD. Fracture risk is unknown due to history of bone building therapy. RECOMMENDATIONS: 1. All patients should optimize calcium  and vitamin D  intake. 2. Consider FDA-approved medical therapies in postmenopausal women and men aged 82 years and older, based on the following: - A hip or vertebral (clinical or morphometric) fracture - T-score less than or equal to -2.5 and secondary causes have been excluded. - Low bone mass (T-score between -1.0 and -2.5) and a 10-year probability of a hip fracture greater than or equal to 3% or a 10-year probability of a major osteoporosis-related fracture greater than or equal to 20% based on the [place] -adapted WHO algorithm. - Clinician judgment and/or patient preferences may  indicate treatment for people with 10-year fracture probabilities above or below these levels 3. Patients with diagnosis of osteoporosis or at high risk for fracture should have regular bone mineral density tests. For patients eligible for Medicare, routine testing is allowed once every 2 years. The testing frequency can be increased to one year for patients who have rapidly progressing disease, those who are receiving or discontinuing medical therapy to restore bone mass, or have additional risk factors. Electronically Signed   By: Harrietta Sherry M.D.   On: 06/10/2024 13:51      Orders Placed This Encounter  Procedures   US  THORACENTESIS ASP PLEURAL SPACE W/IMG GUIDE    Standing Status:   Future    Expected Date:   08/11/2024    Expiration Date:   06/11/2025    Are labs required for specimen collection?:   No    Reason for Exam (SYMPTOM  OR DIAGNOSIS REQUIRED):   sumptom relieve    Preferred imaging location?:   [place]   US  THORACENTESIS ASP PLEURAL SPACE W/IMG GUIDE    Standing Status:   Future    Expected Date:   07/11/2024    Expiration Date:   06/11/2025    Are labs required for specimen collection?:   No    Reason for Exam (SYMPTOM  OR DIAGNOSIS REQUIRED):   symptoms relieve    Preferred imaging location?:   [place]    Release to patient:   Immediate  NM PET (CERIANNA ) WHOLE BODY    Standing Status:   Future    Expected Date:   08/11/2024    Expiration Date:   06/11/2025    If indicated for the ordered procedure, I authorize the administration of a radiopharmaceutical per Radiology protocol:   Yes    Preferred imaging location?:   [facility]   All questions were answered. The patient knows to call the clinic with any problems, questions or concerns. No barriers to learning was detected. The total time spent in the appointment was 30 minutes, including review of chart and various tests results, discussions about plan of care and coordination of care plan     Onita Mattock,  MD 06/11/2024

## 2024-06-12 ENCOUNTER — Telehealth: Payer: Self-pay | Admitting: Hematology

## 2024-06-12 NOTE — Telephone Encounter (Signed)
 Madison Clements has been scheduled and contacted with her 2 month follow up appointments

## 2024-06-25 ENCOUNTER — Encounter: Payer: Self-pay | Admitting: Hematology

## 2024-06-25 ENCOUNTER — Encounter: Payer: Self-pay | Admitting: Internal Medicine

## 2024-06-27 ENCOUNTER — Other Ambulatory Visit: Payer: Self-pay

## 2024-06-27 ENCOUNTER — Ambulatory Visit (HOSPITAL_COMMUNITY)
Admission: RE | Admit: 2024-06-27 | Discharge: 2024-06-27 | Disposition: A | Discharge status: Home / Self Care | Source: Ambulatory Visit | Attending: Emergency Medicine | Admitting: Emergency Medicine

## 2024-06-27 ENCOUNTER — Encounter (HOSPITAL_COMMUNITY): Payer: Self-pay

## 2024-06-27 VITALS — BP 184/80 | HR 62 | Temp 97.9°F | Resp 20

## 2024-06-27 DIAGNOSIS — R350 Frequency of micturition: Secondary | ICD-10-CM | POA: Insufficient documentation

## 2024-06-27 DIAGNOSIS — N3001 Acute cystitis with hematuria: Secondary | ICD-10-CM | POA: Diagnosis not present

## 2024-06-27 LAB — POCT URINALYSIS DIP (MANUAL ENTRY)
Bilirubin, UA: NEGATIVE
Glucose, UA: 100 mg/dL — AB
Ketones, POC UA: NEGATIVE mg/dL
Nitrite, UA: POSITIVE — AB
Protein Ur, POC: 30 mg/dL — AB
Spec Grav, UA: <=1.005 — AB (ref 1.010–1.025)
Urobilinogen, UA: 1 U/dL
pH, UA: 5.5 (ref 5.0–8.0)

## 2024-06-27 MED ORDER — CEPHALEXIN 500 MG PO CAPS: 500.0000 mg | ORAL_CAPSULE | Freq: Two times a day (BID) | ORAL | 0 refills | Status: AC

## 2024-06-27 NOTE — ED Provider Notes (Signed)
 [facility]    CSN: [id number] Arrival date & time: 06/27/24  1127      History   Chief Complaint Chief Complaint  Patient presents with   Urinary Frequency    Tested positive for UTI using home test kit. - Entered by patient   Dysuria    HPI Madison Clements is a 78 y.o. female.   Patient presents with dysuria, urinary frequency, and urinary urgency that began yesterday.  Patient states that she used an at home UTI test and it revealed that she had a urinary tract infection.  Patient reports that she has been taking over-the-counter Azo with minimal relief of her symptoms.  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  Patient's blood pressure is elevated in clinic today, patient reports that she has been taking her blood pressure medication as prescribed however she has very anxious today and believes this is likely related to her blood pressure.  The history is provided by the patient and medical records.  Urinary Frequency  Dysuria   Past Medical History:  Diagnosis Date   Allergy 1990   Morphine following surgery   Arthritis    generalized   Atypical mole 02/24/2015   RGHT LATERAL THIGH MODERATE   Atypical mole 01/20/2022   Left Breast (mild)   Atypical mole 01/20/2022   Mid Back (mild)   Atypical nevi 02/24/2015   RIGHT NECK MILD   Atypical nevi 08/25/2015   LEFT UPPER ARM MILD   Atypical nevi 08/25/2015   LEFT LATERAL FOREARM MILD/FREE   Atypical nevi 02/23/2016   LEFT THIGH MODERATE/FREE   Atypical nevi 02/23/2016   LEFT MEDIAL LEG MODERATE/FREE   Atypical nevi 02/23/2016   LEFT UPPER BACK MILD /FREE   Atypical nevi 08/23/2016   RIGHT ANT PROX THIGH MILD /FREE   Atypical nevi 07/25/2018   MID LOWER BACK MODERATE   Atypical nevi 01/22/2019   LEFT MID BACK MILD /FREE   Atypical nevi 01/22/2019   LEFT NECK MILD   Atypical nevi 08/06/2019   LEFT INNER BREAST MODERATE W/S    Atypical nevi 08/06/2019   LEFT OUTER SIDE MILD/FREE   Blood transfusion without reported diagnosis    hx of   Breast cancer (HCC) 2022   CAD (coronary artery disease), native coronary artery 02/07/2019   Minimal CAD at the time of cath in 2016 in [place]    Family history of breast cancer 08/19/2021   Family history of pancreatic cancer 08/19/2021   Fatty liver 05/21/2020   Gastric polyps 10/02/2018   EGD 03/2015; benign   GERD (gastroesophageal reflux disease) 10/02/2018   on meds   History of melanoma 10/02/2018   Abdomen 2012; 2013; s/p local excisions.    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 DISTAL LOWER LEG TREATED BY DR. GEORGENE   Sleep apnea    Sleep apnea 05/26/2020   uses CPAP   Tricuspid regurgitation    moderate by echo 04/2023    Patient Active  Problem List   Diagnosis Date Noted   B12 deficiency anemia 10/29/2023   Tricuspid regurgitation    Primary osteoarthritis of right shoulder 04/11/2023   Trochanteric bursitis of both hips 04/11/2023   Atrial tachycardia 02/09/2023   Osteopenia 09/19/2022   SVT (supraventricular tachycardia) 07/11/2022   Genetic testing 08/30/2021   Family history of breast cancer 08/19/2021   Family history of pancreatic cancer 08/19/2021   Malignant neoplasm of overlapping sites of left breast in female, estrogen receptor positive (HCC) 08/16/2021   Hepatic steatosis 07/28/2020   OSA (obstructive sleep apnea) 05/26/2020   Coronary artery disease involving native coronary artery of native heart without angina pectoris 02/07/2019    GERD (gastroesophageal reflux disease) 10/02/2018   Essential hypertension 10/02/2018   Mixed hyperlipidemia 10/02/2018   Osteoporosis of forearm 10/02/2018   Primary osteoarthritis involving multiple joints 10/02/2018   Pulmonary hypertension, primary (HCC) 10/02/2018   History of melanoma 10/02/2018   Polyp of colon, adenomatous 10/02/2018   Gastric polyps 10/02/2018    Past Surgical History:  Procedure Laterality Date   ABDOMINAL HYSTERECTOMY  1990   APPENDECTOMY  1962   BLADDER SUSPENSION  1990   BREAST LUMPECTOMY Left 08/2021   BREAST LUMPECTOMY WITH RADIOACTIVE SEED LOCALIZATION Left 09/09/2021   Procedure: LEFT BREAST LUMPECTOMY WITH RADIOACTIVE SEED LOCALIZATION X2;  Surgeon: [name], MD;  Location: [facility];  Service: General;  Laterality: Left;   COLONOSCOPY  2016   in [place]-hx of polyps   EYE SURGERY  2013 torn retina laser   IR THORACENTESIS ASP PLEURAL SPACE W/IMG GUIDE  02/07/2024   IR THORACENTESIS ASP PLEURAL SPACE W/IMG GUIDE  04/12/2024   IR THORACENTESIS ASP PLEURAL SPACE W/IMG GUIDE  05/09/2024   IR THORACENTESIS ASP PLEURAL SPACE W/IMG GUIDE  06/07/2024   MENISCUS REPAIR Right 2001   right knee   RIGHT HEART CATH AND CORONARY ANGIOGRAPHY N/A 06/20/2023   Procedure: RIGHT HEART CATH AND CORONARY ANGIOGRAPHY;  Surgeon: Rolan Ezra RAMAN, MD;  Location: [facility] INVASIVE CV LAB;  Service: Cardiovascular;  Laterality: N/A;   thumb surgery Left    TONSILLECTOMY AND ADENOIDECTOMY     TRANSESOPHAGEAL ECHOCARDIOGRAM (CATH LAB) N/A 01/04/2024   Procedure: TRANSESOPHAGEAL ECHOCARDIOGRAM;  Surgeon: Santo Stanly LABOR, MD;  Location: [facility]AS[facility];  Service: Cardiovascular;  Laterality: N/A;   tuabl ligation     TUBAL LIGATION  1975   WISDOM TOOTH EXTRACTION      OB History   No obstetric history on file.      Home Medications    Prior to Admission medications   Medication Sig Start Date End Date Taking? Authorizing Provider  cephALEXin   (KEFLEX ) 500 MG capsule Take 1 capsule (500 mg total) by mouth 2 (two) times daily for 5 days. 06/27/24 07/02/24 Yes Mavin Dyke A, NP  diltiazem  (CARDIZEM  CD) 300 MG 24 hr capsule TAKE 1 CAPSULE EVERY DAY 04/12/24  Yes Turner, Wilbert SAUNDERS, MD  ezetimibe  (ZETIA ) 10 MG tablet Take 1 tablet (10 mg total) by mouth daily. 12/13/23  Yes [name], MD  omeprazole  (PRILOSEC) 20 MG capsule TAKE 1 CAPSULE EVERY DAY 05/20/24  Yes [name], MD  ondansetron  (ZOFRAN ) 8 MG tablet Take 1 tablet (8 mg total) by mouth every 8 (eight) hours as needed for nausea or vomiting. 02/12/24  Yes [name], MD  Prenatal Vit-Fe Fumarate-FA (PRENATAL VITAMIN PO) Take by mouth daily. Prenatal w/folic acid po daily   Yes [provider]  rosuvastatin  (CRESTOR ) 20  MG tablet Take 1 tablet (20 mg total) by mouth daily. 12/13/23  Yes Turner, Wilbert SAUNDERS, MD  diazepam  (VALIUM ) 5 MG tablet Take 1 tablet (5 mg total) by mouth as needed for anxiety. Before any invasive procedures or scan 06/11/24   [name], MD  Probiotic Product (ADVANCED PROBIOTIC PO) Take 1 capsule by mouth daily.    [provider]    Family History Family History  Problem Relation Age of Onset   Arthritis Mother    High blood pressure Mother    Hyperlipidemia Mother    Hypertension Mother    Stroke Mother    Varicose Veins Mother    Heart attack Father    Heart disease Father    High blood pressure Father    Hypertension Father    Arthritis Sister    Diabetes Sister    High Cholesterol Sister    High blood pressure Sister    Breast cancer Sister 29   Varicose Veins Sister    Pancreatic cancer Maternal Aunt        dx early 42s   Heart disease Maternal Uncle    Heart disease Maternal Uncle    Stroke Paternal Aunt    Early death Maternal Grandmother        Stroke at 70   High blood pressure Maternal Grandmother    Hypertension Maternal Grandmother    Stroke Maternal Grandmother    Asthma Maternal Grandfather    Kidney disease  Maternal Grandfather    Arthritis Paternal Grandmother    Heart disease Paternal Grandmother    High blood pressure Paternal Grandmother    Hypertension Paternal Grandmother    Kidney disease Paternal Grandfather    Asthma Paternal Grandfather    COPD Paternal Grandfather    Prostate cancer Cousin        paternal cousins x2; dx after 37   Colon polyps Neg Hx    Colon cancer Neg Hx    Esophageal cancer Neg Hx    Rectal cancer Neg Hx    Stomach cancer Neg Hx     Social History Social History   Tobacco Use   Smoking status: Former    Current packs/day: 0.00    Average packs/day: 0.3 packs/day for 10.0 years (2.5 ttl pk-yrs)    Types: Cigarettes    Start date: 09/12/1986    Quit date: 09/12/1996    Years since quitting: 27.8   Smokeless tobacco: Never  Vaping Use   Vaping status: Never Used  Substance Use Topics   Alcohol use: Not Currently    Comment: occassional    Drug use: Never     Allergies   Morphine, Metoprolol , Nebivolol , and Morphine and codeine   Review of Systems Review of Systems  Genitourinary:  Positive for dysuria and frequency.   Per HPI  Physical Exam Triage Vital Signs ED Triage Vitals  Encounter Vitals Group     BP 06/27/24 1243 (!) 184/80     Girls Systolic BP Percentile --      Girls Diastolic BP Percentile --      Boys Systolic BP Percentile --      Boys Diastolic BP Percentile --      Pulse Rate 06/27/24 1153 62     Resp 06/27/24 1153 20     Temp 06/27/24 1153 97.9 F (36.6 C)     Temp src --      SpO2 06/27/24 1153 93 %     Weight --  Height --      Head Circumference --      Peak Flow --      Pain Score --      Pain Loc --      Pain Education --      Exclude from Growth Chart --    No data found.  Updated Vital Signs BP (!) 184/80 (BP Location: Left Arm)   Pulse 62   Temp 97.9 F (36.6 C)   Resp 20   SpO2 93%   Visual Acuity Right Eye Distance:   Left Eye Distance:   Bilateral Distance:    Right Eye Near:    Left Eye Near:    Bilateral Near:     Physical Exam Vitals and nursing note reviewed.  Constitutional:      General: She is awake. She is not in acute distress.    Appearance: Normal appearance. She is well-developed and well-groomed. She is not ill-appearing.  Abdominal:     General: Abdomen is flat. Bowel sounds are normal. There is no distension.     Palpations: Abdomen is soft.     Tenderness: There is no abdominal tenderness. There is no right CVA tenderness, left CVA tenderness, guarding or rebound.  Skin:    General: Skin is warm and dry.  Neurological:     Mental Status: She is alert.  Psychiatric:        Behavior: Behavior is cooperative.      UC Treatments / Results  Labs (all labs ordered are listed, but only abnormal results are displayed) Labs Reviewed  POCT URINALYSIS DIP (MANUAL ENTRY) - Abnormal; Notable for the following components:      Result Value   Color, UA orange (*)    Glucose, UA =100 (*)    Spec Grav, UA <=1.005 (*)    Blood, UA small (*)    Protein Ur, POC =30 (*)    Nitrite, UA Positive (*)    Leukocytes, UA Trace (*)    All other components within normal limits  URINE CULTURE    EKG   Radiology No results found.  Procedures Procedures (including critical care time)  Medications Ordered in UC Medications - No data to display  Initial Impression / Assessment and Plan / UC Course  I have reviewed the triage vital signs and the nursing notes.  Pertinent labs & imaging results that were available during my care of the patient were reviewed by me and considered in my medical decision making (see chart for details).     Patient is overall well-appearing.  Vitals are stable.  Urinalysis reveals positive nitrites, trace leuks, small RBCs which could be indicative of urinary tract infection patient is currently taking Azo which could have skewed these results as well.  Will send urine culture to confirm presence of urinary tract  infection.  Empirically treated with cephalexin  for urinary tract infection.  Recommended rechecking blood pressure once at home and relaxed to ensure that it is not remaining elevated.  Discussed follow-up, return, and strict ER precautions. Final Clinical Impressions(s) / UC Diagnoses   Final diagnoses:  Urinary frequency  Acute cystitis with hematuria     Discharge Instructions      Start taking cephalexin  twice daily for 5 days for urinary tract infection. Your urine culture results will return over the next few days and someone will call if results require adjustment in treatment. Make sure you drinking lots of water and staying hydrated. If you develop worsening symptoms,  flank pain, vomiting, fever, body aches, chills, weakness, or confusion please seek immediate medical treatment in the emergency department.   ED Prescriptions     Medication Sig Dispense Auth. Provider   cephALEXin  (KEFLEX ) 500 MG capsule Take 1 capsule (500 mg total) by mouth 2 (two) times daily for 5 days. 10 capsule Johnie Flaming A, NP      PDMP not reviewed this encounter.   Johnie Flaming A, NP 06/27/24 1326

## 2024-06-27 NOTE — ED Triage Notes (Signed)
 PT reports she had a positive UTI home test yesterday . PT has been taking OTC AZO .

## 2024-06-27 NOTE — Discharge Instructions (Signed)
 Start taking cephalexin  twice daily for 5 days for urinary tract infection. Your urine culture results will return over the next few days and someone will call if results require adjustment in treatment. Make sure you drinking lots of water and staying hydrated. If you develop worsening symptoms, flank pain, vomiting, fever, body aches, chills, weakness, or confusion please seek immediate medical treatment in the emergency department.

## 2024-06-29 LAB — URINE CULTURE: Culture: >=100000 — AB

## 2024-07-01 ENCOUNTER — Ambulatory Visit (HOSPITAL_COMMUNITY): Payer: Self-pay

## 2024-07-12 ENCOUNTER — Ambulatory Visit (HOSPITAL_COMMUNITY)
Admission: RE | Admit: 2024-07-12 | Discharge: 2024-07-12 | Disposition: A | Discharge status: Home / Self Care | Source: Ambulatory Visit

## 2024-07-12 ENCOUNTER — Other Ambulatory Visit (HOSPITAL_COMMUNITY): Payer: Self-pay

## 2024-07-12 ENCOUNTER — Ambulatory Visit (HOSPITAL_COMMUNITY)
Admission: RE | Admit: 2024-07-12 | Discharge: 2024-07-12 | Disposition: A | Discharge status: Home / Self Care | Source: Ambulatory Visit | Attending: Hematology | Admitting: Hematology

## 2024-07-12 DIAGNOSIS — Z9889 Other specified postprocedural states: Secondary | ICD-10-CM | POA: Insufficient documentation

## 2024-07-12 DIAGNOSIS — J90 Pleural effusion, not elsewhere classified: Secondary | ICD-10-CM | POA: Diagnosis not present

## 2024-07-12 DIAGNOSIS — C50812 Malignant neoplasm of overlapping sites of left female breast: Secondary | ICD-10-CM | POA: Diagnosis not present

## 2024-07-12 DIAGNOSIS — Z170 Estrogen receptor positive status [ER+]: Secondary | ICD-10-CM | POA: Insufficient documentation

## 2024-07-12 HISTORY — PX: IR THORACENTESIS ASP PLEURAL SPACE W/IMG GUIDE: IMG5380

## 2024-07-12 MED ORDER — LIDOCAINE HCL 1 % IJ SOLN
INTRAMUSCULAR | Status: AC
  Filled 2024-07-12: qty 20

## 2024-07-12 MED ORDER — LIDOCAINE HCL 1 % IJ SOLN
20.0000 mL | Freq: Once | INTRAMUSCULAR | Status: AC
  Administered 2024-07-12: 13 mL

## 2024-07-12 NOTE — Procedures (Signed)
 PROCEDURE SUMMARY:  Successful image-guided diagnostic and therapeutic thoracentesis from the left chest.  Yielded 750 milliliters of clear, yellow fluid.  No immediate complications.  EBL: zero Patient tolerated well. She has previously poorly tolerated pain felt during procedure. Today, procedure performed with Yueh under US  guidance rather that Safety-centesis catheter with use of hydro dissection with lido during insertion. She responds well to this technique along with the the pre-procedural anxiety medication prescribed by her oncologist.   Specimen not sent for labs.  Post-procedure CXR ordered and will be reviewed prior to departure from department.   Please see imaging section of Epic for full dictation.  Absalom Aro NP 07/12/2024 1:23 PM

## 2024-07-15 ENCOUNTER — Ambulatory Visit (INDEPENDENT_AMBULATORY_CARE_PROVIDER_SITE_OTHER): Admitting: Family Medicine

## 2024-07-15 ENCOUNTER — Encounter: Payer: Self-pay | Admitting: Family Medicine

## 2024-07-15 VITALS — BP 156/70 | HR 66 | Temp 97.7°F | Ht 62.0 in | Wt 151.2 lb

## 2024-07-15 DIAGNOSIS — C50919 Malignant neoplasm of unspecified site of unspecified female breast: Secondary | ICD-10-CM | POA: Diagnosis not present

## 2024-07-15 DIAGNOSIS — E782 Mixed hyperlipidemia: Secondary | ICD-10-CM | POA: Diagnosis not present

## 2024-07-15 DIAGNOSIS — I270 Primary pulmonary hypertension: Secondary | ICD-10-CM

## 2024-07-15 DIAGNOSIS — Z23 Encounter for immunization: Secondary | ICD-10-CM | POA: Diagnosis not present

## 2024-07-15 DIAGNOSIS — Z170 Estrogen receptor positive status [ER+]: Secondary | ICD-10-CM | POA: Diagnosis not present

## 2024-07-15 DIAGNOSIS — M810 Age-related osteoporosis without current pathological fracture: Secondary | ICD-10-CM

## 2024-07-15 DIAGNOSIS — C50812 Malignant neoplasm of overlapping sites of left female breast: Secondary | ICD-10-CM | POA: Diagnosis not present

## 2024-07-15 DIAGNOSIS — I10 Essential (primary) hypertension: Secondary | ICD-10-CM | POA: Diagnosis not present

## 2024-07-15 DIAGNOSIS — C7951 Secondary malignant neoplasm of bone: Secondary | ICD-10-CM

## 2024-07-15 DIAGNOSIS — I2510 Atherosclerotic heart disease of native coronary artery without angina pectoris: Secondary | ICD-10-CM | POA: Diagnosis not present

## 2024-07-15 DIAGNOSIS — I361 Nonrheumatic tricuspid (valve) insufficiency: Secondary | ICD-10-CM

## 2024-07-15 NOTE — Patient Instructions (Signed)
 Please return in 6 months for recheck  If you have any questions or concerns, please don't hesitate to send me a message via MyChart or call the office at [phone]. Thank you for visiting with us  today! It's our pleasure caring for you.    VISIT SUMMARY: Today, we discussed the management of your pleural effusion, blood pressure control, and overall well-being. We reviewed your current symptoms, medications, and recent medical history to create a plan moving forward.  YOUR PLAN: -RECURRENT PLEURAL EFFUSION: Pleural effusion is the buildup of fluid around the lungs, which can cause breathing difficulties. You recently had a thoracentesis to relieve this, but the fluid is reaccumulating. We will continue to monitor your symptoms and manage them as needed.  -BONE METASTASES: Bone metastases occur when cancer spreads to the bones, causing pain and discomfort. Your pain is currently managed with Tylenol . We will continue this regimen and monitor your symptoms.  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 Your blood pressure is currently not well-controlled. We will schedule an appointment with your cardiologist to manage your blood pressure and evaluate your heart condition. Please avoid starting any new blood pressure medications until after this consultation.  -GENERAL HEALTH MAINTENANCE: Your kidney function has normalized, and you are maintaining an active lifestyle. Continue taking your current medications for cholesterol management, and keep up with your social activities and exercise as tolerated.  INSTRUCTIONS: Please schedule an appointment with your cardiologist to discuss your blood pressure management and heart condition. Continue monitoring your blood pressure and kidney function at home. If you experience any new or worsening symptoms, please contact our  office.                      Contains text generated by [employer]bridge.                                 Contains text generated by [employer]bridge.

## 2024-07-15 NOTE — Progress Notes (Signed)
 Subjective  Chief Complaint  Patient presents with   Annual Exam    Pt here for Annual Exam and is not currently fasting    Hypertension         HPI: Madison Clements is a 78 y.o. female who presents to [facility] [place] today for a Female Wellness Visit. She also has the concerns and/or needs as listed above in the chief complaint. These will be addressed in addition to the Health Maintenance Visit.   Wellness Visit: annual visit with health maintenance review and exam  HM: Patient diagnosed with metastatic breast cancer.  I have been following along with oncology.  Recurrent pleural effusions status post recent repeated thoracentesis.  No longer short of breath.  On palliative care.  Has failed chemotherapy due to side effects.  Fortunately, feeling well overall.  See below.  Chronic disease f/u and/or acute problem visit: (deemed necessary to be done in addition to the wellness visit): Discussed the use of AI scribe software for clinical note transcription with the patient, who gave verbal consent to proceed.  History of Present Illness Madison Clements is an 78 year old female with metastatic cancer and cardiac issues who presents for management of pleural effusion and blood pressure control.  Pleural effusion and dyspnea - Recurrent pleural effusions with most recent thoracentesis performed last week, resulting in temporary relief of breathing difficulties - Effusions continue to reaccumulate - No current ongoing treatment for pleural effusion - Occasional mild swelling, not significant - Occasional use of Lasix  as needed  Metastatic malignancy and bone pain - Metastatic cancer involving bones of the upper body, confirmed by PET scan - Bone metastases cause intermittent discomfort - Pain managed with Tylenol  only; no other pain medications used - Previously treated with anastrozole  and Ibrance ; Ibrance  discontinued due to severe joint pain  and significant leukopenia - Genetic studies performed for clinical trial eligibility, but not eligible  Cardiac dysfunction and blood pressure management - History of severe tricuspid valve regurgitation diagnosed by esophageal echocardiogram - Blood pressure reportedly elevated at home - No current antihypertensive therapy after discontinuation of lisinopril  and diuretic due to prior kidney concerns - Kidney function has since normalized  Functional status and general well-being - Maintains an active lifestyle, including playing Mahjong and cards, and visiting friends and family - Generally feels well and sleeps well - Weight regained after prior cancer-related weight loss - Uses a cane at home due to knee issues  Hyperlipidemia - Currently taking rosuvastatin  and Zetia  for cholesterol management, reviewed recent lipid panel from April.  LDL is at goal.  Recent LFTs are normal.  Hypertension: Currently uncontrolled.  She is taking Cardizem  only at this time.  Had been on lisinopril  and diuretic but had renal insufficiency that has since normalized.  No chest pain or shortness of breath.  Home readings concur with our reading here today.   Assessment  1. Malignant neoplasm of overlapping sites of left breast in female, estrogen receptor positive (HCC)   2. Need for influenza vaccination   3. Essential hypertension   4. Carcinoma of breast metastatic to bone, unspecified laterality (HCC)   5. Mixed hyperlipidemia   6. Coronary artery disease involving native coronary artery of native heart without angina pectoris   7. Pulmonary hypertension, primary (HCC)   8. Nonrheumatic tricuspid valve regurgitation   9. Osteoporosis of forearm      Plan  Female Wellness Visit: Age appropriate Health Maintenance and Prevention  measures were discussed with patient. Included topics are cancer screening recommendations, ways to keep healthy (see AVS) including dietary and exercise recommendations,  regular eye and dental care, use of seat belts, and avoidance of moderate alcohol use and tobacco use.  BMI: discussed patient's BMI and encouraged positive lifestyle modifications to help get to or maintain a target BMI. HM needs and immunizations were addressed and ordered. See below for orders. See HM and immunization section for updates. Routine labs and screening tests ordered including cmp, cbc and lipids where appropriate. Discussed recommendations regarding Vit D and calcium  supplementation (see AVS)  Chronic disease management visit and/or acute problem visit: Assessment and Plan Assessment & Plan Malignant neoplasm of left breast with bone metastases and recurrent left-sided pleural effusion Recurrent left-sided pleural effusion with recent thoracentesis. Bone metastases present in the upper body, causing some discomfort managed with Tylenol . Previous treatment with anastrozole  and Ibrance  was discontinued due to adverse effects, including leukopenia and joint pain. Genetic studies are ongoing to explore eligibility for clinical trials. Currently not on any active cancer treatment. - Continue Tylenol  for bone pain management. - Continue to monitor for symptoms of pleural effusion and manage as needed. - Continue palliative care per oncology. - Very glad she is feeling as well as she is.  She is coping well.  Will monitor.  She is to call me if she needs anything.  Essential hypertension in the context of severe tricuspid valve regurgitation Hypertension is poorly controlled, likely due to discontinuation of lisinopril  and diuretics by cardiologist due to renal dysfunction. Severe tricuspid valve regurgitation complicates management. Kidney function is currently normal. Coordination with cardiologist is necessary to manage hypertension while considering cardiac status. - Schedule appointment with cardiologist for hypertension management and evaluation of tricuspid valve regurgitation. -  Avoid starting new antihypertensive medications until cardiologist consultation.  Could consider restarting ACE or ARB and monitoring renal function closely.  Would like to avoid diuretic given severe tricuspid regurg.  Will get cardiology input before making any changes. - Monitor blood pressure and kidney function.  Lipid management: Well-controlled on statin and Zetia .  Osteoporosis: No longer recommend treatment given metastatic breast cancer.  Flu shot updated today.  I spent a total of 48 minutes for this patient encounter. Time spent included preparation, face-to-face counseling with the patient and coordination of care, review of chart and records, and documentation of the encounter.  Follow up: 6 months for recheck Orders Placed This Encounter  Procedures   Flu vaccine HIGH DOSE PF(Fluzone Trivalent)   No orders of the defined types were placed in this encounter.     Body mass index is 27.65 kg/m. Wt Readings from Last 3 Encounters:  07/15/24 151 lb 3.2 oz (68.6 kg)  06/11/24 151 lb 11.2 oz (68.8 kg)  05/27/24 147 lb 4 oz (66.8 kg)     Patient Active Problem List   Diagnosis Date Noted   Tricuspid regurgitation     Priority: High    severe by echo 04/2023 Dr. Shlomo: Echo showed normal heart function EF 60-65%. Also read out as severe leakiness of TV with normal RV and RA size.  I personally reviewed her echo images and I think her TR is moderate. Since her RV and RA are normal will continue to follow TR - repeat echo in 6 months     SVT (supraventricular tachycardia) 07/11/2022    Priority: High   Malignant neoplasm of overlapping sites of left breast in female, estrogen receptor positive (HCC) 08/16/2021  Priority: High   OSA (obstructive sleep apnea) 05/26/2020    Priority: High   Coronary artery disease involving native coronary artery of native heart without angina pectoris 02/07/2019    Priority: High    Minimal CAD at the time of cath in 2016 in [place]     Essential hypertension 10/02/2018    Priority: High   Mixed hyperlipidemia 10/02/2018    Priority: High   Pulmonary hypertension, primary (HCC) 10/02/2018    Priority: High    Diagnosed around 2010; has seen pulm and cards in the past.  Left heart catheterization in 2016 - minimal vascular disease Echocardiogram 02/2019 with worsening pulm htn: moderate. Mild diastolic dysfunction. Nl EF 2D echo 04/2022 showed normal heart function with mildly dilated RV, mild to moderate TR and mildly calcified AV.  No Pulmonary HTN but RV now enlarged - repeat echo in 6 months for pulmnoary HTN      History of melanoma 10/02/2018    Priority: High    Abdomen 2012; 2013; s/p local excisions.     Polyp of colon, adenomatous 10/02/2018    Priority: High    Colonoscopy 03/2015 and 2021    Primary osteoarthritis of right shoulder 04/11/2023    Priority: Medium    Hepatic steatosis 07/28/2020    Priority: Medium    GERD (gastroesophageal reflux disease) 10/02/2018    Priority: Medium    Osteoporosis of forearm 10/02/2018    Priority: Medium     Dexa 05/2022 t = -3.7 at forearm, -2.1 at hip; offered fosamax. Significant decreases. dexa 11/3019  t = -1.5 lowest, stable osteopenia. Repeat in 2 years.  Dexa 2019: T = -1.4 lowest; recheck 2 years.     Primary osteoarthritis involving multiple joints 10/02/2018    Priority: Medium     Daily celebrex ; bilateral knees, h/o right meniscal repair; has had viscosupplementation and steroid injections in the past.  Bilateral hands, 2024: xrays lumbar spine with multilevel DJD and bilateral hips with mild OA changes    Gastric polyps 10/02/2018    Priority: Medium     EGD 03/2015; benign; done for dysphagia    B12 deficiency anemia 10/29/2023   Trochanteric bursitis of both hips 04/11/2023   Atrial tachycardia 02/09/2023   Osteopenia 09/19/2022   Genetic testing 08/30/2021    Negative hereditary cancer genetic testing: no pathogenic  variants detected in [facility] BRCAPlus Panel and CancerNext-Expanded +RNAinsight Panel.  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  The report dates are 08/27/2021 and 08/30/2021.   The BRCAplus panel offered by [employer] and includes sequencing and deletion/duplication analysis for the following 8 genes: ATM, BRCA1, BRCA2, CDH1, CHEK2, PALB2, PTEN, and TP53.  The CancerNext-Expanded gene panel offered by [facility] and includes sequencing, rearrangement, and RNA analysis for the following 77 genes: AIP, ALK, APC, ATM, AXIN2, BAP1, BARD1, BLM, BMPR1A, BRCA1, BRCA2, BRIP1, CDC73, CDH1, CDK4, CDKN1B, CDKN2A, CHEK2, CTNNA1, DICER1, FANCC, FH, FLCN, GALNT12, KIF1B, LZTR1, MAX, MEN1, MET, MLH1, MSH2, MSH3, MSH6, MUTYH, NBN, NF1, NF2, NTHL1, PALB2, PHOX2B, PMS2, POT1, PRKAR1A, PTCH1, PTEN, RAD51C, RAD51D, RB1, RECQL, RET, SDHA, SDHAF2, SDHB, SDHC, SDHD, SMAD4, SMARCA4, SMARCB1, SMARCE1, STK11, SUFU, TMEM127, TP53, TSC1, TSC2, VHL and XRCC2 (sequencing and deletion/duplication); EGFR, EGLN1, HOXB13, KIT, MITF, PDGFRA, POLD1, and POLE (sequencing only); EPCAM and GREM1 (deletion/duplication only).      Family history of breast cancer 08/19/2021   Family history of pancreatic cancer 08/19/2021   Health Maintenance  Topic Date Due  Medicare Annual Wellness (AWV)  03/20/2024   COVID-19 Vaccine (4 - 2025-26 season) 07/31/2024 (Originally 05/13/2024)   Mammogram  02/05/2025   Colonoscopy  08/14/2025   DEXA SCAN  06/10/2026   Pneumococcal Vaccine: 50+ Years  Completed   Influenza Vaccine  Completed   Meningococcal B Vaccine  Aged Out   DTaP/Tdap/Td  Discontinued   Hepatitis C Screening  Discontinued   Zoster Vaccines- Shingrix   Discontinued   Immunization History  Administered Date(s) Administered   Fluad Quad(high Dose 65+) 07/16/2019, 07/09/2021   Fluad Trivalent(High Dose 65+) 07/14/2023   INFLUENZA, HIGH DOSE SEASONAL PF 06/04/2020, 07/15/2024   PFIZER(Purple  Top)SARS-COV-2 Vaccination 09/27/2019, 10/18/2019, 07/03/2020   Pneumococcal Conjugate-13 07/18/2013   Pneumococcal Polysaccharide-23 09/11/2009   Zoster Recombinant(Shingrix ) 06/24/2020   We updated and reviewed the patient's past history in detail and it is documented below. Allergies: Patient is allergic to morphine, metoprolol , nebivolol , and morphine and codeine. Past Medical History Patient  has a past medical history of Allergy (1990), Arthritis, Atypical mole (02/24/2015), Atypical mole (01/20/2022), Atypical mole (01/20/2022), Atypical nevi (02/24/2015), Atypical nevi (08/25/2015), Atypical nevi (08/25/2015), Atypical nevi (02/23/2016), Atypical nevi (02/23/2016), Atypical nevi (02/23/2016), Atypical nevi (08/23/2016), Atypical nevi (07/25/2018), Atypical nevi (01/22/2019), Atypical nevi (01/22/2019), Atypical nevi (08/06/2019), Atypical nevi (08/06/2019), Blood transfusion without reported diagnosis, Breast cancer (HCC) (2022), CAD (coronary artery disease), native coronary artery (02/07/2019), Family history of breast cancer (08/19/2021), Family history of pancreatic cancer (08/19/2021), Fatty liver (05/21/2020), Gastric polyps (10/02/2018), GERD (gastroesophageal reflux disease) (10/02/2018), History of melanoma (10/02/2018), Hyperlipidemia, Hypertension, Melanoma (HCC) (2012), Osteopenia, Personal history of radiation therapy, Polyp of colon, adenomatous (10/02/2018), PONV (postoperative nausea and vomiting), Pulmonary hypertension, primary (HCC) (10/02/2018), SCCA (squamous cell carcinoma) of skin (09/18/2017), Sleep apnea, Sleep apnea (05/26/2020), and Tricuspid regurgitation. Past Surgical History Patient  has a past surgical history that includes Meniscus repair (Right, 2001); Appendectomy (1962); Abdominal hysterectomy (1990); Bladder suspension (1990); thumb surgery (Left); Colonoscopy (2016); tuabl ligation; Tonsillectomy and adenoidectomy; Wisdom tooth extraction; Eye surgery (2013  torn retina laser); Tubal ligation (1975); Breast lumpectomy with radioactive seed localization (Left, 09/09/2021); RIGHT HEART CATH AND CORONARY ANGIOGRAPHY (N/A, 06/20/2023); TRANSESOPHAGEAL ECHOCARDIOGRAM (N/A, 01/04/2024); Breast lumpectomy (Left, 08/2021); IR THORACENTESIS ASP PLEURAL SPACE W/IMG GUIDE (02/07/2024); IR THORACENTESIS ASP PLEURAL SPACE W/IMG GUIDE (04/12/2024); IR THORACENTESIS ASP PLEURAL SPACE W/IMG GUIDE (05/09/2024); IR THORACENTESIS ASP PLEURAL SPACE W/IMG GUIDE (06/07/2024); and IR THORACENTESIS ASP PLEURAL SPACE W/IMG GUIDE (07/12/2024). Family History: Patient family history includes Arthritis in her mother, paternal grandmother, and sister; Asthma in her maternal grandfather and paternal grandfather; Breast cancer (age of onset: 8) in her sister; COPD in her paternal grandfather; Diabetes in her sister; Early death in her maternal grandmother; Heart attack in her father; Heart disease in her father, maternal uncle, maternal uncle, and paternal grandmother; High Cholesterol in her sister; High blood pressure in her father, maternal grandmother, mother, paternal grandmother, and sister; Hyperlipidemia in her mother; Hypertension in her father, maternal grandmother, mother, and paternal grandmother; Kidney disease in her maternal grandfather and paternal grandfather; Pancreatic cancer in her maternal aunt; Prostate cancer in her cousin; Stroke in her maternal grandmother, mother, and paternal aunt; Varicose Veins in her mother and sister. Social History:  Patient  reports that she quit smoking about 27 years ago. Her smoking use included cigarettes. She started smoking about 37 years ago. She has a 2.5 pack-year smoking history. She has never used smokeless tobacco. She reports that she does not currently use alcohol. She reports that she does not  use drugs.  Review of Systems: Constitutional: negative for fever or malaise Ophthalmic: negative for photophobia, double vision or loss of  vision Cardiovascular: negative for chest pain, dyspnea on exertion, or new LE swelling Respiratory: negative for SOB or persistent cough Gastrointestinal: negative for abdominal pain, change in bowel habits or melena Genitourinary: negative for dysuria or gross hematuria, no abnormal uterine bleeding or disharge Musculoskeletal: negative for new gait disturbance or muscular weakness Integumentary: negative for new or persistent rashes, no breast lumps Neurological: negative for TIA or stroke symptoms Psychiatric: negative for SI or delusions Allergic/Immunologic: negative for hives  Patient Care Team    Relationship Specialty Notifications Start End  [name], MD PCP - General Family Medicine  10/02/18   [name], MD PCP - Cardiology Cardiology  05/10/21   [name], MD Consulting Physician Cardiology  02/13/19   [name], MD Consulting Physician Ophthalmology  08/02/19   [name] DEVONNA (Inactive) Physician Assistant Dermatology  08/02/19   [name], MD Consulting Physician General Surgery  08/13/21   [name], MD Consulting Physician Hematology  08/13/21   [name], MD Attending Physician Radiation Oncology  08/13/21   [name], [facility] (Inactive)  Pharmacist  08/24/21    Comment: [phone]    Objective  Vitals: BP (!) 156/70   Pulse 66   Temp 97.7 F (36.5 C)   Ht 5' 2 (1.575 m)   Wt 151 lb 3.2 oz (68.6 kg)   SpO2 95%   BMI 27.65 kg/m  General:  Well developed, well nourished, no acute distress, appears well HEENT:  Normocephalic, atraumatic, non-icteric sclera,  supple neck without adenopathy, mass or thyromegaly Cardiovascular:  Normal S1, S2, RRR without gallop, rub positive murmur Respiratory:  Good breath sounds bilaterally, CTAB with normal respiratory effort, no dullness Gastrointestinal: normal bowel sounds, soft, non-tender, no noted masses. No HSM MSK: extremities without edema, joints without erythema or  swelling Neurologic:    Mental status is normal.  Gross motor and sensory exams are normal.  No tremor  Commons side effects, risks, benefits, and alternatives for medications and treatment plan prescribed today were discussed, and the patient expressed understanding of the given instructions. Patient is instructed to call or message via MyChart if he/she has any questions or concerns regarding our treatment plan. No barriers to understanding were identified. We discussed Red Flag symptoms and signs in detail. Patient expressed understanding regarding what to do in case of urgent or emergency type symptoms.  Medication list was reconciled, printed and provided to the patient in AVS. Patient instructions and summary information was reviewed with the patient as documented in the AVS. This note was prepared with assistance of Dragon voice recognition software. Occasional wrong-word or sound-a-like substitutions may have occurred due to the inherent limitations of voice recognition software

## 2024-07-16 ENCOUNTER — Telehealth: Payer: Self-pay | Admitting: Cardiology

## 2024-07-16 DIAGNOSIS — I10 Essential (primary) hypertension: Secondary | ICD-10-CM

## 2024-07-16 NOTE — Telephone Encounter (Signed)
 Pt c/o BP issue: STAT if pt c/o blurred vision, one-sided weakness or slurred speech.  STAT if BP is GREATER than 180/120 TODAY.  STAT if BP is LESS than 90/60 and SYMPTOMATIC TODAY  1. What is your BP concern? Hypertension   2. Have you taken any BP medication today? Takes at night   3. What are your last 5 BP readings?143/83, 146/83,151/81, 156/82  4. Are you having any other symptoms (ex. Dizziness, headache, blurred vision, passed out)? No

## 2024-07-16 NOTE — Telephone Encounter (Signed)
Attempted to call pt, unable to reach. LMTCB.

## 2024-07-17 ENCOUNTER — Encounter: Payer: Self-pay | Admitting: Cardiology

## 2024-07-18 NOTE — Telephone Encounter (Signed)
 Returned patient's call. Patient reports she is currently undergoing treatment for breast cancer with bone mets. Her only current drug for this is zometa , which is an infusion for bone metastases that she gets every 3 months. She says her bone pain is currently managed with tylenol . However, she has noticed her BP readings trending up to 140 or 150 over 80. Heart rates remain in the 80's. She checks her blood pressure in the morning and takes her cardizem  cd 300 mg every night. She denies any headache, blurry vision or fatigue. She is asking if she should consider adjustment of her blood pressures medications to get her numbers down.   Asked patient to check BP once in the morning and once in the evening before she takes her cardizem  for the next few day and then call us  on Monday with the readings since she reports she is not experiencing symptoms. Patient verbalizes understanding and agrees to plan.

## 2024-07-25 ENCOUNTER — Other Ambulatory Visit: Payer: Self-pay

## 2024-07-25 ENCOUNTER — Encounter: Payer: Self-pay | Admitting: Hematology

## 2024-07-25 DIAGNOSIS — C50812 Malignant neoplasm of overlapping sites of left female breast: Secondary | ICD-10-CM

## 2024-07-25 DIAGNOSIS — D649 Anemia, unspecified: Secondary | ICD-10-CM

## 2024-07-25 DIAGNOSIS — D519 Vitamin B12 deficiency anemia, unspecified: Secondary | ICD-10-CM

## 2024-07-25 NOTE — Telephone Encounter (Signed)
 Call to patient to ask for BP log as she previously reported she was concerned her treatment for cancer was raising her blood pressures. See below. Patient continues to deny symptoms. Patient reports she takes her cardizem  at 10 pm at night.  07/19/24 9 AM 165/92 HR 66 9 PM 176/90 HR74  07/20/24 9AM 139/87 HR 83 9 PM 167/80 HR 82  07/21/24 9 AM 169/75 HR 74 9 PM  141/99 HR 78  07/22/24 9AM 148/90 HR 68 9 PM 168/90 HR 85  07/23/24  9 AM 132/80 HR 81  9 PM 156/91 HR 83  07/24/24 9 AM 135/82 HR 80 9 PM 165/94 HR 75

## 2024-07-26 ENCOUNTER — Inpatient Hospital Stay: Attending: Hematology

## 2024-07-26 DIAGNOSIS — D519 Vitamin B12 deficiency anemia, unspecified: Secondary | ICD-10-CM | POA: Insufficient documentation

## 2024-07-26 DIAGNOSIS — J910 Malignant pleural effusion: Secondary | ICD-10-CM | POA: Insufficient documentation

## 2024-07-26 DIAGNOSIS — Z170 Estrogen receptor positive status [ER+]: Secondary | ICD-10-CM | POA: Diagnosis not present

## 2024-07-26 DIAGNOSIS — Z1732 Human epidermal growth factor receptor 2 negative status: Secondary | ICD-10-CM | POA: Insufficient documentation

## 2024-07-26 DIAGNOSIS — C7951 Secondary malignant neoplasm of bone: Secondary | ICD-10-CM | POA: Diagnosis not present

## 2024-07-26 DIAGNOSIS — D649 Anemia, unspecified: Secondary | ICD-10-CM

## 2024-07-26 DIAGNOSIS — Z1721 Progesterone receptor positive status: Secondary | ICD-10-CM | POA: Diagnosis not present

## 2024-07-26 DIAGNOSIS — C50812 Malignant neoplasm of overlapping sites of left female breast: Secondary | ICD-10-CM | POA: Insufficient documentation

## 2024-07-26 LAB — CBC WITH DIFFERENTIAL (CANCER CENTER ONLY)
Abs Immature Granulocytes: 0.02 K/uL (ref 0.00–0.07)
Basophils Absolute: 0 K/uL (ref 0.0–0.1)
Basophils Relative: 1 %
Eosinophils Absolute: 0.1 K/uL (ref 0.0–0.5)
Eosinophils Relative: 1 %
HCT: 40.9 % (ref 36.0–46.0)
Hemoglobin: 14.3 g/dL (ref 12.0–15.0)
Immature Granulocytes: 0 %
Lymphocytes Relative: 27 %
Lymphs Abs: 1.5 K/uL (ref 0.7–4.0)
MCH: 33.3 pg (ref 26.0–34.0)
MCHC: 35 g/dL (ref 30.0–36.0)
MCV: 95.3 fL (ref 80.0–100.0)
Monocytes Absolute: 0.5 K/uL (ref 0.1–1.0)
Monocytes Relative: 9 %
Neutro Abs: 3.4 K/uL (ref 1.7–7.7)
Neutrophils Relative %: 62 %
Platelet Count: 223 K/uL (ref 150–400)
RBC: 4.29 MIL/uL (ref 3.87–5.11)
RDW: 13.1 % (ref 11.5–15.5)
WBC Count: 5.6 K/uL (ref 4.0–10.5)
nRBC: 0 % (ref 0.0–0.2)

## 2024-07-26 LAB — IRON AND IRON BINDING CAPACITY (CC-WL,HP ONLY)
Iron: 136 ug/dL (ref 28–170)
Saturation Ratios: 38 % — ABNORMAL HIGH (ref 10.4–31.8)
TIBC: 361 ug/dL (ref 250–450)
UIBC: 225 ug/dL (ref 148–442)

## 2024-07-26 LAB — VITAMIN B12: Vitamin B-12: 512 pg/mL (ref 180–914)

## 2024-07-26 LAB — FERRITIN: Ferritin: 380 ng/mL — ABNORMAL HIGH (ref 11–307)

## 2024-07-26 MED ORDER — LOSARTAN POTASSIUM 25 MG PO TABS: 25.0000 mg | ORAL_TABLET | Freq: Every day | ORAL | 3 refills | Status: DC

## 2024-07-26 NOTE — Addendum Note (Signed)
 Addended by: DRENA MARTINIS, Miski Feldp[address] L on: 07/26/2024 01:32 PM   Modules accepted: Orders

## 2024-07-26 NOTE — Telephone Encounter (Signed)
 [name], MD to Me  Janit Geni CROME, RN   07/26/24 11:03 AM Add Losartan 25mg  daily and check BMET in 1 week  Called patient with Dr. Dorine advisement. Sent Losartan 25 mg by mouth daily to patient's pharmacy. Patient will get lab work in one week at [facility].

## 2024-07-26 NOTE — Telephone Encounter (Signed)
 Called patient with Dr. Dorine message. Patient stated she is not overly concern about her BP, and says she was just following up with Dr. Shlomo per her PCP telling her too. She said she has more important things to be concerned with like her cancer. Patient stated she has not changed anything in her diet, so she knows that is not the cause. Patient stated that she was done with the BP from where she is concerned. She stated we do not need to do anything about it. Will forward to Dr. Shlomo and her nurse.

## 2024-08-03 ENCOUNTER — Encounter: Payer: Self-pay | Admitting: Hematology

## 2024-08-05 NOTE — Telephone Encounter (Signed)
 Call to patient who confirms she has plans to complete labs (BMET) tomorrow.

## 2024-08-06 DIAGNOSIS — I10 Essential (primary) hypertension: Secondary | ICD-10-CM | POA: Diagnosis not present

## 2024-08-07 LAB — BASIC METABOLIC PANEL WITH GFR
BUN/Creatinine Ratio: 18 (ref 12–28)
BUN: 15 mg/dL (ref 8–27)
CO2: 19 mmol/L — ABNORMAL LOW (ref 20–29)
Calcium: 10 mg/dL (ref 8.7–10.3)
Chloride: 103 mmol/L (ref 96–106)
Creatinine, Ser: 0.85 mg/dL (ref 0.57–1.00)
Glucose: 96 mg/dL (ref 70–99)
Potassium: 4.5 mmol/L (ref 3.5–5.2)
Sodium: 136 mmol/L (ref 134–144)
eGFR: 69 mL/min/1.73 (ref >59)

## 2024-08-09 ENCOUNTER — Ambulatory Visit: Payer: Self-pay | Admitting: Cardiology

## 2024-08-12 ENCOUNTER — Other Ambulatory Visit

## 2024-08-12 ENCOUNTER — Encounter (HOSPITAL_COMMUNITY)
Admission: RE | Admit: 2024-08-12 | Discharge: 2024-08-12 | Disposition: A | Source: Ambulatory Visit | Attending: Hematology

## 2024-08-12 ENCOUNTER — Inpatient Hospital Stay: Attending: Hematology

## 2024-08-12 DIAGNOSIS — D519 Vitamin B12 deficiency anemia, unspecified: Secondary | ICD-10-CM | POA: Insufficient documentation

## 2024-08-12 DIAGNOSIS — I071 Rheumatic tricuspid insufficiency: Secondary | ICD-10-CM | POA: Insufficient documentation

## 2024-08-12 DIAGNOSIS — C50812 Malignant neoplasm of overlapping sites of left female breast: Secondary | ICD-10-CM | POA: Insufficient documentation

## 2024-08-12 DIAGNOSIS — Z79810 Long term (current) use of selective estrogen receptor modulators (SERMs): Secondary | ICD-10-CM | POA: Insufficient documentation

## 2024-08-12 DIAGNOSIS — J910 Malignant pleural effusion: Secondary | ICD-10-CM | POA: Insufficient documentation

## 2024-08-12 DIAGNOSIS — Z170 Estrogen receptor positive status [ER+]: Secondary | ICD-10-CM | POA: Insufficient documentation

## 2024-08-12 DIAGNOSIS — Z1732 Human epidermal growth factor receptor 2 negative status: Secondary | ICD-10-CM | POA: Insufficient documentation

## 2024-08-12 DIAGNOSIS — Z923 Personal history of irradiation: Secondary | ICD-10-CM | POA: Insufficient documentation

## 2024-08-12 DIAGNOSIS — F419 Anxiety disorder, unspecified: Secondary | ICD-10-CM | POA: Insufficient documentation

## 2024-08-12 DIAGNOSIS — C7951 Secondary malignant neoplasm of bone: Secondary | ICD-10-CM | POA: Insufficient documentation

## 2024-08-12 DIAGNOSIS — E041 Nontoxic single thyroid nodule: Secondary | ICD-10-CM | POA: Insufficient documentation

## 2024-08-12 DIAGNOSIS — D649 Anemia, unspecified: Secondary | ICD-10-CM

## 2024-08-12 DIAGNOSIS — Z1721 Progesterone receptor positive status: Secondary | ICD-10-CM | POA: Insufficient documentation

## 2024-08-12 DIAGNOSIS — C50919 Malignant neoplasm of unspecified site of unspecified female breast: Secondary | ICD-10-CM | POA: Diagnosis not present

## 2024-08-12 LAB — COMPREHENSIVE METABOLIC PANEL WITH GFR
ALT: 41 U/L (ref 0–44)
AST: 37 U/L (ref 15–41)
Albumin: 4.6 g/dL (ref 3.5–5.0)
Alkaline Phosphatase: 75 U/L (ref 38–126)
Anion gap: 12 (ref 5–15)
BUN: 16 mg/dL (ref 8–23)
CO2: 23 mmol/L (ref 22–32)
Calcium: 9.7 mg/dL (ref 8.9–10.3)
Chloride: 104 mmol/L (ref 98–111)
Creatinine, Ser: 0.87 mg/dL (ref 0.44–1.00)
GFR, Estimated: >60 mL/min (ref >60)
Glucose, Bld: 104 mg/dL — ABNORMAL HIGH (ref 70–99)
Potassium: 4.3 mmol/L (ref 3.5–5.1)
Sodium: 138 mmol/L (ref 135–145)
Total Bilirubin: 0.9 mg/dL (ref 0.0–1.2)
Total Protein: 7 g/dL (ref 6.5–8.1)

## 2024-08-12 LAB — CBC WITH DIFFERENTIAL (CANCER CENTER ONLY)
Abs Immature Granulocytes: 0.02 K/uL (ref 0.00–0.07)
Basophils Absolute: 0 K/uL (ref 0.0–0.1)
Basophils Relative: 0 %
Eosinophils Absolute: 0.1 K/uL (ref 0.0–0.5)
Eosinophils Relative: 1 %
HCT: 39.9 % (ref 36.0–46.0)
Hemoglobin: 13.9 g/dL (ref 12.0–15.0)
Immature Granulocytes: 0 %
Lymphocytes Relative: 25 %
Lymphs Abs: 1.5 K/uL (ref 0.7–4.0)
MCH: 33.3 pg (ref 26.0–34.0)
MCHC: 34.8 g/dL (ref 30.0–36.0)
MCV: 95.7 fL (ref 80.0–100.0)
Monocytes Absolute: 0.5 K/uL (ref 0.1–1.0)
Monocytes Relative: 9 %
Neutro Abs: 3.8 K/uL (ref 1.7–7.7)
Neutrophils Relative %: 65 %
Platelet Count: 252 K/uL (ref 150–400)
RBC: 4.17 MIL/uL (ref 3.87–5.11)
RDW: 12.7 % (ref 11.5–15.5)
WBC Count: 5.9 K/uL (ref 4.0–10.5)
nRBC: 0 % (ref 0.0–0.2)

## 2024-08-12 MED ORDER — FLUOROESTRADIOL F 18 4-100 MCI/ML IV SOLN
6.0000 | Freq: Once | INTRAVENOUS | Status: AC
  Administered 2024-08-12: 6.06 via INTRAVENOUS

## 2024-08-13 ENCOUNTER — Other Ambulatory Visit (HOSPITAL_COMMUNITY): Payer: Self-pay | Admitting: Hematology

## 2024-08-13 DIAGNOSIS — J910 Malignant pleural effusion: Secondary | ICD-10-CM

## 2024-08-13 DIAGNOSIS — M170 Bilateral primary osteoarthritis of knee: Secondary | ICD-10-CM | POA: Diagnosis not present

## 2024-08-18 NOTE — Assessment & Plan Note (Signed)
 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 Biopsy 08/11/21 confirmed invasive mammary carcinoma. -left lumpectomy on 09/09/21 by Dr. 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 No nodes were removed due to her age. -she received adjuvant RT, completed in 11/2021 -she started adjuvant tamoxifen  in 12/2021, but could not tolerate. She subsequently tried anastrozole  and still can not tolerate. She stopped in 04/2022 -Screening mammogram in May 2024 were negative. - She developed pleural effusion in April 2025, cytology unfortunately showed malignant cells, consistent with breast primary. ERand PR moderately positive, Her2 negative (IHC 0) -FDG PET 01/08/2024 was negative for hypermetabolic lesion or hypermetabolism around the pleura.  [place]  PET scan 01/26/2024 showed diffuse bone mets and mediastinal adenopathy. Pleura was negative on PET -her skin (scalp) biopsy also showed metastatic breast cancer, ER and PR positive, HER2 negative.   -She has started anastrozole  in 12/2023  - Due to her recurrent pleural effusion, and high disease burden, I will add a low-dose Ibrance  for better disease control - Continue Zometa , will change to every 3 months due to her diffuse bone metastasis. - Due to severe joint pain especially in her knees, she stopped anastrozole  and Ibrance  in early July.

## 2024-08-19 ENCOUNTER — Telehealth: Payer: Self-pay

## 2024-08-19 ENCOUNTER — Inpatient Hospital Stay: Admitting: Hematology

## 2024-08-19 VITALS — BP 155/66 | HR 74 | Temp 97.8°F | Resp 16 | Ht 62.0 in | Wt 150.8 lb

## 2024-08-19 DIAGNOSIS — Z170 Estrogen receptor positive status [ER+]: Secondary | ICD-10-CM

## 2024-08-19 DIAGNOSIS — D519 Vitamin B12 deficiency anemia, unspecified: Secondary | ICD-10-CM | POA: Diagnosis not present

## 2024-08-19 DIAGNOSIS — C50812 Malignant neoplasm of overlapping sites of left female breast: Secondary | ICD-10-CM | POA: Diagnosis not present

## 2024-08-19 DIAGNOSIS — E041 Nontoxic single thyroid nodule: Secondary | ICD-10-CM | POA: Diagnosis not present

## 2024-08-19 MED ORDER — TAMOXIFEN CITRATE 10 MG PO TABS: 10.0000 mg | ORAL_TABLET | Freq: Every day | ORAL | 0 refills | Status: DC

## 2024-08-19 MED ORDER — ALPRAZOLAM 0.5 MG PO TABS: 0.5000 mg | ORAL_TABLET | Freq: Every evening | ORAL | 0 refills | Status: AC | PRN

## 2024-08-19 NOTE — Telephone Encounter (Signed)
 Spoke with pt via telephone to inform pt that the Tempus Report for the HER2 & PDL-1 were done but came back negative.  Pt stated she was aware of the results of the Tempus.  Stated that the remaining report for Tempus could not be done d/t both tissue samples were insufficient; therefore, Dr Lanny ordered a Guardant 360 instead.  Pt stated she remember the MyChart conversation between she and this nurse regarding this information.  Pt stated she fully understands that the Tempus was incomplete d/t insufficient tissue; therefore, Guardant 360 was done instead.  Pt had no further questions or concerns at this time.

## 2024-08-19 NOTE — Progress Notes (Signed)
 [facility]   [phone] Fax:([phone]) [phone]   Clinic Follow up Note   Patient Care Team: [name], MD as PCP - General (Family Medicine) [name], MD as PCP - Cardiology (Cardiology) [name], MD as Consulting Physician (Cardiology) [name], MD as Consulting Physician (Ophthalmology) [name], PA-C (Inactive) as Physician Assistant (Dermatology) [name], MD as Consulting Physician (General Surgery) [name], MD as Consulting Physician (Hematology) [name], MD as Attending Physician (Radiation Oncology) [name], [facility] (Inactive) (Pharmacist)  Date of Service:  08/19/2024  CHIEF COMPLAINT: f/u of metastatic breast cancer  CURRENT THERAPY:  Pending low-dose tamoxifen   Oncology History   Malignant neoplasm of overlapping sites of left breast in female, estrogen receptor positive (HCC) Stage IA, pT2, cN0, ER+/PR+/HER2-, Grade 2, RS 21, malignant pleural effusion and diffuse bone metastasis in 12/2023, 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 Biopsy 08/11/21 confirmed invasive mammary carcinoma. -left lumpectomy on 09/09/21 by Dr. 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 No nodes were removed due to her age. -she received adjuvant RT, completed in 11/2021 -she started adjuvant tamoxifen  in 12/2021, but could not tolerate. She subsequently tried anastrozole  and still can not tolerate. She stopped in 04/2022 -Screening mammogram in May 2024 were negative. - She developed pleural effusion in April 2025, cytology unfortunately showed malignant cells, consistent with breast primary. ERand PR moderately positive, Her2 negative (IHC 0) -FDG PET 01/08/2024 was negative for hypermetabolic lesion or hypermetabolism around the pleura.  [place]  PET scan 01/26/2024 showed diffuse bone mets  and mediastinal adenopathy. Pleura was negative on PET -her skin (scalp) biopsy also showed metastatic breast cancer, ER and PR positive, HER2 negative.   -She has started anastrozole  in 12/2023  - Due to her recurrent pleural effusion, and high disease burden, I will add a low-dose Ibrance  for better disease control - Continue Zometa , will change to every 3 months due to her diffuse bone metastasis. - Due to severe joint pain especially in her knees, she stopped anastrozole  and Ibrance  in early July.  Assessment & Plan Metastatic breast cancer with bone metastases Significant improvement in bone metastases on recent PET imaging compared to six months ago. Previous treatment with anastrozole  and Ibrance  was not well tolerated due to fatigue and leukopenia. Current plan is to manage with observation or low-dose anti-estrogen therapy. Tamoxifen  is considered due to less joint pain compared to anastrozole  and her hysterectomy status, which reduces the risk of endometrial cancer. Discussed potential side effects of tamoxifen , including hot flushes, fatigue, and a slightly increased risk of blood clots, especially at full dose. She is open to trying low-dose tamoxifen . - Prescribed tamoxifen  10 mg daily, with option to start at 5 mg if tolerated. - Continue observation of disease progression. - Scheduled thoracentesis as needed. - Continue Guardian 360 testing annually to monitor for ESR1 mutation.  Vitamin B12 deficiency anemia Vitamin B12 levels were checked in November and were within normal range. She is currently taking prenatal vitamins containing B12. - Will administer B12 injection with Zemaira infusion every three months.  Bone metastasis  She is due for her next Zemaira infusion, which is administered every three months. - Scheduled Zemaira infusion for next week.  Single thyroid  nodule suspicious for malignancy Thyroid  nodule classified as suspicious with a 50% chance of malignancy.  Previous biopsy showed atypical  cells but no definitive malignancy. She has breast cancer, which is more pressing than the thyroid  nodule. - Referred to endocrinologist for further evaluation of thyroid  nodule.  Severe tricuspid valve regurgitation Previously evaluated by cardiologist. She is considering a minimally invasive procedure if recommended by cardiologist. Discussed potential for valve repair if life expectancy is more than a year, which is estimated to be the case. - Consult cardiologist regarding potential valve repair.  Anxiety disorder She experiences anxiety, particularly at night, and uses Xanax  as needed. Current prescription is outdated, and she requests a new prescription. - Prescribed Xanax  0.5 mg as needed, up to once daily, preferably at night.  Plan - I personally reviewed the her restaging PET scan images with patient and her daughter, and discussed the findings with her.  She has had a good response for short period of treatment. - I encouraged her to to consider low-dose tamoxifen , she is agreeable.  I called in tamoxifen  10 mg daily, she will start at the 5 mg daily for first month - Continue Zometa  and B12 every 3 months, will schedule her next in 1 week - Follow-up in 3 months with lab and Zometa /B12 -She is scheduled for left thoracentesis next week, I placed another order of thoracentesis for future - Patient had multiple questions, I answered all - Endocrine referral for thyroid  nodule follow-up   SUMMARY OF ONCOLOGIC HISTORY: Oncology History Overview Note   Cancer Staging  Malignant neoplasm of overlapping sites of left breast in female, estrogen receptor positive (HCC) Staging form: Breast, AJCC 8th Edition - Clinical stage from 08/11/2021: Stage IA (cT1c, cN0, cM0, G2, ER+, PR+, HER2-) - Signed by [name], MD on 08/18/2021     Malignant neoplasm of overlapping sites of left breast in female, estrogen receptor positive (HCC)  07/27/2021 Mammogram    EXAM: DIGITAL DIAGNOSTIC UNILATERAL LEFT MAMMOGRAM WITH TOMOSYNTHESIS AND CAD; ULTRASOUND LEFT BREAST LIMITED  IMPRESSION: 1. There is a suspicious mass in the left breast at 9 o'clock measuring 1.8 cm.   2. There is a 1.0 cm group of suspicious linear calcifications anterior to the suspicious mass in the left breast, as well as a faint 2 mm group which lie 1 cm lateral to the linear calcifications.   3.  No evidence of left axillary lymphadenopathy   08/11/2021 Cancer Staging   Staging form: Breast, AJCC 8th Edition - Clinical stage from 08/11/2021: Stage IA (cT1c, cN0, cM0, G2, ER+, PR+, HER2-) - Signed by [name], MD on 08/18/2021 Stage prefix: Initial diagnosis Histologic grading system: 3 grade system   08/11/2021 Initial Biopsy   Diagnosis 1. Breast, left, needle core biopsy, 9 o'clock, ribbon clip - INVASIVE MAMMARY CARCINOMA - SEE COMMENT 2. Breast, left, needle core biopsy, lower inner quadrant, x clip - INVASIVE MAMMARY CARCINOMA - MAMMARY CARCINOMA IN-SITU - CALCIFICATIONS - SEE COMMENT Microscopic Comment 1. The biopsy material shows an infiltrative proliferation of cells with arranged linearly and in small clusters. Based on the biopsy, the carcinoma appears Nottingham grade 2 of 3 and measures 1.2 cm in greatest linear extent. 2. Based on the biopsy, the carcinoma appears Nottingham grade 2 of 3 and measures 0.2 cm in greatest linear extent.  1. PROGNOSTIC INDICATORS Results: The tumor cells are NEGATIVE for Her2 (1+). Estrogen Receptor: 100%, POSITIVE, STRONG STAINING INTENSITY Progesterone Receptor: 60%, POSITIVE, STRONG STAINING INTENSITY Proliferation Marker Ki67: 10%  2. PROGNOSTIC INDICATORS Results: The tumor cells are NEGATIVE for Her2 (1+). Estrogen Receptor: 100%, POSITIVE, STRONG STAINING INTENSITY  Progesterone Receptor: 60%, POSITIVE, STRONG STAINING INTENSITY Proliferation Marker Ki67: 15%   08/13/2021 Mammogram   EXAM: DIGITAL DIAGNOSTIC  UNILATERAL RIGHT MAMMOGRAM WITH TOMOSYNTHESIS AND CAD  IMPRESSION: No mammographic evidence for malignancy.   08/16/2021 Initial Diagnosis   Malignant neoplasm of overlapping sites of left breast in female, estrogen receptor positive (HCC)   08/25/2021 Imaging   EXAM: BILATERAL BREAST MRI WITH AND WITHOUT CONTRAST  IMPRESSION: 1. Biopsy-proven invasive ductal carcinoma measuring approximately 2.8 x 1.7 x 1.5 cm in the inner breast at middle depth, associated with the ribbon shaped tissue marking clip placed at the time of core needle biopsy. Enhancement extends approximately 1.4 cm posterior to the clip. 2. Approximate 1.8 cm post biopsy hematoma with associated rim enhancement at the site of the biopsy-proven invasive ductal carcinoma and DCIS in the LOWER INNER QUADRANT at anterior depth associated with the X shaped tissue marking clip. 3. In combination, the overall enhancement spans approximately 4 cm. 4. No MRI evidence of malignancy involving the RIGHT breast. 5. No pathologic lymphadenopathy.   08/27/2021 Genetic Testing   egative hereditary cancer genetic testing: no pathogenic variants detected in [facility] BRCAPlus Panel and CancerNext-Expanded +RNAinsight Panel.  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  The report dates are 08/27/2021 and 08/30/2021.   The BRCAplus panel offered by [employer] and includes sequencing and deletion/duplication analysis for the following 8 genes: ATM, BRCA1, BRCA2, CDH1, CHEK2, PALB2, PTEN, and TP53.  The CancerNext-Expanded gene panel offered by [facility] and includes sequencing, rearrangement, and RNA analysis for the following 77 genes: AIP, ALK, APC, ATM, AXIN2, BAP1, BARD1, BLM, BMPR1A, BRCA1, BRCA2, BRIP1, CDC73, CDH1, CDK4, CDKN1B, CDKN2A, CHEK2, CTNNA1, DICER1, FANCC, FH, FLCN, GALNT12, KIF1B, LZTR1, MAX, MEN1, MET, MLH1, MSH2, MSH3, MSH6, MUTYH, NBN, NF1, NF2, NTHL1, PALB2, PHOX2B, PMS2, POT1, PRKAR1A,  PTCH1, PTEN, RAD51C, RAD51D, RB1, RECQL, RET, SDHA, SDHAF2, SDHB, SDHC, SDHD, SMAD4, SMARCA4, SMARCB1, SMARCE1, STK11, SUFU, TMEM127, TP53, TSC1, TSC2, VHL and XRCC2 (sequencing and deletion/duplication); EGFR, EGLN1, HOXB13, KIT, MITF, PDGFRA, POLD1, and POLE (sequencing only); EPCAM and GREM1 (deletion/duplication only).    09/09/2021 Definitive Surgery   FINAL MICROSCOPIC DIAGNOSIS:   A. BREAST, LEFT, LUMPECTOMY:  -  Invasive ductal carcinoma, Nottingham grade 2 of 3, 3.5 cm  -  Ductal carcinoma in-situ, intermediate grade  -  Margins uninvolved by carcinoma (<0.1 cm; anterior)  -  Previous biopsy site changes present    09/09/2021 Oncotype testing   Oncotype DX was obtained on the final surgical sample and the recurrence score of 21 predicts a risk of recurrence outside the breast over the next 9 years of 7%, if the patient's only systemic therapy is an antiestrogen for 5 years.  It also predicts no benefit from chemotherapy.   10/20/2021 - 11/16/2021 Radiation Therapy   Site Technique Total Dose (Gy) Dose per Fx (Gy) Completed Fx Beam Energies  Breast, Left: Breast_L_axilla 3D 40.05/40.05 2.67 15/15 10X  Breast, Left: Breast_L_Bst 3D 10/10 2 5/5 6X, 10X     12/06/2021 - 04/2022 Anti-estrogen oral therapy   Tamoxifen  x 5 years unable to tolerate, attempted anastrozole  and had similar side effects.  Opted to forego adjuvant antiestrogen therapy      Discussed the use of AI scribe software for clinical note transcription with the patient, who gave verbal consent to proceed.  History of Present Illness Madison Clements is an 78 year old female with metastatic breast cancer who presents for follow-up.  She has  nighttime discomfort described as pressure between her shoulder blades, not sharp but bothersome enough that she sometimes sleeps in a recliner, which helps. She uses Tylenol  Arthritis for pain and occasionally takes an old prescription of Xanax  for anxiety and  insomnia.  She previously took anastrozole  for about a month, then Ibrance  for about 20 days. Ibrance  caused a marked drop in white blood cell count and was stopped. She also had significant fatigue on anastrozole  alone.  She had biopsy-proven breast cancer involvement in scalp bumps that have resolved, though the area remains sensitive. Genetic testing was attempted again but the sample was inadequate. Prior genetic testing was negative.  She has had spontaneous bloody spots on her skin. Prior evaluation showed normal platelets. She does not use aspirin , Plavix, or other blood thinners. She also has severe tricuspid valve regurgitation that is not currently being treated.  She is not currently scheduled for Zemaira or B12 infusions and believes she is due. Her last Zemaira infusion was in September. She takes a prenatal vitamin with B12. She denies cough, chest pain, and epistaxis.     All other systems were reviewed with the patient and are negative.  MEDICAL HISTORY:  Past Medical History:  Diagnosis Date   Allergy 1990   Morphine following surgery   Arthritis    generalized   Atypical mole 02/24/2015   RGHT LATERAL THIGH MODERATE   Atypical mole 01/20/2022   Left Breast (mild)   Atypical mole 01/20/2022   Mid Back (mild)   Atypical nevi 02/24/2015   RIGHT NECK MILD   Atypical nevi 08/25/2015   LEFT UPPER ARM MILD   Atypical nevi 08/25/2015   LEFT LATERAL FOREARM MILD/FREE   Atypical nevi 02/23/2016   LEFT THIGH MODERATE/FREE   Atypical nevi 02/23/2016   LEFT MEDIAL LEG MODERATE/FREE   Atypical nevi 02/23/2016   LEFT UPPER BACK MILD /FREE   Atypical nevi 08/23/2016   RIGHT ANT PROX THIGH MILD /FREE   Atypical nevi 07/25/2018   MID LOWER BACK MODERATE   Atypical nevi 01/22/2019   LEFT MID BACK MILD /FREE   Atypical nevi 01/22/2019   LEFT NECK MILD   Atypical nevi 08/06/2019   LEFT INNER BREAST MODERATE W/S   Atypical nevi 08/06/2019   LEFT OUTER SIDE MILD/FREE    Blood transfusion without reported diagnosis    hx of   Breast cancer (HCC) 2022   CAD (coronary artery disease), native coronary artery 02/07/2019   Minimal CAD at the time of cath in 2016 in [place]    Family history of breast cancer 08/19/2021   Family history of pancreatic cancer 08/19/2021   Fatty liver 05/21/2020   Gastric polyps 10/02/2018   EGD 03/2015; benign   GERD (gastroesophageal reflux disease) 10/02/2018   on meds   History of melanoma 10/02/2018   Abdomen 2012; 2013; s/p local excisions.    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 DISTAL LOWER LEG TREATED BY DR. GEORGENE   Sleep apnea    Sleep apnea 05/26/2020   uses CPAP  Tricuspid regurgitation    moderate by echo 04/2023    SURGICAL HISTORY: Past Surgical History:  Procedure Laterality Date   ABDOMINAL HYSTERECTOMY  1990   APPENDECTOMY  1962   BLADDER SUSPENSION  1990   BREAST LUMPECTOMY Left 08/2021   BREAST LUMPECTOMY WITH RADIOACTIVE SEED LOCALIZATION Left 09/09/2021   Procedure: LEFT BREAST LUMPECTOMY WITH RADIOACTIVE SEED LOCALIZATION X2;  Surgeon: [name], MD;  Location: [facility];  Service: General;  Laterality: Left;   COLONOSCOPY  2016   in [place]-hx of polyps   EYE SURGERY  2013 torn retina laser   IR THORACENTESIS ASP PLEURAL SPACE W/IMG GUIDE  02/07/2024   IR THORACENTESIS ASP PLEURAL SPACE W/IMG GUIDE  04/12/2024   IR THORACENTESIS ASP PLEURAL SPACE W/IMG GUIDE  05/09/2024   IR THORACENTESIS ASP PLEURAL SPACE W/IMG GUIDE  06/07/2024   IR THORACENTESIS ASP PLEURAL SPACE W/IMG  GUIDE  07/12/2024   MENISCUS REPAIR Right 2001   right knee   RIGHT HEART CATH AND CORONARY ANGIOGRAPHY N/A 06/20/2023   Procedure: RIGHT HEART CATH AND CORONARY ANGIOGRAPHY;  Surgeon: Rolan Ezra RAMAN, MD;  Location: [facility] INVASIVE CV LAB;  Service: Cardiovascular;  Laterality: N/A;   thumb surgery Left    TONSILLECTOMY AND ADENOIDECTOMY     TRANSESOPHAGEAL ECHOCARDIOGRAM (CATH LAB) N/A 01/04/2024   Procedure: TRANSESOPHAGEAL ECHOCARDIOGRAM;  Surgeon: Santo Stanly LABOR, MD;  Location: [facility] INVASIVE CV LAB;  Service: Cardiovascular;  Laterality: N/A;   tuabl ligation     TUBAL LIGATION  1975   WISDOM TOOTH EXTRACTION      I have reviewed the social history and family history with the patient and they are unchanged from previous note.  ALLERGIES:  is allergic to morphine, metoprolol , nebivolol , and morphine and codeine.  MEDICATIONS:  Current Outpatient Medications  Medication Sig Dispense Refill   diazepam  (VALIUM ) 5 MG tablet Take 1 tablet (5 mg total) by mouth as needed for anxiety. Before any invasive procedures or scan 10 tablet 0   diltiazem  (CARDIZEM  CD) 300 MG 24 hr capsule TAKE 1 CAPSULE EVERY DAY 90 capsule 2   ezetimibe  (ZETIA ) 10 MG tablet Take 1 tablet (10 mg total) by mouth daily. 90 tablet 3   losartan  (COZAAR ) 25 MG tablet Take 1 tablet (25 mg total) by mouth daily. 90 tablet 3   omeprazole  (PRILOSEC) 20 MG capsule TAKE 1 CAPSULE EVERY DAY 90 capsule 3   ondansetron  (ZOFRAN ) 8 MG tablet Take 1 tablet (8 mg total) by mouth every 8 (eight) hours as needed for nausea or vomiting. 20 tablet 2   Prenatal Vit-Fe Fumarate-FA (PRENATAL VITAMIN PO) Take by mouth daily. Prenatal w/folic acid po daily     Probiotic Product (ADVANCED PROBIOTIC PO) Take 1 capsule by mouth daily.     rosuvastatin  (CRESTOR ) 20 MG tablet Take 1 tablet (20 mg total) by mouth daily. 90 tablet 3   tamoxifen  (NOLVADEX ) 10 MG tablet Take 1 tablet (10 mg total) by mouth daily. 30 tablet 0   ALPRAZolam   (XANAX ) 0.5 MG tablet Take 1 tablet (0.5 mg total) by mouth at bedtime as needed for anxiety. Take 15-30 minutes prior to the procedure 30 tablet 0   No current facility-administered medications for this visit.    PHYSICAL EXAMINATION: ECOG PERFORMANCE STATUS: 1 - Symptomatic but completely ambulatory  Vitals:   08/19/24 1000 08/19/24 1051  BP: (!) 156/70 (!) 155/66  Pulse: 77 74  Resp: 16   Temp: 97.8 F (36.6 C)   SpO2: 95%  97%   Wt Readings from Last 3 Encounters:  08/19/24 150 lb 12.8 oz (68.4 kg)  07/15/24 151 lb 3.2 oz (68.6 kg)  06/11/24 151 lb 11.2 oz (68.8 kg)     GENERAL:alert, no distress and comfortable SKIN: skin color, texture, turgor are normal, no rashes or significant lesions EYES: normal, Conjunctiva are pink and non-injected, sclera clear Musculoskeletal:no cyanosis of digits and no clubbing  NEURO: alert & oriented x 3 with fluent speech, no focal motor/sensory deficits  Physical Exam   LABORATORY DATA:  I have reviewed the data as listed    Latest Ref Rng & Units 08/12/2024   11:07 AM 07/26/2024   11:13 AM 06/11/2024    8:13 AM  CBC  WBC 4.0 - 10.5 K/uL 5.9  5.6  4.3   Hemoglobin 12.0 - 15.0 g/dL 86.0  85.6  86.3   Hematocrit 36.0 - 46.0 % 39.9  40.9  39.0   Platelets 150 - 400 K/uL 252  223  221         Latest Ref Rng & Units 08/12/2024   11:07 AM 08/06/2024   10:52 AM 06/11/2024    8:13 AM  CMP  Glucose 70 - 99 mg/dL 895  96  94   BUN 8 - 23 mg/dL 16  15  15    Creatinine 0.44 - 1.00 mg/dL 9.12  9.14  8.97   Sodium 135 - 145 mmol/L 138  136  139   Potassium 3.5 - 5.1 mmol/L 4.3  4.5  3.9   Chloride 98 - 111 mmol/L 104  103  108   CO2 22 - 32 mmol/L 23  19  24    Calcium  8.9 - 10.3 mg/dL 9.7  89.9  9.4   Total Protein 6.5 - 8.1 g/dL 7.0   6.5   Total Bilirubin 0.0 - 1.2 mg/dL 0.9   0.7   Alkaline Phos 38 - 126 U/L 75   63   AST 15 - 41 U/L 37   22   ALT 0 - 44 U/L 41   26       RADIOGRAPHIC STUDIES: I have personally reviewed the  radiological images as listed and agreed with the findings in the report. No results found.    Orders Placed This Encounter  Procedures   IR THORACENTESIS ASP PLEURAL SPACE W/IMG GUIDE    Standing Status:   Future    Expiration Date:   08/19/2025    Are labs required for specimen collection?:   No    Laterality::   Left    Reason for Exam (SYMPTOM  OR DIAGNOSIS REQUIRED):   symptom relieve    Preferred Imaging Location?:   [facility]   Vitamin B12    Standing Status:   Standing    Number of Occurrences:   10    Expiration Date:   08/19/2025   Ambulatory referral to Endocrinology    Referral Priority:   Routine    Referral Type:   Consultation    Referral Reason:   Specialty Services Required    Number of Visits Requested:   1   All questions were answered. The patient knows to call the clinic with any problems, questions or concerns. No barriers to learning was detected. The total time spent in the appointment was 40 minutes, including review of chart and various tests results, discussions about plan of care and coordination of care plan     Onita Mattock, MD 08/19/2024

## 2024-08-20 ENCOUNTER — Encounter (INDEPENDENT_AMBULATORY_CARE_PROVIDER_SITE_OTHER): Payer: Medicare Other | Admitting: Ophthalmology

## 2024-08-20 DIAGNOSIS — H35033 Hypertensive retinopathy, bilateral: Secondary | ICD-10-CM

## 2024-08-20 DIAGNOSIS — H2512 Age-related nuclear cataract, left eye: Secondary | ICD-10-CM

## 2024-08-20 DIAGNOSIS — H33301 Unspecified retinal break, right eye: Secondary | ICD-10-CM | POA: Diagnosis not present

## 2024-08-20 DIAGNOSIS — I10 Essential (primary) hypertension: Secondary | ICD-10-CM | POA: Diagnosis not present

## 2024-08-20 DIAGNOSIS — H43813 Vitreous degeneration, bilateral: Secondary | ICD-10-CM | POA: Diagnosis not present

## 2024-08-21 ENCOUNTER — Ambulatory Visit (HOSPITAL_COMMUNITY)
Admission: RE | Admit: 2024-08-21 | Discharge: 2024-08-21 | Disposition: A | Discharge status: Home / Self Care | Source: Ambulatory Visit | Attending: Hematology | Admitting: Hematology

## 2024-08-21 ENCOUNTER — Ambulatory Visit (HOSPITAL_COMMUNITY)
Admission: RE | Admit: 2024-08-21 | Discharge: 2024-08-21 | Disposition: A | Discharge status: Home / Self Care | Source: Ambulatory Visit | Attending: Urology | Admitting: Urology

## 2024-08-21 DIAGNOSIS — J90 Pleural effusion, not elsewhere classified: Secondary | ICD-10-CM | POA: Diagnosis present

## 2024-08-21 DIAGNOSIS — Z853 Personal history of malignant neoplasm of breast: Secondary | ICD-10-CM | POA: Diagnosis not present

## 2024-08-21 DIAGNOSIS — J910 Malignant pleural effusion: Secondary | ICD-10-CM

## 2024-08-21 HISTORY — PX: IR THORACENTESIS ASP PLEURAL SPACE W/IMG GUIDE: IMG5380

## 2024-08-21 MED ORDER — LIDOCAINE-EPINEPHRINE 1 %-1:100000 IJ SOLN
INTRAMUSCULAR | Status: AC
  Filled 2024-08-21: qty 1

## 2024-08-21 MED ORDER — LIDOCAINE-EPINEPHRINE 1 %-1:100000 IJ SOLN
20.0000 mL | Freq: Once | INTRAMUSCULAR | Status: AC
  Administered 2024-08-21: 10 mL

## 2024-08-21 NOTE — Procedures (Signed)
 PROCEDURE SUMMARY:  Successful image-guided left-sided therapeutic thoracentesis. Yielded 0.650 liters of clear, straw-colored pleural fluid. Patient tolerated procedure well. EBL: Zero No immediate complications.  Post procedure CXR shows no pneumothorax.  Please see imaging section of Epic for full dictation.  Carlin LABOR Marengo Cohick PA-C 08/21/2024 10:53 AM

## 2024-08-26 ENCOUNTER — Inpatient Hospital Stay

## 2024-08-26 VITALS — BP 142/55 | HR 67 | Temp 98.0°F | Resp 16 | Wt 154.4 lb

## 2024-08-26 DIAGNOSIS — C50812 Malignant neoplasm of overlapping sites of left female breast: Secondary | ICD-10-CM | POA: Diagnosis not present

## 2024-08-26 DIAGNOSIS — M8580 Other specified disorders of bone density and structure, unspecified site: Secondary | ICD-10-CM

## 2024-08-26 DIAGNOSIS — D649 Anemia, unspecified: Secondary | ICD-10-CM

## 2024-08-26 DIAGNOSIS — Z170 Estrogen receptor positive status [ER+]: Secondary | ICD-10-CM

## 2024-08-26 DIAGNOSIS — D519 Vitamin B12 deficiency anemia, unspecified: Secondary | ICD-10-CM

## 2024-08-26 LAB — COMPREHENSIVE METABOLIC PANEL WITH GFR
ALT: 50 U/L — ABNORMAL HIGH (ref 0–44)
AST: 33 U/L (ref 15–41)
Albumin: 4.2 g/dL (ref 3.5–5.0)
Alkaline Phosphatase: 66 U/L (ref 38–126)
Anion gap: 11 (ref 5–15)
BUN: 15 mg/dL (ref 8–23)
CO2: 22 mmol/L (ref 22–32)
Calcium: 9.4 mg/dL (ref 8.9–10.3)
Chloride: 107 mmol/L (ref 98–111)
Creatinine, Ser: 1 mg/dL (ref 0.44–1.00)
GFR, Estimated: 57 mL/min — ABNORMAL LOW (ref >60)
Glucose, Bld: 96 mg/dL (ref 70–99)
Potassium: 4.8 mmol/L (ref 3.5–5.1)
Sodium: 140 mmol/L (ref 135–145)
Total Bilirubin: 0.5 mg/dL (ref 0.0–1.2)
Total Protein: 6.5 g/dL (ref 6.5–8.1)

## 2024-08-26 LAB — CBC WITH DIFFERENTIAL (CANCER CENTER ONLY)
Abs Immature Granulocytes: 0.04 K/uL (ref 0.00–0.07)
Basophils Absolute: 0 K/uL (ref 0.0–0.1)
Basophils Relative: 0 %
Eosinophils Absolute: 0.1 K/uL (ref 0.0–0.5)
Eosinophils Relative: 1 %
HCT: 38.6 % (ref 36.0–46.0)
Hemoglobin: 13.3 g/dL (ref 12.0–15.0)
Immature Granulocytes: 1 %
Lymphocytes Relative: 19 %
Lymphs Abs: 1.2 K/uL (ref 0.7–4.0)
MCH: 33.7 pg (ref 26.0–34.0)
MCHC: 34.5 g/dL (ref 30.0–36.0)
MCV: 97.7 fL (ref 80.0–100.0)
Monocytes Absolute: 0.6 K/uL (ref 0.1–1.0)
Monocytes Relative: 9 %
Neutro Abs: 4.5 K/uL (ref 1.7–7.7)
Neutrophils Relative %: 70 %
Platelet Count: 265 K/uL (ref 150–400)
RBC: 3.95 MIL/uL (ref 3.87–5.11)
RDW: 12.8 % (ref 11.5–15.5)
WBC Count: 6.4 K/uL (ref 4.0–10.5)
nRBC: 0.3 % — ABNORMAL HIGH (ref 0.0–0.2)

## 2024-08-26 LAB — VITAMIN B12: Vitamin B-12: 596 pg/mL (ref 180–914)

## 2024-08-26 MED ORDER — CYANOCOBALAMIN 1000 MCG/ML IJ SOLN
1000.0000 ug | Freq: Once | INTRAMUSCULAR | Status: AC
  Administered 2024-08-26: 12:00:00 1000 ug via INTRAMUSCULAR
  Filled 2024-08-26: qty 1

## 2024-08-26 MED ORDER — ZOLEDRONIC ACID 4 MG/100ML IV SOLN
4.0000 mg | Freq: Once | INTRAVENOUS | Status: AC
  Administered 2024-08-26: 12:00:00 4 mg via INTRAVENOUS
  Filled 2024-08-26: qty 100

## 2024-08-26 MED ORDER — SODIUM CHLORIDE 0.9 % IV SOLN: INTRAVENOUS | Status: DC

## 2024-08-26 NOTE — Patient Instructions (Signed)

## 2024-08-27 ENCOUNTER — Other Ambulatory Visit: Payer: Self-pay | Admitting: Cardiology

## 2024-08-28 MED ORDER — LOSARTAN POTASSIUM 25 MG PO TABS: 25.0000 mg | ORAL_TABLET | Freq: Every day | ORAL | 0 refills | Status: DC

## 2024-09-06 ENCOUNTER — Telehealth: Payer: Self-pay

## 2024-09-06 NOTE — Telephone Encounter (Signed)
 Madison Clements

## 2024-09-09 ENCOUNTER — Other Ambulatory Visit: Payer: Self-pay | Admitting: Hematology

## 2024-09-10 ENCOUNTER — Encounter: Payer: Self-pay | Admitting: Hematology

## 2024-09-13 ENCOUNTER — Encounter: Payer: Self-pay | Admitting: Cardiology

## 2024-09-13 ENCOUNTER — Ambulatory Visit: Attending: Cardiology | Admitting: Cardiology

## 2024-09-13 VITALS — BP 140/60 | HR 100 | Ht 62.0 in | Wt 152.0 lb

## 2024-09-13 DIAGNOSIS — I4719 Other supraventricular tachycardia: Secondary | ICD-10-CM | POA: Insufficient documentation

## 2024-09-13 DIAGNOSIS — I10 Essential (primary) hypertension: Secondary | ICD-10-CM | POA: Diagnosis present

## 2024-09-13 DIAGNOSIS — R0600 Dyspnea, unspecified: Secondary | ICD-10-CM | POA: Insufficient documentation

## 2024-09-13 DIAGNOSIS — I071 Rheumatic tricuspid insufficiency: Secondary | ICD-10-CM | POA: Insufficient documentation

## 2024-09-13 DIAGNOSIS — E785 Hyperlipidemia, unspecified: Secondary | ICD-10-CM | POA: Insufficient documentation

## 2024-09-13 DIAGNOSIS — I272 Pulmonary hypertension, unspecified: Secondary | ICD-10-CM | POA: Insufficient documentation

## 2024-09-13 DIAGNOSIS — G4733 Obstructive sleep apnea (adult) (pediatric): Secondary | ICD-10-CM | POA: Diagnosis present

## 2024-09-13 DIAGNOSIS — I2510 Atherosclerotic heart disease of native coronary artery without angina pectoris: Secondary | ICD-10-CM | POA: Insufficient documentation

## 2024-09-13 MED ORDER — DILTIAZEM HCL ER COATED BEADS 360 MG PO CP24: 360.0000 mg | ORAL_CAPSULE | Freq: Every day | ORAL | 3 refills | Status: AC

## 2024-09-13 NOTE — Addendum Note (Signed)
 Addended by: JANIT GENI CROME on: 09/13/2024 08:59 AM   Modules accepted: Orders

## 2024-09-13 NOTE — Patient Instructions (Addendum)
 Medication Instructions:  INCREASE: Cardizem  CD (Diltiazem ) to 360 mg daily  *If you need a refill on your cardiac medications before your next appointment, please call your pharmacy*  Lab Work: None.  If you have labs (blood work) drawn today and your tests are completely normal, you will receive your results only by: MyChart Message (if you have MyChart) OR A paper copy in the mail If you have any lab test that is abnormal or we need to change your treatment, we will call you to review the results.  Testing/Procedures: Your physician has requested that you have an echocardiogram. Echocardiography is a painless test that uses sound waves to create images of your heart. It provides your doctor with information about the size and shape of your heart and how well your hearts chambers and valves are working. This procedure takes approximately one hour. There are no restrictions for this procedure. Please do NOT wear cologne, perfume, aftershave, or lotions (deodorant is allowed). Please arrive 15 minutes prior to your appointment time.  Please note: We ask at that you not bring children with you during ultrasound (echo/ vascular) testing. Due to room size and safety concerns, children are not allowed in the ultrasound rooms during exams. Our front office staff cannot provide observation of children in our lobby area while testing is being conducted. An adult accompanying a patient to their appointment will only be allowed in the ultrasound room at the discretion of the ultrasound technician under special circumstances. We apologize for any inconvenience.   Follow-Up: At [facility], you and your health needs are our priority.  As part of our continuing mission to provide you with exceptional heart care, our providers are all part of one team.  This team includes your primary Cardiologist (physician) and Advanced Practice Providers or APPs (Physician Assistants and Nurse Practitioners)  who all work together to provide you with the care you need, when you need it.  Your next appointment:   6 month(s)  Provider:   Wilbert Bihari, MD     Other Instructions Check blood pressure 2 times daily for 2 weeks and call with reading or send via MyChart

## 2024-09-13 NOTE — Progress Notes (Addendum)
 " Cardiology Office Note:    Date:  09/13/2024   ID:  Madison Clements, DOB [birth year], MRN [id number]  PCP:  [name], MD  Cardiologist:  Wilbert Bihari, MD    Referring MD: [name], MD   Chief Complaint  Patient presents with   Coronary Artery Disease   Hypertension   Sleep Apnea   Hyperlipidemia     History of Present Illness:    Madison Clements is a 79 y.o. female with a hx of  hyperlipidemia, SVT/PACs/PVCs followed by EP, GERD, ASCAD, hypertension and pulmonary hypertension.  She had an echocardiogram done and 2018 in [place]  which showed normal LV function with the EF 55 to 60%, grade 2 diastolic dysfunction with mild to moderate TR and mild pulmonary hypertension with RVSP 39 mmHg.  She has been on Lasix  40 mg daily. Cardiac catheterization done in 2016 showed mild CAD with calcified vessels.    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  2D echo done 04/05/2022 showed normal LV function with EF 60 to 65% and 1 diastolic dysfunction, mild to moderate TR and normal RVSP at 29 mmHg.   Her last echo done 05/11/2023 showed EF 60 to 65% and normal RV size and function but severe TR.  PA systolic pressure could not be estimated because of the eccentricity of the TR jet.  She had a home sleep study done showing moderate OSA with AHI 18/hr and O2 sats as low as 72%.  She became very frustrated with her CPAP and wanted to be evaluated for the inspire device and was referred to ENT and felt to be a good candidate for inspire and was scheduled for a sleep induced endoscopy.  Unfortunately she was diagnosed with breast cancer in the interim and the sleep induced endoscopy was postponed.  She underwent lumpectomy on 09/09/2021 s/p XRT.  She decided to stay on CPAP for now.  Unfortunately she was dx with metastatic breast Ca and has malignant  pleural effusions and gets thoracentesis monthly.   She is here today for follow-up.  She has been having a lot of SOB and fatigue as well as LE edema..  Her SOB improves with thoracentesis.  She also notices that her heart races when she is exerting herself  .  She denies any chest pain or pressure, PND, orthopnea, dizziness, palpitations or syncope.  She has chronic LE edema controlled on PRN lasix  that she uses on 3 times monthly.  She has not been using her PAP very much due to all that has been going on with her metastatic breast Ca.  She tolerates the full face mask and feels the pressure is adequate.  She feels rested in the am and has no significant daytime sleepiness.  She denies any significant mouth or nasal dryness or nasal congestion.  She does not think that he snores. An Epworth Sleepiness Scale score was calculated the office today and this endorsed at 1 arguing against residual daytime sleepiness. Patient denies any episodes of bruxism, restless legs, No hypnogognic hallucinations or cataplectic events.    Past Medical History:  Diagnosis Date   Allergy 1990   Morphine following surgery   Arthritis    generalized   Atypical mole 02/24/2015   RGHT LATERAL THIGH MODERATE   Atypical mole 01/20/2022   Left Breast (mild)   Atypical mole 01/20/2022  Mid Back (mild)   Atypical nevi 02/24/2015   RIGHT NECK MILD   Atypical nevi 08/25/2015   LEFT UPPER ARM MILD   Atypical nevi 08/25/2015   LEFT LATERAL FOREARM MILD/FREE   Atypical nevi 02/23/2016   LEFT THIGH MODERATE/FREE   Atypical nevi 02/23/2016   LEFT MEDIAL LEG MODERATE/FREE   Atypical nevi 02/23/2016   LEFT UPPER BACK MILD /FREE   Atypical nevi 08/23/2016   RIGHT ANT PROX THIGH MILD /FREE   Atypical nevi 07/25/2018   MID LOWER BACK MODERATE   Atypical nevi 01/22/2019   LEFT MID BACK MILD /FREE   Atypical nevi 01/22/2019   LEFT NECK MILD   Atypical nevi 08/06/2019   LEFT INNER BREAST MODERATE W/S   Atypical nevi  08/06/2019   LEFT OUTER SIDE MILD/FREE   Blood transfusion without reported diagnosis    hx of   Breast cancer (HCC) 2022   CAD (coronary artery disease), native coronary artery 02/07/2019   Minimal CAD at the time of cath in 2016 in [place]    Family history of breast cancer 08/19/2021   Family history of pancreatic cancer 08/19/2021   Fatty liver 05/21/2020   Gastric polyps 10/02/2018   EGD 03/2015; benign   GERD (gastroesophageal reflux disease) 10/02/2018   on meds   History of melanoma 10/02/2018   Abdomen 2012; 2013; s/p local excisions.    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 DISTAL LOWER LEG TREATED BY DR. GEORGENE   Sleep apnea    Sleep apnea 05/26/2020   uses CPAP   Tricuspid regurgitation    moderate by echo 04/2023    Past Surgical History:  Procedure Laterality Date   ABDOMINAL HYSTERECTOMY  1990   APPENDECTOMY  1962   BLADDER SUSPENSION  1990   BREAST LUMPECTOMY Left 08/2021   BREAST LUMPECTOMY WITH RADIOACTIVE SEED LOCALIZATION Left 09/09/2021   Procedure: LEFT BREAST LUMPECTOMY WITH RADIOACTIVE SEED LOCALIZATION X2;  Surgeon: [name], MD;  Location: [facility];  Service: General;  Laterality: Left;   COLONOSCOPY  2016   in [place]-hx of polyps   EYE SURGERY  2013 torn retina laser   IR THORACENTESIS ASP PLEURAL SPACE W/IMG GUIDE  02/07/2024   IR THORACENTESIS ASP PLEURAL SPACE W/IMG GUIDE  04/12/2024   IR THORACENTESIS ASP PLEURAL SPACE W/IMG GUIDE  05/09/2024   IR THORACENTESIS ASP PLEURAL SPACE W/IMG GUIDE  06/07/2024   IR THORACENTESIS ASP  PLEURAL SPACE W/IMG GUIDE  07/12/2024   IR THORACENTESIS ASP PLEURAL SPACE W/IMG GUIDE  08/21/2024   MENISCUS REPAIR Right 2001   right knee   RIGHT HEART CATH AND CORONARY ANGIOGRAPHY N/A 06/20/2023   Procedure: RIGHT HEART CATH AND CORONARY ANGIOGRAPHY;  Surgeon: Rolan Ezra RAMAN, MD;  Location: [facility];  Service: Cardiovascular;  Laterality: N/A;   thumb surgery Left    TONSILLECTOMY AND ADENOIDECTOMY     TRANSESOPHAGEAL ECHOCARDIOGRAM (CATH LAB) N/A 01/04/2024   Procedure: TRANSESOPHAGEAL ECHOCARDIOGRAM;  Surgeon: Santo Stanly LABOR, MD;  Location: [facility] INVASIVE CV LAB;  Service:  Cardiovascular;  Laterality: N/A;   tuabl ligation     TUBAL LIGATION  1975   WISDOM TOOTH EXTRACTION      Current Medications: Current Meds  Medication Sig   ALPRAZolam  (XANAX ) 0.5 MG tablet Take 1 tablet (0.5 mg total) by mouth at bedtime as needed for anxiety. Take 15-30 minutes prior to the procedure   diazepam  (VALIUM ) 5 MG tablet Take 1 tablet (5 mg total) by mouth as needed for anxiety. Before any invasive procedures or scan   diltiazem  (CARDIZEM  CD) 300 MG 24 hr capsule TAKE 1 CAPSULE EVERY DAY   ezetimibe  (ZETIA ) 10 MG tablet Take 1 tablet (10 mg total) by mouth daily.   losartan  (COZAAR ) 25 MG tablet Take 1 tablet (25 mg total) by mouth daily.   omeprazole  (PRILOSEC) 20 MG capsule TAKE 1 CAPSULE EVERY DAY   ondansetron  (ZOFRAN ) 8 MG tablet Take 1 tablet (8 mg total) by mouth every 8 (eight) hours as needed for nausea or vomiting.   Prenatal Vit-Fe Fumarate-FA (PRENATAL VITAMIN PO) Take by mouth daily. Prenatal w/folic acid po daily   Probiotic Product (ADVANCED PROBIOTIC PO) Take 1 capsule by mouth daily.   rosuvastatin  (CRESTOR ) 20 MG tablet Take 1 tablet (20 mg total) by mouth daily.   tamoxifen  (NOLVADEX ) 10 MG tablet TAKE 1 TABLET EVERY DAY     Allergies:   Morphine, Metoprolol , Nebivolol , and Morphine and codeine   Social History   Socioeconomic History   Marital status:  Widowed    Spouse name: Not on file   Number of children: 1   Years of education: Not on file   Highest education level: Some college, no degree  Occupational History   Occupation: Retired [employer]  Tobacco Use   Smoking status: Former    Current packs/day: 0.00    Average packs/day: 0.3 packs/day for 10.0 years (2.5 ttl pk-yrs)    Types: Cigarettes    Start date: 09/12/1986    Quit date: 09/12/1996    Years since quitting: 28.0   Smokeless tobacco: Never  Vaping Use   Vaping status: Never Used  Substance and Sexual Activity   Alcohol use: Not Currently    Comment: occassional    Drug use: Never   Sexual activity: Not Currently    Birth control/protection: Abstinence, Post-menopausal    Comment: hysterectomy  Other Topics Concern   Not on file  Social History Narrative   Widowed 2018 after 40 years of marriage   One daughter, 2 grandchildren. Lives in [place]    Sister lives in same complex in [place]    Previously lived in [place], [place]   Social Drivers of Health   Tobacco Use: Medium Risk (09/13/2024)   Patient History    Smoking Tobacco Use: Former    Smokeless Tobacco Use: Never    Passive Exposure: Not on file  Financial Resource Strain: Low Risk (07/11/2024)   Overall Financial Resource Strain (CARDIA)    Difficulty of Paying Living Expenses: Not hard at all  Food Insecurity: No Food Insecurity (07/11/2024)   Epic    Worried About Programme Researcher, Broadcasting/film/video in the Last Year: Never true    Ran Out of Food in the Last Year: Never true  Transportation Needs: No Transportation Needs (07/11/2024)   Epic    Lack of Transportation (Medical): No    Lack of Transportation (Non-Medical): No  Physical Activity: Unknown (07/11/2024)   Exercise Vital Sign    Days of Exercise per Week: Patient declined  Minutes of Exercise per Session: Not on file  Stress: Patient Declined (07/11/2024)   [employer] of Occupational Health - Occupational Stress Questionnaire    Feeling of  Stress: Patient declined  Social Connections: Moderately Isolated (07/11/2024)   Social Connection and Isolation Panel    Frequency of Communication with Friends and Family: More than three times a week    Frequency of Social Gatherings with Friends and Family: More than three times a week    Attends Religious Services: Patient declined    Database Administrator or Organizations: Yes    Attends Banker Meetings: More than 4 times per year    Marital Status: Widowed  Depression (PHQ2-9): Low Risk (08/26/2024)   Depression (PHQ2-9)    PHQ-2 Score: 0  Alcohol Screen: Low Risk (07/11/2024)   Alcohol Screen    Last Alcohol Screening Score (AUDIT): 2  Housing: Low Risk (07/11/2024)   Epic    Unable to Pay for Housing in the Last Year: No    Number of Times Moved in the Last Year: 0    Homeless in the Last Year: No  Utilities: Not At Risk (04/25/2024)   Epic    Threatened with loss of utilities: No  Health Literacy: Not on file     Family History: The patient's family history includes Arthritis in her mother, paternal grandmother, and sister; Asthma in her maternal grandfather and paternal grandfather; Breast cancer (age of onset: 103) in her sister; COPD in her paternal grandfather; Diabetes in her sister; Early death in her maternal grandmother; Heart attack in her father; Heart disease in her father, maternal uncle, maternal uncle, and paternal grandmother; High Cholesterol in her sister; High blood pressure in her father, maternal grandmother, mother, paternal grandmother, and sister; Hyperlipidemia in her mother; Hypertension in her father, maternal grandmother, mother, and paternal grandmother; Kidney disease in her maternal grandfather and paternal grandfather; Pancreatic cancer in her maternal aunt; Prostate cancer in her cousin; Stroke in her maternal grandmother, mother, and paternal aunt; Varicose Veins in her mother and sister. There is no history of Colon polyps, Colon  cancer, Esophageal cancer, Rectal cancer, or Stomach cancer.  ROS:   Please see the history of present illness.    ROS  All other systems reviewed and negative.   EKGs/Labs/Other Studies Reviewed:    The following studies were reviewed today: none  Recent Labs: 12/22/2023: TSH 2.23 04/10/2024: B Natriuretic Peptide 33.0 08/26/2024: ALT 50; BUN 15; Creatinine, Ser 1.00; Hemoglobin 13.3; Platelet Count 265; Potassium 4.8; Sodium 140   Recent Lipid Panel    Component Value Date/Time   CHOL 133 12/15/2023 1113   TRIG 211 (H) 12/15/2023 1113   HDL 36 (L) 12/15/2023 1113   CHOLHDL 3.7 12/15/2023 1113   CHOLHDL 3 07/09/2021 0935   VLDL 27.0 07/09/2021 0935   LDLCALC 62 12/15/2023 1113   LDLCALC 55 03/09/2021 0914   LDLDIRECT 68.0 01/14/2020 0904    Physical Exam:    VS:  BP (!) 140/60   Pulse 100   Ht 5' 2 (1.575 m)   Wt 152 lb (68.9 kg)   SpO2 98%   BMI 27.80 kg/m     Wt Readings from Last 3 Encounters:  09/13/24 152 lb (68.9 kg)  08/26/24 154 lb 6.4 oz (70 kg)  08/19/24 150 lb 12.8 oz (68.4 kg)    GEN: Well nourished, well developed in no acute distress HEENT: Normal NECK: No JVD; No carotid bruits LYMPHATICS: No lymphadenopathy CARDIAC:RRR, no  murmurs, rubs, gallops RESPIRATORY:  Clear to auscultation without rales, wheezing or rhonchi  ABDOMEN: Soft, non-tender, non-distended MUSCULOSKELETAL:  No edema; No deformity  SKIN: Warm and dry NEUROLOGIC:  Alert and oriented x 3 PSYCHIATRIC:  Normal affect  ASSESSMENT:    1. Pulmonary HTN (HCC)   2. Coronary artery disease involving native coronary artery of native heart without angina pectoris   3. Hypertension, unspecified type   4. Hyperlipidemia LDL goal <70   5. OSA (obstructive sleep apnea)   6. Atrial tachycardia   7. Tricuspid valve insufficiency, unspecified etiology      PLAN:    In order of problems listed above:  1.  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 -sleep study showed moderate OSA with AHI 18/hr and O2 sats as low as 72%>>now on CPAP followed by Pulmonary -echo 03/2022 showed normalization of PA pressures (RVSP ) with treatment of sleep apnea on CPAP -Repeat echo 05/11/2023 with EF 60 to 65%, normal RV size and function.  Echo read out as severe TR but unable to assess PA pressure due to eccentricity of TR jet.  I reviewed the images and felt the TR was only moderate.  -Cardiac cath 07/02/2023 showed nonobstructive CAD with normal filling pressures and no evidence of pulmonary hypertension  -She appears euvolemic on exam today. She has had some SOB but I suspect it is related to the malignant pleural effusions -I will repeat a 2D echo to reassess RVF as well as rule out pericardial effusion -2D echo 11/30/2023 : EF 65 to 70%, G1 DD, mild RVE, normal RV function, PAP 39.7 mmHg, moderate to severe TR  -continue CPAP therapy -Continue Lasix  40 mg daily PRN with as needed refills -I have personally reviewed and interpreted outside labs performed by patient's PCP which showed serum creatinine 1 and potassium 4.8 on 08/26/2024    2. ASCAD -mild by cath in 2016 in [facility] .   -Nuclear stress test 03/2020 for coronary artery calcifications noted on chest CT year was normal.   -coronary CTA 2023 showed coronary Ca score of 344 with 50-69% mid LAD, 25-49% proximal and distal LAD, <25% proximal to mid LCx and proximal to mid RCA -Cath 07/02/2023 showed 20% proximal to mid RCA, 30% mid left circumflex, 40% ostial to mid LAD and 60% D1 with normal filling pressures -She denies any chest pain or shortness of breath - Continue aspirin  81 mg daily and Crestor  40 mg daily with as needed refills  3.  Hyperlipidemia  -LDL goal < 70 -I have personally reviewed and interpreted outside labs performed by patient's PCP  which showed LDL 62, HDL 36 on 12/15/2023 and ALT 50 on 08/26/2024 -Continue Crestor  20 mg daily and Zetia  10mg  daily with PRN Refills  4.  Hypertension  -BP adequately controlled on exam today but running high at home -Continue Losartan  25mg  daily with PRN Refills -increase Cardizem  to 360mg  daily  -check BP twice daily for a week and call with results  5.  OSA - The patient is tolerating PAP therapy well without any problems. The PAP download performed by his DME was personally reviewed and interpreted by me today and showed an AHI of 0.9 /hr on 9 cm H2O with 20 % compliance in using more than 4 hours nightly.  The patient has been using and benefiting from  PAP use and will continue to benefit from therapy.  - Encouraged her to be more compliant with her device  6. SVT/PVCs/PACs -Followed by EP -Event monitor 06/02/2023 showed SVT consistent with atrial tachycardia lasting as long as 1 minute at a time and some episodes of nonsustained VT lasting up to 7 beats. -her heart only races when she is doing any kind of exertional activity but not at rest.  She is very sedentary and I suspect that this is related to sedentary state and debilitation and not related to arrhythmia as it never occurs at rest -Continue Cardizem  CD 300 mg daily as needed refills  6. Tricuspid regurgitation -2D echo 11/30/2023 showed EF 65 to 70% with G1 DD normal RV function with mildly enlarged RV moderate-severe TR -TEE 12/31/2023: EF 65 to 70% mild MR, severe mid ventricular functional TR, RV moderately enlarged with normal RV function  Followup with me in 6 months   Time Spent: 20 minutes total time of encounter, including 15 minutes spent in face-to-face patient care on the date of this encounter. This time includes coordination of care and counseling regarding above mentioned problem list. Remainder of non-face-to-face time involved reviewing chart documents/testing relevant to the patient encounter and documentation  in the medical record. I have independently reviewed documentation from referring provider  Medication Adjustments/Labs and Tests Ordered: Current medicines are reviewed at length with the patient today.  Concerns regarding medicines are outlined above.  Orders Placed This Encounter  Procedures   EKG 12-Lead    No orders of the defined types were placed in this encounter.   Signed, Wilbert Bihari, MD  09/13/2024 8:34 AM    [facility]  "

## 2024-09-13 NOTE — Addendum Note (Signed)
 Addended by: Neosha Switalski A on: 09/13/2024 09:07 AM   Modules accepted: Orders

## 2024-09-17 NOTE — Progress Notes (Unsigned)
 "     [facility]   [phone] [phone]    Patient Care Team: [name], MD as PCP - General (Family Medicine) [name], MD as PCP - Cardiology (Cardiology) [name], MD as Consulting Physician (Cardiology) [name], MD as Consulting Physician (Ophthalmology) [name], PA-C (Inactive) as Physician Assistant (Dermatology) [name], MD as Consulting Physician (General Surgery) [name], MD as Consulting Physician (Hematology) [name], MD as Attending Physician (Radiation Oncology) [name], [facility] (Inactive) (Pharmacist)   CHIEF COMPLAINT: Folllw up headaches, nosebleeds, and lump on forehead   Oncology History Overview Note   Cancer Staging  Malignant neoplasm of overlapping sites of left breast in female, estrogen receptor positive (HCC) Staging form: Breast, AJCC 8th Edition - Clinical stage from 08/11/2021: Stage IA (cT1c, cN0, cM0, G2, ER+, PR+, HER2-) - Signed by [name], MD on 08/18/2021     Malignant neoplasm of overlapping sites of left breast in female, estrogen receptor positive (HCC)  07/27/2021 Mammogram   EXAM: DIGITAL DIAGNOSTIC UNILATERAL LEFT MAMMOGRAM WITH TOMOSYNTHESIS AND CAD; ULTRASOUND LEFT BREAST LIMITED  IMPRESSION: 1. There is a suspicious mass in the left breast at 9 o'clock measuring 1.8 cm.   2. There is a 1.0 cm group of suspicious linear calcifications anterior to the suspicious mass in the left breast, as well as a faint 2 mm group which lie 1 cm lateral to the linear calcifications.   3.  No evidence of left axillary lymphadenopathy   08/11/2021 Cancer Staging   Staging form: Breast, AJCC 8th Edition - Clinical stage from 08/11/2021: Stage IA (cT1c, cN0, cM0, G2, ER+, PR+, HER2-) - Signed by [name], MD on 08/18/2021 Stage prefix: Initial diagnosis Histologic grading system: 3 grade system   08/11/2021 Initial Biopsy   Diagnosis 1. Breast,  left, needle core biopsy, 9 o'clock, ribbon clip - INVASIVE MAMMARY CARCINOMA - SEE COMMENT 2. Breast, left, needle core biopsy, lower inner quadrant, x clip - INVASIVE MAMMARY CARCINOMA - MAMMARY CARCINOMA IN-SITU - CALCIFICATIONS - SEE COMMENT Microscopic Comment 1. The biopsy material shows an infiltrative proliferation of cells with arranged linearly and in small clusters. Based on the biopsy, the carcinoma appears Nottingham grade 2 of 3 and measures 1.2 cm in greatest linear extent. 2. Based on the biopsy, the carcinoma appears Nottingham grade 2 of 3 and measures 0.2 cm in greatest linear extent.  1. PROGNOSTIC INDICATORS Results: The tumor cells are NEGATIVE for Her2 (1+). Estrogen Receptor: 100%, POSITIVE, STRONG STAINING INTENSITY Progesterone Receptor: 60%, POSITIVE, STRONG STAINING INTENSITY Proliferation Marker Ki67: 10%  2. PROGNOSTIC INDICATORS Results: The tumor cells are NEGATIVE for Her2 (1+). Estrogen Receptor: 100%, POSITIVE, STRONG STAINING INTENSITY Progesterone Receptor: 60%, POSITIVE, STRONG STAINING INTENSITY Proliferation Marker Ki67: 15%   08/13/2021 Mammogram   EXAM: DIGITAL DIAGNOSTIC UNILATERAL RIGHT MAMMOGRAM WITH TOMOSYNTHESIS AND CAD  IMPRESSION: No mammographic evidence for malignancy.   08/16/2021 Initial Diagnosis   Malignant neoplasm of overlapping sites of left breast in female, estrogen receptor positive (HCC)   08/25/2021 Imaging   EXAM: BILATERAL BREAST MRI WITH AND WITHOUT CONTRAST  IMPRESSION: 1. Biopsy-proven invasive ductal carcinoma measuring approximately 2.8 x 1.7 x 1.5 cm in the inner breast at middle depth, associated with the ribbon shaped tissue marking clip placed at the time of core needle biopsy. Enhancement extends approximately 1.4 cm posterior to the clip. 2. 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 3. In combination, the overall enhancement spans approximately 4 cm. 4. No MRI evidence of malignancy involving the RIGHT breast. 5. No pathologic lymphadenopathy.   08/27/2021 Genetic Testing   egative hereditary cancer genetic testing: no pathogenic variants detected in [facility] BRCAPlus Panel and CancerNext-Expanded +RNAinsight Panel.  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  The report dates are 08/27/2021 and 08/30/2021.   The BRCAplus panel offered by [employer] and includes sequencing and deletion/duplication analysis for the following 8 genes: ATM, BRCA1, BRCA2, CDH1, CHEK2, PALB2, PTEN, and TP53.  The CancerNext-Expanded gene panel offered by [facility] and includes sequencing, rearrangement, and RNA analysis for the following 77 genes: AIP, ALK, APC, ATM, AXIN2, BAP1, BARD1, BLM, BMPR1A, BRCA1, BRCA2, BRIP1, CDC73, CDH1, CDK4, CDKN1B, CDKN2A, CHEK2, CTNNA1, DICER1, FANCC, FH, FLCN, GALNT12, KIF1B, LZTR1, MAX, MEN1, MET, MLH1, MSH2, MSH3, MSH6, MUTYH, NBN, NF1, NF2, NTHL1, PALB2, PHOX2B, PMS2, POT1, PRKAR1A, PTCH1, PTEN, RAD51C, RAD51D, RB1, RECQL, RET, SDHA, SDHAF2, SDHB, SDHC, SDHD, SMAD4, SMARCA4, SMARCB1, SMARCE1, STK11, SUFU, TMEM127, TP53, TSC1, TSC2, VHL and XRCC2 (sequencing and deletion/duplication); EGFR, EGLN1, HOXB13, KIT, MITF, PDGFRA, POLD1, and POLE (sequencing only); EPCAM and GREM1 (deletion/duplication only).    09/09/2021 Definitive Surgery   FINAL MICROSCOPIC DIAGNOSIS:   A. BREAST, LEFT, LUMPECTOMY:  -  Invasive ductal carcinoma, Nottingham grade 2 of 3, 3.5 cm  -  Ductal carcinoma in-situ, intermediate grade  -  Margins uninvolved by carcinoma (<0.1 cm; anterior)  -  Previous biopsy site changes present    09/09/2021 Oncotype testing   Oncotype DX was obtained on the final surgical sample and the recurrence score of 21 predicts a risk of recurrence  outside the breast over the next 9 years of 7%, if the patient's only systemic therapy is an antiestrogen for 5 years.  It also predicts no benefit from chemotherapy.   10/20/2021 - 11/16/2021 Radiation Therapy   Site Technique Total Dose (Gy) Dose per Fx (Gy) Completed Fx Beam Energies  Breast, Left: Breast_L_axilla 3D 40.05/40.05 2.67 15/15 10X  Breast, Left: Breast_L_Bst 3D 10/10 2 5/5 6X, 10X     12/06/2021 - 04/2022 Anti-estrogen oral therapy   Tamoxifen  x 5 years unable to tolerate, attempted anastrozole  and had similar side effects.  Opted to forego adjuvant antiestrogen therapy      CURRENT THERAPY: Low dose tamoxifen  for metastatic breast cancer (bone, lung mets)  INTERVAL HISTORY Ms. Madison Clements presents for [facility] visit reporting dull headache, bloody nose, and a lump on her forehead conveyed via Mychart message 09/10/24. This began over the course of 2 weeks after last PET scan (08/16/24). Headaches are dull, frontal over the R eye where she feels a lump with pain score 5/10. The nosebleeds have since subsided but headaches persist. Denies vision change, n/v, dizziness, fall/trauma, anticoagulation, sinus or other infection. She started low dose tamoxifen  yesterday. For the past couple months she takes arthritis strength tylenol  4x daily for knee pain. BPs have been high but has been going on longer than the headache episodes. Also not consistently using CPAP but that is a chronic thing as well.   Separately, she is planning for another thoracentesis next week.   ROS  All other systems reviewed and negative   Past Medical History:  Diagnosis Date   Allergy 1990   Morphine following surgery   Arthritis  generalized   Atypical mole 02/24/2015   RGHT LATERAL THIGH MODERATE   Atypical mole 01/20/2022   Left Breast (mild)   Atypical mole 01/20/2022   Mid Back (mild)   Atypical nevi 02/24/2015   RIGHT NECK MILD   Atypical nevi 08/25/2015   LEFT UPPER ARM MILD   Atypical nevi 08/25/2015    LEFT LATERAL FOREARM MILD/FREE   Atypical nevi 02/23/2016   LEFT THIGH MODERATE/FREE   Atypical nevi 02/23/2016   LEFT MEDIAL LEG MODERATE/FREE   Atypical nevi 02/23/2016   LEFT UPPER BACK MILD /FREE   Atypical nevi 08/23/2016   RIGHT ANT PROX THIGH MILD /FREE   Atypical nevi 07/25/2018   MID LOWER BACK MODERATE   Atypical nevi 01/22/2019   LEFT MID BACK MILD /FREE   Atypical nevi 01/22/2019   LEFT NECK MILD   Atypical nevi 08/06/2019   LEFT INNER BREAST MODERATE W/S   Atypical nevi 08/06/2019   LEFT OUTER SIDE MILD/FREE   Blood transfusion without reported diagnosis    hx of   Breast cancer (HCC) 2022   CAD (coronary artery disease), native coronary artery 02/07/2019   Minimal CAD at the time of cath in 2016 in [place]    Family history of breast cancer 08/19/2021   Family history of pancreatic cancer 08/19/2021   Fatty liver 05/21/2020   Gastric polyps 10/02/2018   EGD 03/2015; benign   GERD (gastroesophageal reflux disease) 10/02/2018   on meds   History of melanoma 10/02/2018   Abdomen 2012; 2013; s/p local excisions.    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 DISTAL LOWER LEG TREATED BY DR. GEORGENE   Sleep apnea    Sleep apnea 05/26/2020   uses CPAP   Tricuspid regurgitation    moderate by echo 04/2023     Past Surgical History:  Procedure Laterality Date   ABDOMINAL HYSTERECTOMY  1990   APPENDECTOMY  1962   BLADDER SUSPENSION  1990   BREAST LUMPECTOMY Left 08/2021   BREAST LUMPECTOMY WITH RADIOACTIVE SEED LOCALIZATION Left 09/09/2021   Procedure: LEFT  BREAST LUMPECTOMY WITH RADIOACTIVE SEED LOCALIZATION X2;  Surgeon: [name], MD;  Location: [facility];  Service: General;  Laterality: Left;   COLONOSCOPY  2016   in [place]-hx of polyps   EYE SURGERY  2013 torn retina laser   IR THORACENTESIS ASP PLEURAL SPACE W/IMG GUIDE  02/07/2024   IR THORACENTESIS ASP PLEURAL SPACE W/IMG GUIDE  04/12/2024   IR THORACENTESIS ASP PLEURAL SPACE W/IMG GUIDE  05/09/2024   IR THORACENTESIS ASP PLEURAL SPACE W/IMG GUIDE  06/07/2024   IR THORACENTESIS ASP PLEURAL SPACE W/IMG GUIDE  07/12/2024   IR THORACENTESIS ASP PLEURAL SPACE W/IMG GUIDE  08/21/2024   MENISCUS REPAIR Right 2001   right knee   RIGHT HEART CATH AND CORONARY ANGIOGRAPHY N/A 06/20/2023   Procedure: RIGHT HEART CATH AND CORONARY ANGIOGRAPHY;  Surgeon: Rolan Ezra RAMAN, MD;  Location: [facility] INVASIVE CV LAB;  Service: Cardiovascular;  Laterality: N/A;   thumb surgery Left    TONSILLECTOMY AND  ADENOIDECTOMY     TRANSESOPHAGEAL ECHOCARDIOGRAM (CATH LAB) N/A 01/04/2024   Procedure: TRANSESOPHAGEAL ECHOCARDIOGRAM;  Surgeon: Santo Stanly LABOR, MD;  Location: [facility] INVASIVE CV LAB;  Service: Cardiovascular;  Laterality: N/A;   tuabl ligation     TUBAL LIGATION  1975   WISDOM TOOTH EXTRACTION       Outpatient Encounter Medications as of 09/18/2024  Medication Sig   ALPRAZolam  (XANAX ) 0.5 MG tablet Take 1 tablet (0.5 mg total) by mouth at bedtime as needed for anxiety. Take 15-30 minutes prior to the procedure   diazepam  (VALIUM ) 5 MG tablet Take 1 tablet (5 mg total) by mouth as needed for anxiety. Before any invasive procedures or scan   diltiazem  (CARDIZEM  CD) 360 MG 24 hr capsule Take 1 capsule (360 mg total) by mouth daily.   ezetimibe  (ZETIA ) 10 MG tablet Take 1 tablet (10 mg total) by mouth daily.   losartan  (COZAAR ) 25 MG tablet Take 1 tablet (25 mg total) by mouth daily.   omeprazole  (PRILOSEC) 20 MG capsule TAKE 1 CAPSULE EVERY DAY   ondansetron  (ZOFRAN ) 8 MG tablet Take 1 tablet  (8 mg total) by mouth every 8 (eight) hours as needed for nausea or vomiting.   Prenatal Vit-Fe Fumarate-FA (PRENATAL VITAMIN PO) Take by mouth daily. Prenatal w/folic acid po daily   Probiotic Product (ADVANCED PROBIOTIC PO) Take 1 capsule by mouth daily.   rosuvastatin  (CRESTOR ) 20 MG tablet Take 1 tablet (20 mg total) by mouth daily.   tamoxifen  (NOLVADEX ) 10 MG tablet TAKE 1 TABLET EVERY DAY   No facility-administered encounter medications on file as of 09/18/2024.     Today's Vitals   09/18/24 0844 09/18/24 0845  BP: (!) 151/47 132/68  Pulse: 69   Resp: 17   Temp: 98 F (36.7 C)   SpO2: 100%   Weight: 156 lb 1.6 oz (70.8 kg)   PainSc:  0-No pain   Body mass index is 28.55 kg/m.   ECOG PERFORMANCE STATUS: 1 - Symptomatic but completely ambulatory  PHYSICAL EXAM GENERAL:alert, no distress and comfortable SKIN: no rash  EYES: sclera clear NECK: without mass LYMPH:  no palpable cervical or supraclavicular lymphadenopathy  LUNGS: clear with normal breathing effort HEART: regular rate & rhythm NEURO: alert & oriented x 3 with fluent speech, no focal motor/sensory deficits. No obvious asymmetry or palpable abnormality at the forehead or skull   CBC    Latest Ref Rng & Units 09/18/2024    8:14 AM 08/26/2024   11:13 AM 08/12/2024   11:07 AM  CBC  WBC 4.0 - 10.5 K/uL 4.0  6.4  5.9   Hemoglobin 12.0 - 15.0 g/dL 86.9  86.6  86.0   Hematocrit 36.0 - 46.0 % 36.4  38.6  39.9   Platelets 150 - 400 K/uL 246  265  252       CMP     Latest Ref Rng & Units 09/18/2024    8:14 AM 08/26/2024   11:13 AM 08/12/2024   11:07 AM  CMP  Glucose 70 - 99 mg/dL 898  96  895   BUN 8 - 23 mg/dL 18  15  16    Creatinine 0.44 - 1.00 mg/dL 8.89  8.99  9.12   Sodium 135 - 145 mmol/L 139  140  138   Potassium 3.5 - 5.1 mmol/L 4.3  4.8  4.3   Chloride 98 - 111 mmol/L 105  107  104   CO2 22 - 32 mmol/L 21  22  23   Calcium  8.9 - 10.3 mg/dL 9.9  9.4  9.7   Total Protein 6.5 - 8.1 g/dL 7.0  6.5  7.0    Total Bilirubin 0.0 - 1.2 mg/dL 0.7  0.5  0.9   Alkaline Phos 38 - 126 U/L 83  66  75   AST 15 - 41 U/L 34  33  37   ALT 0 - 44 U/L 34  50  41       ASSESSMENT & PLAN: 79 year old female    Headaches, lump on forehead  -Onset after last PET scan 08/12/24 which showed no skull mets  -No precipitating event or infection -Chronic issues such as elevated BP and OSA not likely contributory  -Began before starting Tamoxifen , no other med changes  -Could be med overuse/rebound headaches from daily arthritic strength tylenol , encouraged to reduce -Exam is not impressive, neuro nonfocal -Given her metastatic breast cancer, will proceed with MRI to make sure we are not missing brain metastasis   -Refer to neuro Onc Dr. Buckley if needed -F/up pending MRI results   Epistaxis -Self limiting, no evidence of sinusitis. Plts normal. Not on anticoagulation -Resolved   Malignant neoplasm of overlapping sites of left breast in female, estrogen receptor positive, Stage IA, pT2, cN0, ER+/PR+/HER2-, Grade 2, RS 21, malignant pleural effusion in 12/2023 -she was under short-term f/u for left breast asymmetry. Biopsy 08/11/21 confirmed invasive mammary carcinoma. -left lumpectomy on 09/09/21 by Dr. 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 No nodes were removed due to her age. -she received adjuvant RT, completed in 11/2021 -she started adjuvant tamoxifen  in 12/2021, but could not tolerate. She subsequently tried anastrozole  and still could not tolerate. She stopped in 04/2022 -Screening mammogram in May 2024 was negative. - She developed pleural effusion in April 2025, cytology unfortunately showed malignant cells, consistent with breast primary. ERand PR moderately positive, Her2 negative (IHC 0) -FDG PET 01/08/2024 was negative for hypermetabolic lesion or hypermetabolism around the pleura.  [place]  PET scan 01/26/2024 showed diffuse bone mets and mediastinal adenopathy. Pleura was negative on  PET -her skin (scalp) biopsy also showed metastatic breast cancer, ER and PR positive, HER2 negative.   -She has started anastrozole  in 12/2023  - Due to her recurrent pleural effusion, and high disease burden, low-dose Ibrance  for better disease control started 02/12/24 (75 mg daily 3 weeks on/1 week off) - Continue Zometa , will change to every 3 months due to her diffuse bone metastasis  -Due to severe joint pain, especially in her knees, stopped anastrozole  and ibrance  in 03/2024 -[place]  PET 08/12/24 showed decreased estradiol uptake in the skeleton, no new sites of disease, and stable large left pleural effusion -Started low dose Tamoxifen  09/17/2024   PLAN: -Recent [place]  PET and today's labs reviewed -Brain MRI in the next week, will call with results -Reduce tylenol  use as tolerated -Consider referral to Dr. Buckley for headaches pending MRI results -Next scheduled onc visit 11/20/24. Continue low dose Tamoxifen  -Available to see her sooner if needed  Orders Placed This Encounter  Procedures   MR Brain W Wo Contrast    Standing Status:   Future    Expected Date:   09/23/2024    Expiration Date:   09/18/2025    If indicated for the ordered procedure, I authorize the administration of contrast media per Radiology protocol:   Yes    What is the patient's sedation requirement?:   Anti-anxiety    Does the patient have  a pacemaker or implanted devices?:   No    Use SRS Protocol?:   No    Preferred imaging location?:   [facility] (table limit - 500lbs)      All questions were answered. The patient knows to call the clinic with any problems, questions or concerns. No barriers to learning were detected. I spent 20 minutes counseling the patient face to face. The total time spent in the appointment was 30 minutes and more than 50% was on counseling, review of test results, and coordination of care.   Darcy Cordner K Avia Merkley, NP 09/18/2024   "

## 2024-09-18 ENCOUNTER — Inpatient Hospital Stay

## 2024-09-18 ENCOUNTER — Encounter: Payer: Self-pay | Admitting: Nurse Practitioner

## 2024-09-18 ENCOUNTER — Inpatient Hospital Stay: Attending: Hematology | Admitting: Nurse Practitioner

## 2024-09-18 VITALS — BP 132/68 | HR 69 | Temp 98.0°F | Resp 17 | Wt 156.1 lb

## 2024-09-18 DIAGNOSIS — J910 Malignant pleural effusion: Secondary | ICD-10-CM | POA: Insufficient documentation

## 2024-09-18 DIAGNOSIS — Z800 Family history of malignant neoplasm of digestive organs: Secondary | ICD-10-CM | POA: Insufficient documentation

## 2024-09-18 DIAGNOSIS — Z1721 Progesterone receptor positive status: Secondary | ICD-10-CM | POA: Insufficient documentation

## 2024-09-18 DIAGNOSIS — R519 Headache, unspecified: Secondary | ICD-10-CM | POA: Diagnosis not present

## 2024-09-18 DIAGNOSIS — Z1732 Human epidermal growth factor receptor 2 negative status: Secondary | ICD-10-CM | POA: Insufficient documentation

## 2024-09-18 DIAGNOSIS — C50812 Malignant neoplasm of overlapping sites of left female breast: Secondary | ICD-10-CM | POA: Insufficient documentation

## 2024-09-18 DIAGNOSIS — Z170 Estrogen receptor positive status [ER+]: Secondary | ICD-10-CM

## 2024-09-18 DIAGNOSIS — Z90710 Acquired absence of both cervix and uterus: Secondary | ICD-10-CM | POA: Diagnosis not present

## 2024-09-18 DIAGNOSIS — D519 Vitamin B12 deficiency anemia, unspecified: Secondary | ICD-10-CM

## 2024-09-18 DIAGNOSIS — R040 Epistaxis: Secondary | ICD-10-CM | POA: Insufficient documentation

## 2024-09-18 DIAGNOSIS — Z803 Family history of malignant neoplasm of breast: Secondary | ICD-10-CM | POA: Insufficient documentation

## 2024-09-18 DIAGNOSIS — Z79810 Long term (current) use of selective estrogen receptor modulators (SERMs): Secondary | ICD-10-CM | POA: Diagnosis not present

## 2024-09-18 DIAGNOSIS — C7951 Secondary malignant neoplasm of bone: Secondary | ICD-10-CM | POA: Insufficient documentation

## 2024-09-18 DIAGNOSIS — Z923 Personal history of irradiation: Secondary | ICD-10-CM | POA: Insufficient documentation

## 2024-09-18 DIAGNOSIS — C792 Secondary malignant neoplasm of skin: Secondary | ICD-10-CM | POA: Insufficient documentation

## 2024-09-18 DIAGNOSIS — D649 Anemia, unspecified: Secondary | ICD-10-CM

## 2024-09-18 DIAGNOSIS — C7800 Secondary malignant neoplasm of unspecified lung: Secondary | ICD-10-CM | POA: Insufficient documentation

## 2024-09-18 LAB — COMPREHENSIVE METABOLIC PANEL WITH GFR
ALT: 34 U/L (ref 0–44)
AST: 34 U/L (ref 15–41)
Albumin: 4.6 g/dL (ref 3.5–5.0)
Alkaline Phosphatase: 83 U/L (ref 38–126)
Anion gap: 13 (ref 5–15)
BUN: 18 mg/dL (ref 8–23)
CO2: 21 mmol/L — ABNORMAL LOW (ref 22–32)
Calcium: 9.9 mg/dL (ref 8.9–10.3)
Chloride: 105 mmol/L (ref 98–111)
Creatinine, Ser: 1.1 mg/dL — ABNORMAL HIGH (ref 0.44–1.00)
GFR, Estimated: 51 mL/min — ABNORMAL LOW
Glucose, Bld: 101 mg/dL — ABNORMAL HIGH (ref 70–99)
Potassium: 4.3 mmol/L (ref 3.5–5.1)
Sodium: 139 mmol/L (ref 135–145)
Total Bilirubin: 0.7 mg/dL (ref 0.0–1.2)
Total Protein: 7 g/dL (ref 6.5–8.1)

## 2024-09-18 LAB — CBC WITH DIFFERENTIAL (CANCER CENTER ONLY)
Abs Immature Granulocytes: 0.04 K/uL (ref 0.00–0.07)
Basophils Absolute: 0 K/uL (ref 0.0–0.1)
Basophils Relative: 1 %
Eosinophils Absolute: 0 K/uL (ref 0.0–0.5)
Eosinophils Relative: 1 %
HCT: 36.4 % (ref 36.0–46.0)
Hemoglobin: 13 g/dL (ref 12.0–15.0)
Immature Granulocytes: 1 %
Lymphocytes Relative: 28 %
Lymphs Abs: 1.1 K/uL (ref 0.7–4.0)
MCH: 33.3 pg (ref 26.0–34.0)
MCHC: 35.7 g/dL (ref 30.0–36.0)
MCV: 93.3 fL (ref 80.0–100.0)
Monocytes Absolute: 0.5 K/uL (ref 0.1–1.0)
Monocytes Relative: 13 %
Neutro Abs: 2.3 K/uL (ref 1.7–7.7)
Neutrophils Relative %: 56 %
Platelet Count: 246 K/uL (ref 150–400)
RBC: 3.9 MIL/uL (ref 3.87–5.11)
RDW: 13 % (ref 11.5–15.5)
WBC Count: 4 K/uL (ref 4.0–10.5)
nRBC: 0 % (ref 0.0–0.2)

## 2024-09-22 ENCOUNTER — Other Ambulatory Visit: Payer: Self-pay | Admitting: Cardiology

## 2024-09-23 ENCOUNTER — Ambulatory Visit (HOSPITAL_COMMUNITY)
Admission: RE | Admit: 2024-09-23 | Discharge: 2024-09-23 | Disposition: A | Discharge status: Home / Self Care | Source: Ambulatory Visit | Attending: Nurse Practitioner | Admitting: Nurse Practitioner

## 2024-09-23 DIAGNOSIS — R519 Headache, unspecified: Secondary | ICD-10-CM | POA: Insufficient documentation

## 2024-09-23 DIAGNOSIS — Z170 Estrogen receptor positive status [ER+]: Secondary | ICD-10-CM | POA: Diagnosis present

## 2024-09-23 DIAGNOSIS — C50812 Malignant neoplasm of overlapping sites of left female breast: Secondary | ICD-10-CM | POA: Insufficient documentation

## 2024-09-23 MED ORDER — GADOBUTROL 1 MMOL/ML IV SOLN
7.0000 mL | Freq: Once | INTRAVENOUS | Status: AC | PRN
  Administered 2024-09-23: 7 mL via INTRAVENOUS

## 2024-09-24 ENCOUNTER — Other Ambulatory Visit: Payer: Self-pay | Admitting: Cardiology

## 2024-09-25 ENCOUNTER — Encounter: Payer: Self-pay | Admitting: Cardiology

## 2024-09-25 MED ORDER — LOSARTAN POTASSIUM 25 MG PO TABS: 25.0000 mg | ORAL_TABLET | Freq: Every day | ORAL | 3 refills | Status: AC

## 2024-09-27 ENCOUNTER — Other Ambulatory Visit: Payer: Self-pay | Admitting: Hematology

## 2024-09-27 ENCOUNTER — Ambulatory Visit (HOSPITAL_COMMUNITY)
Admission: RE | Admit: 2024-09-27 | Discharge: 2024-09-27 | Disposition: A | Discharge status: Home / Self Care | Source: Ambulatory Visit | Attending: Urology | Admitting: Urology

## 2024-09-27 ENCOUNTER — Other Ambulatory Visit (HOSPITAL_COMMUNITY): Payer: Self-pay | Admitting: Urology

## 2024-09-27 ENCOUNTER — Ambulatory Visit (HOSPITAL_COMMUNITY)
Admission: RE | Admit: 2024-09-27 | Discharge: 2024-09-27 | Disposition: A | Discharge status: Home / Self Care | Source: Ambulatory Visit | Attending: Hematology | Admitting: Hematology

## 2024-09-27 DIAGNOSIS — Z170 Estrogen receptor positive status [ER+]: Secondary | ICD-10-CM | POA: Diagnosis not present

## 2024-09-27 DIAGNOSIS — D519 Vitamin B12 deficiency anemia, unspecified: Secondary | ICD-10-CM

## 2024-09-27 DIAGNOSIS — E041 Nontoxic single thyroid nodule: Secondary | ICD-10-CM

## 2024-09-27 DIAGNOSIS — Z9889 Other specified postprocedural states: Secondary | ICD-10-CM

## 2024-09-27 DIAGNOSIS — J910 Malignant pleural effusion: Secondary | ICD-10-CM | POA: Diagnosis not present

## 2024-09-27 DIAGNOSIS — C50812 Malignant neoplasm of overlapping sites of left female breast: Secondary | ICD-10-CM | POA: Diagnosis present

## 2024-09-27 HISTORY — PX: IR THORACENTESIS RIGHT ASP PLEURAL SPACE W/IMG GUIDE: IMG5380

## 2024-09-27 MED ORDER — LIDOCAINE HCL 1 % IJ SOLN
INTRAMUSCULAR | Status: AC
  Filled 2024-09-27: qty 20

## 2024-09-27 MED ORDER — LIDOCAINE HCL 1 % IJ SOLN
20.0000 mL | Freq: Once | INTRAMUSCULAR | Status: AC
  Administered 2024-09-27: 10 mL

## 2024-09-27 NOTE — Procedures (Signed)
 PROCEDURE SUMMARY:  Successful image-guided therapeutic left-sided thoracentesis. Yielded 0.450 liters of clear, serous pleural fluid. Patient tolerated procedure well. EBL: Zero No immediate complications.  Post procedure CXR shows no pneumothorax.  Please see imaging section of Epic for full dictation.  [place] LABOR Arya Boxley PA-C 09/27/2024 11:49 AM

## 2024-09-30 ENCOUNTER — Encounter: Payer: Self-pay | Admitting: Family Medicine

## 2024-10-01 ENCOUNTER — Other Ambulatory Visit: Payer: Self-pay | Admitting: Nurse Practitioner

## 2024-10-01 ENCOUNTER — Ambulatory Visit: Payer: Self-pay | Admitting: Nurse Practitioner

## 2024-10-01 DIAGNOSIS — Z170 Estrogen receptor positive status [ER+]: Secondary | ICD-10-CM

## 2024-10-01 DIAGNOSIS — R519 Headache, unspecified: Secondary | ICD-10-CM

## 2024-10-12 ENCOUNTER — Encounter: Payer: Self-pay | Admitting: Internal Medicine

## 2024-10-13 ENCOUNTER — Telehealth

## 2024-10-13 DIAGNOSIS — R300 Dysuria: Secondary | ICD-10-CM | POA: Diagnosis not present

## 2024-10-13 DIAGNOSIS — R399 Unspecified symptoms and signs involving the genitourinary system: Secondary | ICD-10-CM

## 2024-10-13 MED ORDER — CEPHALEXIN 500 MG PO CAPS: 500.0000 mg | ORAL_CAPSULE | Freq: Two times a day (BID) | ORAL | 0 refills | Status: AC

## 2024-10-13 NOTE — Progress Notes (Signed)
 " Virtual Visit Consent   Madison Clements, you are scheduled for a virtual visit with a [facility] provider today. Just as with appointments in the office, your consent must be obtained to participate. Your consent will be active for this visit and any virtual visit you may have with one of our providers in the next 365 days. If you have a MyChart account, a copy of this consent can be sent to you electronically.  As this is a virtual visit, video technology does not allow for your provider to perform a traditional examination. This may limit your provider's ability to fully assess your condition. If your provider identifies any concerns that need to be evaluated in person or the need to arrange testing (such as labs, EKG, etc.), we will make arrangements to do so. Although advances in technology are sophisticated, we cannot ensure that it will always work on either your end or our end. If the connection with a video visit is poor, the visit may have to be switched to a telephone visit. With either a video or telephone visit, we are not always able to ensure that we have a secure connection.  By engaging in this virtual visit, you consent to the provision of healthcare and authorize for your insurance to be billed (if applicable) for the services provided during this visit. Depending on your insurance coverage, you may receive a charge related to this service.  I need to obtain your verbal consent now. Are you willing to proceed with your visit today? Madison Clements has provided verbal consent on 10/13/2024 for a virtual visit (video or telephone). Bari Learn, FNP  Date: 10/13/2024 8:34 AM   Virtual Visit via Video Note   I, Bari Learn, connected with  Madison Clements  ([id number], June 17, 1946) on 10/13/24 at  8:30 AM EST by a video-enabled telemedicine application and verified that I am speaking with the correct person using two identifiers.  Location: Patient: Virtual Visit  Location Patient: Home Provider: Virtual Visit Location Provider: Home Office   I discussed the limitations of evaluation and management by telemedicine and the availability of in person appointments. The patient expressed understanding and agreed to proceed.    History of Present Illness: Madison Clements is a 79 y.o. who identifies as a female who was assigned female at birth, and is being seen today for UTI symptoms that started yesterday. Did a home UTI test and it was positive yesterday.   HPI: Dysuria  This is a new problem. The current episode started yesterday. The problem occurs intermittently. The problem has been waxing and waning. The quality of the pain is described as burning. The pain is at a severity of 9/10. Associated symptoms include frequency, hesitancy and urgency. Pertinent negatives include no hematuria, nausea or vomiting. She has tried increased fluids for the symptoms. The treatment provided mild relief.    Problems:  Patient Active Problem List   Diagnosis Date Noted   B12 deficiency anemia 10/29/2023   Tricuspid regurgitation    Primary osteoarthritis of right shoulder 04/11/2023   Trochanteric bursitis of both hips 04/11/2023   Atrial tachycardia 02/09/2023   Osteopenia 09/19/2022   SVT (supraventricular tachycardia) 07/11/2022   Genetic testing 08/30/2021   Family history of breast cancer 08/19/2021   Family history of pancreatic cancer 08/19/2021   Malignant neoplasm of overlapping sites of left breast in female, estrogen receptor positive (HCC) 08/16/2021   Hepatic steatosis 07/28/2020   OSA (obstructive sleep apnea) 05/26/2020  Coronary artery disease involving native coronary artery of native heart without angina pectoris 02/07/2019   GERD (gastroesophageal reflux disease) 10/02/2018   Essential hypertension 10/02/2018   Mixed hyperlipidemia 10/02/2018   Osteoporosis of forearm 10/02/2018   Primary osteoarthritis involving multiple joints  10/02/2018   Pulmonary hypertension, primary (HCC) 10/02/2018   History of melanoma 10/02/2018   Polyp of colon, adenomatous 10/02/2018   Gastric polyps 10/02/2018    Allergies: Allergies[1] Medications: Current Medications[2]  Observations/Objective: Patient is well-developed, well-nourished in no acute distress.  Resting comfortably  at home.  Head is normocephalic, atraumatic.  No labored breathing.  Speech is clear and coherent with logical content.  Patient is alert and oriented at baseline.    Assessment and Plan: 1. Dysuria (Primary) - cephALEXin  (KEFLEX ) 500 MG capsule; Take 1 capsule (500 mg total) by mouth 2 (two) times daily.  Dispense: 14 capsule; Refill: 0  2. UTI symptoms - cephALEXin  (KEFLEX ) 500 MG capsule; Take 1 capsule (500 mg total) by mouth 2 (two) times daily.  Dispense: 14 capsule; Refill: 0  Force fluids AZO over the counter X2 days Follow up in person if symptoms worsen or do not improve   Follow Up Instructions: I discussed the assessment and treatment plan with the patient. The patient was provided an opportunity to ask questions and all were answered. The patient agreed with the plan and demonstrated an understanding of the instructions.  A copy of instructions were sent to the patient via MyChart unless otherwise noted below.     The patient was advised to call back or seek an in-person evaluation if the symptoms worsen or if the condition fails to improve as anticipated.    Bari Learn, FNP    [1]  Allergies Allergen Reactions   Morphine Rash and Nausea And Vomiting   Metoprolol  Other (See Comments)   Nebivolol  Other (See Comments)    bradycardia   Morphine And Codeine Hives, Dermatitis and Rash  [2]  Current Outpatient Medications:    cephALEXin  (KEFLEX ) 500 MG capsule, Take 1 capsule (500 mg total) by mouth 2 (two) times daily., Disp: 14 capsule, Rfl: 0   ALPRAZolam  (XANAX ) 0.5 MG tablet, Take 1 tablet (0.5 mg total) by mouth at  bedtime as needed for anxiety. Take 15-30 minutes prior to the procedure, Disp: 30 tablet, Rfl: 0   diazepam  (VALIUM ) 5 MG tablet, Take 1 tablet (5 mg total) by mouth as needed for anxiety. Before any invasive procedures or scan, Disp: 10 tablet, Rfl: 0   diltiazem  (CARDIZEM  CD) 360 MG 24 hr capsule, Take 1 capsule (360 mg total) by mouth daily., Disp: 90 capsule, Rfl: 3   ezetimibe  (ZETIA ) 10 MG tablet, TAKE 1 TABLET (10 MG TOTAL) BY MOUTH DAILY., Disp: 90 tablet, Rfl: 3   losartan  (COZAAR ) 25 MG tablet, Take 1 tablet (25 mg total) by mouth daily., Disp: 90 tablet, Rfl: 3   omeprazole  (PRILOSEC) 20 MG capsule, TAKE 1 CAPSULE EVERY DAY, Disp: 90 capsule, Rfl: 3   ondansetron  (ZOFRAN ) 8 MG tablet, Take 1 tablet (8 mg total) by mouth every 8 (eight) hours as needed for nausea or vomiting., Disp: 20 tablet, Rfl: 2   Prenatal Vit-Fe Fumarate-FA (PRENATAL VITAMIN PO), Take by mouth daily. Prenatal w/folic acid po daily, Disp: , Rfl:    Probiotic Product (ADVANCED PROBIOTIC PO), Take 1 capsule by mouth daily., Disp: , Rfl:    rosuvastatin  (CRESTOR ) 20 MG tablet, TAKE 1 TABLET (20 MG TOTAL) BY MOUTH DAILY. (DOSE DECREASE), Disp:  90 tablet, Rfl: 3   tamoxifen  (NOLVADEX ) 10 MG tablet, TAKE 1 TABLET EVERY DAY, Disp: 30 tablet, Rfl: 11  "

## 2024-10-14 ENCOUNTER — Inpatient Hospital Stay: Admitting: Internal Medicine

## 2024-10-15 ENCOUNTER — Telehealth: Payer: Self-pay | Admitting: Cardiology

## 2024-10-15 ENCOUNTER — Ambulatory Visit (HOSPITAL_COMMUNITY)
Admission: RE | Admit: 2024-10-15 | Discharge: 2024-10-15 | Disposition: A | Source: Ambulatory Visit | Attending: Cardiology | Admitting: Cardiology

## 2024-10-15 DIAGNOSIS — R0600 Dyspnea, unspecified: Secondary | ICD-10-CM | POA: Diagnosis not present

## 2024-10-15 LAB — ECHOCARDIOGRAM COMPLETE
Area-P 1/2: 3.72 cm2
S' Lateral: 2.6 cm

## 2024-10-16 NOTE — Telephone Encounter (Signed)
 BP readings on Dr. Dorine desk.

## 2024-10-17 ENCOUNTER — Inpatient Hospital Stay: Attending: Hematology | Admitting: Hematology and Oncology

## 2024-10-17 ENCOUNTER — Inpatient Hospital Stay

## 2024-10-21 ENCOUNTER — Inpatient Hospital Stay: Admitting: Internal Medicine

## 2024-10-31 ENCOUNTER — Inpatient Hospital Stay: Attending: Hematology | Admitting: Internal Medicine

## 2024-11-20 ENCOUNTER — Inpatient Hospital Stay

## 2024-11-20 ENCOUNTER — Inpatient Hospital Stay: Attending: Hematology | Admitting: Hematology

## 2024-12-25 ENCOUNTER — Ambulatory Visit: Admitting: "Endocrinology
